# Patient Record
Sex: Female | Born: 1945 | Race: Black or African American | Hispanic: No | Smoking: Current some day smoker
Health system: Southern US, Community
[De-identification: ages and names within clinical notes are randomized; demographics above are authoritative.]

## PROBLEM LIST (undated history)

## (undated) DIAGNOSIS — L0390 Cellulitis, unspecified: Secondary | ICD-10-CM

## (undated) DIAGNOSIS — M7989 Other specified soft tissue disorders: Secondary | ICD-10-CM

## (undated) DIAGNOSIS — I509 Heart failure, unspecified: Secondary | ICD-10-CM

## (undated) DIAGNOSIS — M549 Dorsalgia, unspecified: Secondary | ICD-10-CM

## (undated) DIAGNOSIS — I5032 Chronic diastolic (congestive) heart failure: Secondary | ICD-10-CM

## (undated) DIAGNOSIS — K2100 Gastro-esophageal reflux disease with esophagitis, without bleeding: Secondary | ICD-10-CM

## (undated) DIAGNOSIS — I739 Peripheral vascular disease, unspecified: Secondary | ICD-10-CM

## (undated) DIAGNOSIS — Z8719 Personal history of other diseases of the digestive system: Secondary | ICD-10-CM

## (undated) DIAGNOSIS — M109 Gout, unspecified: Secondary | ICD-10-CM

## (undated) DIAGNOSIS — K219 Gastro-esophageal reflux disease without esophagitis: Secondary | ICD-10-CM

## (undated) DIAGNOSIS — Z5189 Encounter for other specified aftercare: Secondary | ICD-10-CM

## (undated) DIAGNOSIS — I10 Essential (primary) hypertension: Secondary | ICD-10-CM

## (undated) DIAGNOSIS — J449 Chronic obstructive pulmonary disease, unspecified: Secondary | ICD-10-CM

## (undated) DIAGNOSIS — D649 Anemia, unspecified: Secondary | ICD-10-CM

## (undated) DIAGNOSIS — J189 Pneumonia, unspecified organism: Secondary | ICD-10-CM

## (undated) DIAGNOSIS — D374 Neoplasm of uncertain behavior of colon: Secondary | ICD-10-CM

## (undated) DIAGNOSIS — K759 Inflammatory liver disease, unspecified: Secondary | ICD-10-CM

## (undated) DIAGNOSIS — F419 Anxiety disorder, unspecified: Secondary | ICD-10-CM

## (undated) DIAGNOSIS — IMO0001 Reserved for inherently not codable concepts without codable children: Secondary | ICD-10-CM

## (undated) DIAGNOSIS — K635 Polyp of colon: Secondary | ICD-10-CM

## (undated) DIAGNOSIS — F329 Major depressive disorder, single episode, unspecified: Secondary | ICD-10-CM

## (undated) DIAGNOSIS — I824Y9 Acute embolism and thrombosis of unspecified deep veins of unspecified proximal lower extremity: Secondary | ICD-10-CM

## (undated) DIAGNOSIS — M1990 Unspecified osteoarthritis, unspecified site: Secondary | ICD-10-CM

## (undated) DIAGNOSIS — C221 Intrahepatic bile duct carcinoma: Secondary | ICD-10-CM

## (undated) DIAGNOSIS — K922 Gastrointestinal hemorrhage, unspecified: Secondary | ICD-10-CM

## (undated) DIAGNOSIS — F32A Depression, unspecified: Secondary | ICD-10-CM

## (undated) DIAGNOSIS — Z9981 Dependence on supplemental oxygen: Secondary | ICD-10-CM

## (undated) DIAGNOSIS — K7030 Alcoholic cirrhosis of liver without ascites: Secondary | ICD-10-CM

## (undated) HISTORY — DX: Heart failure, unspecified: I50.9

## (undated) HISTORY — DX: Anemia, unspecified: D64.9

## (undated) HISTORY — DX: Cellulitis, unspecified: L03.90

## (undated) HISTORY — DX: Major depressive disorder, single episode, unspecified: F32.9

## (undated) HISTORY — DX: Other specified soft tissue disorders: M79.89

## (undated) HISTORY — DX: Gastro-esophageal reflux disease with esophagitis: K21.0

## (undated) HISTORY — DX: Gastro-esophageal reflux disease without esophagitis: K21.9

## (undated) HISTORY — PX: ABDOMINAL HYSTERECTOMY: SHX81

## (undated) HISTORY — DX: Gout, unspecified: M10.9

## (undated) HISTORY — DX: Neoplasm of uncertain behavior of colon: D37.4

## (undated) HISTORY — DX: Acute embolism and thrombosis of unspecified deep veins of unspecified proximal lower extremity: I82.4Y9

## (undated) HISTORY — PX: COLONOSCOPY: SHX174

## (undated) HISTORY — DX: Depression, unspecified: F32.A

## (undated) HISTORY — DX: Reserved for inherently not codable concepts without codable children: IMO0001

## (undated) HISTORY — DX: Personal history of other diseases of the digestive system: Z87.19

## (undated) HISTORY — DX: Essential (primary) hypertension: I10

## (undated) HISTORY — DX: Unspecified osteoarthritis, unspecified site: M19.90

## (undated) HISTORY — DX: Chronic obstructive pulmonary disease, unspecified: J44.9

## (undated) HISTORY — DX: Encounter for other specified aftercare: Z51.89

## (undated) HISTORY — DX: Dorsalgia, unspecified: M54.9

## (undated) HISTORY — DX: Anxiety disorder, unspecified: F41.9

## (undated) HISTORY — DX: Alcoholic cirrhosis of liver without ascites: K70.30

## (undated) HISTORY — DX: Peripheral vascular disease, unspecified: I73.9

## (undated) HISTORY — DX: Gastro-esophageal reflux disease with esophagitis, without bleeding: K21.00

## (undated) HISTORY — DX: Pneumonia, unspecified organism: J18.9

## (undated) HISTORY — PX: CATARACT EXTRACTION: SUR2

## (undated) HISTORY — DX: Polyp of colon: K63.5

---
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
## 1999-05-05 ENCOUNTER — Encounter: Admission: RE | Admit: 1999-05-05 | Discharge: 1999-06-08 | Payer: Self-pay | Admitting: Family Medicine

## 2000-04-26 ENCOUNTER — Encounter: Admission: RE | Admit: 2000-04-26 | Discharge: 2000-04-26 | Payer: Self-pay | Admitting: *Deleted

## 2000-05-05 ENCOUNTER — Ambulatory Visit (HOSPITAL_COMMUNITY): Admission: RE | Admit: 2000-05-05 | Discharge: 2000-05-05 | Payer: Self-pay | Admitting: Internal Medicine

## 2000-05-05 ENCOUNTER — Encounter: Payer: Self-pay | Admitting: Internal Medicine

## 2000-11-19 ENCOUNTER — Inpatient Hospital Stay (HOSPITAL_COMMUNITY): Admission: EM | Admit: 2000-11-19 | Discharge: 2000-11-23 | Payer: Self-pay

## 2001-01-26 ENCOUNTER — Encounter: Payer: Self-pay | Admitting: Emergency Medicine

## 2001-01-26 ENCOUNTER — Inpatient Hospital Stay (HOSPITAL_COMMUNITY): Admission: EM | Admit: 2001-01-26 | Discharge: 2001-01-30 | Payer: Self-pay | Admitting: Emergency Medicine

## 2001-01-27 ENCOUNTER — Encounter: Payer: Self-pay | Admitting: Internal Medicine

## 2001-01-28 ENCOUNTER — Encounter: Payer: Self-pay | Admitting: Internal Medicine

## 2001-01-29 ENCOUNTER — Encounter: Payer: Self-pay | Admitting: Internal Medicine

## 2001-01-30 ENCOUNTER — Encounter: Payer: Self-pay | Admitting: Emergency Medicine

## 2002-01-06 ENCOUNTER — Emergency Department (HOSPITAL_COMMUNITY): Admission: EM | Admit: 2002-01-06 | Discharge: 2002-01-06 | Payer: Self-pay | Admitting: Emergency Medicine

## 2002-06-11 ENCOUNTER — Ambulatory Visit (HOSPITAL_COMMUNITY): Admission: RE | Admit: 2002-06-11 | Discharge: 2002-06-11 | Payer: Self-pay | Admitting: Family Medicine

## 2002-06-11 ENCOUNTER — Encounter: Payer: Self-pay | Admitting: Family Medicine

## 2002-08-01 ENCOUNTER — Encounter: Admission: RE | Admit: 2002-08-01 | Discharge: 2002-09-03 | Payer: Self-pay | Admitting: Internal Medicine

## 2002-09-10 ENCOUNTER — Encounter: Admission: RE | Admit: 2002-09-10 | Discharge: 2002-12-09 | Payer: Self-pay | Admitting: Internal Medicine

## 2002-10-31 ENCOUNTER — Encounter: Payer: Self-pay | Admitting: Internal Medicine

## 2002-10-31 ENCOUNTER — Inpatient Hospital Stay (HOSPITAL_COMMUNITY): Admission: AD | Admit: 2002-10-31 | Discharge: 2002-11-08 | Payer: Self-pay | Admitting: Internal Medicine

## 2002-11-04 ENCOUNTER — Encounter: Payer: Self-pay | Admitting: Internal Medicine

## 2002-11-04 ENCOUNTER — Encounter (INDEPENDENT_AMBULATORY_CARE_PROVIDER_SITE_OTHER): Payer: Self-pay | Admitting: *Deleted

## 2003-04-26 ENCOUNTER — Encounter: Payer: Self-pay | Admitting: Emergency Medicine

## 2003-04-26 ENCOUNTER — Emergency Department (HOSPITAL_COMMUNITY): Admission: EM | Admit: 2003-04-26 | Discharge: 2003-04-26 | Payer: Self-pay | Admitting: Emergency Medicine

## 2003-05-14 ENCOUNTER — Inpatient Hospital Stay (HOSPITAL_COMMUNITY): Admission: EM | Admit: 2003-05-14 | Discharge: 2003-05-19 | Payer: Self-pay | Admitting: Emergency Medicine

## 2003-05-16 ENCOUNTER — Encounter: Payer: Self-pay | Admitting: Internal Medicine

## 2003-06-03 ENCOUNTER — Emergency Department (HOSPITAL_COMMUNITY): Admission: EM | Admit: 2003-06-03 | Discharge: 2003-06-04 | Payer: Self-pay | Admitting: Emergency Medicine

## 2003-06-05 ENCOUNTER — Emergency Department (HOSPITAL_COMMUNITY): Admission: EM | Admit: 2003-06-05 | Discharge: 2003-06-05 | Payer: Self-pay | Admitting: Emergency Medicine

## 2003-06-05 ENCOUNTER — Encounter: Payer: Self-pay | Admitting: Family Medicine

## 2003-07-02 ENCOUNTER — Emergency Department (HOSPITAL_COMMUNITY): Admission: EM | Admit: 2003-07-02 | Discharge: 2003-07-03 | Payer: Self-pay | Admitting: Emergency Medicine

## 2003-09-07 ENCOUNTER — Emergency Department (HOSPITAL_COMMUNITY): Admission: EM | Admit: 2003-09-07 | Discharge: 2003-09-07 | Payer: Self-pay | Admitting: Emergency Medicine

## 2003-09-08 ENCOUNTER — Inpatient Hospital Stay (HOSPITAL_COMMUNITY): Admission: EM | Admit: 2003-09-08 | Discharge: 2003-09-11 | Payer: Self-pay | Admitting: Emergency Medicine

## 2004-06-17 ENCOUNTER — Encounter: Admission: RE | Admit: 2004-06-17 | Discharge: 2004-06-17 | Payer: Self-pay | Admitting: Gastroenterology

## 2004-09-16 ENCOUNTER — Encounter: Admission: RE | Admit: 2004-09-16 | Discharge: 2004-09-16 | Payer: Self-pay | Admitting: Internal Medicine

## 2005-10-18 ENCOUNTER — Encounter: Admission: RE | Admit: 2005-10-18 | Discharge: 2005-10-18 | Payer: Self-pay | Admitting: Internal Medicine

## 2006-06-25 ENCOUNTER — Emergency Department (HOSPITAL_COMMUNITY): Admission: EM | Admit: 2006-06-25 | Discharge: 2006-06-25 | Payer: Self-pay | Admitting: Emergency Medicine

## 2006-08-23 ENCOUNTER — Emergency Department (HOSPITAL_COMMUNITY): Admission: EM | Admit: 2006-08-23 | Discharge: 2006-08-23 | Payer: Self-pay | Admitting: Emergency Medicine

## 2006-10-20 ENCOUNTER — Encounter: Admission: RE | Admit: 2006-10-20 | Discharge: 2006-10-20 | Payer: Self-pay | Admitting: Internal Medicine

## 2007-02-04 ENCOUNTER — Inpatient Hospital Stay (HOSPITAL_COMMUNITY): Admission: EM | Admit: 2007-02-04 | Discharge: 2007-02-22 | Payer: Self-pay | Admitting: *Deleted

## 2007-02-12 ENCOUNTER — Ambulatory Visit: Payer: Self-pay | Admitting: Internal Medicine

## 2007-03-06 ENCOUNTER — Inpatient Hospital Stay (HOSPITAL_COMMUNITY): Admission: EM | Admit: 2007-03-06 | Discharge: 2007-03-09 | Payer: Self-pay | Admitting: Emergency Medicine

## 2007-04-17 ENCOUNTER — Inpatient Hospital Stay (HOSPITAL_COMMUNITY): Admission: EM | Admit: 2007-04-17 | Discharge: 2007-04-27 | Payer: Self-pay | Admitting: Emergency Medicine

## 2007-08-30 DIAGNOSIS — I739 Peripheral vascular disease, unspecified: Secondary | ICD-10-CM

## 2007-08-30 HISTORY — DX: Peripheral vascular disease, unspecified: I73.9

## 2007-10-24 ENCOUNTER — Encounter: Admission: RE | Admit: 2007-10-24 | Discharge: 2007-10-24 | Payer: Self-pay | Admitting: Internal Medicine

## 2008-03-10 ENCOUNTER — Inpatient Hospital Stay (HOSPITAL_COMMUNITY): Admission: RE | Admit: 2008-03-10 | Discharge: 2008-03-16 | Payer: Self-pay | Admitting: Cardiology

## 2008-03-14 ENCOUNTER — Encounter (INDEPENDENT_AMBULATORY_CARE_PROVIDER_SITE_OTHER): Payer: Self-pay | Admitting: Gastroenterology

## 2008-05-06 ENCOUNTER — Inpatient Hospital Stay (HOSPITAL_COMMUNITY): Admission: EM | Admit: 2008-05-06 | Discharge: 2008-05-12 | Payer: Self-pay | Admitting: Emergency Medicine

## 2008-05-07 ENCOUNTER — Encounter (INDEPENDENT_AMBULATORY_CARE_PROVIDER_SITE_OTHER): Payer: Self-pay | Admitting: Gastroenterology

## 2008-09-19 ENCOUNTER — Inpatient Hospital Stay (HOSPITAL_COMMUNITY): Admission: EM | Admit: 2008-09-19 | Discharge: 2008-09-23 | Payer: Self-pay | Admitting: Emergency Medicine

## 2008-11-05 ENCOUNTER — Encounter: Admission: RE | Admit: 2008-11-05 | Discharge: 2008-11-05 | Payer: Self-pay | Admitting: Internal Medicine

## 2008-11-26 ENCOUNTER — Emergency Department (HOSPITAL_COMMUNITY): Admission: EM | Admit: 2008-11-26 | Discharge: 2008-11-26 | Payer: Self-pay | Admitting: Emergency Medicine

## 2008-11-27 ENCOUNTER — Emergency Department (HOSPITAL_COMMUNITY): Admission: EM | Admit: 2008-11-27 | Discharge: 2008-11-27 | Payer: Self-pay | Admitting: Family Medicine

## 2009-11-06 ENCOUNTER — Encounter: Admission: RE | Admit: 2009-11-06 | Discharge: 2009-11-06 | Payer: Self-pay | Admitting: Internal Medicine

## 2009-11-18 ENCOUNTER — Encounter: Admission: RE | Admit: 2009-11-18 | Discharge: 2009-11-18 | Payer: Self-pay | Admitting: Internal Medicine

## 2010-04-07 ENCOUNTER — Ambulatory Visit: Payer: Self-pay | Admitting: Family Medicine

## 2010-04-07 ENCOUNTER — Inpatient Hospital Stay (HOSPITAL_COMMUNITY): Admission: EM | Admit: 2010-04-07 | Discharge: 2010-04-09 | Payer: Self-pay | Admitting: Emergency Medicine

## 2010-04-19 ENCOUNTER — Encounter: Payer: Self-pay | Admitting: Family Medicine

## 2010-04-30 ENCOUNTER — Encounter: Admission: RE | Admit: 2010-04-30 | Discharge: 2010-04-30 | Payer: Self-pay | Admitting: Internal Medicine

## 2010-05-13 ENCOUNTER — Inpatient Hospital Stay (HOSPITAL_COMMUNITY): Admission: EM | Admit: 2010-05-13 | Discharge: 2010-05-15 | Payer: Self-pay | Admitting: Emergency Medicine

## 2010-07-04 ENCOUNTER — Emergency Department (HOSPITAL_COMMUNITY): Admission: EM | Admit: 2010-07-04 | Discharge: 2010-07-04 | Payer: Self-pay | Admitting: Emergency Medicine

## 2010-09-18 ENCOUNTER — Encounter: Payer: Self-pay | Admitting: Gastroenterology

## 2010-09-20 ENCOUNTER — Emergency Department (HOSPITAL_COMMUNITY)
Admission: EM | Admit: 2010-09-20 | Discharge: 2010-09-20 | Payer: Self-pay | Source: Home / Self Care | Admitting: Emergency Medicine

## 2010-09-20 ENCOUNTER — Inpatient Hospital Stay (HOSPITAL_COMMUNITY)
Admission: EM | Admit: 2010-09-20 | Discharge: 2010-09-20 | Payer: Self-pay | Source: Home / Self Care | Attending: Pulmonary Disease | Admitting: Pulmonary Disease

## 2010-09-20 DIAGNOSIS — J9600 Acute respiratory failure, unspecified whether with hypoxia or hypercapnia: Secondary | ICD-10-CM

## 2010-09-20 DIAGNOSIS — E1010 Type 1 diabetes mellitus with ketoacidosis without coma: Secondary | ICD-10-CM

## 2010-09-20 DIAGNOSIS — A419 Sepsis, unspecified organism: Secondary | ICD-10-CM

## 2010-09-20 DIAGNOSIS — E872 Acidosis: Secondary | ICD-10-CM

## 2010-09-21 LAB — POCT I-STAT 3, ART BLOOD GAS (G3+)
Acid-base deficit: 21 mmol/L — ABNORMAL HIGH (ref 0.0–2.0)
Acid-base deficit: 15 mmol/L — ABNORMAL HIGH (ref 0.0–2.0)
Bicarbonate: 9.2 mEq/L — ABNORMAL LOW (ref 20.0–24.0)
Bicarbonate: 12.9 mEq/L — ABNORMAL LOW (ref 20.0–24.0)
O2 Saturation: 98 %
Patient temperature: 98.3
TCO2: 14 mmol/L (ref 0–100)
pCO2 arterial: 35 mmHg (ref 35.0–45.0)
pO2, Arterial: 150 mmHg — ABNORMAL HIGH (ref 80.0–100.0)

## 2010-09-21 LAB — CBC
HCT: 39.5 % (ref 36.0–46.0)
HCT: 34.9 % — ABNORMAL LOW (ref 36.0–46.0)
Hemoglobin: 12 g/dL (ref 12.0–15.0)
Hemoglobin: 11.3 g/dL — ABNORMAL LOW (ref 12.0–15.0)
MCH: 28 pg (ref 26.0–34.0)
MCHC: 30.4 g/dL (ref 30.0–36.0)
MCHC: 32.4 g/dL (ref 30.0–36.0)
MCV: 89.7 fL (ref 78.0–100.0)
RDW: 16 % — ABNORMAL HIGH (ref 11.5–15.5)
RDW: 15.6 % — ABNORMAL HIGH (ref 11.5–15.5)

## 2010-09-21 LAB — APTT: aPTT: 23 seconds — ABNORMAL LOW (ref 24–37)

## 2010-09-21 LAB — DIFFERENTIAL
Eosinophils Relative: 0 % (ref 0–5)
Eosinophils Relative: 0 % (ref 0–5)
Lymphocytes Relative: 10 % — ABNORMAL LOW (ref 12–46)
Lymphs Abs: 1.5 10*3/uL (ref 0.7–4.0)
Lymphs Abs: 2.6 10*3/uL (ref 0.7–4.0)
Monocytes Absolute: 2 10*3/uL — ABNORMAL HIGH (ref 0.1–1.0)
Monocytes Relative: 4 % (ref 3–12)
Neutro Abs: 20.9 10*3/uL — ABNORMAL HIGH (ref 1.7–7.7)
Neutro Abs: 21.9 10*3/uL — ABNORMAL HIGH (ref 1.7–7.7)

## 2010-09-21 LAB — PROCALCITONIN: Procalcitonin: 13.86 ng/mL

## 2010-09-21 LAB — POCT I-STAT, CHEM 8
Calcium, Ion: 1.1 mmol/L — ABNORMAL LOW (ref 1.12–1.32)
Creatinine, Ser: 1.5 mg/dL — ABNORMAL HIGH (ref 0.4–1.2)
Glucose, Bld: >700 mg/dL (ref 70–99)
HCT: 42 % (ref 36.0–46.0)
Hemoglobin: 14.3 g/dL (ref 12.0–15.0)
Potassium: 5.4 mEq/L — ABNORMAL HIGH (ref 3.5–5.1)
TCO2: 9 mmol/L (ref 0–100)

## 2010-09-21 LAB — HEPATIC FUNCTION PANEL
ALT: 11 U/L (ref 0–35)
AST: 17 U/L (ref 0–37)
Bilirubin, Direct: 0.2 mg/dL (ref 0.0–0.3)
Indirect Bilirubin: 1.5 mg/dL — ABNORMAL HIGH (ref 0.3–0.9)
Total Bilirubin: 1.7 mg/dL — ABNORMAL HIGH (ref 0.3–1.2)

## 2010-09-21 LAB — URINE MICROSCOPIC-ADD ON

## 2010-09-21 LAB — URINALYSIS, ROUTINE W REFLEX MICROSCOPIC
Bilirubin Urine: NEGATIVE
Leukocytes, UA: NEGATIVE
Nitrite: NEGATIVE
Specific Gravity, Urine: 1.027 (ref 1.005–1.030)
Urobilinogen, UA: 0.2 mg/dL (ref 0.0–1.0)
pH: 5 (ref 5.0–8.0)

## 2010-09-21 LAB — COMPREHENSIVE METABOLIC PANEL
ALT: 10 U/L (ref 0–35)
CO2: 14 mEq/L — ABNORMAL LOW (ref 19–32)
Calcium: 8.1 mg/dL — ABNORMAL LOW (ref 8.4–10.5)
Chloride: 102 mEq/L (ref 96–112)
GFR calc non Af Amer: 24 mL/min — ABNORMAL LOW (ref >60)
Glucose, Bld: 749 mg/dL (ref 70–99)
Sodium: 136 mEq/L (ref 135–145)
Total Bilirubin: 1.2 mg/dL (ref 0.3–1.2)

## 2010-09-21 LAB — LACTIC ACID, PLASMA: Lactic Acid, Venous: 6.6 mmol/L — ABNORMAL HIGH (ref 0.5–2.2)

## 2010-09-21 LAB — GLUCOSE, CAPILLARY: Glucose-Capillary: >600 mg/dL (ref 70–99)

## 2010-09-21 LAB — MRSA PCR SCREENING: MRSA by PCR: NEGATIVE

## 2010-09-21 LAB — GLUCOSE, RANDOM: Glucose, Bld: 855 mg/dL (ref 70–99)

## 2010-09-21 LAB — AMYLASE: Amylase: 45 U/L (ref 0–105)

## 2010-09-21 LAB — POCT CARDIAC MARKERS: Myoglobin, poc: 151 ng/mL (ref 12–200)

## 2010-09-21 LAB — PROTIME-INR: Prothrombin Time: 15.1 seconds (ref 11.6–15.2)

## 2010-09-22 ENCOUNTER — Other Ambulatory Visit: Payer: Self-pay | Admitting: Internal Medicine

## 2010-09-22 DIAGNOSIS — R921 Mammographic calcification found on diagnostic imaging of breast: Secondary | ICD-10-CM

## 2010-09-22 LAB — URINE CULTURE
Colony Count: NO GROWTH
Culture  Setup Time: 201201231255
Culture: NO GROWTH

## 2010-09-22 LAB — CARBOXYHEMOGLOBIN: Total hemoglobin: 11 g/dL — ABNORMAL LOW (ref 12.5–16.0)

## 2010-09-22 LAB — GLUCOSE, CAPILLARY: Glucose-Capillary: >600 mg/dL (ref 70–99)

## 2010-09-23 LAB — CULTURE, RESPIRATORY W GRAM STAIN

## 2010-09-26 LAB — CULTURE, BLOOD (ROUTINE X 2)
Culture  Setup Time: 201201231801
Culture  Setup Time: 201201231801
Culture: NO GROWTH

## 2010-11-09 LAB — URINE CULTURE: Colony Count: 70000

## 2010-11-09 LAB — CBC
Hemoglobin: 11.5 g/dL — ABNORMAL LOW (ref 12.0–15.0)
MCHC: 31.9 g/dL (ref 30.0–36.0)
RBC: 4.52 MIL/uL (ref 3.87–5.11)

## 2010-11-09 LAB — DIFFERENTIAL
Basophils Relative: 0 % (ref 0–1)
Monocytes Relative: 6 % (ref 3–12)
Neutro Abs: 11.3 10*3/uL — ABNORMAL HIGH (ref 1.7–7.7)
Neutrophils Relative %: 76 % (ref 43–77)

## 2010-11-09 LAB — POCT CARDIAC MARKERS
Myoglobin, poc: 159 ng/mL (ref 12–200)
Troponin i, poc: <0.05 ng/mL (ref 0.00–0.09)

## 2010-11-09 LAB — POCT I-STAT, CHEM 8
Chloride: 105 mEq/L (ref 96–112)
Glucose, Bld: 194 mg/dL — ABNORMAL HIGH (ref 70–99)
HCT: 39 % (ref 36.0–46.0)
Potassium: 4.7 mEq/L (ref 3.5–5.1)
Sodium: 138 mEq/L (ref 135–145)

## 2010-11-09 LAB — URINALYSIS, ROUTINE W REFLEX MICROSCOPIC
Bilirubin Urine: NEGATIVE
Hgb urine dipstick: NEGATIVE
Protein, ur: NEGATIVE mg/dL
Urobilinogen, UA: 0.2 mg/dL (ref 0.0–1.0)

## 2010-11-10 ENCOUNTER — Ambulatory Visit
Admission: RE | Admit: 2010-11-10 | Discharge: 2010-11-10 | Disposition: A | Discharge status: Home / Self Care | Payer: PRIVATE HEALTH INSURANCE | Source: Ambulatory Visit | Attending: Internal Medicine | Admitting: Internal Medicine

## 2010-11-10 DIAGNOSIS — R921 Mammographic calcification found on diagnostic imaging of breast: Secondary | ICD-10-CM

## 2010-11-11 LAB — CBC
HCT: 31.3 % — ABNORMAL LOW (ref 36.0–46.0)
HCT: 35.5 % — ABNORMAL LOW (ref 36.0–46.0)
HCT: 35.9 % — ABNORMAL LOW (ref 36.0–46.0)
Hemoglobin: 11.2 g/dL — ABNORMAL LOW (ref 12.0–15.0)
Hemoglobin: 9.7 g/dL — ABNORMAL LOW (ref 12.0–15.0)
MCH: 25 pg — ABNORMAL LOW (ref 26.0–34.0)
MCH: 25.6 pg — ABNORMAL LOW (ref 26.0–34.0)
MCV: 80.9 fL (ref 78.0–100.0)
MCV: 81.9 fL (ref 78.0–100.0)
MCV: 82.2 fL (ref 78.0–100.0)
Platelets: 277 10*3/uL (ref 150–400)
RBC: 3.32 MIL/uL — ABNORMAL LOW (ref 3.87–5.11)
RBC: 4.37 MIL/uL (ref 3.87–5.11)
RDW: 14.4 % (ref 11.5–15.5)
RDW: 14.5 % (ref 11.5–15.5)
WBC: 12 10*3/uL — ABNORMAL HIGH (ref 4.0–10.5)
WBC: 14.7 10*3/uL — ABNORMAL HIGH (ref 4.0–10.5)
WBC: 15.8 10*3/uL — ABNORMAL HIGH (ref 4.0–10.5)
WBC: 8.7 10*3/uL (ref 4.0–10.5)

## 2010-11-11 LAB — DIFFERENTIAL
Basophils Absolute: 0 10*3/uL (ref 0.0–0.1)
Basophils Relative: 0 % (ref 0–1)
Eosinophils Absolute: 0.1 10*3/uL (ref 0.0–0.7)
Monocytes Relative: 8 % (ref 3–12)
Neutro Abs: 11.7 10*3/uL — ABNORMAL HIGH (ref 1.7–7.7)
Neutrophils Relative %: 80 % — ABNORMAL HIGH (ref 43–77)

## 2010-11-11 LAB — BRAIN NATRIURETIC PEPTIDE: Pro B Natriuretic peptide (BNP): <30 pg/mL (ref 0.0–100.0)

## 2010-11-11 LAB — GLUCOSE, CAPILLARY
Glucose-Capillary: 100 mg/dL — ABNORMAL HIGH (ref 70–99)
Glucose-Capillary: 118 mg/dL — ABNORMAL HIGH (ref 70–99)
Glucose-Capillary: 152 mg/dL — ABNORMAL HIGH (ref 70–99)
Glucose-Capillary: 203 mg/dL — ABNORMAL HIGH (ref 70–99)
Glucose-Capillary: 92 mg/dL (ref 70–99)

## 2010-11-11 LAB — COMPREHENSIVE METABOLIC PANEL
ALT: 10 U/L (ref 0–35)
Alkaline Phosphatase: 86 U/L (ref 39–117)
BUN: 29 mg/dL — ABNORMAL HIGH (ref 6–23)
CO2: 26 mEq/L (ref 19–32)
Chloride: 104 mEq/L (ref 96–112)
GFR calc non Af Amer: 38 mL/min — ABNORMAL LOW (ref >60)
Glucose, Bld: 213 mg/dL — ABNORMAL HIGH (ref 70–99)
Potassium: 5.4 mEq/L — ABNORMAL HIGH (ref 3.5–5.1)
Sodium: 139 mEq/L (ref 135–145)
Total Bilirubin: 1.3 mg/dL — ABNORMAL HIGH (ref 0.3–1.2)

## 2010-11-11 LAB — TSH: TSH: 1.233 u[IU]/mL (ref 0.350–4.500)

## 2010-11-11 LAB — BASIC METABOLIC PANEL
BUN: 15 mg/dL (ref 6–23)
CO2: 22 mEq/L (ref 19–32)
Calcium: 8.3 mg/dL — ABNORMAL LOW (ref 8.4–10.5)
Chloride: 107 mEq/L (ref 96–112)
Chloride: 110 mEq/L (ref 96–112)
Creatinine, Ser: 1.15 mg/dL (ref 0.4–1.2)
GFR calc Af Amer: 57 mL/min — ABNORMAL LOW (ref >60)
GFR calc non Af Amer: 45 mL/min — ABNORMAL LOW (ref >60)
Potassium: 4.2 mEq/L (ref 3.5–5.1)
Sodium: 141 mEq/L (ref 135–145)

## 2010-11-11 LAB — TROPONIN I: Troponin I: 0.02 ng/mL (ref 0.00–0.06)

## 2010-11-11 LAB — PROTIME-INR: Prothrombin Time: 13.4 seconds (ref 11.6–15.2)

## 2010-11-11 LAB — HEMOCCULT GUIAC POC 1CARD (OFFICE): Fecal Occult Bld: NEGATIVE

## 2010-11-11 LAB — LIPASE, BLOOD: Lipase: 16 U/L (ref 11–59)

## 2010-11-11 LAB — TYPE AND SCREEN: Antibody Screen: POSITIVE

## 2010-11-12 LAB — COMPREHENSIVE METABOLIC PANEL
AST: 16 U/L (ref 0–37)
Albumin: 2.5 g/dL — ABNORMAL LOW (ref 3.5–5.2)
Alkaline Phosphatase: 60 U/L (ref 39–117)
BUN: 17 mg/dL (ref 6–23)
CO2: 21 mEq/L (ref 19–32)
Calcium: 9.2 mg/dL (ref 8.4–10.5)
Chloride: 108 mEq/L (ref 96–112)
Chloride: 98 mEq/L (ref 96–112)
Creatinine, Ser: 1.29 mg/dL — ABNORMAL HIGH (ref 0.4–1.2)
GFR calc Af Amer: >60 mL/min (ref >60)
GFR calc non Af Amer: 41 mL/min — ABNORMAL LOW (ref >60)
Glucose, Bld: 251 mg/dL — ABNORMAL HIGH (ref 70–99)
Potassium: 4 mEq/L (ref 3.5–5.1)
Sodium: 139 mEq/L (ref 135–145)
Total Bilirubin: 0.3 mg/dL (ref 0.3–1.2)
Total Bilirubin: 0.7 mg/dL (ref 0.3–1.2)

## 2010-11-12 LAB — CBC
HCT: 31.5 % — ABNORMAL LOW (ref 36.0–46.0)
Hemoglobin: 10 g/dL — ABNORMAL LOW (ref 12.0–15.0)
Hemoglobin: 11.3 g/dL — ABNORMAL LOW (ref 12.0–15.0)
MCH: 25.9 pg — ABNORMAL LOW (ref 26.0–34.0)
MCH: 25.9 pg — ABNORMAL LOW (ref 26.0–34.0)
MCH: 26.1 pg (ref 26.0–34.0)
MCHC: 31 g/dL (ref 30.0–36.0)
MCHC: 31.3 g/dL (ref 30.0–36.0)
MCHC: 31.9 g/dL (ref 30.0–36.0)
MCV: 81 fL (ref 78.0–100.0)
MCV: 82.2 fL (ref 78.0–100.0)
MCV: 82.5 fL (ref 78.0–100.0)
MCV: 83.7 fL (ref 78.0–100.0)
Platelets: 213 10*3/uL (ref 150–400)
Platelets: 354 10*3/uL (ref 150–400)
Platelets: 358 10*3/uL (ref 150–400)
Platelets: 360 10*3/uL (ref 150–400)
RBC: 3.45 MIL/uL — ABNORMAL LOW (ref 3.87–5.11)
RBC: 3.83 MIL/uL — ABNORMAL LOW (ref 3.87–5.11)
RBC: 4.37 MIL/uL (ref 3.87–5.11)
RDW: 14.2 % (ref 11.5–15.5)
RDW: 14.3 % (ref 11.5–15.5)
RDW: 14.4 % (ref 11.5–15.5)
WBC: 10.8 10*3/uL — ABNORMAL HIGH (ref 4.0–10.5)
WBC: 11.4 10*3/uL — ABNORMAL HIGH (ref 4.0–10.5)
WBC: 17.3 10*3/uL — ABNORMAL HIGH (ref 4.0–10.5)

## 2010-11-12 LAB — CARDIAC PANEL(CRET KIN+CKTOT+MB+TROPI)
CK, MB: 1.8 ng/mL (ref 0.3–4.0)
CK, MB: 2.1 ng/mL (ref 0.3–4.0)
Total CK: 76 U/L (ref 7–177)
Troponin I: 0.01 ng/mL (ref 0.00–0.06)

## 2010-11-12 LAB — GLUCOSE, CAPILLARY: Glucose-Capillary: 109 mg/dL — ABNORMAL HIGH (ref 70–99)

## 2010-11-12 LAB — CROSSMATCH: Donor AG Type: NEGATIVE

## 2010-11-12 LAB — BASIC METABOLIC PANEL
Chloride: 105 mEq/L (ref 96–112)
GFR calc Af Amer: 54 mL/min — ABNORMAL LOW (ref >60)
Potassium: 4.7 mEq/L (ref 3.5–5.1)

## 2010-11-12 LAB — CK TOTAL AND CKMB (NOT AT ARMC): CK, MB: 1.4 ng/mL (ref 0.3–4.0)

## 2010-11-12 LAB — LIPASE, BLOOD: Lipase: 16 U/L (ref 11–59)

## 2010-11-12 LAB — PROTIME-INR
INR: 1.05 (ref 0.00–1.49)
Prothrombin Time: 13.9 seconds (ref 11.6–15.2)

## 2010-11-12 LAB — DIFFERENTIAL
Basophils Absolute: 0 10*3/uL (ref 0.0–0.1)
Eosinophils Relative: 0 % (ref 0–5)
Lymphocytes Relative: 9 % — ABNORMAL LOW (ref 12–46)
Neutro Abs: 16.7 10*3/uL — ABNORMAL HIGH (ref 1.7–7.7)
Neutrophils Relative %: 85 % — ABNORMAL HIGH (ref 43–77)

## 2010-11-12 LAB — APTT: aPTT: 28 seconds (ref 24–37)

## 2010-11-12 LAB — POCT CARDIAC MARKERS: Troponin i, poc: <0.05 ng/mL (ref 0.00–0.09)

## 2010-12-08 LAB — DIFFERENTIAL
Eosinophils Absolute: 0 10*3/uL (ref 0.0–0.7)
Eosinophils Relative: 0 % (ref 0–5)
Lymphs Abs: 1.5 10*3/uL (ref 0.7–4.0)
Monocytes Absolute: 1.1 10*3/uL — ABNORMAL HIGH (ref 0.1–1.0)

## 2010-12-08 LAB — POCT I-STAT 3, ART BLOOD GAS (G3+)
Acid-base deficit: 2 mmol/L (ref 0.0–2.0)
Bicarbonate: 22.1 mEq/L (ref 20.0–24.0)
O2 Saturation: 96 %
Patient temperature: 98.7
TCO2: 23 mmol/L (ref 0–100)

## 2010-12-08 LAB — POCT I-STAT, CHEM 8
Calcium, Ion: 1.04 mmol/L — ABNORMAL LOW (ref 1.12–1.32)
Creatinine, Ser: 1 mg/dL (ref 0.4–1.2)
Glucose, Bld: 106 mg/dL — ABNORMAL HIGH (ref 70–99)
Hemoglobin: 13.9 g/dL (ref 12.0–15.0)
Sodium: 138 mEq/L (ref 135–145)
TCO2: 19 mmol/L (ref 0–100)

## 2010-12-08 LAB — CBC
HCT: 38.5 % (ref 36.0–46.0)
MCV: 81.4 fL (ref 78.0–100.0)
Platelets: 369 10*3/uL (ref 150–400)
WBC: 16.8 10*3/uL — ABNORMAL HIGH (ref 4.0–10.5)

## 2010-12-08 LAB — D-DIMER, QUANTITATIVE: D-Dimer, Quant: 1.07 ug/mL-FEU — ABNORMAL HIGH (ref 0.00–0.48)

## 2010-12-09 LAB — APTT: aPTT: 24 seconds (ref 24–37)

## 2010-12-09 LAB — COMPREHENSIVE METABOLIC PANEL
ALT: 13 U/L (ref 0–35)
AST: 29 U/L (ref 0–37)
Albumin: 3.5 g/dL (ref 3.5–5.2)
Alkaline Phosphatase: 110 U/L (ref 39–117)
BUN: 20 mg/dL (ref 6–23)
CO2: 18 mEq/L — ABNORMAL LOW (ref 19–32)
Calcium: 9.2 mg/dL (ref 8.4–10.5)
Chloride: 111 mEq/L (ref 96–112)
Creatinine, Ser: 1.27 mg/dL — ABNORMAL HIGH (ref 0.4–1.2)
GFR calc Af Amer: 51 mL/min — ABNORMAL LOW (ref >60)
GFR calc non Af Amer: 42 mL/min — ABNORMAL LOW (ref >60)
Glucose, Bld: 167 mg/dL — ABNORMAL HIGH (ref 70–99)
Potassium: 4.5 mEq/L (ref 3.5–5.1)
Sodium: 139 mEq/L (ref 135–145)
Total Bilirubin: 0.7 mg/dL (ref 0.3–1.2)
Total Protein: 7.2 g/dL (ref 6.0–8.3)

## 2010-12-09 LAB — CBC
HCT: 38.7 % (ref 36.0–46.0)
Hemoglobin: 12.8 g/dL (ref 12.0–15.0)
MCHC: 33 g/dL (ref 30.0–36.0)
MCV: 81.2 fL (ref 78.0–100.0)
Platelets: 323 10*3/uL (ref 150–400)
RBC: 4.76 MIL/uL (ref 3.87–5.11)
RDW: 14.5 % (ref 11.5–15.5)
WBC: 15 10*3/uL — ABNORMAL HIGH (ref 4.0–10.5)

## 2010-12-09 LAB — DIFFERENTIAL
Basophils Absolute: 0.1 10*3/uL (ref 0.0–0.1)
Basophils Relative: 1 % (ref 0–1)
Lymphocytes Relative: 13 % (ref 12–46)
Monocytes Absolute: 0.8 10*3/uL (ref 0.1–1.0)
Monocytes Relative: 6 % (ref 3–12)
Neutro Abs: 12 10*3/uL — ABNORMAL HIGH (ref 1.7–7.7)
Neutrophils Relative %: 80 % — ABNORMAL HIGH (ref 43–77)

## 2010-12-09 LAB — PROTIME-INR
INR: 1 (ref 0.00–1.49)
Prothrombin Time: 13.8 seconds (ref 11.6–15.2)

## 2010-12-09 LAB — URINALYSIS, ROUTINE W REFLEX MICROSCOPIC
Bilirubin Urine: NEGATIVE
Glucose, UA: NEGATIVE mg/dL
Hgb urine dipstick: NEGATIVE
Specific Gravity, Urine: 1.028 (ref 1.005–1.030)
Urobilinogen, UA: 0.2 mg/dL (ref 0.0–1.0)

## 2010-12-09 LAB — POCT CARDIAC MARKERS: CKMB, poc: 1.7 ng/mL (ref 1.0–8.0)

## 2010-12-13 LAB — COMPREHENSIVE METABOLIC PANEL
ALT: 12 U/L (ref 0–35)
AST: 20 U/L (ref 0–37)
AST: 21 U/L (ref 0–37)
Albumin: 3.1 g/dL — ABNORMAL LOW (ref 3.5–5.2)
Alkaline Phosphatase: 92 U/L (ref 39–117)
BUN: 32 mg/dL — ABNORMAL HIGH (ref 6–23)
CO2: 21 mEq/L (ref 19–32)
Calcium: 9.6 mg/dL (ref 8.4–10.5)
Chloride: 104 mEq/L (ref 96–112)
Chloride: 104 mEq/L (ref 96–112)
Creatinine, Ser: 1.8 mg/dL — ABNORMAL HIGH (ref 0.4–1.2)
GFR calc non Af Amer: 28 mL/min — ABNORMAL LOW (ref >60)
Glucose, Bld: 108 mg/dL — ABNORMAL HIGH (ref 70–99)
Potassium: 4.4 mEq/L (ref 3.5–5.1)
Sodium: 133 mEq/L — ABNORMAL LOW (ref 135–145)
Total Bilirubin: 0.6 mg/dL (ref 0.3–1.2)
Total Protein: 6.9 g/dL (ref 6.0–8.3)

## 2010-12-13 LAB — GLUCOSE, CAPILLARY
Glucose-Capillary: 106 mg/dL — ABNORMAL HIGH (ref 70–99)
Glucose-Capillary: 109 mg/dL — ABNORMAL HIGH (ref 70–99)
Glucose-Capillary: 113 mg/dL — ABNORMAL HIGH (ref 70–99)
Glucose-Capillary: 126 mg/dL — ABNORMAL HIGH (ref 70–99)
Glucose-Capillary: 126 mg/dL — ABNORMAL HIGH (ref 70–99)
Glucose-Capillary: 140 mg/dL — ABNORMAL HIGH (ref 70–99)
Glucose-Capillary: 89 mg/dL (ref 70–99)
Glucose-Capillary: 90 mg/dL (ref 70–99)
Glucose-Capillary: 92 mg/dL (ref 70–99)

## 2010-12-13 LAB — URINALYSIS, ROUTINE W REFLEX MICROSCOPIC
Glucose, UA: NEGATIVE mg/dL
Protein, ur: NEGATIVE mg/dL
Specific Gravity, Urine: 1.014 (ref 1.005–1.030)
pH: 5.5 (ref 5.0–8.0)

## 2010-12-13 LAB — BASIC METABOLIC PANEL
BUN: 24 mg/dL — ABNORMAL HIGH (ref 6–23)
CO2: 16 mEq/L — ABNORMAL LOW (ref 19–32)
Calcium: 8.7 mg/dL (ref 8.4–10.5)
Calcium: 9 mg/dL (ref 8.4–10.5)
Chloride: 111 mEq/L (ref 96–112)
Creatinine, Ser: 1.85 mg/dL — ABNORMAL HIGH (ref 0.4–1.2)
GFR calc Af Amer: 29 mL/min — ABNORMAL LOW (ref >60)
GFR calc Af Amer: 33 mL/min — ABNORMAL LOW (ref >60)
GFR calc non Af Amer: 24 mL/min — ABNORMAL LOW (ref >60)
Glucose, Bld: 100 mg/dL — ABNORMAL HIGH (ref 70–99)
Potassium: 5.2 mEq/L — ABNORMAL HIGH (ref 3.5–5.1)
Sodium: 133 mEq/L — ABNORMAL LOW (ref 135–145)

## 2010-12-13 LAB — CULTURE, BLOOD (ROUTINE X 2)

## 2010-12-13 LAB — CBC
HCT: 28.1 % — ABNORMAL LOW (ref 36.0–46.0)
HCT: 31 % — ABNORMAL LOW (ref 36.0–46.0)
HCT: 33.9 % — ABNORMAL LOW (ref 36.0–46.0)
Hemoglobin: 10 g/dL — ABNORMAL LOW (ref 12.0–15.0)
Hemoglobin: 10.9 g/dL — ABNORMAL LOW (ref 12.0–15.0)
Hemoglobin: 9.4 g/dL — ABNORMAL LOW (ref 12.0–15.0)
Hemoglobin: 9.4 g/dL — ABNORMAL LOW (ref 12.0–15.0)
MCHC: 32 g/dL (ref 30.0–36.0)
MCV: 80.8 fL (ref 78.0–100.0)
MCV: 81.3 fL (ref 78.0–100.0)
MCV: 82.4 fL (ref 78.0–100.0)
Platelets: 342 10*3/uL (ref 150–400)
RBC: 3.35 MIL/uL — ABNORMAL LOW (ref 3.87–5.11)
RBC: 3.54 MIL/uL — ABNORMAL LOW (ref 3.87–5.11)
RBC: 4.18 MIL/uL (ref 3.87–5.11)
WBC: 11.6 10*3/uL — ABNORMAL HIGH (ref 4.0–10.5)
WBC: 12.6 10*3/uL — ABNORMAL HIGH (ref 4.0–10.5)
WBC: 8.5 10*3/uL (ref 4.0–10.5)
WBC: 9.8 10*3/uL (ref 4.0–10.5)

## 2010-12-13 LAB — URINE CULTURE
Colony Count: NO GROWTH
Culture: NO GROWTH

## 2010-12-13 LAB — DIFFERENTIAL
Basophils Absolute: 0 10*3/uL (ref 0.0–0.1)
Eosinophils Relative: 0 % (ref 0–5)
Lymphocytes Relative: 14 % (ref 12–46)
Lymphs Abs: 1.6 10*3/uL (ref 0.7–4.0)
Neutro Abs: 9 10*3/uL — ABNORMAL HIGH (ref 1.7–7.7)
Neutrophils Relative %: 78 % — ABNORMAL HIGH (ref 43–77)

## 2010-12-13 LAB — URINE MICROSCOPIC-ADD ON

## 2010-12-13 LAB — POCT CARDIAC MARKERS
CKMB, poc: 3 ng/mL (ref 1.0–8.0)
Myoglobin, poc: 191 ng/mL (ref 12–200)
Myoglobin, poc: 252 ng/mL (ref 12–200)
Troponin i, poc: <0.05 ng/mL (ref 0.00–0.09)

## 2010-12-13 LAB — CK TOTAL AND CKMB (NOT AT ARMC): Relative Index: INVALID (ref 0.0–2.5)

## 2010-12-13 LAB — CARDIAC PANEL(CRET KIN+CKTOT+MB+TROPI)
CK, MB: 0.9 ng/mL (ref 0.3–4.0)
CK, MB: 0.9 ng/mL (ref 0.3–4.0)
Total CK: 57 U/L (ref 7–177)

## 2011-01-11 ENCOUNTER — Ambulatory Visit (HOSPITAL_COMMUNITY)
Admission: RE | Admit: 2011-01-11 | Discharge: 2011-01-11 | Disposition: A | Discharge status: Home / Self Care | Payer: PRIVATE HEALTH INSURANCE | Source: Ambulatory Visit | Attending: Gastroenterology | Admitting: Gastroenterology

## 2011-01-11 ENCOUNTER — Other Ambulatory Visit: Payer: Self-pay | Admitting: Gastroenterology

## 2011-01-11 DIAGNOSIS — D649 Anemia, unspecified: Secondary | ICD-10-CM | POA: Insufficient documentation

## 2011-01-11 DIAGNOSIS — E119 Type 2 diabetes mellitus without complications: Secondary | ICD-10-CM | POA: Insufficient documentation

## 2011-01-11 DIAGNOSIS — J4489 Other specified chronic obstructive pulmonary disease: Secondary | ICD-10-CM | POA: Insufficient documentation

## 2011-01-11 DIAGNOSIS — I739 Peripheral vascular disease, unspecified: Secondary | ICD-10-CM | POA: Insufficient documentation

## 2011-01-11 DIAGNOSIS — K861 Other chronic pancreatitis: Secondary | ICD-10-CM | POA: Insufficient documentation

## 2011-01-11 DIAGNOSIS — K5730 Diverticulosis of large intestine without perforation or abscess without bleeding: Secondary | ICD-10-CM | POA: Insufficient documentation

## 2011-01-11 DIAGNOSIS — I10 Essential (primary) hypertension: Secondary | ICD-10-CM | POA: Insufficient documentation

## 2011-01-11 DIAGNOSIS — K648 Other hemorrhoids: Secondary | ICD-10-CM | POA: Insufficient documentation

## 2011-01-11 DIAGNOSIS — K2980 Duodenitis without bleeding: Secondary | ICD-10-CM | POA: Insufficient documentation

## 2011-01-11 DIAGNOSIS — K7460 Unspecified cirrhosis of liver: Secondary | ICD-10-CM | POA: Insufficient documentation

## 2011-01-11 DIAGNOSIS — K219 Gastro-esophageal reflux disease without esophagitis: Secondary | ICD-10-CM | POA: Insufficient documentation

## 2011-01-11 DIAGNOSIS — J449 Chronic obstructive pulmonary disease, unspecified: Secondary | ICD-10-CM | POA: Insufficient documentation

## 2011-01-11 DIAGNOSIS — D126 Benign neoplasm of colon, unspecified: Secondary | ICD-10-CM | POA: Insufficient documentation

## 2011-01-11 DIAGNOSIS — M129 Arthropathy, unspecified: Secondary | ICD-10-CM | POA: Insufficient documentation

## 2011-01-11 NOTE — Consult Note (Signed)
[name], [name]               ACCOUNT NO.:  [id number]   MEDICAL RECORD NO.:  [id number]          PATIENT TYPE:  INP   LOCATION:  1523                         FACILITY:  [facility]   PHYSICIAN:  Shirley Friar, MDDATE OF BIRTH:  02-04-1946   DATE OF CONSULTATION:  DATE OF DISCHARGE:                                 CONSULTATION   REQUESTING PHYSICIAN:  Dr. Fleet Contras.   INDICATIONS:  Anemia.   HISTORY OF PRESENT ILLNESS:  [name] is a 65 year old black female who  was admitted on February 04, 2007 secondary to abdominal pain.  During her  evaluation here, she was newly diagnosed with type 2 diabetes.  She  developed gram-negative sepsis and a urinary tract infection.  She has  been treated for all that and was found to have urosepsis due to E.  coli.  In the hospital, she has had anemia with a hemoglobin that has  gone down to 7.5 on 2 different occasions; it is currently 8.9.  She is  Hemoccult-positive.  Denies any black stools or bright red blood per  rectum.  Not having any nausea or vomiting.  She does have a history of  cirrhosis.  She is a former alcohol abuser, but denies any alcohol in 7  years.   PAST MEDICAL HISTORY:  As above.   MEDICATIONS:  Currently see chart.   ALLERGIES:  PENICILLIN.   FAMILY HISTORY:  Noncontributory.   SOCIAL HISTORY:  Former drinker, none in 7 years.   REVIEW OF SYSTEMS:  Negative except as stated above.   PHYSICAL EXAM:  VITAL SIGNS:  Temperature 98.5, pulse 100, blood  pressure 128/77.  O2 SAT is 99% on room air.  GENERAL:  Alert, in no acute distress.  ABDOMEN:  Mild distention, soft, nontender, positive bowel sounds.   IMPRESSION:  [age]-year-old black female with known history of  cirrhosis of the liver, who has iron-deficiency anemia and is Hemoccult  positive.  Her ferritin was 1100, but that is an acute phase reactant,  he but her iron saturation was 11%.  No evidence of any current  gastrointestinal bleeding, but is  having chronic anemia.  She  needs to have a colonoscopy as well as upper endoscopy -- that can be  done as an outpatient -- to further evaluate her anemia.  We will have  the patient set up in the office to discuss these procedures further and  plan to do her colonoscopy and endoscopy in late July to early August.      Shirley Friar, MD  Electronically Signed     VCS/MEDQ  D:  02/21/2007  T:  02/22/2007  Job:  [id number]   cc:   Fleet Contras, M.D.  Fax: [phone]

## 2011-01-11 NOTE — Cardiovascular Report (Signed)
[name], [name]               ACCOUNT NO.:  [id number]   MEDICAL RECORD NO.:  [id number]          PATIENT TYPE:  INP   LOCATION:  2807                         FACILITY:  [facility]   PHYSICIAN:  Cristy Hilts. Jacinto Halim, MD       DATE OF BIRTH:  1946/06/05   DATE OF PROCEDURE:  03/10/2008  DATE OF DISCHARGE:                            CARDIAC CATHETERIZATION   REFERRING PHYSICIAN:  Fleet Contras, MD   PROCEDURE PERFORMED:  1. Abdominal aortogram.  2. Pelvic aortogram.  3. Right iliac arteriogram.  4. Left iliac arteriogram with bilateral groin access.  5. Crossover into the right iliac artery through the left groin and      also vise a versa crossover from the left iliac artery via right      femoral access.  6. Stenting of right common iliac artery.  7. Stenting of the left common and external iliac arteries.   INDICATIONS:  [name] is a 65 year old African American female  with history of diabetes, colonic polyps, and history of alcoholic  hepatitis in the past, chronic calcific pancreatitis, chronic renal  insufficiency, COPD, who has been complaining of lifestyle-limiting  claudication.  Her ABI on the right was 0.42 and on the left is 0.66.  The Dopplers have suggested bilateral common iliac artery stenosis and  occluded right SFA and high-grade left SFA stenosis.  Given her  significant symptoms of claudication, she was brought to the  catheterization lab for peripheral angiography and possible angioplasty  of the pelvic arteries.  Limited contrast use was planned and also CO2  angiography was planned.   Abdominal aortogram done.  Aortogram revealed presence of 2 renal  arteries, one on either sides.  They are widely patent.  The aortoiliac  bifurcation was widely patent.   Right iliac artery.  The right iliac artery showed a 70% stenosis with a  20-30-mm pressure gradient across the stenosis across the right common  iliac artery stenosis.  Right external iliac artery was  patent.   Right iliac artery with femoral follow-through.  Right iliac artery with  femoral follow-through revealed a short segment occlusion of the right  SFA.  Prior to the occlusion, there was a 70% mid SFA stenosis.  Below  the knee angiography was not performed and the distal SFA was not  visualized to limit the contrast.   Left iliac artery with left iliac arteriogram, left iliac arteriogram  revealed a high-grade stenosis about 80-90% stenosis of the left common  and external iliac artery with a dissection of the flap.  The left  internal iliac artery was occluded.   INTERVENTION DATA:  Successful PTA and stenting of the left external and  common iliac artery with implantation of a 9.0 x 80-mm Protege self-  expanding stent postdilated with a 7.0 x 60-mm FoxCross balloon at 5  atmospheres pressure for 40 seconds.  Stenosis was reduced from 80% to  0%.   Successful PTA and stenting of the right common iliac artery with  implantation of a 9.0 x 40-mm Protege stent.  This was then postdilated  with a  7.0 x 60-mm FoxCross balloon at 4 atmospheres pressure for 30  seconds.  The stenosis was reduced from 70% to 0%.  Excellent results  were noted.   RECOMMENDATIONS:  I expect significant improvement in her claudication  distance.  If ABIs do not improve and she has continued claudications,  then we have considered PTA of the superficial femoral arteries.   A total of 50 mL of contrast was utilized for diagnostic and  interventional procedure.  CO2 angiography was also performed.   DIAGNOSTIC PROCEDURE:  Under usual sterile precautions, using a 5-French  right femoral arterial access, a 5-French Omni flush catheter was  utilized to perform abdominal aortogram using CO2 injection.  Subtraction images were obtained.   The left common iliac artery was selectively cannulated and crossed over  from the right and left iliac arteriogram was performed.  Using an end-  hole via the right  femoral access, right iliac arteriogram was also  performed.   We decided to proceed with stenting of the left external and common  iliac artery, hence a left femoral arterial access was obtained and a 7-  [place] long Rumel bright tip sheath was advanced into the common iliac  artery.  We decided to proceed with direct stenting with a Protege stent  with a 9.0 x 80-mm self-expanding stent.  This stent was postdilated  with a 7.0 x 60-mm FoxCross balloon at 5 atmospheres pressure.  Excellent results were noted.  Then, we proceeded with obtaining  pressure gradient across the right common iliac artery stenosis.  There  was a 20-30 mm pressure gradient noted after giving nitroglycerin.  Hence we decided to stent this and using the left femoral access that  was already obtained.  I was able to cross using a crossover catheter  and was able to engage the right common iliac artery and injections were  performed from the left femoral artery into the right iliac artery.  Overall Wholey wire, a 9.0 x 40-mm Protege stent was advanced into the  common iliac artery and the stent was deployed and postdilated with a  7.0 x 60-mm FoxCross balloon at 4 atmospheres pressure.  Then,  angiography revealed excellent results.  Overall, the patient tolerated  the procedure well.  The sheaths were exchanged to short sheath and  sutured in place.  The patient received heparin.  ACT maintained greater  than 200.      Cristy Hilts. Jacinto Halim, MD  Electronically Signed     JRG/MEDQ  D:  03/10/2008  T:  03/11/2008  Job:  [id number]   cc:   Fleet Contras, M.D.

## 2011-01-11 NOTE — Discharge Summary (Signed)
[name], [name]               ACCOUNT NO.:  [id number]   MEDICAL RECORD NO.:  [id number]          PATIENT TYPE:  INP   LOCATION:  2040                         FACILITY:  [facility]   PHYSICIAN:  Cristy Hilts. Jacinto Halim, MD       DATE OF BIRTH:  1946/04/14   DATE OF ADMISSION:  03/10/2008  DATE OF DISCHARGE:  03/16/2008                               DISCHARGE SUMMARY   DISCHARGE DIAGNOSES:  1. Peripheral arterial disease with lifestyle-limiting claudications.  2. Successful stenting of right common iliac artery and left common      iliac artery/external iliac artery by Dr. Jacinto Halim.  3. Chronic renal insufficiency.  4. Chronic anemia.  5. Chronic calcific pancreatitis.  6. History of colonic polyps with history of polypectomy.  7. Questionable alcoholic hepatitis versus cirrhosis although there is      not any significant cirrhosis on CT of the abdomen.  8. Diabetes mellitus type 2 which is new and uncontrolled.  9. Tobacco history stopped 3 months prior to the admission..  10.Significant erosive esophagitis distally most likely her reason for      heme-positive stools and chronic anemia.      a.     The patient will need followup endoscopy in 6-8 weeks to       check for healing.  11.More acute anemia with transfusion of 2 units of packed cells.  12.Gastrointestinal bleed secondary to severe erosive esophagitis.  13.Hypotension improved with intravenous fluids.  14.Urinary tract infections.   PROCEDURES:  1. PV angiogram March 10, 2008, by Dr. Yates Decamp including percutaneous      transluminal angioplasty and stents to the right CIA and to the      left common iliac artery/external iliac artery.  2. Esophagogastroduodenoscopy by Dr. Matthias Hughs with results as      previously stated, March 14, 2008.   DISCHARGE CONDITION:  Improved.   DISCHARGE MEDICATIONS:  1. Aspirin 81 mg daily.  2. Lyrica 75 mg at bedtime.  3. Thiamine 100 mg daily.  4. Hemocyte 1 tablet daily.  5. Folate 1 mg daily.  6. Allopurinol 100 mg daily.  7. Pletal 100 mg twice a day.  8. Lactulose 30 mL twice a day.  9. Albuterol nebs twice a day as needed.  10.Glimepiride 1 mg with food daily.  11.Darvocet-N 100 every 12 hours as needed for pain.  12.Prilosec 20 mg one twice a day.  13.Cipro 250 mg one twice a day until gone.   DISCHARGE INSTRUCTIONS:  1. Low-sodium heart-healthy diabetic diet.  2. Increase activity slowly.  3. May shower bathe.  No lifting for 2 days.  No driving for 2 days.  4. Wash cath site with soap and water.  Call if any bleeding,      swelling, or drainage.  5. Follow up with Dr. Bosie Clos of Gastroenterology on March 27, 2008,      at 7:45.  6. Follow up with Dr. Jacinto Halim in 1-2 weeks, the office will call with      date and time  7. Have lab work done on Wednesday in Dr. Albertina Parr  office.  8. Please stop Lasix, stop potassium, and stop spironolactone.   HISTORY OF PRESENT ILLNESS:  A 65 year old female with long history of  claudication with multiple treatments including nonsteroidal therapy,  steroids, oxycodone and Lyrica without improvement.  She underwent lower  extremity arterial Dopplers which were markedly abnormal and was  referred to Dr. Jacinto Halim for PV angiogram workup.  The patient has  sedentary lifestyle because of her inability to walk even half a block  without having significant discomfort in both the calf and hips.   PAST MEDICAL HISTORY:  1. Hypertension.  2. Diabetes.  3. History of colonic polyps.  4. History of alcoholic hepatitis.  5. Chronic calcific pancreatitis.  6. Chronic renal insufficiency.  7. COPD.  8. Diabetes mellitus type 2.  9. Recently stopped tobacco.   ALLERGIES:  PENICILLIN.   FAMILY HISTORY, SOCIAL HISTORY, AND REVIEW OF SYSTEMS:  See H&P.   PHYSICAL EXAMINATION ON DISCHARGE:  Episodic right upper quadrant pain.  Lungs were clear.  Lower extremities without edema.  Cath site stable.  Please note the patient had EF greater than 55%  and had had a nuclear  stress test that was negative for ischemia in June 2009.   HOSPITAL COURSE:  [name] is a 65 year old patient of Dr. Jacinto Halim who  was brought in electively and underwent PTA and stents to lower  extremity arterial sites as stated.  Post procedure, she developed  hypotension with a systolic pressure in the 80s.  She was given IV fluid  boluses with improvement.  Also CBC was ordered.  Hemoglobin had dropped  and she was transfused 1 unit of packed cells.  She slowly started  feeling better did complain of right flank pain.  CT of abdomen and  pelvis were done.  No acute findings from the abdomen, chronic calcific  pancreatitis, and moderate hiatal hernia.  CT of pelvis, no evidence of  retroperitoneal hemorrhage, post procedural edema and air in the left  groin site.   The patient's stool was heme-positive.  She got 4 units of packed cells.  By March 13, 2008, she felt better overall.  Also, she was found to have  UTI and was placed on antibiotics.  Her diabetes was diet controlled and  then creatinine which had been 2.43 on March 10, 2008, had come down to  1.6.   She continued with IV fluids.  GI was called for consultation and was  felt EGD would be appropriate and we continued PPI b.i.d.  She was found  to have severe erosive esophagitis the lower half of her esophagus.  She  received the last 2 units of packed cells for a total of 4 and by March 16, 2008, felt much better.  Hemoglobin was up to 10.5 and she was felt  ready to discharge home that she would need followup with Dr. Bosie Clos.   LABORATORY VALUES:  Hemoglobin initially on March 11, 2008, was 8.9 and  hematocrit 23, WBC 13, and platelets 280.  On March 12, 2008, hemoglobin  was up to 10.7 with hematocrit of 32, platelets 274, and again WBC 13.  WBC peaked to 15.8, hemoglobin once transfused was 10.7 as stated,  slowly drift down and dropped to a low of 8 again with a hematocrit of  24, I again  transfused 2 units.   On admission, neutrophils 70, lymphs 16, mono 12, eos 1, and baso 1.   Chemistry, sodium 136, potassium 4.3, chloride 104, CO2 22, glucose  90,  BUN 57, and creatinine 2.43.  At the time of discharge, sodium 137,  potassium 4.2, chloride 115, CO2 15, glucose 81, BUN 13, and creatinine  1.5.   Total protein 5.7, albumin 2.5, AST 30, ALT 18, alkaline phos 65, and  total bili 0.5.   UA on March 11, 2008, had a large amount of leukocyte esterase, nitrite  was negative but there were WBCs 21-50 and by March 13, 2008, there were  too many to count.   Blood cultures revealed no growth for 5 days x2.  Urine culture greater  than 1000 colonies, multiple bacterial morphotypes present.   X-rays, CT as stated.  Chest x-ray; initially on admission for procedure  chest x-ray basilar pulmonary scarring.  No active process.  Follow up  on March 11, 2008, mild atelectasis of the left lung base.  There was an  old fracture of the right eighth rib, an old thoracic spine fracture.  PA and lateral was done on March 12, 2008, bilateral effusions with  progressive atelectasis and on March 14, 2008, there was a shortness of  breath, enlarging bilateral pleural effusions with worsening basilar  atelectasis and vascular congestion.   The patient's EKG had been done in our office previously with a negative  nuclear study.   Hospital course as stated.  The patient will follow up as an outpatient.  Blood pressure was controlled at the time of discharge as well as bleed  and that will need to be followed as an outpatient.      Darcella Gasman. [place], N.P.      Cristy Hilts. Jacinto Halim, MD  Electronically Signed   LRI/MEDQ  D:  04/27/2008  T:  04/28/2008  Job:  [id number]   cc:   Bernette Redbird, M.D.  Shirley Friar, MD  Fleet Contras, M.D.  Cristy Hilts. Jacinto Halim, MD

## 2011-01-11 NOTE — Discharge Summary (Signed)
NAMESHARMAN, [name]               ACCOUNT NO.:  [id number]   MEDICAL RECORD NO.:  [id number]          PATIENT TYPE:  INP   LOCATION:  5038                         FACILITY:  [facility]   PHYSICIAN:  Fleet Contras, M.D.    DATE OF BIRTH:  1946/07/29   DATE OF ADMISSION:  09/19/2008  DATE OF DISCHARGE:  09/23/2008                               DISCHARGE SUMMARY   HISTORY OF PRESENTING ILLNESS:  Please see the dictated H&P for full  details of presentation.  In summary, [name] is a 65 year old African  American lady with multiple medical problems including liver cirrhosis,  COPD, type 2 diabetes mellitus, arthritis of the hips and knees,  allergic rhinitis, and osteoporosis.  She came to the emergency room at  [facility] with 1-day history of feeling unwell with anterior  chest pain burning in nature, nonradiating associated with some cough,  which is nonproductive.  The pain was worse with movement and with  coughing.  She did not have any nausea or vomiting.  The pain was not  related to her diet or exertion.  She received sublingual nitro without  relief.  She had received relief with oxycodone by mouth.  She was  admitted to the hospital to rule out acute coronary syndrome as she has  multiple risk factors for heart disease.   HOSPITAL COURSE:  On admission, her vital signs were stable.  She was  not in any acute respiratory or painful distress at the time she was  seen.  Her initial set of blood work showed elevated D-dimer and a VQ  scan was performed.  This showed some perfusion defects that were  compatible with ventilatory defect that was read as a low to moderate  probability for PE.  Otherwise, history of COPD and chronic lung  changes.  He was thought that this was not likely due to PE and she was  started on prophylactic DVT treatment.  Serial CPK and troponin were  performed and they were all negative.  Her urinalysis was strongly  suggestive of urinary tract  infection and she was started on intravenous  ciprofloxacin.  This has now been changed to oral treatment.  She was  also on nebulized bronchodilators and gentle IV hydration because of  elevated BUN and creatinine.  She has now improved significantly with no  further chest pain or shortness of breath.  She is tolerating full oral  intake.  Her white count has returned back to normal and she has not had  any fevers.  Today, she is feeling better and wants to go home.  Her  vital signs today shows a blood pressure of 98/63, heart rate of 94,  respiratory rate of 18, temperature of 97.3, O2 sats on room air is 97%.  Her CBG this morning was 92.  Her chest is clear to auscultation.  Heart  sounds 1 and 2 are heard with no murmurs.  No S3 gallops.  Abdomen is  obese, soft, nontender.  No masses.  Bowel sounds are present.  Extremities shows no edema, calf tenderness, or swelling.  LABORATORY DATA:  White count is 8.5, hemoglobin 9.4, hematocrit 28.1,  platelet count of 342.  Sodium was 137, potassium is 5.2, chloride 111,  bicarbonate of 19, BUN is 24, creatinine is 1.69, and glucose is 100.  LFTs are within normal limits.  Urine and blood cultures so far have  been negative.  She is now considered stable for discharge home.   DISCHARGE DIAGNOSES:  1. Chest pain, acute coronary syndrome ruled out.  2. Urinary tract infection, improving on ciprofloxacin.  3. Renal insufficiency, acute on chronic improving with gentle      hydration.  4. Chronic obstructive pulmonary disease exacerbation, improving.  5. History of liver cirrhosis, well controlled.   DISCHARGE MEDICATIONS:  1. Lactulose 30 mL b.i.d.  2. Ciprofloxacin 250 mg p.o. b.i.d. for 7 days.  3. Flonase 2 sprays each nostril once a day.  4. Folic acid 1 mg daily.  5. Januvia 50 mg daily.  6. Potassium and Lasix had been put on hold.  7. Lidoderm patch 5% one patch q. 24 h.  8. Lyrica 75 mg at bedtime.  9. Prilosec 10 mg p.o.  b.i.d.  10.Vitamin B1 one p.o. daily.  11.Allopurinol 100 mg p.o. daily.  12.Ambien 10 mg p.o. at bedtime p.r.n.  13.Claritin 10 mg p.o. daily p.r.n.  14.Oxycodone 5 mg p.o. b.i.d. p.r.n.  15.Albuterol nebs 2.5 mg q.6 p.r.n.   She is to follow up with me in the office in 3 days for check of renal  function and potassium.   CONDITION ON DISCHARGE:  Stable.   DISPOSITION:  Home.   This plan of care has been discussed with her and her questions  answered.      Fleet Contras, M.D.  Electronically Signed     EA/MEDQ  D:  09/23/2008  T:  09/24/2008  Job:  [id number]

## 2011-01-11 NOTE — Consult Note (Signed)
NAMEZIERRA, LAROQUE               ACCOUNT NO.:  [id number]   MEDICAL RECORD NO.:  [id number]          PATIENT TYPE:  INP   LOCATION:  3308                         FACILITY:  [facility]   PHYSICIAN:  Currie Paris, M.D.DATE OF BIRTH:  1945-09-30   DATE OF CONSULTATION:  04/17/2007  DATE OF DISCHARGE:                                 CONSULTATION   GASTROENTEROLOGIST:  Dr. Bosie Clos.   PRIMARY CARE PHYSICIAN:  Dr. Concepcion Elk.   REASON FOR CONSULTATION:  Extraluminal air sigmoid mesocolon, post  colonoscopy.   HISTORY OF PRESENT ILLNESS:  Ms. Funderburk is a 60-year female patient with  history of alcoholic cirrhosis and diabetes who underwent an evaluation  by gastroenterology because of rectal bleeding and anemia.  She  underwent colonoscopy today and developed abdominal pain postprocedure.  CT scan demonstrated a moderate extraluminal air in the sigmoid  mesocolon consistent with perforation without any free intraperitoneal  air, abscess or free fluid.  The patient was admitted by  gastroenterology services and placed on n.p.o. status and started  empirically on Cipro and Flagyl IV.  Her white count was elevated at  12,600, neutrophil count 64%, hemoglobin 9.2.  LFTs normal.  Surgical  consultation has been requested.   REVIEW OF SYSTEMS:  As per the history of present illness.  The patient  is currently complaining of lower mid and left abdominal pain with  coughing.  Otherwise she is reporting no difficulties.   SOCIAL HISTORY:  Reports former alcohol use, apparently quit in 2004.  Smokes at least two packs of cigarettes per day, apparently lives alone.   FAMILY HISTORY:  Noncontributory.   PAST MEDICAL HISTORY:  1. COPD secondary to tobacco abuse.  2. Alcoholic cirrhosis with reported cessation since 2004.  3. Diabetes mellitus.  4. Chronic calcific pancreatitis.  5. Osteoarthritis of the knees   PAST SURGICAL HISTORY:  Left ankle surgery to remove a nonmalignant  mass.   ALLERGIES:  PENICILLIN which causes hives.   MEDICATIONS AT HOME:  Include folic acid, Lasix, potassium, Prilosec,  spironolactone, allopurinol, lactulose, Amaryl, Ultram and some sort of  multiple vitamin, I cannot read the name, something plus daily.   PHYSICAL EXAM:  GENERAL:  Pleasant female patient complaining of lower  abdominal pain especially with coughing.  VITAL SIGNS:  Temperature 98.6, BP 115/53, pulse 94 and regular,  respirations 20.  NEURO:  The patient is tremulous. complaining of being  cold.  She is weak.  She is moving all extremities x4 without focal  deficits.  HEENT:  Head is normocephalic.  Sclerae are mildly icteric.  She has  mild exophthalmos appearance to the eye area.  NECK is supple without appreciable adenopathy.  CHEST:  Bilateral lung sounds clear to auscultation.  She is on nasal  cannula O2.  Her respiratory efforts are nonlabored.  CARDIAC:  S1-S2 without rubs, murmurs, thrills or gallops.  Pulse is  regular.  No JVD, IV fluids at 100 an hour.  ABDOMEN is distended and tympanitic but soft.  The patient reports this  to be her baseline abdominal distention.  She is tender mainly in  the  left lower abdomen and over the mid suprapubic area.  Bowel sounds are  present.  She has guarding without rebounding.  EXTREMITIES:  Symmetrical in appearance without edema.  Pulses are palpable.   LAB:  Urinalysis is suspicious for possible UTI, cultures are pending.  White count is noted 12,600, neutrophils 64%, hemoglobin 9.2, platelets  were unable to be adequately assessed.  There were clumping.  Sodium  135, potassium 4.1, CO2 is 15, glucose 97, BUN 48, creatinine 1.79.  LFTs are normal.  Diagnostic CT of the abdomen and pelvis as noted.  Colonoscopy today demonstrated a 25 mm polyp which was removed.  This  was found in the distal sigmoid colon, pathology is pending.  The  patient has diverticulosis without diverticulitis.  No evidence of  bleeding.  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   IMPRESSION:  1. Extraluminal air  in the sigmoid mesocolon after colonoscopy.      Suspect tiny perforation, no evidence of abscess or free fluid.  2. Sigmoid colon polyp, pathology pending with recent anemia.  3. Alcoholic cirrhosis  4. Diabetes mellitus.  5. Tobacco abuse and COPD.  6. Chronic calcific pancreatitis.   PLAN:  1. At this time agree with conservative medical management of this      patient.  Her abdomen does not appear to be acute at this time,      will keep her n.p.o., IV fluids and the current antibiotics as      ordered.  2. Expect this area should probably feel a fullness only with bowel      rest. Should pain increases or she develops significant white count      or fevers may need to repeat a CT scan, if  significant      abnormalities such as abscess or free intraperitoneal air, either      OR  versus percutaneous drainage of abscess per interventional      radiology.  3. In addition to the problems related to perforation we also need to      follow up the pathology of the polyp. If a malignancy is found we      would need to have the patient cleared from a surgical standpoint      by internal medicine and pulmonary services and cardiology.      Allison L. Rennis Harding, N.P.      Currie Paris, M.D.  Electronically Signed    ALE/MEDQ  D:  04/17/2007  T:  04/18/2007  Job:  [id number]   cc:   Fleet Contras, M.D.  Shirley Friar, MD

## 2011-01-11 NOTE — Op Note (Signed)
[name], [name]               ACCOUNT NO.:  [id number]   MEDICAL RECORD NO.:  [id number]          PATIENT TYPE:  INP   LOCATION:  2040                         FACILITY:  [facility]   PHYSICIAN:  Bernette Redbird, M.D.   DATE OF BIRTH:  01/16/1946   DATE OF PROCEDURE:  03/14/2008  DATE OF DISCHARGE:                               OPERATIVE REPORT   PROCEDURE:  Upper endoscopy with biopsy.   INDICATIONS:  Heme-positive stool and history of chronic anemia in a 65-  year-old female who had colonoscopy about a year ago with polyps (had a  contained perforation associated with that) but has never had upper  tract evaluation.  She is on daily baby aspirin as an outpatient, is not  on PPI therapy, and had an episode of coffee-ground emesis yesterday  evening.   FINDINGS:  Severe distally erosive esophagitis.   PROCEDURE:  The nature, purpose, and risks of the procedure had been  reviewed with the patient who provided written consent and was brought  in a fasted state from her hospital room to the endoscopy unit where she  was sedated with fentanyl 50 mcg and Versed 4 mg IV without clinical  instability.  The Pentax video endoscope was passed under direct vision.  The larynx looked grossly normal.  The esophagus was readily entered  under direct vision.     The proximal esophagus was normal but beginning at about 25-30 cm,  circumferential inflammatory changes were noted characterized by  exudate, mucosal irregularity, friability, contact hemorrhage, and  general erosive changes.  I did not see any discrete ulcerations nor  evidence of Barrett's esophagus, varices infection, or neoplasia.  There  is a past history of cirrhosis in this patient, but there really was  not good evidence for that radiographically or by labs, and I did not  see any evidence of esophageal or gastric varices.   A small-to-medium-sized hiatal hernia, approximately 3 cm, was present.  No esophageal ring or  stricture was appreciated.   The stomach contained a small amount of bilious fluid, no blood or  coffee-ground material.  The gastric mucosa was normal and specifically  free of aspirin gastropathy or portal gastropathy.  Retroflexed viewing  of the cardia showed no gastric varices.  No erosions, ulcers, polyps,  or masses were seen.  The pylorus, duodenal bulb, and second duodenum  were unremarkable.   A few esophageal biopsies were obtained prior to removal the scope.  The  patient tolerated the procedure well and there were no apparent  complications.   IMPRESSION:  Severe distal erosive esophagitis consistent with reflux  esophagitis, very possibly accounting for the patient's heme-positive  stool and possibly for her chronic anemia as well.  No evidence of  esophageal varices, despite a previous questionable history of alcohol  induced cirrhosis.Marland Kitchen   PLAN:  1. Await pathology results.  2. High dose proton pump inhibitor therapy.  3. The patient will need followup endoscopy in approximately 6-8 weeks      to check for healing of the esophagus.  4. The patient may benefit from Reglan if the  above interventions are      not effective in bringing about resolution of the esophagitis.           ______________________________  Bernette Redbird, M.D.     RB/MEDQ  D:  03/14/2008  T:  03/14/2008  Job:  [id number]   cc:   Shirley Friar, MD  Cristy Hilts. Jacinto Halim, MD  Fleet Contras, M.D.

## 2011-01-11 NOTE — Discharge Summary (Signed)
NAMEAMARISA, [name]               ACCOUNT NO.:  [id number]   MEDICAL RECORD NO.:  [id number]          PATIENT TYPE:  INP   LOCATION:  1523                         [facility]ACILITY:  [facility]   PHYSICIAN:  Fleet Contras, M.D.    DATE OF BIRTH:  Aug 15, 1946   DATE OF ADMISSION:  02/03/2007  DATE OF DISCHARGE:  02/22/2007                               DISCHARGE SUMMARY   ADMITTING PHYSICIAN:  Fleet Contras, M.D.   HISTORY OF PRESENTING ILLNESS:  [name] is a 65 year old African  American lady, with past medical history significant for liver cirrhosis  secondary to chronic alcoholism.  She presented to the emergency room at  [facility] with a 1-day history of abdominal pain, nausea,  diarrhea and feeling faint.  She did not have any loss of consciousness  or syncope, but she did have some fevers, chills.  In the emergency  room, she had a fever of 101 and vital signs were stable.  Heart rate  was 110.  She was not icteric.  She was not cyanotic.  She was not pale.   PHYSICAL EXAMINATION:  CHEST:  Her exam showed scattered rhonchi in the  chest.  ABDOMEN:  Abdomen was soft, with moderate central lower  abdominal tenderness.  Normal bowel sounds.   INITIAL LABORATORY DATA:  Showed a normal lipase 10.  Urinalysis showed  large WBC, with many bacteria.  Initial glucose level was 570, potassium  was 4.3.  Sodium was 130.   She was, therefore, diagnosed as a case of new-onset type 2 diabetes  mellitus and urinary tract infection, admitted to the hospital for  management.   HOSPITAL COURSE:  On admission, the patient was started on intravenous  Rocephin and tobramycin, as well as insulin to control her sugars.  Initial white count was 22,000 and she also received IV fluids because  __________ BUN was 45 and creatinine was 2.38.  She showed improvement  initially, but she continued to have low grade fevers.  Ultrasound scan  of her kidneys, ureter, bladder were performed and these were  negative  for hydronephrosis, pancreatitis or gallstones.  Infectious disease  consult was requested.  The patient was seen by Dr. Orvan Falconer on  02/12/2007 and he recommended a repeat ultrasound scan, but to continue  her on ciprofloxacin and discontinue tobramycin.  Urine culture did grow  E.  Coli, as well as her blood cultures, which were sensitive to  ciprofloxacin.  She had a maximal white count of 34 thousand, despite on  adequate antibiotics.  She was then changed to Septra.  Ciprofloxacin  was discontinued.  Her fever, cough gradually subsided and her white  count also began to decrease.  The repeat ultrasound scan essentially  was again normal.  She had a drop in her hemoglobin to 7.5.  Her stool  Guaiac was positive and she received 1 unit of packed red blood cell  transfusion.  Anemia profile was positive for iron deficiency.  She was  started on iron therapy.  A gastroenterology consult was requested and  it was thought that she was not a candidate  for inpatient workup at this  time, but was to follow up in the office for outpatient colonoscopy.  BUN and creatinine improved to BUN of 9 and creatinine of 1.8.  Glucose  also improved on insulin and she was started on sulfonylurea, which she  tolerated prior to discharge.  On 02/22/2007, she was feeling much  better, without any complaints.  She was tolerating full diet.  Vital  signs were stable and she was considered stable for discharge home.  Laboratory data showed a hemoglobin of 9.3, potassium 4.0, BUN was 11,  creatinine was 1.97 and glucose was 40.   DISCHARGE DIAGNOSES:  1. Escherichia urosepsis.  2. Alcoholic liver cirrhosis.  3. Hypoalbuminemia with peripheral edema.  4. Iron deficiency anemia.  5. Renal insufficiency.  6. New-onset type 2 diabetes mellitus.   DISCHARGE MEDICATIONS:  1. Folic acid 1 mg daily.  2. Lasix 40 mg daily.  3. Potassium chloride 10 mEq daily.  4. Prilosec 20 mg b.i.d.  5.  Spironolactone 50 mg daily.  6. Hematinic Plus one p.o. daily.  7. Allopurinol 100 mg daily.  8. Lactulose 30 mg daily.  9. Amaryl 2 mg daily.  10.Ultram 50 mg q.6 p.r.n.   FOLLOW UP:  She was to follow up with me in the office in one week.  Also,  follow up with Drs. Modena Jansky, gastroenterology, July 24.  This plan  of care was discussed with her and her questions were answered.      Fleet Contras, M.D.  Electronically Signed     EA/MEDQ  D:  04/12/2007  T:  04/13/2007  Job:  [id number]

## 2011-01-11 NOTE — H&P (Signed)
[name], [name]               ACCOUNT NO.:  [id number]   MEDICAL RECORD NO.:  [id number]          PATIENT TYPE:  INP   LOCATION:  3308                         FACILITY:  [facility]   PHYSICIAN:  Shirley Friar, MDDATE OF BIRTH:  September 21, 1945   DATE OF ADMISSION:  04/17/2007  DATE OF DISCHARGE:                              HISTORY & PHYSICAL   ADMISSION DIAGNOSES:  1. Possible colonic perforation.  2. Colonoscopy today with polypectomy.  3. Cirrhosis.  4. Diabetes mellitus.  5. Anemia.  6. Rectal bleeding.   HISTORY OF PRESENT ILLNESS:  [name] was seen in consultation by me  and in July secondary to heme-positive stool and anemia at the request  of Dr. Concepcion Elk.  She was set up for a colonoscopy and possible upper  endoscopy to be done to further evaluate her rectal bleeding, anemia,  and heme-positive stool.  She underwent a colonoscopy today, April 17, 2007, and during the procedure, she had a 2.5 cm pedunculated polyp in  the sigmoid colon.  This polyp was removed in piecemeal fashion, and the  polypectomy site was without any active bleeding or immediate  complications seen.  Upon insertion after removal of the polyp to again  look at the polypectomy site and then upon withdrawal from that site, it  appeared that the colonoscope may have caused a perforation in a  diverticulum in the distal sigmoid colon.  There was an acute onset of  bleeding in this area. and it appeared to peritoneal tissue was  visualized.  The colonoscope was withdrawn, and the patient was sent to  recovery in fair condition.  In recovery she remained stable with normal  blood pressure and denied any abdominal tenderness.  She was transferred  to the [facility] for further evaluation and management.   PAST MEDICAL HISTORY:  1. Cirrhosis.  2. Diabetes.   MEDICINES:  Lactulose, Lasix, potassium, spironolactone, multivitamin,  Prilosec, allopurinol, Amaryl, Ultram.   ALLERGIES:   PENICILLIN.   SOCIAL HISTORY:  Former alcohol, none in 7 years, positive cigarettes.   FAMILY HISTORY:  Noncontributory.   REVIEW OF SYSTEMS:  Negative except as stated above.   PHYSICAL EXAMINATION:  VITAL SIGNS:  Temperature 97.4, pulse 89, blood  pressure 121/76, O2 saturation 99% on 2 liters.  GENERAL:  Lethargic (sedated for procedure), no acute distress.  ABDOMEN:  Distension, soft, nontender, positive bowel sounds, no  rebound, no guarding.  CARDIOVASCULAR:  Regular rate and rhythm.  CHEST:  Clear to auscultation anteriorly.   IMPRESSION:  This is a 65 year old black female with cirrhosis and  diabetes who underwent a colonoscopy due to rectal bleeding and anemia  where she was found to have a large pedunculated polyp that was removed  without difficulty.  However, during the procedure, it appeared that she  may have a colonic perforation and is being admitted for further  evaluation and management.  She will be placed on Cipro, Flagyl because  she is allergic to PENICILLIN for broad-spectrum antibiotic coverage.  Will get a stat abdominal and pelvic CT scan and may need to call the  surgeon if there is a true perforation.  Otherwise, she will be in the  step-down unit with bowel rest and supportive care.      Shirley Friar, MD  Electronically Signed     VCS/MEDQ  D:  04/17/2007  T:  04/17/2007  Job:  [id number]   cc:   Fleet Contras, M.D.  Currie Paris, M.D.

## 2011-01-11 NOTE — Consult Note (Signed)
[name], [name]               ACCOUNT NO.:  [id number]   MEDICAL RECORD NO.:  [id number]          PATIENT TYPE:  INP   LOCATION:  1419                         FACILITY:  [facility]   PHYSICIAN:  Bernette Redbird, M.D.   DATE OF BIRTH:  [birth year]   DATE OF CONSULTATION:  05/06/2008  DATE OF DISCHARGE:                                 CONSULTATION   REASON FOR CONSULTATION:  We were asked to see [name] today in  consultation by Dr. Fleet Contras for rectal bleeding.   HISTORY OF PRESENT ILLNESS:  This is a very pleasant 65 year old female  who is a regular patient of Dr. Oswaldo Done Schooler's.  She has a history  of recent peripheral artery stenting and severe erosive esophagitis.  She tells me that last night she had to use the bathroom approximately 4  times.  The first time, she had a normal bowel movement; then, she had 2-  3 episodes of large volume dark red blood per rectum without stool.  She  denies any abdominal pain, heartburn, any emesis.  The patient tells me  that she also fell once that she can remember; however, she thinks she  may have fallen twice.  She has some difficulty remembering the events  of last night.  She denies NSAIDs.  She just saw Dr. Bosie Clos on March 27, 2008, and is scheduled for a colonoscopy on May 13, 2008.  She  also tells me that her mother was recently ill with some bleeding from  her rectum, and she wonders if she did not catch something from her.   PAST MEDICAL HISTORY:  1. Alcoholic cirrhosis.  However, she has not had any alcohol for over      7 years.  2. History of colon polyps.  She is status post a medically managed      colon perforation after polypectomy.  3. She has type 2 diabetes that was diagnosed in June of 2008.  4. She has peripheral artery disease.  She is status post stenting in      July of 2009.  5. She has chronic renal insufficiency.  6. Chronic pancreatitis.  7. Chronic anemia.  She had an upper EGD for chronic  anemia with heme-      positive stool on March 14, 2008 by Dr. Molly Maduro Buccini who found      severe distal erosive esophagitis.   ALLERGIES:  PENICILLIN.   CURRENT MEDICATIONS:  1. Lactulose.  2. Omeprazole once a day.  3. Hydrocodone.  4. Promethazine.  5. Spironolactone.  6. Lasix.  7. Folic acid.  (In her notes from her last hospitalization in July, I note that she was  on an 81 mg aspirin and Pletal).   REVIEW OF SYSTEMS:  Negative for fever, palpitations and shortness of  breath.   SOCIAL HISTORY:  She quit smoking approximately 4 months ago.  She quit  drinking any alcohol over 7 years ago.   FAMILY HISTORY:  Negative for colon cancer.   PHYSICAL EXAMINATION:  GENERAL:  She is alert and oriented, in no  apparent distress.  VITAL SIGNS:  Temperature is 98.1, pulse 93, respirations are 18, blood  pressure is 119/74.  HEART:  Regular rate and rhythm with no murmurs, rubs or gallops  appreciated.  LUNGS:  Clear to auscultation anteriorly.  ABDOMEN:  Large, soft but distended, nontender with good bowel sounds.   LABORATORY DATA:  She had a hemoglobin of 8.7 this morning, hematocrit  26.1, white count 12.8, platelets 246,000.  This afternoon after she was  transfused 1 unit, her hemoglobin was 9.9, which is approximately her  baseline.  BMET is significant for a potassium of 5.6, BUN 44,  creatinine 3.08, glucose 213,000.  Her PTT is 20, PT is 13.8, INR is  1.0.  An x-ray of her lumbar spine showed no acute findings, just  chronic pancreatitis.   ASSESSMENT:  Dr. Molly Maduro Buccini has seen and examined the patient,  collected a history and reviewed her chart.  He states that this is a 6-  year-old female with multiple medical problems, likely experiencing a  diverticular bleed, possibly mesenteric ischemia.  Will move forward  with a colonoscopy on May 07, 2008, plus/minus upper endoscopy.   Thanks very much for this consultation.      Stephani Police, PA     ______________________________  Bernette Redbird, M.D.    MLY/MEDQ  D:  05/06/2008  T:  05/07/2008  Job:  [id number]   cc:   Shirley Friar, MD  Fax: [phone]   Fleet Contras, M.D.  Fax: [phone]

## 2011-01-11 NOTE — Consult Note (Signed)
[name], [name]               ACCOUNT NO.:  [id number]   MEDICAL RECORD NO.:  [id number]          PATIENT TYPE:  INP   LOCATION:  2040                         FACILITY:  [facility]   PHYSICIAN:  Bernette Redbird, M.D.   DATE OF BIRTH:  [birth year]   DATE OF CONSULTATION:  03/13/2008  DATE OF DISCHARGE:                                 CONSULTATION   REFERRING PHYSICIAN:  Vonna Kotyk R. Jacinto Halim, MD   We were asked to see [name] today in consultation for heme-positive  anemia by Dr. Yates Decamp.   HISTORY OF PRESENT ILLNESS:  This is a very pleasant 65 year old female  who has a history of alcoholic cirrhosis, who was admitted for  peripheral artery stenting which successfully took place on March 10, 2008.  While the patient has been in the hospital, they have noticed  that her hemoglobin has been around 8 or 9.  She has required 2 units of  packed red blood cells.  She tells me that she did have heartburn  overnight, but typically does not have any heartburn or abdominal pain.  She has noticed no change in her bowel habits until this hospitalization  when she has noticed some dark red stools.  She denies NSAIDs, however,  some office notes from [facility] note that she has  tried NSAIDs in the past for her leg pain.  She has no known history of  ulcers or gastritis.  She tells me her appetite has been good.  She has  not had any weight loss.  She is complaining of right lower flank/back  pain [place].  Her RN reports a  large melenic/black/wet stool at approximately 1:15 p.m. today.   PAST MEDICAL HISTORY:  1. Significant for type 2 diabetes diagnosed in June 2008.  2. 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  3. Peripheral artery disease.  4. Chronic renal insufficiency.  5. Chronic pancreatitis.  6. History of colon polyps.  She unfortunately experienced a colonic      perforation after a polypectomy which was managed medically back  in      August 2008.   ALLERGIES:  She has an allergy to PENICILLIN.   CURRENT MEDICATIONS:  Lasix, potassium, spironolactone, 81 mg aspirin,  Lyrica, thiamine, Hemovite, vitamin, folate, allopurinol, Pletal,  lactulose, albuterol, cilostazol, glimepiride, and Darvon-N 100.   REVIEW OF SYSTEMS:  Negative for shortness of breath, palpitations,  anorexia, and weight loss.  Again, she is complaining of right flank  pain, mostly with movement.  Her pain is not relieved with eating or  defecation.   SOCIAL HISTORY:  She used to smoke tobacco.  She has stopped 4 months  ago.  She used to abuse alcohol and has not consumed any alcohol in the  last 7-8 years.   PHYSICAL EXAMINATION:  She is awake, appropriate.  Her temperature is  97.9, pulse 108, respirations 18, blood pressure is 96/53.  Heart has  regular rate and rhythm.  Lungs demonstrate no wheezes or crackles.  Her  abdomen is soft and obese.  She does have an umbilical hernia.  Her  abdomen is nontender.  She has good bowel sounds.   LABORATORY DATA:  She has a hemoglobin of 8.8 with an MCV value of 81.3,  that is after the patient received 1 unit of packed red blood cells on  14th and 1 unit of packed red blood cells on 15th.  White count is 12.4,  platelets 252,000.  She has been anemic.  Her E-chart since at least  June 2008, her baseline hemoglobin since that time has been 9-10 with an  MCV value of approximately 82.  BMET taken yesterday shows a BUN of 34,  creatinine 1.6.  Guaiac is positive with dark brown solid stool.   On March 11, 2008, CT of her abdomen showed no acute findings,  specifically no evidence of retroperitoneal hemorrhage.   ASSESSMENT:  Dr. Molly Maduro Buccini has seen and examined the patient,  collected a history and reviewed her chart.  His impression is that she  has the following gastrointestinal issues.  1. She is heme-positive, on an 81 mg aspirin, may be on nonsteroidal      anti-inflammatory drugs as  well.  2. Anemia.  She has had it chronically for at least the past year.      Iron studies done in July 2008 were essentially normal.  3. She has a history of cirrhosis but CT does not show evidence of      cirrhosis.  There is no ascites.  Her platelets are normal.  She      has normal LFTs and albumin, normal CT in Dr. Albertina Parr office.      She has no physical stigmata of chronic liver disease including      palmar erythema, lower extremity edema, asterixis.  Therefore, we      doubt cirrhosis.   RECOMMENDATIONS:  1. EGD to check for source of heme positivity/anemia and a screen for      varices.  2. If EGD is negative, we will consider Hematology consult.  3. Since the patient had colonoscopy within the past year, we will not      elect to repeat that now.   Thanks very much for this consultation.      Stephani Police, PA    ______________________________  Bernette Redbird, M.D.    MLY/MEDQ  D:  03/13/2008  T:  03/14/2008  Job:  [id number]   cc:   Fleet Contras, M.D.  Shirley Friar, MD

## 2011-01-11 NOTE — H&P (Signed)
NAMEVIRIGINIA, [name]               ACCOUNT NO.:  [id number]   MEDICAL RECORD NO.:  [id number]          PATIENT TYPE:  INP   LOCATION:  3738                         FACILITY:  [facility]   PHYSICIAN:  Fleet Contras, M.D.    DATE OF BIRTH:  April 05, 1946   DATE OF ADMISSION:  09/19/2008  DATE OF DISCHARGE:                              HISTORY & PHYSICAL   PRESENTING COMPLAINT:  Chest pain.   HISTORY OF PRESENT ILLNESS:  [name] [name] is a 65 year old African American  lady with multiple medical problems including cirrhosis of the liver,  chronic obstructive pulmonary disease, type 2 diabetes mellitus,  degenerative joint disease of the hips, allergic rhinitis, and  osteoporosis.  She came to the emergency room at [facility]  with 1-day history of central anterior chest pain, burning sore in  nature, nonradiating, and associated with cough which is nonproductive.  The pain worsened with movement and with coughing.  She did not have any  nausea or vomiting.  She denies any relationship to exertion or diet  with this chest pain.  She did not get any relief with sublingual  nitroglycerin, but she did get relief with oxycodone.  The pain has  subsided since coming to the hospital.  At the emergency room, her vital  signs were stable except for tachycardia.  She did have some rhonchi in  her chest on exam, and there was tenderness to the anterior chest wall.  Laboratory data showed negative EKG for acute coronary syndrome and  negative first set of CPK and troponin.  A D-dimer was slightly elevated  and a V/Q scan was performed, this showing some perfusion defects that  are compatible with area of ventilatory defect and was read as a low-to-  moderate probability for pulmonary embolism.  In view of multiple  coronary risk factors, she was admitted to the hospital to rule out  acute coronary syndrome and for control of her pain.   PAST MEDICAL HISTORY:  1. Allergic rhinitis.  2.  Osteoporosis.  3. 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  4. Chronic obstructive pulmonary disease.  5. Liver cirrhosis.  6. Chronic pancreatitis.  7. Type 2 diabetes mellitus.  8. Gastroesophageal reflux disease.  9. Peripheral vascular disease.  10.Depression.   MEDICATION HISTORY:  1. Potassium chloride 10 mEq p.o. daily.  2. Spironolactone 50 mg p.o. daily.  3. Hematinic 1 tablet daily.  4. Omeprazole 20 mg daily.  5. Folic acid 1 mg daily.  6. Thiamine 100 mg daily.  7. Furosemide 40 mg every other day.  8. Glimepiride 1 mg daily.  9. Aspirin 81 mg daily.  10.Cilostazol 100 mg 2 p.o. daily.  11.Lyrica 75 mg daily.  12.Darvon 1 p.o. b.i.d. p.r.n. for pain.  13.Lactulose 30 mL b.i.d.  14.Ambien CR 12.5 mg at bedtime p.r.n. for sleep.   ALLERGIES:  She has allergies to PENICILLIN.   SURGICAL HISTORY:  1. She had a colonoscopy and upper GI endoscopy in September 2009 for      gastrointestinal bleeding.  2. Left ankle surgery.  3. Aortofemoral bypass and stenting.  FAMILY AND SOCIAL HISTORY:  She lives by herself and has home health  aide to assist with activities of daily living.  She has 3 siblings, one  with asthma.  She denies any use of alcohol in the last 3 years.  She  smokes about 1-2 cigarettes per day occasionally.  She denies any use of  illicit drugs.   REVIEW OF SYSTEMS:  CNS:  She has no headaches, dizziness, blurring of  vision, weakness of extremities, or slurring of speech.  CVS:  As above.  RESPIRATORY:  As above.  GI:  No nausea, vomiting, abdominal pain, or  diarrhea.  GU:  No dysuria, frequency, or hematuria.  MUSCULOSKELETAL:  She has had low back pain on and off as well as pain in her knees for  which she takes Darvocet.   PHYSICAL EXAMINATION:  GENERAL:  She is lying in the hospital bed not in  acute respiratory or painful distress.  She is not pale.  She is not  icteric.  She is not cyanosed.  She is mildly dehydrated with dry  oral  and tongue mucosa.  VITAL SIGNS:  Blood pressure of 108/72, heart rate is 100, respiratory  rate is 18, temperature is 99.3, and O2 saturations on 2 liters is 97%.  NECK:  Supple with no elevated JVD.  No cervical lymphadenopathy.  CHEST:  Good air entry bilaterally with few expiratory rhonchi,.  No  rales.  HEART:  Sounds 1 and 2 are heard, tachycardic with no murmurs, no S3  gallops.  CHEST WALL:  Tender to palpation with no obvious swelling or deformity.  ABDOMEN:  Obese, soft, and nontender.  No masses.  Bowel sounds are  present.  EXTREMITIES:  No edema, calf tenderness, or swelling.  CNS:  She is alert and oriented x3 with no focal neurologic deficits.   LABORATORY DATA:  Chest x-ray reported as showing scarring and mild  atelectasis at the lung bases.  A nuclear medicine perfusion ventilation  scan shows patchy nonsegmental perfusion defects in the lung bases  corresponding to ventilation defects and chest x-ray abnormalities  compatible with a triple match.  No other significant pulmonary  perfusion for ventilation abnormalities are identified.  This is  considered as intermediate probability on the lower end of the range.  D-  dimer was 1.93.  CPK, MB, and troponin x2 are negative.  LFTs are within  normal limits except for an albumin of 3.7.  Sodium is 134, potassium  4.8, chloride 104, bicarbonate of 21, BUN is 32, creatinine 1.8, and  glucose is 108.  White count is 11.6, hemoglobin 10.9, hematocrit 33.9,  and platelet count of 369.   ASSESSMENT:  [name] [name] is a 65 year old African American lady with  multiple medical problems listed above presenting to the emergency room  with chest pain associated with cough and pleuritic symptoms.  She is  being admitted to rule out acute coronary syndrome and most likely has  acute exacerbation of chronic obstructive pulmonary disease.  Her V/Q  scan is more of low probability and CT scan to rule out PE could not be  done due  to elevated creatinine.  She will therefore be placed on DVT  prophylaxis with Lovenox and if her symptoms persist, we will get a CT  scan of the chest with angiogram once her creatinine is normalized.   ADMISSION DIAGNOSES:  1. Chest pain, atypical.  2. Chronic obstructive pulmonary disease exacerbation.  3. Renal insufficiency due to diuresis.  4. Chronic anemia.  5. Liver cirrhosis.  6. Type 2 diabetes mellitus.   PLAN OF CARE:  She will be admitted to a telemetry bed.  Serial CPK and  troponin times one more will be done.  She will be on 2 grams sodium  carb modified medium diet.  Vital signs q.4 h.  Gentle hydration with IV  half-normal saline at 5 mL an hour.  CBGs a.c. and at bedtime.  Cover  with sliding scale NovoLog insulin per moderate scale.  Continue her  home medications as above.  Lasix, we have put on hold until well  hydrated.  Sublingual nitro p.r.n. for chest pain, IV morphine 1-2 mg  q.4 h p.r.n. for severe pain, IV Zofran 4 mg q.4 h, Lidoderm patch to  chest wall, Flexeril 5 mg t.i.d., Avelox 400 mg p.o. daily, and  nebulized Xopenex 0.63 mg q.4 h p.r.n.  This plan of care has been  discussed with her and her questions are answered.      Fleet Contras, M.D.  Electronically Signed     EA/MEDQ  D:  09/20/2008  T:  09/20/2008  Job:  [id number]

## 2011-01-11 NOTE — Discharge Summary (Signed)
[name], [name]               ACCOUNT NO.:  [id number]   MEDICAL RECORD NO.:  [id number]          PATIENT TYPE:  INP   LOCATION:  6705                         FACILITY:  [facility]   PHYSICIAN:  Fleet Contras, M.D.    DATE OF BIRTH:  April 13, 1946   DATE OF ADMISSION:  03/06/2007  DATE OF DISCHARGE:  03/09/2007                               DISCHARGE SUMMARY   HISTORY OF PRESENTING ILLNESS:  [name] is a 65 year old African-  American lady with a past medical history significant for alcohol  related liver disease who presents to the emergency room at [facility] with complaints of abdominal pain, nausea, vomiting, an episode  of syncope.  In the emergency room, he was shocky,  hypotensive, but  improved with volume challenge.  She denied any use of alcohol or  nonsteroidal anti-inflammatory drugs.  Her initial laboratory data  showed a white count of 50,000, hemoglobin of 9.4, a platelet count of  530,000.  Actually on the aforementioned no acute pathology, was  therefore admitted to a medical bed, started on IV fluids and liquid  diet.   HOSPITAL COURSE:  The patient was started on IV fluids, was started on  clear liquids, this was slowly advanced as tolerated.  She was thought  to have had a vasovagal syncope.  Her stool guaiac was positive.  The  patient's abdominal pain, nausea, and vomiting subsided.  BUN and  creatinine also slowly improved.  On March 09, 2007, she was feeling much  better, was tolerating a full diet, and was ready to be discharged home.   DISCHARGE DIAGNOSES:  1. Acute gastritis.  2. History of alcohol abuse.  3. History of liver cirrhosis.  4. Type 2 diabetes mellitus.  5. Acute on-chronic renal insufficiency.   CONDITION ON DISCHARGE:  Stable.   DISPOSITION:  Home.   DISCHARGE MEDICATIONS:  1. Folic acid 1 tablet daily.  2. Lasix 40 mg daily.  3. Potassium chloride 2 mg daily.  4. Prilosec 20 mg b.i.d.  5. Spironolactone 20 mg daily.  6.  Hematinic Plus 1 a day.  7. Allopurinol 100 mg daily.  8. Lactulose 30 mL daily.  9. Amaryl 2 mg daily.  10.Ultram 30 mg every six p.r.n.      Fleet Contras, M.D.  Electronically Signed     EA/MEDQ  D:  04/10/2007  T:  04/11/2007  Job:  [id number]

## 2011-01-11 NOTE — H&P (Signed)
[name], [name]               ACCOUNT NO.:  [id number]   MEDICAL RECORD NO.:  [id number]          PATIENT TYPE:  INP   LOCATION:  1419                         FACILITY:  [facility]   PHYSICIAN:  Fleet Contras, M.D.    DATE OF BIRTH:  09-Oct-1945   DATE OF ADMISSION:  05/06/2008  DATE OF DISCHARGE:                              HISTORY & PHYSICAL   PRESENTING COMPLAINTS:  Rectal bleeding.   HISTORY OF PRESENT ILLNESS:  [name] is a 65 year old African American  lady with multiple medical problems including liver cirrhosis,  degenerative arthritis of the hips, coronary pulmonary disease, type 2  diabetes mellitus, chronic pancreatitis, allergic rhinitis and  osteoporosis.  She came to the emergency rooms [facility]  after she apparently fell at home.  She stated that she went to the  bathroom twice and had episodes of bright red blood in her commode.  She  did not have any abdominal pain.  She had no nausea or vomiting but she  felt weak and fell onto her left hip.  She was going to go to bed and  rest but her cousin advised her to call EMS which she did and she was  then brought to the emergency room.  She denies using any alcohol or  nonsteroidal anti-inflammatory drugs.  She had fevers.  She had no chest  pain, shortness of breath, orthopnea or PND.  She had no palpitations.  She had no headaches, blurring of vision but she did feel weak and  dizzy.  She did not recall passing out.  At the emergency room her vital  signs were stable. She is currently receiving blood transfusion.  Admitted to the hospital for close monitoring and further therapeutic  admission.   MEDICAL HISTORY:  1. Allergic rhinitis.  2. Osteoporosis.  3. Degenerative joint disease of the knees.  4. Chronic obstructive pulmonary disease.  5. Liver cirrhosis.  6. Chronic pancreatitis.  7. Type 2 diabetes mellitus.  8. Gastroesophageal reflux disease.  9. Peripheral vascular disease 10.  10.Depression.   MEDICATION HISTORY:  1. She is on potassium chloride 10 mEq daily.  2. Spironolactone 50 mg daily.  3. Lactulose 30 mL twice a day.  4. Thiamine 100 mg daily.  5. Miacalcin 1 spray alternate nostril daily.  6. Albuterol nebulized 2.5 mg one unit dose every 6 hours p.r.n.  7. Allegra 180 mg daily.  8. Nasacort AQ 2 sprays each nostril daily.  9. Folic acid 1 mg daily.  10.Ambien CR 12.5 mg once a day p.r.n.  11.Prilosec 20 mg b.i.d.  12.Albuterol 1 mg daily.  13.Hemocyte Plus 1 p.o. b.i.d.  14.Lyrica 75 mg nightly.  15.Darvon 65 mg 1 p.o. every 6 p.r.n.  16.Furosemide 40 mg 1 p.o. daily.   She is allergic to PENICILLIN which causes hives.   SURGICAL HISTORY:  Her last colonoscopy was about a year ago and she  suffered a colon perforation which did not need surgical intervention.  1. Left ankle surgery.  2. Aortofemoral stenting.   FAMILY AND SOCIAL HISTORY:  She currently lives by herself and has  an  aide to assist with her activities of daily living.  She has 3 siblings;  1 with asthma and obesity.  She denies any use of alcohol in the past 3  years.  She still smokes occasionally 1-2 cigarettes per day.  She  denies any use of illicit drugs.   REVIEW OF SYSTEMS:  Essentially as above.   PHYSICAL EXAMINATION:  She is lying in the hospital bed not in acute  respiratory or painful distress.  She is mildly pale.  She is not  icteric.  She is not cyanosed.  She is well-hydrated.  Her initial vital  signs shows a blood pressure 109/72.  Heart rate of 94 regular.  Respiratory rate of 20.  Temperature is 97.7.  O2 sat on room air is  98%.  NECK:  Her neck is supple with no elevated JVD.  No cervical  lymphadenopathy.  No carotid bruit.  CHEST:  Shows good air entry bilaterally with no rales, rhonchi or  wheezes.  HEART:  Sounds were not heard with no murmurs, no S3, gallop.  ABDOMEN:  Obese, soft, nontender.  Bowel sounds are present.  EXTREMITIES:  Shows 1+  pitting edema of the legs.  There is no calf  tenderness or swelling.  Peripheral pulses at the dorsalis pedis and  posterior tibial are absent.  CNS:  She is alert and oriented x3 with no focal neurological deficits.   LABORATORY DATA:  White count is 12.8, hemoglobin 8.7, hematocrit 26.1,  platelet count of 246.  Sodium is 139, potassium 5.6, chloride 109,  bicarbonate of 20, BUN is 44, creatinine 3.08, glucose is 213, AST is  27, ALT 19, alkaline phosphatase 102, total bilirubin 0.6, albumin is  3.0, calcium is 9.0.  X-ray of the lumbar spine shows no evidence of  acute spine fracture, spondylosis or spondylolisthesis.  There is an old  inferior endplate compression of L2 vertebral body.  There is mild  degenerative disk disease at the L3-4 and L4-5 as well as bilateral  facet degenerative joint disease at L4-5 and L5-S1.  There is  generalized osteopenia.   ASSESSMENT:  [name] is a 65 year old African American lady  with multiple medical problems presenting to the emergency room with  bright red rectal bleeding.  She has been admitted to the hospital for  further management.   ADMISSION DIAGNOSES:  1. Rectal bleeding of unknown etiology.  2. Anemia of chronic disease.  3. Chronic kidney disease with acute renal failure.  4. Type 2 diabetes mellitus poorly controlled.  5. History of liver cirrhosis.   PLAN OF CARE:  She will be admitted to a medical bed.  She is currently  receiving 1 unit of packed red blood cell transfusion and her hemoglobin  and hematocrit will be monitored every q.6 hours for the next 48 hours.  She is to receive IV fluids and D5 half-normal saline at 150 mL an hour.  Strict Is and Os will be monitored closely.  Gastroenterology consult will be requested.  CBG will be checked every 6  hours and cover with sliding scale NovoLog insulin.  She is going to be  kept n.p.o. until GI evaluation.  This plan of care has been discussed  with her and her  questions answered.      Fleet Contras, M.D.  Electronically Signed     EA/MEDQ  D:  05/06/2008  T:  05/07/2008  Job:  [id number]

## 2011-01-11 NOTE — Discharge Summary (Signed)
[name], [name]               ACCOUNT NO.:  [id number]   MEDICAL RECORD NO.:  [id number]          PATIENT TYPE:  INP   LOCATION:  6704                         FACILITY:  [facility]   PHYSICIAN:  Stephani Police, PA    DATE OF BIRTH:  05-09-1946   DATE OF ADMISSION:  04/17/2007  DATE OF DISCHARGE:  04/27/2007                               DISCHARGE SUMMARY   [name] was admitted on August 19 with a possible colonic perforation  following a colonoscopy for heme-positive stool anemia.  She was  discharged today, August 29, with the following discharge diagnoses.   DISCHARGE DIAGNOSES:  1. Small sigmoid perforation  2. Small sigmoid polyp.  Pathology:  Adenomatous.  Follow up in 2      years.  3. Alcoholic cirrhosis.  4. Diabetes mellitus type 2.  5. Anemia.  6. Rectal bleeding.  7. History of tobacco abuse.   SERVICE:  Gastroenterology.   ATTENDING:  Dr. Charlott Rakes   CONSULTS:  1. [facility].  2. Nutrition.   PROCEDURES:  None, but the patient had multiple CT scans during her  hospitalization.  [name] had a colonoscopy on August 19 to evaluate  her rectal bleeding and anemia.  She was found to have a 2.5-cm  pedunculated polyp in her sigmoid colon.  This polyp was removed in  piecemeal fashion, and the polypectomy site was without any active  bleeding or immediate complications seen.  Upon insertion after removal  of the polyp to again look at the polypectomy site and then upon  withdrawal from that site, it appeared that the colonoscope may have  caused perforation in a diverticulum in the distal sigmoid colon.  There  was an acute onset of bleeding in the area.  The patient was transferred  to Recovery in fair condition.  She remained stable with normal blood  pressure and denied any abdominal tenderness.  She was transferred to  [facility] for evaluation and management.  Upon admission, she  was made n.p.o. and given TNA for nutrition.   On August 19 she was seen  by Dr. Cyndia Bent of [facility].  His statement was  that there was extraluminal air in the sigmoid mesocolon after  colonoscopy, suspected tiny perforation.  No evidence of abscess or free  fluid.  His plan at this time:  Agree with conservative medical  management of the patient.  Her abdomen does not appear to be acute at  this time.  We will keep her n.p.o. on IV fluids and current  antibiotics, which were Cipro and Flagyl.   The patient was followed with serial CT scans of her abdomen.  The first  one was done on admission, August 19.  Results:  Moderate amount of  extraluminal air within the sigmoid mesentery, consistent with sigmoid  perforation.  The second CT scan was done on August 22.  It showed  development of a small abscess adjacent to the rectosigmoid junction  measuring 3.7 x 2.6 cm.  It also said persistent but  improved  extraluminal gas adjacent to the  rectum and sigmoid colon since 3 days  ago.   The final CT scan was done on April 25, 1939 with regards to her colon.  It says:  1. Small abscess adjacent to the sigmoid colon with persistent      extraluminal air.  2. Decrease in pelvic free fluid with peritoneal enhancement.  The      collection of fluid had decreased to 2.75 x 2.9 cm.   HOSPITAL COURSE:  During the course of her hospital stay, [name]  quickly improved.  She complained of very little abdominal pain.  She  did develop some ascites and was treated with Lasix as well as  spirolactone.  Her hemoglobin decreased from 9.2 on admission to a low  of 7.6 on approximately August 26.  The patient had been offered blood  transfusions multiple times and refused them.  Finally on August 26, she  accepted a blood transfusion and received 2 units of packed red blood  cells.  After receiving her blood transfusion, the patient reported that  she felt more steady on her feet.   The patient was followed by  Nutrition, who monitored her TNA,  also  provided education about a diabetic diet.  Further, she was seen by  Physical Therapy, who helped her ambulate.  They felt that acute  physical therapy in the hospital was not warranted but advised that the  patient receive physical therapy at home upon discharge.  On the morning  of August 29, the patient stated she was ready to go home.  She had no  complaints, no abdominal pain, no black stools.  She was able to  ambulate well.  She was alert, oriented, in no apparent distress.  Her  eyes showed no signs of icterus.  Her heart had a regular rate and  rhythm.  Her lungs did have some rumbles posteriorly that cleared with  cough.  Her abdomen was soft, nontender, and still had some distention,  likely ascites.  Hemoglobin was 10.4, hematocrit 30.9, white count 10.9,  platelet 330,000.  Sodium 134, potassium 5.5, chloride 104, bicarb 23,  BUN 34, creatinine 1.72, glucose 121.  The patient was deemed okay to DC  per surgery.  She is going home with physical therapy as well as skilled  nursing care to check both her CMP and her CBC at least twice.  These  blood results will be sent to both Dr. Bosie Clos as well as Dr. Concepcion Elk.  The patient is being discharged to home in good condition.   DISCHARGE MEDICATIONS:  1. Folic acid 1 mg daily  2. Lasix 40 mg daily  3. Potassium chloride which is on hold for 1 week  4. Prilosec 20 mg twice daily  5. Spirolactone 50 mg once daily  6. Allopurinol 100 mg once daily  7. Lactulose 30 mg once daily  8. Amaryl 2 mg once daily  9. Ultram 50 mg p.r.n.  10.The patient was also given prescriptions for Cipro 400 mg b.i.d.      times 7 days and Flagyl 500 mg t.i.d. times 7 days.   The patient is scheduled to follow up with Dr. Concepcion Elk on September 3  and Dr. Charlott Rakes on September 4.      Stephani Police, [place]     MLY/MEDQ  D:  04/27/2007  T:  04/28/2007  Job:  [id number]   cc:   Fleet Contras, M.D.   Shirley Friar, MD  Currie Paris, M.D.

## 2011-01-11 NOTE — Op Note (Signed)
[name], [name]               ACCOUNT NO.:  [id number]   MEDICAL RECORD NO.:  [id number]          PATIENT TYPE:  INP   LOCATION:  1419                         FACILITY:  [facility]   PHYSICIAN:  Bernette Redbird, M.D.   DATE OF BIRTH:  March 21, 1946   DATE OF PROCEDURE:  05/07/2008  DATE OF DISCHARGE:                               OPERATIVE REPORT   PROCEDURE:  Colonoscopy with polypectomy and directed submucosal  injection.   INDICATION:  A 65 year old female approximately 1 year status post  resection of a large sigmoid polyp by Dr. Bosie Clos, complicated by a  contained perforation.  She now presents to the hospital with multiple  episodes of hematochezia suggestive of a diverticular bleed and she is  known from her prior exam to have pancolonic diverticulosis.   FINDINGS:  Moderate-sized flat polyp in the proximal ascending colon,  cold snared.  No active bleeding.  Pancolonic diverticulosis.   PROCEDURE IN DETAIL:  The nature, purpose and risks of the procedure  were familiar to the patient from prior exam and she provided written  consent.  Sedation for this procedure and the upper endoscopy which  preceded it a total of fentanyl 100 mcg and Versed 10 mg IV without  clinical instability.  The Pentax pediatric video colonoscope was  inserted and advanced around the colon with considerable difficulty due  to tortuosity and looping.  Advancement was facilitated by having the  patient in the supine position and using some external abdominal  compression.  The cecum was reached as evidenced by visualization of the  appendiceal orifice, transillumination of the right suprapubic area, the  absence of further lumen and visualization of the ileocecal valve.  The  terminal ileum was unable to be entered due to difficulty controlling  the scope in the cecal region.   The quality of the prep was excellent.  However, it is noted that there  were little globs of inspissated blood tinged stool,  presumably extruded  from diverticular orifices, scattered throughout the colon.  This would  suggest that the patient probably did not have a distal source of  bleeding, although it is hard to be sure because blood can reflux around  the colon.  No fresh blood was observed anywhere in the colon.   A short distance above the ileocecal valve, probably about three  haustrations above the ileocecal valve, on the left wall of the colon  during insertion, was a flat adenoma measuring probably about 3 x 3 cm  in diameter.  It was very pale in appearance.  I elevated it by  injecting submucosal epinephrine, a total of 5 mL and then used the cold  snare to snip off pieces piecemeal.  By doing so, it appeared that most  if not all of the tissue was removed.  I elected to use the cold snare  for several reasons, including the fact that the patient had previously  had a polypectomy-related complication, because this was a thin-walled  section of the colon putting her at greater risk for perforation if  cautery was used and because she has been  having recently a lower GI  bleed and I did not want to risk having a delayed postpolypectomy  hemorrhage which could be confused with recurrent diverticular bleeding.   After snaring off and retrieving various polyp fragments, I injected  [place] ink submucosally in the middle of the lumen adjacent to the turn  off to get to this flat adenoma, which was tucked up inside a  haustration on the left hand side.  I then injected additional [place] ink  around the base of the polyp on either side and it more or less  infiltrated the entire polyp bed submucosal.   As previously noted, there were multiple diverticula throughout the  colon, including the ascending colon.   No evidence of cancer, colitis or vascular malformations was appreciated  and retroflexion in the rectum and reinspection of the rectum was  unremarkable.   There were probably a couple of tiny or  small polyps in the proximal  colon but they were not addressed during this rather lengthy, arduous  procedure.   The patient tolerated this procedure well and there were no apparent  complications.   IMPRESSION:  1. Flat polyp in the proximal ascending colon, cold snared as      described above with successful retrieval of several fragments of      tissue.  2. Pancolonic diverticulosis.  3. No active bleeding at the time of this exam.  No specific bleeding      site identified, but based on the colonoscopic appearance, I      suspect this was a diverticular bleed, which is now quiescent.   PLAN:  I have discussed the findings with Dr. Concepcion Elk.  We can advance  the patient's diet and observe her in the hospital another day or so,  after which consideration for discharge would be appropriate.           ______________________________  Bernette Redbird, M.D.     RB/MEDQ  D:  05/07/2008  T:  05/08/2008  Job:  [id number]   cc:   Fleet Contras, M.D.  Fax: [phone]   Shirley Friar, MD  Fax: [phone]

## 2011-01-11 NOTE — Op Note (Signed)
[name], [name]               ACCOUNT NO.:  [id number]   MEDICAL RECORD NO.:  [id number]          PATIENT TYPE:  INP   LOCATION:  1419                         FACILITY:  [facility]   PHYSICIAN:  Bernette Redbird, M.D.   DATE OF BIRTH:  November 30, 1945   DATE OF PROCEDURE:  05/07/2008  DATE OF DISCHARGE:                               OPERATIVE REPORT   PROCEDURE:  Upper endoscopy.   INDICATION:  Follow-up of previous esophagitis noted endoscopically by  Dr. Bosie Clos about a year ago.  In addition, she is currently having  what is felt to be lower GI bleeding, most likely secondary to  diverticulosis.  There is a history of alcohol related liver disease but  she is now abstinent from alcohol.   FINDINGS:  Resolution of esophagitis.  Schatzki's ring present.  No  varices.   PROCEDURE:  The patient was familiar with the procedure and provided  written consent.  She was brought from her hospital room to the [facility] and sedated with fentanyl 75 mcg and Versed 7 mg IV.   The Pentax video adult endoscope was passed under direct vision.  The  larynx was pertinent for what appeared to be prominence of the false  vocal cords but otherwise was grossly normal.  The esophagus was entered  without significant difficulty and although there were some minimally  prominent veins distally, no esophageal varices were present, nor was  there evidence of Barrett's esophagus, reflux esophagitis, infection or  neoplasia.  Specifically, although she had significant esophagitis,  previously a year ago on Dr. Marge Duncans endoscopy, none was present at  this time.   The stomach contained no residual and was normal in appearance without  evidence of gastritis, erosions, ulcers, polyps or masses including a  retroflexed view of the cardia.  There was no overt portal gastropathy.   The pylorus, duodenal bulb and second duodenum were normal except for  punctate erythema of the duodenal bulb and  second duodenum which was  fairly intense.   The scope was removed from the patient.  No biopsies were obtained.  She  tolerated the procedure well and there were apparent complications.   IMPRESSION:  1. Schatzki's ring.  2. History of previous esophagitis, now resolved.  3. History of alcohol-related liver disease, without definite      manifestations of portal hypertension on current endoscopy.   PLAN:  The patient should continue Prilosec 20 mg daily indefinitely to  prevent recurrence of the esophagitis.  Proceed to colonoscopic  evaluation.           ______________________________  Bernette Redbird, M.D.     RB/MEDQ  D:  05/07/2008  T:  05/08/2008  Job:  [id number]   cc:   Fleet Contras, M.D.  Fax: [phone]   Shirley Friar, MD  Fax: [phone]

## 2011-01-14 NOTE — Consult Note (Signed)
[name], [name]                           ACCOUNT NO.:  [id number]   MEDICAL RECORD NO.:  [id number]                   PATIENT TYPE:  INP   LOCATION:  5705                                 FACILITY:  [facility]   PHYSICIAN:  Petra Kuba, M.D.                 DATE OF BIRTH:  [birth year]   DATE OF CONSULTATION:  10/31/2002  DATE OF DISCHARGE:                                   CONSULTATION   HISTORY OF PRESENT ILLNESS:  The patient is seen at the request of Dr.  Concepcion Elk for probable cirrhosis and ascites.  She has a long alcohol history,  although she says she quit a few years ago.  She has never had ascites  before, but has had increased abdominal girth, discomfort and pedal edema  and is admitted for further workup and plans.  From a GI standpoint, she has  had no previous GI problems.  She eats well.  Her stomach is tight from the  fluid, but no other GI complaints and has no complaints moving her bowels or  seeing any blood.   PAST MEDICAL HISTORY:  1. COPD.  2. 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  3. 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  4. Hysterectomy, questionably complete, per the patient.  5. 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  6. Arthritis.   MEDICATIONS:  1. Inhalers.  2. Lasix.  3. Aldactone.  4. Potassium.  5. Protonix.  6. Thiamine.  7. Folate.  8. She had been on some Celebrex, but not continued in the hospital.  They     are going to use some Lovenox.   SOCIAL HISTORY:  She smokes, but does not drink any longer.  She denies any  other over-the-counter medical medicines.   FAMILY HISTORY:  Negative for any liver disease or obvious GI problems.   ALLERGIES:  PENICILLIN.   REVIEW OF SYMPTOMS:  Pertinent for some skin complaints as well.   PHYSICAL EXAMINATION:  GENERAL:  In no acute distress, lying comfortably in  the bed.  VITAL SIGNS:  Stable and afebrile.  HEENT:  Sclerae nonicteric.  LUNGS:  Clear.  HEART:  Regular rate and  rhythm.  ABDOMEN:  Pertinent for being probable significant ascites.  I do not  appreciate any hepatosplenomegaly.  It is nontender, no guarding or rebound.  She has obvious pedal edema.   LABORATORY DATA AND X-RAY FINDINGS:  Labs are pertinent for a white count of  11 with a surprisingly normal platelet count, but a hemoglobin of 9.8 with  an MCV of 89.  Her PT is 17.  Chemistries pertinent for a normal BUN and  creatinine.  Albumin 1.4, bilirubin normal at 0.9 with minimally elevated  Alk phos 148 with an SGOT of 63, SGPT 21.   ASSESSMENT:  1. Probable ascites secondary to probably alcoholic liver disease.  2. Anemia, questionable etiology.  3. Chronic  obstructive pulmonary disease.   PLAN:  Agree with CT, await findings.  If she does have ascites, would get  an ultrasound-guided paracentesis by the radiologist and send it for cell  count, differential, culture and sensitivity as well as albumin, protein and  cytology.  She will probably need an increased dose of Aldactone and I will  go ahead and do that and move her up to 100.  Will need to follow  electrolytes.  If other causes of liver disease have not been ruled out in  the past, would get one time the usual hepatitis B and C serologies as well  as alpha I antitrypsin, ANA and anti-smooth muscle just to be sure we are  not missing a possibly treatable cause of her problem.  She probably needs  some Vitamin K unless you want to keep her PT slightly elevated to prevent  DVT or blood clots.  She does need anemia workup and I would start with  guaiacing her stools as well as iron studies with B12 and folate.  Be  careful with the Lovenox with her PT already increased.  Will follow with  you.                                               Petra Kuba, M.D.    MEM/MEDQ  D:  10/31/2002  T:  11/01/2002  Job:  [id number]

## 2011-01-14 NOTE — Discharge Summary (Signed)
[place]. [facility]  Patient:    [name], [name]                        MRN: [id number] Adm. Date:  [id number] Disc. Date: [id number] Attending:  Madaline Guthrie Dictator:   Blanch Media, M.D. CC:         [facility] - [phone]  [facility]   Discharge Summary  DATE OF BIRTH:  Dec 31, 1945  DISCHARGE MEDICATIONS:  Protonix 40 mg p.o. q.d.  FOLLOW-UP INSTRUCTIONS: 1. The patient is to call [facility] to schedule an appointment.  She    was provided with the telephone number.  She was explained why she    needed to follow up with GI. 2. The patient is to follow up with her doctor at [facility] in    approximately two weeks.  DISCHARGE DIAGNOSES: 1. Syncope:    a. CT of the head with contrast showed atrophy, but no focal abnormality.    b. Felt strongly to be related to alcohol withdrawal. 2. Alcohol abuse. 3. Hyperbilirubinemia and increased alkaline phosphatase:    a. Abdominal ultrasound showed a normal liver, normal gallbladder,       and normal common bile duct.  The pancreas was poorly visualized,       secondary to overlying bowel gas. 4. Hypokalemia and hypomagnesemia. 5. Normocytic anemia with heme-positive stools:    a. Hemoglobin 10.5, MCV 92.1, RDW 14.4.    b. Empirically started on proton pump inhibitor.    c. To follow up with gastroenterology. 6. Fever on admission:    a. Likely thought to be secondary to aspiration pneumonitis.    b. White blood cell count 7.8. 7. Pyuria, asymptomatic.  HISTORY OF PRESENT ILLNESS:  [name] is a 65 year old female with longstanding history of alcohol abuse.  She had a fever in March 2002, which was thought to be secondary to alcohol withdrawal.  The patient presented to the emergency department on the evening of Jan 26, 2001, with the complaint of ______ _______.  On evaluation, she was sedated after 1 mg of Ativan, so most of the history was from the patients mother.  The patients  mother states that the patient drinks "anything she can" every day, and moved out of her mothers house in February to live alone in a housing project.  The mother calls the patient three times a day to check on her.  The mother reports that the patient was not acting right since the day prior to admission.  A friend went over to see [name], and found her confused on the couch.  EMS was called who evaluated the patient in the field and left _______ times when the patients friend saw the patient seize.  The patient denies any current alcohol use.  She says that she quit one year ago.  In March 2002, her admission ETOH level was 21.  PAST MEDICAL HISTORY: 1. Alcohol abuse. 2. Tobacco abuse. 3. Hypertension. 4. Arthritis.  SOCIAL HISTORY:  The patient lives alone since February 2002, and is on disability for an unknown reason.  The patient uses alcohol as above.  No IV drug use per the patients mother.  PHYSICAL EXAMINATION:  VITAL SIGNS:  Temperature 99.7 degrees,  heart rate 147, blood pressure 161/101, respirations 16, 98% saturation on room air.  GENERAL:  Twitchy but sleepy.  In no acute distress.  HEENT:  Sclerae icteric.  Large scalp hematoma on the  right occiput.  NECK:  Supple, no lymphadenopathy, no thyromegaly.  CARDIOVASCULAR:  Tachycardic, regular, no murmurs, rubs, or gallops. No jugular venous distention.  LUNGS:  Clear to auscultation bilaterally.  ABDOMEN:  Positive bowel sounds, soft, mild distention.  A palpable liver edge 4.0 to 5.0 cm below the costal margin.  No splenomegaly.  SKIN:  Mild jaundice.  EXTREMITIES:  No edema, positive clubbing, with +2 palpable DP pulses.  NEUROLOGIC:  Arousable, oriented to person, place, and time.  Pupils equal, round, reactive to light.  Extraocular muscles intact.  Moves all four extremities.  Toes downgoing bilaterally.  Deep tendon reflexes +2 in the lower extremities.  The patient was diffusely tremulous, and  there was six to eight beats of horizontal nystagmus.  ADMISSION LABORATORY DATA:  Sodium 136, potassium 2.4, chloride 94, CO2 of 26, BUN 4, creatinine 0.6, glucose 164.  Total protein 6.9, albumin 3.2, AST 352, ALT 82, alkaline phosphatase 196, total bilirubin 1.6.  Alcohol less than 10. Magnesium 0.8.  White blood cell count 8.7, hemoglobin 11.5, MCV 91, platelets 148.  Urinalysis:  15 ketones, 100 glucose, protein greater than 300, nitrite positive, leukocyte esterase negative.  UDS positive for benzodiazepines and barbituates.  A head CT without contrast on admission showed atrophy and scalp hematoma.  No bleed or mass.  Electrocardiogram showed sinus tachycardia at 110, no ST-T wave changes, no Q-waves.  HOSPITAL COURSE: #1 - FEVER:  The patient was admitted to the step-down unit with fever precautions.  Ativan was quickly changed to p.r.n.  The patient had no more seizure activity throughout her hospital stay.  The hospital records were reviewed and she had not had a CT with contrast on her past two admissions for fevers.  A CT with contrast was obtained on January 29, 2001, which showed atrophy but no acute abnormalities.  Although the patient denied alcohol abuse, we had other evidence that stated that she was a heavy drinker.  Her alcohol level was less than 10 on admission, and this was strongly felt to be an alcohol withdrawal fever.  #2 - ALCOHOL ABUSE:  The patient did not experience delirium tremens during her hospital stay.  She required only minimal Ativan throughout her hospital stay.  The patient is in denial of her alcohol abuse.  A referral for treatment will not be successful.  #3 - HYPERBILIRUBINEMIA AND INCREASED ALKALINE PHOSPHATASE:  An abdominal ultrasound was obtained with the results as above.  Repeat laboratory values on January 29, 2001, showed AST 192, ALT 53, alkaline phosphatase 155, total  bilirubin 0.9.  The patient was asymptomatic.  #4 - HYPOKALEMIA  AND HYPOMAGNESEMIA:  The patient had her potassium and magnesium repleted over her hospital stay.  Prior to discharge her potassium was 5.1 and her magnesium was 1.5.  #5 - ANEMIA WITH HEME-POSITIVE STOOLS:  The patients hemoglobin remained stable throughout her hospital stay.  Her level was 10.5 prior to discharge. It titrated down a bit from admission, but is most likely secondary to hydration.  The patient was started empirically on a proton pump inhibitor. We felt due to her age and her alcohol abuse, that the most likely diagnosis was gastritis; however, due to her age she is also at risk for colon cancer, and needs to be screened for this.  I talked with her extensively about this, and the need for followup.  I am providing her with a telephone number for [facility], to schedule a follow-up appointment for evaluation.  #6 -  SEIZURE:  The patient was admitted with a seizure with a T-Max of 101.7 degrees on hospital day two.  The patient never had a leukocytosis. Antibiotics were never started.  Blood cultures were obtained which were negative.  The patients chest x-ray on admission showed no acute disease. Her repeat chest x-ray showed a questionable left lower lobe infiltrate, versus atelectasis.  This is felt most likely to represent an aspiration pneumonitis.  The patient was asymptomatic.  She had no leukocytosis, and good oxygenation, and her fever defervesced.  The patient was not started on antibiotics during her hospital stay.  The patient did have pyuria on admission, and on a repeat urinalysis.  The patient was asymptomatic.  She did not require antibiotics. DD:  01/30/01 TD:  01/30/01 Job: [id number]

## 2011-01-14 NOTE — Discharge Summary (Signed)
NAMEJATZIRI, [name]                           ACCOUNT NO.:  [id number]   MEDICAL RECORD NO.:  [id number]                   PATIENT TYPE:  INP   LOCATION:  5729                                 FACILITY:  [facility]   PHYSICIAN:  Fleet Contras, M.D.                 DATE OF BIRTH:  07-11-1946   DATE OF ADMISSION:  05/14/2003  DATE OF DISCHARGE:  05/19/2003                                 DISCHARGE SUMMARY   PRESENTATION:  Ms. [name] is a 65 year old African American lady with  a past medical history significant for alcoholic liver cirrhosis, chronic  obstructive pulmonary disease, gastroesophageal reflux disease, who was  admitted via the emergency room at [facility] with two  days of nausea, vomiting, diarrhea, feeling weak and fatigued.  Her vomitus  was found to be recently eaten food or drink. No blood. She states she was  unable to keep any food down. She however denied any abdominal pain, chest  pain, or diaphoresis. On evaluation in the emergency room she was found to  be dehydrated with an initial blood pressure of about 90, heart rate of  about 140. She was hospitalized for intravenous fluid and further  evaluation.   HOSPITAL COURSE:  On admission, the patient was started on intravenous  fluids. All diuretics were put on hold. She was placed on a clear liquid  diet, intravenous Protonix. Her electrolytes and renal function were  monitored closely as well as input and output. Her initial laboratory  studies showed white cell count of 14.5, hemoglobin 10.9, hematocrit 32.5,  platelet count 221,000. Sodium 135, potassium 3.4, chloride 83, bicarbonate  35, BUN 12, creatinine 3.0, and glucose 206. Amylase was elevated and lipase  was normal. She subsequently developed a temperature of 101.0 degrees  Fahrenheit the day after. The source of fever was thought to be due to UTI,  bronchitis, or pneumonia. Urinalysis was positive for 3-6 wbc's, positive  leukocytes.  She was therefore started on intravenous ciprofloxacin. White  cell count level came down with resolution of fever. On May 19, 2003,  the patient was thought to be feeling much better, able to tolerate full  oral intake and wanted to go home. Her BUN had dropped to 7 and creatinine  down to 1.0, potassium 4.4.   DISCHARGE DIAGNOSES:  1. Urinary tract infection.  2. Liver cirrhosis.  3. Chronic obstructive pulmonary disease.  4. Dehydration, all of which had improved.   DISCHARGE MEDICATIONS:  1. Lasix 20 mg once a day.  2. Aldactone 50 mg once a day.  3. Reglan 10 mg q.i.d. a.c. and h.s.  4. Thiamine 100 mg daily.  5. Ciprofloxacin 250 mg b.i.d. for five more days.  6. Prevacid 30 mg daily.   She is to follow up with Dr. Concepcion Elk in one week.  Fleet Contras, M.D.   EA/MEDQ  D:  09/07/2003  T:  09/08/2003  Job:  [id number]

## 2011-01-14 NOTE — Discharge Summary (Signed)
[name], [name]               ACCOUNT NO.:  [id number]   MEDICAL RECORD NO.:  [id number]          PATIENT TYPE:  INP   LOCATION:  1419                         FACILITY:  [facility]   PHYSICIAN:  Fleet Contras, M.D.    DATE OF BIRTH:  Nov 02, 1945   DATE OF ADMISSION:  05/06/2008  DATE OF DISCHARGE:  05/12/2008                               DISCHARGE SUMMARY   HISTORY OF PRESENT ILLNESS:  Please see the dictated H and P for full  details.  In summary, [name] is a 65 year old African American lady  with multiple medical problems including liver cirrhosis, degenerative  arthritis of the hips, chronic obstructive pulmonary disease, type 2  diabetes mellitus, chronic pancreatitis, allergic rhinitis,  osteoporosis.  She came to the emergency room at [facility]  with a 1 day history of fall at home when she went to the bathroom  because of weakness or fatigue following passage of bright red blood in  the toilet.  She had no abdominal pain, nausea or vomiting.  She denied  any use of alcohol or nonsteroidal anti-inflammatory drugs.  She had no  fevers or chills.  In the emergency room her vital signs were stable.  She was not in any acute respiratory or painful distress.  Her initial  hemoglobin was 8.7 and she was crossmatched for blood transfusion and  was admitted to the hospital for further evaluation.   HOSPITAL COURSE:  On admission she was transfused with 1 unit of packed  red blood cells.  Her hemoglobin and hematocrit were monitored every 6  hours and she was on IV fluids with D5 normal saline at 150 mL an hour.  Strict I's and O's were monitored.  A gastroenterology consult was  requested.  The patient was noted to have had an EGD done on March 14, 2008, that showed severe distal erosive esophagitis.  She was scheduled  to have a colonoscopy performed on September 18 prior to this admission  and this was therefore planned to be performed on May 07, 2008.  This  showed pancolonic diverticulosis.  EGD was also done and this was  normal.  She was therefore slowly resumed on diet and advance as  tolerated.  Her condition improved and her hemoglobin remained stable at  about 9.  On May 12, 2008, she continued to feel better but her  hemoglobin dropped to 8.5 and she was transfused with 1 more unit of  packed red blood cells before discharge home.   DISCHARGE DIAGNOSES:  1. Pancolonic diverticulosis.  2. Anemia of acute blood loss.  3. Gastrointestinal bleeding.  4. Type 2 diabetes mellitus.  5. Liver cirrhosis.  There were no varices on EGD.  6. Chronic kidney disease.   CONDITION ON DISCHARGE:  Was stable.   DISPOSITION:  Was for home.   DISCHARGE MEDICATIONS:  1. Glimepiride 1 mg daily.  2. Aspirin 81 mg daily.  3. Albuterol nebs 2.5 mg q.6 p.r.n.  4. Prilosec 20 mg daily.  5. Lactulose 30 mL b.i.d.  6. Potassium chloride 10 mEq once a day.  7. Spironolactone  30 mg daily.  8. Lasix 40 mg once a day.  9. Darvon 65 mg b.i.d.  10.Lyrica 75 mg q.h.s.  11.Ambien CR 12.5 mg q.h.s.  12.Cilostazol 100 mg b.i.d.  13.Folic acid 1 mg daily.  14.Thiamine 100 mg daily.  15.Hematinic Plus one p.o. b.i.d.      Fleet Contras, M.D.  Electronically Signed     EA/MEDQ  D:  06/11/2008  T:  06/11/2008  Job:  [id number]

## 2011-01-14 NOTE — Discharge Summary (Signed)
[place]  Patient:    [name], [name]                        MRN: [id number] Adm. Date:  [id number] Disc. Date: [id number] Attending:  Jetty Duhamel T                           Discharge Summary  PRIMARY CARE PHYSICIAN:  [facility].  DISCHARGE DIAGNOSES: 1. Alcohol withdrawal seizure. 2. Hypokalemia, hypomagnesemia, volume depletion, all related to alcohol    abuse. 3. Alcohol withdrawal syndrome. 4. Alcohol related hepatitis. 5. Normocytic anemia. 6. Hypertension, well controlled with beta blocker. 7. Malnutrition. 8. Urinary tract infection, uncomplicated.  DISCHARGE MEDICATIONS: 1. Multivitamin one q.d. 2. Lopressor 50 mg 1/2 tablet b.i.d. 3. Magnesium oxide 400 mg one tablet q.i.d. 4. Septra DS one b.i.d. x 2 days, then stop.  CONSULTATIONS:  None.  PROCEDURES:  CT scan of the head on 11/19/00, revealing no visible infarct, hemorrhage, mass effect, or hydrocephalus, with bony windows unremarkable.  FOLLOWUP: 1. The patient will follow up at [facility] on December 11, 2000, at 11:15 a.m. for general medical recheck. 2. The patient is given the number for alcoholic drug services outpatient center, and is encouraged by the physician staff, as well as care management to attend courses to continue abstinence from alcohol.  HISTORY OF PRESENT ILLNESS:  [name] is a pleasant 65 year old female who on day of admission was at home watching television, eating soup, when she lost consciousness and later woke up and found herself in the emergency room department.  Witnesses stated that she had gone into a generalized motor seizure, called EMS, and transported her to the emergency room.  Due to her seizure and confused mental status, the patient was admitted to acute unit for further workup.  HOSPITAL COURSE:  #1 - ALCOHOL RELATED SEIZURE:  A full history and physical from the patient, and particularly her  family on admission, revealed a past medical history of heavy drinking.  The patient herself, upon awakening, stated that she had not been drinking for the last 2 to 3 days, and denied use over multiple years. Nevertheless, an alcohol level on admission was elevated as proof of her consumption of alcohol.  In the setting of ongoing alcohol abuse, and a new onset seizure, it was felt that this most likely represented alcohol withdrawal seizure.  CT scan of the head was obtained and was unremarkable. Given the strong evidence to support alcohol use and addiction, and other clinical signs and symptoms of alcohol withdrawal, no further workup was felt to be necessary to evaluate the patients seizures at this time.  It was thought that if she should experience a second seizure outside the influence of alcohol, that a full workup for epileptiform focus should be pursued at that time.  The patient was observed on seizure watch throughout hospitalization, and had no repeat seizure activity.  Mental status returned to normal after the postictal state, and there were no further neurologic impairments.  #2 - ELECTROLYTE ABNORMALITIES AND DEHYDRATION:  At the time of admission it was clear that the patient was volume depleted by physical exam, as well as laboratory evaluations.  The patient was suffering from hypomagnesemia as well as hypokalemia.  This was all felt to be consistent with malnutrition and alcoholism.  These electrolytes were supplemented during hospitalization.  At the time of  discharge potassium was corrected, but magnesium continued to be somewhat decreased, though not at a dangerous level.  The patient was discharged with a prescription for oral magnesium therapy.  It is recommended that a repeat magnesium level be obtained in her followup at the [facility].  #3 - ELEVATED TRANSAMINASES:  At admission, the patient was noted to have elevated transaminases with a  SGOT of 240, and SGPT of 74.  This elevation was consistent with an alcohol induced pattern of hepatitis.  Transaminases were followed throughout hospitalization and did improve as the patients abstinence continued.  Given her alcoholism, she was felt to likely be at risk for viral hepatitis, and a viral hepatitis panel was obtained.  At this time it was negative.  As a result, it was felt that the patients combination of negative viral hepatitis panel, as well as the fact that the patients elevated transaminases were returning to normal during the hospitalization, was felt to be evidence that this likely did represent simple alcohol related hepatitis.  It is recommended that hepatic function panel be followed up as an outpatient to assure that the patients hepatic enzymes return to a normal level when abstaining from alcohol.  #4 - ALCOHOL WITHDRAWAL SYNDROME:  On admission the patient was intermittently confused and somewhat agitated.  This was felt to all be consistent with entire syndrome, to include alcohol withdrawal seizure, and full blown alcohol withdrawal.  She was placed on a Librium taper and tolerated this medication well.  She remained calm on the Librium taper and was without significant complicating developments.  As the Librium taper was decreased, mental status cleared, and the patient became appropriate to person, place, and time, as well as situation.  At this time, extensive counseling was begun by the physician and staff regarding alcoholism, and the detrimental effects of ongoing alcohol abuse on ones health.  The patient was strongly encouraged that she must discontinue alcohol consumption, and was advised that this would likely lead to her early death if she did not do such.  She was provided with numbers for alcohol and drug services, and did verbally contract to contact [facility] for followup for outpatient care.  #5 - NORMOCYTIC ANEMIA:  During hospitalization the  patient was noted to have a hemoglobin of 9.5 to 10, MCV around 90.  This was felt to be secondary to bone marrow suppression from ongoing alcohol abuse.  It is recommended this be  followed up routinely on an outpatient basis.  #6 - HYPERTENSION:  At the time of admission the patient carried a long-standing history of hypertension.  She was on atenolol at 25 mg p.o. b.i.d. and tolerating this medication well.  This was continued throughout hospitalization, and blood pressure was extremely well controlled with no acute complications encountered.  DISPOSITION:  On March 28, the patient was cleared for discharge, in that she was ambulating in the hallway without difficulty, was alert and oriented x 3. Given her propensity for alcohol abuse and her recent seizure, she was informed that she should spend the night in the company of another adult for 2 to 3 nights following her discharge.  She agreed to this, and arrangements were made by the patient to sleep at her mothers home for the next 2 to 3 days to assure that she did have appropriate supervision.DD:  11/23/00 TD:  11/25/00 Job: [id number]

## 2011-01-14 NOTE — H&P (Signed)
NAMEFAITHE, ARIOLA                           ACCOUNT NO.:  [id number]   MEDICAL RECORD NO.:  [id number]                   PATIENT TYPE:  INP   LOCATION:  6706                                 FACILITY:  [facility]   PHYSICIAN:  Fleet Contras, M.D.                 DATE OF BIRTH:  [birth year]   DATE OF ADMISSION:  09/08/2003  DATE OF DISCHARGE:                                HISTORY & PHYSICAL   PRESENTING COMPLAINT:  1. Nausea and vomiting.  2. Feeling unwell.   HISTORY OF PRESENTING COMPLAINT:  Ms. Lowenstein is a 65 year old African-  American lady who was admitted via the emergency room at [facility]  with complaints of generally feeling unwell over the last few days. She had  also complaints of poor appetite and inability to tolerate full oral intake.  She developed some nausea and vomiting of recently eaten food and drink. She  denied any vomiting of blood. She denied any abdominal pain, diarrhea, or  constipation. She had no blood in her stools. She had also become  progressively more weak, fatigued, and lacked energy. She denied any chest  pain, shortness of breath, orthopnea, or PND.   REVIEW OF SYSTEMS:  RESPIRATORY:  She has no cough, sputum production,  wheezing, or hemoptysis. HEART:  She has no chest pain, shortness of breath,  orthopnea or PND. NEUROLOGICAL:  She had no dizziness, syncope, focal  weakness or tremors. GENITOURINARY:  She denies any frequency, dysuria,  hematuria or nocturia.   PAST MEDICAL HISTORY:  1. Significant for chronic liver disease with cirrhosis.  2. Chronic alcohol abuse. The patient states that she has been sober for     over a year, but her home health thinks that she is still drinking     alcohol.  3. Chronic obstructive pulmonary disease due to heavy tobacco use.  4. Arthritis of the knees.   PAST SURGICAL HISTORY:  Left ankle surgery for benign tumor.   FAMILY AND SOCIAL HISTORY:  Ms. Eugene lives by herself. She is currently  separated. She has a home health that comes to see her daily for two to four  hours. She continues to smoke one to two packs of cigarettes per day. She  has done this for several years. As stated above, she stated that she has  been sober from alcohol for over a year, but this is debatable. She denies  any illicit drug use. Her mother and father are well and alive. She has  three siblings one of which has chronic leg pain, one has asthma and  obesity. She has no children.   MEDICATIONS:  1. Currently, she is on Combivent MDI two puffs q.i.d.  2. K-Dur 20 mEq one a day.  3. Furosemide 40 mg one a day.  4. Prevacid 30 mg one a day.  5. Spironolactone 50 mg one a day.  6. Lactulose 30 cc  b.i.d. p.r.n.  7. Thiamine 100 mg q.d.  8. Reglan 10 mg t.i.d. p.r.n.  9. Miacalcin nasal spray alternate nostril daily.  10.      Albuterol nebulized 3 mg one unit dose q.6h. p.r.n.   ALLERGIES:  She is allergic to PENICILLIN which causes her to have hives.   PHYSICAL EXAMINATION:  GENERAL:  She is not in acute respiratory or painful  distress. She is not pale. She is mildly icteric. She is chronically ill  looking.  VITAL SIGNS:  Her vital signs are stable. She is afebrile.  NECK:  Supple with no elevated JVD. No cervical lymphadenopathy.  HEENT:  Head is normocephalic, atraumatic. She has some mild congestion of  her nasal mucosa without discharge. Her mouth looks normal with some decayed  teeth.  LUNGS:  Clear to auscultation with no added sounds.  HEART:  Sounds 1 and 2 are heard with no murmurs. No S3 gallops.  ABDOMEN:  Obese, soft, nontender. There is no masses. There is mild ascites  with no definite fluid thrill or shifting dullness. No palpable mass are  felt. Bowel sounds are present.  EXTREMITIES:  Showed no edema or ulcerations. There is no calf tenderness or  swelling.  NEUROLOGICAL:  She walked with a walker. She moves all extremities normally.  Muscle strength is fairly normal.  There are no focal neurological deficits.   LABORATORY DATA:  Initial laboratory data obtained at the emergency room  showed white cell count of 10.2, hemoglobin 11.1, hematocrit 32.6, platelet  count 200. Her sodium is 132, potassium 4.0, chloride 96, bicarbonate of 26,  glucose of 172, BUN 18, creatinine 1.3. Bilirubin is elevated 1.9, alkaline  phosphatase 122, AST 69, ALT 30, albumin 3.7. Ammonia level is 51.  Urinalysis is positive for small leukocytes, 7 to 10 WBCs, and Trichomonas.  Chest x-ray performed September 07, 2003 on her previous visit to the emergency  room showed COPD with thoracic spine compression fractures and healing right  7th rib fracture.   ASSESSMENT:  Ms. Behrmann is a 65 year old African-American lady with past  medical history significant for alcoholic liver cirrhosis and chronic  obstructive pulmonary disease. She presents today to the emergency room with  fatigue, weakness, nausea, and vomiting. Initial laboratory studies do show  decompensated liver cirrhosis with elevated bilirubin, alkaline phosphatase  and SGOT. She also has evidence of urinary tract infection on urinalysis.  She is therefore being admitted to [facility] medical bed for  evaluation of appropriate therapy.   ADMISSION DIAGNOSES:  1. Decompensated liver cirrhosis.  2. Nausea and vomiting likely due to gastritis.  3. Urinary tract infection.   PLAN OF CARE:  The patient will be started on IV fluids with D5 half-normal  saline at __________ an hour. She will be on clear liquid diet, 2-g sodium  diet. Daily weights and input and output should be monitored. She will be  started on intravenous ciprofloxacin 200 mg q.12h., IV Protonix 40 mg daily.  Her other home medications will be continued as listed above. Treatment will  be dependent on her response to the above plan and results of further  investigations.                                               Fleet Contras, M.D.     EA/MEDQ  D:  09/08/2003  T:  09/08/2003  Job:  [id number]

## 2011-01-14 NOTE — Discharge Summary (Signed)
[name], [name]                           ACCOUNT NO.:  [id number]   MEDICAL RECORD NO.:  [id number]                   PATIENT TYPE:  INP   LOCATION:  5708                                 FACILITY:  [facility]   PHYSICIAN:  Fleet Contras, M.D.                 DATE OF BIRTH:  Oct 28, 1945   DATE OF ADMISSION:  10/31/2002  DATE OF DISCHARGE:  11/08/2002                                 DISCHARGE SUMMARY   ADMISSION DIAGNOSES:  1. Peripheral edema.  2. Anxiety.  3. Chronic liver disease.  4. Hypoalbuminemia.   DISCHARGE DIAGNOSES:  1. Colic liver disease.  2. Esophageal varices/hiatus hernia.  3. Anxiety due to portal hypertension.  4. Anemia of chronic disease.   HISTORY OF PRESENT ILLNESS:  The patient is a 65 year old African-American  lady who was admitted to the office from medical clinic on the morning of  October 31, 2002, with complaints of bilateral leg swelling, abdominal  swelling, leg pains, and fatigue.  She denied any jaundice, nausea, or  ________ before admission.  She complained of fatigue, malaise, anorexia,  and progressive swelling of the legs up to the thighs as well as distention  of the abdomen without any pain.   PHYSICAL EXAMINATION:  GENERAL:  She was slightly icteric.  VITAL SIGNS:  She has gained about 26 pounds of body weight in a couple of  months.  She had edema of the abdomen which showed some dullness.  The liver  was palpable about 4 cm below the costal margin. She has 2+ pitting edema of  the lower extremities up to the groin bilaterally with bilateral  varicosities.  She could hardly ambulate with a waddling slow gait. She was  therefore admitted to the hospital for further evaluation and appropriate  therapy.   HOSPITAL COURSE:  On admission, the patient's initial laboratory data showed  bilirubin of 3.5, alkaline phosphatase of 409, AST 206, ALT 48, albumin 2.6,  calcium 6.4.  The rest of her chemistries were essentially normal.  She was  started on fluid restriction about 1 liter per day.  CT scan of abdomen and  pelvis was requested and the gastroenterology consult was also called.  She  was started on gentle diuretics with intravenous Lasix as well as oral  Aldactone. She was placed on Lovenox 30 mg once a day for DVT prophylaxis.  She received Oxycodone 5 mg one to two tablets q.6h for pain control.  She  was also started on Thiamine and folic acid. She was monitored closely for  incipient delirium tremens due to her history of alcohol use. The patient  was examined by gastroenterology, Dr. Ewing Schlein, because ___________ most likely  due to portal hypertension.  He then proceed to perform an upper endoscopy  which showed moderate to large hiatus hernia with erosions at the  gastroesophageal junction. There were also fundal varices and jejunal  edematous folds  with no mass.  The patient had an episode of confusion and  disorientation while hospitalized and this was felt to be due to hepatic  encephalopathy and she responded well to dosing of Lactulose. CT scan was  negative for any acute lesion, but she had cerebellar atrophy. She continued  to improve during the course of admission, was able to tolerate more oral  intake with reduction in the peripheral edema as well as arthritis.  On  November 08, 2002, she was able to ambulate with a walker in the hallway.  Her  vital signs were stable.  Chest was clear to auscultation.  She has moderate  ascites in her abdomen and 1+ pitting edema of the lower extremities. She  was considered stable for discharge home.   DISCHARGE MEDICATIONS:  1. Combivent MDI two puffs q.6h p.r.n.  2. KCL 10 mEq once a day.  3. Protonix 40 mg daily.  4. Thiamine 100 mg daily.  5. Aldactone 100 mg daily.  6. Lasix 40 mg b.i.d.  7. Multivitamin once a day.  8. Lactulose 30 mL b.i.d. p.r.n.   She was continued on fluid restriction of 1 liter per day and low sodium  diet.  She is to follow up with Fleet Contras, M.D., [phone], in one week.                                               Fleet Contras, M.D.    EA/MEDQ  D:  01/21/2003  T:  01/21/2003  Job:  [id number]

## 2011-01-14 NOTE — H&P (Signed)
[facility]  Patient:    [name], [name]                        MRN: [id number] Adm. Date:  [id number] Attending:  Anastasio Auerbach CC:         [facility]   History and Physical  CHIEF COMPLAINT: [name] is a 65 year old black female admitted with seizures x 2.  HISTORY OF PRESENT ILLNESS: She states she was watching television at approximately 11 p.m. tonight (November 18, 2000) and was eating soup, and she thought it tasted funny.  It was homemade by her aunt.  The next thing she knew she was in the emergency department.  She had a seizure witnessed by family and it was a generalized motor seizure.  After arrival in the emergency room she was evaluated by the EDP and had a normal head CT except for atrophy. Her magnesium level was low at 0.4 and 4 g of magnesium sulfate was ordered. She had another witnessed seizure in the emergency department.  She complains of chest pain that started about 30 minutes prior to my arrival. The pain radiates into her back and is worse with a deep breath and movement. The pain is a pressure sensation in the mid chest and the chest is tender to palpation.  This discomfort started after her second seizure.  She states that she drank heavy lifting in the past; however, she says she has had nothing to drink in the last two years.  Upon confrontation that her alcohol level was 21 mg/dl and that she smelled of alcohol she maintained that she had had nothing to drink for the last two years.  She wondered if there was alcohol in the funny tasting soup.  PAST MEDICAL HISTORY:  1. Hypertension.  2. Arthritis, worse in hands and shoulders, especially at night.  CURRENT MEDICATIONS:  1. Arthrotec q.d.  2. Blood pressure medications, the name of which she does not know.  ALLERGIES: PENICILLIN (rash).  PAST SURGICAL HISTORY:  1. Hysterectomy (bleeding).  2. Tumor in left ankle (benign).  SOCIAL HISTORY: She is separated  and lives alone.  She is in the process of moving.  She states she smokes occasionally, with a packs of cigarettes lasting up to one month.  Alcohol consumption - see above.  FAMILY HISTORY: Her father is apparently alive and well.  Her mother has hypertension.  Siblings are alive and well.  REVIEW OF SYSTEMS: She wears reading glasses.  All other systems are negative.  PHYSICAL EXAMINATION: GENERAL: She is slightly chronically ill-appearing.  VITAL SIGNS: Blood pressure 147/100.  Pulse 100-110 with sinus mechanism.  SKIN: Warm and dry, with possible palmar erythema.  HEENT: PERRL. EOMI.  Funduscopic examination normal.  Ears reveal wax bilaterally.  Nose and throat unremarkable.  NECK: Supple.  Thyroid not enlarged or tender.  CHEST: Clear to auscultation and percussion.  The anterior chest wall is tender with mild to moderate back tenderness noted as well.  ABDOMEN: Slightly protuberant.  No fluid wave.  CARDIAC: Regular rate and rhythm with normal S1 and S2, without S3 or S4, without murmur, rub, or click.  NEUROLOGIC: Oriented x 3.  Cranial nerves intact.  Motor 5/5.  Sensation normal to light touch.  LABORATORY DATA: Magnesium level 0.4.  Amylase 221, lipase 18.  Hemoglobin 11.2, WBC 7200.  Alcohol level 21 mg/dl.  Bilirubin 1, alkaline phosphatase 171, AST 432, ALT 92, albumin 2.7, calcium 6.5.  Sodium 131, potassium 2.4, chloride 92, CO2 28, BUN 3, creatinine 0.4.  X-ray examination included CT scan of the head, which is negative, and chest x-ray which shows no acute disease.  EKG shows sinus tachycardia with no acute changes, and this was done with chest discomfort.  IMPRESSION:  1. Seizures.  2. History of alcohol abuse.  3. Alcohol level 21 mg/dl.  4. Hypomagnesemia with hypokalemia.  5. Hypertension.  6. Chest pain consistent with musculoskeletal discomfort.  7. Thoracic back pain consistent with musculoskeletal pain.  8. "Arthritis."  9. Elevated liver  function tests.  The most likely explanation of all this is that she has had two alcohol withdrawal seizures with back and chest pain secondary to musculoskeletal discomfort after the seizure.  The hypomagnesemia with concomitant hypokalemia and hypocalcemia is likely secondary to alcohol abuse.  Liver function tests are also up secondary to this.  Interestingly, she continues to steadfastly maintain that she has not consumed alcohol in the last two years.  PLAN: Thiamine, Librium, magnesium replacement, potassium replacement.  Admit to ICU to look for other alcohol withdrawal symptoms and will need to find out what her blood pressure medications are. DD:  11/19/00 TD:  11/20/00 Job: [id number]

## 2011-01-14 NOTE — Op Note (Signed)
[name], [name]                           ACCOUNT NO.:  [id number]   MEDICAL RECORD NO.:  [id number]                   PATIENT TYPE:  INP   LOCATION:  5708                                 FACILITY:  [facility]   PHYSICIAN:  Petra Kuba, M.D.                 DATE OF BIRTH:  1946/06/20   DATE OF PROCEDURE:  11/06/2002  DATE OF DISCHARGE:                                 OPERATIVE REPORT   PROCEDURE:  Esophagogastroduodenoscopy.   INDICATIONS:  Abnormal CT and anemia.   DESCRIPTION OF PROCEDURE:  The video endoscope was inserted.  Her esophagus  was foreshortened due to a moderately large hiatal hernia.  There was no  sign of any significant abnormality except for with passing the scope, a  small posterior pharyngeal midline nodule just past the base of her tongue  was seen.  It did not have any worrisome stigmata but will need an ENT  evaluation in the future.  She did have the moderately large hiatal hernia  with a few small erosions at the GE junction, compatible with [facility]  lesions.  No esophageal varices were seen.  The scope was inserted into the  stomach, advanced through a normal antrum, normal pylorus, to a normal  duodenal bulb.  As we advanced around the C-loop, it did seem to be more  edematous than normal but no obvious compression or mass lesion and maybe  the folds were slightly edematous, but we were able to advance to a normal  second portion of the duodenum.  No abnormality was seen distally.  The  scope was slowly withdrawn back to the bulb, and a good look there ruled out  ulcers in that location.  The scope was withdrawn back to the stomach and  retroflexed.  High in the cardia the hiatal hernia was confirmed.  The  fundus was examined then for probable fundic varices versus edematous folds.  The scope was straightened, and straight visualization of the stomach did  not reveal any additional findings.  The scope was then slowly withdrawn,  which confirmed the  moderately large hiatal hernia pouch.  The Sheria Lang  lesions were confirmed.  Again no esophageal varices or other esophageal  abnormalities were seen.  The scope was withdrawn.  The patient tolerated  the procedure well.  There was no obvious immediate complication.   ENDOSCOPIC DIAGNOSES:  1. Moderately large hiatal hernia with erosions at the gastroesophageal     junction, compatible with Sheria Lang lesions.  2. Questionable fundic varices versus edematous folds.  3. Some duodenal edematous folds but no masses.  4. Posterior pharyngeal small nodule.  5. Otherwise normal esophagogastroduodenoscopy.    PLAN:  1. Would continue pump inhibitors long-term because of her hiatal hernia.  2. ENT consult probably as an outpatient, as they prefer when she is better.  3. Consider Inderal in the future to decrease chance of bleeding.  Petra Kuba, M.D.    MEM/MEDQ  D:  11/06/2002  T:  11/07/2002  Job:  [id number]   cc:   Fleet Contras, M.D.  [address]  [place]  [place] [address]  Fax: [phone]

## 2011-01-14 NOTE — H&P (Signed)
[name], [name]                           ACCOUNT NO.:  [id number]   MEDICAL RECORD NO.:  [id number]                   PATIENT TYPE:  INP   LOCATION:  5705                                 FACILITY:  [facility]   PHYSICIAN:  Fleet Contras, M.D.                 DATE OF BIRTH:  04/17/1946   DATE OF ADMISSION:  10/31/2002  DATE OF DISCHARGE:                                HISTORY & PHYSICAL   CHIEF COMPLAINT:  1. Bilateral leg swelling.  2. Abdominal swelling.  3. Leg pain.  4. Fatigue.   HISTORY OF PRESENT ILLNESS:  The patient is a 65 year old African-American  lady who was seen in the office of [facility] this morning with  the above complaints.  This has been progressive over the last two months.  Initially she started with swelling of the ankles and this has progressed up  to her thighs and now is into her abdomen.  The abdomen has also become  distended, although, she denies any pain. She has pain of the legs and  thighs with thinning of the skin of the legs.  She has been taking Lasix and  Aldactone at home without much relief of the swelling.  She has associated  nausea and has vomited over the last couple of days recently eating food.  She has fatigue, malaise, she is anorexic and she has no yellowing of her  eyes. She feels bloated in her abdomen. She denies any fevers, difficulty in  breathing or swallowing. She has no myalgias, hematemesis, diarrhea, or  constipation. She has no blood in her stools.  She says she has difficulty  walking at home and she has fallen about three times in the past week due to  the heaviness of her legs.  Once she sits, she finds it difficult to rise  from the sitting position.   REVIEW OF SYSTEMS:  RESPIRATORY: She has no cough, sputum production,  wheezing, or hemoptysis. HEART: She has no chest pain, shortness of breath,  orthopnea, or PND. NEUROLOGY: She has no dizziness, syncope, focal weakness,  or tremors.  GENITOURINARY: She  has no frequency, dysuria, hematuria, or  nocturia.   PAST MEDICAL HISTORY:  Significant for:  1. 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  2. Arthropathy with arthritis of the knee.  3. Chronic obstructive pulmonary disease mainly due to heavy tobacco abuse.  4. Liver disease, most likely due to alcohol abuse.  5. Chronic alcohol abuse.   PAST SURGICAL HISTORY:  Left ankle surgery for benign tumor.   FAMILY HISTORY:  She was born here in [place].  She completed 10 grades  of education. She is currently separated. She lives alone now and has a home  health aide that comes in two to four hours a day.  She has worked in [employer] and then also in [employer] at [employer] until the  mid-90s. She is  currently unemployed.  She still smokes one to two cigarettes per day and  she has done this for over 45 years. She used to drink alcohol heavily, but  denies any recent use.  Denies any illicit drug abuse.  Her mother and  father are alive and well.  She has three siblings, one with leg pain and  one with asthma and obesity. She has no children.   PHYSICAL EXAMINATION:  GENERAL: She is sitting in chair not in acute  respiratory, but in mild painful distress.  She is chronically ill-looking  with appropriate affect.  She appears to be older than her stated age.  VITAL SIGNS:  Blood pressure 120/80, heart rate 96 and regular, respiratory  rate 20, her weight was 130 pounds.  Of note, she has increased her weight  from 104 pounds in January to 130 pounds today, a gain of 26 pounds in less  than two months.  HEENT:  Head is normocephalic and atraumatic.  Eyes; there is icterus of the  sclerae.  ENT; no congestion of the nose. Her mouth looks normal with  decayed teeth.  NECK:  Supple with no elevated JVD, no cervical lymphadenopathy. Carotid  bruits are absent.  LUNGS:  Clear to A&P bilaterally with no sounds.  HEART: Heart sounds 1 and 2 are heard with no murmurs.  ABDOMEN: Obese. There  is pitting edema of the skin of the abdomen.  There  are no obvious scars, no hernias, no bruit. No tenderness, guarding, or  rebound. Liver edge is palpable about 4 cm below the costal margin.  Spleen  is not palpable. There is marked ascites of the abdomen with shifting  dullness.  Peripheral pulses are present bilaterally with no bruits.  EXTREMITIES:  2+ pitting edema of the lower extremities up to the groin  bilaterally. There is bilateral varicosities with associated scaling of the  skin of the legs.  NEUROLOGY:  She walks with a waddling, slow gait. She moves all of her  extremities normally and her muscle strength is fairly normal.   MEDICATIONS:  1. Combivent MDI two puffs q.i.d.  2. Celebrex 200 mg once a day.  3. K-Dur 10 mEq once a day.  4. Furosemide 40 mg b.i.d.  5. Aldactone 25 mg daily.  6. Prevacid 30 mg daily.   ALLERGIES:  PENICILLIN which causes hives.   LABORATORY DATA:  Her most recent laboratory data obtained in the office was  from September 20, 2002, which showed total bilirubin of 3.5, alkaline  phosphatase 409, AST 206, ALT 48.  Her albumin was 2.6 and calcium was 6.4.  Her sodium, potassium, chloride, glucose, and BUN were essentially normal.  Her creatinine was 0.4.   ASSESSMENT:  Ms. [name] is a 65 year old African-American lady with  past medical history of chronic heavy alcohol use and over the last couple  of months, she has developed progressive swelling and pain of the lower  extremities and her abdomen.  She has a palpable, enlarged liver. The  presence of ascites, hepatomegaly, peripheral edema, and abnormal liver  function tests is highly suspicious for liver cirrhosis.  The patient has  been encouraged over the last few weeks to cease from alcohol use, but it is  doubtful if she actually stopped altogether.  Attempts have been made for her to be seen by gastroenterology, but she has some trouble with her  finances and has not been able  to make that referral. She is  therefore being  admitted to [facility] for further investigations and treatment of  her refractory edema of her lower extremities and the ascites.   ADMISSION DIAGNOSES:  1. Peripheral edema.  2. Ascites.  3. Alcoholic liver disease.  4. Hypoalbuminemia.   PLAN:  She will be admitted to a medical bed to be placed on 3 gram sodium  diet with fluid restriction to 1 liter per day.  Vital signs q.4h.  Daily  weight with strict input and output monitoring.  Further testing including  CBC, CMET, PT, PTT, EKG, urinalysis, stool hemoccults, and also a CT scan of  the abdomen and pelvis will be performed.  Consult from gastroenterology  will be requested. She will be continued on her home medications except  Lasix will be changed to intravenous route.  Protonix will be substituted  for Prevacid.  If her PT and PTT are normal, she will be started on Lovenox  30 mg once a day. She will receive Oxycodone 5 mg one to two tablets q.6h  for pain. She will be started on Thiamine 100 mg daily and Folic acid 1 mg  daily.  She will be monitored closely for delirium tremens assuming that she  has been drinking alcohol.  The patient will be treated if needed.  This  plan of care has been explained to the patient and her brother; and accepted  by them and all of their questions answered.                                               Fleet Contras, M.D.    EA/MEDQ  D:  10/31/2002  T:  10/31/2002  Job:  [id number]

## 2011-01-14 NOTE — Discharge Summary (Signed)
[name], [name]                           ACCOUNT NO.:  [id number]   MEDICAL RECORD NO.:  [id number]                   PATIENT TYPE:  INP   LOCATION:  6706                                 FACILITY:  [facility]   PHYSICIAN:  Fleet Contras, M.D.                 DATE OF BIRTH:  19-Feb-1946   DATE OF ADMISSION:  09/08/2003  DATE OF DISCHARGE:  09/11/2003                                 DISCHARGE SUMMARY   ADMISSION DIAGNOSES:  1. Alcoholic liver cirrhosis.  2. Gastritis.  3. Urinary tract infection.   DISCHARGE DIAGNOSES:  1. Gastritis secondary to alcohol abuse.  2. Urinary tract infection.  3. Decompensated liver cirrhosis.  4. Chronic obstructive pulmonary disease.   HISTORY OF PRESENT ILLNESS:  [name] is a 65 year old African-American  lady admitted ___________ [facility] where she presented with  complaints of feeling unwell associated with poor appetite, not being able  to tolerate full oral intake. She had some nausea and vomiting of recently  eaten food and drink, but there was no blood. She continues to deny use of  alcohol but this is highly suspicious for her presentation.   HOSPITAL COURSE:  On admission, she was noted to have a white cell count of  10.2, a hemoglobin 11.1, platelet count 200. Sodium was 132, potassium 4.0,  chloride 96, bicarbonate 26, glucose 152, BUN 18, creatinine 1.3. Bilirubin  was elevated at 1.5, AST 69, ALT 30, albumin 3.7. Ammonium level was 51. She  initially received IV fluids with D5 half normal saline at 25 cc an hour.  She was placed on a clear liquid diet with low sodium. Intravenous Protonix  40 mg once a day, and she was started on IV ciprofloxacin 200 mg q.12h. for  urinary test positive for small leukocytes, 7 to 10 WBCs. Ms. Rabold  significant progress during the course of hospitalization with improvement  in her oral intake with no further episodes of nausea or vomiting. On  September 11, 2003, she was feeling much  better with no new complaints, was up  and about, eating breakfast. Her vital signs were stable. Abdomen was soft,  nontender, no masses. Extremities showed no edema or calf tenderness. She  was thought to be stable for discharge home.   DISPOSITION:  To home.   FOLLOW UP:  Dr. Fleet Contras in one to two weeks.   DISCHARGE MEDICATIONS:  1. Lasix 40 mg once a day.  2. Aldactone 50 mg once a day.  3. K-Dur 10 mEq a day.  4. Lactulose 30 mL b.i.d.  5. ___________ q.d.  6. Reglan 10 mg q.i.d., a.c. and h.s.  7. Protonix 40 mg per day.  8. Albuterol nebulizer dosed q.6h. p.r.n.  9. Ciprofloxacin 250 mg p.o. b.i.d. for 10 days.  Fleet Contras, M.D.    EA/MEDQ  D:  11/24/2003  T:  11/25/2003  Job:  [id number]

## 2011-01-17 ENCOUNTER — Emergency Department (HOSPITAL_COMMUNITY)
Admission: EM | Admit: 2011-01-17 | Discharge: 2011-01-17 | Disposition: A | Discharge status: Home / Self Care | Payer: PRIVATE HEALTH INSURANCE | Attending: Emergency Medicine | Admitting: Emergency Medicine

## 2011-01-17 ENCOUNTER — Emergency Department (HOSPITAL_COMMUNITY): Payer: PRIVATE HEALTH INSURANCE

## 2011-01-17 DIAGNOSIS — S90559A Superficial foreign body, unspecified ankle, initial encounter: Secondary | ICD-10-CM | POA: Insufficient documentation

## 2011-01-17 DIAGNOSIS — S70259A Superficial foreign body, unspecified hip, initial encounter: Secondary | ICD-10-CM | POA: Insufficient documentation

## 2011-01-17 DIAGNOSIS — J4489 Other specified chronic obstructive pulmonary disease: Secondary | ICD-10-CM | POA: Insufficient documentation

## 2011-01-17 DIAGNOSIS — S8000XA Contusion of unspecified knee, initial encounter: Secondary | ICD-10-CM | POA: Insufficient documentation

## 2011-01-17 DIAGNOSIS — W1809XA Striking against other object with subsequent fall, initial encounter: Secondary | ICD-10-CM | POA: Insufficient documentation

## 2011-01-17 DIAGNOSIS — R110 Nausea: Secondary | ICD-10-CM | POA: Insufficient documentation

## 2011-01-17 DIAGNOSIS — E119 Type 2 diabetes mellitus without complications: Secondary | ICD-10-CM | POA: Insufficient documentation

## 2011-01-17 DIAGNOSIS — S92919A Unspecified fracture of unspecified toe(s), initial encounter for closed fracture: Secondary | ICD-10-CM | POA: Insufficient documentation

## 2011-01-17 DIAGNOSIS — R42 Dizziness and giddiness: Secondary | ICD-10-CM | POA: Insufficient documentation

## 2011-01-17 DIAGNOSIS — I10 Essential (primary) hypertension: Secondary | ICD-10-CM | POA: Insufficient documentation

## 2011-01-17 DIAGNOSIS — K219 Gastro-esophageal reflux disease without esophagitis: Secondary | ICD-10-CM | POA: Insufficient documentation

## 2011-01-17 DIAGNOSIS — J449 Chronic obstructive pulmonary disease, unspecified: Secondary | ICD-10-CM | POA: Insufficient documentation

## 2011-01-27 NOTE — Op Note (Signed)
  [name], [name]               ACCOUNT NO.:  [id number]  MEDICAL RECORD NO.:  [id number]           PATIENT TYPE:  O  LOCATION:  WLEN                         FACILITY:  [facility]  PHYSICIAN:  Shirley Friar, MDDATE OF BIRTH:  Mar 06, 1946  DATE OF PROCEDURE: DATE OF DISCHARGE:                              OPERATIVE REPORT   INDICATIONS:  History of polyps.  MEDICATIONS PER ANESTHESIA:  Propofol 500 mg IV, fentanyl 100 mcg IV, Versed 2 mg IV.  FINDINGS:  Rectal examination was unremarkable.  A pediatric colonoscope was inserted into a fair prepped colon and advanced to the cecum where the ileocecal valve and appendiceal orifice were identified.  On insertion there were a few scattered diverticula seen in the sigmoid colon.  In the descending colon was an 8-mm sessile polyp that was removed with snare cautery.  On further insertion in the descending colon was a 7-mm sessile polyp that was removed with snare cautery.  The colonoscope was advanced further and in the ascending colon was evidence of the previous [place] ink tattooing site.  In this area were two 3-mm sessile polyps.  There was also a 2-cm carpet-like polypoid lesion noted that was biopsied for histologic purposes.  Additional [place] ink tattooing was injected around this lesion and approximately 3 cc was injected.  The colonoscope was then advanced to the cecum, which required abdominal pressure and repeated loop reduction to reach the cecum.  On careful withdrawal, the previously noted polypoid lesion was seen.  On further withdrawal, no other mucosal abnormalities were seen. Retroflexion was unsuccessful due to a small rectum and the patient's agitation at the end of the procedure.  On slow withdrawal of the colonoscope, there were small internal hemorrhoids.  ASSESSMENT: 1. Colon polyps - status post snare cautery removal of two of them and     status post biopsy of the ascending colon polyp as stated above. 2.  Diverticulosis. 3. Small internal hemorrhoids.  PLAN: 1. Follow up on pathology. 2. No aspirin products x 14 days. 3. Surgical consult pending the path from the carpet-like polyp.     Shirley Friar, MD     VCS/MEDQ  D:  01/11/2011  T:  01/11/2011  Job:  [id number]  cc:   Fleet Contras, M.D. Fax: [phone]  Electronically Signed by Charlott Rakes MD on 01/27/2011 10:45:20 PM

## 2011-01-27 NOTE — Op Note (Signed)
  [name], [name]               ACCOUNT NO.:  [id number]  MEDICAL RECORD NO.:  [id number]           PATIENT TYPE:  O  LOCATION:  WLEN                         [facility]ACILITY:  [facility]  PHYSICIAN:  Shirley Friar, MDDATE OF BIRTH:  1946-06-05  DATE OF PROCEDURE: DATE OF DISCHARGE:                              OPERATIVE REPORT   PROCEDURE:  Upper endoscopy.  INDICATIONS:  Gastroesophageal reflux disease, history of reflux esophagitis.  MEDICATIONS:  See preceding colonoscopy given by Anesthesia.  FINDINGS:  Endoscope was inserted through oropharynx and the esophagus was intubated which was normal in its entirety.  No esophagitis or esophageal varices were noted.  Endoscope was advanced down to the stomach which revealed normal-appearing gastric mucosa.  Retroflexion was done which revealed normal proximal stomach.  Endoscope was advanced into the duodenal bulb which revealed focal erythematous mucosa consistent with mild duodenitis.  The endoscope was advanced into the second portion of duodenum which was unremarkable.  Endoscope was withdrawn back through the stomach and then the patient developed hypoxia and the endoscope was withdrawn in order to stabilize her respiratory status.  After stabilizing the respiratory status, the endoscope was reinserted and the esophagus was again re-evaluated which revealed no esophageal varices.  Endoscope was then withdrawn to confirm the above findings.  ASSESSMENT:  Mild duodenitis, otherwise normal upper endoscopy.  PLAN:  Continue twice a day omeprazole for esophageal reflux.     Shirley Friar, MD     VCS/MEDQ  D:  01/11/2011  T:  01/11/2011  Job:  [id number]  cc:   Fleet Contras, M.D. Fax: [phone]  Electronically Signed by Charlott Rakes MD on 01/27/2011 10:45:26 PM

## 2011-01-28 ENCOUNTER — Encounter (INDEPENDENT_AMBULATORY_CARE_PROVIDER_SITE_OTHER): Payer: Self-pay | Admitting: General Surgery

## 2011-01-28 DIAGNOSIS — Z9289 Personal history of other medical treatment: Secondary | ICD-10-CM

## 2011-01-28 DIAGNOSIS — D649 Anemia, unspecified: Secondary | ICD-10-CM

## 2011-01-28 DIAGNOSIS — R0989 Other specified symptoms and signs involving the circulatory and respiratory systems: Secondary | ICD-10-CM

## 2011-01-28 DIAGNOSIS — K08109 Complete loss of teeth, unspecified cause, unspecified class: Secondary | ICD-10-CM

## 2011-01-28 DIAGNOSIS — J349 Unspecified disorder of nose and nasal sinuses: Secondary | ICD-10-CM

## 2011-01-28 DIAGNOSIS — G4730 Sleep apnea, unspecified: Secondary | ICD-10-CM

## 2011-01-28 DIAGNOSIS — J441 Chronic obstructive pulmonary disease with (acute) exacerbation: Secondary | ICD-10-CM | POA: Insufficient documentation

## 2011-01-28 DIAGNOSIS — J449 Chronic obstructive pulmonary disease, unspecified: Secondary | ICD-10-CM

## 2011-01-28 DIAGNOSIS — Z972 Presence of dental prosthetic device (complete) (partial): Secondary | ICD-10-CM

## 2011-03-25 ENCOUNTER — Telehealth (INDEPENDENT_AMBULATORY_CARE_PROVIDER_SITE_OTHER): Payer: Self-pay

## 2011-03-25 NOTE — Telephone Encounter (Signed)
I left msg with family member to have pt call our office re: surgery to be scheduled. Her home # has been d/c.

## 2011-04-12 ENCOUNTER — Encounter (HOSPITAL_COMMUNITY)
Admission: RE | Admit: 2011-04-12 | Discharge: 2011-04-12 | Disposition: A | Discharge status: Home / Self Care | Payer: PRIVATE HEALTH INSURANCE | Source: Ambulatory Visit | Attending: General Surgery | Admitting: General Surgery

## 2011-04-12 ENCOUNTER — Ambulatory Visit (HOSPITAL_COMMUNITY)
Admission: RE | Admit: 2011-04-12 | Discharge: 2011-04-12 | Disposition: A | Discharge status: Home / Self Care | Payer: PRIVATE HEALTH INSURANCE | Source: Ambulatory Visit | Attending: General Surgery | Admitting: General Surgery

## 2011-04-12 ENCOUNTER — Other Ambulatory Visit (INDEPENDENT_AMBULATORY_CARE_PROVIDER_SITE_OTHER): Payer: Self-pay | Admitting: General Surgery

## 2011-04-12 ENCOUNTER — Telehealth (INDEPENDENT_AMBULATORY_CARE_PROVIDER_SITE_OTHER): Payer: Self-pay | Admitting: General Surgery

## 2011-04-12 DIAGNOSIS — Z01812 Encounter for preprocedural laboratory examination: Secondary | ICD-10-CM | POA: Insufficient documentation

## 2011-04-12 DIAGNOSIS — Z01818 Encounter for other preprocedural examination: Secondary | ICD-10-CM | POA: Insufficient documentation

## 2011-04-12 DIAGNOSIS — J449 Chronic obstructive pulmonary disease, unspecified: Secondary | ICD-10-CM | POA: Insufficient documentation

## 2011-04-12 DIAGNOSIS — K635 Polyp of colon: Secondary | ICD-10-CM

## 2011-04-12 DIAGNOSIS — J4489 Other specified chronic obstructive pulmonary disease: Secondary | ICD-10-CM | POA: Insufficient documentation

## 2011-04-12 LAB — PROTIME-INR: INR: 1.01 (ref 0.00–1.49)

## 2011-04-12 LAB — URINALYSIS, ROUTINE W REFLEX MICROSCOPIC
Bilirubin Urine: NEGATIVE
Glucose, UA: NEGATIVE mg/dL
Hgb urine dipstick: NEGATIVE
Ketones, ur: NEGATIVE mg/dL
Nitrite: NEGATIVE
Specific Gravity, Urine: 1.016 (ref 1.005–1.030)
pH: 6 (ref 5.0–8.0)

## 2011-04-12 LAB — BASIC METABOLIC PANEL
BUN: 18 mg/dL (ref 6–23)
CO2: 27 mEq/L (ref 19–32)
Chloride: 106 mEq/L (ref 96–112)
Creatinine, Ser: 1.27 mg/dL — ABNORMAL HIGH (ref 0.50–1.10)
GFR calc Af Amer: 51 mL/min — ABNORMAL LOW (ref >60)
Glucose, Bld: 194 mg/dL — ABNORMAL HIGH (ref 70–99)
Potassium: 4.8 mEq/L (ref 3.5–5.1)

## 2011-04-12 LAB — HEPATIC FUNCTION PANEL
ALT: 12 U/L (ref 0–35)
AST: 21 U/L (ref 0–37)
Albumin: 3.2 g/dL — ABNORMAL LOW (ref 3.5–5.2)
Total Protein: 7.4 g/dL (ref 6.0–8.3)

## 2011-04-12 LAB — DIFFERENTIAL
Eosinophils Absolute: 0.1 10*3/uL (ref 0.0–0.7)
Lymphocytes Relative: 25 % (ref 12–46)
Lymphs Abs: 2.7 10*3/uL (ref 0.7–4.0)
Monocytes Relative: 8 % (ref 3–12)
Neutrophils Relative %: 66 % (ref 43–77)

## 2011-04-12 LAB — CBC
HCT: 33.2 % — ABNORMAL LOW (ref 36.0–46.0)
MCH: 24.3 pg — ABNORMAL LOW (ref 26.0–34.0)
MCV: 79 fL (ref 78.0–100.0)
Platelets: 331 10*3/uL (ref 150–400)
RBC: 4.2 MIL/uL (ref 3.87–5.11)

## 2011-04-12 LAB — APTT: aPTT: 35 seconds (ref 24–37)

## 2011-04-12 NOTE — Telephone Encounter (Signed)
[name] with [facility] preadmit called regarding a medication that Mrs. [name] is on Cilostazol, she states it is a platelet creating drug? Please advise the patient as to what she will need to do regarding this med.

## 2011-04-13 ENCOUNTER — Telehealth (INDEPENDENT_AMBULATORY_CARE_PROVIDER_SITE_OTHER): Payer: Self-pay

## 2011-04-13 NOTE — Telephone Encounter (Signed)
Morrie Sheldon at Dr Avbuere's office called to let Dr Derrell Lolling know it will be ok for pt to d/c cilostazol 5 days pre op and can resume a few days after surgery. Pt aware to take last dose 5 days before surgery.

## 2011-04-22 ENCOUNTER — Inpatient Hospital Stay (HOSPITAL_COMMUNITY)
Admission: RE | Admit: 2011-04-22 | Discharge: 2011-05-02 | DRG: 329 | Disposition: A | Discharge status: Home Health | Payer: PRIVATE HEALTH INSURANCE | Source: Ambulatory Visit | Attending: General Surgery | Admitting: General Surgery

## 2011-04-22 ENCOUNTER — Other Ambulatory Visit (INDEPENDENT_AMBULATORY_CARE_PROVIDER_SITE_OTHER): Payer: Self-pay | Admitting: General Surgery

## 2011-04-22 ENCOUNTER — Inpatient Hospital Stay (HOSPITAL_COMMUNITY): Payer: PRIVATE HEALTH INSURANCE

## 2011-04-22 DIAGNOSIS — D371 Neoplasm of uncertain behavior of stomach: Principal | ICD-10-CM | POA: Diagnosis present

## 2011-04-22 DIAGNOSIS — J189 Pneumonia, unspecified organism: Secondary | ICD-10-CM | POA: Diagnosis not present

## 2011-04-22 DIAGNOSIS — J4489 Other specified chronic obstructive pulmonary disease: Secondary | ICD-10-CM | POA: Diagnosis present

## 2011-04-22 DIAGNOSIS — Z87891 Personal history of nicotine dependence: Secondary | ICD-10-CM

## 2011-04-22 DIAGNOSIS — I70209 Unspecified atherosclerosis of native arteries of extremities, unspecified extremity: Secondary | ICD-10-CM | POA: Diagnosis present

## 2011-04-22 DIAGNOSIS — D375 Neoplasm of uncertain behavior of rectum: Secondary | ICD-10-CM

## 2011-04-22 DIAGNOSIS — I2510 Atherosclerotic heart disease of native coronary artery without angina pectoris: Secondary | ICD-10-CM | POA: Diagnosis present

## 2011-04-22 DIAGNOSIS — E871 Hypo-osmolality and hyponatremia: Secondary | ICD-10-CM | POA: Diagnosis present

## 2011-04-22 DIAGNOSIS — N058 Unspecified nephritic syndrome with other morphologic changes: Secondary | ICD-10-CM | POA: Diagnosis present

## 2011-04-22 DIAGNOSIS — N289 Disorder of kidney and ureter, unspecified: Secondary | ICD-10-CM | POA: Diagnosis present

## 2011-04-22 DIAGNOSIS — N189 Chronic kidney disease, unspecified: Secondary | ICD-10-CM | POA: Diagnosis present

## 2011-04-22 DIAGNOSIS — E1129 Type 2 diabetes mellitus with other diabetic kidney complication: Secondary | ICD-10-CM | POA: Diagnosis present

## 2011-04-22 DIAGNOSIS — J449 Chronic obstructive pulmonary disease, unspecified: Secondary | ICD-10-CM | POA: Diagnosis present

## 2011-04-22 DIAGNOSIS — G4733 Obstructive sleep apnea (adult) (pediatric): Secondary | ICD-10-CM | POA: Diagnosis present

## 2011-04-22 DIAGNOSIS — D378 Neoplasm of uncertain behavior of other specified digestive organs: Secondary | ICD-10-CM

## 2011-04-22 DIAGNOSIS — I5032 Chronic diastolic (congestive) heart failure: Secondary | ICD-10-CM | POA: Diagnosis present

## 2011-04-22 DIAGNOSIS — I2789 Other specified pulmonary heart diseases: Secondary | ICD-10-CM | POA: Diagnosis present

## 2011-04-22 DIAGNOSIS — D72829 Elevated white blood cell count, unspecified: Secondary | ICD-10-CM | POA: Diagnosis present

## 2011-04-22 DIAGNOSIS — E1169 Type 2 diabetes mellitus with other specified complication: Secondary | ICD-10-CM | POA: Diagnosis present

## 2011-04-22 DIAGNOSIS — I129 Hypertensive chronic kidney disease with stage 1 through stage 4 chronic kidney disease, or unspecified chronic kidney disease: Secondary | ICD-10-CM | POA: Diagnosis present

## 2011-04-22 DIAGNOSIS — F1020 Alcohol dependence, uncomplicated: Secondary | ICD-10-CM | POA: Diagnosis present

## 2011-04-22 DIAGNOSIS — K7030 Alcoholic cirrhosis of liver without ascites: Secondary | ICD-10-CM | POA: Diagnosis present

## 2011-04-22 DIAGNOSIS — D62 Acute posthemorrhagic anemia: Secondary | ICD-10-CM | POA: Diagnosis not present

## 2011-04-22 DIAGNOSIS — I509 Heart failure, unspecified: Secondary | ICD-10-CM | POA: Diagnosis present

## 2011-04-22 DIAGNOSIS — J9819 Other pulmonary collapse: Secondary | ICD-10-CM | POA: Diagnosis not present

## 2011-04-22 HISTORY — PX: LAPAROSCOPIC ASSISSTED TOTAL COLECTOMY W/ J-POUCH: SHX1918

## 2011-04-22 LAB — GLUCOSE, CAPILLARY: Glucose-Capillary: 92 mg/dL (ref 70–99)

## 2011-04-22 NOTE — Op Note (Signed)
[name], [name]               ACCOUNT NO.:  [id number]  MEDICAL RECORD NO.:  [id number]  LOCATION:  2316                         FACILITY:  [facility]  PHYSICIAN:  [name]. Derrell Lolling, M.D.DATE OF BIRTH:  1946-06-08  DATE OF PROCEDURE:  04/22/2011 DATE OF DISCHARGE:                              OPERATIVE REPORT   PREOPERATIVE DIAGNOSIS:  Villous adenoma, ascending colon.  POSTOPERATIVE DIAGNOSIS:  Villous adenoma, ascending colon.  OPERATION PERFORMED:  Laparoscopic-assisted right colectomy.  SURGEON:  [name]. Derrell Lolling, MD  FIRST ASSISTANT:  Ardeth Sportsman, MD  OPERATIVE INDICATIONS:  This is a 65 year old African American female with a history of congestive heart failure, peripheral vascular disease, sleep apnea, chronic obstructive pulmonary artery disease, cirrhosis, hypertension, non-insulin-dependent diabetes mellitus.  She has had colonoscopies and removal of villous adenomas in the past.  Recent colonoscopy showed a small sessile polyps, which were removed and there was one 2-cm tubular adenoma that was flat in the mid ascending colon that was not resectable.  This was biopsied and tattooed by Dr. Bosie Clos.  The patient was referred for resection of this area thinking that this polyp was a premalignant condition.  This was discussed with the patient and her family.  Pros and cons of the surgery were discussed.  She decided to go ahead and have this removed.  She was brought to the operating room electively.  She states that she took her bowel prep at home.  OPERATIVE FINDINGS:  The colon was full of liquid stool.  There was no solid stool that I could see.  I could see the tattoo in the mid ascending colon and we could palpate a small polyp in that area.  Once we had removed the specimen, we saw two side-by-side polyps that probably accounted for the colonoscopic findings.  They were right in the dead center of the tattoo.  I probably had 4 or 5 cm distal margin from  this.  The patient's liver looked a little bit discolored, but was not nodular and did not have obvious cirrhosis.  There was no ascites. She had a lot of omental adhesions in the midline.  She had a small hernia in the upper midline, which we repaired on the way out.  OPERATIVE TECHNIQUE:  Following the induction of general endotracheal anesthesia, the patient's abdomen was prepped and draped in a sterile fashion.  A Foley catheter was inserted prior to the prepping and draping.  Surgical time-out was held identifying correct patient, correct procedure, and correct site.  Intravenous antibiotics were given.  An 11-mm Hasson trocar was placed in the midline just above the umbilicus.  There were a lot of adhesions there.  Pneumoperitoneum was created.  We could not see very well through this area, so we put a 5-mm trocar in the left upper quadrant and then put a 5-mm trocar in the left midabdomen, 5-mm trocar in the lower midline and a 5-mm trocar in the right upper quadrant.  We could then see quite well.  We spent some time for probably 15 minutes taking down all of the omental adhesions from the midline.  We then repositioned the patient and ran the small bowel and identify  the cecum.  There were some chronic adhesions in the right lower quadrant and we had to spend some time freeing up the small bowel. We had some small serosal injuries to the terminal ileum, but this was in the last 6 inches and so they were resected with the specimen.  We had no full-thickness injury.  We then mobilized the right colon and hepatic flexure by dividing their lateral peritoneal attachments and slowly mobilized them up off the retroperitoneum.  We then lifted up the omentum off the transverse colon and divided that away from the colon and its mesentery.  We did this in steps fully mobilizing the right transverse colon and the hepatic flexure.  We slowly mobilize this down away from the duodenal bulb  and then off of the second portion of the duodenum.  Everything looked good.  We had excellent mobility at this point and no bleeding.  The liver and gallbladder looked like there was no injury.  The liver was of normal size, certainly not enlarged.  There were no nodules on the liver.  It looked a little bit heavy and little bit discolored.  We released the pneumoperitoneum.  We made about a 7-cm incision in the midline from above the umbilicus and superiorly to the upper midline, 5- mm trocar.  The fascia was very thin and was divided in the midline.  A self-retaining retractor was placed.  The terminal ileum and right colon and hepatic flexure were easily delivered through the wound.  We could palpate the small polyp in the mid ascending colon right at the area where the tattoo was.  The terminal ileum was transected with a GIA stapling device about 10 inches proximal to the ileocecal valve.  The transverse colon was divided with a GIA stapling device.  We could see the middle colic artery and we preserved the left branch of the middle colic artery, but had to sacrifice the right branch to get a decent margin away from the tattooed area, which actually was probably closer to the hepatic flexure.  Mesenteric vessels were skeletonized.  Larger vessels were clamped, divided, and ligated with 2-0 silk ties.  Smaller ones were divided with the LigaSure device.  The specimen was removed and taken to the back table.  I opened it up completely.  I marked the distal margin with a silk suture.  I could see the polyp about 5 cm proximal to the distal margin.  The specimen was sent for routine histology.  I changed my gloves and gown.  Anastomosis was created between the terminal ileum and the transverse colon with a GIA stapling device.  The defect in the bowel wall was closed with a TA-60 stapling device.  The bowel looked pink and healthy on both sides.  There was no tension on the  anastomosis. We reinforced the staple line in several critical areras. We then changed our gloves and instruments.  The mesentery was closed with interrupted sutures of 3-0 silk and 2-0 silk.  There did not seem to be any bleeding.  We irrigated out the abdomen with about 3 liters of saline and evacuated all of the irrigation fluid.  We then closed the midline fascia with a running suture of #1 double-stranded PDS and several interrupted #1 Novafils. We then reestablished the pneumoperitoneum and reinserted the camera. There was some irrigation fluid in the pelvis and some irrigation fluid up over the liver.  Once that was removed, there did not seem to be any bleeding  whatsoever.  We removed the trocars.  The pneumoperitoneum was released.  All of the skin incisions were irrigated with saline and then closed with skin staples.  Clean bandages were placed and the patient was taken to the recovery room in stable condition.  Estimated blood loss was about 200 mL or less.  Sponge, needle, and instrument counts were correct.     [name]. Derrell Lolling, M.D.     HMI/MEDQ  D:  04/22/2011  T:  04/22/2011  Job:  [id number]  cc:   Shirley Friar, MD Fleet Contras, M.D. Nanetta Batty, M.D.  Electronically Signed by Claud Kelp M.D. on 04/22/2011 02:59:02 PM

## 2011-04-23 LAB — COMPREHENSIVE METABOLIC PANEL
ALT: 11 U/L (ref 0–35)
AST: 18 U/L (ref 0–37)
Albumin: 2.6 g/dL — ABNORMAL LOW (ref 3.5–5.2)
Alkaline Phosphatase: 65 U/L (ref 39–117)
Chloride: 102 mEq/L (ref 96–112)
Creatinine, Ser: 0.93 mg/dL (ref 0.50–1.10)
Potassium: 4.3 mEq/L (ref 3.5–5.1)
Sodium: 134 mEq/L — ABNORMAL LOW (ref 135–145)
Total Bilirubin: 0.3 mg/dL (ref 0.3–1.2)

## 2011-04-23 LAB — GLUCOSE, CAPILLARY
Glucose-Capillary: 164 mg/dL — ABNORMAL HIGH (ref 70–99)
Glucose-Capillary: 180 mg/dL — ABNORMAL HIGH (ref 70–99)

## 2011-04-23 LAB — CBC
HCT: 26.7 % — ABNORMAL LOW (ref 36.0–46.0)
Hemoglobin: 8.3 g/dL — ABNORMAL LOW (ref 12.0–15.0)
RBC: 3.43 MIL/uL — ABNORMAL LOW (ref 3.87–5.11)
WBC: 15.1 10*3/uL — ABNORMAL HIGH (ref 4.0–10.5)

## 2011-04-23 LAB — PROTIME-INR
INR: 1.2 (ref 0.00–1.49)
Prothrombin Time: 15.5 seconds — ABNORMAL HIGH (ref 11.6–15.2)

## 2011-04-24 ENCOUNTER — Inpatient Hospital Stay (HOSPITAL_COMMUNITY): Payer: PRIVATE HEALTH INSURANCE

## 2011-04-24 LAB — GLUCOSE, CAPILLARY: Glucose-Capillary: 60 mg/dL — ABNORMAL LOW (ref 70–99)

## 2011-04-24 LAB — CBC
HCT: 27.1 % — ABNORMAL LOW (ref 36.0–46.0)
MCHC: 29.9 g/dL — ABNORMAL LOW (ref 30.0–36.0)
RDW: 15.1 % (ref 11.5–15.5)

## 2011-04-24 LAB — BASIC METABOLIC PANEL
BUN: 8 mg/dL (ref 6–23)
Creatinine, Ser: 0.89 mg/dL (ref 0.50–1.10)
GFR calc Af Amer: >60 mL/min (ref >60)
GFR calc non Af Amer: >60 mL/min (ref >60)
Potassium: 4.6 mEq/L (ref 3.5–5.1)

## 2011-04-25 LAB — GLUCOSE, CAPILLARY
Glucose-Capillary: 66 mg/dL — ABNORMAL LOW (ref 70–99)
Glucose-Capillary: 73 mg/dL (ref 70–99)
Glucose-Capillary: 150 mg/dL — ABNORMAL HIGH (ref 70–99)
Glucose-Capillary: 57 mg/dL — ABNORMAL LOW (ref 70–99)
Glucose-Capillary: 57 mg/dL — ABNORMAL LOW (ref 70–99)
Glucose-Capillary: 81 mg/dL (ref 70–99)
Glucose-Capillary: 137 mg/dL — ABNORMAL HIGH (ref 70–99)

## 2011-04-25 LAB — CBC
HCT: 25.9 % — ABNORMAL LOW (ref 36.0–46.0)
Hemoglobin: 8 g/dL — ABNORMAL LOW (ref 12.0–15.0)
MCH: 24.2 pg — ABNORMAL LOW (ref 26.0–34.0)
MCHC: 30.9 g/dL (ref 30.0–36.0)
MCV: 78.2 fL (ref 78.0–100.0)

## 2011-04-25 LAB — BASIC METABOLIC PANEL
BUN: 11 mg/dL (ref 6–23)
Calcium: 8.8 mg/dL (ref 8.4–10.5)
GFR calc non Af Amer: 49 mL/min — ABNORMAL LOW (ref >60)
Glucose, Bld: 150 mg/dL — ABNORMAL HIGH (ref 70–99)

## 2011-04-26 ENCOUNTER — Inpatient Hospital Stay (HOSPITAL_COMMUNITY): Payer: PRIVATE HEALTH INSURANCE

## 2011-04-26 DIAGNOSIS — E119 Type 2 diabetes mellitus without complications: Secondary | ICD-10-CM

## 2011-04-26 LAB — CROSSMATCH
ABO/RH(D): O POS
Antibody Screen: NEGATIVE
Donor AG Type: NEGATIVE
Donor AG Type: NEGATIVE
Unit division: 0
Unit division: 0
Unit division: 0
Unit division: 0

## 2011-04-26 LAB — SODIUM, URINE, RANDOM: Sodium, Ur: 21 mEq/L

## 2011-04-26 LAB — GLUCOSE, CAPILLARY
Glucose-Capillary: 100 mg/dL — ABNORMAL HIGH (ref 70–99)
Glucose-Capillary: 190 mg/dL — ABNORMAL HIGH (ref 70–99)
Glucose-Capillary: 194 mg/dL — ABNORMAL HIGH (ref 70–99)
Glucose-Capillary: 84 mg/dL (ref 70–99)

## 2011-04-27 LAB — BASIC METABOLIC PANEL
BUN: 15 mg/dL (ref 6–23)
CO2: 25 mEq/L (ref 19–32)
Chloride: 101 mEq/L (ref 96–112)
Creatinine, Ser: 1.55 mg/dL — ABNORMAL HIGH (ref 0.50–1.10)
Potassium: 4.7 mEq/L (ref 3.5–5.1)

## 2011-04-27 LAB — OCCULT BLOOD X 1 CARD TO LAB, STOOL
Fecal Occult Bld: NEGATIVE
Fecal Occult Bld: NEGATIVE

## 2011-04-27 LAB — CBC
HCT: 22.2 % — ABNORMAL LOW (ref 36.0–46.0)
Hemoglobin: 6.8 g/dL — CL (ref 12.0–15.0)
MCV: 78.4 fL (ref 78.0–100.0)
RBC: 2.83 MIL/uL — ABNORMAL LOW (ref 3.87–5.11)
WBC: 12.2 10*3/uL — ABNORMAL HIGH (ref 4.0–10.5)

## 2011-04-27 LAB — GLUCOSE, CAPILLARY
Glucose-Capillary: 204 mg/dL — ABNORMAL HIGH (ref 70–99)
Glucose-Capillary: 131 mg/dL — ABNORMAL HIGH (ref 70–99)
Glucose-Capillary: 160 mg/dL — ABNORMAL HIGH (ref 70–99)

## 2011-04-27 LAB — HEMOGLOBIN AND HEMATOCRIT, BLOOD
HCT: 22.1 % — ABNORMAL LOW (ref 36.0–46.0)
Hemoglobin: 6.8 g/dL — CL (ref 12.0–15.0)

## 2011-04-27 LAB — PROTIME-INR: Prothrombin Time: 14.8 seconds (ref 11.6–15.2)

## 2011-04-28 ENCOUNTER — Inpatient Hospital Stay (HOSPITAL_COMMUNITY): Payer: PRIVATE HEALTH INSURANCE

## 2011-04-28 LAB — CROSSMATCH
ABO/RH(D): O POS
Donor AG Type: NEGATIVE
Donor AG Type: NEGATIVE

## 2011-04-28 LAB — CBC
HCT: 28.7 % — ABNORMAL LOW (ref 36.0–46.0)
Hemoglobin: 9.2 g/dL — ABNORMAL LOW (ref 12.0–15.0)
MCH: 25.6 pg — ABNORMAL LOW (ref 26.0–34.0)
MCHC: 32.1 g/dL (ref 30.0–36.0)
MCV: 79.9 fL (ref 78.0–100.0)
RBC: 3.59 MIL/uL — ABNORMAL LOW (ref 3.87–5.11)

## 2011-04-28 LAB — BASIC METABOLIC PANEL
BUN: 13 mg/dL (ref 6–23)
CO2: 24 mEq/L (ref 19–32)
Calcium: 9 mg/dL (ref 8.4–10.5)
Creatinine, Ser: 1.2 mg/dL — ABNORMAL HIGH (ref 0.50–1.10)
GFR calc non Af Amer: 45 mL/min — ABNORMAL LOW (ref >60)
Glucose, Bld: 114 mg/dL — ABNORMAL HIGH (ref 70–99)
Sodium: 134 mEq/L — ABNORMAL LOW (ref 135–145)

## 2011-04-28 LAB — GLUCOSE, CAPILLARY
Glucose-Capillary: 94 mg/dL (ref 70–99)
Glucose-Capillary: 141 mg/dL — ABNORMAL HIGH (ref 70–99)

## 2011-04-28 LAB — OCCULT BLOOD X 1 CARD TO LAB, STOOL: Fecal Occult Bld: POSITIVE

## 2011-04-29 ENCOUNTER — Inpatient Hospital Stay (HOSPITAL_COMMUNITY): Payer: PRIVATE HEALTH INSURANCE

## 2011-04-29 LAB — GLUCOSE, CAPILLARY: Glucose-Capillary: 170 mg/dL — ABNORMAL HIGH (ref 70–99)

## 2011-04-29 LAB — BASIC METABOLIC PANEL
BUN: 14 mg/dL (ref 6–23)
CO2: 25 mEq/L (ref 19–32)
Chloride: 95 mEq/L — ABNORMAL LOW (ref 96–112)
GFR calc non Af Amer: 48 mL/min — ABNORMAL LOW (ref >60)
Glucose, Bld: 158 mg/dL — ABNORMAL HIGH (ref 70–99)
Potassium: 4.2 mEq/L (ref 3.5–5.1)
Sodium: 129 mEq/L — ABNORMAL LOW (ref 135–145)

## 2011-04-29 LAB — CBC
HCT: 29.1 % — ABNORMAL LOW (ref 36.0–46.0)
Hemoglobin: 9.4 g/dL — ABNORMAL LOW (ref 12.0–15.0)
RBC: 3.64 MIL/uL — ABNORMAL LOW (ref 3.87–5.11)

## 2011-04-30 ENCOUNTER — Inpatient Hospital Stay (HOSPITAL_COMMUNITY): Payer: PRIVATE HEALTH INSURANCE

## 2011-04-30 DIAGNOSIS — M7989 Other specified soft tissue disorders: Secondary | ICD-10-CM

## 2011-04-30 LAB — GLUCOSE, CAPILLARY
Glucose-Capillary: 136 mg/dL — ABNORMAL HIGH (ref 70–99)
Glucose-Capillary: 172 mg/dL — ABNORMAL HIGH (ref 70–99)
Glucose-Capillary: 237 mg/dL — ABNORMAL HIGH (ref 70–99)

## 2011-04-30 LAB — OCCULT BLOOD X 1 CARD TO LAB, STOOL: Fecal Occult Bld: NEGATIVE

## 2011-05-01 ENCOUNTER — Inpatient Hospital Stay (HOSPITAL_COMMUNITY): Payer: PRIVATE HEALTH INSURANCE

## 2011-05-01 LAB — BASIC METABOLIC PANEL
CO2: 23 mEq/L (ref 19–32)
Chloride: 103 mEq/L (ref 96–112)
Creatinine, Ser: 1.82 mg/dL — ABNORMAL HIGH (ref 0.50–1.10)
Glucose, Bld: 246 mg/dL — ABNORMAL HIGH (ref 70–99)

## 2011-05-01 LAB — CBC
Hemoglobin: 8.9 g/dL — ABNORMAL LOW (ref 12.0–15.0)
MCV: 79.8 fL (ref 78.0–100.0)
Platelets: 351 10*3/uL (ref 150–400)
RBC: 3.47 MIL/uL — ABNORMAL LOW (ref 3.87–5.11)
WBC: 13.7 10*3/uL — ABNORMAL HIGH (ref 4.0–10.5)

## 2011-05-01 LAB — GLUCOSE, CAPILLARY
Glucose-Capillary: 246 mg/dL — ABNORMAL HIGH (ref 70–99)
Glucose-Capillary: 255 mg/dL — ABNORMAL HIGH (ref 70–99)

## 2011-05-01 LAB — SEDIMENTATION RATE: Sed Rate: 124 mm/hr — ABNORMAL HIGH (ref 0–22)

## 2011-05-01 LAB — URIC ACID: Uric Acid, Serum: 8.8 mg/dL — ABNORMAL HIGH (ref 2.4–7.0)

## 2011-05-02 LAB — CBC
MCH: 25.5 pg — ABNORMAL LOW (ref 26.0–34.0)
MCHC: 32.3 g/dL (ref 30.0–36.0)
Platelets: 391 10*3/uL (ref 150–400)
RDW: 16.3 % — ABNORMAL HIGH (ref 11.5–15.5)

## 2011-05-02 LAB — BASIC METABOLIC PANEL
Calcium: 9 mg/dL (ref 8.4–10.5)
GFR calc non Af Amer: 32 mL/min — ABNORMAL LOW (ref >60)
Sodium: 137 mEq/L (ref 135–145)

## 2011-05-02 LAB — GLUCOSE, CAPILLARY: Glucose-Capillary: 143 mg/dL — ABNORMAL HIGH (ref 70–99)

## 2011-05-02 NOTE — Consult Note (Signed)
[name], KANDLER NO.:  [id number]  MEDICAL RECORD NO.:  [id number]  LOCATION:  5148                         FACILITY:  [facility]  PHYSICIAN:  Thana Farr, MD    DATE OF BIRTH:  05-May-1946  DATE OF CONSULTATION:  04/29/2011 DATE OF DISCHARGE:                                CONSULTATION   REQUESTING PHYSICIAN:  [name]. Derrell Lolling, MD  HISTORY:  [name] is a 65 year old female 7 days postop abdominal procedure who reported that on yesterday she was sitting and watching TV and had acute onset of pain and numbness in her right upper extremity. Due to the pain, she is unable to move the arm.  She reports that she had one such episode many years ago.  Her mother gave her some aspirin and when she awakened the next day, her symptoms had resolved.  She has had a workup by CT of the head and of the cervical spine.  CT of the head showed some small vessel ischemic changes.  CT of the cervical spine showed multilevel degenerative changes.  PAST MEDICAL HISTORY: 1. Diabetes. 2. COPD. 3. Pulmonary hypertension. 4. Liver cirrhosis. 5. Peripheral vascular disease. 6. Sleep apnea.  SOCIAL HISTORY:  The patient quit smoking in 2005.  She lives alone. There is a history of alcohol abuse.  The patient is no longer drinking. There is no history of illicit drug abuse.  MEDICATIONS:  Insulin, Levaquin, metoprolol, Singulair, Protonix, vancomycin, albuterol, Zofran, Roxicodone.  PHYSICAL EXAMINATION:  VITAL SIGNS:  Blood pressure 139/72, heart rate 90, respiratory rate 20, T-max 100.6. MENTAL STATUS TESTING:  The patient is alert and oriented.  She can follow commands without difficulty.  Speech is fluent.  On cranial nerve testing, II visual fields grossly intact.  III, IV, and VI extraocular movements intact.  V and VII smile symmetric.  VIII grossly intact.  IX and X positive gag.  XI bilateral shoulder shrug.  XII midline tongue extension.  On motor exam, the  patient is unable to move the right upper extremity secondary to extreme pain.  She does show some movement though in all muscle groups.  She is 5/5 otherwise in the left lower extremity. On sensory testing, the patient has pain even significantly to light touch out to the right upper extremity and into all the fingers.  This includes the top of the shoulder as well.  It does not extend into the trunk but does extend into the periscapular region on the right. Sensation is otherwise intact.  Deep tendon reflexes are 2+ in the upper extremities.  They are absent in lower extremities.  Plantars are mute. Cerebellar testing not performed.  LABORATORY DATA:  Sodium of 134, potassium 4.4, chloride 102, bicarb 24, BUN 13, creatinine 1.2, glucose 114, calcium 9.0.  White blood cell count 13.6, platelet count 309, hemoglobin/hematocrit 9.2 and 28.7 respectively.  PT/INR, PTT 14.8, 1.14 and 34 respectively.  ASSESSMENT:  [name] is a 65 year old female with significant right upper extremity pain.  The differential includes a plexopathy related to diabetes versus a nonvesicular herpetic pain syndrome versus cervical radiculopathy.  CT is not sufficient to rule out the possibility of cervical radiculopathy  causing the patient's current symptoms.  Further imaging would be necessary.  In the meantime, we will attempt treatment of pain with nonnarcotic methods.  Did not expect this type pain to respond well to narcotic intervention.  PLAN: 1. MRI of the cervical spine if possible. 2. Tegretol 100 mg b.i.d., first dose tonight.  May need further     increase in her Tegretol. 3. If MRI of cervical spine unable to be performed and Tegretol is not effective would consider trial of steroids.          ______________________________ Thana Farr, MD     LR/MEDQ  D:  04/29/2011  T:  04/30/2011  Job:  [id number]  Electronically Signed by Thana Farr MD on 05/02/2011 06:26:09 AM

## 2011-05-10 DIAGNOSIS — D374 Neoplasm of uncertain behavior of colon: Secondary | ICD-10-CM

## 2011-05-10 HISTORY — DX: Neoplasm of uncertain behavior of colon: D37.4

## 2011-05-11 ENCOUNTER — Inpatient Hospital Stay (HOSPITAL_COMMUNITY)
Admission: EM | Admit: 2011-05-11 | Discharge: 2011-05-13 | DRG: 292 | Disposition: A | Discharge status: Home / Self Care | Payer: PRIVATE HEALTH INSURANCE | Attending: Internal Medicine | Admitting: Internal Medicine

## 2011-05-11 ENCOUNTER — Emergency Department (HOSPITAL_COMMUNITY): Payer: PRIVATE HEALTH INSURANCE

## 2011-05-11 ENCOUNTER — Encounter (INDEPENDENT_AMBULATORY_CARE_PROVIDER_SITE_OTHER): Payer: PRIVATE HEALTH INSURANCE | Admitting: General Surgery

## 2011-05-11 ENCOUNTER — Telehealth (INDEPENDENT_AMBULATORY_CARE_PROVIDER_SITE_OTHER): Payer: Self-pay

## 2011-05-11 DIAGNOSIS — E876 Hypokalemia: Secondary | ICD-10-CM | POA: Diagnosis present

## 2011-05-11 DIAGNOSIS — I2789 Other specified pulmonary heart diseases: Secondary | ICD-10-CM | POA: Diagnosis present

## 2011-05-11 DIAGNOSIS — J449 Chronic obstructive pulmonary disease, unspecified: Secondary | ICD-10-CM | POA: Diagnosis present

## 2011-05-11 DIAGNOSIS — N183 Chronic kidney disease, stage 3 unspecified: Secondary | ICD-10-CM | POA: Diagnosis present

## 2011-05-11 DIAGNOSIS — I739 Peripheral vascular disease, unspecified: Secondary | ICD-10-CM | POA: Diagnosis present

## 2011-05-11 DIAGNOSIS — I129 Hypertensive chronic kidney disease with stage 1 through stage 4 chronic kidney disease, or unspecified chronic kidney disease: Secondary | ICD-10-CM | POA: Diagnosis present

## 2011-05-11 DIAGNOSIS — I509 Heart failure, unspecified: Secondary | ICD-10-CM | POA: Diagnosis present

## 2011-05-11 DIAGNOSIS — D72829 Elevated white blood cell count, unspecified: Secondary | ICD-10-CM | POA: Diagnosis present

## 2011-05-11 DIAGNOSIS — I5033 Acute on chronic diastolic (congestive) heart failure: Principal | ICD-10-CM | POA: Diagnosis present

## 2011-05-11 DIAGNOSIS — K5730 Diverticulosis of large intestine without perforation or abscess without bleeding: Secondary | ICD-10-CM | POA: Diagnosis present

## 2011-05-11 DIAGNOSIS — Z9049 Acquired absence of other specified parts of digestive tract: Secondary | ICD-10-CM

## 2011-05-11 DIAGNOSIS — D371 Neoplasm of uncertain behavior of stomach: Secondary | ICD-10-CM | POA: Diagnosis present

## 2011-05-11 DIAGNOSIS — K7030 Alcoholic cirrhosis of liver without ascites: Secondary | ICD-10-CM | POA: Diagnosis present

## 2011-05-11 DIAGNOSIS — K861 Other chronic pancreatitis: Secondary | ICD-10-CM | POA: Diagnosis present

## 2011-05-11 DIAGNOSIS — E1169 Type 2 diabetes mellitus with other specified complication: Secondary | ICD-10-CM | POA: Diagnosis present

## 2011-05-11 DIAGNOSIS — M1990 Unspecified osteoarthritis, unspecified site: Secondary | ICD-10-CM | POA: Diagnosis present

## 2011-05-11 DIAGNOSIS — J4489 Other specified chronic obstructive pulmonary disease: Secondary | ICD-10-CM | POA: Diagnosis present

## 2011-05-11 DIAGNOSIS — F1020 Alcohol dependence, uncomplicated: Secondary | ICD-10-CM | POA: Diagnosis present

## 2011-05-11 LAB — CBC
MCH: 25.3 pg — ABNORMAL LOW (ref 26.0–34.0)
Platelets: 427 10*3/uL — ABNORMAL HIGH (ref 150–400)
RBC: 4.42 MIL/uL (ref 3.87–5.11)
WBC: 17.4 10*3/uL — ABNORMAL HIGH (ref 4.0–10.5)

## 2011-05-11 LAB — URINALYSIS, ROUTINE W REFLEX MICROSCOPIC
Bilirubin Urine: NEGATIVE
Hgb urine dipstick: NEGATIVE
Ketones, ur: NEGATIVE mg/dL
Nitrite: NEGATIVE
pH: 7 (ref 5.0–8.0)

## 2011-05-11 LAB — BASIC METABOLIC PANEL
BUN: 10 mg/dL (ref 6–23)
Calcium: 9.2 mg/dL (ref 8.4–10.5)
GFR calc Af Amer: 37 mL/min — ABNORMAL LOW (ref >60)
GFR calc non Af Amer: 30 mL/min — ABNORMAL LOW (ref >60)
Potassium: 4.5 mEq/L (ref 3.5–5.1)
Sodium: 143 mEq/L (ref 135–145)

## 2011-05-11 LAB — POCT I-STAT TROPONIN I: Troponin i, poc: 0.03 ng/mL (ref 0.00–0.08)

## 2011-05-11 LAB — GLUCOSE, CAPILLARY: Glucose-Capillary: 176 mg/dL — ABNORMAL HIGH (ref 70–99)

## 2011-05-11 NOTE — H&P (Signed)
NAMESADEE, OSLAND NO.:  [id number]  MEDICAL RECORD NO.:  [id number]  LOCATION:  WLED                         FACILITY:  [facility]  PHYSICIAN:  Marinda Elk, M.D.DATE OF BIRTH:  28-Apr-1946  DATE OF ADMISSION:  05/11/2011 DATE OF DISCHARGE:                             HISTORY & PHYSICAL   PRIMARY CARE DOCTOR:  Fleet Contras, MD  SURGEON:  [name]. Derrell Lolling, MD  HISTORY OF PRESENT ILLNESS:  This is a 65 year old with past medical history of liver cirrhosis, also chronic diastolic heart failure with an EF of 65% to 70%, recently in the hospital for laparoscopic right colectomy, who comes in today for dyspnea.  The patient relates that 1 day prior to admission, started getting short of breath.  She could not walk to the bathroom.  The night before admission, she was not able to lie flat to sleep.  She relates this was not happening a month ago as she was able to walk without getting short of breath.  She relates no pain.  No chest pain, no abdominal pain.  She has recently noticed swelling of the feet.  Has not had any fevers, sick contact, cough, chills, nausea, or vomiting.  She is still passing stools and has had a passing gas.  ALLERGIES:  PENICILLIN AND ACETAMINOPHEN.  PAST MEDICAL HISTORY: 1. Chronic diastolic heart failure with an EF of 65%. 2. Mild pulmonary hypertension. 3. COPD. 4. History of liver cirrhosis secondary to alcohol abuse. 5. Peripheral vascular disease. 6. Chronic renal failure, stage 3. 7. Chronic pancreatitis. 8. Grade 4 esophagitis with no varices. 9. DJD.  MEDICATIONS:  Pending at the time of this dictation.  SOCIAL HISTORY:  She quit drinking about 10 years ago.  She quit smoking about 3 years ago, she has a 50-year pack.  Denies any drugs.  Lives alone here in [place].  FAMILY HISTORY:  Significant for diabetes in her mother's side.  PHYSICAL EXAMINATION:  VITAL SIGNS:  Temperature 98, blood pressure 167/87.   She was saturating 94% on 3 L, breathing 20 times per minute. Her heart rate is 95. GENERAL:  She is awake, alert, and oriented x3.  Coherent and fluent language. HEENT:  Moist mucous membrane.  Atraumatic, normocephalic.  Anicteric. No pallor. CARDIOVASCULAR:  She has a regular rate and rhythm with a positive S1 and S2, and she also has an S3.  She has a 9-cm JVD.  Positive moderate jugular reflux. LUNGS:  Good air movement and crackles at bases. ABDOMEN:  Positive bowel sounds, distended with a midline scar and staples.  These showed no signs of infection.  It is not tender to palpation.  No rebound or guarding. EXTREMITIES:  She had 3+ edema.  No clubbing. SKIN:  No rashes or ulceration. NEUROLOGIC:  Nonfocal.  LABS ON ADMISSION:  Show a proBNP of 13,000.  Sodium of 43, potassium 4.5, chloride 106, bicarbonate of 28, glucose of 111, BUN of 10, creatinine 1.6, calcium 9.2.  Her first set of cardiac enzymes is negative x1.  Her white count is 17.4, hemoglobin 11.2, platelet count of 427.  Chest x-ray shows just low lung volumes with small pleural effusions which is unchanged  from previous.  ASSESSMENT/PLAN: 1. Acute decompensated diastolic heart failure.  We will start her on     Lasix.  She will repeat a 2D echo.  We will start her on ACE as she     does not seem to be on one at home.  We will cycle her cardiac     enzymes.  Check her urinary sodium to see if she is perfusing her     kidneys real well.  At this time, I do not think she is having     ischemic changes.  We will monitor strict I's and O's.  We will     admit her to telemetry unit. Continnue betablockers 2. Leukocytosis, probably stress demargination.  Concerning 17 white     blood cells is kind of high.  We will check a UA.  We will also     check a urine culture.  We will also get an x-ray of the abdomen.     We will get blood cultures.  Although this could be from shortness     of breath, she is not in that  significant distress.  She did have     surgery about 2 weeks ago.  If the white count is not improved, we     will get a CT scan of the abdomen to rule out any kind of     abscesses.  Also, on the differential, it could be spontaneous     bacterial peritonitis.  So we will do an abdominal ultrasound to     rule out ascites as she does have a significant history of alcohol     abuse.  We will also check a PT and INR. 3. Chronic kidney disease, currently at baseline.  We will continue to     monitor. 4. Diabetes type 2.  We will start her on sliding scale insulin.  No     Lantus at this time.     Marinda Elk, M.D.     AF/MEDQ  D:  05/11/2011  T:  05/11/2011  Job:  [id number]  cc:   Fleet Contras, M.D. Fax: [phone]  Electronically Signed by Marinda Elk M.D. on 05/11/2011 05:18:20 PM

## 2011-05-11 NOTE — Telephone Encounter (Signed)
Frankie from PT called to advise that pt has declined occupational PT. We can call her at [phone] if we have any questions.

## 2011-05-12 ENCOUNTER — Telehealth (INDEPENDENT_AMBULATORY_CARE_PROVIDER_SITE_OTHER): Payer: Self-pay

## 2011-05-12 LAB — URINE CULTURE

## 2011-05-12 LAB — GLUCOSE, CAPILLARY
Glucose-Capillary: 119 mg/dL — ABNORMAL HIGH (ref 70–99)
Glucose-Capillary: 125 mg/dL — ABNORMAL HIGH (ref 70–99)
Glucose-Capillary: 181 mg/dL — ABNORMAL HIGH (ref 70–99)
Glucose-Capillary: 219 mg/dL — ABNORMAL HIGH (ref 70–99)

## 2011-05-12 NOTE — Telephone Encounter (Signed)
Per Marcelino Duster Dr Magdalen Spatz called to advise [place] that pt has been admitted to [facility] room 1426. Pt is requesting that Dr Derrell Lolling come by hospital to remove staples. Dr Derrell Lolling advised and pt info placed on hospital list.

## 2011-05-13 LAB — COMPREHENSIVE METABOLIC PANEL
ALT: 16 U/L (ref 0–35)
AST: 15 U/L (ref 0–37)
Calcium: 9.4 mg/dL (ref 8.4–10.5)
Sodium: 139 mEq/L (ref 135–145)
Total Protein: 6.4 g/dL (ref 6.0–8.3)

## 2011-05-13 LAB — GLUCOSE, CAPILLARY: Glucose-Capillary: 167 mg/dL — ABNORMAL HIGH (ref 70–99)

## 2011-05-13 LAB — CBC
Hemoglobin: 10.3 g/dL — ABNORMAL LOW (ref 12.0–15.0)
MCH: 25.1 pg — ABNORMAL LOW (ref 26.0–34.0)
MCV: 82.2 fL (ref 78.0–100.0)
RBC: 4.11 MIL/uL (ref 3.87–5.11)

## 2011-05-15 NOTE — Discharge Summary (Signed)
[name], [name]               ACCOUNT NO.:  [id number]  MEDICAL RECORD NO.:  [id number]  LOCATION:  5148                         FACILITY:  [facility]  PHYSICIAN:  [name]. Derrell Lolling, M.D.DATE OF BIRTH:  April 02, 1946  DATE OF ADMISSION:  04/22/2011 DATE OF DISCHARGE:  05/02/2011                              DISCHARGE SUMMARY   FINAL DIAGNOSES: 1. Villous adenoma of the ascending colon. 2. Chronic congestive heart failure. 3. Atherosclerotic peripheral vascular disease status post lower     extremity stenting. 4. Sleep apnea. 5. Chronic obstructive pulmonary disease. 6. Hypertension. 7. Non-insulin dependent diabetes mellitus. 8. Cirrhosis, well compensated. 9. History of diverticular bleed. 10.Coronary artery disease. 11.Right upper extremity pain of uncertain etiology, possible     radiculopathy.  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  OPERATIONS PERFORMED:  Laparoscopic-assisted right colectomy date April 22, 2011.HISTORY:  This is a 65 year old African American female who has had a diverticular bleed in the past.  She also had a microperforation of the left colon several years ago requiring hospitalization and antibiotics.  In 2009, she had a colonoscopy for a small tubular adenoma, was removed from the proximal ascending colon.  Recent screening colonoscopy by Dr. Bosie Clos on Jan 11, 2011, showed a 2-cm polypoid lesion in the ascending colon, which was biopsied and tattooed, and this showed villous adenoma. Dr. Bosie Clos stated that he could not remove this colonoscopically and referred the patient for consideration of surgery.  The patient is aware that this is a neoplastic polyp with premalignant potential.  She was counseled as an outpatient.  She underwent a bowel prep at home and was brought to hospital electively.  HOSPITAL COURSE:  On the day of admission, the patient underwent laparoscopic-assisted right colectomy.  The surgery was uneventful.  The liver really  did not look that bad.  Final pathology report showed a villous adenoma, no significant dysplasia, no malignancy.  Postoperatively, the patient recovered from her colon resection slowly, but uneventfully.  Her numerous medical problems were managed.  Diabetes was managed with sliding scale insulin.  COPD was managed with inhalation therapy.  Coronary artery disease was managed by placing her back on beta-blockers.  Hypertension remained controlled. Deconditioning was managed with the use of physical therapy and occupational therapy.  She was on entereg protocol.  She began having bowel movements on the second postop day and began advancing her diet slowly, but steadily thereafter.  She tolerate her diet and had no problems with nausea, vomiting, or wound healing.  On April 26, 2011, I asked the [facility] to see her, and they helped evaluate her COPD, congestive heart failure, diabetes, and also help to manage some hypoglycemic episodes.  On April 27, 2011, she was noted to have a hemoglobin of 6.8.  We discontinued her Pletal and she was transfused.  Elevated creatinine was noted.  This actually came back down to baseline during the hospital stay with proper hydration and IV fluids.  She was about ready to go home on April 28, 2011, but then complained of right upper extremity pain from her neck to her fingers.  CT scan of her brain was negative.  CT scan of the cervical spine showed degenerative  disk disease at multiple levels.  MRI showed central cervical canal stenosis at C4-C6.  It also showed foraminal stenosis at C4-C5, C5-6, and C6-7. She was seen in consultation by Neurology.  They were skeptical that her symptoms were due to her cervical spine findings on x-ray.  She was placed on Tegretol wall and prednisone thinking that this might be a diabetic plexopathy.  The right upper extremity pain resolved on May 01, 2011.  The patient did well and was discharged  home on May 02, 2011.  Her wound was healing uneventfully.  Staples were left in place, and she was to return to see me in the office within the week to examine the wound and remove the staples.  Arrangements were made and advice was made for her to get nerve conduction velocities and EMG as an outpatient.  Home medications include the following: 1. Avelox 400 mg daily. 2. Colchicine 0.6 mg daily. 3. Oxycodone 5 mg IR tabs every 4 hours as needed. 4. Prednisone 20 mg tapering Dosepak over 9 days. 5. Albuterol inhaler 1-2 puffs every 4 hours as needed. 6. Albuterol nebs 1 every 6 hours as needed. 7. Aspirin 81 mg daily. 8. Cilostazol 100 mg 1/2 tablet twice daily. 9. Amaryl 4 mg daily. 10.Januvia 50 mg tablet daily. 11.Lopressor 50 mg twice daily. 12.Omeprazole 40 mg daily. 13.Senna 8.6 mg 2 tablets daily as needed. 14.Singulair 10 mg daily at bedtime. 15.Vitamin D over-the-counter 1 tablet by mouth daily.     [name]. Derrell Lolling, M.D.     HMI/MEDQ  D:  05/14/2011  T:  05/14/2011  Job:  [id number]  cc:   Fleet Contras, M.D. Shirley Friar, MD Nanetta Batty, M.D. Beryle Beams, MD  Electronically Signed by Claud Kelp M.D. on 05/15/2011 10:44:54 AM

## 2011-05-18 LAB — CULTURE, BLOOD (ROUTINE X 2)
Culture  Setup Time: 201209130146
Culture: NO GROWTH

## 2011-05-26 LAB — BASIC METABOLIC PANEL
BUN: 57 — ABNORMAL HIGH
CO2: 20
CO2: 22
Calcium: 8.9
Chloride: 104
Creatinine, Ser: 2.43 — ABNORMAL HIGH
GFR calc Af Amer: 24 — ABNORMAL LOW
Glucose, Bld: 178 — ABNORMAL HIGH
Sodium: 138

## 2011-05-26 LAB — CBC
HCT: 23.1 — ABNORMAL LOW
HCT: 23.6 — ABNORMAL LOW
HCT: 25.5 — ABNORMAL LOW
Hemoglobin: 8 — ABNORMAL LOW
Hemoglobin: 8.1 — ABNORMAL LOW
Hemoglobin: 8.7 — ABNORMAL LOW
Hemoglobin: 8.8 — ABNORMAL LOW
MCHC: 34.1
MCHC: 34.5
MCV: 81.9
MCV: 81.9
MCV: 82
MCV: 82.2
Platelets: 226
RBC: 2.89 — ABNORMAL LOW
RBC: 3.11 — ABNORMAL LOW
RDW: 13
RDW: 13
WBC: 13.3 — ABNORMAL HIGH
WBC: 13.4 — ABNORMAL HIGH

## 2011-05-26 LAB — DIFFERENTIAL
Basophils Relative: 1
Lymphs Abs: 2.2
Monocytes Absolute: 1.6 — ABNORMAL HIGH
Monocytes Relative: 12
Neutro Abs: 9.2 — ABNORMAL HIGH
Neutrophils Relative %: 70

## 2011-05-26 LAB — CROSSMATCH
Antibody Screen: NEGATIVE
Donor AG Type: NEGATIVE

## 2011-05-26 LAB — URINALYSIS, ROUTINE W REFLEX MICROSCOPIC
Bilirubin Urine: NEGATIVE
Ketones, ur: NEGATIVE
Nitrite: NEGATIVE
Urobilinogen, UA: 0.2

## 2011-05-26 LAB — URINE MICROSCOPIC-ADD ON

## 2011-05-26 NOTE — Discharge Summary (Signed)
[name], [name]               ACCOUNT NO.:  [id number]  MEDICAL RECORD NO.:  [id number]  LOCATION:  1426                         FACILITY:  [facility]  PHYSICIAN:  Hillery Aldo, M.D.   DATE OF BIRTH:  12/17/1945  DATE OF ADMISSION:  05/11/2011 DATE OF DISCHARGE:  05/13/2011                              DISCHARGE SUMMARY   PRIMARY CARE PHYSICIAN:  Fleet Contras, M.D.  SURGEON:  [name]. Derrell Lolling, M.D.  DISCHARGE DIAGNOSES: 1. Acute-on-chronic diastolic congestive heart failure. 2. Villous adenoma, status post right colectomy on April 22, 2011. 3. Leukocytosis. 4. History of chronic obstructive pulmonary disease. 5. Type 2 diabetes. 6. Hypoglycemia. 7. Hypertension. 8. Stage 3 chronic kidney disease. 9. History of alcohol-induced cirrhosis of the liver. 10.Mild pulmonary hypertension. 11.History of chronic pancreatitis. 12.Diverticulosis. 13.History of duodenitis. 14.Hypokalemia. 15.Degenerative joint disease.  DISCHARGE MEDICATIONS: 1. Aspirin 81 mg p.o. daily. 2. Potassium chloride 20 mEq p.o. daily for 3 days, then as needed     p.r.n. when taking Lasix. 3. Lasix 40 mg p.o. daily for 3 days, then p.r.n. for shortness of     breath, worsening lower leg edema, or a 3-pound weight gain over 24-     hour period of time. 4. Lisinopril 5 mg p.o. daily. 5. Albuterol inhaler 1 to 2 puffs q.4 hours p.r.n. shortness of     breath. 6. Albuterol and albuterol nebulization one treatment q.6 hours p.r.n.     shortness of breath. 7. Cilostazol 100 mg 1/2 tablet p.o. b.i.d. 8. Fluticasone nasal spray 2 sprays intranasally daily. 9. Amaryl 4 mg p.o. daily. 10.Januvia 50 mg p.o. daily. 11.Lantus 8 units subcutaneously nightly. 12.Lopressor 50 mg p.o. b.i.d. 13.Omeprazole 40 mg p.o. daily. 14.Singulair 10 mg p.o. nightly. 15.Vitamin B1 one tablet p.o. daily.  CONSULTATIONS:  [name]. Derrell Lolling, M.D., of General Surgery.  BRIEF ADMISSION HISTORY OF PRESENT ILLNESS:  The  patient is a 65 year old female, who underwent an elective laparoscopic-assisted right colectomy, performed by Dr. Claud Kelp on April 22, 2011.  During the course of her hospital stay, she was noted to have chronic diastolic heart failure that was relatively compensated.  Nevertheless, she received IV fluids while in the hospital and ultimately presented with dyspnea on May 11, 2011.  She also reported orthopnea, and upon initial evaluation in the emergency department, she had an elevated pro- BNP value and findings on clinical exam consistent with decompensated heart failure.  She subsequently was referred to the hospitalist service for further evaluation and treatment.  For the full details, please see the dictated report done by Dr. Robb Matar.  PROCEDURES AND DIAGNOSTIC STUDIES: 1. Chest x-ray on May 11, 2011, showed low lung volumes,     bibasilar opacities, possibly reflecting a combination of     atelectasis and small pleural effusions, unchanged. 2. Acute abdominal series on May 11, 2011, showed increased     bilateral pleural effusions and bibasilar compressive atelectasis.     Nonobstructive bowel gas pattern with no free air.  Midline     abdominal skin staples and right abdominal suture material,     compatible with recent hemicolectomy. 3. Abdominal ultrasound on May 11, 2011, showed suboptimal exam  as the patient was not n.p.o. and bowel gas artifact limited     evaluation and multiple organs.  No sonographic evidence for     cholecystitis.  Heterogeneous liver echogenicity without focal     lesion identified.  HOSPITAL COURSE BY PROBLEM: 1. Acute-on-chronic diastolic congestive heart failure:  The patient     was admitted and put on IV Lasix therapy.  She diuresed     significantly over the course of her hospital stay and her weight     went from 69.8 kg down to 67.9 kg.  Her pro-BNP went down to 2082     from 12,897.  At this point, we  are transitioning her to p.o.     Lasix, which she will continue to take for the next 3 days and then     as needed for weight gain, lower extremity edema, or shortness of     breath.  Of note, her last echocardiogram was done in January of     this year and therefore not repeated.  At that time, she had an     ejection fraction of 65% to 70% and evidence of diastolic     dysfunction. 2. Villous adenoma, status post right colectomy:  The patient was seen     by Dr. Derrell Lolling for an evaluation of her postoperative wound     progress.  He recommended discontinuation of staples and to follow     up with him 3 to 4 weeks post discharge. 3. Leukocytosis:  This was felt to be reactive secondary to recent     surgery.  White blood cell count decreased from 17.4 to 13.5.     Chest x-ray was negative for any evidence of pneumonia.  Urine     culture was negative.  She did undergo an abdominal ultrasound,     which ruled out cholecystitis and was not indicative of spontaneous     bacterial peritonitis, given that no ascitic material was found.     Additionally, urine cultures were negative. 4. History of COPD:  The patient was provided with nebulized     bronchodilator therapy as needed. 5. Type 2 diabetes/hypoglycemia:  The patient was put on a combination     of Lantus and sliding scale insulin.  She had one low sugar of 67,     but was asymptomatic.  At this point, she can safely resume her     home medications at discharge. 6. Uncontrolled hypertension:  The patient's blood pressure on     admission was 167/87.  With diuresis and adding an ACE inhibitor to     her regimen, her discharge blood pressure is 132/71. 7. Stage 3 chronic kidney disease:  The patient likely has chronic     kidney disease related to diabetic and hypertensive     nephrosclerosis.  Recommend outpatient referral to a nephrologist     to preserve remaining renal function.  The patient's baseline     creatinine is  approximately 1.63.  Discharge creatinine status post     aggressive diuresis is 1.79.  She was started on ACE inhibitor for     nephroprotection. 8. Cirrhosis of the liver secondary to history of alcoholism:  The     patient's cirrhosis appears to be mild and that she does not have     any evidence of decompensated liver failure. 9. Mild pulmonary hypertension:  The patient was provided with     supplemental oxygen as needed.  10.Chronic pancreatitis:  No active issues. 11.History of diverticulosis/duodenitis:  Again, no active issues. 12.Hypokalemia:  The patient was repleted and put on supplementation     therapy to use p.r.n. when she takes Lasix.  DISPOSITION:  The patient is medically stable and discharged home.  CONDITION ON DISCHARGE:  Improved.  DISCHARGE INSTRUCTIONS:  Activity:  Increase activity slowly, may shower/bathe.  Diet:  Low-sodium, heart-healthy, diabetic.  Wound care:  Keep abdominal wound clean and dry.  Leave Steri-Strips intact.  FOLLOWUP:  Dr. Concepcion Elk in 1 to 2 weeks.  Call for an appointment. Follow up with Dr. Derrell Lolling in 3 weeks.  Call for an appointment.  Note:  The patient will receive the influenza vaccine.  Time spent coordinating care for discharge, discharge instructions including face-to-face time, equals approximately 40 minutes.     Hillery Aldo, M.D.     CR/MEDQ  D:  05/13/2011  T:  05/13/2011  Job:  [id number]  cc:   Fleet Contras, M.D. Fax: [phone]  [name]. Derrell Lolling, M.D. [address], [address] [place] [address]  Electronically Signed by Hillery Aldo M.D. on 05/26/2011 05:50:12 PM

## 2011-05-27 LAB — CBC
HCT: 26.3 — ABNORMAL LOW
HCT: 26.6 — ABNORMAL LOW
HCT: 32.2 — ABNORMAL LOW
Hemoglobin: 10.5 — ABNORMAL LOW
Hemoglobin: 8.9 — ABNORMAL LOW
Hemoglobin: 9.3 — ABNORMAL LOW
MCHC: 33.1
MCHC: 33.3
MCHC: 33.3
MCHC: 33.4
MCHC: 33.8
MCHC: 34.1
MCHC: 34.5
MCV: 81.7
MCV: 81.8
MCV: 81.8
MCV: 82.8
Platelets: 252
Platelets: 262
Platelets: 272
Platelets: 274
Platelets: 295
Platelets: 311
RBC: 3.22 — ABNORMAL LOW
RBC: 3.4 — ABNORMAL LOW
RBC: 3.92
RDW: 13.3
RDW: 13.5
RDW: 13.6
RDW: 13.8
RDW: 14
RDW: 14
RDW: 14.2
RDW: 14.3
WBC: 12.6 — ABNORMAL HIGH
WBC: 13.1 — ABNORMAL HIGH
WBC: 14.4 — ABNORMAL HIGH

## 2011-05-27 LAB — BASIC METABOLIC PANEL
BUN: 13
BUN: 34 — ABNORMAL HIGH
CO2: 15 — ABNORMAL LOW
CO2: 17 — ABNORMAL LOW
CO2: 19
Calcium: 8.2 — ABNORMAL LOW
Calcium: 8.4
Calcium: 8.7
Creatinine, Ser: 1.5 — ABNORMAL HIGH
Creatinine, Ser: 1.6 — ABNORMAL HIGH
Creatinine, Ser: 1.75 — ABNORMAL HIGH
GFR calc Af Amer: 36 — ABNORMAL LOW
GFR calc non Af Amer: 29 — ABNORMAL LOW
Glucose, Bld: 106 — ABNORMAL HIGH
Glucose, Bld: 114 — ABNORMAL HIGH
Glucose, Bld: 81
Sodium: 138

## 2011-05-27 LAB — URINE CULTURE: Colony Count: >=100000

## 2011-05-27 LAB — CULTURE, BLOOD (ROUTINE X 2): Culture: NO GROWTH

## 2011-05-27 LAB — URINALYSIS, ROUTINE W REFLEX MICROSCOPIC
Bilirubin Urine: NEGATIVE
Nitrite: NEGATIVE
Protein, ur: 30 — AB
Specific Gravity, Urine: 1.014
Urobilinogen, UA: 1

## 2011-05-27 LAB — URINE MICROSCOPIC-ADD ON

## 2011-05-27 LAB — COMPREHENSIVE METABOLIC PANEL
AST: 30
Albumin: 2.5 — ABNORMAL LOW
Alkaline Phosphatase: 65
BUN: 27 — ABNORMAL HIGH
Chloride: 113 — ABNORMAL HIGH
GFR calc Af Amer: 34 — ABNORMAL LOW
Potassium: 4.3
Total Bilirubin: 0.5
Total Protein: 5.7 — ABNORMAL LOW

## 2011-05-27 LAB — CROSSMATCH
ABO/RH(D): O POS
Antibody Screen: NEGATIVE
Donor AG Type: NEGATIVE

## 2011-06-01 LAB — COMPREHENSIVE METABOLIC PANEL
ALT: 19
AST: 27
CO2: 20
Calcium: 9
GFR calc Af Amer: 19 — ABNORMAL LOW
GFR calc non Af Amer: 15 — ABNORMAL LOW
Potassium: 5.6 — ABNORMAL HIGH
Sodium: 139
Total Protein: 6.2

## 2011-06-01 LAB — GLUCOSE, CAPILLARY
Glucose-Capillary: 104 — ABNORMAL HIGH
Glucose-Capillary: 105 — ABNORMAL HIGH
Glucose-Capillary: 114 — ABNORMAL HIGH
Glucose-Capillary: 115 — ABNORMAL HIGH
Glucose-Capillary: 138 — ABNORMAL HIGH
Glucose-Capillary: 142 — ABNORMAL HIGH
Glucose-Capillary: 152 — ABNORMAL HIGH
Glucose-Capillary: 160 — ABNORMAL HIGH
Glucose-Capillary: 39 — CL
Glucose-Capillary: 68 — ABNORMAL LOW
Glucose-Capillary: 74
Glucose-Capillary: 80
Glucose-Capillary: 87
Glucose-Capillary: 89
Glucose-Capillary: 92

## 2011-06-01 LAB — BASIC METABOLIC PANEL
BUN: 14
BUN: 15
BUN: 15
CO2: 18 — ABNORMAL LOW
Calcium: 8.6
Calcium: 8.7
Chloride: 113 — ABNORMAL HIGH
Chloride: 117 — ABNORMAL HIGH
Creatinine, Ser: 1.5 — ABNORMAL HIGH
Creatinine, Ser: 1.54 — ABNORMAL HIGH
Creatinine, Ser: 1.56 — ABNORMAL HIGH
GFR calc Af Amer: 41 — ABNORMAL LOW
GFR calc Af Amer: 42 — ABNORMAL LOW
GFR calc Af Amer: 48 — ABNORMAL LOW
GFR calc non Af Amer: 34 — ABNORMAL LOW
GFR calc non Af Amer: 35 — ABNORMAL LOW
GFR calc non Af Amer: 39 — ABNORMAL LOW
Glucose, Bld: 76
Glucose, Bld: 90
Glucose, Bld: 99
Potassium: 3.9
Potassium: 4.3
Potassium: 4.3
Potassium: 4.5
Sodium: 139
Sodium: 139
Sodium: 142

## 2011-06-01 LAB — CROSSMATCH
ABO/RH(D): O POS
ABO/RH(D): O POS
Donor AG Type: NEGATIVE
Donor AG Type: NEGATIVE

## 2011-06-01 LAB — CBC
HCT: 25.5 — ABNORMAL LOW
HCT: 25.8 — ABNORMAL LOW
HCT: 26.8 — ABNORMAL LOW
HCT: 29 — ABNORMAL LOW
Hemoglobin: 8.5 — ABNORMAL LOW
Hemoglobin: 9 — ABNORMAL LOW
Hemoglobin: 9.7 — ABNORMAL LOW
MCHC: 33.3
MCHC: 33.4
MCHC: 33.4
MCV: 84.1
MCV: 84.3
MCV: 84.3
Platelets: 241
Platelets: 243
Platelets: 250
RBC: 3.06 — ABNORMAL LOW
RBC: 3.09 — ABNORMAL LOW
RBC: 3.47 — ABNORMAL LOW
RDW: 13.9
RDW: 13.9
RDW: 14.2
RDW: 14.4
WBC: 10.4
WBC: 12.1 — ABNORMAL HIGH
WBC: 9

## 2011-06-01 LAB — HEMOGLOBIN AND HEMATOCRIT, BLOOD
HCT: 28.4 — ABNORMAL LOW
HCT: 30.3 — ABNORMAL LOW
Hemoglobin: 9.5 — ABNORMAL LOW

## 2011-06-01 LAB — APTT: aPTT: 20 — ABNORMAL LOW

## 2011-06-01 LAB — DIFFERENTIAL
Eosinophils Absolute: 0
Eosinophils Relative: 0
Lymphs Abs: 1.6
Monocytes Relative: 4

## 2011-06-03 ENCOUNTER — Encounter (INDEPENDENT_AMBULATORY_CARE_PROVIDER_SITE_OTHER): Payer: Self-pay | Admitting: General Surgery

## 2011-06-06 ENCOUNTER — Ambulatory Visit (INDEPENDENT_AMBULATORY_CARE_PROVIDER_SITE_OTHER): Payer: PRIVATE HEALTH INSURANCE | Admitting: General Surgery

## 2011-06-06 ENCOUNTER — Encounter (INDEPENDENT_AMBULATORY_CARE_PROVIDER_SITE_OTHER): Payer: Self-pay | Admitting: General Surgery

## 2011-06-06 VITALS — BP 122/78 | HR 84 | Temp 97.6°F | Resp 18 | Ht 60.0 in | Wt 149.2 lb

## 2011-06-06 DIAGNOSIS — Z9889 Other specified postprocedural states: Secondary | ICD-10-CM

## 2011-06-06 NOTE — Patient Instructions (Signed)
You have completely recovered from your colon surgery. The polyp in your colon had not become cancer yet. You should have a colonoscopy with Dr. Bosie Clos in one year. Return to see me if any further problems arise.

## 2011-06-06 NOTE — Progress Notes (Signed)
Subjective:     Patient ID: [name], female   DOB: 1946/01/26, 65 y.o.   MRN: [id number]  HPI This patient underwent laparoscopic-assisted right colectomy on April 22, 2011. The final pathology report showed a villous adenoma, no malignancy. Postop course was notable for a slightly prolonged ileus patient also developed right upper extremity pain of uncertain etiology. She had an extensive neurologic workup and nothing was found. Her symptoms resolved.  Since discharge she has done well. She feels good. She has no pain. She has no wound problems. Her appetite is good. She'll have 1-2 point bowel movements per day. See no blood in her stools.  Review of Systems     Objective:   Physical Exam The patient looks well. She is alert and oriented. She is in no distress.  Abdomen is soft and nontender. Incisions were all well healed. There are no hernias.    Assessment:     Villous adenoma of the ascending colon, recovering without surgical complications following laparoscopic-assisted right colectomy.  Congestive heart failure  Atherosclerotic peripheral vascular disease status post lower extremity stent.  Sleep apnea  Chronic obstructive pulmonary disease  Hypertension  Non-insulin-dependent diabetes mellitus  Compensated cirrhosis  History diverticular bleed  Coronary artery disease.    Plan:     Okay to resume all normal physical activities without restriction.  High-fiber low-fat diet advised.  I advised her to get a colonoscopy in one year.  Return to see me p.r.n.

## 2011-06-08 NOTE — Consult Note (Signed)
[name], [name] NO.:  [id number]  MEDICAL RECORD NO.:  [id number]  LOCATION:  5148                         FACILITY:  [facility]  PHYSICIAN:  Baltazar Najjar, MD     DATE OF BIRTH:  12-02-1945  DATE OF CONSULTATION: DATE OF DISCHARGE:                                CONSULTATION  Reason for consultation:Managment of medical problems  HP1: 65 Y/O women  with multiple comorbidities .Admited for surgical service for laproscopic colectomy for villous adenoma.We are consulted to manage medical problems.  Past Medical History : 1. Chronic diastolic, ejection fraction was 65%-70% in January 2012. 2. Mild pulmonary hypertension. 3. Chronic obstructive pulmonary disease. 4. History of liver cirrhosis. 5. Peripheral vascular disease. 6. Non-insulin dependent diabetes mellitus. 7. History of sleep apnea.  ALLERGIES:  Allergic to PENICILLIN.  Also listed as allergic to ACETAMINOPHEN.  CURRENT INPATIENT MEDICATION: 1. Alvimopan 12 mg p.o. b.i.d. 2. Pletal 50 mg p.o. b.i.d. 3. NovoLog insulin sliding scale 1-15 units subcutaneously 3 times a     day with meals. 4. Lopressor 50 mg p.o. b.i.d. 5. Singulair 10 mg p.o. at bedtime. 6. Protonix 80 mg p.o. daily. 7. Albuterol 2.5 mg inhalation q.4-6 hours p.r.n. 8. Zofran 4 mg IV q.6 h. p.r.n. 9. Oxycodone 5-10 mg p.o. q.4 h. p.r.n.  DISCONTINUED MEDICATIONS:  The patient was on Amaryl and Tradjenta, which were both discontinued this a.m. secondary to hypoglycemia.  SOCIAL HISTORY:  Lives alone.  Quit smoking in 2005.  She smoked for about 30 years.  She described her smoking history as non heavy.  She also used to drink alcohol, currently quit.  Denies any illicit drug use.  FAMILY HISTORY:  Significant for diabetes in her grandfather.  History of liver cancer in one sister.  REVIEW OF SYSTEMS:  As above in HPI.  CHEST:  She has mild shortness of breath, mild cough productive of whitish sputum.  CARDIOVASCULAR:   No chest pain.  No palpitations.  ABDOMEN:  No nausea.  No vomiting.  No diarrhea.  GU:  No burning on micturition.  No increase in frequency. No flank pain.  CONSTITUTIONAL:  No fever or chills.  PHYSICAL EXAMINATION:  VITAL SIGNS:  Blood pressure 102/52, pulse rate 97, temperature 99.1, O2 sat 94% on 3 L oxygen via nasal cannula. GENERAL:  She is alert, not in acute distress. NECK:  Supple.  No JVD. LUNGS:  Clear to auscultation bilaterally. CARDIOVASCULAR:  S1 and S2 regular rhythm and rate. ABDOMEN:  Midline surgical scar with staples in place.  Mildly distended, however, nontender.  Bowel sounds positive. EXTREMITIES:  No edema. NEUROLOGIC:  No focal neurological deficit appreciated.  LABORATORY DATA:  Lab results from April 25, 2011:  Sodium was 132, potassium 4.5, BUN 11, creatinine 1.12, GFR of 59.  WBC 16.3, hemoglobin 8, hematocrit 25.9, platelet count 305.  RADIOLOGY/IMAGING STUDIES:  Chest x-ray on April 24, 2011, showed stable bilateral pleural fluid and bibasilar atelectasis/pneumonia.  ASSESSMENT: 1. Villous adenoma, status post laparoscopic assisted right colectomy. 2. Non-insulin dependent diabetes mellitus type 2. 3. Hypertension. 4. Chronic obstructive pulmonary disease. 5. Chronic diastolic congestive heart failure. 6. Leukocytosis and abnormal chest x-ray findings. 7. History  of peripheral vascular disease on Pletal. 8. History of alcoholic liver cirrhosis seems to be compensated. 9. History of obstructive sleep apnea. 10.Hypoglycemia, now resolved.  PLAN: 1. I agree with holding oral hypoglycemic agents given her poor oral     intake and covering her with insulin sliding scale for now and     adjustment of her regimen.  Her hemoglobin A1c was 7.8, which     indicate inadequate control.  However, she will need some tuning of     her medication, especially that she is not eating very well. 2. Hypertension.  Her hypertension is well controlled. 3.  Leukocytosis and abnormal chest x-ray.  Findings worrisome for     healthcare-associated pneumonia.  However, the patient is not     significantly symptomatic.  I will go ahead and order PA and     lateral chest x-ray to rule out pneumonia, and given her hypoxemia     and low-grade fever and leukocytosis, I will go ahead and start her     on vancomycin and Levaquin.  Noted that she is penicillin allergic,     however, that can be discontinued if her chest x-ray ruled out     pneumonia. 4. Chronic diastolic congestive heart failure, compensated. 5. Mild hyponatremia, possibly multifactorial.  The patient is     receiving hypotonic fluid at 20 mL per hour, D5 half normal saline.     There is also possibility of element of SIADH post surgery and she     does have history of CHF, however, her volume status is euvolemic     at this time.  I will change her fluids to normal saline for now     and check urine sodium, urine osmolality, and serum osmolality. 6. Alcoholic liver cirrhosis, compensated, can be followed as an     outpatient. 7. COPD/obstructive sleep apnea.  Continue with meds and nebulizer     treatment.  I will also order for CPAP for sleep apnea at bedtime. 8. DVT and GI prophylaxis. 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 10.Status post laparoscopic colectomy, seems to be doing well.  Follow     up as per Dr. Derrell Lolling.  Thank you for allowing [place] to participate in the care of [name].  We will follow along with you.          ______________________________ Baltazar Najjar, MD     SA/MEDQ  D:  04/26/2011  T:  04/26/2011  Job:  [id number]  Electronically Signed by Hannah Beat MD on 06/08/2011 07:54:52 PM

## 2011-06-10 LAB — COMPREHENSIVE METABOLIC PANEL
ALT: <8
AST: 19
AST: 27
Albumin: 2.2 — ABNORMAL LOW
Albumin: 2.2 — ABNORMAL LOW
Albumin: 2.4 — ABNORMAL LOW
Albumin: 3.3 — ABNORMAL LOW
Alkaline Phosphatase: 59
Alkaline Phosphatase: 91
Alkaline Phosphatase: 91
BUN: 15
BUN: 19
BUN: 35 — ABNORMAL HIGH
BUN: 48 — ABNORMAL HIGH
BUN: 6
CO2: 17 — ABNORMAL LOW
Calcium: 8.5
Calcium: 8.6
Chloride: 109
Chloride: 110
Creatinine, Ser: 1.24 — ABNORMAL HIGH
Creatinine, Ser: 1.27 — ABNORMAL HIGH
Creatinine, Ser: 1.52 — ABNORMAL HIGH
GFR calc Af Amer: 35 — ABNORMAL LOW
GFR calc Af Amer: 52 — ABNORMAL LOW
GFR calc non Af Amer: 43 — ABNORMAL LOW
Glucose, Bld: 126 — ABNORMAL HIGH
Glucose, Bld: 129 — ABNORMAL HIGH
Glucose, Bld: 195 — ABNORMAL HIGH
Potassium: 4
Potassium: 4
Potassium: 4.1
Potassium: 5.1
Sodium: 133 — ABNORMAL LOW
Total Bilirubin: 0.3
Total Bilirubin: UNDETERMINED
Total Protein: 5.1 — ABNORMAL LOW
Total Protein: 5.2 — ABNORMAL LOW
Total Protein: 6.8

## 2011-06-10 LAB — CBC
HCT: 22.8 — ABNORMAL LOW
HCT: 23.4 — ABNORMAL LOW
HCT: 24.3 — ABNORMAL LOW
HCT: 24.8 — ABNORMAL LOW
HCT: 25 — ABNORMAL LOW
HCT: 25.3 — ABNORMAL LOW
HCT: 26.3 — ABNORMAL LOW
HCT: 27.9 — ABNORMAL LOW
HCT: 34.5 — ABNORMAL LOW
Hemoglobin: 11.5 — ABNORMAL LOW
Hemoglobin: 7.6 — CL
Hemoglobin: 8.3 — ABNORMAL LOW
MCHC: 32.9
MCHC: 33.4
MCHC: 33.8
MCV: 83.4
MCV: 84.2
MCV: 85.1
Platelets: 267
Platelets: 289
Platelets: 292
Platelets: 295
Platelets: 317
Platelets: 330
Platelets: ADEQUATE
RBC: 2.99 — ABNORMAL LOW
RBC: 3.33 — ABNORMAL LOW
RDW: 14.7 — ABNORMAL HIGH
RDW: 14.8 — ABNORMAL HIGH
RDW: 15 — ABNORMAL HIGH
RDW: 15.1 — ABNORMAL HIGH
RDW: 15.2 — ABNORMAL HIGH
RDW: 15.3 — ABNORMAL HIGH
RDW: 15.4 — ABNORMAL HIGH
WBC: 10
WBC: 12.6 — ABNORMAL HIGH
WBC: 12.8 — ABNORMAL HIGH
WBC: 13.4 — ABNORMAL HIGH
WBC: 9.3

## 2011-06-10 LAB — DIFFERENTIAL
Basophils Absolute: 0
Basophils Absolute: 0
Basophils Absolute: 0.1
Basophils Relative: 1
Eosinophils Absolute: 0.1
Eosinophils Absolute: 0.1
Eosinophils Relative: 1
Eosinophils Relative: 1
Lymphocytes Relative: 16
Lymphocytes Relative: 23
Lymphs Abs: 2.1
Lymphs Abs: 2.3
Lymphs Abs: 3.4 — ABNORMAL HIGH
Monocytes Relative: 10
Monocytes Relative: 9
Neutro Abs: 6.6
Neutro Abs: 7.4
Neutro Abs: 8.1 — ABNORMAL HIGH
Neutrophils Relative %: 71

## 2011-06-10 LAB — PHOSPHORUS
Phosphorus: 2.4
Phosphorus: 3.3
Phosphorus: 4.7 — ABNORMAL HIGH

## 2011-06-10 LAB — CROSSMATCH
ABO/RH(D): O POS
Antibody Screen: POSITIVE
Donor AG Type: NEGATIVE

## 2011-06-10 LAB — BASIC METABOLIC PANEL
BUN: 12
BUN: 20
BUN: 34 — ABNORMAL HIGH
BUN: 34 — ABNORMAL HIGH
CO2: 21
Calcium: 8.6
Calcium: 8.7
Calcium: 8.8
Calcium: 8.8
Calcium: 8.9
Chloride: 111
Creatinine, Ser: 1.31 — ABNORMAL HIGH
Creatinine, Ser: 1.36 — ABNORMAL HIGH
GFR calc Af Amer: 50 — ABNORMAL LOW
GFR calc Af Amer: 51 — ABNORMAL LOW
GFR calc non Af Amer: 30 — ABNORMAL LOW
GFR calc non Af Amer: 34 — ABNORMAL LOW
GFR calc non Af Amer: 35 — ABNORMAL LOW
GFR calc non Af Amer: 39 — ABNORMAL LOW
GFR calc non Af Amer: 41 — ABNORMAL LOW
Glucose, Bld: 121 — ABNORMAL HIGH
Glucose, Bld: 209 — ABNORMAL HIGH
Glucose, Bld: 52 — ABNORMAL LOW
Glucose, Bld: 92
Potassium: 3.1 — ABNORMAL LOW
Potassium: 3.5
Potassium: 3.8
Potassium: 4.5
Sodium: 133 — ABNORMAL LOW
Sodium: 134 — ABNORMAL LOW

## 2011-06-10 LAB — PREALBUMIN
Prealbumin: 10 — ABNORMAL LOW
Prealbumin: 16 — ABNORMAL LOW

## 2011-06-10 LAB — MAGNESIUM
Magnesium: 1 — ABNORMAL LOW
Magnesium: 2.1

## 2011-06-10 LAB — URINALYSIS, ROUTINE W REFLEX MICROSCOPIC
Glucose, UA: NEGATIVE
pH: 5.5

## 2011-06-10 LAB — URINE MICROSCOPIC-ADD ON

## 2011-06-10 LAB — TRIGLYCERIDES
Triglycerides: 107
Triglycerides: 72

## 2011-06-10 LAB — CHOLESTEROL, TOTAL
Cholesterol: 56
Cholesterol: 69

## 2011-06-14 LAB — TYPE AND SCREEN: DAT, IgG: NEGATIVE

## 2011-06-14 LAB — URINE CULTURE
Colony Count: 40000
Special Requests: NEGATIVE

## 2011-06-14 LAB — COMPREHENSIVE METABOLIC PANEL
ALT: 11
Albumin: 2.9 — ABNORMAL LOW
Alkaline Phosphatase: 66
CO2: 21
Chloride: 104
GFR calc Af Amer: 14 — ABNORMAL LOW
GFR calc Af Amer: 16 — ABNORMAL LOW
Glucose, Bld: 120 — ABNORMAL HIGH
Potassium: 4.2
Sodium: 136
Sodium: 136
Total Protein: 7.2

## 2011-06-14 LAB — BASIC METABOLIC PANEL
CO2: 20
CO2: 21
Calcium: 8.7
Calcium: 8.9
Chloride: 110
Creatinine, Ser: 2.7 — ABNORMAL HIGH
Creatinine, Ser: 3.02 — ABNORMAL HIGH
Glucose, Bld: 104 — ABNORMAL HIGH
Glucose, Bld: 98

## 2011-06-14 LAB — DIFFERENTIAL
Eosinophils Relative: 0
Lymphocytes Relative: 13
Lymphocytes Relative: 14
Lymphs Abs: 2
Lymphs Abs: 2.1
Monocytes Absolute: 0.7
Monocytes Relative: 5
Neutro Abs: 12.7 — ABNORMAL HIGH
Neutrophils Relative %: 81 — ABNORMAL HIGH

## 2011-06-14 LAB — IRON AND TIBC
Saturation Ratios: 34
TIBC: 195 — ABNORMAL LOW
UIBC: 128

## 2011-06-14 LAB — CBC
HCT: 27.1 — ABNORMAL LOW
Hemoglobin: 8.9 — ABNORMAL LOW
Hemoglobin: 9.4 — ABNORMAL LOW
Hemoglobin: 9.6 — ABNORMAL LOW
MCHC: 33.2
Platelets: 513 — ABNORMAL HIGH
RBC: 3.34 — ABNORMAL LOW
RBC: 3.35 — ABNORMAL LOW
WBC: 15.6 — ABNORMAL HIGH
WBC: 15.7 — ABNORMAL HIGH
WBC: 16.1 — ABNORMAL HIGH

## 2011-06-14 LAB — PROTIME-INR
INR: 1.3
Prothrombin Time: 16.1 — ABNORMAL HIGH

## 2011-06-14 LAB — OCCULT BLOOD X 1 CARD TO LAB, STOOL: Fecal Occult Bld: POSITIVE

## 2011-06-14 LAB — CULTURE, BLOOD (ROUTINE X 2): Culture: NO GROWTH

## 2011-06-14 LAB — POCT CARDIAC MARKERS: Troponin i, poc: <0.05

## 2011-06-14 LAB — SEDIMENTATION RATE: Sed Rate: 101 — ABNORMAL HIGH

## 2011-06-15 LAB — COMPREHENSIVE METABOLIC PANEL
ALT: 21
AST: 24
Alkaline Phosphatase: 63
BUN: 19
CO2: 17 — ABNORMAL LOW
CO2: 19
Calcium: 7.9 — ABNORMAL LOW
Chloride: 111
Creatinine, Ser: 1.74 — ABNORMAL HIGH
GFR calc Af Amer: 36 — ABNORMAL LOW
GFR calc non Af Amer: 25 — ABNORMAL LOW
GFR calc non Af Amer: 30 — ABNORMAL LOW
Glucose, Bld: 52 — ABNORMAL LOW
Potassium: 3.6
Sodium: 138
Total Bilirubin: 0.5
Total Protein: 5.3 — ABNORMAL LOW

## 2011-06-15 LAB — CBC
HCT: 22.1 — ABNORMAL LOW
HCT: 23.8 — ABNORMAL LOW
HCT: 24.4 — ABNORMAL LOW
HCT: 24.5 — ABNORMAL LOW
HCT: 24.9 — ABNORMAL LOW
HCT: 26.4 — ABNORMAL LOW
HCT: 29 — ABNORMAL LOW
Hemoglobin: 10.1 — ABNORMAL LOW
Hemoglobin: 7.5 — CL
Hemoglobin: 7.5 — CL
Hemoglobin: 8 — ABNORMAL LOW
Hemoglobin: 8.7 — ABNORMAL LOW
Hemoglobin: 8.9 — ABNORMAL LOW
MCHC: 33.7
MCHC: 34.1
MCHC: 34.6
MCHC: 35.3
MCV: 82.7
MCV: 83.4
MCV: 89.2
Platelets: 491 — ABNORMAL HIGH
Platelets: 534 — ABNORMAL HIGH
Platelets: 548 — ABNORMAL HIGH
Platelets: 557 — ABNORMAL HIGH
Platelets: 608 — ABNORMAL HIGH
RBC: 2.52 — ABNORMAL LOW
RBC: 2.81 — ABNORMAL LOW
RBC: 2.86 — ABNORMAL LOW
RBC: 2.89 — ABNORMAL LOW
RBC: 2.95 — ABNORMAL LOW
RBC: 2.96 — ABNORMAL LOW
RBC: 2.96 — ABNORMAL LOW
RBC: 3.34 — ABNORMAL LOW
RDW: 13.8
RDW: 14.5 — ABNORMAL HIGH
WBC: 13.6 — ABNORMAL HIGH
WBC: 13.7 — ABNORMAL HIGH
WBC: 13.9 — ABNORMAL HIGH
WBC: 14.9 — ABNORMAL HIGH
WBC: 16.6 — ABNORMAL HIGH
WBC: 25.7 — ABNORMAL HIGH
WBC: 28.2 — ABNORMAL HIGH
WBC: 29.1 — ABNORMAL HIGH
WBC: 32.4 — ABNORMAL HIGH

## 2011-06-15 LAB — AMYLASE: Amylase: 72

## 2011-06-15 LAB — CROSSMATCH
Antibody Screen: NEGATIVE
PT AG Type: POSITIVE

## 2011-06-15 LAB — BASIC METABOLIC PANEL
BUN: 14
BUN: 14
BUN: 17
BUN: 20
BUN: 9
CO2: 17 — ABNORMAL LOW
CO2: 17 — ABNORMAL LOW
Calcium: 8 — ABNORMAL LOW
Calcium: 8 — ABNORMAL LOW
Calcium: 8.2 — ABNORMAL LOW
Calcium: 8.4
Chloride: 108
Chloride: 111
Chloride: 111
Creatinine, Ser: 2.01 — ABNORMAL HIGH
Creatinine, Ser: 2.1 — ABNORMAL HIGH
GFR calc Af Amer: 29 — ABNORMAL LOW
GFR calc Af Amer: 29 — ABNORMAL LOW
GFR calc Af Amer: 30 — ABNORMAL LOW
GFR calc Af Amer: 31 — ABNORMAL LOW
GFR calc Af Amer: 31 — ABNORMAL LOW
GFR calc Af Amer: 31 — ABNORMAL LOW
GFR calc Af Amer: 35 — ABNORMAL LOW
GFR calc non Af Amer: 24 — ABNORMAL LOW
GFR calc non Af Amer: 25 — ABNORMAL LOW
GFR calc non Af Amer: 26 — ABNORMAL LOW
GFR calc non Af Amer: 26 — ABNORMAL LOW
GFR calc non Af Amer: 26 — ABNORMAL LOW
GFR calc non Af Amer: 29 — ABNORMAL LOW
GFR calc non Af Amer: 31 — ABNORMAL LOW
Glucose, Bld: 83
Glucose, Bld: 98
Potassium: 3.4 — ABNORMAL LOW
Potassium: 3.4 — ABNORMAL LOW
Potassium: 3.8
Potassium: 4
Potassium: 4
Potassium: 4
Sodium: 134 — ABNORMAL LOW
Sodium: 136
Sodium: 136
Sodium: 137
Sodium: 138

## 2011-06-15 LAB — LIPASE, BLOOD: Lipase: 8 — ABNORMAL LOW

## 2011-06-15 LAB — VITAMIN B12: Vitamin B-12: 1739 — ABNORMAL HIGH (ref 211–911)

## 2011-06-15 LAB — DIFFERENTIAL
Basophils Relative: 0
Eosinophils Relative: 0
Lymphocytes Relative: 7 — ABNORMAL LOW
Monocytes Relative: 4
Neutro Abs: 25.9 — ABNORMAL HIGH
Neutrophils Relative %: 89 — ABNORMAL HIGH

## 2011-06-15 LAB — IRON AND TIBC
Iron: 14 — ABNORMAL LOW
TIBC: 126 — ABNORMAL LOW
UIBC: 112

## 2011-06-15 LAB — APTT: aPTT: 37

## 2011-06-15 LAB — HEMOGLOBIN AND HEMATOCRIT, BLOOD
HCT: 27 — ABNORMAL LOW
Hemoglobin: 9.2 — ABNORMAL LOW

## 2011-06-15 LAB — OCCULT BLOOD X 1 CARD TO LAB, STOOL: Fecal Occult Bld: POSITIVE

## 2011-06-15 LAB — FERRITIN: Ferritin: 1179 — ABNORMAL HIGH (ref 10–291)

## 2011-06-15 LAB — ABO/RH: ABO/RH(D): O POS

## 2011-06-16 LAB — CULTURE, BLOOD (ROUTINE X 2): Culture: NO GROWTH

## 2011-06-16 LAB — URINE CULTURE
Colony Count: >=100000
Special Requests: NEGATIVE

## 2011-06-16 LAB — BASIC METABOLIC PANEL
BUN: 24 — ABNORMAL HIGH
BUN: 38 — ABNORMAL HIGH
BUN: 40 — ABNORMAL HIGH
BUN: 42 — ABNORMAL HIGH
BUN: 45 — ABNORMAL HIGH
CO2: 19
CO2: 19
CO2: 19
CO2: 22
Calcium: 8 — ABNORMAL LOW
Calcium: 8.2 — ABNORMAL LOW
Calcium: 8.6
Chloride: 100
Chloride: 101
Chloride: 106
Creatinine, Ser: 1.74 — ABNORMAL HIGH
Creatinine, Ser: 1.77 — ABNORMAL HIGH
Creatinine, Ser: 2.09 — ABNORMAL HIGH
Creatinine, Ser: 2.24 — ABNORMAL HIGH
Creatinine, Ser: 2.35 — ABNORMAL HIGH
Creatinine, Ser: 2.38 — ABNORMAL HIGH
GFR calc Af Amer: 25 — ABNORMAL LOW
GFR calc Af Amer: 25 — ABNORMAL LOW
GFR calc Af Amer: 27 — ABNORMAL LOW
GFR calc Af Amer: 29 — ABNORMAL LOW
GFR calc Af Amer: 35 — ABNORMAL LOW
GFR calc non Af Amer: 21 — ABNORMAL LOW
GFR calc non Af Amer: 21 — ABNORMAL LOW
GFR calc non Af Amer: 22 — ABNORMAL LOW
GFR calc non Af Amer: 30 — ABNORMAL LOW
Glucose, Bld: 114 — ABNORMAL HIGH
Glucose, Bld: 156 — ABNORMAL HIGH
Glucose, Bld: 99
Potassium: 4
Potassium: 4.9
Sodium: 128 — ABNORMAL LOW
Sodium: 132 — ABNORMAL LOW

## 2011-06-16 LAB — CBC
HCT: 29.9 — ABNORMAL LOW
HCT: 30.6 — ABNORMAL LOW
HCT: 32.2 — ABNORMAL LOW
HCT: 34.7 — ABNORMAL LOW
Hemoglobin: 10.1 — ABNORMAL LOW
Hemoglobin: 10.3 — ABNORMAL LOW
Hemoglobin: 11.6 — ABNORMAL LOW
MCHC: 33.4
MCHC: 33.8
MCHC: 34.2
MCV: 82.3
MCV: 82.6
MCV: 82.7
MCV: 82.7
Platelets: 235
Platelets: 237
Platelets: 351
Platelets: 414 — ABNORMAL HIGH
RBC: 3.23 — ABNORMAL LOW
RBC: 3.32 — ABNORMAL LOW
RBC: 3.7 — ABNORMAL LOW
RBC: 4.19
RDW: 13
RDW: 13.1
RDW: 13.5
RDW: 13.5
WBC: 22.2 — ABNORMAL HIGH
WBC: 22.9 — ABNORMAL HIGH
WBC: 25.2 — ABNORMAL HIGH
WBC: 30.5 — ABNORMAL HIGH

## 2011-06-16 LAB — TOBRAMYCIN LEVEL, RANDOM: Tobramycin Rm: 11.3

## 2011-06-16 LAB — HEMOGLOBIN A1C: Hgb A1c MFr Bld: 7.6 — ABNORMAL HIGH

## 2011-06-16 LAB — URINALYSIS, ROUTINE W REFLEX MICROSCOPIC
Bilirubin Urine: NEGATIVE
Glucose, UA: >1000 — AB
Ketones, ur: NEGATIVE
Specific Gravity, Urine: 1.018
pH: 5.5

## 2011-06-16 LAB — COMPREHENSIVE METABOLIC PANEL
ALT: 32
AST: 37
Albumin: 2.4 — ABNORMAL LOW
Alkaline Phosphatase: 88
Alkaline Phosphatase: 93
BUN: 39 — ABNORMAL HIGH
BUN: 43 — ABNORMAL HIGH
Chloride: 100
Chloride: 98
Creatinine, Ser: 2.59 — ABNORMAL HIGH
Glucose, Bld: 180 — ABNORMAL HIGH
Potassium: 4.3
Potassium: 4.4
Sodium: 130 — ABNORMAL LOW
Total Bilirubin: 0.4
Total Bilirubin: 0.5
Total Protein: 5.6 — ABNORMAL LOW
Total Protein: 6.1

## 2011-06-16 LAB — AMMONIA: Ammonia: 35

## 2011-06-16 LAB — LIPASE, BLOOD: Lipase: <10 — ABNORMAL LOW

## 2011-06-16 LAB — DIFFERENTIAL
Basophils Absolute: 0
Basophils Relative: 0
Eosinophils Absolute: 0
Eosinophils Relative: 0
Monocytes Absolute: 1.6 — ABNORMAL HIGH
Monocytes Relative: 7
Neutro Abs: 22.5 — ABNORMAL HIGH

## 2011-06-16 LAB — URINE MICROSCOPIC-ADD ON

## 2011-10-04 ENCOUNTER — Other Ambulatory Visit: Payer: Self-pay | Admitting: Internal Medicine

## 2011-10-04 DIAGNOSIS — Z1231 Encounter for screening mammogram for malignant neoplasm of breast: Secondary | ICD-10-CM

## 2011-11-11 ENCOUNTER — Ambulatory Visit
Admission: RE | Admit: 2011-11-11 | Discharge: 2011-11-11 | Disposition: A | Discharge status: Home / Self Care | Payer: PRIVATE HEALTH INSURANCE | Source: Ambulatory Visit | Attending: Internal Medicine | Admitting: Internal Medicine

## 2011-11-11 DIAGNOSIS — Z1231 Encounter for screening mammogram for malignant neoplasm of breast: Secondary | ICD-10-CM

## 2012-01-09 ENCOUNTER — Encounter (INDEPENDENT_AMBULATORY_CARE_PROVIDER_SITE_OTHER): Payer: Self-pay | Admitting: General Surgery

## 2012-01-09 ENCOUNTER — Ambulatory Visit (INDEPENDENT_AMBULATORY_CARE_PROVIDER_SITE_OTHER): Payer: Medicaid Other | Admitting: General Surgery

## 2012-01-09 VITALS — BP 112/82 | HR 64 | Temp 97.6°F | Resp 16 | Ht 60.0 in | Wt 156.6 lb

## 2012-01-09 DIAGNOSIS — K432 Incisional hernia without obstruction or gangrene: Secondary | ICD-10-CM | POA: Insufficient documentation

## 2012-01-09 NOTE — Progress Notes (Signed)
Patient ID: [name], female   DOB: [birth year], 66 y.o.   MRN: [id number]  Chief Complaint  Patient presents with  . Routine Post Op    Colon Sx on 04/22/2011   HPI  This is a 66 year old African-American female who was referred back to me by Dr. Fleet Contras  for evaluation of a ventral incisional hernia.  This patient has significant medical problems including obesity, congestive heart failure, peripheral vascular disease, obstructive sleep apnea, COPD, compensated cirrhosis, hypertension, coronary disease, and non-insulin-dependent diabetes mellitus.  On April 22, 2011 she underwent laparoscopic-assisted right colectomy. I noted that the fascia was very thin and under the liver was discolored but was not nodular and I do not think there was any ascites at that time. Final pathology report showed a villous adenoma, all lymph node tissue were negative. She recovered uneventfully.  She states that she noticed a bulge in the center of her abdomen for about 3 months. She says it is enlarging and is a little bit uncomfortable but no severe pain patient denies nausea vomiting or obstructive symptoms. Her appetite is normal. Her bowel movements are normal. She has gained some weight. HPI  Past Medical History  Diagnosis Date  . Alcoholic cirrhosis   . Reflux esophagitis   . Gastroesophageal reflux   . Colon polyps   . CHF (congestive heart failure)   . Diabetes mellitus   . Hypertension   . History of GI diverticular bleed   . Pneumonia     history of  . Asthma   . Poor circulation   . Anemia   . Blood transfusion   . Sinus problem   . Wears glasses   . Villous adenoma of colon 05/10/11  . PVD (peripheral vascular disease)   . Sleep apnea   . Chronic obstructive pulmonary disease (COPD)   . Cirrhosis     Past Surgical History  Procedure Date  . Abdominal hysterectomy   . Cataract extraction   . Colonoscopy   . Laparoscopic assissted total colectomy w/ j-pouch 04/22/11      Family History  Problem Relation Age of Onset  . Cancer Brother     heent ca  . Cancer Sister     liver ca    Social History History  Substance Use Topics  . Smoking status: Former Smoker    Quit date: 12/17/2010  . Smokeless tobacco: Not on file  . Alcohol Use: No     stopped 2000    Allergies  Allergen Reactions  . Penicillins Hives    Current Outpatient Prescriptions  Medication Sig Dispense Refill  . albuterol (PROVENTIL) (5 MG/ML) 0.5% nebulizer solution Take 2.5 mg by nebulization every 6 (six) hours as needed. Have pt verify doseage       . aspirin 81 MG EC tablet Take 81 mg by mouth daily.        . Cholecalciferol (VITAMIN D-3) 5000 UNITS TABS Take by mouth.      . cilostazol (PLETAL) 100 MG tablet Take 100 mg by mouth 2 (two) times daily before a meal.       . diclofenac (VOLTAREN) 0.1 % ophthalmic solution 1 drop 4 (four) times daily. Clarify if opthalmic       . diphenhydrAMINE (BENADRYL) 25 MG tablet Take 25 mg by mouth every 6 (six) hours as needed.      . fluticasone (FLONASE) 50 MCG/ACT nasal spray Place 2 sprays into the nose daily. 50 mcg /act 2 sprays nasal  once a day       . folic acid (FOLVITE) 1 MG tablet Take 1 mg by mouth daily.      Marland Kitchen glimepiride (AMARYL) 2 MG tablet Take 4 mg by mouth daily before breakfast.       . lidocaine (LIDODERM) 5 % Place 1 patch onto the skin daily. Remove & Discard patch within 12 hours or as directed by MD       . metoprolol succinate (TOPROL-XL) 25 MG 24 hr tablet Take 50 mg by mouth daily.       . montelukast (SINGULAIR) 10 MG tablet Take 10 mg by mouth at bedtime.        Marland Kitchen omeprazole (PRILOSEC) 40 MG capsule Take 40 mg by mouth daily.        Marland Kitchen senna (SENOKOT) 8.6 MG tablet Take 1 tablet by mouth daily.        . sitaGLIPtan (JANUVIA) 50 MG tablet Take 50 mg by mouth daily.        . temazepam (RESTORIL) 30 MG capsule Take 30 mg by mouth at bedtime as needed.        Review of Systems Review of Systems   Constitutional: Negative for fever, chills and unexpected weight change.  HENT: Negative for hearing loss, congestion, sore throat, trouble swallowing and voice change.   Eyes: Negative for visual disturbance.  Respiratory: Positive for shortness of breath. Negative for cough and wheezing.   Cardiovascular: Negative for chest pain, palpitations and leg swelling.  Gastrointestinal: Positive for abdominal pain and abdominal distention. Negative for nausea, vomiting, diarrhea, constipation, blood in stool and anal bleeding.  Genitourinary: Negative for hematuria, vaginal bleeding and difficulty urinating.  Musculoskeletal: Negative for arthralgias.  Skin: Negative for rash and wound.  Neurological: Negative for seizures, syncope and headaches.  Hematological: Negative for adenopathy. Does not bruise/bleed easily.  Psychiatric/Behavioral: Negative for confusion.    Blood pressure 112/82, pulse 64, temperature 97.6 F (36.4 C), temperature source Temporal, resp. rate 16, height 5' (1.524 m), weight 156 lb 9.6 oz (71.033 kg).  Physical Exam Physical Exam  Constitutional: She is oriented to person, place, and time. She appears well-developed and well-nourished. No distress.       BMI 30.58  HENT:  Head: Normocephalic and atraumatic.  Nose: Nose normal.  Mouth/Throat: No oropharyngeal exudate.  Eyes: Conjunctivae and EOM are normal. Pupils are equal, round, and reactive to light. Left eye exhibits no discharge. No scleral icterus.  Neck: Neck supple. No JVD present. No tracheal deviation present. No thyromegaly present.  Cardiovascular: Normal rate, regular rhythm, normal heart sounds and intact distal pulses.   No murmur heard. Pulmonary/Chest: Effort normal and breath sounds normal. No respiratory distress. She has no wheezes. She has no rales. She exhibits no tenderness.  Abdominal: Soft. Bowel sounds are normal. She exhibits no distension and no mass. There is no tenderness. There is no  rebound and no guarding.       Abdomen very protuberant. Liver edge is not palpable. Possible fluid wave. Midline scar healed, but reducible hernia with defect at least 6 cm.  Musculoskeletal: She exhibits no edema and no tenderness.  Lymphadenopathy:    She has no cervical adenopathy.  Neurological: She is alert and oriented to person, place, and time. She exhibits normal muscle tone. Coordination normal.  Skin: Skin is warm. No rash noted. She is not diaphoretic. No erythema. No pallor.  Psychiatric: She has a normal mood and affect. Her behavior is normal. Judgment and  thought content normal.    Data Reviewed Old chart and records  Assessment    Ventral incisional hernia, reducible, no history or evidence of incarceration or obstruction at this time.  Obesity  Congestive heart failure  Peripheral vascular disease  Obstructive sleep apnea  COPD  Cirrhosis, radius and decompensated  Hypertension  Non-insulin-dependent diabetes mellitus  Coronary disease    Plan    I informed her that she has a ventral hernia, and that this will require an operation if she wishes to prevent progressive enlargement, pain, and theoretical possibility of obstruction.  She is aware that she is a high risk patient because of her multiple medical problems, including liver disease and possibly ascites.  She will be scheduled for a CT scan of the abdomen and pelvis to define the extent of the hernia and to see whether she has ascites or not.  She'll return to see me for further discussion of risk and benefit after the CT scan is done.      [name]. Derrell Lolling, M.D., [facility] General and Minimally invasive Surgery Breast and Colorectal Surgery Office:   [phone] Pager:   [phone]  01/09/2012, 5:12 PM

## 2012-01-09 NOTE — Patient Instructions (Signed)
You have an incisional hernia in the center of your abdominal wall. This is the cause of the bulge. This may require an operation.  You will be scheduled for a CT scan of the abdomen and pelvis to better define the extent of the hernia and to see if you have any progression of your liver disease.  You will return to see Dr. Derrell Lolling after the CT scan is done.

## 2012-01-11 ENCOUNTER — Other Ambulatory Visit (INDEPENDENT_AMBULATORY_CARE_PROVIDER_SITE_OTHER): Payer: Self-pay | Admitting: General Surgery

## 2012-01-11 ENCOUNTER — Telehealth (INDEPENDENT_AMBULATORY_CARE_PROVIDER_SITE_OTHER): Payer: Self-pay

## 2012-01-11 NOTE — Telephone Encounter (Signed)
I returned pt call. She states she thinks she has her transport worked out and will keep imaging appt.

## 2012-01-13 ENCOUNTER — Ambulatory Visit
Admission: RE | Admit: 2012-01-13 | Discharge: 2012-01-13 | Disposition: A | Discharge status: Home / Self Care | Payer: PRIVATE HEALTH INSURANCE | Source: Ambulatory Visit | Attending: General Surgery | Admitting: General Surgery

## 2012-01-13 DIAGNOSIS — K432 Incisional hernia without obstruction or gangrene: Secondary | ICD-10-CM

## 2012-01-13 MED ORDER — IOHEXOL 300 MG/ML  SOLN
100.0000 mL | Freq: Once | INTRAMUSCULAR | Status: AC | PRN
  Administered 2012-01-13: 100 mL via INTRAVENOUS

## 2012-01-17 ENCOUNTER — Telehealth (INDEPENDENT_AMBULATORY_CARE_PROVIDER_SITE_OTHER): Payer: Self-pay

## 2012-01-17 ENCOUNTER — Other Ambulatory Visit (INDEPENDENT_AMBULATORY_CARE_PROVIDER_SITE_OTHER): Payer: Self-pay

## 2012-01-17 DIAGNOSIS — N289 Disorder of kidney and ureter, unspecified: Secondary | ICD-10-CM

## 2012-01-17 NOTE — Telephone Encounter (Signed)
Pt advised of CT result per Dr Jacinto Halim request. Pt advised of need for Urology consult. Order given to [facility].

## 2012-02-16 ENCOUNTER — Other Ambulatory Visit (HOSPITAL_COMMUNITY): Payer: Self-pay | Admitting: Urology

## 2012-02-16 DIAGNOSIS — N281 Cyst of kidney, acquired: Secondary | ICD-10-CM

## 2012-02-17 ENCOUNTER — Ambulatory Visit (HOSPITAL_COMMUNITY)
Admission: RE | Admit: 2012-02-17 | Discharge: 2012-02-17 | Disposition: A | Discharge status: Home / Self Care | Payer: PRIVATE HEALTH INSURANCE | Source: Ambulatory Visit | Attending: Urology | Admitting: Urology

## 2012-02-17 DIAGNOSIS — N281 Cyst of kidney, acquired: Secondary | ICD-10-CM

## 2012-02-17 DIAGNOSIS — Q619 Cystic kidney disease, unspecified: Secondary | ICD-10-CM | POA: Insufficient documentation

## 2012-02-17 MED ORDER — GADOBENATE DIMEGLUMINE 529 MG/ML IV SOLN
14.0000 mL | Freq: Once | INTRAVENOUS | Status: AC | PRN
  Administered 2012-02-17: 14 mL via INTRAVENOUS

## 2012-02-20 ENCOUNTER — Encounter (INDEPENDENT_AMBULATORY_CARE_PROVIDER_SITE_OTHER): Payer: Self-pay | Admitting: General Surgery

## 2012-02-20 ENCOUNTER — Other Ambulatory Visit (INDEPENDENT_AMBULATORY_CARE_PROVIDER_SITE_OTHER): Payer: Self-pay | Admitting: General Surgery

## 2012-03-06 ENCOUNTER — Ambulatory Visit (INDEPENDENT_AMBULATORY_CARE_PROVIDER_SITE_OTHER): Payer: PRIVATE HEALTH INSURANCE | Admitting: General Surgery

## 2012-03-06 ENCOUNTER — Encounter (INDEPENDENT_AMBULATORY_CARE_PROVIDER_SITE_OTHER): Payer: Self-pay | Admitting: General Surgery

## 2012-03-06 VITALS — BP 142/80 | HR 84 | Temp 98.2°F | Resp 18 | Ht 60.0 in | Wt 155.8 lb

## 2012-03-06 DIAGNOSIS — K432 Incisional hernia without obstruction or gangrene: Secondary | ICD-10-CM

## 2012-03-06 NOTE — Patient Instructions (Signed)
You have  a moderate to moderately large ventral incisional hernia, and this is causing pain. Your CT scan confirms the presence of the hernia and the transverse colon is near the hernia but it is not obstructing the intestinal tract at this time. There is a risk that this could happen.  Dr. Derrell Lolling has advised you that it is appropriate to consider elective surgery to repair this. The alternative is to wear an abdominal binder to control your discomfort.  You have stated that you think that you would like to have surgery and we have discussed the numerous risks associated with this.  You are going  to see your cardiologist, Dr. Nanetta Batty next month. You will call me back when you see what he says about the  cardiac risk associated with a major abdominal operation.

## 2012-03-06 NOTE — Progress Notes (Addendum)
Patient ID: [name], female   DOB: [birth year], 66 y.o.   MRN: [id number]  Chief Complaint  Patient presents with  . Follow-up     HPI [name] is a 66 y.o. female.  She returns for further discussion regarding management of her ventral incisional hernia.  Recall this patient underwent laparoscopic-assisted right colectomy by me on April 22, 2011. She had a villous adenoma.  Her comorbidities include obesity, congestive heart failure, obstructive sleep apnea, COPD, compensated cirrhosis, hypertension, coronary artery disease, non-insulin-dependent diabetes mellitus, and peripheral vascular disease. She is followed by Dr. Nanetta Batty and Dr. Fleet Contras.  I sent her for a CT scan which shows a ventral incisional hernia in the mid upper abdomen contained fat and a little of the transverse colon. There was no inflammation. There was no obstruction. There was no ascites. There were no liver lesions. There were multiple renal lesions. She has subsequently  seen Dr. Su Grand.   A MRI showed no worrisome renal lesions. Changes of chronic pancreatitis are noted.  She is scheduled to followup with Dr. Brunilda Payor on July 11.  She is scheduled for followup with Dr. Nanetta Batty in August.  She states that the abdominal hernia still hurts her. She states that Dr. Allyson Sabal told her to consider nonoperative management. She states that she is having too much pain and will need to consider surgical intervention. HPI  Past Medical History  Diagnosis Date  . Alcoholic cirrhosis   . Reflux esophagitis   . Gastroesophageal reflux   . Colon polyps   . CHF (congestive heart failure)   . Diabetes mellitus   . Hypertension   . History of GI diverticular bleed   . Pneumonia     history of  . Asthma   . Poor circulation   . Anemia   . Blood transfusion   . Sinus problem   . Wears glasses   . Villous adenoma of colon 05/10/11  . PVD (peripheral vascular disease)   . Sleep apnea   .  Chronic obstructive pulmonary disease (COPD)   . Cirrhosis   . Anxiety   . Arthritis   . Depression   . Glaucoma   . Gout   . Acute venous embolism and thrombosis of deep vessels of proximal lower extremity   . Bruises easily   . Back pain   . Leg swelling   . Abdominal pain   . Shortness of breath   . Cough   . Acquired cyst of kidney     Past Surgical History  Procedure Date  . Abdominal hysterectomy   . Cataract extraction   . Colonoscopy   . Laparoscopic assissted total colectomy w/ j-pouch 04/22/11    Family History  Problem Relation Age of Onset  . Cancer Brother     heent ca  . Cancer Sister     liver ca    Social History History  Substance Use Topics  . Smoking status: Former Smoker    Quit date: 12/17/2010  . Smokeless tobacco: Not on file  . Alcohol Use: No     stopped 2000    Allergies  Allergen Reactions  . Penicillins Hives    Current Outpatient Prescriptions  Medication Sig Dispense Refill  . albuterol (PROVENTIL HFA;VENTOLIN HFA) 108 (90 BASE) MCG/ACT inhaler Inhale 2 puffs into the lungs every 6 (six) hours as needed.      Marland Kitchen aspirin 81 MG EC tablet Take 81 mg by mouth daily.        Marland Kitchen  chlorhexidine (PERIDEX) 0.12 % solution Use as directed 15 mLs in the mouth or throat 2 (two) times daily.      . Cholecalciferol (VITAMIN D-3) 5000 UNITS TABS Take by mouth.      . cilostazol (PLETAL) 100 MG tablet Take 100 mg by mouth 2 (two) times daily before a meal.       . colchicine (COLCRYS) 0.6 MG tablet Take 0.6 mg by mouth daily.      . diclofenac (VOLTAREN) 0.1 % ophthalmic solution 1 drop 4 (four) times daily. Clarify if opthalmic       . diphenhydrAMINE (BENADRYL) 25 MG tablet Take 25 mg by mouth every 6 (six) hours as needed.      . fluticasone (FLONASE) 50 MCG/ACT nasal spray Place 2 sprays into the nose daily. 50 mcg /act 2 sprays nasal once a day       . folic acid (FOLVITE) 1 MG tablet Take 1 mg by mouth daily.      Marland Kitchen glimepiride (AMARYL) 2 MG  tablet Take 4 mg by mouth daily before breakfast.       . glucose blood test strip 1 each by Other route as needed. Use as instructed      . Hydrocodone-Acetaminophen 5-300 MG TABS Take by mouth as needed.      . insulin glargine (LANTUS SOLOSTAR) 100 UNIT/ML injection Inject into the skin at bedtime.      . lidocaine (LIDODERM) 5 % Place 1 patch onto the skin daily. Remove & Discard patch within 12 hours or as directed by MD       . methocarbamol (ROBAXIN) 500 MG tablet Take 500 mg by mouth 3 (three) times daily.      . metoprolol succinate (TOPROL-XL) 25 MG 24 hr tablet Take 50 mg by mouth daily.       . montelukast (SINGULAIR) 10 MG tablet Take 10 mg by mouth at bedtime.        Marland Kitchen omeprazole (PRILOSEC) 40 MG capsule Take 40 mg by mouth daily.        Marland Kitchen senna (SENOKOT) 8.6 MG tablet Take 1 tablet by mouth daily.        . sitaGLIPtan (JANUVIA) 50 MG tablet Take 50 mg by mouth daily.        Marland Kitchen spironolactone (ALDACTONE) 25 MG tablet Take 25 mg by mouth daily.      . temazepam (RESTORIL) 30 MG capsule Take 30 mg by mouth at bedtime as needed.      . triamcinolone cream (KENALOG) 0.5 % Apply 1 application topically as needed.        Review of Systems Review of Systems  Constitutional: Negative for fever, chills and unexpected weight change.  HENT: Negative for hearing loss, congestion, sore throat, trouble swallowing and voice change.   Eyes: Negative for visual disturbance.  Respiratory: Negative for cough and wheezing.   Cardiovascular: Negative for chest pain, palpitations and leg swelling.  Gastrointestinal: Positive for abdominal pain. Negative for nausea, vomiting, diarrhea, constipation, blood in stool, abdominal distention and anal bleeding.  Genitourinary: Negative for hematuria, vaginal bleeding and difficulty urinating.  Musculoskeletal: Negative for arthralgias.  Skin: Negative for rash and wound.  Neurological: Negative for seizures, syncope and headaches.  Hematological: Negative  for adenopathy. Does not bruise/bleed easily.  Psychiatric/Behavioral: Negative for confusion.    Blood pressure 142/80, pulse 84, temperature 98.2 F (36.8 C), temperature source Temporal, resp. rate 18, height 5' (1.524 m), weight 155 lb 12.8 oz (70.67 kg).  Physical Exam Physical Exam  Constitutional: She is oriented to person, place, and time. She appears well-developed and well-nourished. No distress.  HENT:  Head: Normocephalic and atraumatic.  Nose: Nose normal.  Mouth/Throat: No oropharyngeal exudate.  Eyes: Conjunctivae and EOM are normal. Pupils are equal, round, and reactive to light. Left eye exhibits no discharge. No scleral icterus.  Neck: Neck supple. No JVD present. No tracheal deviation present. No thyromegaly present.  Cardiovascular: Normal rate, regular rhythm, normal heart sounds and intact distal pulses.   No murmur heard. Pulmonary/Chest: Effort normal and breath sounds normal. No respiratory distress. She has no wheezes. She has no rales. She exhibits no tenderness.       Distant breathsounds. No wheezing   Abdominal: Soft. Bowel sounds are normal. She exhibits no distension and no mass. There is no tenderness. There is no rebound and no guarding.       Protuberant abdomen. Reducible the hernia in the midline. This is at least 6 cm transversely. This completely reduces when supine. I can hold this reduced adenopathy or any other defects in the midline scar, although her abdominal wall muscles are very thin. There is no fluid wave. No other masses.  Musculoskeletal: She exhibits no edema and no tenderness.  Lymphadenopathy:    She has no cervical adenopathy.  Neurological: She is alert and oriented to person, place, and time. She exhibits normal muscle tone. Coordination normal.  Skin: Skin is warm. No rash noted. She is not diaphoretic. No erythema. No pallor.  Psychiatric: She has a normal mood and affect. Her behavior is normal. Judgment and thought content  normal.    Data Reviewed CT.  MRI.  Dr. Madilyn Hook initial note  Assessment    Complex ventral incisional hernia, central, periumbilical. Defect at least 6 cm. This may be amenable to laparoscopic repair but she would be at high risk for converting to an open operation.  Multiple renal lesions which looked benign by MRI. Followed by Dr. Ezzie Dural.  Significant cardiovascular disease, scheduled to see Dr. Nanetta Batty in August 2013.  Significant pulmonary disease.  Obesity  Compensated cirrhosis  Hypertension  Non insulin dependent  diabetes mellitus.    Plan    We had a long talk about her ventral hernia. I talked about medical management with a Velcro abdominal binder. We talked about surgical repair with mesh, both laparoscopic and open techniques. We talked about the numerous risks of this surgery including recurrence of the hernia, injury to the intestine, exacerbation of cardiac or pulmonary disease, infection, bleeding. She knows there are numerous other risks.  At this point in time she states that she is inclined to have something done because of her discomfort.  After lengthy decision decision-making and talking she decided she wanted to wait until she saw her cardiologist to see what her cardiovascular risk of general anesthesia might be.  She will call me back after she sees Dr. Allyson Sabal for cardiac clearance  She will followup with Dr. Su Grand July 11 regarding her renal lesions.       [name]. Derrell Lolling, M.D., [facility] General and Minimally invasive Surgery Breast and Colorectal Surgery Office:   [phone] Pager:   [phone]  03/06/2012, 2:53 PM

## 2012-05-03 ENCOUNTER — Telehealth (INDEPENDENT_AMBULATORY_CARE_PROVIDER_SITE_OTHER): Payer: Self-pay | Admitting: General Surgery

## 2012-05-03 NOTE — Telephone Encounter (Signed)
Called patient to advise her that information from her visit with Dr. Derrell Lolling has been sent to her cardiologist, Dr. Allyson Sabal at [facility]. Advised that as soon as a response has been received she will be contacted by the surgery schedulers. Patient agreed.

## 2012-05-09 ENCOUNTER — Encounter (INDEPENDENT_AMBULATORY_CARE_PROVIDER_SITE_OTHER): Payer: Self-pay | Admitting: General Surgery

## 2012-05-09 NOTE — Progress Notes (Signed)
Received cardiac clearance from Dr. Hazle Coca office approving patient for surgery. Signed surgical orders taken to schedulers with copy of authorization. Original authorization and office note sent to medical records to be scanned into the chart.

## 2012-05-11 ENCOUNTER — Encounter (INDEPENDENT_AMBULATORY_CARE_PROVIDER_SITE_OTHER): Payer: Self-pay

## 2012-05-14 ENCOUNTER — Other Ambulatory Visit (INDEPENDENT_AMBULATORY_CARE_PROVIDER_SITE_OTHER): Payer: Self-pay | Admitting: General Surgery

## 2012-05-29 DIAGNOSIS — I509 Heart failure, unspecified: Secondary | ICD-10-CM

## 2012-05-29 HISTORY — DX: Heart failure, unspecified: I50.9

## 2012-05-30 ENCOUNTER — Encounter (HOSPITAL_COMMUNITY): Payer: Self-pay | Admitting: Pharmacy Technician

## 2012-06-05 ENCOUNTER — Encounter (HOSPITAL_COMMUNITY)
Admission: RE | Admit: 2012-06-05 | Discharge: 2012-06-05 | Disposition: A | Discharge status: Home / Self Care | Payer: PRIVATE HEALTH INSURANCE | Source: Ambulatory Visit | Attending: General Surgery | Admitting: General Surgery

## 2012-06-05 ENCOUNTER — Encounter (HOSPITAL_COMMUNITY): Payer: Self-pay

## 2012-06-05 HISTORY — DX: Inflammatory liver disease, unspecified: K75.9

## 2012-06-05 LAB — COMPREHENSIVE METABOLIC PANEL
ALT: 14 U/L (ref 0–35)
Alkaline Phosphatase: 107 U/L (ref 39–117)
BUN: 24 mg/dL — ABNORMAL HIGH (ref 6–23)
CO2: 28 mEq/L (ref 19–32)
Calcium: 9.7 mg/dL (ref 8.4–10.5)
GFR calc Af Amer: 40 mL/min — ABNORMAL LOW (ref >90)
GFR calc non Af Amer: 35 mL/min — ABNORMAL LOW (ref >90)
Glucose, Bld: 234 mg/dL — ABNORMAL HIGH (ref 70–99)
Potassium: 4.6 mEq/L (ref 3.5–5.1)
Sodium: 139 mEq/L (ref 135–145)

## 2012-06-05 LAB — CBC WITH DIFFERENTIAL/PLATELET
Basophils Relative: 0 % (ref 0–1)
Eosinophils Relative: 1 % (ref 0–5)
HCT: 39.9 % (ref 36.0–46.0)
Hemoglobin: 12.5 g/dL (ref 12.0–15.0)
MCH: 25.2 pg — ABNORMAL LOW (ref 26.0–34.0)
Monocytes Absolute: 0.9 10*3/uL (ref 0.1–1.0)
Neutro Abs: 7.3 10*3/uL (ref 1.7–7.7)
Neutrophils Relative %: 63 % (ref 43–77)
RBC: 4.96 MIL/uL (ref 3.87–5.11)

## 2012-06-05 LAB — URINALYSIS, ROUTINE W REFLEX MICROSCOPIC
Bilirubin Urine: NEGATIVE
Ketones, ur: NEGATIVE mg/dL
Nitrite: NEGATIVE
Protein, ur: 30 mg/dL — AB
Urobilinogen, UA: 0.2 mg/dL (ref 0.0–1.0)

## 2012-06-05 LAB — SURGICAL PCR SCREEN
MRSA, PCR: NEGATIVE
Staphylococcus aureus: NEGATIVE

## 2012-06-05 MED ORDER — CHLORHEXIDINE GLUCONATE 4 % EX LIQD: 1.0000 "application " | Freq: Once | CUTANEOUS | Status: DC

## 2012-06-05 MED ORDER — DEXTROSE 5 % IV SOLN: 3.0000 g | INTRAVENOUS | Status: DC

## 2012-06-05 MED ORDER — HEPARIN SODIUM (PORCINE) 5000 UNIT/ML IJ SOLN: 5000.0000 [IU] | Freq: Once | INTRAMUSCULAR | Status: DC

## 2012-06-05 NOTE — Pre-Procedure Instructions (Signed)
20 [name]  06/05/2012   Your procedure is scheduled on:  Wednesday, October 16th  Report to [facility] at 0630 AM.  Call this number if you have problems the morning of surgery: [phone]   Remember:   Do not eat food or drink:After Midnight.  Take these medicines the morning of surgery with A SIP OF WATER: lopressor, prilosec, colchicine, flonase, albuterol if needed, vicodin if needed   Do not wear jewelry, make-up or nail polish.  Do not wear lotions, powders, or perfumes.   Do not shave 48 hours prior to surgery. Men may shave face and neck.  Do not bring valuables to the hospital.  Contacts, dentures or bridgework may not be worn into surgery.  Leave suitcase in the car. After surgery it may be brought to your room.  For patients admitted to the hospital, checkout time is 11:00 AM the day of discharge.   Patients discharged the day of surgery will not be allowed to drive home.  Special Instructions: Shower using CHG 2 nights before surgery and the night before surgery.  If you shower the day of surgery use CHG.  Use special wash - you have one bottle of CHG for all showers.  You should use approximately 1/3 of the bottle for each shower.   Please read over the following fact sheets that you were given: Pain Booklet, Coughing and Deep Breathing, MRSA Information and Surgical Site Infection Prevention

## 2012-06-12 NOTE — H&P (Signed)
[name]    MRN: [id number]   Description: 66 year old female  Provider: Ernestene Mention, MD  Department: [facility]       Diagnoses     Incisional hernia   - Primary    553.21        Vitals   BP Pulse Temp Resp Ht Wt    142/80 84 98.2 F (36.8 C) (Temporal) 18 5' (1.524 m) 155 lb 12.8 oz (70.67 kg)    BMI - 30.43 kg/m2                 History and Physical   Ernestene Mention, MD   Patient ID: [name], female   DOB: 1946/01/23, 66 y.o.   MRN: [id number]             HPI [name] is a 66 y.o. female.  She returns for further discussion regarding management of her ventral incisional hernia.  Recall this patient underwent laparoscopic-assisted right colectomy by me on April 22, 2011. She had a villous adenoma.  Her comorbidities include obesity, congestive heart failure, obstructive sleep apnea, COPD, compensated cirrhosis, hypertension, coronary artery disease, non-insulin-dependent diabetes mellitus, and peripheral vascular disease. She is followed by Dr. Nanetta Batty and Dr. Fleet Contras.  I sent her for a CT scan which shows a ventral incisional hernia in the mid upper abdomen contained fat and a little of the transverse colon. There was no inflammation. There was no obstruction. There was no ascites. There were no liver lesions. There were multiple renal lesions. She has subsequently  seen Dr. Su Grand.   A MRI showed no worrisome renal lesions. Changes of chronic pancreatitis are noted.  She is scheduled to followup with Dr. Brunilda Payor on July 11.  She is scheduled for followup with Dr. Nanetta Batty in August.  She states that the abdominal hernia still hurts her. She states that Dr. Allyson Sabal told her to consider nonoperative management. She states that she is having too much pain and will need to consider surgical intervention.     Past Medical History   Diagnosis  Date   .  Alcoholic cirrhosis     .  Reflux esophagitis     .   Gastroesophageal reflux     .  Colon polyps     .  CHF (congestive heart failure)     .  Diabetes mellitus     .  Hypertension     .  History of GI diverticular bleed     .  Pneumonia         history of   .  Asthma     .  Poor circulation     .  Anemia     .  Blood transfusion     .  Sinus problem     .  Wears glasses     .  Villous adenoma of colon  05/10/11   .  PVD (peripheral vascular disease)     .  Sleep apnea     .  Chronic obstructive pulmonary disease (COPD)     .  Cirrhosis     .  Anxiety     .  Arthritis     .  Depression     .  Glaucoma     .  Gout     .  Acute venous embolism and thrombosis of deep vessels of proximal lower extremity     .  Bruises easily     .  Back pain     .  Leg swelling     .  Abdominal pain     .  Shortness of breath     .  Cough     .  Acquired cyst of kidney         Past Surgical History   Procedure  Date   .  Abdominal hysterectomy     .  Cataract extraction     .  Colonoscopy     .  Laparoscopic assissted total colectomy w/ j-pouch  04/22/11       Family History   Problem  Relation  Age of Onset   .  Cancer  Brother         heent ca   .  Cancer  Sister         liver ca      Social History History   Substance Use Topics   .  Smoking status:  Former Smoker       Quit date:  12/17/2010   .  Smokeless tobacco:  Not on file   .  Alcohol Use:  No         stopped 2000       Allergies   Allergen  Reactions   .  Penicillins  Hives       Current Outpatient Prescriptions   Medication  Sig  Dispense  Refill   .  albuterol (PROVENTIL HFA;VENTOLIN HFA) 108 (90 BASE) MCG/ACT inhaler  Inhale 2 puffs into the lungs every 6 (six) hours as needed.         Marland Kitchen  aspirin 81 MG EC tablet  Take 81 mg by mouth daily.           .  chlorhexidine (PERIDEX) 0.12 % solution  Use as directed 15 mLs in the mouth or throat 2 (two) times daily.         .  Cholecalciferol (VITAMIN D-3) 5000 UNITS TABS  Take by mouth.         .  cilostazol  (PLETAL) 100 MG tablet  Take 100 mg by mouth 2 (two) times daily before a meal.          .  colchicine (COLCRYS) 0.6 MG tablet  Take 0.6 mg by mouth daily.         .  diclofenac (VOLTAREN) 0.1 % ophthalmic solution  1 drop 4 (four) times daily. Clarify if opthalmic          .  diphenhydrAMINE (BENADRYL) 25 MG tablet  Take 25 mg by mouth every 6 (six) hours as needed.         .  fluticasone (FLONASE) 50 MCG/ACT nasal spray  Place 2 sprays into the nose daily. 50 mcg /act 2 sprays nasal once a day          .  folic acid (FOLVITE) 1 MG tablet  Take 1 mg by mouth daily.         Marland Kitchen  glimepiride (AMARYL) 2 MG tablet  Take 4 mg by mouth daily before breakfast.          .  glucose blood test strip  1 each by Other route as needed. Use as instructed         .  Hydrocodone-Acetaminophen 5-300 MG TABS  Take by mouth as needed.         .  insulin glargine (LANTUS SOLOSTAR) 100 UNIT/ML  injection  Inject into the skin at bedtime.         .  lidocaine (LIDODERM) 5 %  Place 1 patch onto the skin daily. Remove & Discard patch within 12 hours or as directed by MD          .  methocarbamol (ROBAXIN) 500 MG tablet  Take 500 mg by mouth 3 (three) times daily.         .  metoprolol succinate (TOPROL-XL) 25 MG 24 hr tablet  Take 50 mg by mouth daily.          .  montelukast (SINGULAIR) 10 MG tablet  Take 10 mg by mouth at bedtime.           Marland Kitchen  omeprazole (PRILOSEC) 40 MG capsule  Take 40 mg by mouth daily.           Marland Kitchen  senna (SENOKOT) 8.6 MG tablet  Take 1 tablet by mouth daily.           .  sitaGLIPtan (JANUVIA) 50 MG tablet  Take 50 mg by mouth daily.           Marland Kitchen  spironolactone (ALDACTONE) 25 MG tablet  Take 25 mg by mouth daily.         .  temazepam (RESTORIL) 30 MG capsule  Take 30 mg by mouth at bedtime as needed.         .  triamcinolone cream (KENALOG) 0.5 %  Apply 1 application topically as needed.            ROS:   Constitutional: Negative for fever, chills and unexpected weight change.  HENT: Negative  for hearing loss, congestion, sore throat, trouble swallowing and voice change.   Eyes: Negative for visual disturbance.  Respiratory: Negative for cough and wheezing.   Cardiovascular: Negative for chest pain, palpitations and leg swelling.  Gastrointestinal: Positive for abdominal pain. Negative for nausea, vomiting, diarrhea, constipation, blood in stool, abdominal distention and anal bleeding.  Genitourinary: Negative for hematuria, vaginal bleeding and difficulty urinating.  Musculoskeletal: Negative for arthralgias.  Skin: Negative for rash and wound.  Neurological: Negative for seizures, syncope and headaches.  Hematological: Negative for adenopathy. Does not bruise/bleed easily.  Psychiatric/Behavioral: Negative for confusion.    Blood pressure 142/80, pulse 84, temperature 98.2 F (36.8 C), temperature source Temporal, resp. rate 18, height 5' (1.524 m), weight 155 lb 12.8 oz (70.67 kg).   Physical Exam   Constitutional: She is oriented to person, place, and time. She appears well-developed and well-nourished. No distress.  HENT:   Head: Normocephalic and atraumatic.   Nose: Nose normal.   Mouth/Throat: No oropharyngeal exudate.  Eyes: Conjunctivae and EOM are normal. Pupils are equal, round, and reactive to light. Left eye exhibits no discharge. No scleral icterus.  Neck: Neck supple. No JVD present. No tracheal deviation present. No thyromegaly present.  Cardiovascular: Normal rate, regular rhythm, normal heart sounds and intact distal pulses.    No murmur heard. Pulmonary/Chest: Effort normal and breath sounds normal. No respiratory distress. She has no wheezes. She has no rales. She exhibits no tenderness.       Distant breathsounds. No wheezing   Abdominal: Soft. Bowel sounds are normal. She exhibits no distension and no mass. There is no tenderness. There is no rebound and no guarding.       Protuberant abdomen. Reducible the hernia in the midline. This is at least 6 cm  transversely. This completely reduces when supine. I  can hold this reduced adenopathy or any other defects in the midline scar, although her abdominal wall muscles are very thin. There is no fluid wave. No other masses.  Musculoskeletal: She exhibits no edema and no tenderness.  Lymphadenopathy:    She has no cervical adenopathy.  Neurological: She is alert and oriented to person, place, and time. She exhibits normal muscle tone. Coordination normal.  Skin: Skin is warm. No rash noted. She is not diaphoretic. No erythema. No pallor.  Psychiatric: She has a normal mood and affect. Her behavior is normal. Judgment and thought content normal.    Data Reviewed CT.  MRI.  Dr. Madilyn Hook initial note   Assessment Complex ventral incisional hernia, central, periumbilical. Defect at least 6 cm. This may be amenable to laparoscopic repair but she would be at high risk for converting to an open operation.   Multiple renal lesions which looked benign by MRI. Followed by Dr. Ezzie Dural.   Significant cardiovascular disease, scheduled to see Dr. Nanetta Batty in August 2013.   Significant pulmonary disease.   Obesity   Compensated cirrhosis   Hypertension   Non insulin dependent  diabetes mellitus.   Plan We had a long talk about her ventral hernia. I talked about medical management with a Velcro abdominal binder. We talked about surgical repair with mesh, both laparoscopic and open techniques. We talked about the numerous risks of this surgery including recurrence of the hernia, injury to the intestine, exacerbation of cardiac or pulmonary disease, infection, bleeding. She knows there are numerous other risks.   At this point in time she states that she is inclined to have something done because of her discomfort.   After lengthy decision decision-making and talking she decided she wanted to wait until she saw her cardiologist to see what her cardiovascular risk of general anesthesia  might be.   She will call me back after she sees Dr. Allyson Sabal for cardiac clearance   She will followup with Dr. Su Grand July 11 regarding her renal lesions.       [name]. Derrell Lolling, M.D., [facility] General and Minimally invasive Surgery Breast and Colorectal Surgery Office:   [phone] Pager:   [phone]

## 2012-06-13 ENCOUNTER — Inpatient Hospital Stay (HOSPITAL_COMMUNITY)
Admission: RE | Admit: 2012-06-13 | Discharge: 2012-06-21 | DRG: 335 | Disposition: A | Discharge status: Home / Self Care | Payer: PRIVATE HEALTH INSURANCE | Source: Ambulatory Visit | Attending: General Surgery | Admitting: General Surgery

## 2012-06-13 ENCOUNTER — Encounter (HOSPITAL_COMMUNITY): Payer: Self-pay | Admitting: *Deleted

## 2012-06-13 ENCOUNTER — Encounter (HOSPITAL_COMMUNITY): Payer: Self-pay | Admitting: Anesthesiology

## 2012-06-13 ENCOUNTER — Ambulatory Visit (HOSPITAL_COMMUNITY): Payer: PRIVATE HEALTH INSURANCE | Admitting: Anesthesiology

## 2012-06-13 ENCOUNTER — Encounter (HOSPITAL_COMMUNITY)
Admission: RE | Disposition: A | Discharge status: Home / Self Care | Payer: Self-pay | Source: Ambulatory Visit | Attending: General Surgery

## 2012-06-13 DIAGNOSIS — I509 Heart failure, unspecified: Secondary | ICD-10-CM | POA: Diagnosis present

## 2012-06-13 DIAGNOSIS — K430 Incisional hernia with obstruction, without gangrene: Secondary | ICD-10-CM

## 2012-06-13 DIAGNOSIS — G4733 Obstructive sleep apnea (adult) (pediatric): Secondary | ICD-10-CM | POA: Diagnosis present

## 2012-06-13 DIAGNOSIS — I739 Peripheral vascular disease, unspecified: Secondary | ICD-10-CM | POA: Diagnosis present

## 2012-06-13 DIAGNOSIS — R079 Chest pain, unspecified: Secondary | ICD-10-CM

## 2012-06-13 DIAGNOSIS — D649 Anemia, unspecified: Secondary | ICD-10-CM

## 2012-06-13 DIAGNOSIS — N183 Chronic kidney disease, stage 3 unspecified: Secondary | ICD-10-CM | POA: Diagnosis present

## 2012-06-13 DIAGNOSIS — J4489 Other specified chronic obstructive pulmonary disease: Secondary | ICD-10-CM | POA: Diagnosis present

## 2012-06-13 DIAGNOSIS — I5033 Acute on chronic diastolic (congestive) heart failure: Secondary | ICD-10-CM

## 2012-06-13 DIAGNOSIS — R0789 Other chest pain: Secondary | ICD-10-CM | POA: Diagnosis not present

## 2012-06-13 DIAGNOSIS — I5032 Chronic diastolic (congestive) heart failure: Secondary | ICD-10-CM

## 2012-06-13 DIAGNOSIS — N179 Acute kidney failure, unspecified: Secondary | ICD-10-CM | POA: Diagnosis present

## 2012-06-13 DIAGNOSIS — IMO0001 Reserved for inherently not codable concepts without codable children: Secondary | ICD-10-CM | POA: Diagnosis present

## 2012-06-13 DIAGNOSIS — E118 Type 2 diabetes mellitus with unspecified complications: Secondary | ICD-10-CM

## 2012-06-13 DIAGNOSIS — E669 Obesity, unspecified: Secondary | ICD-10-CM | POA: Diagnosis present

## 2012-06-13 DIAGNOSIS — I472 Ventricular tachycardia, unspecified: Secondary | ICD-10-CM | POA: Diagnosis present

## 2012-06-13 DIAGNOSIS — J9819 Other pulmonary collapse: Secondary | ICD-10-CM | POA: Diagnosis not present

## 2012-06-13 DIAGNOSIS — K7030 Alcoholic cirrhosis of liver without ascites: Secondary | ICD-10-CM | POA: Diagnosis present

## 2012-06-13 DIAGNOSIS — Z794 Long term (current) use of insulin: Secondary | ICD-10-CM

## 2012-06-13 DIAGNOSIS — I4729 Other ventricular tachycardia: Secondary | ICD-10-CM | POA: Diagnosis present

## 2012-06-13 DIAGNOSIS — K432 Incisional hernia without obstruction or gangrene: Secondary | ICD-10-CM | POA: Diagnosis present

## 2012-06-13 DIAGNOSIS — Z7982 Long term (current) use of aspirin: Secondary | ICD-10-CM

## 2012-06-13 DIAGNOSIS — J449 Chronic obstructive pulmonary disease, unspecified: Secondary | ICD-10-CM | POA: Diagnosis present

## 2012-06-13 DIAGNOSIS — J441 Chronic obstructive pulmonary disease with (acute) exacerbation: Secondary | ICD-10-CM | POA: Diagnosis present

## 2012-06-13 DIAGNOSIS — Z23 Encounter for immunization: Secondary | ICD-10-CM

## 2012-06-13 DIAGNOSIS — I2510 Atherosclerotic heart disease of native coronary artery without angina pectoris: Secondary | ICD-10-CM | POA: Diagnosis present

## 2012-06-13 DIAGNOSIS — I129 Hypertensive chronic kidney disease with stage 1 through stage 4 chronic kidney disease, or unspecified chronic kidney disease: Secondary | ICD-10-CM | POA: Diagnosis present

## 2012-06-13 DIAGNOSIS — K861 Other chronic pancreatitis: Secondary | ICD-10-CM | POA: Diagnosis present

## 2012-06-13 DIAGNOSIS — Z87891 Personal history of nicotine dependence: Secondary | ICD-10-CM

## 2012-06-13 DIAGNOSIS — R5381 Other malaise: Secondary | ICD-10-CM | POA: Diagnosis present

## 2012-06-13 DIAGNOSIS — G4730 Sleep apnea, unspecified: Secondary | ICD-10-CM | POA: Diagnosis present

## 2012-06-13 DIAGNOSIS — K660 Peritoneal adhesions (postprocedural) (postinfection): Secondary | ICD-10-CM

## 2012-06-13 DIAGNOSIS — F1020 Alcohol dependence, uncomplicated: Secondary | ICD-10-CM | POA: Diagnosis present

## 2012-06-13 DIAGNOSIS — K560 Paralytic ileus: Secondary | ICD-10-CM | POA: Diagnosis not present

## 2012-06-13 HISTORY — PX: VENTRAL HERNIA REPAIR: SHX424

## 2012-06-13 LAB — CBC
Hemoglobin: 11.5 g/dL — ABNORMAL LOW (ref 12.0–15.0)
MCH: 25.3 pg — ABNORMAL LOW (ref 26.0–34.0)
RBC: 4.54 MIL/uL (ref 3.87–5.11)

## 2012-06-13 LAB — GLUCOSE, CAPILLARY

## 2012-06-13 LAB — CREATININE, SERUM
Creatinine, Ser: 1.29 mg/dL — ABNORMAL HIGH (ref 0.50–1.10)
GFR calc Af Amer: 49 mL/min — ABNORMAL LOW (ref >90)
GFR calc non Af Amer: 42 mL/min — ABNORMAL LOW (ref >90)

## 2012-06-13 SURGERY — REPAIR, HERNIA, VENTRAL, LAPAROSCOPIC
Anesthesia: General | Site: Abdomen | Wound class: Clean

## 2012-06-13 MED ORDER — HYDROMORPHONE HCL PF 1 MG/ML IJ SOLN
INTRAMUSCULAR | Status: AC
  Filled 2012-06-13: qty 1

## 2012-06-13 MED ORDER — METOPROLOL TARTRATE 50 MG PO TABS
50.0000 mg | ORAL_TABLET | Freq: Two times a day (BID) | ORAL | Status: DC
  Administered 2012-06-13 – 2012-06-18 (×11): 50 mg via ORAL
  Filled 2012-06-13 (×13): qty 1

## 2012-06-13 MED ORDER — CEFAZOLIN SODIUM-DEXTROSE 2-3 GM-% IV SOLR
2.0000 g | Freq: Once | INTRAVENOUS | Status: AC
  Administered 2012-06-13: 2 g via INTRAVENOUS

## 2012-06-13 MED ORDER — MORPHINE SULFATE 2 MG/ML IJ SOLN
2.0000 mg | INTRAMUSCULAR | Status: DC | PRN
  Administered 2012-06-13 – 2012-06-14 (×4): 2 mg via INTRAVENOUS
  Filled 2012-06-13 (×4): qty 1

## 2012-06-13 MED ORDER — NEOSTIGMINE METHYLSULFATE 1 MG/ML IJ SOLN
INTRAMUSCULAR | Status: DC | PRN
  Administered 2012-06-13: 3 mg via INTRAVENOUS

## 2012-06-13 MED ORDER — POTASSIUM CHLORIDE 2 MEQ/ML IV SOLN
INTRAVENOUS | Status: DC
  Administered 2012-06-13 – 2012-06-14 (×2): via INTRAVENOUS
  Filled 2012-06-13 (×4): qty 1000

## 2012-06-13 MED ORDER — ACETAMINOPHEN 10 MG/ML IV SOLN: 1000.0000 mg | Freq: Once | INTRAVENOUS | Status: DC | PRN

## 2012-06-13 MED ORDER — HYDROCODONE-ACETAMINOPHEN 5-325 MG PO TABS
1.0000 | ORAL_TABLET | ORAL | Status: DC | PRN
  Administered 2012-06-15: 1 via ORAL
  Administered 2012-06-15: 2 via ORAL
  Administered 2012-06-15: 1 via ORAL
  Administered 2012-06-16 (×3): 2 via ORAL
  Filled 2012-06-13: qty 2
  Filled 2012-06-13 (×2): qty 1
  Filled 2012-06-13 (×3): qty 2
  Filled 2012-06-13: qty 1
  Filled 2012-06-13: qty 2

## 2012-06-13 MED ORDER — BUPIVACAINE-EPINEPHRINE 0.25% -1:200000 IJ SOLN
INTRAMUSCULAR | Status: DC | PRN
  Administered 2012-06-13: 8 mL

## 2012-06-13 MED ORDER — ARTIFICIAL TEARS OP OINT
TOPICAL_OINTMENT | OPHTHALMIC | Status: DC | PRN
  Administered 2012-06-13: 1 via OPHTHALMIC

## 2012-06-13 MED ORDER — MIDAZOLAM HCL 5 MG/5ML IJ SOLN
INTRAMUSCULAR | Status: DC | PRN
  Administered 2012-06-13: 1 mg via INTRAVENOUS

## 2012-06-13 MED ORDER — INSULIN GLARGINE 100 UNIT/ML ~~LOC~~ SOLN
8.0000 [IU] | Freq: Every day | SUBCUTANEOUS | Status: DC
  Administered 2012-06-13 – 2012-06-16 (×4): 8 [IU] via SUBCUTANEOUS

## 2012-06-13 MED ORDER — ONDANSETRON HCL 4 MG PO TABS: 4.0000 mg | ORAL_TABLET | Freq: Four times a day (QID) | ORAL | Status: DC | PRN

## 2012-06-13 MED ORDER — LIDOCAINE HCL (CARDIAC) 20 MG/ML IV SOLN
INTRAVENOUS | Status: DC | PRN
  Administered 2012-06-13: 30 mg via INTRAVENOUS

## 2012-06-13 MED ORDER — TEMAZEPAM 15 MG PO CAPS: 30.0000 mg | ORAL_CAPSULE | Freq: Every evening | ORAL | Status: DC | PRN

## 2012-06-13 MED ORDER — TRIAMCINOLONE ACETONIDE 0.5 % EX CREA
1.0000 "application " | TOPICAL_CREAM | Freq: Every day | CUTANEOUS | Status: DC | PRN
  Filled 2012-06-13: qty 15

## 2012-06-13 MED ORDER — GLIMEPIRIDE 4 MG PO TABS
4.0000 mg | ORAL_TABLET | Freq: Every day | ORAL | Status: DC
  Administered 2012-06-14 – 2012-06-16 (×3): 4 mg via ORAL
  Filled 2012-06-13 (×5): qty 1

## 2012-06-13 MED ORDER — CEFAZOLIN SODIUM-DEXTROSE 2-3 GM-% IV SOLR
2.0000 g | Freq: Three times a day (TID) | INTRAVENOUS | Status: AC
  Administered 2012-06-13 – 2012-06-14 (×3): 2 g via INTRAVENOUS
  Filled 2012-06-13 (×3): qty 50

## 2012-06-13 MED ORDER — DIPHENHYDRAMINE HCL 25 MG PO CAPS: 25.0000 mg | ORAL_CAPSULE | Freq: Every evening | ORAL | Status: DC | PRN

## 2012-06-13 MED ORDER — HEPARIN SODIUM (PORCINE) 5000 UNIT/ML IJ SOLN
5000.0000 [IU] | Freq: Three times a day (TID) | INTRAMUSCULAR | Status: DC
  Administered 2012-06-14 – 2012-06-21 (×23): 5000 [IU] via SUBCUTANEOUS
  Filled 2012-06-13 (×25): qty 1

## 2012-06-13 MED ORDER — INFLUENZA VIRUS VACC SPLIT PF IM SUSP
0.5000 mL | INTRAMUSCULAR | Status: DC
  Filled 2012-06-13: qty 0.5

## 2012-06-13 MED ORDER — ALBUTEROL SULFATE HFA 108 (90 BASE) MCG/ACT IN AERS
INHALATION_SPRAY | RESPIRATORY_TRACT | Status: DC | PRN
  Administered 2012-06-13 (×2): 2 via RESPIRATORY_TRACT

## 2012-06-13 MED ORDER — ONDANSETRON HCL 4 MG/2ML IJ SOLN
INTRAMUSCULAR | Status: DC | PRN
  Administered 2012-06-13: 4 mg via INTRAVENOUS

## 2012-06-13 MED ORDER — ONDANSETRON HCL 4 MG/2ML IJ SOLN
4.0000 mg | Freq: Four times a day (QID) | INTRAMUSCULAR | Status: DC | PRN
  Administered 2012-06-13 – 2012-06-14 (×2): 4 mg via INTRAVENOUS
  Filled 2012-06-13 (×2): qty 2

## 2012-06-13 MED ORDER — ROCURONIUM BROMIDE 100 MG/10ML IV SOLN
INTRAVENOUS | Status: DC | PRN
  Administered 2012-06-13 (×3): 10 mg via INTRAVENOUS
  Administered 2012-06-13: 40 mg via INTRAVENOUS
  Administered 2012-06-13: 10 mg via INTRAVENOUS

## 2012-06-13 MED ORDER — MONTELUKAST SODIUM 10 MG PO TABS
10.0000 mg | ORAL_TABLET | Freq: Every day | ORAL | Status: DC
  Administered 2012-06-13 – 2012-06-20 (×8): 10 mg via ORAL
  Filled 2012-06-13 (×9): qty 1

## 2012-06-13 MED ORDER — LACTATED RINGERS IV SOLN
INTRAVENOUS | Status: DC | PRN
  Administered 2012-06-13 (×2): via INTRAVENOUS

## 2012-06-13 MED ORDER — LINAGLIPTIN 5 MG PO TABS
5.0000 mg | ORAL_TABLET | Freq: Every day | ORAL | Status: DC
  Administered 2012-06-13 – 2012-06-20 (×7): 5 mg via ORAL
  Filled 2012-06-13 (×11): qty 1

## 2012-06-13 MED ORDER — ONDANSETRON HCL 4 MG/2ML IJ SOLN: 4.0000 mg | Freq: Once | INTRAMUSCULAR | Status: DC | PRN

## 2012-06-13 MED ORDER — ALBUTEROL SULFATE HFA 108 (90 BASE) MCG/ACT IN AERS
2.0000 | INHALATION_SPRAY | Freq: Four times a day (QID) | RESPIRATORY_TRACT | Status: DC | PRN
  Filled 2012-06-13: qty 6.7

## 2012-06-13 MED ORDER — SENNOSIDES 8.6 MG PO TABS: 1.0000 | ORAL_TABLET | Freq: Every day | ORAL | Status: DC

## 2012-06-13 MED ORDER — 0.9 % SODIUM CHLORIDE (POUR BTL) OPTIME
TOPICAL | Status: DC | PRN
  Administered 2012-06-13: 1000 mL

## 2012-06-13 MED ORDER — HYDROMORPHONE HCL PF 1 MG/ML IJ SOLN
0.2500 mg | INTRAMUSCULAR | Status: DC | PRN
  Administered 2012-06-13 (×2): 0.5 mg via INTRAVENOUS

## 2012-06-13 MED ORDER — SODIUM CHLORIDE 0.9 % IR SOLN
Status: DC | PRN
  Administered 2012-06-13: 1000 mL

## 2012-06-13 MED ORDER — LIDOCAINE 5 % EX PTCH
1.0000 | MEDICATED_PATCH | CUTANEOUS | Status: DC
  Administered 2012-06-13 – 2012-06-15 (×2): 1 via TRANSDERMAL
  Filled 2012-06-13 (×9): qty 1

## 2012-06-13 MED ORDER — PHENYLEPHRINE HCL 10 MG/ML IJ SOLN
INTRAMUSCULAR | Status: DC | PRN
  Administered 2012-06-13 (×5): 80 ug via INTRAVENOUS

## 2012-06-13 MED ORDER — COLCHICINE 0.6 MG PO TABS
0.6000 mg | ORAL_TABLET | Freq: Every day | ORAL | Status: DC
  Administered 2012-06-13 – 2012-06-21 (×9): 0.6 mg via ORAL
  Filled 2012-06-13 (×9): qty 1

## 2012-06-13 MED ORDER — FOLIC ACID 1 MG PO TABS
1.0000 mg | ORAL_TABLET | Freq: Every day | ORAL | Status: DC
  Administered 2012-06-13 – 2012-06-21 (×9): 1 mg via ORAL
  Filled 2012-06-13 (×9): qty 1

## 2012-06-13 MED ORDER — FLUTICASONE PROPIONATE 50 MCG/ACT NA SUSP
2.0000 | Freq: Every day | NASAL | Status: DC
  Administered 2012-06-13 – 2012-06-18 (×5): 2 via NASAL
  Filled 2012-06-13 (×4): qty 16

## 2012-06-13 MED ORDER — PANTOPRAZOLE SODIUM 40 MG PO TBEC
40.0000 mg | DELAYED_RELEASE_TABLET | Freq: Every day | ORAL | Status: DC
  Administered 2012-06-13 – 2012-06-21 (×9): 40 mg via ORAL
  Filled 2012-06-13 (×9): qty 1

## 2012-06-13 MED ORDER — INSULIN ASPART 100 UNIT/ML ~~LOC~~ SOLN
0.0000 [IU] | Freq: Three times a day (TID) | SUBCUTANEOUS | Status: DC
  Administered 2012-06-13: 5 [IU] via SUBCUTANEOUS
  Administered 2012-06-14: 3 [IU] via SUBCUTANEOUS
  Administered 2012-06-14 – 2012-06-16 (×2): 2 [IU] via SUBCUTANEOUS
  Administered 2012-06-16: 3 [IU] via SUBCUTANEOUS
  Administered 2012-06-16: 2 [IU] via SUBCUTANEOUS
  Filled 2012-06-13: qty 0.15

## 2012-06-13 MED ORDER — SODIUM CHLORIDE 0.9 % IV SOLN
10.0000 mg | INTRAVENOUS | Status: DC | PRN
  Administered 2012-06-13: 20 ug/min via INTRAVENOUS

## 2012-06-13 MED ORDER — SENNA 8.6 MG PO TABS
1.0000 | ORAL_TABLET | Freq: Every day | ORAL | Status: DC
  Administered 2012-06-13 – 2012-06-21 (×5): 8.6 mg via ORAL
  Filled 2012-06-13 (×10): qty 1

## 2012-06-13 MED ORDER — PNEUMOCOCCAL VAC POLYVALENT 25 MCG/0.5ML IJ INJ
0.5000 mL | INJECTION | INTRAMUSCULAR | Status: DC
  Filled 2012-06-13: qty 0.5

## 2012-06-13 MED ORDER — GLUCOSE BLOOD VI STRP: 1.0000 | ORAL_STRIP | Status: DC | PRN

## 2012-06-13 MED ORDER — BUPIVACAINE-EPINEPHRINE PF 0.25-1:200000 % IJ SOLN
INTRAMUSCULAR | Status: AC
  Filled 2012-06-13: qty 30

## 2012-06-13 MED ORDER — PROPOFOL 10 MG/ML IV BOLUS
INTRAVENOUS | Status: DC | PRN
  Administered 2012-06-13: 20 mg via INTRAVENOUS
  Administered 2012-06-13: 30 mg via INTRAVENOUS
  Administered 2012-06-13: 140 mg via INTRAVENOUS

## 2012-06-13 MED ORDER — CEFAZOLIN SODIUM-DEXTROSE 2-3 GM-% IV SOLR
INTRAVENOUS | Status: AC
  Filled 2012-06-13: qty 50

## 2012-06-13 MED ORDER — CILOSTAZOL 100 MG PO TABS
100.0000 mg | ORAL_TABLET | Freq: Two times a day (BID) | ORAL | Status: DC
  Administered 2012-06-13 – 2012-06-18 (×10): 100 mg via ORAL
  Filled 2012-06-13 (×13): qty 1

## 2012-06-13 MED ORDER — GLYCOPYRROLATE 0.2 MG/ML IJ SOLN
INTRAMUSCULAR | Status: DC | PRN
  Administered 2012-06-13: 0.4 mg via INTRAVENOUS

## 2012-06-13 MED ORDER — FENTANYL CITRATE 0.05 MG/ML IJ SOLN
INTRAMUSCULAR | Status: DC | PRN
  Administered 2012-06-13 (×2): 50 ug via INTRAVENOUS
  Administered 2012-06-13: 150 ug via INTRAVENOUS

## 2012-06-13 SURGICAL SUPPLY — 52 items
APPLICATOR COTTON TIP 6IN STRL (MISCELLANEOUS) ×3 IMPLANT
BLADE SURG 10 STRL SS (BLADE) ×1 IMPLANT
BLADE SURG ROTATE 9660 (MISCELLANEOUS) ×1 IMPLANT
CANISTER SUCTION 2500CC (MISCELLANEOUS) ×1 IMPLANT
CHLORAPREP W/TINT 26ML (MISCELLANEOUS) ×2 IMPLANT
CLOTH BEACON ORANGE TIMEOUT ST (SAFETY) ×2 IMPLANT
COVER SURGICAL LIGHT HANDLE (MISCELLANEOUS) ×2 IMPLANT
DECANTER SPIKE VIAL GLASS SM (MISCELLANEOUS) ×2 IMPLANT
DEVICE SECURE STRAP 25 ABSORB (INSTRUMENTS) ×2 IMPLANT
DEVICE TROCAR PUNCTURE CLOSURE (ENDOMECHANICALS) ×2 IMPLANT
DRAIN CHANNEL 19F RND (DRAIN) ×2 IMPLANT
DRAPE UTILITY 15X26 W/TAPE STR (DRAPE) ×4 IMPLANT
ELECT CAUTERY BLADE 6.4 (BLADE) ×1 IMPLANT
ELECT REM PT RETURN 9FT ADLT (ELECTROSURGICAL) ×2
ELECTRODE REM PT RTRN 9FT ADLT (ELECTROSURGICAL) ×1 IMPLANT
EVACUATOR SILICONE 100CC (DRAIN) ×2 IMPLANT
GLOVE BIO SURGEON STRL SZ7.5 (GLOVE) ×1 IMPLANT
GLOVE BIOGEL PI IND STRL 7.5 (GLOVE) IMPLANT
GLOVE BIOGEL PI INDICATOR 7.5 (GLOVE) ×1
GLOVE EUDERMIC 7 POWDERFREE (GLOVE) ×2 IMPLANT
GOWN PREVENTION PLUS XLARGE (GOWN DISPOSABLE) ×2 IMPLANT
GOWN STRL NON-REIN LRG LVL3 (GOWN DISPOSABLE) ×4 IMPLANT
KIT BASIN OR (CUSTOM PROCEDURE TRAY) ×2 IMPLANT
KIT ROOM TURNOVER OR (KITS) ×2 IMPLANT
MARKER SKIN DUAL TIP RULER LAB (MISCELLANEOUS) ×1 IMPLANT
MESH PARIETEX 25X20 (Mesh General) ×1 IMPLANT
NDL SPNL 22GX3.5 QUINCKE BK (NEEDLE) ×1 IMPLANT
NEEDLE SPNL 22GX3.5 QUINCKE BK (NEEDLE) ×2 IMPLANT
NS IRRIG 1000ML POUR BTL (IV SOLUTION) ×2 IMPLANT
PAD ARMBOARD 7.5X6 YLW CONV (MISCELLANEOUS) ×4 IMPLANT
PEN SKIN MARKING BROAD (MISCELLANEOUS) ×2 IMPLANT
PENCIL BUTTON HOLSTER BLD 10FT (ELECTRODE) ×1 IMPLANT
SCALPEL HARMONIC ACE (MISCELLANEOUS) ×1 IMPLANT
SCISSORS LAP 5X35 DISP (ENDOMECHANICALS) ×1 IMPLANT
SET IRRIG TUBING LAPAROSCOPIC (IRRIGATION / IRRIGATOR) IMPLANT
SLEEVE ENDOPATH XCEL 5M (ENDOMECHANICALS) ×4 IMPLANT
SPONGE GAUZE 4X4 12PLY (GAUZE/BANDAGES/DRESSINGS) ×2 IMPLANT
SPONGE LAP 18X18 X RAY DECT (DISPOSABLE) ×1 IMPLANT
SUT ETHILON 2 0 FS 18 (SUTURE) ×2 IMPLANT
SUT MNCRL AB 4-0 PS2 18 (SUTURE) ×2 IMPLANT
SUT NOVA 1 T20/GS 25DT (SUTURE) ×2 IMPLANT
SUT NOVA NAB DX-16 0-1 5-0 T12 (SUTURE) ×2 IMPLANT
SUT NOVA T20/GS 25 (SUTURE) ×1 IMPLANT
TOWEL OR 17X24 6PK STRL BLUE (TOWEL DISPOSABLE) ×2 IMPLANT
TOWEL OR 17X26 10 PK STRL BLUE (TOWEL DISPOSABLE) ×2 IMPLANT
TRAY FOLEY CATH 14FRSI W/METER (CATHETERS) IMPLANT
TRAY LAPAROSCOPIC (CUSTOM PROCEDURE TRAY) ×2 IMPLANT
TROCAR XCEL NON-BLD 11X100MML (ENDOMECHANICALS) IMPLANT
TROCAR XCEL NON-BLD 5MMX100MML (ENDOMECHANICALS) ×2 IMPLANT
TUBE CONNECTING 20X1/4 (TUBING) ×1 IMPLANT
WATER STERILE IRR 1000ML POUR (IV SOLUTION) ×1 IMPLANT
YANKAUER SUCT BULB TIP NO VENT (SUCTIONS) ×1 IMPLANT

## 2012-06-13 NOTE — Progress Notes (Signed)
Call to dr Noreene Larsson to report patient o2 sats 89 or 90 on simple mask at 10 l of o2. He to come to bedside

## 2012-06-13 NOTE — Preoperative (Signed)
Beta Blockers   Reason not to administer Beta Blockers:Not Applicable, last dose 06/13/12 at 0300

## 2012-06-13 NOTE — Op Note (Signed)
Patient Name:           [name]   Date of Surgery:        06/13/2012  Pre op Diagnosis:      Multiple incarcerated ventral incisional hernias, severe intra-abdominal adhesions  Post op Diagnosis:    Same  Procedure:                 Laparoscopic lysis of adhesions requiring 75 minutes, laparoscopic-assisted repair of multiple ventral hernias with inlay Parietexx composite mesh, unilateral left-sided component separation technique.  Surgeon:                     [name]. Derrell Lolling, M.D., FACS  Assistant:                      Magnus Ivan, RNFA.  Operative Indications:   [name] is a 66 y.o. female.  This patient underwent laparoscopic-assisted right colectomy by me on April 22, 2011. She had a villous adenoma. She has had an abdominal hysterectomy in the past through a midline incision is well. Her comorbidities include obesity, congestive heart failure, obstructive sleep apnea, COPD, compensated cirrhosis, hypertension, coronary artery disease, insulin-dependent diabetes mellitus, and peripheral vascular disease. She is followed by Dr. Nanetta Batty and Dr. Fleet Contras.  Examination reveals a protuberant abdomen with at least 2 hernia defects just above the umbilicus. I sent her for a CT scan which shows a ventral incisional hernia in the mid upper abdomen contained fat and a little of the transverse colon. There was no inflammation. There was no obstruction. There was no ascites. There were no liver lesions. There were multiple renal lesions. She has subsequently seen Dr. Su Grand. A MRI showed no worrisome renal lesions. Changes of chronic pancreatitis are present.    She is strongly motivated to have her hernia is repaired. She is aware that she is at increased risk for recurrence and surgical complications and medical complications. She is brought to the operating room electively.  Operative Findings:       The patient had extensive intra-abdominal adhesions that took  about 75 minutes to take down laparoscopically. Is lots of omentum and transverse colon up in her 2 hernia defects adhesins were soft and we were able to take all of this down laparoscopically without any injury. Once all the adhesions were taken down we became aware that the hernia defects were at least 8 cm in size and were not appropriate for several bridging technique. That led to a laparoscopic-assisted approach and complete closure of the fascia over the mesh.  Procedure in Detail:          Following the induction of general endotracheal anesthesia, intravenous antibiotics were given, Foley catheter was inserted. Oral gastric tube was placed. The abdomen was prepped and draped in a sterile fashion. Surgical time out was performed. I placed a 5 mm optical port in the left subcostal region. Entry was uneventful. Pneumoperitoneum was created. I was able to place two 5 mm trocars in the left mid and left lower abdomen. Through these 3 trocars I had to be very careful because of the transverse colon and small bowel, but the adhesions were soft enough to where visualization was good and I was able to completely take all the adhesions down without injury to the bowel. Careful inspection was performed. At this point I could tell that she had 2 large hernia defects that were much too large  to bridge with mesh and I knew that we would have to completely mobilize and close the fascia.  At this point I made a midline incision and entered the hernia sac. I debrided the hernia sac and removed it. I as able to identify the edge of the fascia and I undermined the subcutaneous tissue circumferentially back about 6  centimeters or more. Abdominal wall was somewhat thin throughout. Hemostasis was excellent. The wound was irrigated. There was no bleeding. The GI tract was inspected and there was no evidence of injury. I brought a 25 cm x 20 cm piece of Parietex composite mesh to the operative field. I trimmed this to  accommodate the wound. On the left side I made an incision in the external oblique fascia as a component separation technique and this helped mobilize the fascia back to the midline and we were ultimately able to close the fascia in the midline of the mesh. The parietex mesh was sutured in place with multiple interrupted mattress sutures of #1 Novofil,And I was careful to place the smooth side of the mesh down toward the viscera and the rough side up toward the parietal peritoneum.. I was able to use this as an inlay technique and overlap the fascia 6 or 7 cm in all directions. After all the sutures were placed and tied I inspected the repair there were no gaps or redundancy or open places I felt that the mesh placement was good. The midline fascia was closed with a running suture of #1 Novofil and sutured with interrupted sutures of #1 Novofil. Two 19 French Blake drains were placed in the subcutaneous  tissue and brought through separate stab incisions superiorly, sutured in place with nylon sutures and connected to suction bulbs. The skin was closed with skin staples. The trocar sites the left lower abdomen were closed with skin staples.  Bandages and a Velcro binder were placed.  The patient tolerated the procedure well. There were no complications. EBL 30-50 cc. Counts correct.     [name]. Derrell Lolling, M.D., FACS General and Minimally Invasive Surgery Breast and Colorectal Surgery  06/13/2012 11:10 AM

## 2012-06-13 NOTE — Transfer of Care (Signed)
Immediate Anesthesia Transfer of Care Note  Patient: [name]  Procedure(s) Performed: Procedure(s) (LRB) with comments: LAPAROSCOPIC VENTRAL HERNIA (N/A) - Laparoscopic Assisted Ventral Hernia with Mesh INSERTION OF MESH (N/A) LYSIS OF ADHESION (N/A)  Patient Location: PACU  Anesthesia Type: General  Level of Consciousness: awake, alert  and patient cooperative  Airway & Oxygen Therapy: Patient Spontanous Breathing and Patient connected to nasal cannula oxygen  Post-op Assessment: Report given to PACU RN  Post vital signs: Reviewed and stable  Complications: No apparent anesthesia complications

## 2012-06-13 NOTE — Anesthesia Postprocedure Evaluation (Signed)
  Anesthesia Post-op Note  Patient: [name]  Procedure(s) Performed: Procedure(s) (LRB) with comments: LAPAROSCOPIC VENTRAL HERNIA (N/A) - Laparoscopic Assisted Ventral Hernia with Mesh INSERTION OF MESH (N/A) LYSIS OF ADHESION (N/A)  Patient Location: PACU  Anesthesia Type: General  Level of Consciousness: awake, oriented and sedated  Airway and Oxygen Therapy: Patient Spontanous Breathing  Post-op Pain: mild  Post-op Assessment: Post-op Vital signs reviewed and Patient's Cardiovascular Status Stable  Post-op Vital Signs: stable  Complications: No apparent anesthesia complications

## 2012-06-13 NOTE — Anesthesia Preprocedure Evaluation (Addendum)
Anesthesia Evaluation  Patient identified by MRN, date of birth, ID band Patient awake    Reviewed: Allergy & Precautions, H&P , NPO status , Patient's Chart, lab work & pertinent test results, reviewed documented beta blocker date and time   Airway Mallampati: II TM Distance: >3 FB Neck ROM: Full    Dental  (+) Edentulous Upper, Edentulous Lower and Dental Advisory Given   Pulmonary shortness of breath and with exertion, asthma , sleep apnea , pneumonia -, resolved, COPD COPD inhaler,  breath sounds clear to auscultation        Cardiovascular hypertension, Pt. on medications and Pt. on home beta blockers +CHF Rhythm:Regular Rate:Normal     Neuro/Psych PSYCHIATRIC DISORDERS Anxiety Depression    GI/Hepatic GERD-  Medicated and Controlled,  Endo/Other  diabetes, Type 2, Insulin Dependent and Oral Hypoglycemic Agents  Renal/GU      Musculoskeletal   Abdominal   Peds  Hematology   Anesthesia Other Findings   Reproductive/Obstetrics                          Anesthesia Physical Anesthesia Plan  ASA: III  Anesthesia Plan: General   Post-op Pain Management:    Induction: Intravenous  Airway Management Planned: Oral ETT  Additional Equipment:   Intra-op Plan:   Post-operative Plan: Extubation in OR  Informed Consent: I have reviewed the patients History and Physical, chart, labs and discussed the procedure including the risks, benefits and alternatives for the proposed anesthesia with the patient or authorized representative who has indicated his/her understanding and acceptance.     Plan Discussed with: CRNA and Surgeon  Anesthesia Plan Comments:         Anesthesia Quick Evaluation

## 2012-06-13 NOTE — Anesthesia Procedure Notes (Signed)
Procedure Name: Intubation Date/Time: 06/13/2012 8:47 AM Performed by: Jefm Miles E Pre-anesthesia Checklist: Patient identified, Timeout performed, Emergency Drugs available, Suction available and Patient being monitored Patient Re-evaluated:Patient Re-evaluated prior to inductionOxygen Delivery Method: Circle system utilized Preoxygenation: Pre-oxygenation with 100% oxygen Intubation Type: IV induction Ventilation: Mask ventilation without difficulty and Oral airway inserted - appropriate to patient size Laryngoscope Size: Mac and 3 Grade View: Grade I Tube type: Oral Tube size: 7.0 mm Number of attempts: 1 Airway Equipment and Method: Stylet Placement Confirmation: ETT inserted through vocal cords under direct vision,  breath sounds checked- equal and bilateral and positive ETCO2 Secured at: 22 cm Tube secured with: Tape Dental Injury: Teeth and Oropharynx as per pre-operative assessment

## 2012-06-13 NOTE — Interval H&P Note (Signed)
History and Physical Interval Note:  06/13/2012 8:12 AM  [name]  has presented today for surgery, with the diagnosis of incisional hernia  The goals and the  various methods of treatment have been discussed with the patient and family. After consideration of risks, benefits and other options for treatment, the patient has consented to  Procedure(s) (LRB) with comments: LAPAROSCOPIC VENTRAL HERNIA (N/A) - laparoscopic ventral hernia repair with mesh,possible open INSERTION OF MESH (N/A) as a surgical intervention .  The patient's history has been reviewed, patient examined, no change in status, stable for surgery.  I have reviewed the patient's chart and labs.  Questions were answered to the patient's satisfaction.     Ernestene Mention

## 2012-06-13 NOTE — Progress Notes (Signed)
Utilization review completed.  

## 2012-06-14 ENCOUNTER — Encounter (HOSPITAL_COMMUNITY): Payer: Self-pay | Admitting: General Surgery

## 2012-06-14 LAB — BASIC METABOLIC PANEL
BUN: 21 mg/dL (ref 6–23)
CO2: 26 mEq/L (ref 19–32)
Calcium: 8.5 mg/dL (ref 8.4–10.5)
Chloride: 104 mEq/L (ref 96–112)
Creatinine, Ser: 1.23 mg/dL — ABNORMAL HIGH (ref 0.50–1.10)
GFR calc Af Amer: 51 mL/min — ABNORMAL LOW (ref >90)

## 2012-06-14 LAB — GLUCOSE, CAPILLARY
Glucose-Capillary: 196 mg/dL — ABNORMAL HIGH (ref 70–99)
Glucose-Capillary: 117 mg/dL — ABNORMAL HIGH (ref 70–99)

## 2012-06-14 LAB — CBC
HCT: 35.5 % — ABNORMAL LOW (ref 36.0–46.0)
MCHC: 30.7 g/dL (ref 30.0–36.0)
MCV: 81.8 fL (ref 78.0–100.0)
Platelets: 260 10*3/uL (ref 150–400)
RDW: 15.2 % (ref 11.5–15.5)
WBC: 12.2 10*3/uL — ABNORMAL HIGH (ref 4.0–10.5)

## 2012-06-14 LAB — HEMOGLOBIN A1C
Hgb A1c MFr Bld: 8 % — ABNORMAL HIGH (ref <5.7)
Mean Plasma Glucose: 183 mg/dL — ABNORMAL HIGH (ref <117)

## 2012-06-14 MED ORDER — PNEUMOCOCCAL VAC POLYVALENT 25 MCG/0.5ML IJ INJ
0.5000 mL | INJECTION | INTRAMUSCULAR | Status: AC
  Filled 2012-06-14 (×2): qty 0.5

## 2012-06-14 MED ORDER — MORPHINE SULFATE 2 MG/ML IJ SOLN
1.0000 mg | INTRAMUSCULAR | Status: DC | PRN
  Administered 2012-06-14: 2 mg via INTRAVENOUS
  Administered 2012-06-14 (×2): 1 mg via INTRAVENOUS
  Filled 2012-06-14 (×3): qty 1

## 2012-06-14 MED ORDER — INFLUENZA VIRUS VACC SPLIT PF IM SUSP: 0.5000 mL | INTRAMUSCULAR | Status: DC | PRN

## 2012-06-14 MED ORDER — BISACODYL 5 MG PO TBEC
5.0000 mg | DELAYED_RELEASE_TABLET | Freq: Two times a day (BID) | ORAL | Status: AC
  Administered 2012-06-14 (×2): 5 mg via ORAL
  Filled 2012-06-14: qty 1

## 2012-06-14 MED ORDER — SODIUM CHLORIDE 0.9 % IV SOLN
INTRAVENOUS | Status: DC
  Administered 2012-06-14: 22:00:00 via INTRAVENOUS
  Administered 2012-06-15: 75 mL/h via INTRAVENOUS

## 2012-06-14 NOTE — Progress Notes (Signed)
1 Day Post-Op  Subjective: Awake. Answers questions appropriately, But less talkative than usual. Has been sleepy during the night. Moving very slowly. Required maximal effort of 2 people to get her up in chair this morning. Denies shortness of breath. Denies chest pain. Denies nausea.Urine output 800 cc for 12 hours.  Potassium 5.6. Creatinine 1.23,At baseline..  Glucose varies between 143 and 21. Hemoglobin 10.9.  Objective: Vital signs in last 24 hours: Temp:  [96.9 F (36.1 C)-98.2 F (36.8 C)] 98.1 F (36.7 C) (10/17 0427) Pulse Rate:  [55-101] 101  (10/17 0427) Resp:  [12-20] 20  (10/17 0427) BP: (103-164)/(63-84) 112/73 mmHg (10/17 0427) SpO2:  [88 %-98 %] 93 % (10/17 0427) FiO2 (%):  [40 %] 40 % (10/16 1345) Weight:  [155 lb 12 oz (70.648 kg)-162 lb 7.7 oz (73.7 kg)] 162 lb 7.7 oz (73.7 kg) (10/16 1448)    Intake/Output from previous day: 10/16 0701 - 10/17 0700 In: 3375.3 [P.O.:60; I.V.:3263.3; IV Piggyback:52] Out: 1180 [Urine:985; Drains:145; Blood:50] Intake/Output this shift: Total I/O In: 1000 [I.V.:1000] Out: 765 [Urine:700; Drains:65]  General appearance: lethargic. Moves very slowly. Does verbalize appropriately to questions but not very talkative. Able to sit up in chair. Borderline tachycardia. Resp: clear anteriorly. Respiratory effort marginal. No wheeze. No rhonchi. GI: abdomen soft, appropriately tender, minimal bowel sounds. Wounds clean. Both drains functioning with serosanguineous fluid, not excessive.  Lab Results:  Results for orders placed during the hospital encounter of 06/13/12 (from the past 24 hour(s))  GLUCOSE, CAPILLARY     Status: Abnormal   Collection Time   06/13/12  7:19 AM      Component Value Range   Glucose-Capillary 127 (*) 70 - 99 mg/dL  GLUCOSE, CAPILLARY     Status: Abnormal   Collection Time   06/13/12 11:24 AM      Component Value Range   Glucose-Capillary 157 (*) 70 - 99 mg/dL   Comment 1 Notify RN    GLUCOSE, CAPILLARY      Status: Abnormal   Collection Time   06/13/12  4:08 PM      Component Value Range   Glucose-Capillary 201 (*) 70 - 99 mg/dL   Comment 1 Documented in Chart     Comment 2 Notify RN    CBC     Status: Abnormal   Collection Time   06/13/12  4:50 PM      Component Value Range   WBC 15.2 (*) 4.0 - 10.5 K/uL   RBC 4.54  3.87 - 5.11 MIL/uL   Hemoglobin 11.5 (*) 12.0 - 15.0 g/dL   HCT 16.1  09.6 - 04.5 %   MCV 81.3  78.0 - 100.0 fL   MCH 25.3 (*) 26.0 - 34.0 pg   MCHC 31.2  30.0 - 36.0 g/dL   RDW 40.9  81.1 - 91.4 %   Platelets 232  150 - 400 K/uL  CREATININE, SERUM     Status: Abnormal   Collection Time   06/13/12  4:50 PM      Component Value Range   Creatinine, Ser 1.29 (*) 0.50 - 1.10 mg/dL   GFR calc non Af Amer 42 (*) >90 mL/min   GFR calc Af Amer 49 (*) >90 mL/min  HEMOGLOBIN A1C     Status: Abnormal   Collection Time   06/13/12  4:50 PM      Component Value Range   Hemoglobin A1C 8.0 (*) <5.7 %   Mean Plasma Glucose 183 (*) <117 mg/dL  GLUCOSE,  CAPILLARY     Status: Abnormal   Collection Time   06/13/12  9:54 PM      Component Value Range   Glucose-Capillary 143 (*) 70 - 99 mg/dL   Comment 1 Notify RN     Comment 2 Documented in Chart    CBC     Status: Abnormal   Collection Time   06/14/12  4:36 AM      Component Value Range   WBC 12.2 (*) 4.0 - 10.5 K/uL   RBC 4.34  3.87 - 5.11 MIL/uL   Hemoglobin 10.9 (*) 12.0 - 15.0 g/dL   HCT 60.4 (*) 54.0 - 98.1 %   MCV 81.8  78.0 - 100.0 fL   MCH 25.1 (*) 26.0 - 34.0 pg   MCHC 30.7  30.0 - 36.0 g/dL   RDW 19.1  47.8 - 29.5 %   Platelets 260  150 - 400 K/uL     Studies/Results: @RISRSLT24 @     . ceFAZolin  2 g Intravenous Once  .  ceFAZolin (ANCEF) IV  2 g Intravenous Q8H  . cilostazol  100 mg Oral BID AC  . colchicine  0.6 mg Oral Daily  . fluticasone  2 spray Each Nare Daily  . folic acid  1 mg Oral Daily  . glimepiride  4 mg Oral QAC breakfast  . heparin  5,000 Units Subcutaneous Q8H  . HYDROmorphone       . influenza  inactive virus vaccine  0.5 mL Intramuscular Tomorrow-1000  . insulin aspart  0-15 Units Subcutaneous TID WC  . insulin glargine  8 Units Subcutaneous QHS  . lidocaine  1 patch Transdermal Q24H  . linagliptin  5 mg Oral Daily  . metoprolol  50 mg Oral BID  . montelukast  10 mg Oral QHS  . pantoprazole  40 mg Oral Daily  . pneumococcal 23 valent vaccine  0.5 mL Intramuscular Tomorrow-1000  . senna  1 tablet Oral Daily  . DISCONTD: senna  1 tablet Oral Daily     Assessment/Plan: s/p Procedure(s): LAPAROSCOPIC VENTRAL HERNIA INSERTION OF MESH LYSIS OF ADHESION  POD #1. Stable. Significantly deconditioned. Mobilize out of bed. Discontinue Foley Clear liquid diet Incentive spirometry Reduce dose morphine due to somnolence Ducolax suppository. Decrease IV to 75 cc/hr. Continue telemetry and SDU status.   Multiple comorbidities:  Obesity Congestive heart failure followed by Nanetta Batty Obstructive sleep apnea COPD Compensated cirrhosis Hypertension Coronary artery disease-Bone daily metoprolol 50 mg. Diabetes mellitus  on Lantus insulin, SSI, amaryl Peripheral vascular disease. History laparoscopic right colectomy for villous adenoma History abdominal hysterectomy.    LOS: 1 day    Shreena Baines M 06/14/2012  . .prob

## 2012-06-15 ENCOUNTER — Inpatient Hospital Stay (HOSPITAL_COMMUNITY): Payer: PRIVATE HEALTH INSURANCE

## 2012-06-15 LAB — COMPREHENSIVE METABOLIC PANEL
ALT: <5 U/L (ref 0–35)
AST: 18 U/L (ref 0–37)
Albumin: 2.3 g/dL — ABNORMAL LOW (ref 3.5–5.2)
Alkaline Phosphatase: 67 U/L (ref 39–117)
BUN: 22 mg/dL (ref 6–23)
Chloride: 103 mEq/L (ref 96–112)
Potassium: 5.1 mEq/L (ref 3.5–5.1)
Sodium: 136 mEq/L (ref 135–145)
Total Protein: 6.1 g/dL (ref 6.0–8.3)

## 2012-06-15 LAB — CBC
MCHC: 30.4 g/dL (ref 30.0–36.0)
Platelets: 236 10*3/uL (ref 150–400)
RDW: 15.6 % — ABNORMAL HIGH (ref 11.5–15.5)
WBC: 16.3 10*3/uL — ABNORMAL HIGH (ref 4.0–10.5)

## 2012-06-15 LAB — GLUCOSE, CAPILLARY
Glucose-Capillary: 113 mg/dL — ABNORMAL HIGH (ref 70–99)
Glucose-Capillary: 82 mg/dL (ref 70–99)
Glucose-Capillary: 108 mg/dL — ABNORMAL HIGH (ref 70–99)

## 2012-06-15 MED ORDER — BISACODYL 10 MG RE SUPP
10.0000 mg | Freq: Once | RECTAL | Status: AC
  Administered 2012-06-15: 10 mg via RECTAL
  Filled 2012-06-15: qty 1

## 2012-06-15 NOTE — Progress Notes (Signed)
Pt TX to 6N bed 30, VSS, still requiring 3-4L O2. Completed CXR, called report.

## 2012-06-15 NOTE — Progress Notes (Signed)
Patient admitted to 6N30 from 59. VSS, 4LO2. A&Ox4. Patient oriented to room and floor. Call bell within reach. RN will continue to monitor.

## 2012-06-15 NOTE — Progress Notes (Signed)
2 Days Post-Op  Subjective: More alert, but still lethargic. Appropriate and oriented. Deconditioned  No arrhythmias. Denies dyspnea. Requiring supplemental O2 to keep SaO2 greater than 90. No cough.  No stool or flatus. Anorexia but no nausea.voiding adequate amounts.  Max temperature 100.3.  Potassium 5.1. Creatinine 1.23. Liver function tests normal. WBC 16,300 noted. Hemoglobin stable, 10.2.  Objective: Vital signs in last 24 hours: Temp:  [98.1 F (36.7 C)-100.3 F (37.9 C)] 98.5 F (36.9 C) (10/18 0353) Pulse Rate:  [90-104] 100  (10/18 0353) Resp:  [21-31] 31  (10/18 0353) BP: (128-137)/(60-73) 135/68 mmHg (10/18 0353) SpO2:  [88 %-95 %] 93 % (10/18 0353)    Intake/Output from previous day: 10/17 0701 - 10/18 0700 In: 1947.1 [I.V.:1947.1] Out: 810 [Urine:725; Drains:85] Intake/Output this shift: Total I/O In: 1947.1 [I.V.:1947.1] Out: 660 [Urine:600; Drains:60]  General appearance:  alert. No acute distress. Somewhat  feeble and weak. Skin warm and dry. Resp: decreased breath sounds at bases. No rhonchi or wheeze. GI: abdomen soft, appropriately tender, wounds clean. Minimal bowel sounds. Drainage serosanguineous, was drains functioning.  Lab Results:  Results for orders placed during the hospital encounter of 06/13/12 (from the past 24 hour(s))  GLUCOSE, CAPILLARY     Status: Abnormal   Collection Time   06/14/12  8:02 AM      Component Value Range   Glucose-Capillary 196 (*) 70 - 99 mg/dL  GLUCOSE, CAPILLARY     Status: Abnormal   Collection Time   06/14/12 12:19 PM      Component Value Range   Glucose-Capillary 127 (*) 70 - 99 mg/dL   Comment 1 Notify RN     Comment 2 Documented in Chart    GLUCOSE, CAPILLARY     Status: Abnormal   Collection Time   06/14/12  4:43 PM      Component Value Range   Glucose-Capillary 117 (*) 70 - 99 mg/dL   Comment 1 Notify RN     Comment 2 Documented in Chart    CBC     Status: Abnormal   Collection Time   06/15/12   4:45 AM      Component Value Range   WBC 16.3 (*) 4.0 - 10.5 K/uL   RBC 4.10  3.87 - 5.11 MIL/uL   Hemoglobin 10.2 (*) 12.0 - 15.0 g/dL   HCT 16.1 (*) 09.6 - 04.5 %   MCV 81.7  78.0 - 100.0 fL   MCH 24.9 (*) 26.0 - 34.0 pg   MCHC 30.4  30.0 - 36.0 g/dL   RDW 40.9 (*) 81.1 - 91.4 %   Platelets 236  150 - 400 K/uL  COMPREHENSIVE METABOLIC PANEL     Status: Abnormal (Preliminary result)   Collection Time   06/15/12  4:45 AM      Component Value Range   Sodium 136  135 - 145 mEq/L   Potassium 5.1  3.5 - 5.1 mEq/L   Chloride 103  96 - 112 mEq/L   CO2 25  19 - 32 mEq/L   Glucose, Bld 113 (*) 70 - 99 mg/dL   BUN 22  6 - 23 mg/dL   Creatinine, Ser 7.82 (*) 0.50 - 1.10 mg/dL   Calcium 8.5  8.4 - 95.6 mg/dL   Total Protein 6.1  6.0 - 8.3 g/dL   Albumin 2.3 (*) 3.5 - 5.2 g/dL   AST 18  0 - 37 U/L   ALT PENDING  0 - 35 U/L   Alkaline Phosphatase 67  39 - 117 U/L   Total Bilirubin 0.4  0.3 - 1.2 mg/dL   GFR calc non Af Amer 44 (*) >90 mL/min   GFR calc Af Amer 51 (*) >90 mL/min     Studies/Results: @RISRSLT24 @     . bisacodyl  5 mg Oral BID  .  ceFAZolin (ANCEF) IV  2 g Intravenous Q8H  . cilostazol  100 mg Oral BID AC  . colchicine  0.6 mg Oral Daily  . fluticasone  2 spray Each Nare Daily  . folic acid  1 mg Oral Daily  . glimepiride  4 mg Oral QAC breakfast  . heparin  5,000 Units Subcutaneous Q8H  . insulin aspart  0-15 Units Subcutaneous TID WC  . insulin glargine  8 Units Subcutaneous QHS  . lidocaine  1 patch Transdermal Q24H  . linagliptin  5 mg Oral Daily  . metoprolol  50 mg Oral BID  . montelukast  10 mg Oral QHS  . pantoprazole  40 mg Oral Daily  . pneumococcal 23 valent vaccine  0.5 mL Intramuscular Tomorrow-1000  . senna  1 tablet Oral Daily  . DISCONTD: influenza  inactive virus vaccine  0.5 mL Intramuscular Tomorrow-1000  . DISCONTD: pneumococcal 23 valent vaccine  0.5 mL Intramuscular Tomorrow-1000     Assessment/Plan: s/p Procedure(s): LAPAROSCOPIC  VENTRAL HERNIA INSERTION OF MESH LYSIS OF ADHESION  POD #2. Stable. Significantly deconditioned.  Mobilize. PT consult full liquid diet - we'll go slow. Suspect and expect ileus. Incentive spirometry   Ducolax suppository.   Discontinue telemetry and transferr to 6 N., MedSurg bed..   Check chest x-ray. Due to past history sleep apnea and COPD and congestive heart failure, at increased risk for pulmonary complications.   Multiple comorbidities:  Obesity  Congestive heart failure followed by Nanetta Batty  Obstructive sleep apnea  COPD  Compensated cirrhosis  Hypertension  Coronary artery disease-Bone daily metoprolol 50 mg.  Diabetes mellitus on Lantus insulin, SSI, amaryl - good control Peripheral vascular disease.  History laparoscopic right colectomy for villous adenoma  History abdominal hysterectomy.     LOS: 2 days    Bethanny Toelle M. Derrell Lolling, M.D., [facility] General and Minimally invasive Surgery Breast and Colorectal Surgery Office:   [phone] Pager:   [phone]  06/15/2012  . .prob

## 2012-06-15 NOTE — Evaluation (Signed)
Physical Therapy Evaluation Patient Details Name: [name] MRN: [id number] DOB: [birth year] Today's Date: 06/15/2012 Time: 0865-7846 PT Time Calculation (min): 41 min  PT Assessment / Plan / Recommendation Clinical Impression  pt s/p lysis of adhesions and ventral hernia repairs.  Mobility limited by pain .  Will see pt on acute.    PT Assessment  Patient needs continued PT services    Follow Up Recommendations  Home health PT;Other (comment) (or ST-SNF if not I at D/C)    Does the patient have the potential to tolerate intense rehabilitation      Barriers to Discharge Decreased caregiver support      Equipment Recommendations  Other (comment) (TBD pt too sleepy to give food history)    Recommendations for Other Services     Frequency Min 3X/week    Precautions / Restrictions Precautions Precautions: Fall (lower risk)   Pertinent Vitals/Pain       Mobility  Bed Mobility Bed Mobility: Supine to Sit;Sitting - Scoot to Edge of Bed Supine to Sit: 3: Mod assist;HOB flat;With rails Sitting - Scoot to Edge of Bed: 4: Min assist Details for Bed Mobility Assistance: vc's for hand placement Transfers Transfers: Sit to Stand;Stand to Sit;Stand Pivot Transfers Sit to Stand: 4: Min guard;4: Min assist;From bed;From chair/3-in-1 Stand to Sit: 4: Min guard;4: Min assist;To chair/3-in-1 Stand Pivot Transfers: 4: Min assist Details for Transfer Assistance: variable assist needs due to lethargy; vc's for hand placement Ambulation/Gait Ambulation/Gait Assistance: 4: Min assist Ambulation Distance (Feet): 4 Feet (times 2) Ambulation/Gait Assistance Details: guarded with small steps Gait Pattern: Step-through pattern;Decreased step length - right;Decreased step length - left;Decreased stride length Wheelchair Mobility Wheelchair Mobility: No    Shoulder Instructions     Exercises     PT Diagnosis: Difficulty walking;Acute pain  PT Problem List: Decreased  strength;Decreased mobility;Decreased knowledge of precautions;Pain PT Treatment Interventions:     PT Goals Acute Rehab PT Goals PT Goal Formulation: With patient Time For Goal Achievement: 06/29/12 Potential to Achieve Goals: Good Pt will go Supine/Side to Sit: with modified independence;with HOB 0 degrees PT Goal: Supine/Side to Sit - Progress: Goal set today Pt will go Sit to Stand: with modified independence PT Goal: Sit to Stand - Progress: Goal set today Pt will Transfer Bed to Chair/Chair to Bed: with modified independence PT Transfer Goal: Bed to Chair/Chair to Bed - Progress: Goal set today Pt will Ambulate: >150 feet;with modified independence;with least restrictive assistive device PT Goal: Ambulate - Progress: Goal set today  Visit Information  Last PT Received On: 06/15/12 Assistance Needed: +1    Subjective Data  Subjective: you just don't know Patient Stated Goal: Home I, I need to do for myself   Prior Functioning  Home Living Lives With: Alone Available Help at Discharge: Other (Comment) Type of Home: Apartment Home Access: Level entry Entrance Stairs-Rails: Right;Left Home Layout: One level Bathroom Shower/Tub: Walk-in shower;Curtain Prior Function Level of Independence: Independent Able to Take Stairs?: Yes Driving: No Vocation: Retired Musician: No difficulties Dominant Hand: Right    Cognition  Overall Cognitive Status: Appears within functional limits for tasks assessed/performed Arousal/Alertness: Lethargic Orientation Level: Appears intact for tasks assessed Behavior During Session: [facility] for tasks performed    Extremity/Trunk Assessment Right Lower Extremity Assessment RLE ROM/Strength/Tone: [facility] for tasks assessed Left Lower Extremity Assessment LLE ROM/Strength/Tone: [facility] for tasks assessed   Balance    End of Session PT - End of Session Activity Tolerance: Patient tolerated treatment well Patient  left: in chair;with  call bell/phone within reach Nurse Communication: Mobility status  GP     Anushka Hartinger, Eliseo Gum 06/15/2012, 4:51 PM  06/15/2012  [place], PT [phone] (pager)

## 2012-06-16 DIAGNOSIS — E118 Type 2 diabetes mellitus with unspecified complications: Secondary | ICD-10-CM | POA: Diagnosis present

## 2012-06-16 DIAGNOSIS — D649 Anemia, unspecified: Secondary | ICD-10-CM

## 2012-06-16 DIAGNOSIS — N179 Acute kidney failure, unspecified: Secondary | ICD-10-CM | POA: Diagnosis present

## 2012-06-16 DIAGNOSIS — N183 Chronic kidney disease, stage 3 unspecified: Secondary | ICD-10-CM | POA: Diagnosis present

## 2012-06-16 LAB — BLOOD GAS, ARTERIAL
Acid-base deficit: 2.1 mmol/L — ABNORMAL HIGH (ref 0.0–2.0)
Drawn by: 20517
O2 Content: 2 L/min
O2 Saturation: 87.8 %
Patient temperature: 98.6

## 2012-06-16 LAB — URINALYSIS, ROUTINE W REFLEX MICROSCOPIC
Leukocytes, UA: NEGATIVE
Nitrite: NEGATIVE
Protein, ur: NEGATIVE mg/dL
Specific Gravity, Urine: 1.007 (ref 1.005–1.030)
Urobilinogen, UA: 0.2 mg/dL (ref 0.0–1.0)

## 2012-06-16 LAB — GLUCOSE, CAPILLARY
Glucose-Capillary: 121 mg/dL — ABNORMAL HIGH (ref 70–99)
Glucose-Capillary: 164 mg/dL — ABNORMAL HIGH (ref 70–99)

## 2012-06-16 MED ORDER — FUROSEMIDE 10 MG/ML IJ SOLN
40.0000 mg | Freq: Once | INTRAMUSCULAR | Status: AC
  Administered 2012-06-16: 40 mg via INTRAVENOUS
  Filled 2012-06-16: qty 4

## 2012-06-16 MED ORDER — POLYETHYLENE GLYCOL 3350 17 G PO PACK
17.0000 g | PACK | Freq: Every day | ORAL | Status: DC
  Administered 2012-06-19 – 2012-06-21 (×3): 17 g via ORAL
  Filled 2012-06-16 (×6): qty 1

## 2012-06-16 MED ORDER — PNEUMOCOCCAL VAC POLYVALENT 25 MCG/0.5ML IJ INJ
0.5000 mL | INJECTION | INTRAMUSCULAR | Status: AC
  Administered 2012-06-18: 0.5 mL via INTRAMUSCULAR
  Filled 2012-06-16: qty 0.5

## 2012-06-16 MED ORDER — ALBUTEROL SULFATE HFA 108 (90 BASE) MCG/ACT IN AERS
2.0000 | INHALATION_SPRAY | Freq: Four times a day (QID) | RESPIRATORY_TRACT | Status: DC
  Administered 2012-06-16 – 2012-06-17 (×5): 2 via RESPIRATORY_TRACT
  Filled 2012-06-16: qty 6.7

## 2012-06-16 NOTE — Progress Notes (Signed)
3 Days Post-Op  Subjective: Awaken. Oriented. Still lethargic. Has been up in chair but has not ambulated. Voiding without difficulty. Says she drank some liquids without nausea or vomiting. Reports passing flatus. No stool.  Biggest problem seems to be pulmonary. Causalgia productive. Requiring 3 L O2 4 SaO2 92%. Chest x-ray shows basilar atelectasis, left greater than right.  Afebrile. Vital signs stable. Good urine output.  Objective: Vital signs in last 24 hours: Temp:  [97.8 F (36.6 C)-99.3 F (37.4 C)] 97.8 F (36.6 C) (10/19 0217) Pulse Rate:  [72-102] 72  (10/19 0217) Resp:  [18-22] 18  (10/19 0217) BP: (107-150)/(65-76) 107/65 mmHg (10/19 0217) SpO2:  [87 %-93 %] 92 % (10/19 0217) Last BM Date: 06/13/12  Intake/Output from previous day: 10/18 0701 - 10/19 0700 In: 975 [P.O.:300; I.V.:675] Out: 420 [Urine:350; Drains:70] Intake/Output this shift: Total I/O In: -  Out: 60 [Drains:60]  General appearance: awake and oriented. Lethargic. Afebrile. Skin warm and dry. Does not appear to be in distress. Resp: Reasonable breath sounds anteriorly. Decreased breath sounds at bases. No wheeze. Cough productive. GI: abdomen soft. Protuberant. Slightly distended. Hypoactive bowel sounds. Wound is clean. Both drains functioning, serosanguineous output, decreasing volumes.  Lab Results:  Results for orders placed during the hospital encounter of 06/13/12 (from the past 24 hour(s))  GLUCOSE, CAPILLARY     Status: Normal   Collection Time   06/15/12  8:25 AM      Component Value Range   Glucose-Capillary 82  70 - 99 mg/dL   Comment 1 Notify RN     Comment 2 Documented in Chart    GLUCOSE, CAPILLARY     Status: Abnormal   Collection Time   06/15/12  5:15 PM      Component Value Range   Glucose-Capillary 108 (*) 70 - 99 mg/dL     Studies/Results: @RISRSLT24 @     . bisacodyl  10 mg Rectal Once  . cilostazol  100 mg Oral BID AC  . colchicine  0.6 mg Oral Daily  .  fluticasone  2 spray Each Nare Daily  . folic acid  1 mg Oral Daily  . glimepiride  4 mg Oral QAC breakfast  . heparin  5,000 Units Subcutaneous Q8H  . insulin aspart  0-15 Units Subcutaneous TID WC  . insulin glargine  8 Units Subcutaneous QHS  . lidocaine  1 patch Transdermal Q24H  . linagliptin  5 mg Oral Daily  . metoprolol  50 mg Oral BID  . montelukast  10 mg Oral QHS  . pantoprazole  40 mg Oral Daily  . pneumococcal 23 valent vaccine  0.5 mL Intramuscular Tomorrow-1000  . senna  1 tablet Oral Daily     Assessment/Plan: s/p Procedure(s): LAPAROSCOPIC VENTRAL HERNIA INSERTION OF MESH LYSIS OF ADHESION  POD #3. Stable. Significantly deconditioned.  Mobilize.  PT consult  full liquid diet - we'll go slow. Suspect and expect ileus. miralax ordered  Atalectasis, history OSA, COPD.Concerned that she is not mobilizing her secretions and is at risk for pneumonia. Albuterol inhaler every 6 hours Flutter valve Incentive spirometry Ambulate Decrease narcotics. Consult [facility].     Multiple comorbidities: I have requested a consultation Today by [facility] to assist in the evaluation and management of her multiple medical problems, including pulmonary. Obesity  Congestive heart failure followed by Nanetta Batty  Obstructive sleep apnea  COPD  Compensated cirrhosis  Hypertension  Coronary artery disease-Bone daily metoprolol 50 mg.  Diabetes mellitus on Lantus insulin, SSI,  amaryl - good control  Peripheral vascular disease.  History laparoscopic right colectomy for villous adenoma  History abdominal hysterectomy.     LOS: 3 days    Regina Clair M. Derrell Lolling, FrancoD., [facility] General and Minimally invasive Surgery Breast and Colorectal Surgery Office:   [phone] Pager:   [phone]  06/16/2012  . .prob

## 2012-06-16 NOTE — Consult Note (Signed)
[facility] Medical Consultation  GICELLE MATHERLY ZOX:[id number] DOB: 03-28-1946 DOA: 06/13/2012 PCP: Dorrene German, MD   Requesting physician: Claud Kelp Date of consultation: 06/16/2012 Reason for consultation: Diabetes mellitus type 2, lethargy and perioperative management  Impression/Recommendations Principal Problem:  *Incisional hernia Active Problems:  COPD (chronic obstructive pulmonary disease)  Sleep apnea  Controlled diabetes mellitus type 2 with complications  CKD (chronic kidney disease), stage III  Diastolic CHF, chronic   Lethargy -Suspect to be multifactorial, upon my interview she is fully awake alert and oriented x3. I saw her at 7:30 AM -She has obstructive sleep apnea which can cause CO2 narcosis, narcotics can cause some lethargy. -Oral discontinue her Benadryl which can make her drowsy.  -I will check ABG, I'll give some Lasix as she is according to the records 5.4 L positive since admission. -Wean off oxygen as tolerated, ambulate if it's okay with general surgery.   Diabetes mellitus type 2 -Uncontrolled DM 2 with hemoglobin A1c of 8.0 which correlate to a mean plasma glucose of 183. -Continue current regimen, patient is on her home medications. -Utilize insulin sliding scale and carbohydrate modified diet. -Reasonable controlled during the hospital, with fasting blood glucose ranges from 113-121.  CKD stage III -Secondary to her diabetes and high blood pressure. She probably will benefit from either ACE inhibitor or ARB. Can be evaluated further as outpatient.  Chronic diastolic CHF -No evidence of pulmonary edema or lower extremity edema. -As mentioned above she is +5.4 L since admission, I'll diuresis with Lasix.  I will followup again tomorrow. Please contact me if I can be of assistance in the meanwhile. Thank you for this consultation.  Chief Complaint: DM2 and perioperative management. HPI:  66 year old African American female  with past medical history of diabetes mellitus type 2, chronic diastolic CHF and CKD stage III. Came in to the hospital for elective incisional hernia repair, post surgery patient to followup mild lethargy, general surgery consulted internal medicine for further evaluation   Review of Systems:  Constitutional: generalized eweakness Eyes: negative for irritation, redness and visual disturbance Ears, nose, mouth, throat, and face: negative for earaches, epistaxis, nasal congestion and sore throat Respiratory: negative for cough, dyspnea on exertion, sputum and wheezing Cardiovascular: negative for chest pain, dyspnea, lower extremity edema, orthopnea, palpitations and syncope Gastrointestinal: negative for abdominal pain, constipation, diarrhea, melena, nausea and vomiting Genitourinary:negative for dysuria, frequency and hematuria Hematologic/lymphatic: negative for bleeding, easy bruising and lymphadenopathy Musculoskeletal:negative for arthralgias, muscle weakness and stiff joints Neurological: negative for coordination problems, gait problems, headaches and weakness Endocrine: negative for diabetic symptoms including polydipsia, polyuria and weight loss Allergic/Immunologic: negative for anaphylaxis, hay fever and urticaria   Past Medical History  Diagnosis Date  . Alcoholic cirrhosis   . Reflux esophagitis   . Gastroesophageal reflux   . Colon polyps   . CHF (congestive heart failure)   . Hypertension   . History of GI diverticular bleed   . Pneumonia     history of  . Asthma   . Poor circulation   . Anemia   . Blood transfusion   . Sinus problem   . Wears glasses   . Villous adenoma of colon 05/10/11  . PVD (peripheral vascular disease)   . Chronic obstructive pulmonary disease (COPD)   . Cirrhosis   . Anxiety   . Arthritis   . Depression   . Glaucoma   . Gout   . Acute venous embolism and thrombosis of deep vessels of proximal lower  extremity   . Bruises easily   .  Back pain   . Leg swelling   . Abdominal pain   . Shortness of breath   . Cough   . Acquired cyst of kidney   . Sleep apnea     no study done .  Marland Kitchen Diabetes mellitus     insulin dep   . Hepatitis    Past Surgical History  Procedure Date  . Abdominal hysterectomy   . Cataract extraction   . Colonoscopy   . Laparoscopic assissted total colectomy w/ j-pouch 04/22/11  . Ventral hernia repair 06/13/2012    Procedure: LAPAROSCOPIC VENTRAL HERNIA;  Surgeon: Ernestene Mention, MD;  Location: [facility] OR;  Service: General;  Laterality: N/A;  Laparoscopic Assisted Ventral Hernia with Mesh   Social History:  reports that she quit smoking about 5 years ago. She does not have any smokeless tobacco history on file. She reports that she does not drink alcohol or use illicit drugs.  Allergies  Allergen Reactions  . Penicillins Hives   Family History  Problem Relation Age of Onset  . Cancer Brother     heent ca  . Cancer Sister     liver ca    Prior to Admission medications   Medication Sig Start Date End Date Taking? Authorizing Provider  albuterol (PROVENTIL HFA;VENTOLIN HFA) 108 (90 BASE) MCG/ACT inhaler Inhale 2 puffs into the lungs every 6 (six) hours as needed. For shortness of breath   Yes Historical Provider, MD  aspirin 81 MG EC tablet Take 81 mg by mouth daily.     Yes Historical Provider, MD  cilostazol (PLETAL) 100 MG tablet Take 100 mg by mouth 2 (two) times daily before a meal.    Yes Historical Provider, MD  colchicine (COLCRYS) 0.6 MG tablet Take 0.6 mg by mouth daily.   Yes Historical Provider, MD  diclofenac sodium (VOLTAREN) 1 % GEL Apply 1 application topically daily as needed. For leg pain   Yes Historical Provider, MD  diphenhydrAMINE (BENADRYL) 25 mg capsule Take 25 mg by mouth at bedtime as needed. For cough   Yes Historical Provider, MD  ergocalciferol (VITAMIN D2) 50000 UNITS capsule Take 50,000 Units by mouth once a week. On Monday   Yes Historical Provider, MD    fluticasone (FLONASE) 50 MCG/ACT nasal spray Place 2 sprays into the nose daily. 50 mcg /act 2 sprays nasal once a day    Yes Historical Provider, MD  folic acid (FOLVITE) 1 MG tablet Take 1 mg by mouth daily.   Yes Historical Provider, MD  glimepiride (AMARYL) 4 MG tablet Take 4 mg by mouth daily before breakfast.   Yes Historical Provider, MD  Hydrocodone-Acetaminophen 5-300 MG TABS Take 1 tablet by mouth 2 (two) times daily as needed. For pain   Yes Historical Provider, MD  insulin glargine (LANTUS SOLOSTAR) 100 UNIT/ML injection Inject 8 Units into the skin at bedtime.    Yes Historical Provider, MD  lidocaine (LIDODERM) 5 % Place 1 patch onto the skin daily. Remove & Discard patch within 12 hours or as directed by MD    Yes Historical Provider, MD  metoprolol (LOPRESSOR) 50 MG tablet Take 50 mg by mouth 2 (two) times daily.   Yes Historical Provider, MD  montelukast (SINGULAIR) 10 MG tablet Take 10 mg by mouth at bedtime.     Yes Historical Provider, MD  omeprazole (PRILOSEC) 40 MG capsule Take 40 mg by mouth 2 (two) times daily.  Yes Historical Provider, MD  senna (SENOKOT) 8.6 MG tablet Take 1 tablet by mouth daily.     Yes Historical Provider, MD  sitaGLIPtan (JANUVIA) 50 MG tablet Take 50 mg by mouth daily.     Yes Historical Provider, MD  temazepam (RESTORIL) 30 MG capsule Take 30 mg by mouth at bedtime as needed. For sleep   Yes Historical Provider, MD  triamcinolone cream (KENALOG) 0.5 % Apply 1 application topically daily as needed. For legs   Yes Historical Provider, MD  glucose blood test strip 1 each by Other route as needed. Use as instructed    Historical Provider, MD   Physical Exam: Blood pressure 116/64, pulse 68, temperature 97.9 F (36.6 C), temperature source Oral, resp. rate 18, height 5' (1.524 m), weight 73.7 kg (162 lb 7.7 oz), SpO2 99.00%. Filed Vitals:   06/16/12 0217 06/16/12 0500 06/16/12 0819 06/16/12 0923  BP: 107/65 110/62  116/64  Pulse: 72 68    Temp:  97.8 F (36.6 C) 97.9 F (36.6 C)    TempSrc:      Resp: 18 18    Height:      Weight:      SpO2: 92% 92% 99%     General appearance: alert, cooperative and no distress  Head: Normocephalic, without obvious abnormality, atraumatic  Eyes: conjunctivae/corneas clear. PERRL, EOM's intact. Fundi benign.  Nose: Nares normal. Septum midline. Mucosa normal. No drainage or sinus tenderness.  Throat: lips, mucosa, and tongue normal; teeth and gums normal  Neck: Supple, no masses, no cervical lymphadenopathy, no JVD appreciated, no meningeal signs Resp: clear to auscultation bilaterally  Chest wall: no tenderness  Cardio: regular rate and rhythm, S1, S2 normal, no murmur, click, rub or gallop  GI: soft, non-tender; bowel sounds normal; no masses, no organomegaly  Extremities: extremities normal, atraumatic, no cyanosis or edema  Skin: Skin color, texture, turgor normal. No rashes or lesions  Neurologic: Alert and oriented X 3, normal strength and tone. Normal symmetric reflexes. Normal coordination, gait was not testes  Labs on Admission:  Basic Metabolic Panel:  Lab 06/15/12 4782 06/14/12 0436 06/13/12 1650  NA 136 139 --  K 5.1 5.6* --  CL 103 104 --  CO2 25 26 --  GLUCOSE 113* 139* --  BUN 22 21 --  CREATININE 1.23* 1.23* 1.29*  CALCIUM 8.5 8.5 --  MG -- -- --  PHOS -- -- --   Liver Function Tests:  Lab 06/15/12 0445  AST 18  ALT <5  ALKPHOS 67  BILITOT 0.4  PROT 6.1  ALBUMIN 2.3*   No results found for this basename: LIPASE:5,AMYLASE:5 in the last 168 hours No results found for this basename: AMMONIA:5 in the last 168 hours CBC:  Lab 06/15/12 0445 06/14/12 0436 06/13/12 1650  WBC 16.3* 12.2* 15.2*  NEUTROABS -- -- --  HGB 10.2* 10.9* 11.5*  HCT 33.5* 35.5* 36.9  MCV 81.7 81.8 81.3  PLT 236 260 232   Cardiac Enzymes: No results found for this basename: CKTOTAL:5,CKMB:5,CKMBINDEX:5,TROPONINI:5 in the last 168 hours BNP: No components found with this basename:  POCBNP:5 CBG:  Lab 06/16/12 0747 06/15/12 1715 06/15/12 0825 06/14/12 2144 06/14/12 1643  GLUCAP 121* 108* 82 113* 117*    Radiological Exams on Admission: Dg Chest 2 View  06/15/2012  *RADIOLOGY REPORT*  Clinical Data: Asthma.  Hypoxia.  Chest pain and shortness of breath.  CHEST - 2 VIEW  Comparison: Chest x-ray 06/05/2012.  Findings: Lung volumes have decreased.  Worsening bibasilar  opacities compatible with increasing atelectasis and/or consolidation in the lower lobes of the lungs bilaterally. Increasing small bilateral pleural effusions.  There is some crowding of the pulmonary vasculature, accentuated by low lung volumes, without frank pulmonary edema.  Heart size appears borderline enlarged. The patient is rotated to the right on today's exam, resulting in distortion of the mediastinal contours and reduced diagnostic sensitivity and specificity for mediastinal pathology.  Atherosclerosis in the thoracic aorta.  IMPRESSION: 1.  Decreasing lung volumes with worsened bibasilar aeration which may represent areas of atelectasis and/or consolidation, with superimposed small bilateral pleural effusions. 2.  Atherosclerosis.   Original Report Authenticated By: Florencia Reasons, M.D.     EKG: Independently reviewed.   Time spent: 70 minutes  [facility] Pager [phone]  If 7PM-7AM, please contact night-coverage [email or web address] Password [email or web address] 06/16/2012, 10:04 AM

## 2012-06-16 NOTE — Progress Notes (Signed)
Assisted to side of bed with much difficulty Pt had c/o pain was medicated. Encouraged pt to ambulate to bathroom but she refused at this time and used [facility] will continue to monitor. Ilean Skill LPN

## 2012-06-17 ENCOUNTER — Inpatient Hospital Stay (HOSPITAL_COMMUNITY): Payer: PRIVATE HEALTH INSURANCE

## 2012-06-17 DIAGNOSIS — D649 Anemia, unspecified: Secondary | ICD-10-CM

## 2012-06-17 DIAGNOSIS — J45909 Unspecified asthma, uncomplicated: Secondary | ICD-10-CM

## 2012-06-17 DIAGNOSIS — E118 Type 2 diabetes mellitus with unspecified complications: Secondary | ICD-10-CM

## 2012-06-17 LAB — URINE CULTURE: Colony Count: 2000

## 2012-06-17 LAB — BASIC METABOLIC PANEL
BUN: 24 mg/dL — ABNORMAL HIGH (ref 6–23)
Creatinine, Ser: 1.56 mg/dL — ABNORMAL HIGH (ref 0.50–1.10)
GFR calc non Af Amer: 33 mL/min — ABNORMAL LOW (ref >90)
Glucose, Bld: 56 mg/dL — ABNORMAL LOW (ref 70–99)
Potassium: 4.4 mEq/L (ref 3.5–5.1)

## 2012-06-17 LAB — CBC
HCT: 29.1 % — ABNORMAL LOW (ref 36.0–46.0)
Hemoglobin: 8.9 g/dL — ABNORMAL LOW (ref 12.0–15.0)
MCH: 24.9 pg — ABNORMAL LOW (ref 26.0–34.0)
MCHC: 30.6 g/dL (ref 30.0–36.0)
MCV: 81.5 fL (ref 78.0–100.0)

## 2012-06-17 LAB — GLUCOSE, CAPILLARY
Glucose-Capillary: 57 mg/dL — ABNORMAL LOW (ref 70–99)
Glucose-Capillary: 104 mg/dL — ABNORMAL HIGH (ref 70–99)
Glucose-Capillary: 126 mg/dL — ABNORMAL HIGH (ref 70–99)

## 2012-06-17 MED ORDER — ALBUTEROL SULFATE HFA 108 (90 BASE) MCG/ACT IN AERS
2.0000 | INHALATION_SPRAY | Freq: Three times a day (TID) | RESPIRATORY_TRACT | Status: DC
  Administered 2012-06-18 – 2012-06-21 (×7): 2 via RESPIRATORY_TRACT
  Filled 2012-06-17 (×2): qty 6.7

## 2012-06-17 MED ORDER — SODIUM CHLORIDE 0.9 % IV SOLN
INTRAVENOUS | Status: AC
  Administered 2012-06-17: 14:00:00 via INTRAVENOUS

## 2012-06-17 MED ORDER — KETOROLAC TROMETHAMINE 10 MG PO TABS
10.0000 mg | ORAL_TABLET | Freq: Four times a day (QID) | ORAL | Status: DC | PRN
  Administered 2012-06-18 – 2012-06-19 (×2): 10 mg via ORAL
  Filled 2012-06-17 (×4): qty 1

## 2012-06-17 MED ORDER — INSULIN ASPART 100 UNIT/ML ~~LOC~~ SOLN
0.0000 [IU] | Freq: Three times a day (TID) | SUBCUTANEOUS | Status: DC
  Administered 2012-06-17: 1 [IU] via SUBCUTANEOUS
  Administered 2012-06-18 – 2012-06-19 (×2): 2 [IU] via SUBCUTANEOUS
  Administered 2012-06-20 – 2012-06-21 (×4): 1 [IU] via SUBCUTANEOUS

## 2012-06-17 MED ORDER — GLIMEPIRIDE 2 MG PO TABS
2.0000 mg | ORAL_TABLET | Freq: Every day | ORAL | Status: DC
  Filled 2012-06-17 (×2): qty 1

## 2012-06-17 MED ORDER — GLUCOSE 40 % PO GEL
ORAL | Status: AC
  Administered 2012-06-17: 37.5 g
  Filled 2012-06-17: qty 1

## 2012-06-17 MED ORDER — GLUCOSE 40 % PO GEL
ORAL | Status: AC
  Filled 2012-06-17: qty 1

## 2012-06-17 MED ORDER — INSULIN GLARGINE 100 UNIT/ML ~~LOC~~ SOLN: 4.0000 [IU] | Freq: Every day | SUBCUTANEOUS | Status: DC

## 2012-06-17 MED ORDER — ALBUTEROL SULFATE HFA 108 (90 BASE) MCG/ACT IN AERS: 2.0000 | INHALATION_SPRAY | RESPIRATORY_TRACT | Status: DC | PRN

## 2012-06-17 NOTE — Progress Notes (Signed)
4 Days Post-Op  Subjective: Patient evaluated by [facility] yesterday. I greatly appreciate your assessment and management. She received Lasix because of positive fluid balance and history of heart failure. She says she voided a lot but her urine output was not recorded. She is not receiving IV fluids.  She tolerated full liquid diet. She had a bowel movement. She is up to the bathroom but has not ambulated in the hall.  ABG reveals mild acute hypercarbia. PH 7.30, PCO2 48, PO2 56. This is not acceptable for transition to outpatient management. Hopefully this will improve. Questionable pulmonary consult would be in order. We'll defer to medicine.  Objective: Vital signs in last 24 hours: Temp:  [97.9 F (36.6 C)-99.2 F (37.3 C)] 98.5 F (36.9 C) (10/20 0635) Pulse Rate:  [9-107] 9  (10/20 0635) Resp:  [16-18] 18  (10/20 0635) BP: (111-124)/(55-68) 123/62 mmHg (10/20 0635) SpO2:  [90 %-99 %] 92 % (10/20 0635) Last BM Date: 06/16/12  Intake/Output from previous day: 10/19 0701 - 10/20 0700 In: 720 [P.O.:720] Out: 45 [Drains:45] Intake/Output this shift:    General appearance: more alert, oriented. No acute distress. Somewhat Lethargic. Flattened affect. Resp: clear to auscultation anteriorly. Decreased breath sounds bases. No wheeze. GI: abdomen soft, protuberant. Mild tenderness, appropriate. Bowel sounds present. Wounds looked fine. Bowstrings functioning with thin serosanguineous fluid.  Lab Results:  Results for orders placed during the hospital encounter of 06/13/12 (from the past 24 hour(s))  GLUCOSE, CAPILLARY     Status: Abnormal   Collection Time   06/16/12  7:47 AM      Component Value Range   Glucose-Capillary 121 (*) 70 - 99 mg/dL  BLOOD GAS, ARTERIAL     Status: Abnormal   Collection Time   06/16/12 11:40 AM      Component Value Range   O2 Content 2.0     Delivery systems NASAL CANNULA     pH, Arterial 7.305 (*) 7.350 - 7.450   pCO2 arterial 48.3 (*)  35.0 - 45.0 mmHg   pO2, Arterial 56.0 (*) 80.0 - 100.0 mmHg   Bicarbonate 23.3  20.0 - 24.0 mEq/L   TCO2 24.8  0 - 100 mmol/L   Acid-base deficit 2.1 (*) 0.0 - 2.0 mmol/L   O2 Saturation 87.8     Patient temperature 98.6     Collection site RIGHT RADIAL     Drawn by [id number]     Sample type ARTERIAL DRAW     Allens test (pass/fail) PASS  PASS  GLUCOSE, CAPILLARY     Status: Abnormal   Collection Time   06/16/12  1:08 PM      Component Value Range   Glucose-Capillary 126 (*) 70 - 99 mg/dL  URINALYSIS, ROUTINE W REFLEX MICROSCOPIC     Status: Normal   Collection Time   06/16/12  4:52 PM      Component Value Range   Color, Urine YELLOW  YELLOW   APPearance CLEAR  CLEAR   Specific Gravity, Urine 1.007  1.005 - 1.030   pH 5.0  5.0 - 8.0   Glucose, UA NEGATIVE  NEGATIVE mg/dL   Hgb urine dipstick NEGATIVE  NEGATIVE   Bilirubin Urine NEGATIVE  NEGATIVE   Ketones, ur NEGATIVE  NEGATIVE mg/dL   Protein, ur NEGATIVE  NEGATIVE mg/dL   Urobilinogen, UA 0.2  0.0 - 1.0 mg/dL   Nitrite NEGATIVE  NEGATIVE   Leukocytes, UA NEGATIVE  NEGATIVE  GLUCOSE, CAPILLARY     Status: Abnormal  Collection Time   06/16/12  4:58 PM      Component Value Range   Glucose-Capillary 164 (*) 70 - 99 mg/dL  GLUCOSE, CAPILLARY     Status: Normal   Collection Time   06/16/12 11:37 PM      Component Value Range   Glucose-Capillary 72  70 - 99 mg/dL     Studies/Results: @RISRSLT24 @     . albuterol  2 puff Inhalation Q6H  . cilostazol  100 mg Oral BID AC  . colchicine  0.6 mg Oral Daily  . fluticasone  2 spray Each Nare Daily  . folic acid  1 mg Oral Daily  . furosemide  40 mg Intravenous Once  . glimepiride  4 mg Oral QAC breakfast  . heparin  5,000 Units Subcutaneous Q8H  . insulin aspart  0-15 Units Subcutaneous TID WC  . insulin glargine  8 Units Subcutaneous QHS  . lidocaine  1 patch Transdermal Q24H  . linagliptin  5 mg Oral Daily  . metoprolol  50 mg Oral BID  . montelukast  10 mg Oral QHS    . pantoprazole  40 mg Oral Daily  . pneumococcal 23 valent vaccine  0.5 mL Intramuscular Tomorrow-1000  . pneumococcal 23 valent vaccine  0.5 mL Intramuscular Tomorrow-1000  . polyethylene glycol  17 g Oral Daily  . senna  1 tablet Oral Daily     Assessment/Plan: s/p Procedure(s): LAPAROSCOPIC VENTRAL HERNIA INSERTION OF MESH LYSIS OF ADHESION  POD #4. Stable. Significantly deconditioned.  Mobilize more PT consult  Ileus resolving, advance diet 2 carb modified.  miralax ordered    [name], history OSA, COPD.Concerned that she is not mobilizing her secretions and is at risk for pneumonia.  Acute hyper car be a, mild. Not symptomatic. Question whether this is consistent with her sleep apnea. Albuterol inhaler every 6 hours  Flutter valve  Incentive spirometry  Ambulate  Discontinue narcotics. Substitute toradol. Consult [facility].  Question whether pulmonary consult would be helpful. We'll defer to [facility] on this.  Multiple comorbidities:   Obesity  Congestive heart failure followed by [name].Received Lasix yesterday. Subjectively she did diurese. Have stressed need for strict intake and output record with [name]. Obstructive sleep apnea  COPD  Compensated cirrhosis  Hypertension  Coronary artery disease-Bone daily metoprolol 50 mg.  Diabetes mellitus on Lantus insulin, SSI, amaryl - good control  Peripheral vascular disease.  History laparoscopic right colectomy for villous adenoma  History abdominal hysterectomy.   Disp: Case management requested. Not a lot of social support at home.  LOS: 4 days    [name] 06/17/2012  . .prob

## 2012-06-17 NOTE — Progress Notes (Addendum)
[facility]                                                                                Patient Demographics  [name], is a 66 y.o. female  YNW:[id number]  MVH:[id number]  DOB - July 13, 1946  Admit date - 06/13/2012  Admitting Physician Regina Mention, MD  Outpatient Primary MD for the patient is Regina German, MD  LOS - 4   No chief complaint on file.       Assessment & Plan    Lethargy and SOB  Likely combination of narcotic, CO2 retention, now completely resolved, minimize use of narcotics, appears back to baseline. Outpatient followup for sleep apnea with pulmonology.  Patient has now has no shortness of breath, pulse ox currently on 1 L nasal cannula oxygen is 93%, she has no chest pain, her tachycardia is much improved, chest x-ray does suggest atelectasis in the setting of recent surgery have encouraged patient to use incentive parameter every hour.     Diabetes mellitus type 2  -Uncontrolled DM 2 with hemoglobin A1c of 8.0 which correlate to a mean plasma glucose of 183.  Have reduced her oral hypoglycemic along with Lantus as she had an even tough hypoglycemia on 06/17/2012 in the morning  Have reduced sliding scale insulin we'll continue to monitor sugars closely    Lab Results  Component Value Date   HGBA1C 8.0* 06/13/2012    CBG (last 3)   Basename 06/17/12 0929 06/17/12 0835 06/17/12 0808  GLUCAP 104* 75 57*      Acute renal failure on CKD stage III  Creatinine slightly elevated than baseline, she did receive some Lasix, clinically appears dry, no rales or edema on exam, chest x-ray shows chronic pleural effusions which actually have improved since the last chest x-ray, will give her gentle IV fluids for hydration repeat BMP in the morning.     Chronic diastolic CHF currently compensated  Gentle IV fluids for now due to acute renal insufficiency.          Time Spent in minutes   35   Antibiotics     Anti-infectives     Start     Dose/Rate Route Frequency Ordered Stop   06/13/12 1645   ceFAZolin (ANCEF) IVPB 2 g/50 mL premix        2 g 100 mL/hr over 30 Minutes Intravenous Every 8 hours 06/13/12 1447 06/14/12 0816   06/13/12 0930   ceFAZolin (ANCEF) IVPB 2 g/50 mL premix        2 g 100 mL/hr over 30 Minutes Intravenous  Once 06/13/12 0918 06/13/12 0849          Scheduled Meds:   . albuterol  2 puff Inhalation Q6H  . cilostazol  100 mg Oral BID AC  . colchicine  0.6 mg Oral Daily  . fluticasone  2 spray Each Nare Daily  . folic acid  1 mg Oral Daily  . glimepiride  2 mg Oral QAC breakfast  . heparin  5,000 Units Subcutaneous Q8H  . insulin aspart  0-9 Units Subcutaneous TID WC  . insulin glargine  4 Units Subcutaneous QHS  . lidocaine  1  patch Transdermal Q24H  . linagliptin  5 mg Oral Daily  . metoprolol  50 mg Oral BID  . montelukast  10 mg Oral QHS  . pantoprazole  40 mg Oral Daily  . pneumococcal 23 valent vaccine  0.5 mL Intramuscular Tomorrow-1000  . polyethylene glycol  17 g Oral Daily  . senna  1 tablet Oral Daily  . DISCONTD: glimepiride  4 mg Oral QAC breakfast  . DISCONTD: insulin aspart  0-15 Units Subcutaneous TID WC  . DISCONTD: insulin glargine  8 Units Subcutaneous QHS   Continuous Infusions:   . sodium chloride     PRN Meds:.influenza  inactive virus vaccine, ketorolac, ondansetron (ZOFRAN) IV, ondansetron, temazepam, triamcinolone cream, DISCONTD: HYDROcodone-acetaminophen   DVT Prophylaxis    Heparin   Lab Results  Component Value Date   PLT 255 06/17/2012      [name] on 06/17/2012 at 1:50 PM  Between 7am to 7pm - Pager - [phone]  After 7pm go to [email or web address] - password [email or web address]  And look for the night coverage person covering for me after hours  [facility] Office  [phone]    Subjective:   [name] today has, No headache, No chest pain, No abdominal pain - No Nausea, No new weakness  tingling or numbness, No Cough - SOB.    Objective:   Filed Vitals:   06/17/12 0911 06/17/12 1010 06/17/12 1346 06/17/12 1348  BP: 124/64 134/82 145/70   Pulse: 110 121 93   Temp:  98.4 F (36.9 C) 99.3 F (37.4 C)   TempSrc:  Axillary Oral   Resp:  18 18   Height:      Weight:      SpO2:  100% 89% 93%    Wt Readings from Last 3 Encounters:  06/13/12 73.7 kg (162 lb 7.7 oz)  06/13/12 73.7 kg (162 lb 7.7 oz)  03/06/12 70.67 kg (155 lb 12.8 oz)     Intake/Output Summary (Last 24 hours) at 06/17/12 1351 Last data filed at 06/17/12 0900  Gross per 24 hour  Intake    600 ml  Output    325 ml  Net    275 ml    Exam Awake Alert, Oriented X 3, No new F.N deficits, Normal affect Bon[place]arte.AT,PERRAL Supple Neck,No JVD, No cervical lymphadenopathy appriciated.  Symmetrical Chest wall movement, Good air movement bilaterally, CTAB RRR,No Gallops,Rubs or new Murmurs, No Parasternal Heave +ve B.Sounds, Abd Soft, Non tender, No organomegaly appriciated, No rebound - guarding or rigidity. No Cyanosis, Clubbing or edema, No new Rash or bruise     Data Review   Micro Results Recent Results (from the past 240 hour(s))  CULTURE, BLOOD (ROUTINE X 2)     Status: Normal (Preliminary result)   Collection Time   06/16/12 10:31 AM      Component Value Range Status Comment   Specimen Description BLOOD HAND LEFT   Final    Special Requests BOTTLES DRAWN AEROBIC ONLY 7CC   Final    Culture  Setup Time 06/16/2012 18:52   Final    Culture     Final    Value:        BLOOD CULTURE RECEIVED NO GROWTH TO DATE CULTURE WILL BE HELD FOR 5 DAYS BEFORE ISSUING A FINAL NEGATIVE REPORT   Report Status PENDING   Incomplete   CULTURE, BLOOD (ROUTINE X 2)     Status: Normal (Preliminary result)   Collection Time   06/16/12 10:38 AM  Component Value Range Status Comment   Specimen Description BLOOD ARM RIGHT   Final    Special Requests     Final    Value: BOTTLES DRAWN AEROBIC AND ANAEROBIC RED 5CC,  BLUE 10CC   Culture  Setup Time 06/16/2012 18:52   Final    Culture     Final    Value:        BLOOD CULTURE RECEIVED NO GROWTH TO DATE CULTURE WILL BE HELD FOR 5 DAYS BEFORE ISSUING A FINAL NEGATIVE REPORT   Report Status PENDING   Incomplete     Radiology Reports Dg Chest 2 View  06/17/2012  *RADIOLOGY REPORT*  Clinical Data: Shortness of breath and weakness.  CHEST - 2 VIEW  Comparison: PA and lateral chest 06/15/2012.  Findings: Lung volumes are low.  Small bilateral pleural effusions and associated basilar atelectasis persist but appear slightly decreased.  No pneumothorax is identified.  Heart size is upper normal.  IMPRESSION: Slight decrease in small bilateral pleural effusions.  No other change.   Original Report Authenticated By: Bernadene Bell. Maricela Curet, [name].    Dg Chest 2 View  06/15/2012  *RADIOLOGY REPORT*  Clinical Data: Asthma.  Hypoxia.  Chest pain and shortness of breath.  CHEST - 2 VIEW  Comparison: Chest x-ray 06/05/2012.  Findings: Lung volumes have decreased.  Worsening bibasilar opacities compatible with increasing atelectasis and/or consolidation in the lower lobes of the lungs bilaterally. Increasing small bilateral pleural effusions.  There is some crowding of the pulmonary vasculature, accentuated by low lung volumes, without frank pulmonary edema.  Heart size appears borderline enlarged. The patient is rotated to the right on today's exam, resulting in distortion of the mediastinal contours and reduced diagnostic sensitivity and specificity for mediastinal pathology.  Atherosclerosis in the thoracic aorta.  IMPRESSION: 1.  Decreasing lung volumes with worsened bibasilar aeration which may represent areas of atelectasis and/or consolidation, with superimposed small bilateral pleural effusions. 2.  Atherosclerosis.   Original Report Authenticated By: Florencia Reasons, [name].    Dg Chest 2 View  06/05/2012  *RADIOLOGY REPORT*  Clinical Data: Cough and congestion; emphysema  CHEST  - 2 VIEW  Comparison: May 11, 2011  Findings: There is underlying emphysema.  There is scarring in the bases.  There is no edema or consolidation.  The heart size is normal.  The pulmonary vascularity reflects underlying emphysema.  There is thoracic levoscoliosis.  There is anterior wedging of several mid thoracic vertebral bodies, stable  .  IMPRESSION: Underlying emphysema.  Bibasilar scarring.  No edema or consolidation.   Original Report Authenticated By: Arvin Collard. WOODRUFF III, [name].     CBC  Lab 06/17/12 0635 06/15/12 0445 06/14/12 0436 06/13/12 1650  WBC 12.9* 16.3* 12.2* 15.2*  HGB 8.9* 10.2* 10.9* 11.5*  HCT 29.1* 33.5* 35.5* 36.9  PLT 255 236 260 232  MCV 81.5 81.7 81.8 81.3  MCH 24.9* 24.9* 25.1* 25.3*  MCHC 30.6 30.4 30.7 31.2  RDW 15.2 15.6* 15.2 15.0  LYMPHSABS -- -- -- --  MONOABS -- -- -- --  EOSABS -- -- -- --  BASOSABS -- -- -- --  BANDABS -- -- -- --    Chemistries   Lab 06/17/12 0635 06/15/12 0445 06/14/12 0436 06/13/12 1650  NA 134* 136 139 --  K 4.4 5.1 5.6* --  CL 101 103 104 --  CO2 24 25 26  --  GLUCOSE 56* 113* 139* --  BUN 24* 22 21 --  CREATININE 1.56* 1.23* 1.23* 1.29*  CALCIUM 8.6 8.5 8.5 --  MG -- -- -- --  AST -- 18 -- --  ALT -- <5 -- --  ALKPHOS -- 67 -- --  BILITOT -- 0.4 -- --   ------------------------------------------------------------------------------------------------------------------ estimated creatinine clearance is 31.4 ml/min (by C-G formula based on Cr of 1.56). ------------------------------------------------------------------------------------------------------------------ No results found for this basename: HGBA1C:2 in the last 72 hours ------------------------------------------------------------------------------------------------------------------ No results found for this basename: CHOL:2,HDL:2,LDLCALC:2,TRIG:2,CHOLHDL:2,LDLDIRECT:2 in the last 72  hours ------------------------------------------------------------------------------------------------------------------ No results found for this basename: TSH,T4TOTAL,FREET3,T3FREE,THYROIDAB in the last 72 hours ------------------------------------------------------------------------------------------------------------------ No results found for this basename: VITAMINB12:2,FOLATE:2,FERRITIN:2,TIBC:2,IRON:2,RETICCTPCT:2 in the last 72 hours  Coagulation profile No results found for this basename: INR:5,PROTIME:5 in the last 168 hours  No results found for this basename: DDIMER:2 in the last 72 hours  Cardiac Enzymes No results found for this basename: CK:3,CKMB:3,TROPONINI:3,MYOGLOBIN:3 in the last 168 hours ------------------------------------------------------------------------------------------------------------------ No components found with this basename: POCBNP:3

## 2012-06-17 NOTE — Progress Notes (Signed)
Patient's blood sugar at 0750 was 43.  Hypoglycemic protocol initiated.  Patient given orange juice to drink, and crackers with peanut butter.  MD notified.  Blood sugar at 0815 was 57.  Another snack and OJ was given to patient.  Patient alert and oriented, ambulated to bathroom with assistance.  Will continue to monitor.

## 2012-06-18 DIAGNOSIS — I5032 Chronic diastolic (congestive) heart failure: Secondary | ICD-10-CM

## 2012-06-18 DIAGNOSIS — J449 Chronic obstructive pulmonary disease, unspecified: Secondary | ICD-10-CM

## 2012-06-18 DIAGNOSIS — R079 Chest pain, unspecified: Secondary | ICD-10-CM

## 2012-06-18 DIAGNOSIS — I509 Heart failure, unspecified: Secondary | ICD-10-CM

## 2012-06-18 LAB — BASIC METABOLIC PANEL
Calcium: 8.7 mg/dL (ref 8.4–10.5)
GFR calc Af Amer: 47 mL/min — ABNORMAL LOW (ref >90)
GFR calc non Af Amer: 41 mL/min — ABNORMAL LOW (ref >90)
Sodium: 134 mEq/L — ABNORMAL LOW (ref 135–145)

## 2012-06-18 LAB — GLUCOSE, CAPILLARY
Glucose-Capillary: 94 mg/dL (ref 70–99)
Glucose-Capillary: 70 mg/dL (ref 70–99)
Glucose-Capillary: 118 mg/dL — ABNORMAL HIGH (ref 70–99)
Glucose-Capillary: 137 mg/dL — ABNORMAL HIGH (ref 70–99)

## 2012-06-18 LAB — CBC
MCH: 24.9 pg — ABNORMAL LOW (ref 26.0–34.0)
MCHC: 30.7 g/dL (ref 30.0–36.0)
Platelets: 271 10*3/uL (ref 150–400)

## 2012-06-18 LAB — TROPONIN I: Troponin I: <0.3 ng/mL (ref <0.30)

## 2012-06-18 MED ORDER — FUROSEMIDE 10 MG/ML IJ SOLN
40.0000 mg | Freq: Two times a day (BID) | INTRAMUSCULAR | Status: DC
  Administered 2012-06-18 – 2012-06-20 (×4): 40 mg via INTRAVENOUS
  Filled 2012-06-18 (×6): qty 4

## 2012-06-18 MED ORDER — ASPIRIN 81 MG PO CHEW
81.0000 mg | CHEWABLE_TABLET | Freq: Every day | ORAL | Status: DC
  Administered 2012-06-18 – 2012-06-21 (×4): 81 mg via ORAL
  Filled 2012-06-18 (×4): qty 1

## 2012-06-18 MED ORDER — ISOSORBIDE MONONITRATE ER 30 MG PO TB24
30.0000 mg | ORAL_TABLET | Freq: Every day | ORAL | Status: DC
  Administered 2012-06-18 – 2012-06-21 (×4): 30 mg via ORAL
  Filled 2012-06-18 (×4): qty 1

## 2012-06-18 MED ORDER — CILOSTAZOL 50 MG PO TABS
50.0000 mg | ORAL_TABLET | Freq: Two times a day (BID) | ORAL | Status: DC
  Filled 2012-06-18 (×3): qty 1

## 2012-06-18 MED ORDER — SODIUM CHLORIDE 0.9 % IV SOLN
INTRAVENOUS | Status: DC
  Administered 2012-06-18: 11:00:00 via INTRAVENOUS

## 2012-06-18 MED ORDER — DILTIAZEM HCL 25 MG/5ML IV SOLN
10.0000 mg | Freq: Once | INTRAVENOUS | Status: AC
  Administered 2012-06-19: 10 mg via INTRAVENOUS
  Filled 2012-06-18: qty 5

## 2012-06-18 NOTE — Progress Notes (Signed)
Patient complains of chest pain; described as sharp pain to her left chest, while in the bathroom.  BP 167/90, pulse 106, O2 90% on room air.  Orthostatic BP done:  160/ 88 lying, 152/79 standing.  MD notified.  EKG done.  Currently resting in bed.

## 2012-06-18 NOTE — Care Management Note (Signed)
  Page 2 of 2   06/21/2012     10:38:41 AM   CARE MANAGEMENT NOTE 06/21/2012  Patient:  [name], [name]   Account Number:  [id number]  Date Initiated:  06/15/2012  Documentation initiated by:  Donn Pierini  Subjective/Objective Assessment:   Pt admitted s/p hernia repair with Laparoscopic lysis of adhesions     Action/Plan:   PTA [place] lived [place], with a homehealth aide   Anticipated DC Date:  06/21/2012   Anticipated DC Plan:  HOME W HOME HEALTH SERVICES      DC Planning Services  CM consult      Choice offered to / List presented to:  C-1 Patient   DME arranged  OXYGEN      DME agency  [facility]     HH arranged  HH-2 PT  HH-1 RN      [facility] agency  [facility]   Status of service:  Completed, signed off Medicare Important Message given?   (If response is "NO", the following Medicare IM given date fields will be blank) Date Medicare IM given:   Date Additional Medicare IM given:    Discharge Disposition:  HOME W HOME HEALTH SERVICES  Per UR Regulation:  Reviewed for med. necessity/level of care/duration of stay  If discussed at Long Length of Stay Meetings, dates discussed:    Comments:  06-21-12 Plan to discharge patient to home today. MD ordered home oxygen if needed. Patient told bedside nurse yesterday that she does not want home oxygen . Spoke with patient this morning , she has changed her mind , she would like to have home oxygen .  Aurea Graff RN ( bedside nurse ) aware and will document sats.   Ronny Flurry RN BSN [phone]  06-20-12 Patient has rolling walker and wheelchair at home .   Bedside nurse will check sats to see if home oxgen is needed  Ronny Flurry RN BSN [phone]   06/15/12- 1000- Donn Pierini RN, BSN [phone] Pt tx to 6N, PT eval ordered and pending. [place] NCM to follow for potential d/c needs.

## 2012-06-18 NOTE — Progress Notes (Signed)
Physical Therapy Treatment Patient Details Name: PABLO MATHURIN MRN: [id number] DOB: 1946-05-02 Today's Date: 06/18/2012 Time: 6606-3016 PT Time Calculation (min): 30 min  PT Assessment / Plan / Recommendation Comments on Treatment Session  Upon arrival pt c/o feeling lightheaded & has been feeling this way since she was given flu shot by her RN.  Spoke with RN who reports pts BP was just checked ~15 mins before therapist arrival & BP WNL.  At end of session, pt requesting to use bathroom.  In the bathroom, pt c/o chest pain.  assisted pt back to bed.  Notified RN who immediately came to room to assess pt.  Due to pt living alone & not at mod (I) level with mobility, she would benefit from SNF prior to d/c home to maximize safety, (I) with functional mobilty.  If pt refuses SNF she will need 24 hr (A)/(S) & HHPT.      Follow Up Recommendations  Post acute inpatient     Does the patient have the potential to tolerate intense rehabilitation  No, Recommend SNF  Barriers to Discharge        Equipment Recommendations       Recommendations for Other Services    Frequency Min 3X/week   Plan Discharge plan needs to be updated    Precautions / Restrictions Precautions Precautions: Fall Restrictions Weight Bearing Restrictions: No   Pertinent Vitals/Pain C/o "lightheadiness" & c/o chest pain at end of session.  RN notified.      Mobility  Bed Mobility Bed Mobility: Supine to Sit;Sit to Supine;Sitting - Scoot to Edge of Bed Supine to Sit: 3: Mod assist;HOB elevated;With rails Sitting - Scoot to Edge of Bed: 4: Min assist Sit to Supine: 3: Mod assist;HOB flat Details for Bed Mobility Assistance: Cues for sequencing & technique.  Encouraged logrolling to decrease discomfort on stomach.   Transfers Transfers: Sit to Stand;Stand to Sit Sit to Stand: 4: Min guard;With upper extremity assist;From bed;From chair/3-in-1;With armrests Stand to Sit: 4: Min guard;With upper extremity  assist;With armrests;To chair/3-in-1;To bed Details for Transfer Assistance: Cues for hand placement & safe body positioning before sitting.  With initial standing pt c/o lightheadiness, requested to sit, attempted to stand again & pt reports "im ok".   Ambulation/Gait Ambulation/Gait Assistance: 4: Min guard Ambulation Distance (Feet): 120 Feet Assistive device: Rolling walker Ambulation/Gait Assistance Details: Guarding for safety due to pt with c/o lightheadiness upon standing & stating "im hot" however, as she began to increase distance she reports feeling better.  Cues for safe use of RW & body positioning inside RW.   Gait Pattern: Step-through pattern;Decreased stride length Stairs: No Wheelchair Mobility Wheelchair Mobility: No      PT Goals Acute Rehab PT Goals Time For Goal Achievement: 06/29/12 Potential to Achieve Goals: Good Pt will go Supine/Side to Sit: with modified independence;with HOB 0 degrees PT Goal: Supine/Side to Sit - Progress: Not met Pt will go Sit to Stand: with modified independence PT Goal: Sit to Stand - Progress: Progressing toward goal Pt will Transfer Bed to Chair/Chair to Bed: with modified independence PT Transfer Goal: Bed to Chair/Chair to Bed - Progress: Progressing toward goal Pt will Ambulate: >150 feet;with modified independence;with least restrictive assistive device PT Goal: Ambulate - Progress: Progressing toward goal  Visit Information  Last PT Received On: 06/18/12 Assistance Needed: +1    Subjective Data  Subjective: "I just don't feel right" at end of session.  Pt c/o chest pain.  RN in to assess.  Patient Stated Goal: Home I, I need to do for myself   Cognition  Overall Cognitive Status: Appears within functional limits for tasks assessed/performed Arousal/Alertness: Awake/alert Orientation Level: Appears intact for tasks assessed Behavior During Session: [facility] for tasks performed Cognition - Other Comments: Throughout session  pt with c/o "not feeling right", c/o chest pain at end of session (RN notified), but pt required max questioning to try to get a understanding of how she was feeling due to she gives poor descriptions of symptoms.       Balance     End of Session PT - End of Session Equipment Utilized During Treatment: Gait belt Patient left: in bed;with call bell/phone within reach;with family/visitor present Nurse Communication: Mobility status     Verdell Face, [place] [phone] 06/18/2012

## 2012-06-18 NOTE — Progress Notes (Addendum)
[facility]                                                                                Patient Demographics  [name], is a 66 y.o. female  NWG:[id number]  [id number]:[id number]  DOB - [birth year]  Admit date - 06/13/2012  Admitting Physician Ernestene Mention, MD  Outpatient Primary MD for the patient is Dorrene German, MD  LOS - 5   No chief complaint on file.       Assessment & Plan    Lethargy and SOB   Likely combination of narcotic, CO2 retention, now completely resolved, minimize use of narcotics, appears back to baseline. Outpatient followup for sleep apnea with pulmonology. She has history of COPD for which she uses nebulizers at home, will recommend outpatient pulmonary followup after discharge.  Patient has now has no shortness of breath, pulse ox currently on room air is 92%, she has no chest pain, her tachycardia is much improved, chest x-ray does suggest atelectasis in the setting of recent surgery have encouraged patient to use incentive parameter every hour.  Patient is deconditioned and will benefit from continued PT OT.     Diabetes mellitus type 2  -Uncontrolled DM 2 with hemoglobin A1c of 8.0 which correlate to a mean plasma glucose of 183.  Currently sugars running on the low side due to combination of decreased by mouth intake, strict car modified diet, acute renal failure causing Amaryl and insulin to hang for longer time. Stopped Lantus and Amaryl Have reduced sliding scale insulin at low dose along with Tragenta and monitor sugars closely    Lab Results  Component Value Date   HGBA1C 8.0* 06/13/2012    CBG (last 3)   Basename 06/18/12 1010 06/18/12 0800 06/17/12 2258  GLUCAP 118* 70 94      Acute renal failure on CKD stage III  Creatinine slightly elevated than baseline, she did receive some Lasix, clinically appears dry, no rales or edema on exam, chest x-ray shows chronic pleural effusions which actually have  improved since the last chest x-ray, improving with IV fluids continue gentle IV fluids for a few more hours. Repeat BMP in the morning.     Chronic diastolic CHF currently compensated  Gentle IV fluids for now due to acute renal insufficiency.     Chest pain around 2.45pm 06-18-12 lasting few seconds with exertion (severely deconditioned)   Likely non cardiac, will check EKG, Echo, Cycle Trop, ASA, now pain free, Cards input.       Time Spent in minutes   35   Antibiotics    Anti-infectives     Start     Dose/Rate Route Frequency Ordered Stop   06/13/12 1645   ceFAZolin (ANCEF) IVPB 2 g/50 mL premix        2 g 100 mL/hr over 30 Minutes Intravenous Every 8 hours 06/13/12 1447 06/14/12 0816   06/13/12 0930   ceFAZolin (ANCEF) IVPB 2 g/50 mL premix        2 g 100 mL/hr over 30 Minutes Intravenous  Once 06/13/12 0918 06/13/12 0849          Scheduled Meds:    .  albuterol  2 puff Inhalation TID  . cilostazol  100 mg Oral BID AC  . colchicine  0.6 mg Oral Daily  . dextrose      . dextrose      . fluticasone  2 spray Each Nare Daily  . folic acid  1 mg Oral Daily  . heparin  5,000 Units Subcutaneous Q8H  . insulin aspart  0-9 Units Subcutaneous TID WC  . lidocaine  1 patch Transdermal Q24H  . linagliptin  5 mg Oral Daily  . metoprolol  50 mg Oral BID  . montelukast  10 mg Oral QHS  . pantoprazole  40 mg Oral Daily  . pneumococcal 23 valent vaccine  0.5 mL Intramuscular Tomorrow-1000  . polyethylene glycol  17 g Oral Daily  . senna  1 tablet Oral Daily  . DISCONTD: albuterol  2 puff Inhalation Q6H  . DISCONTD: glimepiride  2 mg Oral QAC breakfast  . DISCONTD: insulin aspart  0-15 Units Subcutaneous TID WC  . DISCONTD: insulin glargine  4 Units Subcutaneous QHS   Continuous Infusions:    . sodium chloride 100 mL/hr at 06/17/12 1407  . sodium chloride 75 mL/hr at 06/18/12 1038   PRN Meds:.albuterol, influenza  inactive virus vaccine, ketorolac, ondansetron  (ZOFRAN) IV, ondansetron, temazepam, triamcinolone cream   DVT Prophylaxis    Heparin   Lab Results  Component Value Date   PLT 271 06/18/2012      Susa Raring K M.D on 06/18/2012 at 11:43 AM  Between 7am to 7pm - Pager - [phone]  After 7pm go to [email or web address] - password [email or web address]  And look for the night coverage person covering for me after hours  [facility] Office  [phone]    Subjective:   [name] today has, No headache, No chest pain, No abdominal pain - No Nausea, No new weakness tingling or numbness, No Cough - SOB.    Objective:   Filed Vitals:   06/18/12 0252 06/18/12 0524 06/18/12 1014 06/18/12 1019  BP: 145/70 137/65    Pulse: 93 91    Temp: 99 F (37.2 C) 98.9 F (37.2 C)    TempSrc:      Resp: 17 16    Height:      Weight:      SpO2: 95% 92% 82% 92%    Wt Readings from Last 3 Encounters:  06/13/12 73.7 kg (162 lb 7.7 oz)  06/13/12 73.7 kg (162 lb 7.7 oz)  03/06/12 70.67 kg (155 lb 12.8 oz)     Intake/Output Summary (Last 24 hours) at 06/18/12 1143 Last data filed at 06/18/12 0900  Gross per 24 hour  Intake   1575 ml  Output   1006 ml  Net    569 ml    Exam Awake Alert, Oriented X 3, No new F.N deficits, Normal affect [place].AT,PERRAL Supple Neck,No JVD, No cervical lymphadenopathy appriciated.  Symmetrical Chest wall movement, Good air movement bilaterally, CTAB RRR,No Gallops,Rubs or new Murmurs, No Parasternal Heave +ve B.Sounds, Abd Soft, Non tender, No organomegaly appriciated, No rebound - guarding or rigidity. No Cyanosis, Clubbing or edema, No new Rash or bruise     Data Review   Micro Results Recent Results (from the past 240 hour(s))  CULTURE, BLOOD (ROUTINE X 2)     Status: Normal (Preliminary result)   Collection Time   06/16/12 10:31 AM      Component Value Range Status Comment   Specimen Description BLOOD HAND LEFT   Final  Special Requests BOTTLES DRAWN AEROBIC ONLY 7CC   Final    Culture   Setup Time 06/16/2012 18:52   Final    Culture     Final    Value:        BLOOD CULTURE RECEIVED NO GROWTH TO DATE CULTURE WILL BE HELD FOR 5 DAYS BEFORE ISSUING A FINAL NEGATIVE REPORT   Report Status PENDING   Incomplete   CULTURE, BLOOD (ROUTINE X 2)     Status: Normal (Preliminary result)   Collection Time   06/16/12 10:38 AM      Component Value Range Status Comment   Specimen Description BLOOD ARM RIGHT   Final    Special Requests     Final    Value: BOTTLES DRAWN AEROBIC AND ANAEROBIC RED 5CC, BLUE 10CC   Culture  Setup Time 06/16/2012 18:52   Final    Culture     Final    Value:        BLOOD CULTURE RECEIVED NO GROWTH TO DATE CULTURE WILL BE HELD FOR 5 DAYS BEFORE ISSUING A FINAL NEGATIVE REPORT   Report Status PENDING   Incomplete   URINE CULTURE     Status: Normal   Collection Time   06/16/12  4:52 PM      Component Value Range Status Comment   Specimen Description URINE, CLEAN CATCH   Final    Special Requests NONE   Final    Culture  Setup Time 06/16/2012 22:00   Final    Colony Count 2,000 COLONIES/ML   Final    Culture INSIGNIFICANT GROWTH   Final    Report Status 06/17/2012 FINAL   Final     Radiology Reports Dg Chest 2 View  06/17/2012  *RADIOLOGY REPORT*  Clinical Data: Shortness of breath and weakness.  CHEST - 2 VIEW  Comparison: PA and lateral chest 06/15/2012.  Findings: Lung volumes are low.  Small bilateral pleural effusions and associated basilar atelectasis persist but appear slightly decreased.  No pneumothorax is identified.  Heart size is upper normal.  IMPRESSION: Slight decrease in small bilateral pleural effusions.  No other change.   Original Report Authenticated By: Bernadene Bell. Maricela Curet, M.D.    Dg Chest 2 View  06/15/2012  *RADIOLOGY REPORT*  Clinical Data: Asthma.  Hypoxia.  Chest pain and shortness of breath.  CHEST - 2 VIEW  Comparison: Chest x-ray 06/05/2012.  Findings: Lung volumes have decreased.  Worsening bibasilar opacities compatible with  increasing atelectasis and/or consolidation in the lower lobes of the lungs bilaterally. Increasing small bilateral pleural effusions.  There is some crowding of the pulmonary vasculature, accentuated by low lung volumes, without frank pulmonary edema.  Heart size appears borderline enlarged. The patient is rotated to the right on today's exam, resulting in distortion of the mediastinal contours and reduced diagnostic sensitivity and specificity for mediastinal pathology.  Atherosclerosis in the thoracic aorta.  IMPRESSION: 1.  Decreasing lung volumes with worsened bibasilar aeration which may represent areas of atelectasis and/or consolidation, with superimposed small bilateral pleural effusions. 2.  Atherosclerosis.   Original Report Authenticated By: Florencia Reasons, M.D.    Dg Chest 2 View  06/05/2012  *RADIOLOGY REPORT*  Clinical Data: Cough and congestion; emphysema  CHEST - 2 VIEW  Comparison: May 11, 2011  Findings: There is underlying emphysema.  There is scarring in the bases.  There is no edema or consolidation.  The heart size is normal.  The pulmonary vascularity reflects underlying emphysema.  There is  thoracic levoscoliosis.  There is anterior wedging of several mid thoracic vertebral bodies, stable  .  IMPRESSION: Underlying emphysema.  Bibasilar scarring.  No edema or consolidation.   Original Report Authenticated By: Arvin Collard. WOODRUFF III, M.D.     CBC  Lab 06/18/12 0520 06/17/12 0635 06/15/12 0445 06/14/12 0436 06/13/12 1650  WBC 10.7* 12.9* 16.3* 12.2* 15.2*  HGB 8.6* 8.9* 10.2* 10.9* 11.5*  HCT 28.0* 29.1* 33.5* 35.5* 36.9  PLT 271 255 236 260 232  MCV 80.9 81.5 81.7 81.8 81.3  MCH 24.9* 24.9* 24.9* 25.1* 25.3*  MCHC 30.7 30.6 30.4 30.7 31.2  RDW 15.4 15.2 15.6* 15.2 15.0  LYMPHSABS -- -- -- -- --  MONOABS -- -- -- -- --  EOSABS -- -- -- -- --  BASOSABS -- -- -- -- --  BANDABS -- -- -- -- --    Chemistries   Lab 06/18/12 0520 06/17/12 0635 06/15/12 0445  06/14/12 0436 06/13/12 1650  NA 134* 134* 136 139 --  K 4.4 4.4 5.1 5.6* --  CL 104 101 103 104 --  CO2 24 24 25 26  --  GLUCOSE 72 56* 113* 139* --  BUN 18 24* 22 21 --  CREATININE 1.32* 1.56* 1.23* 1.23* 1.29*  CALCIUM 8.7 8.6 8.5 8.5 --  MG -- -- -- -- --  AST -- -- 18 -- --  ALT -- -- <5 -- --  ALKPHOS -- -- 67 -- --  BILITOT -- -- 0.4 -- --   ------------------------------------------------------------------------------------------------------------------ estimated creatinine clearance is 37.1 ml/min (by C-G formula based on Cr of 1.32). ------------------------------------------------------------------------------------------------------------------ No results found for this basename: HGBA1C:2 in the last 72 hours ------------------------------------------------------------------------------------------------------------------ No results found for this basename: CHOL:2,HDL:2,LDLCALC:2,TRIG:2,CHOLHDL:2,LDLDIRECT:2 in the last 72 hours ------------------------------------------------------------------------------------------------------------------ No results found for this basename: TSH,T4TOTAL,FREET3,T3FREE,THYROIDAB in the last 72 hours ------------------------------------------------------------------------------------------------------------------ No results found for this basename: VITAMINB12:2,FOLATE:2,FERRITIN:2,TIBC:2,IRON:2,RETICCTPCT:2 in the last 72 hours  Coagulation profile No results found for this basename: INR:5,PROTIME:5 in the last 168 hours  No results found for this basename: DDIMER:2 in the last 72 hours  Cardiac Enzymes No results found for this basename: CK:3,CKMB:3,TROPONINI:3,MYOGLOBIN:3 in the last 168 hours ------------------------------------------------------------------------------------------------------------------ No components found with this basename: POCBNP:3

## 2012-06-18 NOTE — Progress Notes (Signed)
Agree PT treatment note and updated d/c plans.  [place], [place] DPT [phone]

## 2012-06-18 NOTE — Progress Notes (Signed)
5 Days Post-Op  Subjective: Ate some regular food yesterday but not a lot. Having bowel movements. No nausea.ambulated around nursing station he wants, which is some progress.  Medicine following for CO2 retention, sleep apnea, congestive heart failure, diabetes, acute renal failure and CKD.   Objective: Vital signs in last 24 hours: Temp:  [98.4 F (36.9 C)-99.3 F (37.4 C)] 98.9 F (37.2 C) (10/21 0524) Pulse Rate:  [9-121] 91  (10/21 0524) Resp:  [16-18] 16  (10/21 0524) BP: (123-145)/(62-82) 137/65 mmHg (10/21 0524) SpO2:  [89 %-100 %] 92 % (10/21 0524) Last BM Date: 06/17/12  Intake/Output from previous day: 10/20 0701 - 10/21 0700 In: 1575 [P.O.:360; I.V.:1200] Out: 1256 [Urine:1250; Drains:6] Intake/Output this shift: Total I/O In: 900 [I.V.:900] Out: 706 [Urine:700; Drains:6]    Exam: General appearance: lethargic but arousable, alert, oriented, in no distress. Resp: no increased work of breathing. SaO2 93% on 2 L, clear anteriorly, no wheeze, decreased breath sound bases.. GI: abdomen protuberant and obese, hypoactive bowel sounds, soft, wound okay. Drains functioning, low-volume serosanguineous drainage. Hernia repair appears intact.    Lab Results:  Results for orders placed during the hospital encounter of 06/13/12 (from the past 24 hour(s))  CBC     Status: Abnormal   Collection Time   06/17/12  6:35 AM      Component Value Range   WBC 12.9 (*) 4.0 - 10.5 K/uL   RBC 3.57 (*) 3.87 - 5.11 MIL/uL   Hemoglobin 8.9 (*) 12.0 - 15.0 g/dL   HCT 16.1 (*) 09.6 - 04.5 %   MCV 81.5  78.0 - 100.0 fL   MCH 24.9 (*) 26.0 - 34.0 pg   MCHC 30.6  30.0 - 36.0 g/dL   RDW 40.9  81.1 - 91.4 %   Platelets 255  150 - 400 K/uL  BASIC METABOLIC PANEL     Status: Abnormal   Collection Time   06/17/12  6:35 AM      Component Value Range   Sodium 134 (*) 135 - 145 mEq/L   Potassium 4.4  3.5 - 5.1 mEq/L   Chloride 101  96 - 112 mEq/L   CO2 24  19 - 32 mEq/L   Glucose, Bld  56 (*) 70 - 99 mg/dL   BUN 24 (*) 6 - 23 mg/dL   Creatinine, Ser 7.82 (*) 0.50 - 1.10 mg/dL   Calcium 8.6  8.4 - 95.6 mg/dL   GFR calc non Af Amer 33 (*) >90 mL/min   GFR calc Af Amer 39 (*) >90 mL/min  GLUCOSE, CAPILLARY     Status: Abnormal   Collection Time   06/17/12  7:43 AM      Component Value Range   Glucose-Capillary 43 (*) 70 - 99 mg/dL   Comment 1 Notify RN    GLUCOSE, CAPILLARY     Status: Abnormal   Collection Time   06/17/12  8:08 AM      Component Value Range   Glucose-Capillary 57 (*) 70 - 99 mg/dL   Comment 1 Notify RN    GLUCOSE, CAPILLARY     Status: Normal   Collection Time   06/17/12  8:35 AM      Component Value Range   Glucose-Capillary 75  70 - 99 mg/dL   Comment 1 Notify RN    GLUCOSE, CAPILLARY     Status: Abnormal   Collection Time   06/17/12  9:29 AM      Component Value Range   Glucose-Capillary  104 (*) 70 - 99 mg/dL   Comment 1 Notify RN    GLUCOSE, CAPILLARY     Status: Abnormal   Collection Time   06/17/12 12:46 PM      Component Value Range   Glucose-Capillary 126 (*) 70 - 99 mg/dL   Comment 1 Notify RN    GLUCOSE, CAPILLARY     Status: Abnormal   Collection Time   06/17/12  5:12 PM      Component Value Range   Glucose-Capillary 148 (*) 70 - 99 mg/dL   Comment 1 Notify RN    GLUCOSE, CAPILLARY     Status: Abnormal   Collection Time   06/17/12 10:01 PM      Component Value Range   Glucose-Capillary 63 (*) 70 - 99 mg/dL   Comment 1 Notify RN     Comment 2 Documented in Chart    GLUCOSE, CAPILLARY     Status: Abnormal   Collection Time   06/17/12 10:37 PM      Component Value Range   Glucose-Capillary 60 (*) 70 - 99 mg/dL   Comment 1 Notify RN     Comment 2 Documented in Chart    GLUCOSE, CAPILLARY     Status: Normal   Collection Time   06/17/12 10:58 PM      Component Value Range   Glucose-Capillary 94  70 - 99 mg/dL     Studies/Results: @RISRSLT24 @     . albuterol  2 puff Inhalation TID  . cilostazol  100 mg Oral  BID AC  . colchicine  0.6 mg Oral Daily  . dextrose      . dextrose      . fluticasone  2 spray Each Nare Daily  . folic acid  1 mg Oral Daily  . glimepiride  2 mg Oral QAC breakfast  . heparin  5,000 Units Subcutaneous Q8H  . insulin aspart  0-9 Units Subcutaneous TID WC  . insulin glargine  4 Units Subcutaneous QHS  . lidocaine  1 patch Transdermal Q24H  . linagliptin  5 mg Oral Daily  . metoprolol  50 mg Oral BID  . montelukast  10 mg Oral QHS  . pantoprazole  40 mg Oral Daily  . pneumococcal 23 valent vaccine  0.5 mL Intramuscular Tomorrow-1000  . polyethylene glycol  17 g Oral Daily  . senna  1 tablet Oral Daily  . DISCONTD: albuterol  2 puff Inhalation Q6H  . DISCONTD: glimepiride  4 mg Oral QAC breakfast  . DISCONTD: insulin aspart  0-15 Units Subcutaneous TID WC  . DISCONTD: insulin glargine  8 Units Subcutaneous QHS     Assessment/Plan: s/p Procedure(s): LAPAROSCOPIC VENTRAL HERNIA INSERTION OF MESH LYSIS OF ADHESION  POD #5. Stable. Significantly deconditioned.  Mobilize more  PT involved  Ileus resolving, tolerating  diet  carb modified, although not eating a lot   Atalectasis, history OSA, COPD.Concerned that she is not mobilizing her secretions and is at risk for pneumonia. .  Albuterol inhaler every 6 hours  Flutter valve  Incentive spirometry  Ambulate  Discontinue narcotics. Substitute toradol.  [facility]. Managing pulmonary, CHF, DM Question whether pulmonary consult would be helpful. We'll defer to [facility] on this.   Multiple comorbidities:  Obesity  Congestive heart failure followed by Nanetta Batty. Obstructive sleep apnea  COPD  Compensated cirrhosis  Hypertension  Coronary artery disease-Bone daily metoprolol 50 mg.  Diabetes mellitus some episodes of hypoglycemia, so Lantus insulin reduced...held last night, SSI, amaryl  Peripheral vascular disease.  History laparoscopic right colectomy for villous adenoma  History  abdominal hysterectomy.  Disp: abdominal and GI issues are resolving. Will need to remain hospitalized until medical problems are stabilized, however     LOS: 5 days    Alger Kerstein M. Derrell Lolling, M.D., [facility] General and Minimally invasive Surgery Breast and Colorectal Surgery Office:   [phone] Pager:   [phone]  06/18/2012  . .prob

## 2012-06-18 NOTE — Consult Note (Signed)
CARDIOLOGY CONSULT NOTE  Patient ID: LEISLY SHECK MRN: [id number] DOB/AGE: 1946/08/17 66 y.o.  Admit date: 06/13/2012 Reason for Consultation: chest pain  HPI:  66 yo with history of PAD, chronic diastolic CHF, CKD, COPD, and type II diabetes is now hospitalized after surgical repair of multiple incarcerated ventral incisional hernias and lysis of adhesions.  Cardiology was consulted this afternoon because of chest pain.  Earlier in her post-op course, the hospitalist service was consulted because of lethargy/CO2 retention.  This has improved with minimization of narcotics.  She was diuresed earlier in her stay but more recently has been given IV fluid due to a mildly elevated creatinine (has baseline CKD).  She is net positive almost 6 liter total now.   Today, patient developed sharp central chest pain while in the bathroom after a walk down the hall.   The chest pain lasted about 5 minutes and was associated with flushing.  She had another episode shortly afterwards lasting 2-3 minutes while she was lying in bed.  Initial cardiac enzymes were negative and ECG was normal.  She denies history of prior MI.  She does not remember ever having a stress test.    She does have a history of PAD and says that she had a peripheral intervention around 2007.  She does have symptoms consistent with claudication involving both legs (primarily calves) and does not walk very far at baseline.  She denies significant exertional dyspnea at baseline but is not very active because of leg pain.   Review of systems complete and found to be negative unless listed above in HPI  Past Medical History: 1. Type II diabetes. 2. PAD: peripheral intervention back in 2007 (she thinks) by Dr. Nadara Eaton 3. CKD 4. OSA: Not on CPAP 5. Chronic diastolic CHF: Echo this admission with EF 55-60%, grade I diastolic dysfunction.  6. HTN 7. Gout 8. GERD 9. ETOH cirrhosis 10. H/o diverticular bleeding 11. H/o DVT  Family  History  Problem Relation Age of Onset  . Cancer Brother     heent ca  . Cancer Sister     liver ca    History   Social History  . Marital Status: Single    Spouse Name: N/A    Number of Children: N/A  . Years of Education: N/A   Occupational History  . Not on file.   Social History Main Topics  . Smoking status: Former Smoker    Quit date: 11/07/2006  . Smokeless tobacco: Not on file  . Alcohol Use: No     stopped 2000  . Drug Use: No  . Sexually Active:    Other Topics Concern  . Not on file   Social History Narrative  . No narrative on file     Prescriptions prior to admission  Medication Sig Dispense Refill  . albuterol (PROVENTIL HFA;VENTOLIN HFA) 108 (90 BASE) MCG/ACT inhaler Inhale 2 puffs into the lungs every 6 (six) hours as needed. For shortness of breath      . aspirin 81 MG EC tablet Take 81 mg by mouth daily.        . cilostazol (PLETAL) 100 MG tablet Take 100 mg by mouth 2 (two) times daily before a meal.       . colchicine (COLCRYS) 0.6 MG tablet Take 0.6 mg by mouth daily.      . diclofenac sodium (VOLTAREN) 1 % GEL Apply 1 application topically daily as needed. For leg pain      .  diphenhydrAMINE (BENADRYL) 25 mg capsule Take 25 mg by mouth at bedtime as needed. For cough      . ergocalciferol (VITAMIN D2) 50000 UNITS capsule Take 50,000 Units by mouth once a week. On Monday      . fluticasone (FLONASE) 50 MCG/ACT nasal spray Place 2 sprays into the nose daily. 50 mcg /act 2 sprays nasal once a day       . folic acid (FOLVITE) 1 MG tablet Take 1 mg by mouth daily.      Marland Kitchen glimepiride (AMARYL) 4 MG tablet Take 4 mg by mouth daily before breakfast.      . Hydrocodone-Acetaminophen 5-300 MG TABS Take 1 tablet by mouth 2 (two) times daily as needed. For pain      . insulin glargine (LANTUS SOLOSTAR) 100 UNIT/ML injection Inject 8 Units into the skin at bedtime.       . lidocaine (LIDODERM) 5 % Place 1 patch onto the skin daily. Remove & Discard patch within  12 hours or as directed by MD       . metoprolol (LOPRESSOR) 50 MG tablet Take 50 mg by mouth 2 (two) times daily.      . montelukast (SINGULAIR) 10 MG tablet Take 10 mg by mouth at bedtime.        Marland Kitchen omeprazole (PRILOSEC) 40 MG capsule Take 40 mg by mouth 2 (two) times daily.       Marland Kitchen senna (SENOKOT) 8.6 MG tablet Take 1 tablet by mouth daily.        . sitaGLIPtan (JANUVIA) 50 MG tablet Take 50 mg by mouth daily.        . temazepam (RESTORIL) 30 MG capsule Take 30 mg by mouth at bedtime as needed. For sleep      . triamcinolone cream (KENALOG) 0.5 % Apply 1 application topically daily as needed. For legs      . glucose blood test strip 1 each by Other route as needed. Use as instructed        Physical exam Blood pressure 152/79, pulse 96, temperature 99 F (37.2 C), temperature source Oral, resp. rate 22, height 5' (1.524 m), weight 162 lb 7.7 oz (73.7 kg), SpO2 99.00%. General: NAD Neck: JVP 10-12 cm, no thyromegaly or thyroid nodule.  Lungs: Clear to auscultation bilaterally with normal respiratory effort. CV: Nondisplaced PMI.  Heart regular S1/S2, no S3/S4, 1/6 SEM RUSB.  Trace ankle edema.  No carotid bruit.  Normal pedal pulses.  Abdomen: Soft, nontender, no hepatosplenomegaly, no distention.  Skin: Intact without lesions or rashes.  Neurologic: Alert and oriented x 3.  Psych: Normal affect. Extremities: No clubbing or cyanosis.  HEENT: Normal.   Labs:   Lab Results  Component Value Date   WBC 10.7* 06/18/2012   HGB 8.6* 06/18/2012   HCT 28.0* 06/18/2012   MCV 80.9 06/18/2012   PLT 271 06/18/2012    Lab 06/18/12 0520 06/15/12 0445  NA 134* --  K 4.4 --  CL 104 --  CO2 24 --  BUN 18 --  CREATININE 1.32* --  CALCIUM 8.7 --  PROT -- 6.1  BILITOT -- 0.4  ALKPHOS -- 67  ALT -- <5  AST -- 18  GLUCOSE 72 --   Lab Results  Component Value Date   CKTOTAL 59 05/13/2010   CKMB 1.6 05/13/2010   TROPONINI <0.30 06/18/2012    Lab Results  Component Value Date   CHOL   Value: 56  ATP III CLASSIFICATION:  <200     mg/dL   Desirable  578-469  mg/dL   Borderline High  >=629    mg/dL   High 01/24/4131   CHOL  Value: 69        ATP III CLASSIFICATION:  <200     mg/dL   Desirable  440-102  mg/dL   Borderline High  >=725    mg/dL   High 3/66/4403   No results found for this basename: HDL   No results found for this basename: LDLCALC   Lab Results  Component Value Date   TRIG 79 04/26/2007   TRIG 72 04/23/2007   TRIG 107 04/22/2007   No results found for this basename: CHOLHDL   No results found for this basename: LDLDIRECT     EKG: NSR, normal   ASSESSMENT AND PLAN:   66 yo with history of PAD, COPD, type II diabetes, chronic diastolic CHF, and repair of ventral hernia this admission.  From a cardiac perspective, she had 2 short episodes of chest pain today.  She also appears volume overloaded on exam.  1. Chest pain: 2 short episodes today.  No evidence for ischemia by ECG.  Initial cardiac enzymes negative.  She does have significant risk for CAD given PAD history.   - Continue to cycle cardiac enzymes.  - Continue ASA 81, metoprolol 50 mg bid, and add Imdur 30 mg daily.  - If she does not have further significant symptoms, would probably do functional study as outpatient.  - She should be on a statin: check lipids in am to decide which statin and what dose.  2. Acute on chronic diastolic CHF: She is volume overloaded on exam.  She is almost 6 L net positive in the hospital. Creatinine appears to be at or near her baseline.   - Stop IV fluid. - Start Lasix 40 mg IV bid for now and follow I/Os and creatinine closely.  - Check BNP.  3. CKD: Creatinine stable.  Follow closely with diuresis.   Patient was unable initially to remember if she sees a cardiologist.  However, by the end of my talk with her she remembered the location of the office and it sounds like she has seen Dr. Allyson Sabal.  I spoke with Dr. Royann Shivers tonight.  We can follow her while she is in  the hospital and she can see Dr. Allyson Sabal after discharge.   Marca Ancona 06/18/2012

## 2012-06-18 NOTE — Progress Notes (Signed)
  Echocardiogram 2D Echocardiogram has been performed.  Georgian Co 06/18/2012, 3:55 PM

## 2012-06-19 ENCOUNTER — Inpatient Hospital Stay (HOSPITAL_COMMUNITY): Payer: PRIVATE HEALTH INSURANCE

## 2012-06-19 DIAGNOSIS — M79609 Pain in unspecified limb: Secondary | ICD-10-CM

## 2012-06-19 DIAGNOSIS — M7989 Other specified soft tissue disorders: Secondary | ICD-10-CM

## 2012-06-19 DIAGNOSIS — J45909 Unspecified asthma, uncomplicated: Secondary | ICD-10-CM

## 2012-06-19 LAB — BASIC METABOLIC PANEL
BUN: 15 mg/dL (ref 6–23)
CO2: 25 mEq/L (ref 19–32)
Chloride: 102 mEq/L (ref 96–112)
Creatinine, Ser: 1.17 mg/dL — ABNORMAL HIGH (ref 0.50–1.10)
Glucose, Bld: 169 mg/dL — ABNORMAL HIGH (ref 70–99)
Potassium: 4 mEq/L (ref 3.5–5.1)

## 2012-06-19 LAB — LIPID PANEL
Cholesterol: 123 mg/dL (ref 0–200)
Total CHOL/HDL Ratio: 4 RATIO
VLDL: 28 mg/dL (ref 0–40)

## 2012-06-19 LAB — GLUCOSE, CAPILLARY
Glucose-Capillary: 112 mg/dL — ABNORMAL HIGH (ref 70–99)
Glucose-Capillary: 120 mg/dL — ABNORMAL HIGH (ref 70–99)

## 2012-06-19 MED ORDER — TECHNETIUM TC 99M DIETHYLENETRIAME-PENTAACETIC ACID: 40.0000 | Freq: Once | INTRAVENOUS | Status: AC | PRN

## 2012-06-19 MED ORDER — OXYCODONE-ACETAMINOPHEN 5-325 MG PO TABS
1.0000 | ORAL_TABLET | Freq: Four times a day (QID) | ORAL | Status: DC | PRN
  Administered 2012-06-20 (×2): 1 via ORAL
  Administered 2012-06-21: 2 via ORAL
  Filled 2012-06-19: qty 2
  Filled 2012-06-19 (×2): qty 1

## 2012-06-19 MED ORDER — TECHNETIUM TO 99M ALBUMIN AGGREGATED
6.0000 | Freq: Once | INTRAVENOUS | Status: AC | PRN
  Administered 2012-06-19: 6 via INTRAVENOUS

## 2012-06-19 MED ORDER — METOPROLOL SUCCINATE ER 50 MG PO TB24
75.0000 mg | ORAL_TABLET | Freq: Every day | ORAL | Status: DC
  Administered 2012-06-19 – 2012-06-21 (×3): 75 mg via ORAL
  Filled 2012-06-19 (×4): qty 1

## 2012-06-19 MED ORDER — ATORVASTATIN CALCIUM 20 MG PO TABS
20.0000 mg | ORAL_TABLET | Freq: Every day | ORAL | Status: DC
  Administered 2012-06-19 – 2012-06-20 (×2): 20 mg via ORAL
  Filled 2012-06-19 (×4): qty 1

## 2012-06-19 NOTE — Progress Notes (Signed)
[facility]                                                                                Patient Demographics  [name], is a 66 y.o. female  ONG:[id number]  GMW:[id number]  DOB - 27-Mar-1946  Admit date - 06/13/2012  Admitting Physician Ernestene Mention, MD  Outpatient Primary MD for the patient is Dorrene German, MD  LOS - 6   No chief complaint on file.       Assessment & Plan    Lethargy and SOB   Likely combination of narcotic, CO2 retention, now completely resolved, minimize use of narcotics, appears back to baseline. Outpatient followup for sleep apnea with pulmonology. She has history of COPD for which she uses nebulizers at home, will recommend outpatient pulmonary followup after discharge.  Patient has now has no shortness of breath, pulse ox currently on room air is 92%, she has no chest pain, her tachycardia is much improved, chest x-ray does suggest atelectasis in the setting of recent surgery have encouraged patient to use incentive parameter every hour. However in the light of episodes of tachycardia with minimal exertion will check lower extremity duplex and VQ scan to rule out PE, will like to avoid giving her IV contrast do to recent acute renal insufficiency with creatinine peaked at 1.6. Echo appears stable with no RV strain. My clinical suspicion of PE is very low.  Patient is deconditioned and will benefit from continued PT OT.     Diabetes mellitus type 2  -Uncontrolled DM 2 with hemoglobin A1c of 8.0 which correlate to a mean plasma glucose of 183.  Currently sugars running on the low side due to combination of decreased by mouth intake, strict Carb modified diet, acute renal failure causing Amaryl and insulin to hang for longer time. Stopped Lantus and Amaryl Have reduced sliding scale insulin at low dose along with Tragenta and monitor sugars now stable   Lab Results  Component Value Date   HGBA1C 8.0* 06/13/2012     CBG (last 3)   Basename 06/19/12 0824 06/18/12 2206 06/18/12 1723  GLUCAP 112* 137* 110*      Acute renal failure on CKD stage III  Creatinine peaked at 1.6 and improved with IV fluids, at this point IV fluids have been stopped by cardiology and patient has been placed on Lasix, monitor BMP.     Acute on Chronic diastolic CHF currently compensated  Per cardiology patient getting Lasix   Chest pain around 2.45pm 06-18-12 lasting few seconds with exertion (severely deconditioned) , episode of SVT  Likely non cardiac, will check EKG, stable Echo, -ve Trop, continue ASA, B blocker, now pain free, Cards input appreciated discussed with Dr. Shirlee Latch on 06/19/2012       Time Spent in minutes   35   Antibiotics    Anti-infectives     Start     Dose/Rate Route Frequency Ordered Stop   06/13/12 1645   ceFAZolin (ANCEF) IVPB 2 g/50 mL premix        2 g 100 mL/hr over 30 Minutes Intravenous Every 8 hours 06/13/12 1447 06/14/12 0816   06/13/12 0930   ceFAZolin (  ANCEF) IVPB 2 g/50 mL premix        2 g 100 mL/hr over 30 Minutes Intravenous  Once 06/13/12 0918 06/13/12 0849          Scheduled Meds:    . albuterol  2 puff Inhalation TID  . aspirin  81 mg Oral Daily  . atorvastatin  20 mg Oral q1800  . colchicine  0.6 mg Oral Daily  . diltiazem  10 mg Intravenous Once  . fluticasone  2 spray Each Nare Daily  . folic acid  1 mg Oral Daily  . furosemide  40 mg Intravenous BID  . heparin  5,000 Units Subcutaneous Q8H  . insulin aspart  0-9 Units Subcutaneous TID WC  . isosorbide mononitrate  30 mg Oral Daily  . lidocaine  1 patch Transdermal Q24H  . linagliptin  5 mg Oral Daily  . metoprolol succinate  75 mg Oral Daily  . montelukast  10 mg Oral QHS  . pantoprazole  40 mg Oral Daily  . pneumococcal 23 valent vaccine  0.5 mL Intramuscular Tomorrow-1000  . polyethylene glycol  17 g Oral Daily  . senna  1 tablet Oral Daily  . DISCONTD: cilostazol  100 mg Oral BID AC   . DISCONTD: cilostazol  50 mg Oral BID AC  . DISCONTD: metoprolol  50 mg Oral BID   Continuous Infusions:    . DISCONTD: sodium chloride 75 mL/hr at 06/18/12 1038   PRN Meds:.albuterol, ketorolac, ondansetron (ZOFRAN) IV, ondansetron, temazepam, triamcinolone cream, DISCONTD: influenza  inactive virus vaccine   DVT Prophylaxis    Heparin   Lab Results  Component Value Date   PLT 271 06/18/2012      Susa Raring K M.D on 06/19/2012 at 10:59 AM  Between 7am to 7pm - Pager - [phone]  After 7pm go to [email or web address] - password [email or web address]  And look for the night coverage person covering for me after hours  [facility] Office  [phone]    Subjective:   Desirre Eickhoff today has, No headache, No chest pain, No abdominal pain - No Nausea, No new weakness tingling or numbness, No Cough - SOB.    Objective:   Filed Vitals:   06/18/12 2321 06/19/12 0024 06/19/12 0152 06/19/12 0631  BP: 145/68 119/56 98/67 128/66  Pulse: 140 110 107 105  Temp:   99 F (37.2 C) 99.4 F (37.4 C)  TempSrc:   Oral Oral  Resp:   16 17  Height:      Weight:      SpO2:   95% 92%    Wt Readings from Last 3 Encounters:  06/13/12 73.7 kg (162 lb 7.7 oz)  06/13/12 73.7 kg (162 lb 7.7 oz)  03/06/12 70.67 kg (155 lb 12.8 oz)     Intake/Output Summary (Last 24 hours) at 06/19/12 1059 Last data filed at 06/19/12 0850  Gross per 24 hour  Intake    878 ml  Output    475 ml  Net    403 ml    Exam Awake Alert, Oriented X 3, No new F.N deficits, Normal affect [place].AT,PERRAL Supple Neck,No JVD, No cervical lymphadenopathy appriciated.  Symmetrical Chest wall movement, Good air movement bilaterally, CTAB RRR,No Gallops,Rubs or new Murmurs, No Parasternal Heave +ve B.Sounds, Abd Soft, Non tender, No organomegaly appriciated, No rebound - guarding or rigidity. No Cyanosis, Clubbing or edema, No new Rash or bruise     Data Review   Micro Results Recent Results (from the past  240 hour(s))  CULTURE, BLOOD (ROUTINE X 2)     Status: Normal (Preliminary result)   Collection Time   06/16/12 10:31 AM      Component Value Range Status Comment   Specimen Description BLOOD HAND LEFT   Final    Special Requests BOTTLES DRAWN AEROBIC ONLY 7CC   Final    Culture  Setup Time 06/16/2012 18:52   Final    Culture     Final    Value:        BLOOD CULTURE RECEIVED NO GROWTH TO DATE CULTURE WILL BE HELD FOR 5 DAYS BEFORE ISSUING A FINAL NEGATIVE REPORT   Report Status PENDING   Incomplete   CULTURE, BLOOD (ROUTINE X 2)     Status: Normal (Preliminary result)   Collection Time   06/16/12 10:38 AM      Component Value Range Status Comment   Specimen Description BLOOD ARM RIGHT   Final    Special Requests     Final    Value: BOTTLES DRAWN AEROBIC AND ANAEROBIC RED 5CC, BLUE 10CC   Culture  Setup Time 06/16/2012 18:52   Final    Culture     Final    Value:        BLOOD CULTURE RECEIVED NO GROWTH TO DATE CULTURE WILL BE HELD FOR 5 DAYS BEFORE ISSUING A FINAL NEGATIVE REPORT   Report Status PENDING   Incomplete   URINE CULTURE     Status: Normal   Collection Time   06/16/12  4:52 PM      Component Value Range Status Comment   Specimen Description URINE, CLEAN CATCH   Final    Special Requests NONE   Final    Culture  Setup Time 06/16/2012 22:00   Final    Colony Count 2,000 COLONIES/ML   Final    Culture INSIGNIFICANT GROWTH   Final    Report Status 06/17/2012 FINAL   Final     Radiology Reports Dg Chest 2 View  06/17/2012  *RADIOLOGY REPORT*  Clinical Data: Shortness of breath and weakness.  CHEST - 2 VIEW  Comparison: PA and lateral chest 06/15/2012.  Findings: Lung volumes are low.  Small bilateral pleural effusions and associated basilar atelectasis persist but appear slightly decreased.  No pneumothorax is identified.  Heart size is upper normal.  IMPRESSION: Slight decrease in small bilateral pleural effusions.  No other change.   Original Report Authenticated By:  Bernadene Bell. Maricela Curet, M.D.    Dg Chest 2 View  06/15/2012  *RADIOLOGY REPORT*  Clinical Data: Asthma.  Hypoxia.  Chest pain and shortness of breath.  CHEST - 2 VIEW  Comparison: Chest x-ray 06/05/2012.  Findings: Lung volumes have decreased.  Worsening bibasilar opacities compatible with increasing atelectasis and/or consolidation in the lower lobes of the lungs bilaterally. Increasing small bilateral pleural effusions.  There is some crowding of the pulmonary vasculature, accentuated by low lung volumes, without frank pulmonary edema.  Heart size appears borderline enlarged. The patient is rotated to the right on today's exam, resulting in distortion of the mediastinal contours and reduced diagnostic sensitivity and specificity for mediastinal pathology.  Atherosclerosis in the thoracic aorta.  IMPRESSION: 1.  Decreasing lung volumes with worsened bibasilar aeration which may represent areas of atelectasis and/or consolidation, with superimposed small bilateral pleural effusions. 2.  Atherosclerosis.   Original Report Authenticated By: Florencia Reasons, M.D.    Dg Chest 2 View  06/05/2012  *RADIOLOGY REPORT*  Clinical Data: Cough and congestion;  emphysema  CHEST - 2 VIEW  Comparison: May 11, 2011  Findings: There is underlying emphysema.  There is scarring in the bases.  There is no edema or consolidation.  The heart size is normal.  The pulmonary vascularity reflects underlying emphysema.  There is thoracic levoscoliosis.  There is anterior wedging of several mid thoracic vertebral bodies, stable  .  IMPRESSION: Underlying emphysema.  Bibasilar scarring.  No edema or consolidation.   Original Report Authenticated By: Arvin Collard. WOODRUFF III, M.D.     CBC  Lab 06/18/12 0520 06/17/12 0635 06/15/12 0445 06/14/12 0436 06/13/12 1650  WBC 10.7* 12.9* 16.3* 12.2* 15.2*  HGB 8.6* 8.9* 10.2* 10.9* 11.5*  HCT 28.0* 29.1* 33.5* 35.5* 36.9  PLT 271 255 236 260 232  MCV 80.9 81.5 81.7 81.8 81.3  MCH  24.9* 24.9* 24.9* 25.1* 25.3*  MCHC 30.7 30.6 30.4 30.7 31.2  RDW 15.4 15.2 15.6* 15.2 15.0  LYMPHSABS -- -- -- -- --  MONOABS -- -- -- -- --  EOSABS -- -- -- -- --  BASOSABS -- -- -- -- --  BANDABS -- -- -- -- --    Chemistries   Lab 06/19/12 0240 06/18/12 0520 06/17/12 0635 06/15/12 0445 06/14/12 0436  NA 137 134* 134* 136 139  K 4.0 4.4 4.4 5.1 5.6*  CL 102 104 101 103 104  CO2 25 24 24 25 26   GLUCOSE 169* 72 56* 113* 139*  BUN 15 18 24* 22 21  CREATININE 1.17* 1.32* 1.56* 1.23* 1.23*  CALCIUM 8.8 8.7 8.6 8.5 8.5  MG -- -- -- -- --  AST -- -- -- 18 --  ALT -- -- -- <5 --  ALKPHOS -- -- -- 67 --  BILITOT -- -- -- 0.4 --   ------------------------------------------------------------------------------------------------------------------ estimated creatinine clearance is 41.8 ml/min (by C-G formula based on Cr of 1.17). ------------------------------------------------------------------------------------------------------------------ No results found for this basename: HGBA1C:2 in the last 72 hours ------------------------------------------------------------------------------------------------------------------  [facility] 06/19/12 0240  CHOL 123  HDL 31*  LDLCALC 64  TRIG 161  CHOLHDL 4.0  LDLDIRECT --   ------------------------------------------------------------------------------------------------------------------ No results found for this basename: TSH,T4TOTAL,FREET3,T3FREE,THYROIDAB in the last 72 hours ------------------------------------------------------------------------------------------------------------------ No results found for this basename: VITAMINB12:2,FOLATE:2,FERRITIN:2,TIBC:2,IRON:2,RETICCTPCT:2 in the last 72 hours  Coagulation profile No results found for this basename: INR:5,PROTIME:5 in the last 168 hours  No results found for this basename: DDIMER:2 in the last 72 hours  Cardiac Enzymes  Lab 06/19/12 0825 06/19/12 0217 06/18/12 2107  CKMB --  -- --  TROPONINI <0.30 <0.30 <0.30  MYOGLOBIN -- -- --   ------------------------------------------------------------------------------------------------------------------ No components found with this basename: POCBNP:3

## 2012-06-19 NOTE — Progress Notes (Signed)
VASCULAR LAB PRELIMINARY  PRELIMINARY  PRELIMINARY  PRELIMINARY  Bilateral lower extremity venous duplex completed.    Preliminary report:  Bilateral:  No evidence of DVT, superficial thrombosis, or Baker's Cyst.   [name], RVS 06/19/2012, 12:01 PM

## 2012-06-19 NOTE — Progress Notes (Signed)
PT Cancellation Note  Patient Details Name: [name] MRN: [id number] DOB: 10-Aug-1946   Cancelled Treatment:    Reason Eval/Treat Not Completed: Patient at procedure or test/unavailable   Greggory Stallion 06/19/2012, 12:18 PM

## 2012-06-19 NOTE — Progress Notes (Signed)
6 Days Post-Op  Subjective: Stable and alert. Tolerating diet reasonably well. Having bowel movements. Minimal abdominal pain.drains functioning, output and minimal, serosanguineous.  Had an episode of chest pain yesterday. Intermittant tachycardia.  I appreciate cardiology evaluation by Dr. Marca Ancona. Chest pain has resolved. Initial set of enzymes negative. Echocardiogram performed. Her outpatient cardiologist is Dr. Nanetta Batty.  She was given extra IV Lasix yesterday for volume overload and IV fluids stopped.  [facility] following for CO2 retention, sleep apnea, congestive heart failure, diabetes and CKD..  Objective: Vital signs in last 24 hours: Temp:  [99 F (37.2 C)] 99 F (37.2 C) (10/22 0152) Pulse Rate:  [96-140] 107  (10/22 0152) Resp:  [16-22] 16  (10/22 0152) BP: (98-167)/(56-90) 98/67 mmHg (10/22 0152) SpO2:  [82 %-99 %] 95 % (10/22 0152) Last BM Date: 06/18/12  Intake/Output from previous day: 10/21 0701 - 10/22 0700 In: 870 [P.O.:480; I.V.:375] Out: 365 [Urine:350; Drains:15] Intake/Output this shift: Total I/O In: -  Out: 15 [Drains:15]    Exam: General appearance: mental status at baseline.  alert. In no distress. Verbalizes normally. Does have trouble remembering some things. GI: abdominal exam unremarkable. Protuberant but soft. Active bowel sounds. Wounds looked good. JP drains serosanguineous.    Lab Results:  Results for orders placed during the hospital encounter of 06/13/12 (from the past 24 hour(s))  GLUCOSE, CAPILLARY     Status: Normal   Collection Time   06/18/12  8:00 AM      Component Value Range   Glucose-Capillary 70  70 - 99 mg/dL   Comment 1 Notify RN    GLUCOSE, CAPILLARY     Status: Abnormal   Collection Time   06/18/12 10:10 AM      Component Value Range   Glucose-Capillary 118 (*) 70 - 99 mg/dL   Comment 1 Notify RN    GLUCOSE, CAPILLARY     Status: Abnormal   Collection Time   06/18/12 12:27 PM      Component  Value Range   Glucose-Capillary 157 (*) 70 - 99 mg/dL   Comment 1 Notify RN    TROPONIN I     Status: Normal   Collection Time   06/18/12  3:20 PM      Component Value Range   Troponin I <0.30  <0.30 ng/mL  GLUCOSE, CAPILLARY     Status: Abnormal   Collection Time   06/18/12  5:23 PM      Component Value Range   Glucose-Capillary 110 (*) 70 - 99 mg/dL  PRO B NATRIURETIC PEPTIDE     Status: Abnormal   Collection Time   06/18/12  9:07 PM      Component Value Range   Pro B Natriuretic peptide (BNP) 847.9 (*) 0 - 125 pg/mL  TROPONIN I     Status: Normal   Collection Time   06/18/12  9:07 PM      Component Value Range   Troponin I <0.30  <0.30 ng/mL  GLUCOSE, CAPILLARY     Status: Abnormal   Collection Time   06/18/12 10:06 PM      Component Value Range   Glucose-Capillary 137 (*) 70 - 99 mg/dL   Comment 1 Notify RN    TROPONIN I     Status: Normal   Collection Time   06/19/12  2:17 AM      Component Value Range   Troponin I <0.30  <0.30 ng/mL  BASIC METABOLIC PANEL     Status: Abnormal  Collection Time   06/19/12  2:40 AM      Component Value Range   Sodium 137  135 - 145 mEq/L   Potassium 4.0  3.5 - 5.1 mEq/L   Chloride 102  96 - 112 mEq/L   CO2 25  19 - 32 mEq/L   Glucose, Bld 169 (*) 70 - 99 mg/dL   BUN 15  6 - 23 mg/dL   Creatinine, Ser 1.61 (*) 0.50 - 1.10 mg/dL   Calcium 8.8  8.4 - 09.6 mg/dL   GFR calc non Af Amer 47 (*) >90 mL/min   GFR calc Af Amer 55 (*) >90 mL/min  LIPID PANEL     Status: Abnormal   Collection Time   06/19/12  2:40 AM      Component Value Range   Cholesterol 123  0 - 200 mg/dL   Triglycerides 045  <409 mg/dL   HDL 31 (*) >81 mg/dL   Total CHOL/HDL Ratio 4.0     VLDL 28  0 - 40 mg/dL   LDL Cholesterol 64  0 - 99 mg/dL     Studies/Results: @RISRSLT24 @     . albuterol  2 puff Inhalation TID  . aspirin  81 mg Oral Daily  . cilostazol  50 mg Oral BID AC  . colchicine  0.6 mg Oral Daily  . dextrose      . diltiazem  10 mg  Intravenous Once  . fluticasone  2 spray Each Nare Daily  . folic acid  1 mg Oral Daily  . furosemide  40 mg Intravenous BID  . heparin  5,000 Units Subcutaneous Q8H  . insulin aspart  0-9 Units Subcutaneous TID WC  . isosorbide mononitrate  30 mg Oral Daily  . lidocaine  1 patch Transdermal Q24H  . linagliptin  5 mg Oral Daily  . metoprolol  50 mg Oral BID  . montelukast  10 mg Oral QHS  . pantoprazole  40 mg Oral Daily  . pneumococcal 23 valent vaccine  0.5 mL Intramuscular Tomorrow-1000  . polyethylene glycol  17 g Oral Daily  . senna  1 tablet Oral Daily  . DISCONTD: cilostazol  100 mg Oral BID AC  . DISCONTD: glimepiride  2 mg Oral QAC breakfast  . DISCONTD: insulin glargine  4 Units Subcutaneous QHS     Assessment/Plan: s/p Procedure(s): LAPAROSCOPIC VENTRAL HERNIA INSERTION OF MESH LYSIS OF ADHESION   POD #6. Stable. She is basically doing very well from her abdominal surgery, but is significantly deconditioned.  Mobilize more  PT involved  Ileus resolving, tolerating diet carb modified Drains and staples out soon.  New episode chest pain, etiology unclear. Initial evaluation negative for acute myocardial injury but positive for fluid overload requiring diuresis.  Known chronic CHF.Currently asymptomatic. Telemetry. Protonix.  Atalectasis, history OSA, COPD.. .  Albuterol inhaler every 6 hours  Flutter valve  Incentive spirometry  Ambulate  off narcotics.  [facility] managing pulmonary, CHF, DM     Multiple comorbidities:  Obesity  Congestive heart failure followed by Nanetta Batty.  Obstructive sleep apnea  COPD  Compensated cirrhosis  Hypertension  Coronary artery disease-Bone daily metoprolol 50 mg.  Diabetes mellitus some episodes of hypoglycemia, so Lantus insulin reduced...held last night, SSI, amaryl  Peripheral vascular disease.  History laparoscopic right colectomy for villous adenoma  History abdominal hysterectomy.   Disp: abdominal  and GI issues are resolving. However, will need to remain hospitalized until cardiac, pulmonary and otther medical problems are stabilized.  LOS: 6 days    Jalani Cullifer M. Derrell Lolling, M.D., [facility] General and Minimally invasive Surgery Breast and Colorectal Surgery Office:   [phone] Pager:   [phone]  06/19/2012  . .prob

## 2012-06-19 NOTE — Progress Notes (Signed)
Patient ID: [name], female   DOB: [birth year], 66 y.o.   MRN: [id number]    SUBJECTIVE: She has been up and about this morning.  Seems to have diuresed well with IV Lasix per her report but I/Os not measured.  Creatinine improved.  No further chest pain, ruled out for MI.     Marland Kitchen albuterol  2 puff Inhalation TID  . aspirin  81 mg Oral Daily  . atorvastatin  20 mg Oral q1800  . colchicine  0.6 mg Oral Daily  . dextrose      . diltiazem  10 mg Intravenous Once  . fluticasone  2 spray Each Nare Daily  . folic acid  1 mg Oral Daily  . furosemide  40 mg Intravenous BID  . heparin  5,000 Units Subcutaneous Q8H  . insulin aspart  0-9 Units Subcutaneous TID WC  . isosorbide mononitrate  30 mg Oral Daily  . lidocaine  1 patch Transdermal Q24H  . linagliptin  5 mg Oral Daily  . metoprolol succinate  75 mg Oral Daily  . montelukast  10 mg Oral QHS  . pantoprazole  40 mg Oral Daily  . pneumococcal 23 valent vaccine  0.5 mL Intramuscular Tomorrow-1000  . polyethylene glycol  17 g Oral Daily  . senna  1 tablet Oral Daily  . DISCONTD: cilostazol  100 mg Oral BID AC  . DISCONTD: cilostazol  50 mg Oral BID AC  . DISCONTD: glimepiride  2 mg Oral QAC breakfast  . DISCONTD: insulin glargine  4 Units Subcutaneous QHS  . DISCONTD: metoprolol  50 mg Oral BID      Filed Vitals:   06/18/12 2321 06/19/12 0024 06/19/12 0152 06/19/12 0631  BP: 145/68 119/56 98/67 128/66  Pulse: 140 110 107 105  Temp:   99 F (37.2 C) 99.4 F (37.4 C)  TempSrc:   Oral Oral  Resp:   16 17  Height:      Weight:      SpO2:   95% 92%    Intake/Output Summary (Last 24 hours) at 06/19/12 0818 Last data filed at 06/19/12 1191  Gross per 24 hour  Intake    870 ml  Output    375 ml  Net    495 ml    LABS: Basic Metabolic Panel:  Basename 06/19/12 0240 06/18/12 0520  NA 137 134*  K 4.0 4.4  CL 102 104  CO2 25 24  GLUCOSE 169* 72  BUN 15 18  CREATININE 1.17* 1.32*  CALCIUM 8.8 8.7  MG -- --  PHOS  -- --   Liver Function Tests: No results found for this basename: AST:2,ALT:2,ALKPHOS:2,BILITOT:2,PROT:2,ALBUMIN:2 in the last 72 hours No results found for this basename: LIPASE:2,AMYLASE:2 in the last 72 hours CBC:  Basename 06/18/12 0520 06/17/12 0635  WBC 10.7* 12.9*  NEUTROABS -- --  HGB 8.6* 8.9*  HCT 28.0* 29.1*  MCV 80.9 81.5  PLT 271 255   Cardiac Enzymes:  Basename 06/19/12 0217 06/18/12 2107 06/18/12 1520  CKTOTAL -- -- --  CKMB -- -- --  CKMBINDEX -- -- --  TROPONINI <0.30 <0.30 <0.30   BNP: No components found with this basename: POCBNP:3 D-Dimer: No results found for this basename: DDIMER:2 in the last 72 hours Hemoglobin A1C: No results found for this basename: HGBA1C in the last 72 hours Fasting Lipid Panel:  Basename 06/19/12 0240  CHOL 123  HDL 31*  LDLCALC 64  TRIG 478  CHOLHDL 4.0  LDLDIRECT --  Thyroid Function Tests: No results found for this basename: TSH,T4TOTAL,FREET3,T3FREE,THYROIDAB in the last 72 hours Anemia Panel: No results found for this basename: VITAMINB12,FOLATE,FERRITIN,TIBC,IRON,RETICCTPCT in the last 72 hours  RADIOLOGY: Dg Chest 2 View  06/19/2012  *RADIOLOGY REPORT*  Clinical Data: Cough.  CHEST - 2 VIEW  Comparison: June 17, 2012.  Findings: No change is noted in moderate bilateral pleural effusions.  Underlying pneumonia or atelectasis cannot be excluded. Bony thorax appears intact.  IMPRESSION: No change in moderate bilateral pleural effusions.   Original Report Authenticated By: Venita Sheffield., M.D.    PHYSICAL EXAM General: NAD Neck: JVP 8-9 cm, no thyromegaly or thyroid nodule.  Lungs: Decreased breath sounds at bases bilaterally.  CV: Nondisplaced PMI.  Heart regular S1/S2, no S3/S4, no murmur.  No peripheral edema.  No carotid bruit.  Unable to palpate pedal pulses.   Abdomen: Soft, nontender, no hepatosplenomegaly, no distention.  Neurologic: Alert and oriented x 3.  Psych: Normal affect. Extremities: No  clubbing or cyanosis.   TELEMETRY: Reviewed telemetry pt in sinus rhythm to sinus tachycardia.   ASSESSMENT AND PLAN: 66 yo with history of PAD, COPD, type II diabetes, chronic diastolic CHF, and repair of ventral hernia this admission. From a cardiac perspective, she has had brief chest pain episodes. She has also had acute/chronic diastolic CHF.  1. Chest pain: Cardiac enzymes negative, ECG unremarkable. She does have significant risk for CAD given PAD history.  - Continue ASA 81, Imdur, and statin.  - I will change metoprolol to Toprol XL 75 mg daily for better beta blocker coverage in the setting of frequent sinus tachycardia.  - If she does not have further significant symptoms, would probably do functional study as outpatient.  2. Acute on chronic diastolic CHF: She is volume overloaded on exam.  Though I/Os were not recorded yesterday, per her report she diuresed well. BNP elevated.  - No more IV fluid - Continue IV Lasix for now.  Creatinine improved this morning.  3. CKD: Creatinine improved this morning.  Follow closely with diuresis.   When I initially saw her, patient was unable to remember if she sees a cardiologist. However, by the end of my discussion with her she remembered the location of the office and it sounds like she has seen Dr. Allyson Sabal. I spoke with Dr. Royann Shivers yesterday. We can follow her while she is in the hospital and she can see Dr. Allyson Sabal after discharge.   Marca Ancona 06/19/2012 8:24 AM

## 2012-06-20 DIAGNOSIS — I5033 Acute on chronic diastolic (congestive) heart failure: Secondary | ICD-10-CM

## 2012-06-20 LAB — BASIC METABOLIC PANEL
BUN: 14 mg/dL (ref 6–23)
CO2: 30 mEq/L (ref 19–32)
Calcium: 9.1 mg/dL (ref 8.4–10.5)
Chloride: 98 mEq/L (ref 96–112)
Creatinine, Ser: 1.36 mg/dL — ABNORMAL HIGH (ref 0.50–1.10)
Glucose, Bld: 151 mg/dL — ABNORMAL HIGH (ref 70–99)

## 2012-06-20 LAB — GLUCOSE, CAPILLARY: Glucose-Capillary: 122 mg/dL — ABNORMAL HIGH (ref 70–99)

## 2012-06-20 MED ORDER — MAGNESIUM SULFATE IN D5W 10-5 MG/ML-% IV SOLN
1.0000 g | Freq: Once | INTRAVENOUS | Status: DC
  Filled 2012-06-20: qty 100

## 2012-06-20 MED ORDER — MAGNESIUM OXIDE 400 (241.3 MG) MG PO TABS
800.0000 mg | ORAL_TABLET | Freq: Two times a day (BID) | ORAL | Status: DC
  Administered 2012-06-20: 800 mg via ORAL
  Filled 2012-06-20 (×2): qty 2

## 2012-06-20 NOTE — Progress Notes (Addendum)
Patient Name: [name] Date of Encounter: 06/20/2012   Principal Problem:  *Incisional hernia Active Problems:  COPD (chronic obstructive pulmonary disease)  Sleep apnea  Controlled diabetes mellitus type 2 with complications  CKD (chronic kidney disease), stage III  Diastolic CHF, chronic  Chest pain  Acute on chronic diastolic CHF (congestive heart failure)    SUBJECTIVE  No chest pain or sob.  CURRENT MEDS    . albuterol  2 puff Inhalation TID  . aspirin  81 mg Oral Daily  . atorvastatin  20 mg Oral q1800  . colchicine  0.6 mg Oral Daily  . fluticasone  2 spray Each Nare Daily  . folic acid  1 mg Oral Daily  . furosemide  40 mg Intravenous BID  . heparin  5,000 Units Subcutaneous Q8H  . insulin aspart  0-9 Units Subcutaneous TID WC  . isosorbide mononitrate  30 mg Oral Daily  . lidocaine  1 patch Transdermal Q24H  . linagliptin  5 mg Oral Daily  . metoprolol succinate  75 mg Oral Daily  . montelukast  10 mg Oral QHS  . pantoprazole  40 mg Oral Daily  . polyethylene glycol  17 g Oral Daily  . senna  1 tablet Oral Daily  . DISCONTD: cilostazol  50 mg Oral BID AC  . DISCONTD: metoprolol  50 mg Oral BID   OBJECTIVE  Filed Vitals:   06/19/12 0835 06/19/12 1252 06/19/12 2142 06/20/12 0606  BP: 128/60 151/85 143/77 127/66  Pulse: 103 101 98 87  Temp: 98.6 F (37 C) 98.6 F (37 C) 99 F (37.2 C) 98.2 F (36.8 C)  TempSrc: Oral Oral Oral Oral  Resp: 16 16 16 16   Height:      Weight:      SpO2: 93% 94% 98% 94%    Intake/Output Summary (Last 24 hours) at 06/20/12 0732 Last data filed at 06/20/12 0602  Gross per 24 hour  Intake   2018 ml  Output    685 ml  Net   1333 ml   Filed Weights   06/13/12 0716 06/13/12 1448  Weight: 155 lb 12 oz (70.648 kg) 162 lb 7.7 oz (73.7 kg)   PHYSICAL EXAM  General: Pleasant, NAD. Neuro: Alert and oriented X 3. Moves all extremities spontaneously. Psych: Normal affect. HEENT:  Normal  Neck: Supple without  bruits.  JVP ~ 8. Lungs:  Resp regular and unlabored, bibasilar crackles. Heart: RRR, tachy, no s3, s4, or murmurs. Abdomen: round, protuberant, non-tender, BS + x 4.  Extremities: No clubbing, cyanosis or edema. DP/PT/Radials 2+ and equal bilaterally.  Accessory Clinical Findings  CBC  Basename 06/18/12 0520  WBC 10.7*  NEUTROABS --  HGB 8.6*  HCT 28.0*  MCV 80.9  PLT 271   Basic Metabolic Panel  Basename 06/19/12 0240 06/18/12 0520  NA 137 134*  K 4.0 4.4  CL 102 104  CO2 25 24  GLUCOSE 169* 72  BUN 15 18  CREATININE 1.17* 1.32*  CALCIUM 8.8 8.7  MG -- --  PHOS -- --   Cardiac Enzymes  Basename 06/19/12 0825 06/19/12 0217 06/18/12 2107  CKTOTAL -- -- --  CKMB -- -- --  CKMBINDEX -- -- --  TROPONINI <0.30 <0.30 <0.30   Fasting Lipid Panel  Basename 06/19/12 0240  CHOL 123  HDL 31*  LDLCALC 64  TRIG 161  CHOLHDL 4.0  LDLDIRECT --    TELE  Sinus tach, low 100's  Radiology/Studies  Nm Pulmonary Perf  And Vent  06/19/2012  *RADIOLOGY REPORT*  Clinical Data:  Tachycardia, short of breath  NUCLEAR MEDICINE VENTILATION - PERFUSION LUNG SCAN  Technique:  Ventilation images were obtained in multiple projections using inhaled aerosol technetium 99 M DTPA.  Perfusion images were obtained in multiple projections after intravenous injection of Tc-27m MAA.  Radiopharmaceuticals:  Tc-70m DTPA aerosol and six mCi Tc-21m MAA.  Comparison: Chest radiograph 06/19/2012  Findings:  Ventilation:  There there is poor ventilation effort resulting in poor definition of the peripheral lungs.  Perfusion:   No wedge shaped peripheral perfusion defects to suggest acute pulmonary embolism  IMPRESSION: Very low probability acute pulmonary embolism.   Original Report Authenticated By: Genevive Bi, M.D.     ASSESSMENT AND PLAN  1.  Ventral Hernia: s/p repair.  Per [facility].  2.  Acute on chronic diastolic chf:  NL EF by echo this admission.  Not weighed since 10/16.  Daily  weights ordered.  Pt reports significant UO, though only 685 recorded.  She cont to have fine basilar crackles though volume otw looks stable.  Await weight this AM.  Suspect we can likely transition to PO lasix today.  Creat pending.  She remains mildly tachycardic.  Titrate bb to 100mg  daily.  3. Chest Pain:  No further chest pain.  Plan outpt f/u with [facility] @ which point they may consider a lexiscan myoview.  4.  CKD III:  bmet pending.  5.  DM:  Per IM.  6.  Anemia:  H/H trending down as of 10/21 - f/u per IM.  Signed, Nicolasa Ducking NP  AddendumNoland Fordyce 158.8 today - depending on which weight on 10/16 was accurate, she is either down 4lbs or up 3 lbs.    ATTENDING:  Patient seen and examined independently. Gilford Raid, NP note reviewed carefully - agree with his assessment and plan. I have edited the note based on my findings.   She is doing well post-op. Was able to walk with PT without CP or dyspnea. Volume status looks very good on exam. Will stop IV lasix. Can use po lasix as needed. Remains with sinus tach which may be due to in part to her anemia. Management of anemia per primary team.  Agree that previous CP seems non-cardiac. Will need outpatient f/u with [facility] for probable outpatient stress test.  We will sign off. Please do not hesitate to contact us with questions.  Jiana Lemaire,MD 12:21 PM

## 2012-06-20 NOTE — Progress Notes (Signed)
7 Days Post-Op  Subjective: Continues to slowly improve. Collar I. Tolerating diet okay. Having bowel movements. Abdominal pain under good control.  She denies chest pain or shortness of breath.beta blocker coverage modified yesterday. Continues on IV Lasix and IV fluids discontinued. Creatinine improved yesterday.  Lower extremity venous duplex performed yesterday, no evidence of DVT, or superficial thrombosis.  Diabetes coverage modified due to blood sugars running low. Currently Lantus and Amaryl have been stopped. Sliding scale insulin reduced to lower dose.  I greatly appreciate the management of her medical problems by [facility] and cardiology.  Objective: Vital signs in last 24 hours: Temp:  [98.2 F (36.8 C)-99.4 F (37.4 C)] 98.2 F (36.8 C) (10/23 0606) Pulse Rate:  [87-105] 87  (10/23 0606) Resp:  [16-17] 16  (10/23 0606) BP: (127-151)/(60-85) 127/66 mmHg (10/23 0606) SpO2:  [92 %-98 %] 94 % (10/23 0606) Last BM Date: 06/19/12  Intake/Output from previous day: 10/22 0701 - 10/23 0700 In: 2018 [P.O.:360; I.V.:1650; IV Piggyback:8] Out: 685 [Urine:650; Drains:35] Intake/Output this shift: Total I/O In: -  Out: 320 [Urine:300; Drains:20]  General appearance: alert. Oriented. A little more animated than before. No distress. Mental status back to baseline. Still somewhat deconditioned. GI: abdomen soft. Protuberant. Wound clean. Drainage serosanguineous, low volume. Drains left in. Unremarkable exam considering recent surgery.  Lab Results:  Results for orders placed during the hospital encounter of 06/13/12 (from the past 24 hour(s))  GLUCOSE, CAPILLARY     Status: Abnormal   Collection Time   06/19/12  8:24 AM      Component Value Range   Glucose-Capillary 112 (*) 70 - 99 mg/dL   Comment 1 Notify RN    TROPONIN I     Status: Normal   Collection Time   06/19/12  8:25 AM      Component Value Range   Troponin I <0.30  <0.30 ng/mL  GLUCOSE, CAPILLARY      Status: Abnormal   Collection Time   06/19/12 12:32 PM      Component Value Range   Glucose-Capillary 188 (*) 70 - 99 mg/dL   Comment 1 Notify RN    GLUCOSE, CAPILLARY     Status: Abnormal   Collection Time   06/19/12  5:38 PM      Component Value Range   Glucose-Capillary 119 (*) 70 - 99 mg/dL   Comment 1 Notify RN    GLUCOSE, CAPILLARY     Status: Abnormal   Collection Time   06/19/12  9:51 PM      Component Value Range   Glucose-Capillary 120 (*) 70 - 99 mg/dL     Studies/Results: @RISRSLT24 @     . albuterol  2 puff Inhalation TID  . aspirin  81 mg Oral Daily  . atorvastatin  20 mg Oral q1800  . colchicine  0.6 mg Oral Daily  . fluticasone  2 spray Each Nare Daily  . folic acid  1 mg Oral Daily  . furosemide  40 mg Intravenous BID  . heparin  5,000 Units Subcutaneous Q8H  . insulin aspart  0-9 Units Subcutaneous TID WC  . isosorbide mononitrate  30 mg Oral Daily  . lidocaine  1 patch Transdermal Q24H  . linagliptin  5 mg Oral Daily  . metoprolol succinate  75 mg Oral Daily  . montelukast  10 mg Oral QHS  . pantoprazole  40 mg Oral Daily  . polyethylene glycol  17 g Oral Daily  . senna  1 tablet Oral  Daily  . DISCONTD: cilostazol  50 mg Oral BID AC  . DISCONTD: metoprolol  50 mg Oral BID     Assessment/Plan: s/p Procedure(s): LAPAROSCOPIC VENTRAL HERNIA INSERTION OF MESH LYSIS OF ADHESION  POD #7. Stable. She is basically doing very well from her abdominal surgery, but is significantly deconditioned.  Mobilize more  PT involved  Ileus resolved, tolerating diet carb modified  Drains and staples out soon.   New episode chest pain, etiology unclear. Initial evaluation negative for acute myocardial injury but positive for fluid overload requiring diuresis. Known chronic CHF.Currently asymptomatic. Telemetry. Protonix.  Lower extremity duplex study negative for DVT  Atalectasis, history OSA, COPD.Marland Kitchen  Doing better.  Albuterol inhaler every 6 hours  Flutter  valve  Incentive spirometry  Ambulate  off narcotics.  [facility] managing pulmonary, CHF, DM   Multiple comorbidities:  Obesity  Congestive heart failure followed by Nanetta Batty.  Obstructive sleep apnea  COPD  Compensated cirrhosis  Hypertension  Coronary artery disease  metoprolol increased to 75 mg. By cardiology Diabetes mellitus some episodes of hypoglycemia, so Lantus insulin ..held , SSI reduced  amaryl held Peripheral vascular disease.  History laparoscopic right colectomy for villous adenoma  History abdominal hysterectomy.   Disp: abdominal and GI issues are resolving.From a  surgical and GI standpoint, she meets discharge criteria. However, will need to remain hospitalized until cardiac, pulmonary and other medical problems are stabilized. Await advice from [facility]  hospitalist and cardiology regarding outpatient management.       LOS: 7 days    Juanpablo Ciresi M. Derrell Lolling, M.D., [facility] General and Minimally invasive Surgery Breast and Colorectal Surgery Office:   [phone] Pager:   [phone]  06/20/2012  . .prob

## 2012-06-20 NOTE — Progress Notes (Signed)
Physical Therapy Treatment Patient Details Name: [name] MRN: [id number] DOB: 12/01/1945 Today's Date: 06/20/2012 Time: 1130-1209 PT Time Calculation (min): 39 min  PT Assessment / Plan / Recommendation Comments on Treatment Session  Pt admitted s/p laparoscopic hernia and continues to be motivated to progress with therapy. Pt able to significantly increase ambulation distance today as well as activity tolerance.     Follow Up Recommendations  Post acute inpatient     Does the patient have the potential to tolerate intense rehabilitation  No, Recommend SNF  Barriers to Discharge        Equipment Recommendations  Rolling walker with 5" wheels    Recommendations for Other Services    Frequency Min 3X/week   Plan Discharge plan remains appropriate;Frequency remains appropriate    Precautions / Restrictions Precautions Precautions: Fall Restrictions Weight Bearing Restrictions: No   Pertinent Vitals/Pain None    Mobility  Bed Mobility Bed Mobility: Supine to Sit Supine to Sit: 4: Min assist Details for Bed Mobility Assistance: Assist for trunk due to discomfort with mobility from drains with cues for safe sequence. Transfers Transfers: Sit to Stand;Stand to Sit Sit to Stand: 5: Supervision;With upper extremity assist;From bed;From toilet Stand to Sit: 5: Supervision;With upper extremity assist;To toilet;To chair/3-in-1 Details for Transfer Assistance: Verbal cues for safest hand placement. Ambulation/Gait Ambulation/Gait Assistance: 4: Min guard Ambulation Distance (Feet): 800 Feet Assistive device: Rolling walker Ambulation/Gait Assistance Details: Guarding for balance with cautious sequence/guarded posture. Gait Pattern: Step-through pattern;Decreased stride length Stairs: No Wheelchair Mobility Wheelchair Mobility: No    Exercises     PT Diagnosis:    PT Problem List:   PT Treatment Interventions:     PT Goals Acute Rehab PT Goals PT Goal  Formulation: With patient Time For Goal Achievement: 06/29/12 Potential to Achieve Goals: Good PT Goal: Supine/Side to Sit - Progress: Progressing toward goal PT Goal: Sit to Stand - Progress: Progressing toward goal PT Goal: Ambulate - Progress: Progressing toward goal  Visit Information  Last PT Received On: 06/20/12 Assistance Needed: +1    Subjective Data  Subjective: "I just need to get out there and walk!" Patient Stated Goal: Home I, I need to do for myself   Cognition  Overall Cognitive Status: Appears within functional limits for tasks assessed/performed Arousal/Alertness: Awake/alert Orientation Level: Appears intact for tasks assessed Behavior During Session: [facility] for tasks performed    Balance  Balance Balance Assessed: No  End of Session PT - End of Session Equipment Utilized During Treatment: Gait belt Activity Tolerance: Patient tolerated treatment well Patient left: in chair;with call bell/phone within reach;Other (comment) (With student RN present.) Nurse Communication: Mobility status   GP     Cephus Shelling 06/20/2012, 12:18 PM  06/20/2012 Cephus Shelling, PT, DPT [phone]

## 2012-06-20 NOTE — Progress Notes (Signed)
[facility]                                                                                Patient Demographics  [name], is a 66 y.o. female  ZOX:[id number]  [id number]:[id number]  DOB - 1945-09-14  Admit date - 06/13/2012  Admitting Physician Ernestene Mention, MD  Outpatient Primary MD for the patient is Dorrene German, MD  LOS - 7   No chief complaint on file.       Assessment & Plan    Lethargy and SOB   Likely combination of narcotic, CO2 retention, now completely resolved, minimize use of narcotics, appears back to baseline. Outpatient followup for sleep apnea with pulmonology. She has history of COPD for which she uses nebulizers at home, will recommend outpatient pulmonary followup after discharge.  Patient does have history of COPD and smoking, we'll try to titrate off oxygen, if she still remains on oxygen home oxygen will be ordered through case manager, chest x-ray does suggest atelectasis in the setting of recent surgery have encouraged patient to use incentive parameter every hour. However in the light of episodes of tachycardia most likely secondary to deconditioning, she has negative lower extremity venous duplex, echo shows no RV strain, VQ scan is low probability for PE, at this time we can safely assume no PE.   Patient is deconditioned and will benefit from continued PT OT.     Diabetes mellitus type 2  -Uncontrolled DM 2 with hemoglobin A1c of 8.0 which correlate to a mean plasma glucose of 183.  Currently sugars running on the low side due to combination of decreased by mouth intake, strict Carb modified diet, acute renal failure causing Amaryl and insulin to hang for longer time. Have Stopped Lantus and Amaryl.  Continue for now sliding scale insulin at low dose along with Tragenta and monitor sugars now stable  When she goes home discontinue her Amaryl , she can resume her home dose Lantus along with Januvia, Accu-Chek instructions  written in discharge instructions section.   Lab Results  Component Value Date   HGBA1C 8.0* 06/13/2012    CBG (last 3)   Basename 06/20/12 0747 06/19/12 2151 06/19/12 1738  GLUCAP 122* 120* 119*      Acute renal failure on CKD stage III  Creatinine peaked at 1.6 and improved with IV fluids, at this point IV fluids have been stopped by cardiology and patient has been placed on Lasix, monitor BMP. Creatinine appears to be at baseline for now. Clinically appears euvolemic at this time. Lasix per cardiology.     Acute on Chronic diastolic CHF currently compensated  Per cardiology patient getting Lasix   Chest pain around 2.45pm 06-18-12 lasting few seconds with exertion (severely deconditioned) , episode of SVT  Likely non cardiac, will check EKG, stable Echo, -ve Trop, continue ASA, B blocker, now pain free, Cards input appreciated discussed with Dr. Shirlee Latch on 06/19/2012       Time Spent in minutes   35   Antibiotics    Anti-infectives     Start     Dose/Rate Route Frequency Ordered Stop   06/13/12 1645   ceFAZolin (ANCEF) IVPB  2 g/50 mL premix        2 g 100 mL/hr over 30 Minutes Intravenous Every 8 hours 06/13/12 1447 06/14/12 0816   06/13/12 0930   ceFAZolin (ANCEF) IVPB 2 g/50 mL premix        2 g 100 mL/hr over 30 Minutes Intravenous  Once 06/13/12 0918 06/13/12 0849          Scheduled Meds:    . albuterol  2 puff Inhalation TID  . aspirin  81 mg Oral Daily  . atorvastatin  20 mg Oral q1800  . colchicine  0.6 mg Oral Daily  . fluticasone  2 spray Each Nare Daily  . folic acid  1 mg Oral Daily  . furosemide  40 mg Intravenous BID  . heparin  5,000 Units Subcutaneous Q8H  . insulin aspart  0-9 Units Subcutaneous TID WC  . isosorbide mononitrate  30 mg Oral Daily  . lidocaine  1 patch Transdermal Q24H  . linagliptin  5 mg Oral Daily  . magnesium sulfate 1 - 4 g bolus IVPB  1 g Intravenous Once  . metoprolol succinate  75 mg Oral Daily  .  montelukast  10 mg Oral QHS  . pantoprazole  40 mg Oral Daily  . polyethylene glycol  17 g Oral Daily  . senna  1 tablet Oral Daily   Continuous Infusions:   PRN Meds:.albuterol, ketorolac, ondansetron (ZOFRAN) IV, ondansetron, oxyCODONE-acetaminophen, technetium TC 8M diethylenetriame-pentaacetic acid, temazepam, triamcinolone cream   DVT Prophylaxis    Heparin   Lab Results  Component Value Date   PLT 271 06/18/2012      Susa Raring K M.D on 06/20/2012 at 10:51 AM  Between 7am to 7pm - Pager - [phone]  After 7pm go to [email or web address] - password [email or web address]  And look for the night coverage person covering for me after hours  [facility] Office  [phone]    Subjective:   [name] today has, No headache, No chest pain, No abdominal pain - No Nausea, No new weakness tingling or numbness, No Cough - SOB.    Objective:   Filed Vitals:   06/19/12 1252 06/19/12 2142 06/20/12 0606 06/20/12 0735  BP: 151/85 143/77 127/66 144/72  Pulse: 101 98 87 90  Temp: 98.6 F (37 C) 99 F (37.2 C) 98.2 F (36.8 C) 98.3 F (36.8 C)  TempSrc: Oral Oral Oral Oral  Resp: 16 16 16 16   Height:      Weight:    72.031 kg (158 lb 12.8 oz)  SpO2: 94% 98% 94% 90%    Wt Readings from Last 3 Encounters:  06/20/12 72.031 kg (158 lb 12.8 oz)  06/20/12 72.031 kg (158 lb 12.8 oz)  03/06/12 70.67 kg (155 lb 12.8 oz)     Intake/Output Summary (Last 24 hours) at 06/20/12 1051 Last data filed at 06/20/12 0810  Gross per 24 hour  Intake   1898 ml  Output    685 ml  Net   1213 ml    Exam Awake Alert, Oriented X 3, No new F.N deficits, Normal affect [place].AT,PERRAL Supple Neck,No JVD, No cervical lymphadenopathy appriciated.  Symmetrical Chest wall movement, Good air movement bilaterally, CTAB RRR,No Gallops,Rubs or new Murmurs, No Parasternal Heave +ve B.Sounds, Abd Soft, Non tender, No organomegaly appriciated, No rebound - guarding or rigidity. No Cyanosis, Clubbing  or edema, No new Rash or bruise     Data Review   Micro Results Recent Results (from the past 240 hour(s))  CULTURE, BLOOD (ROUTINE X 2)     Status: Normal (Preliminary result)   Collection Time   06/16/12 10:31 AM      Component Value Range Status Comment   Specimen Description BLOOD HAND LEFT   Final    Special Requests BOTTLES DRAWN AEROBIC ONLY 7CC   Final    Culture  Setup Time 06/16/2012 18:52   Final    Culture     Final    Value:        BLOOD CULTURE RECEIVED NO GROWTH TO DATE CULTURE WILL BE HELD FOR 5 DAYS BEFORE ISSUING A FINAL NEGATIVE REPORT   Report Status PENDING   Incomplete   CULTURE, BLOOD (ROUTINE X 2)     Status: Normal (Preliminary result)   Collection Time   06/16/12 10:38 AM      Component Value Range Status Comment   Specimen Description BLOOD ARM RIGHT   Final    Special Requests     Final    Value: BOTTLES DRAWN AEROBIC AND ANAEROBIC RED 5CC, BLUE 10CC   Culture  Setup Time 06/16/2012 18:52   Final    Culture     Final    Value:        BLOOD CULTURE RECEIVED NO GROWTH TO DATE CULTURE WILL BE HELD FOR 5 DAYS BEFORE ISSUING A FINAL NEGATIVE REPORT   Report Status PENDING   Incomplete   URINE CULTURE     Status: Normal   Collection Time   06/16/12  4:52 PM      Component Value Range Status Comment   Specimen Description URINE, CLEAN CATCH   Final    Special Requests NONE   Final    Culture  Setup Time 06/16/2012 22:00   Final    Colony Count 2,000 COLONIES/ML   Final    Culture INSIGNIFICANT GROWTH   Final    Report Status 06/17/2012 FINAL   Final     Radiology Reports Dg Chest 2 View  06/17/2012  *RADIOLOGY REPORT*  Clinical Data: Shortness of breath and weakness.  CHEST - 2 VIEW  Comparison: PA and lateral chest 06/15/2012.  Findings: Lung volumes are low.  Small bilateral pleural effusions and associated basilar atelectasis persist but appear slightly decreased.  No pneumothorax is identified.  Heart size is upper normal.  IMPRESSION: Slight  decrease in small bilateral pleural effusions.  No other change.   Original Report Authenticated By: Bernadene Bell. Maricela Curet, M.D.    Dg Chest 2 View  06/15/2012  *RADIOLOGY REPORT*  Clinical Data: Asthma.  Hypoxia.  Chest pain and shortness of breath.  CHEST - 2 VIEW  Comparison: Chest x-ray 06/05/2012.  Findings: Lung volumes have decreased.  Worsening bibasilar opacities compatible with increasing atelectasis and/or consolidation in the lower lobes of the lungs bilaterally. Increasing small bilateral pleural effusions.  There is some crowding of the pulmonary vasculature, accentuated by low lung volumes, without frank pulmonary edema.  Heart size appears borderline enlarged. The patient is rotated to the right on today's exam, resulting in distortion of the mediastinal contours and reduced diagnostic sensitivity and specificity for mediastinal pathology.  Atherosclerosis in the thoracic aorta.  IMPRESSION: 1.  Decreasing lung volumes with worsened bibasilar aeration which may represent areas of atelectasis and/or consolidation, with superimposed small bilateral pleural effusions. 2.  Atherosclerosis.   Original Report Authenticated By: Florencia Reasons, M.D.    Dg Chest 2 View  06/05/2012  *RADIOLOGY REPORT*  Clinical Data: Cough and congestion; emphysema  CHEST -  2 VIEW  Comparison: May 11, 2011  Findings: There is underlying emphysema.  There is scarring in the bases.  There is no edema or consolidation.  The heart size is normal.  The pulmonary vascularity reflects underlying emphysema.  There is thoracic levoscoliosis.  There is anterior wedging of several mid thoracic vertebral bodies, stable  .  IMPRESSION: Underlying emphysema.  Bibasilar scarring.  No edema or consolidation.   Original Report Authenticated By: Arvin Collard. WOODRUFF III, M.D.     CBC  Lab 06/18/12 0520 06/17/12 0635 06/15/12 0445 06/14/12 0436 06/13/12 1650  WBC 10.7* 12.9* 16.3* 12.2* 15.2*  HGB 8.6* 8.9* 10.2* 10.9*  11.5*  HCT 28.0* 29.1* 33.5* 35.5* 36.9  PLT 271 255 236 260 232  MCV 80.9 81.5 81.7 81.8 81.3  MCH 24.9* 24.9* 24.9* 25.1* 25.3*  MCHC 30.7 30.6 30.4 30.7 31.2  RDW 15.4 15.2 15.6* 15.2 15.0  LYMPHSABS -- -- -- -- --  MONOABS -- -- -- -- --  EOSABS -- -- -- -- --  BASOSABS -- -- -- -- --  BANDABS -- -- -- -- --    Chemistries   Lab 06/20/12 0920 06/19/12 0240 06/18/12 0520 06/17/12 0635 06/15/12 0445  NA 138 137 134* 134* 136  K 3.6 4.0 4.4 4.4 5.1  CL 98 102 104 101 103  CO2 30 25 24 24 25   GLUCOSE 151* 169* 72 56* 113*  BUN 14 15 18  24* 22  CREATININE 1.36* 1.17* 1.32* 1.56* 1.23*  CALCIUM 9.1 8.8 8.7 8.6 8.5  MG 1.3* -- -- -- --  AST -- -- -- -- 18  ALT -- -- -- -- <5  ALKPHOS -- -- -- -- 67  BILITOT -- -- -- -- 0.4   ------------------------------------------------------------------------------------------------------------------ estimated creatinine clearance is 35.5 ml/min (by C-G formula based on Cr of 1.36). ------------------------------------------------------------------------------------------------------------------ No results found for this basename: HGBA1C:2 in the last 72 hours ------------------------------------------------------------------------------------------------------------------  [facility] 06/19/12 0240  CHOL 123  HDL 31*  LDLCALC 64  TRIG 161  CHOLHDL 4.0  LDLDIRECT --   ------------------------------------------------------------------------------------------------------------------ No results found for this basename: TSH,T4TOTAL,FREET3,T3FREE,THYROIDAB in the last 72 hours ------------------------------------------------------------------------------------------------------------------ No results found for this basename: VITAMINB12:2,FOLATE:2,FERRITIN:2,TIBC:2,IRON:2,RETICCTPCT:2 in the last 72 hours  Coagulation profile No results found for this basename: INR:5,PROTIME:5 in the last 168 hours  No results found for this basename:  DDIMER:2 in the last 72 hours  Cardiac Enzymes  Lab 06/19/12 0825 06/19/12 0217 06/18/12 2107  CKMB -- -- --  TROPONINI <0.30 <0.30 <0.30  MYOGLOBIN -- -- --   ------------------------------------------------------------------------------------------------------------------ No components found with this basename: POCBNP:3

## 2012-06-20 NOTE — Progress Notes (Signed)
Pt's sat was 87-88% on room air while ambulating in hall. She rebounded to 97% when back in bed. She states she does not want or need oxygen at home at home.[name] 06/20/2012

## 2012-06-21 ENCOUNTER — Telehealth (INDEPENDENT_AMBULATORY_CARE_PROVIDER_SITE_OTHER): Payer: Self-pay | Admitting: Surgery

## 2012-06-21 DIAGNOSIS — K432 Incisional hernia without obstruction or gangrene: Secondary | ICD-10-CM

## 2012-06-21 LAB — GLUCOSE, CAPILLARY
Glucose-Capillary: 127 mg/dL — ABNORMAL HIGH (ref 70–99)
Glucose-Capillary: 148 mg/dL — ABNORMAL HIGH (ref 70–99)

## 2012-06-21 LAB — BASIC METABOLIC PANEL
CO2: 30 mEq/L (ref 19–32)
Calcium: 8.7 mg/dL (ref 8.4–10.5)
Creatinine, Ser: 1.34 mg/dL — ABNORMAL HIGH (ref 0.50–1.10)
Glucose, Bld: 125 mg/dL — ABNORMAL HIGH (ref 70–99)

## 2012-06-21 LAB — MAGNESIUM: Magnesium: 1.3 mg/dL — ABNORMAL LOW (ref 1.5–2.5)

## 2012-06-21 MED ORDER — MAGNESIUM OXIDE 400 (241.3 MG) MG PO TABS
800.0000 mg | ORAL_TABLET | Freq: Once | ORAL | Status: AC
  Administered 2012-06-21: 800 mg via ORAL
  Filled 2012-06-21: qty 2

## 2012-06-21 MED ORDER — METOPROLOL SUCCINATE ER 100 MG PO TB24: 100.0000 mg | ORAL_TABLET | Freq: Every day | ORAL | Status: DC

## 2012-06-21 MED ORDER — METOPROLOL SUCCINATE ER 25 MG PO TB24: 75.0000 mg | ORAL_TABLET | Freq: Every day | ORAL | Status: DC

## 2012-06-21 MED ORDER — FUROSEMIDE 40 MG PO TABS: 40.0000 mg | ORAL_TABLET | Freq: Every day | ORAL | Status: DC

## 2012-06-21 MED ORDER — MAGNESIUM OXIDE 400 (241.3 MG) MG PO TABS
800.0000 mg | ORAL_TABLET | Freq: Two times a day (BID) | ORAL | Status: DC
  Administered 2012-06-21: 800 mg via ORAL
  Filled 2012-06-21 (×2): qty 2

## 2012-06-21 MED ORDER — POTASSIUM CHLORIDE ER 10 MEQ PO TBCR: 10.0000 meq | EXTENDED_RELEASE_TABLET | Freq: Two times a day (BID) | ORAL | Status: DC

## 2012-06-21 MED ORDER — KETOROLAC TROMETHAMINE 10 MG PO TABS: 10.0000 mg | ORAL_TABLET | Freq: Four times a day (QID) | ORAL | Status: DC | PRN

## 2012-06-21 NOTE — Progress Notes (Signed)
SATURATION QUALIFICATIONS:  Patient Saturations on Room Air at Rest = 88%  Patient Saturations on Room Air while Ambulating = 85%  Patient Saturations on 3 Liters of oxygen while Ambulating = 94%  Statement of medical necessity for home oxygen:

## 2012-06-21 NOTE — Telephone Encounter (Signed)
Patient's family called to state she could not afford Toradol prescription.  I called pharmacy - [facility] on [address] - and called in Vicodin #[phone] with no refills.  Velora Heckler, MD, [facility] Office: [phone]

## 2012-06-21 NOTE — Progress Notes (Signed)
[facility]                                                                                Patient Demographics  [name], is [name] 66 y.o. female  [id number]  [id number]  DOB - October 16, 1945  Admit date - 06/13/2012  Admitting Physician Regina Mention, MD  Outpatient Primary MD for the patient is Regina German, MD  LOS - 8   No chief complaint on file.       Assessment & Plan    Lethargy and SOB   Likely combination of narcotic, CO2 retention, now completely resolved, minimize use of narcotics, appears back to baseline. Outpatient followup for sleep apnea with pulmonology. She has history of COPD for which she uses nebulizers at home, will recommend outpatient pulmonary followup after discharge.  Patient does have history of COPD and smoking, we'll try to titrate off oxygen, if she still remains on oxygen home oxygen will be ordered through case manager, chest x-ray does suggest atelectasis in the setting of recent surgery have encouraged patient to use incentive parameter every hour. However in the light of episodes of tachycardia most likely secondary to deconditioning, she has negative lower extremity venous duplex, echo shows no RV strain, VQ scan is low probability for PE, at this time we can safely assume no PE. Stable on RA now.  Patient is deconditioned and will benefit from continued PT OT.     Diabetes mellitus type 2  -Uncontrolled DM 2 with hemoglobin A1c of 8.0 which correlate to [name] mean plasma glucose of 183.  Currently sugars running on the low side due to combination of decreased by mouth intake, strict Carb modified diet, acute renal failure causing Amaryl and insulin to hang for longer time. Have Stopped Lantus and Amaryl.  Continue for now sliding scale insulin at low dose along with Tragenta and monitor sugars now stable  When she goes home discontinue her Amaryl , she can resume her home dose Lantus along with Januvia,  Accu-Chek instructions written in discharge instructions section.   Lab Results  Component Value Date   HGBA1C 8.0* 06/13/2012    CBG (last 3)   Basename 06/21/12 0815 06/20/12 2124 06/20/12 1710  GLUCAP 115* 139* 130*      Acute renal failure on CKD stage III  Creatinine peaked at 1.6 and improved with IV fluids, at this point IV fluids have been stopped by cardiology and patient has been placed on Lasix, monitor BMP. Creatinine appears to be at baseline for now. Clinically appears euvolemic at this time. Lasix per cardiology.     Acute on Chronic diastolic CHF currently compensated  Per cardiology patient getting Lasix, per cards placed on Po lasix upon DC with low dose K, Top XL increased to 100mg .   Chest pain around 2.45pm 06-18-12 lasting few seconds with exertion (severely deconditioned) , episode of SVT  Likely non cardiac, will check EKG, stable Echo, -ve Trop, continue ASA, B blocker, now pain free, Cards input appreciated discussed with Dr. Shirlee Latch on 06/19/2012    DC Instructions updated Follow with Primary MD Regina Contras A, MD in 3 days , do not take your Amaryl to  your primary care physician as to see to restart.  Get CBC, CMP, checked 3 days by Primary MD and again as instructed by your Primary MD. Get [name] 2 view Chest X ray done next visit.  Accuchecks 4 times/day, Once in AM empty stomach and then before each meal. Log in all results and show them to your Prim.MD in 3 days. If any glucose reading is under 80 or above 300 call your Prim MD immidiately. Follow Low glucose instructions for glucose under 80 as instructed.   Get Medicines reviewed and adjusted.  Please request your Prim.MD to go over all Hospital Tests and Procedure/Radiological results at the follow up, please get all Hospital records sent to your Prim MD by signing hospital release before you go home.  Activity: As tolerated with Full fall precautions use walker/cane & assistance as  needed   Diet:  Heart healthy low carbohydrate diet  Check your Weight same time everyday, if you gain over 2 pounds, or you develop in leg swelling, experience more shortness of breath or chest pain, call your Primary MD immediately. Follow Cardiac Low Salt Diet and 1.8 lit/day fluid restriction.  Disposition Home   DC Meds  Low mag replaced will recheck before DC    [name], [name]  Home Medication Instructions [id number]   Printed on:06/21/12 1478  Medication Information                    aspirin 81 MG EC tablet Take 81 mg by mouth daily.             sitaGLIPtan (JANUVIA) 50 MG tablet Take 50 mg by mouth daily.             senna (SENOKOT) 8.6 MG tablet Take 1 tablet by mouth daily.             lidocaine (LIDODERM) 5 % Place 1 patch onto the skin daily. Remove & Discard patch within 12 hours or as directed by MD            fluticasone (FLONASE) 50 MCG/ACT nasal spray Place 2 sprays into the nose daily. 50 mcg /act 2 sprays nasal once [name] day            omeprazole (PRILOSEC) 40 MG capsule Take 40 mg by mouth 2 (two) times daily.            montelukast (SINGULAIR) 10 MG tablet Take 10 mg by mouth at bedtime.             cilostazol (PLETAL) 100 MG tablet Take 100 mg by mouth 2 (two) times daily before [name] meal.            folic acid (FOLVITE) 1 MG tablet Take 1 mg by mouth daily.           temazepam (RESTORIL) 30 MG capsule Take 30 mg by mouth at bedtime as needed. For sleep           glucose blood test strip 1 each by Other route as needed. Use as instructed           colchicine (COLCRYS) 0.6 MG tablet Take 0.6 mg by mouth daily.           Hydrocodone-Acetaminophen 5-300 MG TABS Take 1 tablet by mouth 2 (two) times daily as needed. For pain           insulin glargine (LANTUS SOLOSTAR) 100 UNIT/ML injection Inject 8 Units into the skin at bedtime.  albuterol (PROVENTIL HFA;VENTOLIN HFA) 108 (90 BASE) MCG/ACT inhaler Inhale 2 puffs into the lungs  every 6 (six) hours as needed. For shortness of breath           triamcinolone cream (KENALOG) 0.5 % Apply 1 application topically daily as needed. For legs           diphenhydrAMINE (BENADRYL) 25 mg capsule Take 25 mg by mouth at bedtime as needed. For cough           diclofenac sodium (VOLTAREN) 1 % GEL Apply 1 application topically daily as needed. For leg pain           ergocalciferol (VITAMIN D2) 50000 UNITS capsule Take 50,000 Units by mouth once [name] week. On Monday           ketorolac (TORADOL) 10 MG tablet Take 1 tablet (10 mg total) by mouth every 6 (six) hours as needed for pain.           metoprolol succinate (TOPROL-XL) 100 MG 24 hr tablet Take 1 tablet (100 mg total) by mouth daily.           furosemide (LASIX) 40 MG tablet Take 1 tablet (40 mg total) by mouth daily.           potassium chloride (K-DUR) 10 MEQ tablet Take 1 tablet (10 mEq total) by mouth 2 (two) times daily.               Time Spent in minutes   35   Antibiotics    Anti-infectives     Start     Dose/Rate Route Frequency Ordered Stop   06/13/12 1645   ceFAZolin (ANCEF) IVPB 2 g/50 mL premix        2 g 100 mL/hr over 30 Minutes Intravenous Every 8 hours 06/13/12 1447 06/14/12 0816   06/13/12 0930   ceFAZolin (ANCEF) IVPB 2 g/50 mL premix        2 g 100 mL/hr over 30 Minutes Intravenous  Once 06/13/12 0918 06/13/12 0849          Scheduled Meds:    . albuterol  2 puff Inhalation TID  . aspirin  81 mg Oral Daily  . atorvastatin  20 mg Oral q1800  . colchicine  0.6 mg Oral Daily  . fluticasone  2 spray Each Nare Daily  . folic acid  1 mg Oral Daily  . heparin  5,000 Units Subcutaneous Q8H  . insulin aspart  0-9 Units Subcutaneous TID WC  . isosorbide mononitrate  30 mg Oral Daily  . lidocaine  1 patch Transdermal Q24H  . linagliptin  5 mg Oral Daily  . magnesium oxide  800 mg Oral BID  . metoprolol succinate  75 mg Oral Daily  . montelukast  10 mg Oral QHS  . pantoprazole  40 mg  Oral Daily  . polyethylene glycol  17 g Oral Daily  . senna  1 tablet Oral Daily  . DISCONTD: furosemide  40 mg Intravenous BID  . DISCONTD: magnesium oxide  800 mg Oral BID  . DISCONTD: magnesium sulfate 1 - 4 g bolus IVPB  1 g Intravenous Once   Continuous Infusions:   PRN Meds:.albuterol, ketorolac, ondansetron (ZOFRAN) IV, ondansetron, oxyCODONE-acetaminophen, temazepam, triamcinolone cream   DVT Prophylaxis    Heparin   Lab Results  Component Value Date   PLT 271 06/18/2012      Susa Raring K M.D on 06/21/2012 at 9:32 AM  Between 7am to 7pm - Pager -  [phone]  After 7pm go to [email or web address] - password [email or web address]  And look for the night coverage person covering for me after hours  [facility] Office  [phone]    Subjective:   Krystena Thilges today has, No headache, No chest pain, No abdominal pain - No Nausea, No new weakness tingling or numbness, No Cough - SOB.    Objective:   Filed Vitals:   06/20/12 2035 06/20/12 2129 06/21/12 0521 06/21/12 0907  BP:  136/72 120/76   Pulse:  100 92   Temp:  99 F (37.2 C) 99.4 F (37.4 C)   TempSrc:  Oral Oral   Resp:  18 18   Height:      Weight:   77.4 kg (170 lb 10.2 oz)   SpO2: 95% 93% 90% 91%    Wt Readings from Last 3 Encounters:  06/21/12 77.4 kg (170 lb 10.2 oz)  06/21/12 77.4 kg (170 lb 10.2 oz)  03/06/12 70.67 kg (155 lb 12.8 oz)     Intake/Output Summary (Last 24 hours) at 06/21/12 0932 Last data filed at 06/21/12 0527  Gross per 24 hour  Intake    240 ml  Output    470 ml  Net   -230 ml    Exam Awake Alert, Oriented X 3, No new F.N deficits, Normal affect [place].AT,PERRAL Supple Neck,No JVD, No cervical lymphadenopathy appriciated.  Symmetrical Chest wall movement, Good air movement bilaterally, CTAB RRR,No Gallops,Rubs or new Murmurs, No Parasternal Heave +ve B.Sounds, Abd Soft, Non tender, No organomegaly appriciated, No rebound - guarding or rigidity. No Cyanosis, Clubbing or  edema, No new Rash or bruise     Data Review   Micro Results Recent Results (from the past 240 hour(s))  CULTURE, BLOOD (ROUTINE X 2)     Status: Normal (Preliminary result)   Collection Time   06/16/12 10:31 AM      Component Value Range Status Comment   Specimen Description BLOOD HAND LEFT   Final    Special Requests BOTTLES DRAWN AEROBIC ONLY 7CC   Final    Culture  Setup Time 06/16/2012 18:52   Final    Culture     Final    Value:        BLOOD CULTURE RECEIVED NO GROWTH TO DATE CULTURE WILL BE HELD FOR 5 DAYS BEFORE ISSUING [name] FINAL NEGATIVE REPORT   Report Status PENDING   Incomplete   CULTURE, BLOOD (ROUTINE X 2)     Status: Normal (Preliminary result)   Collection Time   06/16/12 10:38 AM      Component Value Range Status Comment   Specimen Description BLOOD ARM RIGHT   Final    Special Requests     Final    Value: BOTTLES DRAWN AEROBIC AND ANAEROBIC RED 5CC, BLUE 10CC   Culture  Setup Time 06/16/2012 18:52   Final    Culture     Final    Value:        BLOOD CULTURE RECEIVED NO GROWTH TO DATE CULTURE WILL BE HELD FOR 5 DAYS BEFORE ISSUING [name] FINAL NEGATIVE REPORT   Report Status PENDING   Incomplete   URINE CULTURE     Status: Normal   Collection Time   06/16/12  4:52 PM      Component Value Range Status Comment   Specimen Description URINE, CLEAN CATCH   Final    Special Requests NONE   Final    Culture  Setup Time 06/16/2012 22:00  Final    Colony Count 2,000 COLONIES/ML   Final    Culture INSIGNIFICANT GROWTH   Final    Report Status 06/17/2012 FINAL   Final     Radiology Reports Dg Chest 2 View  06/17/2012  *RADIOLOGY REPORT*  Clinical Data: Shortness of breath and weakness.  CHEST - 2 VIEW  Comparison: PA and lateral chest 06/15/2012.  Findings: Lung volumes are low.  Small bilateral pleural effusions and associated basilar atelectasis persist but appear slightly decreased.  No pneumothorax is identified.  Heart size is upper normal.  IMPRESSION: Slight decrease  in small bilateral pleural effusions.  No other change.   Original Report Authenticated By: Bernadene Bell. Maricela Curet, M.D.    Dg Chest 2 View  06/15/2012  *RADIOLOGY REPORT*  Clinical Data: Asthma.  Hypoxia.  Chest pain and shortness of breath.  CHEST - 2 VIEW  Comparison: Chest x-ray 06/05/2012.  Findings: Lung volumes have decreased.  Worsening bibasilar opacities compatible with increasing atelectasis and/or consolidation in the lower lobes of the lungs bilaterally. Increasing small bilateral pleural effusions.  There is some crowding of the pulmonary vasculature, accentuated by low lung volumes, without frank pulmonary edema.  Heart size appears borderline enlarged. The patient is rotated to the right on today's exam, resulting in distortion of the mediastinal contours and reduced diagnostic sensitivity and specificity for mediastinal pathology.  Atherosclerosis in the thoracic aorta.  IMPRESSION: 1.  Decreasing lung volumes with worsened bibasilar aeration which may represent areas of atelectasis and/or consolidation, with superimposed small bilateral pleural effusions. 2.  Atherosclerosis.   Original Report Authenticated By: Florencia Reasons, M.D.    Dg Chest 2 View  06/05/2012  *RADIOLOGY REPORT*  Clinical Data: Cough and congestion; emphysema  CHEST - 2 VIEW  Comparison: May 11, 2011  Findings: There is underlying emphysema.  There is scarring in the bases.  There is no edema or consolidation.  The heart size is normal.  The pulmonary vascularity reflects underlying emphysema.  There is thoracic levoscoliosis.  There is anterior wedging of several mid thoracic vertebral bodies, stable  .  IMPRESSION: Underlying emphysema.  Bibasilar scarring.  No edema or consolidation.   Original Report Authenticated By: Arvin Collard. WOODRUFF III, M.D.     CBC  Lab 06/18/12 0520 06/17/12 0635 06/15/12 0445  WBC 10.7* 12.9* 16.3*  HGB 8.6* 8.9* 10.2*  HCT 28.0* 29.1* 33.5*  PLT 271 255 236  MCV 80.9 81.5  81.7  MCH 24.9* 24.9* 24.9*  MCHC 30.7 30.6 30.4  RDW 15.4 15.2 15.6*  LYMPHSABS -- -- --  MONOABS -- -- --  EOSABS -- -- --  BASOSABS -- -- --  BANDABS -- -- --    Chemistries   Lab 06/21/12 0620 06/20/12 0920 06/19/12 0240 06/18/12 0520 06/17/12 0635 06/15/12 0445  NA 137 138 137 134* 134* --  K 3.8 3.6 4.0 4.4 4.4 --  CL 98 98 102 104 101 --  CO2 30 30 25 24 24  --  GLUCOSE 125* 151* 169* 72 56* --  BUN 14 14 15 18  24* --  CREATININE 1.34* 1.36* 1.17* 1.32* 1.56* --  CALCIUM 8.7 9.1 8.8 8.7 8.6 --  MG -- 1.3* -- -- -- --  AST -- -- -- -- -- 18  ALT -- -- -- -- -- <5  ALKPHOS -- -- -- -- -- 67  BILITOT -- -- -- -- -- 0.4   ------------------------------------------------------------------------------------------------------------------ estimated creatinine clearance is 37.5 ml/min (by C-G formula based on Cr of  1.34). ------------------------------------------------------------------------------------------------------------------ No results found for this basename: HGBA1C:2 in the last 72 hours ------------------------------------------------------------------------------------------------------------------  [facility] 06/19/12 0240  CHOL 123  HDL 31*  LDLCALC 64  TRIG 086  CHOLHDL 4.0  LDLDIRECT --   ------------------------------------------------------------------------------------------------------------------ No results found for this basename: TSH,T4TOTAL,FREET3,T3FREE,THYROIDAB in the last 72 hours ------------------------------------------------------------------------------------------------------------------ No results found for this basename: VITAMINB12:2,FOLATE:2,FERRITIN:2,TIBC:2,IRON:2,RETICCTPCT:2 in the last 72 hours  Coagulation profile No results found for this basename: INR:5,PROTIME:5 in the last 168 hours  No results found for this basename: DDIMER:2 in the last 72 hours  Cardiac Enzymes  Lab 06/19/12 0825 06/19/12 0217 06/18/12 2107  CKMB  -- -- --  TROPONINI <0.30 <0.30 <0.30  MYOGLOBIN -- -- --   ------------------------------------------------------------------------------------------------------------------ No components found with this basename: POCBNP:3

## 2012-06-21 NOTE — Progress Notes (Signed)
Patient discharged to home with instructions given together with her home health aid, verbalized understanding.

## 2012-06-21 NOTE — Discharge Summary (Signed)
Patient ID: [name] [id number] 65 y.o. [birth year]  06/13/2012  Discharge date and time: June 21, 2012  Admitting Physician: Ernestene Mention  Discharge Physician: Ernestene Mention  Admission Diagnoses: incisional hernia  Discharge Diagnoses: Incarcerated ventral incisional hernia, extensive intra-abdominal adhesions COPD Obstructive sleep apnea Diastolic congestive heart failure, chronic Diabetes mellitus, type II Acute renal failure on CK D., stage III Compensated alcohol or cirrhosis Hypertension Coronary disease History laparoscopic right colectomy for villous adenoma History abdominal hysterectomy  Operations: Procedure(s): LAPAROSCOPIC VENTRAL HERNIA INSERTION OF MESH LYSIS OF ADHESION  Admission Condition: fair  Discharged Condition: fair  Indication for Admission: [name] is a 66 y.o. female.  This patient underwent laparoscopic-assisted right colectomy by me on April 22, 2011. She had a villous adenoma. She has had an abdominal hysterectomy in the past through a midline incision is well.  Her comorbidities include obesity, congestive heart failure, obstructive sleep apnea, COPD, compensated cirrhosis, hypertension, coronary artery disease, insulin-dependent diabetes mellitus, and peripheral vascular disease. She is followed by Dr. Nanetta Batty and Dr. Fleet Contras.  Examination reveals a protuberant abdomen with at least 2 hernia defects just above the umbilicus.  I sent her for a CT scan which shows a ventral incisional hernia in the mid upper abdomen contained fat and a little of the transverse colon. There was no inflammation. There was no obstruction. There was no ascites. There were no liver lesions. There were multiple renal lesions. She has subsequently seen Dr. Su Grand. A MRI showed no worrisome renal lesions. Changes of chronic pancreatitis are present.  She is strongly motivated to have her hernia is repaired. She is aware that she  is at increased risk for recurrence and surgical complications and medical complications. She is brought to the Hospital and the operating room electively.   Hospital Course: On the day of admission the patient was taken to the operating room and underwent combined laparoscopic and open repair of a very complex ventral incisional hernia. She had extensive adhesions. The defects were large and required component separation technique to relax the fascia, but once this was done we were able to implant mesh and bring the fascia together in the midline. Postoperatively she did fairly well from a surgical standpoint. She had no wound infection. Her ileus eventually resolved and she resumed diet and bowel function. She had some problems with low blood sugar and had to be taken off of Amaryl and Lantus temporarily. This stabilized once diet was resumed and she is discharged home on Lantus and gJanuvia but we have discontinued the Amaryl. She was followed by [facility]. She had some problems with exacerbation of congestive heart failure. This required fairly aggressive diuresis in consultation by  cardiology. She has some problems with transient tachycardia and was worked up for pulmonary embolism and that was negative. On the day of discharge patient was alert and stable and ready to go home but somewhat deconditioned. Home health nursing, home health PT and home health OT were arranged.  We discontinued her Amaryl. We increased her dose of Toprol XL to 75 mg per day. Otherwise her medications are unchanged  She was advised to return to see Dr. Derrell Lolling in one week for wound and staple removal and drain check.  She was advised to followup with [facility] heart vascular [facility] for cardiac evaluation and possible outpatient stress test  She was advised to followup with Dr. Fleet Contras soon for continued management of her complex medical problems  Diet and  activities were discussed.  On discharge  she was given a new prescription for Toradol for pain and a new prescription for Toprol-XL 75 mg daily. Otherwise medications are unchanged.  Consults: cardiology and [facility]  Significant Diagnostic Studies: Chest x-ray, venous Doppler study lower extremities.  Treatments: surgery: Combined laparoscopic and open repair of incarcerated ventral hernia with mesh  Disposition: Home  Patient Instructions:   [name], [name]  Home Medication Instructions [id number]   Printed on:06/21/12 7846  Medication Information                    aspirin 81 MG EC tablet Take 81 mg by mouth daily.             sitaGLIPtan (JANUVIA) 50 MG tablet Take 50 mg by mouth daily.             senna (SENOKOT) 8.6 MG tablet Take 1 tablet by mouth daily.             lidocaine (LIDODERM) 5 % Place 1 patch onto the skin daily. Remove & Discard patch within 12 hours or as directed by MD            fluticasone (FLONASE) 50 MCG/ACT nasal spray Place 2 sprays into the nose daily. 50 mcg /act 2 sprays nasal once a day            omeprazole (PRILOSEC) 40 MG capsule Take 40 mg by mouth 2 (two) times daily.            montelukast (SINGULAIR) 10 MG tablet Take 10 mg by mouth at bedtime.             cilostazol (PLETAL) 100 MG tablet Take 100 mg by mouth 2 (two) times daily before a meal.            folic acid (FOLVITE) 1 MG tablet Take 1 mg by mouth daily.           temazepam (RESTORIL) 30 MG capsule Take 30 mg by mouth at bedtime as needed. For sleep           glucose blood test strip 1 each by Other route as needed. Use as instructed           colchicine (COLCRYS) 0.6 MG tablet Take 0.6 mg by mouth daily.           Hydrocodone-Acetaminophen 5-300 MG TABS Take 1 tablet by mouth 2 (two) times daily as needed. For pain           insulin glargine (LANTUS SOLOSTAR) 100 UNIT/ML injection Inject 8 Units into the skin at bedtime.            albuterol (PROVENTIL HFA;VENTOLIN HFA) 108 (90 BASE)  MCG/ACT inhaler Inhale 2 puffs into the lungs every 6 (six) hours as needed. For shortness of breath           triamcinolone cream (KENALOG) 0.5 % Apply 1 application topically daily as needed. For legs           diphenhydrAMINE (BENADRYL) 25 mg capsule Take 25 mg by mouth at bedtime as needed. For cough           diclofenac sodium (VOLTAREN) 1 % GEL Apply 1 application topically daily as needed. For leg pain           ergocalciferol (VITAMIN D2) 50000 UNITS capsule Take 50,000 Units by mouth once a week. On Monday  metoprolol succinate (TOPROL-XL) 25 MG 24 hr tablet Take 3 tablets (75 mg total) by mouth daily.           ketorolac (TORADOL) 10 MG tablet Take 1 tablet (10 mg total) by mouth every 6 (six) hours as needed for pain.             Activity: no lifting more than 10 pounds for 6 weeks. No driving. Diet: cardiac diet Wound Care:  sponge bathe. Keep wound and drain site is clean and dry. Keep written record of drainage of brain to office with you.  Follow-up:  With Dr. Derrell Lolling in 1 week.  Signed: [name]. Derrell Lolling, M.D., FACS General and minimally invasive surgery Breast and Colorectal Surgery  06/21/2012, 6:33 AM

## 2012-06-21 NOTE — Progress Notes (Signed)
[facility]     Delivered portable oxygen tank to room for patient to DC with.   Orlan Leavens [phone]

## 2012-06-21 NOTE — Progress Notes (Signed)
8 Days Post-Op  Subjective: Stable and wants to go home. Tolerating diet and having bowel movements. No wound problems. Ambulating some.  [facility] hospitalist have recommended discontinuing Amaryl but continue home dose Lantus and home dose Januvia  Cardiology has recommended that she needs to take Lasix  when necessary basis only and followup with [facility] part and vascular center for probable stress test. Toprol-XL was increased to 75 mg per day. Otherwise we'll continue same home medications.  Objective: Vital signs in last 24 hours: Temp:  [98.3 F (36.8 C)-99.4 F (37.4 C)] 99.4 F (37.4 C) (10/24 0521) Pulse Rate:  [90-100] 92  (10/24 0521) Resp:  [16-18] 18  (10/24 0521) BP: (120-144)/(72-76) 120/76 mmHg (10/24 0521) SpO2:  [90 %-96 %] 90 % (10/24 0521) Weight:  [158 lb 12.8 oz (72.031 kg)-170 lb 10.2 oz (77.4 kg)] 170 lb 10.2 oz (77.4 kg) (10/24 0521) Last BM Date: 06/19/12  Intake/Output from previous day: 10/23 0701 - 10/24 0700 In: 368 [P.O.:360; IV Piggyback:8] Out: 570 [Urine:550; Drains:20] Intake/Output this shift: Total I/O In: -  Out: 20 [Drains:20]  General appearance: Alert. The condition. Oriented. In no distress. Mental status normal. Resp: Lungs are clear to auscultation anteriorly. GI: Abdomen is soft, protuberant but at baseline. Active bowel sounds. Minimal tenderness. Midline incision looks clean. JP drainage minimal, serosanguineous.  Lab Results:  Results for orders placed during the hospital encounter of 06/13/12 (from the past 24 hour(s))  GLUCOSE, CAPILLARY     Status: Abnormal   Collection Time   06/20/12  7:47 AM      Component Value Range   Glucose-Capillary 122 (*) 70 - 99 mg/dL   Comment 1 Notify RN    BASIC METABOLIC PANEL     Status: Abnormal   Collection Time   06/20/12  9:20 AM      Component Value Range   Sodium 138  135 - 145 mEq/L   Potassium 3.6  3.5 - 5.1 mEq/L   Chloride 98  96 - 112 mEq/L   CO2 30  19 - 32 mEq/L   Glucose, Bld 151 (*) 70 - 99 mg/dL   BUN 14  6 - 23 mg/dL   Creatinine, Ser 1.61 (*) 0.50 - 1.10 mg/dL   Calcium 9.1  8.4 - 09.6 mg/dL   GFR calc non Af Amer 39 (*) >90 mL/min   GFR calc Af Amer 46 (*) >90 mL/min  MAGNESIUM     Status: Abnormal   Collection Time   06/20/12  9:20 AM      Component Value Range   Magnesium 1.3 (*) 1.5 - 2.5 mg/dL  GLUCOSE, CAPILLARY     Status: Abnormal   Collection Time   06/20/12 12:26 PM      Component Value Range   Glucose-Capillary 149 (*) 70 - 99 mg/dL   Comment 1 Notify RN    GLUCOSE, CAPILLARY     Status: Abnormal   Collection Time   06/20/12  5:10 PM      Component Value Range   Glucose-Capillary 130 (*) 70 - 99 mg/dL   Comment 1 Notify RN    GLUCOSE, CAPILLARY     Status: Abnormal   Collection Time   06/20/12  9:24 PM      Component Value Range   Glucose-Capillary 139 (*) 70 - 99 mg/dL     Studies/Results: @RISRSLT24 @     . albuterol  2 puff Inhalation TID  . aspirin  81 mg Oral Daily  . atorvastatin  20 mg Oral q1800  . colchicine  0.6 mg Oral Daily  . fluticasone  2 spray Each Nare Daily  . folic acid  1 mg Oral Daily  . heparin  5,000 Units Subcutaneous Q8H  . insulin aspart  0-9 Units Subcutaneous TID WC  . isosorbide mononitrate  30 mg Oral Daily  . lidocaine  1 patch Transdermal Q24H  . linagliptin  5 mg Oral Daily  . magnesium oxide  800 mg Oral BID  . metoprolol succinate  75 mg Oral Daily  . montelukast  10 mg Oral QHS  . pantoprazole  40 mg Oral Daily  . polyethylene glycol  17 g Oral Daily  . senna  1 tablet Oral Daily  . DISCONTD: furosemide  40 mg Intravenous BID  . DISCONTD: magnesium sulfate 1 - 4 g bolus IVPB  1 g Intravenous Once     Assessment/Plan: s/p Procedure(s): LAPAROSCOPIC VENTRAL HERNIA INSERTION OF MESH LYSIS OF ADHESION  POD #78 Stable. She is basically doing very well from her abdominal surgery, but is significantly deconditioned.  Mobilize more  PT involved  Discharge home  today. Home health nursing, home health PT, home health a OT ordered next line return to see me in office next week for staple removal and probable drain removal Diet and activities discussed .   Atalectasis, history OSA, COPD.Marland Kitchen  Doing better.  Albuterol inhaler every 6 hours  Flutter valve  Incentive spirometry  Ambulate  off narcotics.   Multiple comorbidities:  Obesity  Congestive heart failure followed by Nanetta Batty.  Obstructive sleep apnea  COPD  Compensated cirrhosis  Hypertension  Coronary artery disease metoprolol increased to 75 mg. By cardiology  Diabetes mellitus some episodes of hypoglycemia, so Lantus insulin ..held , SSI reduced amaryl held  Peripheral vascular disease.  History laparoscopic right colectomy for villous adenoma  History abdominal hysterectomy.   Disp: Discharge home today. Home health nursing, home health PT, home health OT.. See me in my office in 7-8 days for staple removal and wound check Diet and activities discussed Discontinue her Amaryl Continue Lantus and Januvia Advised patient followup with Dr. Nanetta Batty and Dr. Concepcion Elk as outpatient     LOS: 8 days    Keara Pagliarulo M 06/21/2012  . .prob

## 2012-06-22 ENCOUNTER — Telehealth (INDEPENDENT_AMBULATORY_CARE_PROVIDER_SITE_OTHER): Payer: Self-pay | Admitting: General Surgery

## 2012-06-22 LAB — CULTURE, BLOOD (ROUTINE X 2): Culture: NO GROWTH

## 2012-06-22 NOTE — Telephone Encounter (Signed)
Angie called and said the patient refused to allow her to change the dressing from her wound (surgical). Patient told her that it was just changed yesterday and she would have her "aide" come back and do it for her. Angie said that she will send a nurse to the home tomorrow to check on her and attempt to change the dressing.

## 2012-07-02 ENCOUNTER — Ambulatory Visit (INDEPENDENT_AMBULATORY_CARE_PROVIDER_SITE_OTHER): Payer: PRIVATE HEALTH INSURANCE | Admitting: General Surgery

## 2012-07-02 ENCOUNTER — Encounter (INDEPENDENT_AMBULATORY_CARE_PROVIDER_SITE_OTHER): Payer: Self-pay | Admitting: General Surgery

## 2012-07-02 VITALS — BP 128/76 | HR 72 | Temp 97.8°F | Resp 16 | Ht 60.0 in | Wt 155.1 lb

## 2012-07-02 DIAGNOSIS — K432 Incisional hernia without obstruction or gangrene: Secondary | ICD-10-CM

## 2012-07-02 NOTE — Progress Notes (Signed)
Patient ID: [name], female   DOB: 1946-01-03, 66 y.o.   MRN: [id number] History: This is a patient with numerous medical problems who underwent laparoscopic lysis of adhesions requiring 75 minutes, laparoscopic-assisted ventral hernia repair with parietex mesh, unilateral component separation technique. Date of surgery 06/13/2012. Visiting nurses are following her. She does not  have a written record the drainage but states it is almost nothing. She is eating fine. Having bowel movements.  Exam: Patient was well. No distress. Midline incision is healing without any obvious signs of infection or fluid collection. I removed every other staple. I removed the left upper quadrant drain the left the right upper quadrant drain in place. Redressed the wound.  Assessment: Complex ventral hernia, recovering uneventfully in the early postop period  Plan: Diet and activities discussed. No heavy lifting. No shower Return in one week hopefully for removal of all staples and the drain.   [name]. Derrell Lolling, M.D., [facility] General and Minimally invasive Surgery Breast and Colorectal Surgery Office:   [phone] Pager:   [phone]

## 2012-07-02 NOTE — Patient Instructions (Signed)
Do not lift anything  heavy or take a shower yet.  Take a walk around the block every day.  Keep a record of the drainage.  Return to see Dr. Derrell Lolling in one week for wound check. Hopefully we will remove the drain and staples then.

## 2012-07-03 ENCOUNTER — Encounter: Payer: Self-pay | Admitting: Internal Medicine

## 2012-07-04 ENCOUNTER — Telehealth (INDEPENDENT_AMBULATORY_CARE_PROVIDER_SITE_OTHER): Payer: Self-pay

## 2012-07-04 NOTE — Telephone Encounter (Signed)
Close encounter 

## 2012-07-10 ENCOUNTER — Encounter (INDEPENDENT_AMBULATORY_CARE_PROVIDER_SITE_OTHER): Payer: Self-pay | Admitting: General Surgery

## 2012-07-10 ENCOUNTER — Ambulatory Visit (INDEPENDENT_AMBULATORY_CARE_PROVIDER_SITE_OTHER): Payer: PRIVATE HEALTH INSURANCE | Admitting: General Surgery

## 2012-07-10 VITALS — BP 136/80 | HR 70 | Temp 97.3°F | Resp 16 | Ht 60.0 in | Wt 156.2 lb

## 2012-07-10 DIAGNOSIS — K432 Incisional hernia without obstruction or gangrene: Secondary | ICD-10-CM

## 2012-07-10 NOTE — Patient Instructions (Signed)
We removed the last drain today, so you may begin taking a shower tomorrow night.  Take a long walk every day. No sports or lifting.  You may do light housework  Return to see Dr. Derrell Lolling in 2 months.

## 2012-07-10 NOTE — Progress Notes (Signed)
Patient ID: [name], female   DOB: 21-Mar-1946, 66 y.o.   MRN: [id number] History: This patient recently underwent complex ventral hernia repair with component separation technique and mesh inlay. She is making reasonable progress. Drainage is down to less than 10 cc per day from the remaining drain.  Exam: Patient looks well, alert, in no distress. Vital signs normal. Abdomen soft. Midline wound without infection. Small amount of scattered epidermo- lysis. Staples removed. Drain removed. No sign of infection.  Assessment: Complex ventral incisional hernia, recovering slowly following complex repair Multiple comorbidities  Plan: Take a long walk everyday. No lifting or sports indefinitely Return to see me in 2 months.   [name]. Derrell Lolling, M.D., [facility] General and Minimally invasive Surgery Breast and Colorectal Surgery Office:   [phone] Pager:   [phone]

## 2012-07-23 ENCOUNTER — Encounter (INDEPENDENT_AMBULATORY_CARE_PROVIDER_SITE_OTHER): Payer: Self-pay | Admitting: General Surgery

## 2012-07-23 ENCOUNTER — Telehealth (INDEPENDENT_AMBULATORY_CARE_PROVIDER_SITE_OTHER): Payer: Self-pay | Admitting: General Surgery

## 2012-07-23 NOTE — Telephone Encounter (Signed)
Faxed signed authorization to [facility] attn: Dellis Anes fax # [phone], home health certification/plan of care and orders for care of drain site. Confirmation received and orders sent to medical records to be scanned into chart.

## 2012-08-07 ENCOUNTER — Telehealth (INDEPENDENT_AMBULATORY_CARE_PROVIDER_SITE_OTHER): Payer: Self-pay | Admitting: General Surgery

## 2012-08-07 NOTE — Telephone Encounter (Signed)
Called patient back based on message left and advised she should be fine with cooking Christmas dinner, but to still have someone there to pick up the things that are heavy. I advised the patient to make sure to listen to her body and if she feels tired to rest. Patient agreed.

## 2012-09-11 ENCOUNTER — Encounter (INDEPENDENT_AMBULATORY_CARE_PROVIDER_SITE_OTHER): Payer: Self-pay | Admitting: General Surgery

## 2012-09-11 ENCOUNTER — Ambulatory Visit (INDEPENDENT_AMBULATORY_CARE_PROVIDER_SITE_OTHER): Payer: PRIVATE HEALTH INSURANCE | Admitting: General Surgery

## 2012-09-11 VITALS — BP 132/80 | HR 76 | Temp 97.8°F | Resp 18 | Ht 60.0 in | Wt 152.2 lb

## 2012-09-11 DIAGNOSIS — K432 Incisional hernia without obstruction or gangrene: Secondary | ICD-10-CM

## 2012-09-11 NOTE — Patient Instructions (Signed)
Your hernia repair has healed normally without any obvious complication.  You may resume normal activities. There are no specific restrictions on you. Use your  common sense and don't do anything that is very strenuous. You may walk up and down stairs. You may walk around the block. You may go to the grocery store and bring your groceries in. You may drive a car.  Return to see Dr. Derrell Lolling if further problems arise.

## 2012-09-11 NOTE — Progress Notes (Signed)
Patient ID: [name], female   DOB: 1946/08/19, 67 y.o.   MRN: [id number] History: On 06/13/2012 this patient underwent a laparoscopic lysis of adhesions, extensive;    laparoscopic-assisted ventral incisional hernia with mesh;    unilateral component separation technique. Drains were removed on November 12. She has done well. She states that she is very glad that she had the hernia repaired and she feels much better. She has multiple comorbidities but is reasonably stable at this time. These include obesity, congestive heart failure, sleep apnea, COPD, compensated cirrhosis, hypertension, coronary artery disease, is some dependent diabetes mellitus and peripheral vascular disease. She is followed by Dr. Nanetta Batty and Dr. Fleet Contras.  Exam: Patient looks well. No distress. She is examined supine and standing. Incisions are well healed. The hernia repair is intact. There are no fluid collections.  Assessment: Complex ventral incisional hernia, recovering without obvious complication following complex repair Current multiple comorbidities  Plan: Diet and activities discussed. Return to see me if further problems arise.   [name]. Derrell Lolling, M.D., [facility] General and Minimally invasive Surgery Breast and Colorectal Surgery Office:   [phone] Pager:   [phone]

## 2012-09-13 ENCOUNTER — Encounter (INDEPENDENT_AMBULATORY_CARE_PROVIDER_SITE_OTHER): Payer: Self-pay | Admitting: General Surgery

## 2012-09-13 NOTE — Progress Notes (Signed)
Faxed on 09/12/12 signed wound care order by Dr. Derrell Lolling to [facility] attention [facility] Fax # [phone], confirmation received.

## 2012-09-21 ENCOUNTER — Encounter (INDEPENDENT_AMBULATORY_CARE_PROVIDER_SITE_OTHER): Payer: Self-pay | Admitting: General Surgery

## 2012-09-21 NOTE — Progress Notes (Signed)
Faxed signed face-to-face encounter by Dr. Derrell Lolling to the attention of Nelle Don at [facility] fax # [phone]. Confirmation received. Encounter sent to medical records to be scanned into the chart as media.

## 2012-10-03 ENCOUNTER — Other Ambulatory Visit: Payer: Self-pay | Admitting: Internal Medicine

## 2012-10-03 DIAGNOSIS — Z1231 Encounter for screening mammogram for malignant neoplasm of breast: Secondary | ICD-10-CM

## 2012-10-08 ENCOUNTER — Other Ambulatory Visit (HOSPITAL_COMMUNITY): Payer: Self-pay | Admitting: Cardiovascular Disease

## 2012-10-08 DIAGNOSIS — I739 Peripheral vascular disease, unspecified: Secondary | ICD-10-CM

## 2012-10-25 ENCOUNTER — Ambulatory Visit (HOSPITAL_COMMUNITY)
Admission: RE | Admit: 2012-10-25 | Discharge: 2012-10-25 | Disposition: A | Discharge status: Home / Self Care | Payer: PRIVATE HEALTH INSURANCE | Source: Ambulatory Visit | Attending: Cardiovascular Disease | Admitting: Cardiovascular Disease

## 2012-10-25 DIAGNOSIS — I70209 Unspecified atherosclerosis of native arteries of extremities, unspecified extremity: Secondary | ICD-10-CM | POA: Insufficient documentation

## 2012-10-25 DIAGNOSIS — I70219 Atherosclerosis of native arteries of extremities with intermittent claudication, unspecified extremity: Secondary | ICD-10-CM | POA: Insufficient documentation

## 2012-10-25 NOTE — Progress Notes (Signed)
Bilateral Lower Ext. Arterial Duplex Completed. Roxana Lai D  

## 2012-11-12 ENCOUNTER — Ambulatory Visit: Payer: PRIVATE HEALTH INSURANCE

## 2012-11-15 ENCOUNTER — Encounter (HOSPITAL_COMMUNITY): Payer: Self-pay | Admitting: *Deleted

## 2012-11-15 ENCOUNTER — Inpatient Hospital Stay (HOSPITAL_COMMUNITY)
Admission: EM | Admit: 2012-11-15 | Discharge: 2012-11-19 | DRG: 313 | Disposition: A | Discharge status: Home Health | Payer: PRIVATE HEALTH INSURANCE | Attending: Internal Medicine | Admitting: Internal Medicine

## 2012-11-15 ENCOUNTER — Emergency Department (HOSPITAL_COMMUNITY): Payer: PRIVATE HEALTH INSURANCE

## 2012-11-15 DIAGNOSIS — Z8619 Personal history of other infectious and parasitic diseases: Secondary | ICD-10-CM

## 2012-11-15 DIAGNOSIS — N179 Acute kidney failure, unspecified: Secondary | ICD-10-CM | POA: Diagnosis present

## 2012-11-15 DIAGNOSIS — N183 Chronic kidney disease, stage 3 unspecified: Secondary | ICD-10-CM | POA: Diagnosis present

## 2012-11-15 DIAGNOSIS — M940 Chondrocostal junction syndrome [Tietze]: Secondary | ICD-10-CM | POA: Diagnosis present

## 2012-11-15 DIAGNOSIS — I509 Heart failure, unspecified: Secondary | ICD-10-CM | POA: Diagnosis present

## 2012-11-15 DIAGNOSIS — J4489 Other specified chronic obstructive pulmonary disease: Secondary | ICD-10-CM | POA: Diagnosis present

## 2012-11-15 DIAGNOSIS — Z90710 Acquired absence of both cervix and uterus: Secondary | ICD-10-CM

## 2012-11-15 DIAGNOSIS — Z7982 Long term (current) use of aspirin: Secondary | ICD-10-CM

## 2012-11-15 DIAGNOSIS — R1110 Vomiting, unspecified: Secondary | ICD-10-CM | POA: Diagnosis present

## 2012-11-15 DIAGNOSIS — M8448XA Pathological fracture, other site, initial encounter for fracture: Secondary | ICD-10-CM | POA: Diagnosis present

## 2012-11-15 DIAGNOSIS — Z860100 Personal history of colon polyps, unspecified: Secondary | ICD-10-CM

## 2012-11-15 DIAGNOSIS — F3289 Other specified depressive episodes: Secondary | ICD-10-CM | POA: Diagnosis present

## 2012-11-15 DIAGNOSIS — F411 Generalized anxiety disorder: Secondary | ICD-10-CM | POA: Diagnosis present

## 2012-11-15 DIAGNOSIS — Z85038 Personal history of other malignant neoplasm of large intestine: Secondary | ICD-10-CM

## 2012-11-15 DIAGNOSIS — Z87891 Personal history of nicotine dependence: Secondary | ICD-10-CM

## 2012-11-15 DIAGNOSIS — Z880 Allergy status to penicillin: Secondary | ICD-10-CM

## 2012-11-15 DIAGNOSIS — Z79899 Other long term (current) drug therapy: Secondary | ICD-10-CM

## 2012-11-15 DIAGNOSIS — I739 Peripheral vascular disease, unspecified: Secondary | ICD-10-CM | POA: Diagnosis present

## 2012-11-15 DIAGNOSIS — K7030 Alcoholic cirrhosis of liver without ascites: Secondary | ICD-10-CM | POA: Diagnosis present

## 2012-11-15 DIAGNOSIS — F1021 Alcohol dependence, in remission: Secondary | ICD-10-CM | POA: Diagnosis present

## 2012-11-15 DIAGNOSIS — M1990 Unspecified osteoarthritis, unspecified site: Secondary | ICD-10-CM | POA: Diagnosis present

## 2012-11-15 DIAGNOSIS — M109 Gout, unspecified: Secondary | ICD-10-CM | POA: Diagnosis present

## 2012-11-15 DIAGNOSIS — M129 Arthropathy, unspecified: Secondary | ICD-10-CM | POA: Diagnosis present

## 2012-11-15 DIAGNOSIS — Z794 Long term (current) use of insulin: Secondary | ICD-10-CM

## 2012-11-15 DIAGNOSIS — H409 Unspecified glaucoma: Secondary | ICD-10-CM | POA: Diagnosis present

## 2012-11-15 DIAGNOSIS — I5032 Chronic diastolic (congestive) heart failure: Secondary | ICD-10-CM | POA: Diagnosis present

## 2012-11-15 DIAGNOSIS — E119 Type 2 diabetes mellitus without complications: Secondary | ICD-10-CM | POA: Diagnosis present

## 2012-11-15 DIAGNOSIS — Z86718 Personal history of other venous thrombosis and embolism: Secondary | ICD-10-CM

## 2012-11-15 DIAGNOSIS — Z8719 Personal history of other diseases of the digestive system: Secondary | ICD-10-CM

## 2012-11-15 DIAGNOSIS — R0789 Other chest pain: Principal | ICD-10-CM | POA: Diagnosis present

## 2012-11-15 DIAGNOSIS — K279 Peptic ulcer, site unspecified, unspecified as acute or chronic, without hemorrhage or perforation: Secondary | ICD-10-CM

## 2012-11-15 DIAGNOSIS — Z9049 Acquired absence of other specified parts of digestive tract: Secondary | ICD-10-CM

## 2012-11-15 DIAGNOSIS — R079 Chest pain, unspecified: Secondary | ICD-10-CM

## 2012-11-15 DIAGNOSIS — I129 Hypertensive chronic kidney disease with stage 1 through stage 4 chronic kidney disease, or unspecified chronic kidney disease: Secondary | ICD-10-CM | POA: Diagnosis present

## 2012-11-15 DIAGNOSIS — Z8711 Personal history of peptic ulcer disease: Secondary | ICD-10-CM

## 2012-11-15 DIAGNOSIS — Z8701 Personal history of pneumonia (recurrent): Secondary | ICD-10-CM

## 2012-11-15 DIAGNOSIS — G4730 Sleep apnea, unspecified: Secondary | ICD-10-CM | POA: Diagnosis present

## 2012-11-15 LAB — CBC WITH DIFFERENTIAL/PLATELET
Basophils Absolute: 0 10*3/uL (ref 0.0–0.1)
Lymphocytes Relative: 30 % (ref 12–46)
Lymphs Abs: 3 10*3/uL (ref 0.7–4.0)
MCV: 79.5 fL (ref 78.0–100.0)
Neutro Abs: 6.2 10*3/uL (ref 1.7–7.7)
Neutrophils Relative %: 62 % (ref 43–77)
Platelets: 237 10*3/uL (ref 150–400)
RBC: 4.84 MIL/uL (ref 3.87–5.11)
RDW: 15.1 % (ref 11.5–15.5)
WBC: 10 10*3/uL (ref 4.0–10.5)

## 2012-11-15 LAB — CREATININE, SERUM
Creatinine, Ser: 1.11 mg/dL — ABNORMAL HIGH (ref 0.50–1.10)
GFR calc Af Amer: 58 mL/min — ABNORMAL LOW (ref >90)

## 2012-11-15 LAB — TROPONIN I
Troponin I: <0.3 ng/mL (ref <0.30)
Troponin I: <0.3 ng/mL (ref <0.30)

## 2012-11-15 LAB — GLUCOSE, CAPILLARY: Glucose-Capillary: 74 mg/dL (ref 70–99)

## 2012-11-15 LAB — BASIC METABOLIC PANEL
CO2: 23 mEq/L (ref 19–32)
Chloride: 107 mEq/L (ref 96–112)
Glucose, Bld: 159 mg/dL — ABNORMAL HIGH (ref 70–99)
Potassium: 4.6 mEq/L (ref 3.5–5.1)
Sodium: 141 mEq/L (ref 135–145)

## 2012-11-15 LAB — CBC
Platelets: 243 10*3/uL (ref 150–400)
RBC: 4.78 MIL/uL (ref 3.87–5.11)
WBC: 11 10*3/uL — ABNORMAL HIGH (ref 4.0–10.5)

## 2012-11-15 LAB — PRO B NATRIURETIC PEPTIDE: Pro B Natriuretic peptide (BNP): 210.2 pg/mL — ABNORMAL HIGH (ref 0–125)

## 2012-11-15 MED ORDER — DOCUSATE SODIUM 100 MG PO CAPS
100.0000 mg | ORAL_CAPSULE | Freq: Two times a day (BID) | ORAL | Status: DC
  Administered 2012-11-15 – 2012-11-18 (×4): 100 mg via ORAL
  Filled 2012-11-15 (×9): qty 1

## 2012-11-15 MED ORDER — POTASSIUM CHLORIDE ER 10 MEQ PO TBCR
10.0000 meq | EXTENDED_RELEASE_TABLET | Freq: Two times a day (BID) | ORAL | Status: DC
  Administered 2012-11-15 – 2012-11-16 (×2): 10 meq via ORAL
  Filled 2012-11-15 (×3): qty 1

## 2012-11-15 MED ORDER — TRIAMCINOLONE ACETONIDE 0.5 % EX CREA
1.0000 "application " | TOPICAL_CREAM | Freq: Every day | CUTANEOUS | Status: DC | PRN
  Filled 2012-11-15: qty 15

## 2012-11-15 MED ORDER — COLCHICINE 0.6 MG PO TABS
0.6000 mg | ORAL_TABLET | Freq: Every day | ORAL | Status: DC
  Administered 2012-11-15 – 2012-11-18 (×4): 0.6 mg via ORAL
  Filled 2012-11-15 (×4): qty 1

## 2012-11-15 MED ORDER — SODIUM CHLORIDE 0.9 % IJ SOLN: 3.0000 mL | INTRAMUSCULAR | Status: DC | PRN

## 2012-11-15 MED ORDER — ONDANSETRON HCL 4 MG/2ML IJ SOLN
4.0000 mg | Freq: Four times a day (QID) | INTRAMUSCULAR | Status: DC | PRN
  Administered 2012-11-17: 4 mg via INTRAVENOUS
  Filled 2012-11-15: qty 2

## 2012-11-15 MED ORDER — INSULIN ASPART 100 UNIT/ML ~~LOC~~ SOLN: 0.0000 [IU] | Freq: Every day | SUBCUTANEOUS | Status: DC

## 2012-11-15 MED ORDER — FLUTICASONE PROPIONATE 50 MCG/ACT NA SUSP
2.0000 | Freq: Every day | NASAL | Status: DC
  Administered 2012-11-16 – 2012-11-19 (×4): 2 via NASAL
  Filled 2012-11-15: qty 16

## 2012-11-15 MED ORDER — FUROSEMIDE 40 MG PO TABS
40.0000 mg | ORAL_TABLET | Freq: Every day | ORAL | Status: DC
  Administered 2012-11-15 – 2012-11-16 (×2): 40 mg via ORAL
  Filled 2012-11-15 (×2): qty 1

## 2012-11-15 MED ORDER — METOPROLOL TARTRATE 50 MG PO TABS
50.0000 mg | ORAL_TABLET | Freq: Two times a day (BID) | ORAL | Status: DC
  Administered 2012-11-15 – 2012-11-16 (×3): 50 mg via ORAL
  Filled 2012-11-15 (×5): qty 1

## 2012-11-15 MED ORDER — SENNA 8.6 MG PO TABS
2.0000 | ORAL_TABLET | Freq: Every day | ORAL | Status: DC | PRN
  Filled 2012-11-15: qty 2

## 2012-11-15 MED ORDER — SODIUM CHLORIDE 0.9 % IJ SOLN
3.0000 mL | Freq: Two times a day (BID) | INTRAMUSCULAR | Status: DC
  Administered 2012-11-15 – 2012-11-17 (×3): 3 mL via INTRAVENOUS

## 2012-11-15 MED ORDER — ASPIRIN EC 81 MG PO TBEC
81.0000 mg | DELAYED_RELEASE_TABLET | Freq: Every day | ORAL | Status: DC
  Administered 2012-11-15 – 2012-11-19 (×5): 81 mg via ORAL
  Filled 2012-11-15 (×5): qty 1

## 2012-11-15 MED ORDER — MORPHINE SULFATE 2 MG/ML IJ SOLN
2.0000 mg | INTRAMUSCULAR | Status: DC | PRN
  Administered 2012-11-15 – 2012-11-16 (×2): 2 mg via INTRAVENOUS
  Filled 2012-11-15 (×2): qty 1

## 2012-11-15 MED ORDER — CILOSTAZOL 50 MG PO TABS
50.0000 mg | ORAL_TABLET | Freq: Two times a day (BID) | ORAL | Status: DC
  Administered 2012-11-16 – 2012-11-19 (×7): 50 mg via ORAL
  Filled 2012-11-15 (×9): qty 1

## 2012-11-15 MED ORDER — MORPHINE SULFATE 2 MG/ML IJ SOLN
2.0000 mg | INTRAMUSCULAR | Status: DC | PRN
  Administered 2012-11-15: 2 mg via INTRAVENOUS

## 2012-11-15 MED ORDER — FOLIC ACID 1 MG PO TABS
1.0000 mg | ORAL_TABLET | Freq: Every day | ORAL | Status: DC
  Administered 2012-11-15 – 2012-11-19 (×5): 1 mg via ORAL
  Filled 2012-11-15 (×5): qty 1

## 2012-11-15 MED ORDER — ASPIRIN 81 MG PO CHEW: 324.0000 mg | CHEWABLE_TABLET | Freq: Once | ORAL | Status: DC

## 2012-11-15 MED ORDER — ERGOCALCIFEROL 1.25 MG (50000 UT) PO CAPS: 50000.0000 [IU] | ORAL_CAPSULE | ORAL | Status: DC

## 2012-11-15 MED ORDER — ASPIRIN 81 MG PO TBEC: 81.0000 mg | DELAYED_RELEASE_TABLET | Freq: Every day | ORAL | Status: DC

## 2012-11-15 MED ORDER — GLIMEPIRIDE 4 MG PO TABS
4.0000 mg | ORAL_TABLET | Freq: Every day | ORAL | Status: DC
  Administered 2012-11-16: 4 mg via ORAL
  Filled 2012-11-15 (×2): qty 1

## 2012-11-15 MED ORDER — INSULIN ASPART 100 UNIT/ML ~~LOC~~ SOLN
0.0000 [IU] | Freq: Three times a day (TID) | SUBCUTANEOUS | Status: DC
  Administered 2012-11-16 – 2012-11-17 (×2): 2 [IU] via SUBCUTANEOUS

## 2012-11-15 MED ORDER — ALBUTEROL SULFATE HFA 108 (90 BASE) MCG/ACT IN AERS: 2.0000 | INHALATION_SPRAY | RESPIRATORY_TRACT | Status: DC | PRN

## 2012-11-15 MED ORDER — HEPARIN SODIUM (PORCINE) 5000 UNIT/ML IJ SOLN
5000.0000 [IU] | Freq: Three times a day (TID) | INTRAMUSCULAR | Status: DC
  Administered 2012-11-15 – 2012-11-19 (×11): 5000 [IU] via SUBCUTANEOUS
  Filled 2012-11-15 (×14): qty 1

## 2012-11-15 MED ORDER — LISINOPRIL 5 MG PO TABS
5.0000 mg | ORAL_TABLET | Freq: Every day | ORAL | Status: DC
  Administered 2012-11-15 – 2012-11-17 (×3): 5 mg via ORAL
  Filled 2012-11-15 (×3): qty 1

## 2012-11-15 MED ORDER — INSULIN GLARGINE 100 UNIT/ML ~~LOC~~ SOLN
8.0000 [IU] | Freq: Every day | SUBCUTANEOUS | Status: DC
  Administered 2012-11-16 – 2012-11-18 (×3): 8 [IU] via SUBCUTANEOUS
  Filled 2012-11-15 (×6): qty 0.08

## 2012-11-15 MED ORDER — HYDROCODONE-ACETAMINOPHEN 5-325 MG PO TABS: 1.0000 | ORAL_TABLET | ORAL | Status: DC | PRN

## 2012-11-15 MED ORDER — LINAGLIPTIN 5 MG PO TABS
5.0000 mg | ORAL_TABLET | Freq: Every day | ORAL | Status: DC
  Administered 2012-11-16: 5 mg via ORAL
  Filled 2012-11-15: qty 1

## 2012-11-15 MED ORDER — ONDANSETRON HCL 4 MG PO TABS: 4.0000 mg | ORAL_TABLET | Freq: Four times a day (QID) | ORAL | Status: DC | PRN

## 2012-11-15 MED ORDER — VITAMIN D (ERGOCALCIFEROL) 1.25 MG (50000 UNIT) PO CAPS
50000.0000 [IU] | ORAL_CAPSULE | ORAL | Status: DC
  Filled 2012-11-15: qty 1

## 2012-11-15 MED ORDER — SODIUM CHLORIDE 0.9 % IV SOLN: 250.0000 mL | INTRAVENOUS | Status: DC | PRN

## 2012-11-15 MED ORDER — MONTELUKAST SODIUM 10 MG PO TABS
10.0000 mg | ORAL_TABLET | Freq: Every day | ORAL | Status: DC
  Administered 2012-11-15 – 2012-11-18 (×4): 10 mg via ORAL
  Filled 2012-11-15 (×5): qty 1

## 2012-11-15 MED ORDER — PANTOPRAZOLE SODIUM 40 MG PO TBEC
40.0000 mg | DELAYED_RELEASE_TABLET | Freq: Every day | ORAL | Status: DC
  Administered 2012-11-15 – 2012-11-16 (×2): 40 mg via ORAL
  Filled 2012-11-15 (×2): qty 1

## 2012-11-15 MED ORDER — SODIUM CHLORIDE 0.9 % IJ SOLN
3.0000 mL | Freq: Two times a day (BID) | INTRAMUSCULAR | Status: DC
  Administered 2012-11-15 – 2012-11-17 (×4): 3 mL via INTRAVENOUS

## 2012-11-15 MED ORDER — TEMAZEPAM 15 MG PO CAPS: 30.0000 mg | ORAL_CAPSULE | Freq: Every evening | ORAL | Status: DC | PRN

## 2012-11-15 MED ORDER — MORPHINE SULFATE 4 MG/ML IJ SOLN
4.0000 mg | INTRAMUSCULAR | Status: DC | PRN
  Administered 2012-11-15: 4 mg via INTRAVENOUS
  Filled 2012-11-15 (×2): qty 1

## 2012-11-15 MED ORDER — NITROGLYCERIN 0.4 MG SL SUBL
0.4000 mg | SUBLINGUAL_TABLET | SUBLINGUAL | Status: DC | PRN
  Administered 2012-11-15: 0.4 mg via SUBLINGUAL

## 2012-11-15 NOTE — ED Notes (Signed)
Patient is resting comfortably. 

## 2012-11-15 NOTE — ED Provider Notes (Signed)
History     CSN: [id number]  Arrival date & time 11/15/12  1231   First MD Initiated Contact with Patient 11/15/12 1506      Chief Complaint  Patient presents with  . Chest Pain     HPI Pt was seen at 1510.   Per pt, c/o gradual onset and persistence of constant left side chest "pain" that began this morning approx 1100.  Pt describes the pain as initially "sharp," which lasted only a few seconds, followed by a continuous "soreness."  States she has had an occasional cough and "wheezing" for the past several days.  Has been wearing her home O2 N/C and using her MDI/nebs as previously prescribed.  Denies palpitations, no abd pain, no N/V/D, no back pain, no fevers, no injury.     Past Medical History  Diagnosis Date  . Alcoholic cirrhosis   . Reflux esophagitis   . Gastroesophageal reflux   . Colon polyps   . CHF (congestive heart failure)   . Hypertension   . History of GI diverticular bleed   . Pneumonia     history of  . Asthma   . Poor circulation   . Anemia   . Blood transfusion   . Sinus problem   . Wears glasses   . Villous adenoma of colon 05/10/11  . PVD (peripheral vascular disease)   . Chronic obstructive pulmonary disease (COPD)   . Cirrhosis   . Anxiety   . Arthritis   . Depression   . Glaucoma   . Gout   . Acute venous embolism and thrombosis of deep vessels of proximal lower extremity   . Bruises easily   . Back pain   . Leg swelling   . Abdominal pain   . Shortness of breath   . Cough   . Acquired cyst of kidney   . Sleep apnea     no study done .  Marland Kitchen Diabetes mellitus     insulin dep   . Hepatitis     Past Surgical History  Procedure Laterality Date  . Abdominal hysterectomy    . Cataract extraction    . Colonoscopy    . Laparoscopic assissted total colectomy w/ j-pouch  04/22/11  . Ventral hernia repair  06/13/2012    Procedure: LAPAROSCOPIC VENTRAL HERNIA;  Surgeon: Ernestene Mention, MD;  Location: [facility] OR;  Service: General;   Laterality: N/A;  Laparoscopic Assisted Ventral Hernia with Mesh    Family History  Problem Relation Age of Onset  . Cancer Brother     heent ca  . Cancer Sister     liver ca    History  Substance Use Topics  . Smoking status: Former Smoker    Quit date: 11/07/2006  . Smokeless tobacco: Not on file  . Alcohol Use: No     Comment: stopped 2000    Review of Systems ROS: Statement: All systems negative except as marked or noted in the HPI; Constitutional: Negative for fever and chills. ; ; Eyes: Negative for eye pain, redness and discharge. ; ; ENMT: Negative for ear pain, hoarseness, nasal congestion, sinus pressure and sore throat. ; ; Cardiovascular: +CP. Negative for palpitations, diaphoresis, dyspnea and peripheral edema. ; ; Respiratory: +cough, wheezing.  Negative for stridor. ; ; Gastrointestinal: Negative for nausea, vomiting, diarrhea, abdominal pain, blood in stool, hematemesis, jaundice and rectal bleeding. . ; ; Genitourinary: Negative for dysuria, flank pain and hematuria. ; ; Musculoskeletal: Negative for back pain and neck  pain. Negative for swelling and trauma.; ; Skin: Negative for pruritus, rash, abrasions, blisters, bruising and skin lesion.; ; Neuro: Negative for headache, lightheadedness and neck stiffness. Negative for weakness, altered level of consciousness , altered mental status, extremity weakness, paresthesias, involuntary movement, seizure and syncope.     Allergies  Penicillins  Home Medications   Current Outpatient Rx  Name  Route  Sig  Dispense  Refill  . albuterol (PROVENTIL HFA;VENTOLIN HFA) 108 (90 BASE) MCG/ACT inhaler   Inhalation   Inhale 2 puffs into the lungs every 6 (six) hours as needed. For shortness of breath         . aspirin 81 MG EC tablet   Oral   Take 81 mg by mouth daily.           . cilostazol (PLETAL) 100 MG tablet   Oral   Take 50 mg by mouth 2 (two) times daily before a meal.          . colchicine (COLCRYS) 0.6 MG  tablet   Oral   Take 0.6 mg by mouth daily.         . diclofenac sodium (VOLTAREN) 1 % GEL   Topical   Apply 1 application topically daily as needed. For leg pain         . diphenhydrAMINE (BENADRYL) 25 mg capsule   Oral   Take 25 mg by mouth at bedtime as needed. For cough         . ergocalciferol (VITAMIN D2) 50000 UNITS capsule   Oral   Take 50,000 Units by mouth once a week. On Monday         . fluticasone (FLONASE) 50 MCG/ACT nasal spray   Nasal   Place 2 sprays into the nose daily. 50 mcg /act 2 sprays nasal once a day          . folic acid (FOLVITE) 1 MG tablet   Oral   Take 1 mg by mouth daily.         . furosemide (LASIX) 40 MG tablet   Oral   Take 1 tablet (40 mg total) by mouth daily.   10 tablet   0   . glimepiride (AMARYL) 4 MG tablet   Oral   Take 4 mg by mouth Daily.          Marland Kitchen glucose blood test strip   Other   1 each by Other route as needed. Use as instructed         . Hydrocodone-Acetaminophen 5-300 MG TABS   Oral   Take 1 tablet by mouth 2 (two) times daily as needed. For pain         . insulin glargine (LANTUS SOLOSTAR) 100 UNIT/ML injection   Subcutaneous   Inject 8 Units into the skin at bedtime.          Marland Kitchen ketorolac (TORADOL) 10 MG tablet   Oral   Take 1 tablet (10 mg total) by mouth every 6 (six) hours as needed for pain.   20 tablet   0   . Lancets (ACCU-CHEK MULTICLIX) lancets      as needed.         . lidocaine (LIDODERM) 5 %   Transdermal   Place 1 patch onto the skin daily. Remove & Discard patch within 12 hours or as directed by MD          . lisinopril (PRINIVIL,ZESTRIL) 5 MG tablet   Oral   Take 5  mg by mouth daily.         . metoprolol (LOPRESSOR) 50 MG tablet   Oral   Take 50 mg by mouth Twice daily.          . montelukast (SINGULAIR) 10 MG tablet   Oral   Take 10 mg by mouth at bedtime.           Marland Kitchen omeprazole (PRILOSEC) 40 MG capsule   Oral   Take 40 mg by mouth 2 (two) times  daily.          . potassium chloride (K-DUR) 10 MEQ tablet   Oral   Take 1 tablet (10 mEq total) by mouth 2 (two) times daily.   10 tablet   0   . sitaGLIPtan (JANUVIA) 50 MG tablet   Oral   Take 50 mg by mouth daily.           . temazepam (RESTORIL) 30 MG capsule   Oral   Take 30 mg by mouth at bedtime as needed. For sleep         . triamcinolone cream (KENALOG) 0.5 %   Topical   Apply 1 application topically daily as needed. For legs           BP 128/83  Pulse 75  Temp(Src) 97.6 F (36.4 C) (Oral)  Resp 19  SpO2 98%  Physical Exam 1515: Physical examination:  Nursing notes reviewed; Vital signs and O2 SAT reviewed;  Constitutional: Well developed, Well nourished, Well hydrated, In no acute distress; Head:  Normocephalic, atraumatic; Eyes: EOMI, PERRL, No scleral icterus; ENMT: Mouth and pharynx normal, Mucous membranes moist; Neck: Supple, Full range of motion, No lymphadenopathy; Cardiovascular: Regular rate and rhythm, No gallop; Respiratory: Breath sounds clear & equal bilaterally, No rales, rhonchi, wheezes.  Speaking full sentences with ease, Normal respiratory effort/excursion; Chest: Nontender, no deformity, Movement normal; Abdomen: Soft, Nontender, Nondistended, Normal bowel sounds;; Extremities: Pulses normal, No tenderness, No edema, No calf edema or asymmetry.; Neuro: AA&Ox3, Major CN grossly intact.  Speech clear. No gross focal motor or sensory deficits in extremities.; Skin: Color normal, Warm, Dry.   ED Course  Procedures     MDM  MDM Reviewed: previous chart, nursing note and vitals Reviewed previous: ECG and labs Interpretation: ECG, labs and x-ray    Date: 11/15/2012  Rate: 80  Rhythm: normal sinus rhythm  QRS Axis: normal  Intervals: normal  ST/T Wave abnormalities: normal  Conduction Disutrbances:none  Narrative Interpretation:   Old EKG Reviewed: unchanged; no significant changes from previous EKG dated 06/18/2012.  Results for  orders placed during the hospital encounter of 11/15/12  CBC WITH DIFFERENTIAL      Result Value Range   WBC 10.0  4.0 - 10.5 K/uL   RBC 4.84  3.87 - 5.11 MIL/uL   Hemoglobin 12.4  12.0 - 15.0 g/dL   HCT 16.1  09.6 - 04.5 %   MCV 79.5  78.0 - 100.0 fL   MCH 25.6 (*) 26.0 - 34.0 pg   MCHC 32.2  30.0 - 36.0 g/dL   RDW 40.9  81.1 - 91.4 %   Platelets 237  150 - 400 K/uL   Neutrophils Relative 62  43 - 77 %   Neutro Abs 6.2  1.7 - 7.7 K/uL   Lymphocytes Relative 30  12 - 46 %   Lymphs Abs 3.0  0.7 - 4.0 K/uL   Monocytes Relative 7  3 - 12 %   Monocytes Absolute 0.7  0.1 - 1.0 K/uL   Eosinophils Relative 1  0 - 5 %   Eosinophils Absolute 0.1  0.0 - 0.7 K/uL   Basophils Relative 0  0 - 1 %   Basophils Absolute 0.0  0.0 - 0.1 K/uL  BASIC METABOLIC PANEL      Result Value Range   Sodium 141  135 - 145 mEq/L   Potassium 4.6  3.5 - 5.1 mEq/L   Chloride 107  96 - 112 mEq/L   CO2 23  19 - 32 mEq/L   Glucose, Bld 159 (*) 70 - 99 mg/dL   BUN 15  6 - 23 mg/dL   Creatinine, Ser 1.61 (*) 0.50 - 1.10 mg/dL   Calcium 9.5  8.4 - 09.6 mg/dL   GFR calc non Af Amer 48 (*) >90 mL/min   GFR calc Af Amer 55 (*) >90 mL/min  PRO B NATRIURETIC PEPTIDE      Result Value Range   Pro B Natriuretic peptide (BNP) 210.2 (*) 0 - 125 pg/mL  TROPONIN I      Result Value Range   Troponin I <0.30  <0.30 ng/mL   Dg Chest 2 View 11/15/2012  *RADIOLOGY REPORT*  Clinical Data: Chest pain.  CHEST - 2 VIEW  Comparison: 06/19/2012.  Findings: The cardiac silhouette, mediastinal and hilar contours are within normal limits and stable.  There are chronic basilar scarring changes and mild pleural thickening.  No definite acute overlying pulmonary process.  No pulmonary edema.  Rounded density in the left upper lobe is artifact from an EKG lead.  The bony structures are intact.  There are numerous thoracic compression fractures which appear stable.  IMPRESSION: Chronic basilar scarring changes.  No acute overlying pulmonary  process.   Original Report Authenticated By: Rudie Meyer, M.D.     Pauli.Sours:  Pt somewhat improved after ASA and ntg.  Will dose IV morphine.  VS remain stable. Doubt COPD exacerbation without wheezing/SOB/decreased O2 Sats. Multiple ACS risk factors, will admit for observation.  T/C to [facility] Dr. Conley Rolls, case discussed, including:  HPI, pertinent PM/SHx, VS/PE, dx testing, ED course and treatment:  Agreeable to observation admit, requests to add V/Q scan to workup, he will come to ED for eval.            Laray Anger, DO 11/18/12 0034

## 2012-11-15 NOTE — ED Notes (Signed)
Admitting Dr. Le at bedside 

## 2012-11-15 NOTE — ED Notes (Signed)
Patient requests something for pain that is just under her left breast area in the chest.

## 2012-11-15 NOTE — Progress Notes (Signed)
[facility] progress note. Chief complaint. Chest pain. History of present illness. This 67 year old female admitted earlier today with complaints of chest pain. Patient has a prior history of CHF, COPD, stage III kidney disease, asthma. She was admitted and to rule out cardiac ischemia. Her EKG in the emergency room was not suggestive of acute ischemia. She's been having troponin trending and the first one has resulted less than 0.3. Patient again complaining to the nursing staff of chest pain over the left frontal chest. An EKG was obtained and this again looks like normal sinus rhythm without evidence of acute ischemia. I did come to see the patient at the bedside. She tells me that she received 3 nitroglycerin earlier in the admission process and this was not helpful. The only thing of will to her pain to this point had been morphine. She states the pain is worse with deep inspiration. She states it is a stabbing pain and it comes and goes. There is no radiation to the pain except under her left breast. She denies associated nausea or diaphoresis. Vital signs. Temperature 97.7, pulse 65, respiration 18, blood pressure 159/101. General appearance. Well-developed elderly female who is alert, cooperative, and though in pain no noted distress. Cardiac. Rate and rhythm regular. No jugular venous distention. No peripheral edema. There is no calf pain and Homans negative. Significant pain with palpation over the sternal area. Patient states this duplicates her current symptoms of pain. Lungs. Breath sounds are clear and equal. Abdomen. Soft and protuberant with positive bowel sounds. There is some pain with palpation over the left upper quadrant. No guarding or rebound tenderness. Impression/plan. Problem #1. Atypical chest pain. Patient's EKG is not suggestive of ischemia. Her first troponin was unremarkable. Pain is reproducible with palpation over the sternum. Suspect this is some type of  musculoskeletal strain. I left the patient an order for morphine sulfate 2-4 mg every 2 hours as needed. Patient states she has been constipated and have left her an order for when necessary Senokot which she states she uses at home. We'll follow for any upper trending of her troponins. Problem #1. Atypical chest pain.

## 2012-11-15 NOTE — ED Notes (Signed)
Per EMS- pt had a sudden onset of sharp left chest pain this morning while she was eating breakfast. Pt has a dry cough that started last night. States that she has had 2 episodes of the sharp pain that radiated into left arm. States that at this time she only feels "sore". Pt received 1 nitro and 324mg  of ASA en route. With no relief. Pt has hx of CHF and has SOB at baseline. Pt is on as needed O2 at home and is on 2L now with O2 sats at 96%. BP150/71. Hr 84.

## 2012-11-15 NOTE — H&P (Signed)
[facility] History and Physical  [name] WUJ:[id number] DOB: Feb 29, 1946    PCP:   Dorrene German, MD   Chief Complaint: Chest pain  HPI: [name] is an 67 y.o. female with history of congestive heart failure, GERD, prior alcohol use with alcoholic cirrhosis, hypertension, congestive heart failure, COPD and asthma, anxiety, insulin-dependent diabetes, this morning with left-sided chest pain, sharp, nonradiating, slightly pleuritic and slightly palpable, without accompanying shortness of breath, nausea, vomiting or diaphoresis. She denied like swelling or calf tenderness, distant travel or recent surgery. Evaluation in emergency room included an EKG which showed no acute ischemic changes, negative initial troponin, clear chest x-ray. Her creatinine is 1.15. She has no leukocytosis, fever, chills, or productive cough. Hospitalist was asked to admit her for cardiac rule out. She has no symptom suggestive of been in acute congestive heart failure.  Rewiew of Systems:  Constitutional: Negative for malaise, fever and chills. No significant weight loss or weight gain Eyes: Negative for eye pain, redness and discharge, diplopia, visual changes, or flashes of light. ENMT: Negative for ear pain, hoarseness, nasal congestion, sinus pressure and sore throat. No headaches; tinnitus, drooling, or problem swallowing. Cardiovascular: Negative for palpitations, diaphoresis, dyspnea and peripheral edema. ; No orthopnea, PND Respiratory: Negative for cough, hemoptysis, wheezing and stridor.  Gastrointestinal: Negative for nausea, vomiting, diarrhea, constipation, abdominal pain, melena, blood in stool, hematemesis, jaundice and rectal bleeding.    Genitourinary: Negative for frequency, dysuria, incontinence,flank pain and hematuria; Musculoskeletal: Negative for back pain and neck pain. Negative for swelling and trauma.;  Skin: . Negative for pruritus, rash, abrasions, bruising and skin  lesion.; ulcerations Neuro: Negative for headache, lightheadedness and neck stiffness. Negative for weakness, altered level of consciousness , altered mental status, extremity weakness, burning feet, involuntary movement, seizure and syncope.  Psych: negative for  depression, insomnia, tearfulness, panic attacks, hallucinations, paranoia, suicidal or homicidal ideation    Past Medical History  Diagnosis Date  . Alcoholic cirrhosis   . Reflux esophagitis   . Gastroesophageal reflux   . Colon polyps   . CHF (congestive heart failure)   . Hypertension   . History of GI diverticular bleed   . Pneumonia     history of  . Asthma   . Poor circulation   . Anemia   . Blood transfusion   . Sinus problem   . Wears glasses   . Villous adenoma of colon 05/10/11  . PVD (peripheral vascular disease)   . Chronic obstructive pulmonary disease (COPD)   . Cirrhosis   . Anxiety   . Arthritis   . Depression   . Glaucoma   . Gout   . Acute venous embolism and thrombosis of deep vessels of proximal lower extremity   . Bruises easily   . Back pain   . Leg swelling   . Abdominal pain   . Shortness of breath   . Cough   . Acquired cyst of kidney   . Sleep apnea     no study done .  Marland Kitchen Diabetes mellitus     insulin dep   . Hepatitis     Past Surgical History  Procedure Laterality Date  . Abdominal hysterectomy    . Cataract extraction    . Colonoscopy    . Laparoscopic assissted total colectomy w/ j-pouch  04/22/11  . Ventral hernia repair  06/13/2012    Procedure: LAPAROSCOPIC VENTRAL HERNIA;  Surgeon: Ernestene Mention, MD;  Location: [facility] OR;  Service: General;  Laterality: N/A;  Laparoscopic Assisted Ventral Hernia with Mesh    Medications:  HOME MEDS: Prior to Admission medications   Medication Sig Start Date End Date Taking? Authorizing Provider  albuterol (PROVENTIL HFA;VENTOLIN HFA) 108 (90 BASE) MCG/ACT inhaler Inhale 2 puffs into the lungs every 6 (six) hours as needed. For  shortness of breath   Yes Historical Provider, MD  aspirin 81 MG EC tablet Take 81 mg by mouth daily.     Yes Historical Provider, MD  cilostazol (PLETAL) 100 MG tablet Take 50 mg by mouth 2 (two) times daily before a meal.    Yes Historical Provider, MD  colchicine (COLCRYS) 0.6 MG tablet Take 0.6 mg by mouth daily.   Yes Historical Provider, MD  diclofenac sodium (VOLTAREN) 1 % GEL Apply 1 application topically daily as needed. For leg pain   Yes Historical Provider, MD  diphenhydrAMINE (BENADRYL) 25 mg capsule Take 25 mg by mouth at bedtime as needed. For cough   Yes Historical Provider, MD  ergocalciferol (VITAMIN D2) 50000 UNITS capsule Take 50,000 Units by mouth once a week. On Monday   Yes Historical Provider, MD  fluticasone (FLONASE) 50 MCG/ACT nasal spray Place 2 sprays into the nose daily. 50 mcg /act 2 sprays nasal once a day    Yes Historical Provider, MD  folic acid (FOLVITE) 1 MG tablet Take 1 mg by mouth daily.   Yes Historical Provider, MD  furosemide (LASIX) 40 MG tablet Take 1 tablet (40 mg total) by mouth daily. 06/21/12  Yes Leroy Sea, MD  glimepiride (AMARYL) 4 MG tablet Take 4 mg by mouth Daily.  06/04/12  Yes Historical Provider, MD  glucose blood test strip 1 each by Other route as needed. Use as instructed   Yes Historical Provider, MD  Hydrocodone-Acetaminophen 5-300 MG TABS Take 1 tablet by mouth 2 (two) times daily as needed. For pain   Yes Historical Provider, MD  insulin glargine (LANTUS SOLOSTAR) 100 UNIT/ML injection Inject 8 Units into the skin at bedtime.    Yes Historical Provider, MD  ketorolac (TORADOL) 10 MG tablet Take 1 tablet (10 mg total) by mouth every 6 (six) hours as needed for pain. 06/21/12  Yes Ernestene Mention, MD  Lancets (ACCU-CHEK MULTICLIX) lancets as needed. 08/16/12  Yes Historical Provider, MD  lidocaine (LIDODERM) 5 % Place 1 patch onto the skin daily. Remove & Discard patch within 12 hours or as directed by MD    Yes Historical  Provider, MD  lisinopril (PRINIVIL,ZESTRIL) 5 MG tablet Take 5 mg by mouth daily.   Yes Historical Provider, MD  metoprolol (LOPRESSOR) 50 MG tablet Take 50 mg by mouth Twice daily.  06/04/12  Yes Historical Provider, MD  montelukast (SINGULAIR) 10 MG tablet Take 10 mg by mouth at bedtime.     Yes Historical Provider, MD  omeprazole (PRILOSEC) 40 MG capsule Take 40 mg by mouth 2 (two) times daily.    Yes Historical Provider, MD  potassium chloride (K-DUR) 10 MEQ tablet Take 1 tablet (10 mEq total) by mouth 2 (two) times daily. 06/21/12  Yes Leroy Sea, MD  sitaGLIPtan (JANUVIA) 50 MG tablet Take 50 mg by mouth daily.     Yes Historical Provider, MD  temazepam (RESTORIL) 30 MG capsule Take 30 mg by mouth at bedtime as needed. For sleep   Yes Historical Provider, MD  triamcinolone cream (KENALOG) 0.5 % Apply 1 application topically daily as needed. For legs   Yes Historical Provider, MD  Allergies:  Allergies  Allergen Reactions  . Penicillins Hives    Social History:   reports that she quit smoking about 6 years ago. She does not have any smokeless tobacco history on file. She reports that she does not drink alcohol or use illicit drugs.  Family History: Family History  Problem Relation Age of Onset  . Cancer Brother     heent ca  . Cancer Sister     liver ca     Physical Exam: Filed Vitals:   11/15/12 1730 11/15/12 1800 11/15/12 1830 11/15/12 1900  BP: 147/85 171/76 130/82 139/73  Pulse: 76 80 82 70  Temp:      TempSrc:      Resp: 21 30 18 17   SpO2: 96% 92% 95% 96%   Blood pressure 139/73, pulse 70, temperature 97.6 F (36.4 C), temperature source Oral, resp. rate 17, SpO2 96.00%.  GEN:  Pleasant patient lying in the stretcher in no acute distress; cooperative with exam. PSYCH:  alert and oriented x4; does not appear anxious or depressed; affect is appropriate. HEENT: Mucous membranes pink and anicteric; PERRLA; EOM intact; no cervical lymphadenopathy nor  thyromegaly or carotid bruit; no JVD; There were no stridor. Neck is very supple. Breasts:: Not examined CHEST WALL: No tenderness CHEST: Normal respiration, clear to auscultation bilaterally.  HEART: Regular rate and rhythm.  There are no murmur, rub, or gallops.   BACK: No kyphosis or scoliosis; no CVA tenderness ABDOMEN: soft and non-tender; no masses, no organomegaly, normal abdominal bowel sounds; no pannus; no intertriginous candida. There is no rebound and no distention. Rectal Exam: Not done EXTREMITIES: No bone or joint deformity; age-appropriate arthropathy of the hands and knees; no edema; no ulcerations.  There is no calf tenderness. Genitalia: not examined PULSES: 2+ and symmetric SKIN: Normal hydration no rash or ulceration CNS: Cranial nerves 2-12 grossly intact no focal lateralizing neurologic deficit.  Speech is fluent; uvula elevated with phonation, facial symmetry and tongue midline. DTR are normal bilaterally, cerebella exam is intact, barbinski is negative and strengths are equaled bilaterally.  No sensory loss.   Labs on Admission:  Basic Metabolic Panel:  Recent Labs Lab 11/15/12 1430  NA 141  K 4.6  CL 107  CO2 23  GLUCOSE 159*  BUN 15  CREATININE 1.15*  CALCIUM 9.5   Liver Function Tests: No results found for this basename: AST, ALT, ALKPHOS, BILITOT, PROT, ALBUMIN,  in the last 168 hours No results found for this basename: LIPASE, AMYLASE,  in the last 168 hours No results found for this basename: AMMONIA,  in the last 168 hours CBC:  Recent Labs Lab 11/15/12 1430  WBC 10.0  NEUTROABS 6.2  HGB 12.4  HCT 38.5  MCV 79.5  PLT 237   Cardiac Enzymes:  Recent Labs Lab 11/15/12 1710  TROPONINI <0.30    CBG: No results found for this basename: GLUCAP,  in the last 168 hours   Radiological Exams on Admission: Dg Chest 2 View  11/15/2012  *RADIOLOGY REPORT*  Clinical Data: Chest pain.  CHEST - 2 VIEW  Comparison: 06/19/2012.  Findings: The  cardiac silhouette, mediastinal and hilar contours are within normal limits and stable.  There are chronic basilar scarring changes and mild pleural thickening.  No definite acute overlying pulmonary process.  No pulmonary edema.  Rounded density in the left upper lobe is artifact from an EKG lead.  The bony structures are intact.  There are numerous thoracic compression fractures which appear stable.  IMPRESSION: Chronic basilar scarring changes.  No acute overlying pulmonary process.   Original Report Authenticated By: Rudie Meyer, M.D.     EKG: Independently reviewed. NSR with no acute ST-T changes.   Assessment/Plan Present on Admission:  . Atypical chest pain . Diastolic CHF, chronic . COPD (chronic obstructive pulmonary disease) . CKD (chronic kidney disease), stage III . Asthma  PLAN:  Will admit for chest pain r/out.   Her symptoms are at best atypical.  Will get serial troponins and continue her meds including ASA, statin, betablocker.  I don't think this is a PE, but its important to exclude this as well.  Since she has CKD III, I will obtain a V/Q scan in the morning, but will hold off on full anticoagulation unless it is positive.  I have continued her home meds.  She is stable, full code, and will be admitted to [facility] service. Thank you for allowing me to participate in the care of your nice patient.  Other plans as per orders.  Code Status: FULL Unk Lightning, MD. [facility] Pager [phone] 7pm to 7am.  11/15/2012, 8:52 PM

## 2012-11-16 ENCOUNTER — Observation Stay (HOSPITAL_COMMUNITY): Payer: PRIVATE HEALTH INSURANCE

## 2012-11-16 LAB — GLUCOSE, CAPILLARY
Glucose-Capillary: 115 mg/dL — ABNORMAL HIGH (ref 70–99)
Glucose-Capillary: 145 mg/dL — ABNORMAL HIGH (ref 70–99)
Glucose-Capillary: 83 mg/dL (ref 70–99)

## 2012-11-16 MED ORDER — TECHNETIUM TO 99M ALBUMIN AGGREGATED
6.0000 | Freq: Once | INTRAVENOUS | Status: AC | PRN
  Administered 2012-11-16: 6 via INTRAVENOUS

## 2012-11-16 MED ORDER — GI COCKTAIL ~~LOC~~
30.0000 mL | Freq: Once | ORAL | Status: AC
  Administered 2012-11-16: 30 mL via ORAL
  Filled 2012-11-16: qty 30

## 2012-11-16 MED ORDER — IBUPROFEN 400 MG PO TABS
400.0000 mg | ORAL_TABLET | Freq: Three times a day (TID) | ORAL | Status: DC | PRN
  Filled 2012-11-16: qty 1

## 2012-11-16 MED ORDER — IBUPROFEN 400 MG PO TABS
400.0000 mg | ORAL_TABLET | Freq: Four times a day (QID) | ORAL | Status: DC
  Administered 2012-11-16 (×2): 400 mg via ORAL
  Filled 2012-11-16 (×4): qty 1

## 2012-11-16 MED ORDER — TECHNETIUM TC 99M DIETHYLENETRIAME-PENTAACETIC ACID: 40.0000 | Freq: Once | INTRAVENOUS | Status: AC | PRN

## 2012-11-16 NOTE — Progress Notes (Signed)
UR Completed Mathias Bogacki Graves-Bigelow, RN,BSN [phone]  

## 2012-11-16 NOTE — Progress Notes (Signed)
Inpatient Diabetes Program Recommendations  AACE/ADA: New Consensus Statement on Inpatient Glycemic Control (2013)  Target Ranges:  Prepandial:   less than 140 mg/dL      Peak postprandial:   less than 180 mg/dL (1-2 hours)      Critically ill patients:  140 - 180 mg/dL   Inpatient Diabetes Program Recommendations Correction (SSI): Decrease to SENSITIVE scale TID Thank you  Piedad Climes BSN, RN,CDE Inpatient Diabetes Coordinator [phone] (team pager)

## 2012-11-16 NOTE — Progress Notes (Signed)
[facility] PROGRESS NOTE  [name] WUJ:[id number] DOB: [birth year] DOA: 11/15/2012 PCP: Dorrene German, MD  Assessment/Plan: 1. Non cardiac chest pain/ Atypical chest pain possibly costochondritis given she has tenderness to palpation on the left sided chest wall.  - ACS ruled out with negative enzymes and normal EKG. Ordered ibuprofen for pain relief and morphine on board. Will get lipid panel in am. If chest pain re occurs , will call cardiology, [facility]..  2. Mild renal insufficiency: gentle hydration.  3. DM:  CBG (last 3)   Recent Labs  11/16/12 0733 11/16/12 1126 11/16/12 1652  GLUCAP 115* 145* 83    Resume SSI.   4. dvt prophylaxis.   Code Status: FULL CODE Family Communication: NONE AT BEDSIDE Disposition Plan: pending.    Consultants:  [facility]  HPI/Subjective: Reported by the RN, pt had one episode of vomiting .   Objective: Filed Vitals:   11/16/12 1002 11/16/12 1400 11/16/12 1657 11/16/12 1704  BP: 105/65 108/69 91/54   Pulse: 65 72 68   Temp:  97.9 F (36.6 C) 97.4 F (36.3 C)   TempSrc:  Oral Oral   Resp:  18 19   Height:      Weight:      SpO2:  96% 84% 94%    Intake/Output Summary (Last 24 hours) at 11/16/12 1847 Last data filed at 11/16/12 1400  Gross per 24 hour  Intake    360 ml  Output    750 ml  Net   -390 ml   Filed Weights   11/15/12 2144 11/16/12 0439  Weight: 69.899 kg (154 lb 1.6 oz) 69.945 kg (154 lb 3.2 oz)    Exam:   General:  Alert afebrile comfortable  Cardiovascular: s1s2 RRR,   Respiratory: CT AB, chest wall tenderness present on the left side  Abdomen: soft NT ND BS+  Musculoskeletal: TENDERNESS of the left sided chest wall.   Data Reviewed: Basic Metabolic Panel:  Recent Labs Lab 11/15/12 1430 11/15/12 2222  NA 141  --   K 4.6  --   CL 107  --   CO2 23  --   GLUCOSE 159*  --   BUN 15  --   CREATININE 1.15* 1.11*  CALCIUM 9.5  --    Liver Function Tests: No results found for this  basename: AST, ALT, ALKPHOS, BILITOT, PROT, ALBUMIN,  in the last 168 hours No results found for this basename: LIPASE, AMYLASE,  in the last 168 hours No results found for this basename: AMMONIA,  in the last 168 hours CBC:  Recent Labs Lab 11/15/12 1430 11/15/12 2222  WBC 10.0 11.0*  NEUTROABS 6.2  --   HGB 12.4 12.2  HCT 38.5 38.0  MCV 79.5 79.5  PLT 237 243   Cardiac Enzymes:  Recent Labs Lab 11/15/12 1710 11/15/12 2218 11/16/12 0435 11/16/12 1015  TROPONINI <0.30 <0.30 <0.30 <0.30   BNP (last 3 results)  Recent Labs  06/18/12 2107 11/15/12 1710  PROBNP 847.9* 210.2*   CBG:  Recent Labs Lab 11/15/12 2141 11/15/12 2222 11/16/12 0733 11/16/12 1126 11/16/12 1652  GLUCAP 53* 74 115* 145* 83    No results found for this or any previous visit (from the past 240 hour(s)).   Studies: Dg Chest 2 View  11/15/2012  *RADIOLOGY REPORT*  Clinical Data: Chest pain.  CHEST - 2 VIEW  Comparison: 06/19/2012.  Findings: The cardiac silhouette, mediastinal and hilar contours are within normal limits and stable.  There are chronic  basilar scarring changes and mild pleural thickening.  No definite acute overlying pulmonary process.  No pulmonary edema.  Rounded density in the left upper lobe is artifact from an EKG lead.  The bony structures are intact.  There are numerous thoracic compression fractures which appear stable.  IMPRESSION: Chronic basilar scarring changes.  No acute overlying pulmonary process.   Original Report Authenticated By: Rudie Meyer, M.D.    Nm Pulmonary Perf And Vent  11/16/2012  *RADIOLOGY REPORT*  Clinical Data: Shortness of breath and chest pain.  NM PULMONARY VENTILATION AND PERFUSION SCAN  Radiopharmaceutical: CURIE MAA TECHNETIUM TO 7M ALBUMIN AGGREGATED 40 mCi aerosolized technetium DTPA  Comparison: 06/19/2012.  Findings: The ventilation scan is limited by a significant clumping of the radiopharmaceutical.  No large ventilation defects are  identified.  The perfusion lung scan demonstrates a few patchy subsegmental perfusion defects but no larger well-defined segmental or subsegmental perfusion abnormalities.  IMPRESSION: Very low probability ventilation perfusion lung scan for pulmonary embolism.   Original Report Authenticated By: Rudie Meyer, M.D.     Scheduled Meds: . aspirin EC  81 mg Oral Daily  . cilostazol  50 mg Oral BID AC  . colchicine  0.6 mg Oral Daily  . docusate sodium  100 mg Oral BID  . fluticasone  2 spray Each Nare Daily  . folic acid  1 mg Oral Daily  . furosemide  40 mg Oral Daily  . gi cocktail  30 mL Oral Once  . glimepiride  4 mg Oral Q breakfast  . heparin  5,000 Units Subcutaneous Q8H  . ibuprofen  400 mg Oral QID  . insulin aspart  0-15 Units Subcutaneous TID WC  . insulin aspart  0-5 Units Subcutaneous QHS  . insulin glargine  8 Units Subcutaneous QHS  . linagliptin  5 mg Oral Daily  . lisinopril  5 mg Oral Daily  . metoprolol  50 mg Oral BID  . montelukast  10 mg Oral QHS  . pantoprazole  40 mg Oral Daily  . potassium chloride  10 mEq Oral BID  . sodium chloride  3 mL Intravenous Q12H  . sodium chloride  3 mL Intravenous Q12H  . [START ON 11/18/2012] Vitamin D (Ergocalciferol)  50,000 Units Oral Q7 days   Continuous Infusions:   Principal Problem:   Atypical chest pain Active Problems:   Asthma   COPD (chronic obstructive pulmonary disease)   CKD (chronic kidney disease), stage III   Diastolic CHF, chronic        [name]  [facility] Pager [phone]. If 7PM-7AM, please contact night-coverage at [email or web address], password [facility] 11/16/2012, 6:47 PM  LOS: 1 day

## 2012-11-16 NOTE — Progress Notes (Addendum)
Pt c/o left sided chest pain extending from her sternum, sharp in nature,  rating it a 10/10 around 10 pm. Pt appeared to be in great distress shedding tears, and gripping the left side of her chest. Vital signs @ 2144, afebrile, P 100,  R 18,  BP 170/80, 97 % on 2L N/C, EKG showed ST. Enzymes negative. Dr. On call notified and made aware. A total of 4mg  iv Morphine administered which eventually relieved chest pain. Rapid Response called and came to see pt. BP decreased to 159/101 at 2230,  then 124/75 at 2347. Pt began to stabilize and rest comfortably with chest pain dissipating.  Will continue to monitor patient.
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
## 2012-11-17 ENCOUNTER — Observation Stay (HOSPITAL_COMMUNITY): Payer: PRIVATE HEALTH INSURANCE

## 2012-11-17 ENCOUNTER — Other Ambulatory Visit: Payer: Self-pay

## 2012-11-17 DIAGNOSIS — C189 Malignant neoplasm of colon, unspecified: Secondary | ICD-10-CM | POA: Diagnosis present

## 2012-11-17 DIAGNOSIS — I739 Peripheral vascular disease, unspecified: Secondary | ICD-10-CM | POA: Diagnosis present

## 2012-11-17 DIAGNOSIS — M1990 Unspecified osteoarthritis, unspecified site: Secondary | ICD-10-CM | POA: Diagnosis present

## 2012-11-17 DIAGNOSIS — D649 Anemia, unspecified: Secondary | ICD-10-CM

## 2012-11-17 DIAGNOSIS — K7460 Unspecified cirrhosis of liver: Secondary | ICD-10-CM | POA: Diagnosis present

## 2012-11-17 DIAGNOSIS — K279 Peptic ulcer, site unspecified, unspecified as acute or chronic, without hemorrhage or perforation: Secondary | ICD-10-CM | POA: Diagnosis present

## 2012-11-17 LAB — GLUCOSE, CAPILLARY: Glucose-Capillary: 203 mg/dL — ABNORMAL HIGH (ref 70–99)

## 2012-11-17 LAB — COMPREHENSIVE METABOLIC PANEL
ALT: 15 U/L (ref 0–35)
AST: 26 U/L (ref 0–37)
Albumin: 2.9 g/dL — ABNORMAL LOW (ref 3.5–5.2)
CO2: 23 mEq/L (ref 19–32)
Chloride: 104 mEq/L (ref 96–112)
Creatinine, Ser: 1.8 mg/dL — ABNORMAL HIGH (ref 0.50–1.10)
GFR calc non Af Amer: 28 mL/min — ABNORMAL LOW (ref >90)
Sodium: 139 mEq/L (ref 135–145)
Total Bilirubin: 0.2 mg/dL — ABNORMAL LOW (ref 0.3–1.2)

## 2012-11-17 LAB — CBC
MCV: 80.4 fL (ref 78.0–100.0)
Platelets: 221 10*3/uL (ref 150–400)
RBC: 4.49 MIL/uL (ref 3.87–5.11)
RDW: 15.4 % (ref 11.5–15.5)
WBC: 11.7 10*3/uL — ABNORMAL HIGH (ref 4.0–10.5)

## 2012-11-17 MED ORDER — KETOROLAC TROMETHAMINE 30 MG/ML IJ SOLN
30.0000 mg | Freq: Four times a day (QID) | INTRAMUSCULAR | Status: DC | PRN
  Administered 2012-11-17: 30 mg via INTRAVENOUS
  Filled 2012-11-17: qty 1

## 2012-11-17 MED ORDER — PANTOPRAZOLE SODIUM 40 MG PO TBEC
40.0000 mg | DELAYED_RELEASE_TABLET | Freq: Every day | ORAL | Status: DC
  Administered 2012-11-17 – 2012-11-19 (×3): 40 mg via ORAL
  Filled 2012-11-17 (×3): qty 1

## 2012-11-17 MED ORDER — DIPHENHYDRAMINE HCL 25 MG PO CAPS
25.0000 mg | ORAL_CAPSULE | Freq: Once | ORAL | Status: AC
  Administered 2012-11-17: 25 mg via ORAL
  Filled 2012-11-17: qty 1

## 2012-11-17 MED ORDER — SODIUM CHLORIDE 0.9 % IV SOLN
INTRAVENOUS | Status: DC
  Administered 2012-11-17: 18:00:00 via INTRAVENOUS

## 2012-11-17 MED ORDER — GI COCKTAIL ~~LOC~~
30.0000 mL | Freq: Once | ORAL | Status: AC
  Administered 2012-11-17: 30 mL via ORAL
  Filled 2012-11-17: qty 30

## 2012-11-17 NOTE — Progress Notes (Addendum)
[facility] PROGRESS NOTE  [name] NGE:[id number] DOB: Jun 05, 1946 DOA: 11/15/2012 PCP: Dorrene German, MD  Assessment/Plan: 1. Non cardiac chest pain/ Atypical chest pain possibly costochondritis given she has tenderness to palpation on the left sided chest wall. Started her on ibuprofen, but she appears to have epigastric pain when we started her on ibuprofen. So I made it prn, started her on protonix and GI cocktail. Despite both GI cocktail and protonix she has persistent chest pain and she requested Dr berry. Spoke to Dr Tresa Endo requesting  for an inpatient consultation.  - ACS ruled out with negative enzymes and normal EKG. V/q scan obtained showed very low probability for PE.   2. Mild renal insufficiency: gentle hydration.  3. DM:  CBG (last 3)   Recent Labs  11/16/12 2141 11/17/12 0711 11/17/12 1127  GLUCAP 182* 96 203*    Resume SSI and lantus.   4. dvt prophylaxis.   5. PVD: resume aspirin and cilostazol.   6. Hypertension: BP parameters running a little low. Held the lisinopril.   7. An episode of vomiting this afternoon: will get cmp and lipase, and get abd film to evaluate for obstruction.   Code Status: FULL CODE Family Communication: NONE AT BEDSIDE Disposition Plan: pending.    Consultants:  [facility]  HPI/Subjective: Reported by the RN, that protonix and gi cocktail did not relieve her chest pain.   Objective: Filed Vitals:   11/16/12 2033 11/16/12 2157 11/17/12 0544 11/17/12 1016  BP: 111/79  104/64 99/52  Pulse: 73 80 66   Temp: 97.8 F (36.6 C)  98.7 F (37.1 C)   TempSrc: Oral  Oral   Resp: 18  18   Height:      Weight:   69.673 kg (153 lb 9.6 oz)   SpO2: 94%  96%     Intake/Output Summary (Last 24 hours) at 11/17/12 1227 Last data filed at 11/16/12 1400  Gross per 24 hour  Intake      0 ml  Output    350 ml  Net   -350 ml   Filed Weights   11/15/12 2144 11/16/12 0439 11/17/12 0544  Weight: 69.899 kg (154 lb 1.6 oz)  69.945 kg (154 lb 3.2 oz) 69.673 kg (153 lb 9.6 oz)    Exam:   General:  Alert afebrile in mild distress from vomiting.   Cardiovascular: s1s2 RRR,   Respiratory: CT AB, chest wall tenderness present on the left side  Abdomen: soft NT ND BS+  Musculoskeletal: TENDERNESS of the left sided chest wall. No pedal edema.   Data Reviewed: Basic Metabolic Panel:  Recent Labs Lab 11/15/12 1430 11/15/12 2222  NA 141  --   K 4.6  --   CL 107  --   CO2 23  --   GLUCOSE 159*  --   BUN 15  --   CREATININE 1.15* 1.11*  CALCIUM 9.5  --    Liver Function Tests: No results found for this basename: AST, ALT, ALKPHOS, BILITOT, PROT, ALBUMIN,  in the last 168 hours No results found for this basename: LIPASE, AMYLASE,  in the last 168 hours No results found for this basename: AMMONIA,  in the last 168 hours CBC:  Recent Labs Lab 11/15/12 1430 11/15/12 2222  WBC 10.0 11.0*  NEUTROABS 6.2  --   HGB 12.4 12.2  HCT 38.5 38.0  MCV 79.5 79.5  PLT 237 243   Cardiac Enzymes:  Recent Labs Lab 11/15/12 1710 11/15/12 2218 11/16/12  0435 11/16/12 1015  TROPONINI <0.30 <0.30 <0.30 <0.30   BNP (last 3 results)  Recent Labs  06/18/12 2107 11/15/12 1710  PROBNP 847.9* 210.2*   CBG:  Recent Labs Lab 11/16/12 1126 11/16/12 1652 11/16/12 2141 11/17/12 0711 11/17/12 1127  GLUCAP 145* 83 182* 96 203*    No results found for this or any previous visit (from the past 240 hour(s)).   Studies: Dg Chest 2 View  11/15/2012  *RADIOLOGY REPORT*  Clinical Data: Chest pain.  CHEST - 2 VIEW  Comparison: 06/19/2012.  Findings: The cardiac silhouette, mediastinal and hilar contours are within normal limits and stable.  There are chronic basilar scarring changes and mild pleural thickening.  No definite acute overlying pulmonary process.  No pulmonary edema.  Rounded density in the left upper lobe is artifact from an EKG lead.  The bony structures are intact.  There are numerous thoracic  compression fractures which appear stable.  IMPRESSION: Chronic basilar scarring changes.  No acute overlying pulmonary process.   Original Report Authenticated By: Rudie Meyer, M.D.    Nm Pulmonary Perf And Vent  11/16/2012  *RADIOLOGY REPORT*  Clinical Data: Shortness of breath and chest pain.  NM PULMONARY VENTILATION AND PERFUSION SCAN  Radiopharmaceutical: CURIE MAA TECHNETIUM TO 75M ALBUMIN AGGREGATED 40 mCi aerosolized technetium DTPA  Comparison: 06/19/2012.  Findings: The ventilation scan is limited by a significant clumping of the radiopharmaceutical.  No large ventilation defects are identified.  The perfusion lung scan demonstrates a few patchy subsegmental perfusion defects but no larger well-defined segmental or subsegmental perfusion abnormalities.  IMPRESSION: Very low probability ventilation perfusion lung scan for pulmonary embolism.   Original Report Authenticated By: Rudie Meyer, M.D.     Scheduled Meds: . aspirin EC  81 mg Oral Daily  . cilostazol  50 mg Oral BID AC  . colchicine  0.6 mg Oral Daily  . docusate sodium  100 mg Oral BID  . fluticasone  2 spray Each Nare Daily  . folic acid  1 mg Oral Daily  . heparin  5,000 Units Subcutaneous Q8H  . insulin aspart  0-15 Units Subcutaneous TID WC  . insulin aspart  0-5 Units Subcutaneous QHS  . insulin glargine  8 Units Subcutaneous QHS  . lisinopril  5 mg Oral Daily  . metoprolol  50 mg Oral BID  . montelukast  10 mg Oral QHS  . pantoprazole  40 mg Oral Q0600  . sodium chloride  3 mL Intravenous Q12H  . sodium chloride  3 mL Intravenous Q12H  . [START ON 11/18/2012] Vitamin D (Ergocalciferol)  50,000 Units Oral Q7 days   Continuous Infusions:   Principal Problem:   Atypical chest pain Active Problems:   Asthma   COPD (chronic obstructive pulmonary disease)   CKD (chronic kidney disease), stage III   Diastolic CHF, chronic        [name]  [facility] Pager [phone]. If 7PM-7AM, please  contact night-coverage at [email or web address], password [facility] 11/17/2012, 12:27 PM  LOS: 2 days     Addendum: At the time of vomiting, patient's HR dropped to low 30's and she had a 3 sec pause. We will hold the metoprolol and watch her on telemetry.   Kathlen Mody, MD.  [phone].

## 2012-11-17 NOTE — Consult Note (Signed)
Reason for Consult: Chest pain  Requesting Physician: Claybon Jabs  HPI: This is a 67 y.o. female with a past medical history significant for PVD, DM, COPD, GI bleed in 2011. She had a Myoview in 2012 that was low risk. I could find no record of documented CAD. She was admitted 11/15/12 with complaints of sharp, localized chest pain under her Lt breast.  She was actually to be discharged today but asked that we be called. She is very uncomfortable and had has sharp pain with any movement. When I went to sit her up she had sharp back pain under Lt scapula.  PMHx:  Past Medical History  Diagnosis Date  . Alcoholic cirrhosis   . Reflux esophagitis   . Gastroesophageal reflux   . Colon polyps   . CHF (congestive heart failure)   . Hypertension   . History of GI diverticular bleed   . Pneumonia     history of  . Asthma   . Poor circulation   . Anemia   . Blood transfusion   . Sinus problem   . Wears glasses   . Villous adenoma of colon 05/10/11  . PVD (peripheral vascular disease)   . Chronic obstructive pulmonary disease (COPD)   . Cirrhosis   . Anxiety   . Arthritis   . Depression   . Glaucoma   . Gout   . Acute venous embolism and thrombosis of deep vessels of proximal lower extremity   . Bruises easily   . Back pain   . Leg swelling   . Abdominal pain   . Shortness of breath   . Cough   . Acquired cyst of kidney   . Sleep apnea     no study done .  Marland Kitchen Diabetes mellitus     insulin dep   . Hepatitis    Past Surgical History  Procedure Laterality Date  . Abdominal hysterectomy    . Cataract extraction    . Colonoscopy    . Laparoscopic assissted total colectomy w/ j-pouch  04/22/11  . Ventral hernia repair  06/13/2012    Procedure: LAPAROSCOPIC VENTRAL HERNIA;  Surgeon: Ernestene Mention, MD;  Location: [facility] OR;  Service: General;  Laterality: N/A;  Laparoscopic Assisted Ventral Hernia with Mesh    FAMHx: Family History  Problem Relation Age of Onset  . Cancer  Brother     heent ca  . Cancer Sister     liver ca    SOCHx:  reports that she quit smoking about 6 years ago. She does not have any smokeless tobacco history on file. She reports that she does not drink alcohol or use illicit drugs.  ALLERGIES: Allergies  Allergen Reactions  . Penicillins Hives    ROS: Pertinent items are noted in HPI. complains of "Hot flashes" and nausea  HOME MEDICATIONS: Prescriptions prior to admission  Medication Sig Dispense Refill  . albuterol (PROVENTIL HFA;VENTOLIN HFA) 108 (90 BASE) MCG/ACT inhaler Inhale 2 puffs into the lungs every 6 (six) hours as needed. For shortness of breath      . aspirin 81 MG EC tablet Take 81 mg by mouth daily.        . cilostazol (PLETAL) 100 MG tablet Take 50 mg by mouth 2 (two) times daily before a meal.       . colchicine (COLCRYS) 0.6 MG tablet Take 0.6 mg by mouth daily.      . diclofenac sodium (VOLTAREN) 1 % GEL Apply 1 application topically daily  as needed. For leg pain      . diphenhydrAMINE (BENADRYL) 25 mg capsule Take 25 mg by mouth at bedtime as needed. For cough      . ergocalciferol (VITAMIN D2) 50000 UNITS capsule Take 50,000 Units by mouth once a week. On Monday      . fluticasone (FLONASE) 50 MCG/ACT nasal spray Place 2 sprays into the nose daily. 50 mcg /act 2 sprays nasal once a day       . folic acid (FOLVITE) 1 MG tablet Take 1 mg by mouth daily.      . furosemide (LASIX) 40 MG tablet Take 1 tablet (40 mg total) by mouth daily.  10 tablet  0  . glimepiride (AMARYL) 4 MG tablet Take 4 mg by mouth Daily.       Marland Kitchen glucose blood test strip 1 each by Other route as needed. Use as instructed      . Hydrocodone-Acetaminophen 5-300 MG TABS Take 1 tablet by mouth 2 (two) times daily as needed. For pain      . insulin glargine (LANTUS SOLOSTAR) 100 UNIT/ML injection Inject 8 Units into the skin at bedtime.       Marland Kitchen ketorolac (TORADOL) 10 MG tablet Take 1 tablet (10 mg total) by mouth every 6 (six) hours as needed  for pain.  20 tablet  0  . Lancets (ACCU-CHEK MULTICLIX) lancets as needed.      . lidocaine (LIDODERM) 5 % Place 1 patch onto the skin daily. Remove & Discard patch within 12 hours or as directed by MD       . lisinopril (PRINIVIL,ZESTRIL) 5 MG tablet Take 5 mg by mouth daily.      . metoprolol (LOPRESSOR) 50 MG tablet Take 50 mg by mouth Twice daily.       . montelukast (SINGULAIR) 10 MG tablet Take 10 mg by mouth at bedtime.        Marland Kitchen omeprazole (PRILOSEC) 40 MG capsule Take 40 mg by mouth 2 (two) times daily.       . potassium chloride (K-DUR) 10 MEQ tablet Take 1 tablet (10 mEq total) by mouth 2 (two) times daily.  10 tablet  0  . sitaGLIPtan (JANUVIA) 50 MG tablet Take 50 mg by mouth daily.        . temazepam (RESTORIL) 30 MG capsule Take 30 mg by mouth at bedtime as needed. For sleep      . triamcinolone cream (KENALOG) 0.5 % Apply 1 application topically daily as needed. For legs        HOSPITAL MEDICATIONS: I have reviewed the patient's current medications.  VITALS: Blood pressure 91/59, pulse 94, temperature 98.7 F (37.1 C), temperature source Oral, resp. rate 18, height 5' (1.524 m), weight 69.673 kg (153 lb 9.6 oz), SpO2 99.00%.  PHYSICAL EXAM: General appearance: alert, cooperative, mild distress and moderate distress Neck: no carotid bruit and no JVD Lungs: decreased breath sounds Definite chest wall tenderness and left costochondral tenderness to palpation Heart: regular rate and rhythm 1/6 sem Abdomen: obese, tympanic, positive bowel sounds, mid line surgical scar Extremities: no edema Pulses: decreased distal pulses bilat Skin: Skin color, texture, turgor normal. No rashes or lesions Neurologic: Grossly normal  LABS: Results for orders placed during the hospital encounter of 11/15/12 (from the past 48 hour(s))  CBC WITH DIFFERENTIAL     Status: Abnormal   Collection Time    11/15/12  2:30 PM      Result Value Range  WBC 10.0  4.0 - 10.5 K/uL   RBC 4.84  3.87 -  5.11 MIL/uL   Hemoglobin 12.4  12.0 - 15.0 g/dL   HCT 16.1  09.6 - 04.5 %   MCV 79.5  78.0 - 100.0 fL   MCH 25.6 (*) 26.0 - 34.0 pg   MCHC 32.2  30.0 - 36.0 g/dL   RDW 40.9  81.1 - 91.4 %   Platelets 237  150 - 400 K/uL   Neutrophils Relative 62  43 - 77 %   Neutro Abs 6.2  1.7 - 7.7 K/uL   Lymphocytes Relative 30  12 - 46 %   Lymphs Abs 3.0  0.7 - 4.0 K/uL   Monocytes Relative 7  3 - 12 %   Monocytes Absolute 0.7  0.1 - 1.0 K/uL   Eosinophils Relative 1  0 - 5 %   Eosinophils Absolute 0.1  0.0 - 0.7 K/uL   Basophils Relative 0  0 - 1 %   Basophils Absolute 0.0  0.0 - 0.1 K/uL  BASIC METABOLIC PANEL     Status: Abnormal   Collection Time    11/15/12  2:30 PM      Result Value Range   Sodium 141  135 - 145 mEq/L   Potassium 4.6  3.5 - 5.1 mEq/L   Chloride 107  96 - 112 mEq/L   CO2 23  19 - 32 mEq/L   Glucose, Bld 159 (*) 70 - 99 mg/dL   BUN 15  6 - 23 mg/dL   Creatinine, Ser 7.82 (*) 0.50 - 1.10 mg/dL   Calcium 9.5  8.4 - 95.6 mg/dL   GFR calc non Af Amer 48 (*) >90 mL/min   GFR calc Af Amer 55 (*) >90 mL/min   Comment:            The eGFR has been calculated     using the CKD EPI equation.     This calculation has not been     validated in all clinical     situations.     eGFR's persistently     <90 mL/min signify     possible Chronic Kidney Disease.  PRO B NATRIURETIC PEPTIDE     Status: Abnormal   Collection Time    11/15/12  5:10 PM      Result Value Range   Pro B Natriuretic peptide (BNP) 210.2 (*) 0 - 125 pg/mL  TROPONIN I     Status: None   Collection Time    11/15/12  5:10 PM      Result Value Range   Troponin I <0.30  <0.30 ng/mL   Comment:            Due to the release kinetics of cTnI,     a negative result within the first hours     of the onset of symptoms does not rule out     myocardial infarction with certainty.     If myocardial infarction is still suspected,     repeat the test at appropriate intervals.  GLUCOSE, CAPILLARY     Status:  Abnormal   Collection Time    11/15/12  9:41 PM      Result Value Range   Glucose-Capillary 53 (*) 70 - 99 mg/dL   Comment 1 Notify RN    TROPONIN I     Status: None   Collection Time    11/15/12 10:18 PM      Result Value Range  Troponin I <0.30  <0.30 ng/mL   Comment:            Due to the release kinetics of cTnI,     a negative result within the first hours     of the onset of symptoms does not rule out     myocardial infarction with certainty.     If myocardial infarction is still suspected,     repeat the test at appropriate intervals.  CBC     Status: Abnormal   Collection Time    11/15/12 10:22 PM      Result Value Range   WBC 11.0 (*) 4.0 - 10.5 K/uL   RBC 4.78  3.87 - 5.11 MIL/uL   Hemoglobin 12.2  12.0 - 15.0 g/dL   HCT 16.1  09.6 - 04.5 %   MCV 79.5  78.0 - 100.0 fL   MCH 25.5 (*) 26.0 - 34.0 pg   MCHC 32.1  30.0 - 36.0 g/dL   RDW 40.9  81.1 - 91.4 %   Platelets 243  150 - 400 K/uL  CREATININE, SERUM     Status: Abnormal   Collection Time    11/15/12 10:22 PM      Result Value Range   Creatinine, Ser 1.11 (*) 0.50 - 1.10 mg/dL   GFR calc non Af Amer 50 (*) >90 mL/min   GFR calc Af Amer 58 (*) >90 mL/min   Comment:            The eGFR has been calculated     using the CKD EPI equation.     This calculation has not been     validated in all clinical     situations.     eGFR's persistently     <90 mL/min signify     possible Chronic Kidney Disease.  GLUCOSE, CAPILLARY     Status: None   Collection Time    11/15/12 10:22 PM      Result Value Range   Glucose-Capillary 74  70 - 99 mg/dL   Comment 1 Notify RN    TROPONIN I     Status: None   Collection Time    11/16/12  4:35 AM      Result Value Range   Troponin I <0.30  <0.30 ng/mL   Comment:            Due to the release kinetics of cTnI,     a negative result within the first hours     of the onset of symptoms does not rule out     myocardial infarction with certainty.     If myocardial  infarction is still suspected,     repeat the test at appropriate intervals.  GLUCOSE, CAPILLARY     Status: Abnormal   Collection Time    11/16/12  7:33 AM      Result Value Range   Glucose-Capillary 115 (*) 70 - 99 mg/dL   Comment 1 Notify RN    TROPONIN I     Status: None   Collection Time    11/16/12 10:15 AM      Result Value Range   Troponin I <0.30  <0.30 ng/mL   Comment:            Due to the release kinetics of cTnI,     a negative result within the first hours     of the onset of symptoms does not rule out     myocardial infarction  with certainty.     If myocardial infarction is still suspected,     repeat the test at appropriate intervals.  GLUCOSE, CAPILLARY     Status: Abnormal   Collection Time    11/16/12 11:26 AM      Result Value Range   Glucose-Capillary 145 (*) 70 - 99 mg/dL  GLUCOSE, CAPILLARY     Status: None   Collection Time    11/16/12  4:52 PM      Result Value Range   Glucose-Capillary 83  70 - 99 mg/dL   Comment 1 Notify RN    GLUCOSE, CAPILLARY     Status: Abnormal   Collection Time    11/16/12  9:41 PM      Result Value Range   Glucose-Capillary 182 (*) 70 - 99 mg/dL   Comment 1 Notify RN    GLUCOSE, CAPILLARY     Status: None   Collection Time    11/17/12  7:11 AM      Result Value Range   Glucose-Capillary 96  70 - 99 mg/dL   Comment 1 Documented in Chart     Comment 2 Notify RN    GLUCOSE, CAPILLARY     Status: Abnormal   Collection Time    11/17/12 11:27 AM      Result Value Range   Glucose-Capillary 203 (*) 70 - 99 mg/dL   Comment 1 Documented in Chart     Comment 2 Notify RN    GLUCOSE, CAPILLARY     Status: Abnormal   Collection Time    11/17/12 12:39 PM      Result Value Range   Glucose-Capillary 154 (*) 70 - 99 mg/dL   Comment 1 Documented in Chart     Comment 2 Notify RN    CBC     Status: Abnormal   Collection Time    11/17/12  1:00 PM      Result Value Range   WBC 11.7 (*) 4.0 - 10.5 K/uL   RBC 4.49  3.87 - 5.11  MIL/uL   Hemoglobin 11.4 (*) 12.0 - 15.0 g/dL   HCT 16.1  09.6 - 04.5 %   MCV 80.4  78.0 - 100.0 fL   MCH 25.4 (*) 26.0 - 34.0 pg   MCHC 31.6  30.0 - 36.0 g/dL   RDW 40.9  81.1 - 91.4 %   Platelets 221  150 - 400 K/uL    IMAGING: Dg Chest 2 View  11/15/2012  *RADIOLOGY REPORT*  Clinical Data: Chest pain.  CHEST - 2 VIEW  Comparison: 06/19/2012.  Findings: The cardiac silhouette, mediastinal and hilar contours are within normal limits and stable.  There are chronic basilar scarring changes and mild pleural thickening.  No definite acute overlying pulmonary process.  No pulmonary edema.  Rounded density in the left upper lobe is artifact from an EKG lead.  The bony structures are intact.  There are numerous thoracic compression fractures which appear stable.  IMPRESSION: Chronic basilar scarring changes.  No acute overlying pulmonary process.   Original Report Authenticated By: Rudie Meyer, M.D.    Nm Pulmonary Perf And Vent  11/16/2012  *RADIOLOGY REPORT*  Clinical Data: Shortness of breath and chest pain.  NM PULMONARY VENTILATION AND PERFUSION SCAN  Radiopharmaceutical: CURIE MAA TECHNETIUM TO 27M ALBUMIN AGGREGATED 40 mCi aerosolized technetium DTPA  Comparison: 06/19/2012.  Findings: The ventilation scan is limited by a significant clumping of the radiopharmaceutical.  No large ventilation defects are identified.  The perfusion lung scan demonstrates a few patchy subsegmental perfusion defects but no larger well-defined segmental or subsegmental perfusion abnormalities.  IMPRESSION: Very low probability ventilation perfusion lung scan for pulmonary embolism.   Original Report Authenticated By: Rudie Meyer, M.D.     IMPRESSION: Principal Problem:   Atypical chest pain- no history of CAD. Myoview low risk 2012 Active Problems:   COPD (chronic obstructive pulmonary disease)   Controlled diabetes mellitus type 2 with complications   CKD (chronic kidney disease), stage III   PVD,  LEIA/LIIA and RCIA pta 7/09- dopplers 8/13- ABIs 0.77   DJD, multiple compression fractures noted on CXR   Sleep apnea   PUD (peptic ulcer disease) GI bleed in past (2011)   Cirrhosis of liver   Colon cancer, lap chole 2012   RECOMMENDATION: It appears her pain is possibly from compression fractures. It does not sound cardiac. NSAID use will be problematic with her hx of PUD.  Check T-spine XR, ? kyphoplasty candidate.  Time Spent Directly with Patient: 45 minutes  KILROY,LUKE K 11/17/2012, 1:56 PM    Patient seen and examined. Agree with assessment and plan.66 yp AAF admitted with chest pain. Pain is noncardiac. She is significantly tender to palpation over costochondral region and chest wall and also has multiple compression fractures of spine. Pain is there constantly but she notes intense sharp twinges. Suspect costochondritis. Recommend toradol IV. Consider thoracic spine films vs MRI , ? If component of radicular pain. At present do not feel further cardiac w/u is necessary for this patien.   Lennette Bihari, MD, [facility] 11/17/2012 2:12 PM

## 2012-11-17 NOTE — Progress Notes (Signed)
UR Completed Sayde Lish Graves-Bigelow, RN,BSN [phone]  

## 2012-11-18 ENCOUNTER — Inpatient Hospital Stay (HOSPITAL_COMMUNITY): Payer: PRIVATE HEALTH INSURANCE

## 2012-11-18 DIAGNOSIS — R0789 Other chest pain: Principal | ICD-10-CM

## 2012-11-18 DIAGNOSIS — K279 Peptic ulcer, site unspecified, unspecified as acute or chronic, without hemorrhage or perforation: Secondary | ICD-10-CM

## 2012-11-18 LAB — CBC
HCT: 31.2 % — ABNORMAL LOW (ref 36.0–46.0)
Hemoglobin: 10.1 g/dL — ABNORMAL LOW (ref 12.0–15.0)
MCH: 25.3 pg — ABNORMAL LOW (ref 26.0–34.0)
MCHC: 32.4 g/dL (ref 30.0–36.0)
MCV: 78.2 fL (ref 78.0–100.0)
Platelets: 214 10*3/uL (ref 150–400)
RBC: 3.99 MIL/uL (ref 3.87–5.11)
RDW: 15.3 % (ref 11.5–15.5)
WBC: 11.3 10*3/uL — ABNORMAL HIGH (ref 4.0–10.5)

## 2012-11-18 LAB — URINALYSIS, ROUTINE W REFLEX MICROSCOPIC
Bilirubin Urine: NEGATIVE
Leukocytes, UA: NEGATIVE
Nitrite: NEGATIVE
Specific Gravity, Urine: 1.02 (ref 1.005–1.030)
Urobilinogen, UA: 0.2 mg/dL (ref 0.0–1.0)
pH: 5 (ref 5.0–8.0)

## 2012-11-18 LAB — BASIC METABOLIC PANEL
BUN: 41 mg/dL — ABNORMAL HIGH (ref 6–23)
CO2: 25 mEq/L (ref 19–32)
Calcium: 8.8 mg/dL (ref 8.4–10.5)
Chloride: 105 mEq/L (ref 96–112)
Creatinine, Ser: 2.27 mg/dL — ABNORMAL HIGH (ref 0.50–1.10)
GFR calc Af Amer: 24 mL/min — ABNORMAL LOW (ref >90)
GFR calc non Af Amer: 21 mL/min — ABNORMAL LOW (ref >90)
Glucose, Bld: 91 mg/dL (ref 70–99)
Potassium: 4.6 mEq/L (ref 3.5–5.1)
Sodium: 139 mEq/L (ref 135–145)

## 2012-11-18 LAB — CREATININE, URINE, RANDOM: Creatinine, Urine: 176.74 mg/dL

## 2012-11-18 MED ORDER — METOPROLOL TARTRATE 12.5 MG HALF TABLET
12.5000 mg | ORAL_TABLET | Freq: Two times a day (BID) | ORAL | Status: DC
  Administered 2012-11-19 (×2): 12.5 mg via ORAL
  Filled 2012-11-18 (×3): qty 1

## 2012-11-18 NOTE — Progress Notes (Signed)
IN TO PLACE A NEW PIV PER STAFF RN REQUEST, 1 ATTEMPT TO LEFT FOREARM WAS UNSUCCESSFUL, PT STATES " I AM NOT GOING TO BE STUCK AGAIN ", STAFF RN MADE AWARE.

## 2012-11-18 NOTE — Progress Notes (Signed)
Subjective:  Chest pain is better today.  Objective:  Vital Signs in the last 24 hours: Temp:  [98.8 F (37.1 C)-99.5 F (37.5 C)] 98.8 F (37.1 C) (03/23 0500) Pulse Rate:  [89-107] 95 (03/23 0500) Resp:  [18-20] 20 (03/23 0500) BP: (90-113)/(52-75) 94/52 mmHg (03/23 0700) SpO2:  [85 %-99 %] 92 % (03/23 0500)  Intake/Output from previous day:  Intake/Output Summary (Last 24 hours) at 11/18/12 1007 Last data filed at 11/17/12 2100  Gross per 24 hour  Intake      0 ml  Output      2 ml  Net     -2 ml   . aspirin EC  81 mg Oral Daily  . cilostazol  50 mg Oral BID AC  . colchicine  0.6 mg Oral Daily  . docusate sodium  100 mg Oral BID  . fluticasone  2 spray Each Nare Daily  . folic acid  1 mg Oral Daily  . heparin  5,000 Units Subcutaneous Q8H  . insulin aspart  0-15 Units Subcutaneous TID WC  . insulin aspart  0-5 Units Subcutaneous QHS  . insulin glargine  8 Units Subcutaneous QHS  . montelukast  10 mg Oral QHS  . pantoprazole  40 mg Oral Q0600  . sodium chloride  3 mL Intravenous Q12H  . sodium chloride  3 mL Intravenous Q12H  . Vitamin D (Ergocalciferol)  50,000 Units Oral Q7 days    Physical Exam: General appearance: alert, cooperative, no distress and moderately obese No JVD Lungs: clear to auscultation bilaterally Heart: regular rate and rhythm 1/6 sem Chest wall tenderness significantly improved with toradol No edema   Rate: 90  Rhythm: normal sinus rhythm  Lab Results:  Recent Labs  11/17/12 1300 11/18/12 0610  WBC 11.7* 11.3*  HGB 11.4* 10.1*  PLT 221 214    Recent Labs  11/17/12 1300 11/18/12 0610  NA 139 139  K 4.3 4.6  CL 104 105  CO2 23 25  GLUCOSE 160* 91  BUN 35* 41*  CREATININE 1.80* 2.27*    Recent Labs  11/16/12 0435 11/16/12 1015  TROPONINI <0.30 <0.30   Hepatic Function Panel  Recent Labs  11/17/12 1300  PROT 6.6  ALBUMIN 2.9*  AST 26  ALT 15  ALKPHOS 78  BILITOT 0.2*   No results found for this basename:  CHOL,  in the last 72 hours No results found for this basename: INR,  in the last 72 hours  Imaging: Imaging results have been reviewed  Cardiac Studies:  Assessment/Plan:   Principal Problem:   Atypical chest pain- no history of CAD. Myoview low risk 2012 Active Problems:   COPD (chronic obstructive pulmonary disease)   Controlled diabetes mellitus type 2 with complications   CKD (chronic kidney disease), stage III   PVD, LEIA/LIIA and RCIA pta 7/09- dopplers 8/13- ABIs 0.77   DJD, multiple compression fractures noted on CXR   Sleep apnea   PUD (peptic ulcer disease) GI bleed in past (2011)   Cirrhosis of liver   Colon cancer, lap chole 2012   Plan- No further cardiac work up. We will arrange for OP follow up.   [employer] PA-C 11/18/2012, 10:07 AM  THORACIC SPINE - 2 VIEW + SWIMMERS  Comparison: Chest radiographs 11/15/2012 and earlier.  Findings: Multilevel thoracic compression fractures including the  levels T4 through T6, T8, and T11. These appears stable since  06/05/2012. Osteopenia. Calcified tracheobronchial tree. Grossly  intact visualized lumbar levels. The Cervicothoracic  junction  alignment is within normal limits. Abnormal lungs similar to  11/15/2012.  IMPRESSION:  Osteopenia with multilevel thoracic compression fractures which  appear stable since 06/05/2012.    Patient seen and examined. Agree with assessment and plan. Musculoskeetal chest pain improved with toradol. Will sign off from cardiac perspective.   Lennette Bihari, MD, [facility] 11/18/2012 10:21 AM

## 2012-11-18 NOTE — Progress Notes (Signed)
Pt's IV infiltrated. IV team paged for another PIV. Unsuccessful attempt made by IV team. Pt refusing a second attempt. Dr. Blake Divine made aware, asked to encourage PO fluid intake instead. Patient made aware. Will continue to encourage PO intake.

## 2012-11-18 NOTE — Care Management Utilization Note (Signed)
UR completed 

## 2012-11-18 NOTE — Progress Notes (Signed)
[facility] PROGRESS NOTE  [name]:[id number] DOB: 1946/05/21 DOA: 11/15/2012 PCP: Dorrene German, MD  Assessment/Plan: 1. Non cardiac chest pain/ Atypical chest pain possibly costochondritis given she has tenderness to palpation on the left sided chest wall. Started her on ibuprofen, but she appears to have epigastric pain when we started her on ibuprofen. So I made it prn, started her on protonix and GI cocktail. Despite both GI cocktail and protonix she has persistent chest pain and she requested Dr berry. Spoke to Dr Tresa Endo requesting  for an inpatient consultation. She was seen by Dr Tresa Endo and recommended no further cardiology work up needed.  - ACS ruled out with negative enzymes and normal EKG. V/q scan obtained showed very low probability for PE.   2. Acute renal failure on CKD stage 3.: probably pre renal ffrom vomiting and dehydration. Started her on IV fluids.and we are getting  A UA, US of the kidney and urine electrolytes.   3. DM:  CBG (last 3)   Recent Labs  11/17/12 2107 11/18/12 0743 11/18/12 1141  GLUCAP 132* 94 106*    Resume SSI and lantus.   4. dvt prophylaxis.   5. PVD: resume aspirin and cilostazol.   6. Hypertension: BP parameters running a little low. Held the lisinopril.   7. An episode of vomiting yesterday: transaminases within normal limits, lipase normal. ABD film does not show obstruction. CXR shows bibasilar atelectasis. She is on clear liquids, will advance diet as tolerated.   Code Status: FULL CODE Family Communication: NONE AT BEDSIDE Disposition Plan: pending.    Consultants:  [facility]  HPI/Subjective: She is chest pain free. No tenderness on palpation.    Objective: Filed Vitals:   11/17/12 1427 11/17/12 2030 11/18/12 0500 11/18/12 0700  BP: 113/75 91/53 90/56  94/52  Pulse: 89 107 95   Temp: 98.8 F (37.1 C) 99.5 F (37.5 C) 98.8 F (37.1 C)   TempSrc: Oral     Resp: 18 20 20    Height:      Weight:      SpO2:  92% 93% 92%     Intake/Output Summary (Last 24 hours) at 11/18/12 1202 Last data filed at 11/18/12 1100  Gross per 24 hour  Intake      0 ml  Output    201 ml  Net   -201 ml   Filed Weights   11/15/12 2144 11/16/12 0439 11/17/12 0544  Weight: 69.899 kg (154 lb 1.6 oz) 69.945 kg (154 lb 3.2 oz) 69.673 kg (153 lb 9.6 oz)    Exam:   General:  Alert afebrile in mild distress from vomiting.   Cardiovascular: s1s2 RRR,   Respiratory: CT AB, chest wall tenderness present on the left side  Abdomen: soft NT ND BS+  Musculoskeletal: TENDERNESS of the left sided chest wall. No pedal edema.   Data Reviewed: Basic Metabolic Panel:  Recent Labs Lab 11/15/12 1430 11/15/12 2222 11/17/12 1300 11/18/12 0610  NA 141  --  139 139  K 4.6  --  4.3 4.6  CL 107  --  104 105  CO2 23  --  23 25  GLUCOSE 159*  --  160* 91  BUN 15  --  35* 41*  CREATININE 1.15* 1.11* 1.80* 2.27*  CALCIUM 9.5  --  9.1 8.8   Liver Function Tests:  Recent Labs Lab 11/17/12 1300  AST 26  ALT 15  ALKPHOS 78  BILITOT 0.2*  PROT 6.6  ALBUMIN 2.9*  Recent Labs Lab 11/17/12 1300  LIPASE 37   No results found for this basename: AMMONIA,  in the last 168 hours CBC:  Recent Labs Lab 11/15/12 1430 11/15/12 2222 11/17/12 1300 11/18/12 0610  WBC 10.0 11.0* 11.7* 11.3*  NEUTROABS 6.2  --   --   --   HGB 12.4 12.2 11.4* 10.1*  HCT 38.5 38.0 36.1 31.2*  MCV 79.5 79.5 80.4 78.2  PLT 237 243 221 214   Cardiac Enzymes:  Recent Labs Lab 11/15/12 1710 11/15/12 2218 11/16/12 0435 11/16/12 1015  TROPONINI <0.30 <0.30 <0.30 <0.30   BNP (last 3 results)  Recent Labs  06/18/12 2107 11/15/12 1710  PROBNP 847.9* 210.2*   CBG:  Recent Labs Lab 11/17/12 1239 11/17/12 1643 11/17/12 2107 11/18/12 0743 11/18/12 1141  GLUCAP 154* 143* 132* 94 106*    No results found for this or any previous visit (from the past 240 hour(s)).   Studies: Dg Chest 2 View  11/17/2012  *RADIOLOGY  REPORT*  Clinical Data: 67 year old female with shortness of breath.  CHEST - 2 VIEW  Comparison: 11/15/2012 chest radiograph  Findings: Cardiomegaly is again identified. Bibasilar atelectasis and very small bilateral pleural effusions are noted. There is no evidence of airspace disease, pulmonary edema or pneumothorax. No acute or suspicious bony abnormalities are present. Compression fractures within the thoracic spine are again noted.  IMPRESSION: Cardiomegaly with bibasilar atelectasis and very small bilateral pleural effusions.   Original Report Authenticated By: Harmon Pier, M.D.    Dg Thoracic Spine W/swimmers  11/17/2012  *RADIOLOGY REPORT*  Clinical Data: 67 year old female with upper back pain.  THORACIC SPINE - 2 VIEW + SWIMMERS  Comparison: Chest radiographs 11/15/2012 and earlier.  Findings: Multilevel thoracic compression fractures including the levels T4 through T6, T8, and T11.  These appears stable since 06/05/2012.  Osteopenia.  Calcified tracheobronchial tree.  Grossly intact visualized lumbar levels.  The Cervicothoracic junction alignment is within normal limits.  Abnormal lungs similar to 11/15/2012.  IMPRESSION: Osteopenia with multilevel thoracic compression fractures which appear stable since 06/05/2012.   Original Report Authenticated By: Erskine Speed, M.D.    Nm Pulmonary Perf And Vent  11/16/2012  *RADIOLOGY REPORT*  Clinical Data: Shortness of breath and chest pain.  NM PULMONARY VENTILATION AND PERFUSION SCAN  Radiopharmaceutical: CURIE MAA TECHNETIUM TO 22M ALBUMIN AGGREGATED 40 mCi aerosolized technetium DTPA  Comparison: 06/19/2012.  Findings: The ventilation scan is limited by a significant clumping of the radiopharmaceutical.  No large ventilation defects are identified.  The perfusion lung scan demonstrates a few patchy subsegmental perfusion defects but no larger well-defined segmental or subsegmental perfusion abnormalities.  IMPRESSION: Very low probability  ventilation perfusion lung scan for pulmonary embolism.   Original Report Authenticated By: Rudie Meyer, M.D.    Dg Abd 2 Views  11/17/2012  *RADIOLOGY REPORT*  Clinical Data: Abdominal pain and distention  ABDOMEN - 2 VIEW  Comparison: CT abdomen 01/13/2012  Findings: Negative for bowel obstruction.  Surgical bowel clips in the right abdomen.  There is stool in the right and left colon.  No free air on the decubitus view.  Bilateral iliac artery stents.  IMPRESSION: Constipation.  Negative for bowel obstruction.   Original Report Authenticated By: Janeece Riggers, M.D.     Scheduled Meds: . aspirin EC  81 mg Oral Daily  . cilostazol  50 mg Oral BID AC  . colchicine  0.6 mg Oral Daily  . docusate sodium  100 mg Oral BID  . fluticasone  2 spray Each Nare Daily  . folic acid  1 mg Oral Daily  . heparin  5,000 Units Subcutaneous Q8H  . insulin aspart  0-15 Units Subcutaneous TID WC  . insulin aspart  0-5 Units Subcutaneous QHS  . insulin glargine  8 Units Subcutaneous QHS  . montelukast  10 mg Oral QHS  . pantoprazole  40 mg Oral Q0600  . sodium chloride  3 mL Intravenous Q12H  . sodium chloride  3 mL Intravenous Q12H  . Vitamin D (Ergocalciferol)  50,000 Units Oral Q7 days   Continuous Infusions: . sodium chloride 50 mL/hr at 11/17/12 1735    Principal Problem:   Atypical chest pain- no history of CAD. 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 If 7PM-7AM, please contact night-coverage at [email or web address], password [facility] 11/18/2012, 12:02 PM  LOS: 3 days

## 2012-11-19 ENCOUNTER — Other Ambulatory Visit (HOSPITAL_COMMUNITY): Payer: Self-pay | Admitting: Cardiology

## 2012-11-19 LAB — GLUCOSE, CAPILLARY
Glucose-Capillary: 96 mg/dL (ref 70–99)
Glucose-Capillary: 172 mg/dL — ABNORMAL HIGH (ref 70–99)

## 2012-11-19 LAB — BASIC METABOLIC PANEL
CO2: 23 mEq/L (ref 19–32)
Chloride: 103 mEq/L (ref 96–112)
Glucose, Bld: 183 mg/dL — ABNORMAL HIGH (ref 70–99)
Potassium: 4.9 mEq/L (ref 3.5–5.1)
Sodium: 136 mEq/L (ref 135–145)

## 2012-11-19 MED ORDER — POTASSIUM CHLORIDE ER 10 MEQ PO TBCR: 10.0000 meq | EXTENDED_RELEASE_TABLET | Freq: Two times a day (BID) | ORAL | Status: DC

## 2012-11-19 MED ORDER — LISINOPRIL 5 MG PO TABS: 5.0000 mg | ORAL_TABLET | Freq: Every day | ORAL | Status: DC

## 2012-11-19 MED ORDER — METOPROLOL TARTRATE 50 MG PO TABS: 25.0000 mg | ORAL_TABLET | Freq: Two times a day (BID) | ORAL | Status: DC

## 2012-11-19 MED ORDER — FUROSEMIDE 40 MG PO TABS: 40.0000 mg | ORAL_TABLET | Freq: Every day | ORAL | Status: DC

## 2012-11-19 NOTE — Care Management Note (Signed)
    Page 1 of 1   11/19/2012     10:34:11 AM   CARE MANAGEMENT NOTE 11/19/2012  Patient:  [name], [name]   Account Number:  [id number]  Date Initiated:  11/19/2012  Documentation initiated by:  GRAVES-BIGELOW,Linsy Ehresman  Subjective/Objective Assessment:   Pt admitted with cp and epigastric pain. Plan for d/c home when stable.     Action/Plan:   CM did discuss HH services with pt and she is agreeable to HHPT services with [facility]. she has used them before. CM will make referral for services and SOC to beign within 24-48 hours post d/c. Pt has an aide that provides an hour 5 days a week.    Anticipated DC Date:  11/19/2012   Anticipated DC Plan:  HOME W HOME HEALTH SERVICES      DC Planning Services  CM consult      [facility] Choice  HOME HEALTH   Choice offered to / List presented to:          [facility] arranged  HH-2 PT      [facility] agency  [facility]   Status of service:  Completed, signed off Medicare Important Message given?   (If response is "NO", the following Medicare IM given date fields will be blank) Date Medicare IM given:   Date Additional Medicare IM given:    Discharge Disposition:  HOME W HOME HEALTH SERVICES  Per UR Regulation:  Reviewed for med. necessity/level of care/duration of stay  If discussed at Long Length of Stay Meetings, dates discussed:    Comments:

## 2012-11-19 NOTE — Progress Notes (Signed)
Physical Therapy Treatment Patient Details Name: [name] MRN: [id number] DOB: [birth year] Today's Date: 11/19/2012 Time: 4098-1191 PT Time Calculation (min): 19 min  PT Assessment / Plan / Recommendation Comments on Treatment Session  67 yo with h/o falls yet cannot describe details related to her last fall. She is agreeable to use RW at home. Reports she only uses her home O2 "sometimes" however SaO2 decr to 83% on RA with ambulation. Educated to wear O2 with acitivty at home (at rest on RA, able to bring SaO2 back to 93%).     Follow Up Recommendations  Home health PT     Does the patient have the potential to tolerate intense rehabilitation     Barriers to Discharge        Equipment Recommendations  Other (comment) (bedside commode)    Recommendations for Other Services    Frequency Min 3X/week   Plan Discharge plan remains appropriate;Other (comment) (per MD to d/c today)    Precautions / Restrictions Precautions Precautions: Fall   Pertinent Vitals/Pain SaO2 93% on 2L; 93% on RA at rest SaO2 83% on RA with gait; regained SaO2 to 93% on RA with rest x 1 minute HR 90-102    Mobility  Bed Mobility Bed Mobility: Not assessed Details for Bed Mobility Assistance: sitting on EOB on arrival Transfers Transfers: Sit to Stand;Stand to Sit Sit to Stand: 6: Modified independent (Device/Increase time) Stand to Sit: 6: Modified independent (Device/Increase time) Details for Transfer Assistance: with RW Ambulation/Gait Ambulation/Gait Assistance: 5: Supervision Ambulation Distance (Feet): 100 Feet Assistive device: Rolling walker Ambulation/Gait Assistance Details: Pt reports she also has a 2 wheeled RW and demonstrated safe use of RW with gait; required cues to keep RW with her all the way back to bed and until she turns to sit down Gait Pattern: Step-through pattern;Wide base of support    Exercises     PT Diagnosis:    PT Problem List:   PT Treatment  Interventions:     PT Goals Acute Rehab PT Goals Pt will go Sit to Stand: with modified independence PT Goal: Sit to Stand - Progress: Met Pt will Transfer Bed to Chair/Chair to Bed: with modified independence PT Transfer Goal: Bed to Chair/Chair to Bed - Progress: Progressing toward goal Pt will Ambulate: with modified independence;with least restrictive assistive device PT Goal: Ambulate - Progress: Progressing toward goal  Visit Information  Last PT Received On: 11/19/12 Assistance Needed: +1    Subjective Data      Cognition  Cognition Overall Cognitive Status: Appears within functional limits for tasks assessed/performed    Balance     End of Session PT - End of Session Equipment Utilized During Treatment: Gait belt Activity Tolerance: Treatment limited secondary to medical complications (Comment) (decr SaO2 on RA) Patient left: in bed;with call bell/phone within reach (sit EOB (pt deferred chair--does not like))   GP     Worthy Boschert 11/19/2012, 9:23 AM  11/19/2012 Veda Canning, PT Pager: [phone]

## 2012-11-28 NOTE — Discharge Summary (Signed)
Physician Discharge Summary  [name] NWG:[id number] DOB: [birth year] DOA: 11/15/2012  PCP: Dorrene German, MD  Admit date: 11/15/2012 Discharge date: 11/19/2012    Recommendations for Outpatient Follow-up:  1. Follow up with cardiology and PCP as recommended  Discharge Diagnoses:  Principal Problem:   Atypical chest pain- no history of CAD. Myoview low risk 2012 Active Problems:   COPD (chronic obstructive pulmonary disease)   Sleep apnea   Controlled diabetes mellitus type 2 with complications   CKD (chronic kidney disease), stage III   PVD, LEIA/LIIA and RCIA pta 7/09- dopplers 8/13- ABIs 0.77   PUD (peptic ulcer disease) GI bleed in past (2011)   Cirrhosis of liver   Colon cancer, lap chole 2012   DJD, multiple compression fractures noted on CXR   Discharge Condition: fair.  Diet recommendation: carb modified diet.   Filed Weights   11/15/12 2144 11/16/12 0439 11/17/12 0544  Weight: 69.899 kg (154 lb 1.6 oz) 69.945 kg (154 lb 3.2 oz) 69.673 kg (153 lb 9.6 oz)    History of present illness:  [name] is an 67 y.o. female with history of congestive heart failure, GERD, prior alcohol use with alcoholic cirrhosis, hypertension, congestive heart failure, COPD and asthma, anxiety, insulin-dependent diabetes, this morning with left-sided chest pain, sharp, nonradiating, slightly pleuritic and slightly palpable, without accompanying shortness of breath, nausea, vomiting or diaphoresis. She denied like swelling or calf tenderness, distant travel or recent surgery. Evaluation in emergency room included an EKG which showed no acute ischemic changes, negative initial troponin, clear chest x-ray. Her creatinine is 1.15. She has no leukocytosis, fever, chills, or productive cough. Hospitalist was asked to admit her for cardiac rule out. She has no symptom suggestive of been in acute congestive heart failure.   Hospital Course:  1. Non cardiac chest pain/ Atypical chest pain  possibly costochondritis given she has tenderness to palpation on the left sided chest wall. Started her on ibuprofen, but she appears to have epigastric pain when we started her on ibuprofen. So I made it prn, started her on protonix and GI cocktail. Despite both GI cocktail and protonix she has persistent chest pain, she was started on NSAIDS and her pain improved.  and she requested Dr berry. Spoke to Dr Tresa Endo requesting for an inpatient consultation. She was seen by Dr Tresa Endo and recommended no further cardiology work up needed.  - ACS ruled out with negative enzymes and normal EKG. V/q scan obtained showed very low probability for PE.  2. Acute renal failure on CKD stage 3.: probably pre renal ffrom vomiting and dehydration. Started her on IV fluids.and her UA, US kidney did not show infection or obstruction respectively.  3. DM:  CBG (last 3)   Recent Labs   11/17/12 2107  11/18/12 0743  11/18/12 1141   GLUCAP  132*  94  106*    Resume lantus.  5. PVD: resume aspirin and cilostazol.  6. Hypertension: resume home medications.  7. An episode of vomiting yesterday: transaminases within normal limits, lipase normal. ABD film does not show obstruction. CXR shows bibasilar atelectasis. She is on clear liquids,  advanced diet as tolerated.      Consultations:  cardiology  Discharge Exam: Filed Vitals:   11/18/12 2100 11/19/12 0020 11/19/12 0500 11/19/12 1358  BP: 169/77 108/62 135/68 124/70  Pulse: 108  81 80  Temp: 98.7 F (37.1 C)  98.6 F (37 C) 98 F (36.7 C)  TempSrc:    Oral  Resp: 20  18 18   Height:      Weight:      SpO2: 94%  92% 94%   General: Alert afebrile in mild distress from vomiting.  Cardiovascular: s1s2 RRR,  Respiratory: CT AB, chest wall tenderness present on the left side  Abdomen: soft NT ND BS+  Musculoskeletal: resolved tenderness of the left sided chest wall. No pedal edema.    Discharge Instructions  Discharge Orders   Future Appointments  Provider Department Dept Phone   12/03/2012 4:10 PM [facility]  [facility]ING [phone]   Patient should wear two piece clothing and wear no powder or deodorant. Patient should arrive 15 minutes early.   Future Orders Complete By Expires     Activity as tolerated - No restrictions  As directed     Diet - low sodium heart healthy  As directed     Discharge instructions  As directed     Comments:      Follow up with PCP in one week and recheck your bmp in one week at your PCP office. Request referral to gastroenterologist for outpatient evaluation of PUD/ and for possible EGD.        Medication List    STOP taking these medications       COLCRYS 0.6 MG tablet  Generic drug:  colchicine     ketorolac 10 MG tablet  Commonly known as:  TORADOL      TAKE these medications       accu-chek multiclix lancets  as needed.     albuterol 108 (90 BASE) MCG/ACT inhaler  Commonly known as:  PROVENTIL HFA;VENTOLIN HFA  Inhale 2 puffs into the lungs every 6 (six) hours as needed. For shortness of breath     aspirin 81 MG EC tablet  Take 81 mg by mouth daily.     cilostazol 100 MG tablet  Commonly known as:  PLETAL  Take 50 mg by mouth 2 (two) times daily before a meal.     diclofenac sodium 1 % Gel  Commonly known as:  VOLTAREN  Apply 1 application topically daily as needed. For leg pain     diphenhydrAMINE 25 mg capsule  Commonly known as:  BENADRYL  Take 25 mg by mouth at bedtime as needed. For cough     ergocalciferol 50000 UNITS capsule  Commonly known as:  VITAMIN D2  Take 50,000 Units by mouth once a week. On Monday     fluticasone 50 MCG/ACT nasal spray  Commonly known as:  FLONASE  Place 2 sprays into the nose daily. 50 mcg /act 2 sprays nasal once a day     folic acid 1 MG tablet  Commonly known as:  FOLVITE  Take 1 mg by mouth daily.     furosemide 40 MG tablet  Commonly known as:  LASIX  Take 1 tablet (40 mg total) by mouth daily.      glimepiride 4 MG tablet  Commonly known as:  AMARYL  Take 4 mg by mouth Daily.     glucose blood test strip  1 each by Other route as needed. Use as instructed     Hydrocodone-Acetaminophen 5-300 MG Tabs  Take 1 tablet by mouth 2 (two) times daily as needed. For pain     LANTUS SOLOSTAR 100 UNIT/ML injection  Generic drug:  insulin glargine  Inject 8 Units into the skin at bedtime.     lidocaine 5 %  Commonly known as:  LIDODERM  Place 1 patch onto the skin daily. Remove & Discard patch within 12 hours or as directed by MD     lisinopril 5 MG tablet  Commonly known as:  PRINIVIL,ZESTRIL  Take 1 tablet (5 mg total) by mouth daily.     metoprolol 50 MG tablet  Commonly known as:  LOPRESSOR  Take 0.5 tablets (25 mg total) by mouth 2 (two) times daily.     montelukast 10 MG tablet  Commonly known as:  SINGULAIR  Take 10 mg by mouth at bedtime.     omeprazole 40 MG capsule  Commonly known as:  PRILOSEC  Take 40 mg by mouth 2 (two) times daily.     potassium chloride 10 MEQ tablet  Commonly known as:  K-DUR  Take 1 tablet (10 mEq total) by mouth 2 (two) times daily.     sitaGLIPtin 50 MG tablet  Commonly known as:  JANUVIA  Take 50 mg by mouth daily.     temazepam 30 MG capsule  Commonly known as:  RESTORIL  Take 30 mg by mouth at bedtime as needed. For sleep     triamcinolone cream 0.5 %  Commonly known as:  KENALOG  Apply 1 application topically daily as needed. For legs           Follow-up Information   Follow up with Runell Gess, MD. (office will call for in August)    Contact information:   [address] [place] [address] [phone]        The results of significant diagnostics from this hospitalization (including imaging, microbiology, ancillary and laboratory) are listed below for reference.    Significant Diagnostic Studies: Dg Chest 2 View  11/17/2012  *RADIOLOGY REPORT*  Clinical Data: 67 year old female with shortness  of breath.  CHEST - 2 VIEW  Comparison: 11/15/2012 chest radiograph  Findings: Cardiomegaly is again identified. Bibasilar atelectasis and very small bilateral pleural effusions are noted. There is no evidence of airspace disease, pulmonary edema or pneumothorax. No acute or suspicious bony abnormalities are present. Compression fractures within the thoracic spine are again noted.  IMPRESSION: Cardiomegaly with bibasilar atelectasis and very small bilateral pleural effusions.   Original Report Authenticated By: Harmon Pier, M.D.    Dg Chest 2 View  11/15/2012  *RADIOLOGY REPORT*  Clinical Data: Chest pain.  CHEST - 2 VIEW  Comparison: 06/19/2012.  Findings: The cardiac silhouette, mediastinal and hilar contours are within normal limits and stable.  There are chronic basilar scarring changes and mild pleural thickening.  No definite acute overlying pulmonary process.  No pulmonary edema.  Rounded density in the left upper lobe is artifact from an EKG lead.  The bony structures are intact.  There are numerous thoracic compression fractures which appear stable.  IMPRESSION: Chronic basilar scarring changes.  No acute overlying pulmonary process.   Original Report Authenticated By: Rudie Meyer, M.D.    Dg Thoracic Spine W/swimmers  11/17/2012  *RADIOLOGY REPORT*  Clinical Data: 67 year old female with upper back pain.  THORACIC SPINE - 2 VIEW + SWIMMERS  Comparison: Chest radiographs 11/15/2012 and earlier.  Findings: Multilevel thoracic compression fractures including the levels T4 through T6, T8, and T11.  These appears stable since 06/05/2012.  Osteopenia.  Calcified tracheobronchial tree.  Grossly intact visualized lumbar levels.  The Cervicothoracic junction alignment is within normal limits.  Abnormal lungs similar to 11/15/2012.  IMPRESSION: Osteopenia with multilevel thoracic compression fractures which appear stable since 06/05/2012.   Original Report Authenticated  By: Erskine Speed, M.D.    US  Renal  11/18/2012  *RADIOLOGY REPORT*  Clinical Data: Acute on chronic renal failure.  RENAL/URINARY TRACT ULTRASOUND COMPLETE  Comparison:  MRI 02/17/2012.  Findings:  Right Kidney:  10.3 cm in length.  Normal renal cortical thickness and slight increased echogenicity suggesting medical renal disease. No hydronephrosis.  Left Kidney:  9.7 cm in length.  Normal renal cortical thickness and slight increased echogenicity.  Small exophytic mid pole lesion is stable.  No hydronephrosis.  Bladder:  Moderately distended.  Bilateral ureteral jets are noted.  IMPRESSION:  1.  Mild increased echogenicity of both kidneys suggesting medical renal disease. 2.  No hydronephrosis.   Original Report Authenticated By: Rudie Meyer, M.D.    Nm Pulmonary Perf And Vent  11/16/2012  *RADIOLOGY REPORT*  Clinical Data: Shortness of breath and chest pain.  NM PULMONARY VENTILATION AND PERFUSION SCAN  Radiopharmaceutical: CURIE MAA TECHNETIUM TO 71M ALBUMIN AGGREGATED 40 mCi aerosolized technetium DTPA  Comparison: 06/19/2012.  Findings: The ventilation scan is limited by a significant clumping of the radiopharmaceutical.  No large ventilation defects are identified.  The perfusion lung scan demonstrates a few patchy subsegmental perfusion defects but no larger well-defined segmental or subsegmental perfusion abnormalities.  IMPRESSION: Very low probability ventilation perfusion lung scan for pulmonary embolism.   Original Report Authenticated By: Rudie Meyer, M.D.    Dg Abd 2 Views  11/17/2012  *RADIOLOGY REPORT*  Clinical Data: Abdominal pain and distention  ABDOMEN - 2 VIEW  Comparison: CT abdomen 01/13/2012  Findings: Negative for bowel obstruction.  Surgical bowel clips in the right abdomen.  There is stool in the right and left colon.  No free air on the decubitus view.  Bilateral iliac artery stents.  IMPRESSION: Constipation.  Negative for bowel obstruction.   Original Report Authenticated By: Janeece Riggers, M.D.      Microbiology: No results found for this or any previous visit (from the past 240 hour(s)).   Labs: Basic Metabolic Panel: No results found for this basename: NA, K, CL, CO2, GLUCOSE, BUN, CREATININE, CALCIUM, MG, PHOS,  in the last 168 hours Liver Function Tests: No results found for this basename: AST, ALT, ALKPHOS, BILITOT, PROT, ALBUMIN,  in the last 168 hours No results found for this basename: LIPASE, AMYLASE,  in the last 168 hours No results found for this basename: AMMONIA,  in the last 168 hours CBC: No results found for this basename: WBC, NEUTROABS, HGB, HCT, MCV, PLT,  in the last 168 hours Cardiac Enzymes: No results found for this basename: CKTOTAL, CKMB, CKMBINDEX, TROPONINI,  in the last 168 hours BNP: BNP (last 3 results)  Recent Labs  06/18/12 2107 11/15/12 1710  PROBNP 847.9* 210.2*   CBG: No results found for this basename: GLUCAP,  in the last 168 hours     Signed:  Sharine Cadle  [facility] 11/28/2012, 3:07 PM

## 2012-12-03 ENCOUNTER — Ambulatory Visit
Admission: RE | Admit: 2012-12-03 | Discharge: 2012-12-03 | Disposition: A | Discharge status: Home / Self Care | Payer: PRIVATE HEALTH INSURANCE | Source: Ambulatory Visit | Attending: Internal Medicine | Admitting: Internal Medicine

## 2012-12-03 DIAGNOSIS — Z1231 Encounter for screening mammogram for malignant neoplasm of breast: Secondary | ICD-10-CM

## 2013-03-11 ENCOUNTER — Ambulatory Visit: Payer: PRIVATE HEALTH INSURANCE | Admitting: Cardiology

## 2013-03-13 ENCOUNTER — Ambulatory Visit (INDEPENDENT_AMBULATORY_CARE_PROVIDER_SITE_OTHER): Payer: PRIVATE HEALTH INSURANCE | Admitting: Cardiology

## 2013-03-13 ENCOUNTER — Encounter: Payer: Self-pay | Admitting: Cardiology

## 2013-03-13 VITALS — BP 102/60 | HR 86 | Ht 60.0 in | Wt 144.0 lb

## 2013-03-13 DIAGNOSIS — N183 Chronic kidney disease, stage 3 unspecified: Secondary | ICD-10-CM

## 2013-03-13 DIAGNOSIS — E118 Type 2 diabetes mellitus with unspecified complications: Secondary | ICD-10-CM

## 2013-03-13 DIAGNOSIS — I739 Peripheral vascular disease, unspecified: Secondary | ICD-10-CM

## 2013-03-13 DIAGNOSIS — E669 Obesity, unspecified: Secondary | ICD-10-CM | POA: Insufficient documentation

## 2013-03-13 NOTE — Assessment & Plan Note (Signed)
Bilat LE "burning" when walking to her mailbox. ABI's abnormal in Feb 2014

## 2013-03-13 NOTE — Progress Notes (Signed)
03/13/2013 [name]   [birth year]  [id number]  Primary Physicia Dorrene German, MD Primary Cardiologist: Dr Allyson Sabal  HPI:  This is a 67 y.o. female with a past medical history significant for PVD, DM, COPD, GI bleed in 2011. She had a Myoview in 2012 that was low risk. I could find no record of documented CAD. She has had prior PV intervention with bilateral ilia PTA and stenting in 2009. She was admitted 11/15/12 with complaints of sharp, localized chest pain under her Lt breast. Ultimately we felt this was secondary to T spine compression fracture.           She presents today with complaints of bilateral leg "burning" when she walks to her mailbox. It is relieved with rest. Her recent ABIs from Feb 2014 did show a decrease in her ABIs from Aug 2013.     Current Outpatient Prescriptions  Medication Sig Dispense Refill  . albuterol (PROVENTIL HFA;VENTOLIN HFA) 108 (90 BASE) MCG/ACT inhaler Inhale 2 puffs into the lungs every 6 (six) hours as needed. For shortness of breath      . Alcohol Swabs (ALCOHOL PREP) 70 % PADS       . aspirin 81 MG EC tablet Take 81 mg by mouth daily.        . cilostazol (PLETAL) 100 MG tablet Take 50 mg by mouth 2 (two) times daily before a meal.       . diclofenac sodium (VOLTAREN) 1 % GEL Apply 1 application topically daily as needed. For leg pain      . diphenhydrAMINE (BENADRYL) 25 mg capsule Take 25 mg by mouth at bedtime as needed. For cough      . ergocalciferol (VITAMIN D2) 50000 UNITS capsule Take 50,000 Units by mouth once a week. On Monday      . fluticasone (FLONASE) 50 MCG/ACT nasal spray Place 2 sprays into the nose daily. 50 mcg /act 2 sprays nasal once a day       . folic acid (FOLVITE) 1 MG tablet Take 1 mg by mouth daily.      . furosemide (LASIX) 40 MG tablet Take 1 tablet (40 mg total) by mouth daily.  30 tablet  0  . glimepiride (AMARYL) 4 MG tablet Take 4 mg by mouth Daily.       Marland Kitchen glucose blood test strip 1 each by Other route as needed.  Use as instructed      . Hydrocodone-Acetaminophen 5-300 MG TABS Take 1 tablet by mouth 2 (two) times daily as needed. For pain      . insulin glargine (LANTUS SOLOSTAR) 100 UNIT/ML injection Inject 8 Units into the skin at bedtime.       . Lancets (ACCU-CHEK MULTICLIX) lancets as needed.      . lidocaine (LIDODERM) 5 % Place 1 patch onto the skin daily. Remove & Discard patch within 12 hours or as directed by MD       . lisinopril (PRINIVIL,ZESTRIL) 5 MG tablet Take 1 tablet (5 mg total) by mouth daily.  30 tablet  0  . methocarbamol (ROBAXIN) 500 MG tablet       . metoprolol (LOPRESSOR) 50 MG tablet Take 0.5 tablets (25 mg total) by mouth 2 (two) times daily.  60 tablet  0  . montelukast (SINGULAIR) 10 MG tablet Take 10 mg by mouth at bedtime.        . Nebulizer MISC by Does not apply route at bedtime as needed.      Marland Kitchen  omeprazole (PRILOSEC) 40 MG capsule Take 40 mg by mouth 2 (two) times daily.       Docia Barrier IN Inhale into the lungs. 2 liters nightly      . potassium chloride (K-DUR,KLOR-CON) 10 MEQ tablet       . sitaGLIPtan (JANUVIA) 50 MG tablet Take 50 mg by mouth daily.        . temazepam (RESTORIL) 30 MG capsule Take 30 mg by mouth at bedtime as needed. For sleep      . triamcinolone cream (KENALOG) 0.5 % Apply 1 application topically daily as needed. For legs       No current facility-administered medications for this visit.    Allergies  Allergen Reactions  . Penicillins Hives    History   Social History  . Marital Status: Single    Spouse Name: N/A    Number of Children: N/A  . Years of Education: N/A   Occupational History  . Not on file.   Social History Main Topics  . Smoking status: Former Smoker    Quit date: 11/07/2006  . Smokeless tobacco: Not on file  . Alcohol Use: No     Comment: stopped 2000  . Drug Use: No  . Sexually Active: Not on file   Other Topics Concern  . Not on file   Social History Narrative  . No narrative on file      Review of Systems: General: negative for chills, fever, night sweats or weight changes.  Cardiovascular: negative for chest pain, dyspnea on exertion, edema, orthopnea, palpitations, paroxysmal nocturnal dyspnea or shortness of breath Dermatological: negative for rash Respiratory: negative for cough or wheezing Urologic: negative for hematuria Abdominal: negative for nausea, vomiting, diarrhea, bright red blood per rectum, melena, or hematemesis Neurologic: negative for visual changes, syncope, or dizziness All other systems reviewed and are otherwise negative except as noted above.    Blood pressure 102/60, pulse 86, height 5' (1.524 m), weight 144 lb (65.318 kg).  General appearance: alert, cooperative, no distress and moderately obese Neck: no carotid bruit and no JVD Lungs: clear to auscultation bilaterally Heart: regular rate and rhythm Abdomen: obese Extremities: Tracce edema, venous varicosities, dcreased pulses bilat as well as her femoral arteries.  EKG  EKG: normal EKG, normal sinus rhythm, unchanged from previous tracings.  ASSESSMENT AND PLAN:   Claudication Bilat LE "burning" when walking to her mailbox. ABI's abnormal in Feb 2014  Controlled diabetes mellitus type 2 with complications .  PVD, LEIA/LIIA and RCIA pta 7/09- ABIs 0.68 and 0.69 Feb 2014 .  CKD (chronic kidney disease), stage III Last SCr  1.51 March 2014  Obesity .   PLAN  I will arrange follow up with Dr Allyson Sabal. I did not re order dopplers as they were just done in February. Will check BMP   [facility] 03/13/2013 3:34 PM

## 2013-03-13 NOTE — Patient Instructions (Signed)
Lab today. Follow up with Dr Allyson Sabal

## 2013-03-13 NOTE — Assessment & Plan Note (Signed)
Last SCr  1.51 March 2014

## 2013-03-14 LAB — BASIC METABOLIC PANEL
BUN: 28 mg/dL — ABNORMAL HIGH (ref 6–23)
CO2: 23 mEq/L (ref 19–32)
Calcium: 10.1 mg/dL (ref 8.4–10.5)
Chloride: 106 mEq/L (ref 96–112)
Creat: 1.58 mg/dL — ABNORMAL HIGH (ref 0.50–1.10)
Glucose, Bld: 183 mg/dL — ABNORMAL HIGH (ref 70–99)
Potassium: 4.7 mEq/L (ref 3.5–5.3)
Sodium: 141 mEq/L (ref 135–145)

## 2013-03-21 ENCOUNTER — Encounter: Payer: Self-pay | Admitting: Cardiology

## 2013-03-21 ENCOUNTER — Encounter: Payer: Self-pay | Admitting: Cardiovascular Disease

## 2013-03-21 ENCOUNTER — Ambulatory Visit (INDEPENDENT_AMBULATORY_CARE_PROVIDER_SITE_OTHER): Payer: PRIVATE HEALTH INSURANCE | Admitting: Cardiovascular Disease

## 2013-03-21 VITALS — BP 104/68 | HR 84 | Ht 60.0 in | Wt 143.4 lb

## 2013-03-21 DIAGNOSIS — I739 Peripheral vascular disease, unspecified: Secondary | ICD-10-CM

## 2013-03-21 NOTE — Assessment & Plan Note (Signed)
Patient had bilateral iliac stenting in the past by Dr. Nadara Eaton This was performed 03/10/08.her right SFA was documented to be occluded in its midportion. Recent Dopplers performed 10/25/12 revealed patent stent in her iliac arteries bilaterally with an occluded right SFA that was previously known. She doesn't have claudication as well as knee pain.

## 2013-03-21 NOTE — Progress Notes (Signed)
03/21/2013 [name]   [birth year]  [id number]  Primary Physician Dorrene German, MD Primary Cardiologist: Runell Gess MD [name]   HPI:  The patient is a 67 year old mildly overweight single African American female with no children, who I last saw 6 months ago. She has a history of remote alcohol and tobacco abuse, having quit 6 to 7 years ago. She smoked 50 pack years prior to that and does have COPD. She had PVOD, status post bilateral iliac artery PTA and stenting back in 2009 with a known short-segment occlusion from mid right SFA. She does get some calf claudication, but denies chest pain or shortness of breath. She does have chronic renal insufficiency and chronic pancreatitis as well as alcoholic hepatitis and type 2 diabetes. Her last Myoview, performed March 18, 2011, was not ischemic. Her most recent arterial Dopplers, performed April 16, 2012, revealed ABIs of 0.77 bilaterally with patent iliac stents and short-segment occlusion, mid right SFA, unchanged from prior Doppler study. Her last lipid profile performed in October revealed a total cholesterol of 123, HDL of 64, and HDL of 31. She saw Corine Shelter, PA-C in the office a week ago with similar complaints. She denies chest pain or shortness of breath.     Current Outpatient Prescriptions  Medication Sig Dispense Refill  . albuterol (PROVENTIL HFA;VENTOLIN HFA) 108 (90 BASE) MCG/ACT inhaler Inhale 2 puffs into the lungs every 6 (six) hours as needed. For shortness of breath      . Alcohol Swabs (ALCOHOL PREP) 70 % PADS       . aspirin 81 MG EC tablet Take 81 mg by mouth daily.        . cilostazol (PLETAL) 100 MG tablet Take 50 mg by mouth 2 (two) times daily before a meal.       . diclofenac sodium (VOLTAREN) 1 % GEL Apply 1 application topically daily as needed. For leg pain      . diphenhydrAMINE (BENADRYL) 25 mg capsule Take 25 mg by mouth at bedtime as needed. For cough      . ergocalciferol  (VITAMIN D2) 50000 UNITS capsule Take 50,000 Units by mouth once a week. On Monday      . fluticasone (FLONASE) 50 MCG/ACT nasal spray Place 2 sprays into the nose daily. 50 mcg /act 2 sprays nasal once a day       . folic acid (FOLVITE) 1 MG tablet Take 1 mg by mouth daily.      . furosemide (LASIX) 40 MG tablet Take 1 tablet (40 mg total) by mouth daily.  30 tablet  0  . glimepiride (AMARYL) 4 MG tablet Take 4 mg by mouth Daily.       Marland Kitchen glucose blood test strip 1 each by Other route as needed. Use as instructed      . Hydrocodone-Acetaminophen 5-300 MG TABS Take 1 tablet by mouth 2 (two) times daily as needed. For pain      . insulin glargine (LANTUS SOLOSTAR) 100 UNIT/ML injection Inject 8 Units into the skin at bedtime.       . Lancets (ACCU-CHEK MULTICLIX) lancets as needed.      . lidocaine (LIDODERM) 5 % Place 1 patch onto the skin daily. Remove & Discard patch within 12 hours or as directed by MD       . lisinopril (PRINIVIL,ZESTRIL) 5 MG tablet Take 1 tablet (5 mg total) by mouth daily.  30 tablet  0  . methocarbamol (ROBAXIN) 500  MG tablet       . metoprolol (LOPRESSOR) 50 MG tablet Take 0.5 tablets (25 mg total) by mouth 2 (two) times daily.  60 tablet  0  . montelukast (SINGULAIR) 10 MG tablet Take 10 mg by mouth at bedtime.        . Nebulizer MISC by Does not apply route at bedtime as needed.      Marland Kitchen omeprazole (PRILOSEC) 40 MG capsule Take 40 mg by mouth 2 (two) times daily.       Docia Barrier IN Inhale into the lungs. 2 liters nightly      . potassium chloride (K-DUR,KLOR-CON) 10 MEQ tablet       . sitaGLIPtan (JANUVIA) 50 MG tablet Take 50 mg by mouth daily.        . temazepam (RESTORIL) 30 MG capsule Take 30 mg by mouth at bedtime as needed. For sleep      . triamcinolone cream (KENALOG) 0.5 % Apply 1 application topically daily as needed. For legs      . oxyCODONE (OXY IR/ROXICODONE) 5 MG immediate release tablet as needed.       No current facility-administered medications  for this visit.    Allergies  Allergen Reactions  . Penicillins Hives    History   Social History  . Marital Status: Single    Spouse Name: N/A    Number of Children: N/A  . Years of Education: N/A   Occupational History  . Not on file.   Social History Main Topics  . Smoking status: Former Smoker    Quit date: 11/07/2006  . Smokeless tobacco: Not on file  . Alcohol Use: No     Comment: stopped 2000  . Drug Use: No  . Sexually Active: Not on file   Other Topics Concern  . Not on file   Social History Narrative  . No narrative on file     Review of Systems: General: negative for chills, fever, night sweats or weight changes.  Cardiovascular: negative for chest pain, dyspnea on exertion, edema, orthopnea, palpitations, paroxysmal nocturnal dyspnea or shortness of breath Dermatological: negative for rash Respiratory: negative for cough or wheezing Urologic: negative for hematuria Abdominal: negative for nausea, vomiting, diarrhea, bright red blood per rectum, melena, or hematemesis Neurologic: negative for visual changes, syncope, or dizziness All other systems reviewed and are otherwise negative except as noted above.    Blood pressure 104/68, pulse 84, height 5' (1.524 m), weight 143 lb 6.4 oz (65.046 kg).  General appearance: alert and no distress Neck: no adenopathy, no carotid bruit, no JVD, supple, symmetrical, trachea midline and thyroid not enlarged, symmetric, no tenderness/mass/nodules Lungs: clear to auscultation bilaterally Heart: regular rate and rhythm, S1, S2 normal, no murmur, click, rub or gallop Extremities: extremities normal, atraumatic, no cyanosis or edema  EKG not performed today  ASSESSMENT AND PLAN:   Claudication Patient had bilateral iliac stenting in the past by Dr. Nadara Eaton This was performed 03/10/08.her right SFA was documented to be occluded in its midportion. Recent Dopplers performed 10/25/12 revealed patent stent in her iliac  arteries bilaterally with an occluded right SFA that was previously known. She doesn't have claudication as well as knee pain.      Runell Gess MD FACP,FACC,FAHA, [facility] 03/21/2013 4:07 PM

## 2013-03-21 NOTE — Patient Instructions (Addendum)
Your physician recommends that you schedule a follow-up appointment in: 6 month with luke  Your physician recommends that you schedule a follow-up appointment in: 1 year with Dr Allyson Sabal

## 2013-04-09 ENCOUNTER — Other Ambulatory Visit: Payer: Self-pay | Admitting: *Deleted

## 2013-04-09 MED ORDER — METOPROLOL TARTRATE 50 MG PO TABS: 25.0000 mg | ORAL_TABLET | Freq: Two times a day (BID) | ORAL | Status: DC

## 2013-04-09 NOTE — Telephone Encounter (Signed)
Rx was sent to pharmacy electronically. 

## 2013-05-20 ENCOUNTER — Ambulatory Visit (INDEPENDENT_AMBULATORY_CARE_PROVIDER_SITE_OTHER): Payer: PRIVATE HEALTH INSURANCE | Admitting: Cardiology

## 2013-05-20 ENCOUNTER — Ambulatory Visit (HOSPITAL_COMMUNITY)
Admission: RE | Admit: 2013-05-20 | Discharge: 2013-05-20 | Disposition: A | Discharge status: Home / Self Care | Payer: PRIVATE HEALTH INSURANCE | Source: Ambulatory Visit | Attending: Cardiology | Admitting: Cardiology

## 2013-05-20 ENCOUNTER — Encounter: Payer: Self-pay | Admitting: Cardiology

## 2013-05-20 VITALS — BP 118/80 | HR 84 | Ht 60.0 in | Wt 149.3 lb

## 2013-05-20 DIAGNOSIS — M79604 Pain in right leg: Secondary | ICD-10-CM

## 2013-05-20 DIAGNOSIS — M79609 Pain in unspecified limb: Secondary | ICD-10-CM | POA: Insufficient documentation

## 2013-05-20 DIAGNOSIS — I739 Peripheral vascular disease, unspecified: Secondary | ICD-10-CM

## 2013-05-20 DIAGNOSIS — R0789 Other chest pain: Secondary | ICD-10-CM

## 2013-05-20 DIAGNOSIS — M25561 Pain in right knee: Secondary | ICD-10-CM

## 2013-05-20 DIAGNOSIS — M25569 Pain in unspecified knee: Secondary | ICD-10-CM | POA: Insufficient documentation

## 2013-05-20 LAB — CBC WITH DIFFERENTIAL/PLATELET
Basophils Absolute: 0 10*3/uL (ref 0.0–0.1)
Basophils Relative: 0 % (ref 0–1)
Eosinophils Absolute: 0.2 10*3/uL (ref 0.0–0.7)
Hemoglobin: 11.7 g/dL — ABNORMAL LOW (ref 12.0–15.0)
MCH: 25.2 pg — ABNORMAL LOW (ref 26.0–34.0)
MCHC: 32.5 g/dL (ref 30.0–36.0)
Monocytes Relative: 8 % (ref 3–12)
Neutrophils Relative %: 65 % (ref 43–77)
RDW: 15.6 % — ABNORMAL HIGH (ref 11.5–15.5)

## 2013-05-20 LAB — URIC ACID: Uric Acid, Serum: 8.2 mg/dL — ABNORMAL HIGH (ref 2.4–7.0)

## 2013-05-20 NOTE — Assessment & Plan Note (Addendum)
Pain in Rt. Knee, last pm associated with fever of the knee.  + swelling.  She denies this is her gout.  Check venous doppler and r/o baker's cyst.  If negative I will order labs, uric acid, sed rate and CBC with diff.  She will need ortho consult.  Venous doppler negative for DVT, no Baker's cyst.  Check xray.

## 2013-05-20 NOTE — Progress Notes (Signed)
05/20/2013   PCP: Dorrene German, MD   Chief Complaint  Patient presents with  . Follow-up    Pain behind right knee off and on for @ 2 weeks. No chest pain. Occas. SOB    Primary Cardiologist: Dr. Allyson Sabal  HPI: 67 year old mildly overweight single African American female with no children, who is followed by Dr. Allyson Sabal. She has a history of remote alcohol and tobacco abuse, having quit 6 to 7 years ago. She smoked 50 pack years prior to that and does have COPD. She had PVOD, status post bilateral iliac artery PTA and stenting back in 2009 with a known short-segment occlusion from mid right SFA. She does get some calf claudication, but denies chest pain or shortness of breath. She does have chronic renal insufficiency and chronic pancreatitis as well as alcoholic hepatitis and type 2 diabetes. Her last Myoview, performed March 18, 2011, was not ischemic. Her most recent arterial Dopplers, performed April 16, 2012, revealed ABIs of 0.77 bilaterally with patent iliac stents and short-segment occlusion, mid right SFA, unchanged from prior Doppler study. Most recent doppler 09/2012 with ABIs  Rt 0.68 Lt 0.69. Her last lipid profile performed in October revealed a total cholesterol of 123, HDL of 64, and HDL of 31.  She is an add on today for pain behind Rt knee.  It began 2 weeks ago and last pm she tells me it was feverish, the knee.She denies that it is gout, which she has had before. Pain to stand, pain to bend.  No claudication, the claudication has improved since she last saw Dr. Allyson Sabal.  She has been using Voltaren gel without relief.  She has trouble walking on the knee and she has had recent falls. No syncope per the pt but maybe her rugs, so she added rubber mats underneath.  She is not sure if the knee just gave way.   Allergies  Allergen Reactions  . Penicillins Hives    Current Outpatient Prescriptions  Medication Sig Dispense Refill  . albuterol (PROVENTIL HFA;VENTOLIN HFA)  108 (90 BASE) MCG/ACT inhaler Inhale 2 puffs into the lungs every 6 (six) hours as needed. For shortness of breath      . Alcohol Swabs (ALCOHOL PREP) 70 % PADS       . aspirin 81 MG EC tablet Take 81 mg by mouth daily.        . cilostazol (PLETAL) 100 MG tablet Take 50 mg by mouth 2 (two) times daily before a meal.       . diclofenac sodium (VOLTAREN) 1 % GEL Apply 1 application topically daily as needed. For leg pain      . diphenhydrAMINE (BENADRYL) 25 mg capsule Take 25 mg by mouth at bedtime as needed. For cough      . ergocalciferol (VITAMIN D2) 50000 UNITS capsule Take 50,000 Units by mouth once a week. On Monday      . fluticasone (FLONASE) 50 MCG/ACT nasal spray Place 2 sprays into the nose daily. 50 mcg /act 2 sprays nasal once a day       . folic acid (FOLVITE) 1 MG tablet Take 1 mg by mouth daily.      . furosemide (LASIX) 40 MG tablet Take 1 tablet (40 mg total) by mouth daily.  30 tablet  0  . glimepiride (AMARYL) 4 MG tablet Take 4 mg by mouth Daily.       Marland Kitchen glucose blood test strip 1 each by Other  route as needed. Use as instructed      . Hydrocodone-Acetaminophen 5-300 MG TABS Take 1 tablet by mouth 2 (two) times daily as needed. For pain      . insulin glargine (LANTUS SOLOSTAR) 100 UNIT/ML injection Inject 8 Units into the skin at bedtime.       . Lancets (ACCU-CHEK MULTICLIX) lancets as needed.      . lidocaine (LIDODERM) 5 % Place 1 patch onto the skin daily. Remove & Discard patch within 12 hours or as directed by MD       . lisinopril (PRINIVIL,ZESTRIL) 5 MG tablet Take 1 tablet (5 mg total) by mouth daily.  30 tablet  0  . Menthol, Topical Analgesic, 1 % AERP Apply 1.25 % topically 3 (three) times daily as needed (cream).      . methocarbamol (ROBAXIN) 500 MG tablet Take 500 mg by mouth as needed.       . metoprolol (LOPRESSOR) 50 MG tablet Take 0.5 tablets (25 mg total) by mouth 2 (two) times daily.  180 tablet  0  . montelukast (SINGULAIR) 10 MG tablet Take 10 mg by  mouth at bedtime.        . Nebulizer MISC by Does not apply route at bedtime as needed.      Marland Kitchen omeprazole (PRILOSEC) 40 MG capsule Take 40 mg by mouth 2 (two) times daily.       Marland Kitchen oxyCODONE (OXY IR/ROXICODONE) 5 MG immediate release tablet as needed.      Docia Barrier IN Inhale into the lungs. 2 liters nightly      . potassium chloride (K-DUR,KLOR-CON) 10 MEQ tablet Take 10 mEq by mouth 2 (two) times daily.       . sitaGLIPtan (JANUVIA) 50 MG tablet Take 50 mg by mouth daily.        . temazepam (RESTORIL) 30 MG capsule Take 30 mg by mouth at bedtime as needed. For sleep      . triamcinolone cream (KENALOG) 0.5 % Apply 1 application topically daily as needed. For legs       No current facility-administered medications for this visit.    Past Medical History  Diagnosis Date  . Alcoholic cirrhosis   . Reflux esophagitis   . Gastroesophageal reflux   . Colon polyps   . CHF (congestive heart failure) 10/13    grade 1 diastolic dysfunction, Nl LVF  . Hypertension   . History of GI diverticular bleed   . Pneumonia     history of  . Asthma   . Anemia   . Blood transfusion   . Villous adenoma of colon 05/10/11  . PVD (peripheral vascular disease) 2009    bilat iliac stenting  . Chronic obstructive pulmonary disease (COPD)   . Anxiety   . Arthritis   . Depression   . Glaucoma   . Gout   . Acute venous embolism and thrombosis of deep vessels of proximal lower extremity   . Back pain   . Leg swelling   . Shortness of breath   . Acquired cyst of kidney   . Sleep apnea     no study done .  Marland Kitchen Diabetes mellitus     insulin dep   . Hepatitis     alcoholic hepatitis    Past Surgical History  Procedure Laterality Date  . Abdominal hysterectomy    . Cataract extraction    . Colonoscopy    . Laparoscopic assissted total colectomy w/ j-pouch  04/22/11  .  Ventral hernia repair  06/13/2012    Procedure: LAPAROSCOPIC VENTRAL HERNIA;  Surgeon: Ernestene Mention, MD;  Location: [facility] OR;   Service: General;  Laterality: N/A;  Laparoscopic Assisted Ventral Hernia with Mesh    ZOX:WRUEAVW:UJ colds or fevers, no weight changes Skin:no rashes or ulcers HEENT:no blurred vision, no congestion CV:see HPI PUL:see HPI GI:no diarrhea constipation or melena, no indigestion GU:no hematuria, no dysuria MS:+ Rt. Knee pain, no claudication to mild Neuro:no syncope, no lightheadedness Endo:+ diabetes, no thyroid disease  PHYSICAL EXAM BP 118/80  Pulse 84  Ht 5' (1.524 m)  Wt 149 lb 4.8 oz (67.722 kg)  BMI 29.16 kg/m2 General:Pleasant affect, NAD Skin:Warm and dry, brisk capillary refill HEENT:normocephalic, sclera clear, mucus membranes moist Neck:supple, no JVD, no bruits  Heart:S1S2 RRR without murmur, gallup, rub or click Lungs:clear without rales, rhonchi, or wheezes WJX:BJYN, non tender, + BS, do not palpate liver spleen or masses Ext:no to trace lower ext edema rt > lt, lt knee no edema no tenderness.  Rt. Knee with swelling on medial aspect and post knee. + pain with flexion and extension.  2+ radial pulses Neuro:alert and oriented, MAE, follows commands, + facial symmetry   ASSESSMENT AND PLAN Knee pain, acute Pain in Rt. Knee, last pm associated with fever of the knee.  + swelling.  She denies this is her gout.  Check venous doppler and r/o baker's cyst.  If negative I will order labs, uric acid, sed rate and CBC with diff.  She will need ortho consult.    PVD, LEIA/LIIA and RCIA pta 7/09- ABIs 0.68 and 0.69 Feb 2014 She does have PAD, but this pain is localized to her Rt knee, increased with bending/flexing the knee.  No true claudication.  Atypical chest pain- no history of CAD. Myoview low risk 2012 No chest pain.  No SOB   I have ordered Rt knee films ? trauma with her falls .  Will arrange follow up once lab results and xray results back.

## 2013-05-20 NOTE — Assessment & Plan Note (Signed)
She does have PAD, but this pain is localized to her Rt knee, increased with bending/flexing the knee.  No true claudication.

## 2013-05-20 NOTE — Assessment & Plan Note (Signed)
No chest pain.  No SOB

## 2013-05-20 NOTE — Progress Notes (Signed)
Right Lower Ext. Venous Duplex Completed. Negative for DVT. Sanaz Scarlett, BS, RDMS, RVT  

## 2013-05-20 NOTE — Patient Instructions (Addendum)
Ice the knee 30 min three times a day.   Continue with your voltren gel.  Have blood work done, have X-ray done.  Follow up depending on results of above.

## 2013-05-21 ENCOUNTER — Ambulatory Visit
Admission: RE | Admit: 2013-05-21 | Discharge: 2013-05-21 | Disposition: A | Discharge status: Home / Self Care | Payer: PRIVATE HEALTH INSURANCE | Source: Ambulatory Visit | Attending: Cardiology | Admitting: Cardiology

## 2013-05-21 DIAGNOSIS — M25561 Pain in right knee: Secondary | ICD-10-CM

## 2013-05-27 ENCOUNTER — Ambulatory Visit: Payer: PRIVATE HEALTH INSURANCE | Admitting: Physician Assistant

## 2013-05-29 ENCOUNTER — Telehealth: Payer: Self-pay | Admitting: Cardiology

## 2013-05-29 MED ORDER — ALLOPURINOL 100 MG PO TABS: 100.0000 mg | ORAL_TABLET | Freq: Two times a day (BID) | ORAL | Status: DC

## 2013-05-29 NOTE — Telephone Encounter (Signed)
Message forwarded to L. Ingold, NP. 

## 2013-05-29 NOTE — Telephone Encounter (Signed)
Spoke with patient and gave results. 

## 2013-05-29 NOTE — Telephone Encounter (Signed)
Message copied by Marella Bile on Wed May 29, 2013  1:10 PM ------      Message from: Leone Brand      Created: Tue May 21, 2013 12:01 PM       Please have pt see ortho.  Her pain is in her rt knee.  Her uric acid is elevated so this may be gout. We could try allopurinol 100 mg BID for now and if no improvement see ortho. ------

## 2013-05-29 NOTE — Telephone Encounter (Signed)
Returning  A call from Vernona Rieger from  Sunday or Monday.

## 2013-05-29 NOTE — Telephone Encounter (Signed)
Pt returning call- forwarding to Samara Deist, she left message.

## 2013-06-07 ENCOUNTER — Encounter: Payer: Self-pay | Admitting: *Deleted

## 2013-07-17 ENCOUNTER — Encounter (HOSPITAL_COMMUNITY): Payer: Self-pay | Admitting: *Deleted

## 2013-07-17 ENCOUNTER — Encounter (HOSPITAL_COMMUNITY): Payer: Self-pay | Admitting: Pharmacy Technician

## 2013-08-05 ENCOUNTER — Other Ambulatory Visit: Payer: Self-pay | Admitting: Gastroenterology

## 2013-08-06 ENCOUNTER — Encounter (HOSPITAL_COMMUNITY)
Admission: RE | Disposition: A | Discharge status: Home / Self Care | Payer: Self-pay | Source: Ambulatory Visit | Attending: Gastroenterology

## 2013-08-06 ENCOUNTER — Ambulatory Visit (HOSPITAL_COMMUNITY): Payer: PRIVATE HEALTH INSURANCE | Admitting: Certified Registered Nurse Anesthetist

## 2013-08-06 ENCOUNTER — Ambulatory Visit (HOSPITAL_COMMUNITY)
Admission: RE | Admit: 2013-08-06 | Discharge: 2013-08-06 | Disposition: A | Discharge status: Home / Self Care | Payer: PRIVATE HEALTH INSURANCE | Source: Ambulatory Visit | Attending: Gastroenterology | Admitting: Gastroenterology

## 2013-08-06 ENCOUNTER — Encounter (HOSPITAL_COMMUNITY): Payer: PRIVATE HEALTH INSURANCE | Admitting: Certified Registered Nurse Anesthetist

## 2013-08-06 ENCOUNTER — Encounter (HOSPITAL_COMMUNITY): Payer: Self-pay

## 2013-08-06 DIAGNOSIS — Z87891 Personal history of nicotine dependence: Secondary | ICD-10-CM | POA: Insufficient documentation

## 2013-08-06 DIAGNOSIS — Z860100 Personal history of colon polyps, unspecified: Secondary | ICD-10-CM | POA: Insufficient documentation

## 2013-08-06 DIAGNOSIS — I739 Peripheral vascular disease, unspecified: Secondary | ICD-10-CM | POA: Insufficient documentation

## 2013-08-06 DIAGNOSIS — Z980 Intestinal bypass and anastomosis status: Secondary | ICD-10-CM | POA: Insufficient documentation

## 2013-08-06 DIAGNOSIS — Z79899 Other long term (current) drug therapy: Secondary | ICD-10-CM | POA: Insufficient documentation

## 2013-08-06 DIAGNOSIS — J4489 Other specified chronic obstructive pulmonary disease: Secondary | ICD-10-CM | POA: Insufficient documentation

## 2013-08-06 DIAGNOSIS — K648 Other hemorrhoids: Secondary | ICD-10-CM | POA: Insufficient documentation

## 2013-08-06 DIAGNOSIS — J449 Chronic obstructive pulmonary disease, unspecified: Secondary | ICD-10-CM | POA: Insufficient documentation

## 2013-08-06 DIAGNOSIS — Z09 Encounter for follow-up examination after completed treatment for conditions other than malignant neoplasm: Secondary | ICD-10-CM | POA: Insufficient documentation

## 2013-08-06 DIAGNOSIS — N189 Chronic kidney disease, unspecified: Secondary | ICD-10-CM | POA: Insufficient documentation

## 2013-08-06 DIAGNOSIS — K7460 Unspecified cirrhosis of liver: Secondary | ICD-10-CM | POA: Insufficient documentation

## 2013-08-06 DIAGNOSIS — E119 Type 2 diabetes mellitus without complications: Secondary | ICD-10-CM | POA: Insufficient documentation

## 2013-08-06 DIAGNOSIS — K5730 Diverticulosis of large intestine without perforation or abscess without bleeding: Secondary | ICD-10-CM | POA: Insufficient documentation

## 2013-08-06 DIAGNOSIS — I129 Hypertensive chronic kidney disease with stage 1 through stage 4 chronic kidney disease, or unspecified chronic kidney disease: Secondary | ICD-10-CM | POA: Insufficient documentation

## 2013-08-06 HISTORY — DX: Dependence on supplemental oxygen: Z99.81

## 2013-08-06 HISTORY — PX: COLONOSCOPY WITH PROPOFOL: SHX5780

## 2013-08-06 HISTORY — PX: ESOPHAGOGASTRODUODENOSCOPY (EGD) WITH PROPOFOL: SHX5813

## 2013-08-06 LAB — GLUCOSE, CAPILLARY: Glucose-Capillary: 90 mg/dL (ref 70–99)

## 2013-08-06 SURGERY — ESOPHAGOGASTRODUODENOSCOPY (EGD) WITH PROPOFOL
Anesthesia: Monitor Anesthesia Care

## 2013-08-06 MED ORDER — ONDANSETRON HCL 4 MG/2ML IJ SOLN
INTRAMUSCULAR | Status: AC
  Filled 2013-08-06: qty 2

## 2013-08-06 MED ORDER — KETAMINE HCL 50 MG/ML IJ SOLN
INTRAMUSCULAR | Status: DC | PRN
  Administered 2013-08-06: 25 mg via INTRAMUSCULAR

## 2013-08-06 MED ORDER — GLYCOPYRROLATE 0.2 MG/ML IJ SOLN
INTRAMUSCULAR | Status: AC
  Filled 2013-08-06: qty 1

## 2013-08-06 MED ORDER — ONDANSETRON HCL 4 MG/2ML IJ SOLN
INTRAMUSCULAR | Status: DC | PRN
  Administered 2013-08-06: 4 mg via INTRAVENOUS

## 2013-08-06 MED ORDER — GLYCOPYRROLATE 0.2 MG/ML IJ SOLN
INTRAMUSCULAR | Status: DC | PRN
  Administered 2013-08-06: 0.2 mg via INTRAVENOUS

## 2013-08-06 MED ORDER — LACTATED RINGERS IV SOLN
INTRAVENOUS | Status: DC
  Administered 2013-08-06: 1000 mL via INTRAVENOUS

## 2013-08-06 MED ORDER — PROPOFOL 10 MG/ML IV BOLUS
INTRAVENOUS | Status: DC | PRN
  Administered 2013-08-06 (×3): 10 mg via INTRAVENOUS

## 2013-08-06 MED ORDER — PROPOFOL 10 MG/ML IV BOLUS
INTRAVENOUS | Status: AC
  Filled 2013-08-06: qty 20

## 2013-08-06 MED ORDER — PROMETHAZINE HCL 25 MG/ML IJ SOLN: 6.2500 mg | INTRAMUSCULAR | Status: DC | PRN

## 2013-08-06 MED ORDER — SODIUM CHLORIDE 0.9 % IV SOLN: INTRAVENOUS | Status: DC

## 2013-08-06 MED ORDER — PROPOFOL INFUSION 10 MG/ML OPTIME
INTRAVENOUS | Status: DC | PRN
  Administered 2013-08-06: 50 ug/kg/min via INTRAVENOUS

## 2013-08-06 SURGICAL SUPPLY — 25 items

## 2013-08-06 NOTE — Anesthesia Preprocedure Evaluation (Signed)
Anesthesia Evaluation  Patient identified by MRN, date of birth, ID band Patient awake    Reviewed: Allergy & Precautions, H&P , NPO status , Patient's Chart, lab work & pertinent test results  Airway Mallampati: II TM Distance: >3 FB Neck ROM: Full    Dental no notable dental hx.    Pulmonary asthma , sleep apnea , former smoker,  breath sounds clear to auscultation  Pulmonary exam normal       Cardiovascular hypertension, Pt. on medications + Peripheral Vascular Disease and +CHF Rhythm:Regular Rate:Normal     Neuro/Psych negative neurological ROS  negative psych ROS   GI/Hepatic negative GI ROS, (+) Cirrhosis -       , Hepatitis -, Toxin Related  Endo/Other  diabetes, Oral Hypoglycemic Agents  Renal/GU Renal InsufficiencyRenal disease  negative genitourinary   Musculoskeletal negative musculoskeletal ROS (+)   Abdominal   Peds negative pediatric ROS (+)  Hematology negative hematology ROS (+)   Anesthesia Other Findings   Reproductive/Obstetrics negative OB ROS                           Anesthesia Physical Anesthesia Plan  ASA: III  Anesthesia Plan: MAC   Post-op Pain Management:    Induction: Intravenous  Airway Management Planned: Nasal Cannula  Additional Equipment:   Intra-op Plan:   Post-operative Plan:   Informed Consent: I have reviewed the patients History and Physical, chart, labs and discussed the procedure including the risks, benefits and alternatives for the proposed anesthesia with the patient or authorized representative who has indicated his/her understanding and acceptance.   Dental advisory given  Plan Discussed with: CRNA and Surgeon  Anesthesia Plan Comments:         Anesthesia Quick Evaluation

## 2013-08-06 NOTE — Op Note (Signed)
[facility] [address] [place], [address]   COLONOSCOPY PROCEDURE REPORT  PATIENT: [name], [name]  MR#: [id number] BIRTHDATE: [birth year] , 67  yrs. old GENDER: Female ENDOSCOPIST: Charlott Rakes, MD REFERRED XB:[id number] Concepcion Elk, M.D. PROCEDURE DATE:  08/06/2013 PROCEDURE:   Colonoscopy, diagnostic ASA CLASS:   Class III INDICATIONS:Patient's personal history of adenomatous colon polyps.  MEDICATIONS: See Anesthesia Report.  DESCRIPTION OF PROCEDURE:   After the risks benefits and alternatives of the procedure were thoroughly explained, informed consent was obtained.  The Pentax Ped Colon [id number]  endoscope was introduced through the anus and advanced to the surgical anastomosis , limited by No adverse events experienced.   The quality of the prep was fair. .  The instrument was then slowly withdrawn as the colon was fully examined.     FINDINGS:  Rectal exam unremarkable.  Pediatric colonoscope inserted into the colon and advanced to the ileocolonic anastomosis.    On careful withdrawal of the colonoscope diffuse left-sided diverticuli were noted.    Retroflexion was attempted but was unsuccessful due to a small rectal vault. On slow withdrawal from the rectum small internal hemorrhoids were noted.  COMPLICATIONS: None  IMPRESSION:     1. Left-sided diverticulosis 2. Internal hemorrhoids 3. Exam to ileocolonic anastomosis  RECOMMENDATIONS: Repeat colonoscopy in 3 years; High fiber diet    ______________________________ eSigned:  Charlott Rakes, MD 08/06/2013 10:10 AM   KG:[id number] Concepcion Elk, MD

## 2013-08-06 NOTE — Addendum Note (Signed)
Addended by: Yuval Nolet on: 08/06/2013 08:15 AM   Modules accepted: Orders  

## 2013-08-06 NOTE — Interval H&P Note (Signed)
History and Physical Interval Note:  08/06/2013 9:12 AM  [name]  has presented today for surgery, with the diagnosis of hx. of polyps/cirrhosis  The various methods of treatment have been discussed with the patient and family. After consideration of risks, benefits and other options for treatment, the patient has consented to  Procedure(s): ESOPHAGOGASTRODUODENOSCOPY (EGD) WITH PROPOFOL (N/A) COLONOSCOPY WITH PROPOFOL (N/A) as a surgical intervention .  The patient's history has been reviewed, patient examined, no change in status, stable for surgery.  I have reviewed the patient's chart and labs.  Questions were answered to the patient's satisfaction.     Leeon Makar C.

## 2013-08-06 NOTE — Transfer of Care (Signed)
Immediate Anesthesia Transfer of Care Note  Patient: [name]  Procedure(s) Performed: Procedure(s) (LRB): ESOPHAGOGASTRODUODENOSCOPY (EGD) WITH PROPOFOL (N/A) COLONOSCOPY WITH PROPOFOL (N/A)  Patient Location: PACU  Anesthesia Type: MAC  Level of Consciousness: sedated, patient cooperative and responds to stimulation  Airway & Oxygen Therapy: Patient Spontanous Breathing and Patient connected to face mask oxgen  Post-op Assessment: Report given to PACU RN and Post -op Vital signs reviewed and stable  Post vital signs: Reviewed and stable  Complications: No apparent anesthesia complications

## 2013-08-06 NOTE — Preoperative (Signed)
Beta Blockers   Reason not to administer Beta Blockers:Not Applicable, took beta blocker 08/07/11

## 2013-08-06 NOTE — Anesthesia Postprocedure Evaluation (Signed)
  Anesthesia Post-op Note  Patient: [name]  Procedure(s) Performed: Procedure(s) (LRB): ESOPHAGOGASTRODUODENOSCOPY (EGD) WITH PROPOFOL (N/A) COLONOSCOPY WITH PROPOFOL (N/A)  Patient Location: PACU  Anesthesia Type: MAC  Level of Consciousness: awake and alert   Airway and Oxygen Therapy: Patient Spontanous Breathing  Post-op Pain: mild  Post-op Assessment: Post-op Vital signs reviewed, Patient's Cardiovascular Status Stable, Respiratory Function Stable, Patent Airway and No signs of Nausea or vomiting  Last Vitals:  Filed Vitals:   08/06/13 1025  BP: 149/90  Pulse:   Temp:   Resp: 12    Post-op Vital Signs: stable   Complications: No apparent anesthesia complications

## 2013-08-06 NOTE — H&P (Signed)
  Date of Initial H&P: 07/30/13  History reviewed, patient examined, no change in status, stable for surgery. 

## 2013-08-06 NOTE — Op Note (Signed)
[facility] [address] [place], [address]   ENDOSCOPY PROCEDURE REPORT  PATIENT: [name], [name]  MR#: [id number] BIRTHDATE: [birth year] , 67  yrs. old GENDER: Female  ENDOSCOPIST: Charlott Rakes, MD REFERRED AO:ZHYQM Concepcion Elk, M.D.  PROCEDURE DATE:  08/06/2013 PROCEDURE:   EGD, diagnostic ASA CLASS:   Class III INDICATIONS:Cirrhosis MEDICATIONS: See Anesthesia Report.  TOPICAL ANESTHETIC:  DESCRIPTION OF PROCEDURE:   After the risks benefits and alternatives of the procedure were thoroughly explained, informed consent was obtained.  The Pentax Gastroscope [id number]  endoscope was introduced through the mouth and advanced to the second portion of the duodenum , limited by Without limitations.   The instrument was slowly withdrawn as the mucosa was fully examined.     FINDINGS: The endoscope was inserted into the oropharynx and esophagus was intubated.  The gastroesophageal junction was noted to be 40 cm from the incisors. NO esophageal varices were seen. Endoscope was advanced into the stomach, which revealed normal appearing gastric mucosa.  The endoscope was advanced to the duodenal bulb which revealed a focal area of subepithelial hemorrhages consistent with mild duodenitis. The second portion of duodenum was examined and was unremarkable.  The endoscope was withdrawn back into the stomach and retroflexion revealed a normal proximal stomach.  COMPLICATIONS: None  ENDOSCOPIC IMPRESSION:     Mild duodenitis o/w normal EGD  RECOMMENDATIONS: Continue PPI; Repeat EGD in 2 years   REPEAT EXAM: N/A  _______________________________ Charlott Rakes, MD eSigned:  Charlott Rakes, MD 08/06/2013 10:15 AM    VH:QIONG Concepcion Elk, MD  PATIENT NAME:  [name], [name] MR#: [id number]

## 2013-08-07 ENCOUNTER — Encounter (HOSPITAL_COMMUNITY): Payer: Self-pay | Admitting: Gastroenterology

## 2013-08-28 ENCOUNTER — Other Ambulatory Visit: Payer: Self-pay | Admitting: Cardiovascular Disease

## 2013-08-28 NOTE — Telephone Encounter (Signed)
Rx was sent to pharmacy electronically. 

## 2013-09-03 ENCOUNTER — Encounter: Payer: Self-pay | Admitting: Cardiovascular Disease

## 2013-09-03 ENCOUNTER — Ambulatory Visit (INDEPENDENT_AMBULATORY_CARE_PROVIDER_SITE_OTHER): Payer: PRIVATE HEALTH INSURANCE | Admitting: Cardiovascular Disease

## 2013-09-03 VITALS — BP 104/70 | HR 88 | Ht 60.0 in | Wt 150.0 lb

## 2013-09-03 DIAGNOSIS — I739 Peripheral vascular disease, unspecified: Secondary | ICD-10-CM

## 2013-09-03 DIAGNOSIS — E118 Type 2 diabetes mellitus with unspecified complications: Secondary | ICD-10-CM

## 2013-09-03 NOTE — Progress Notes (Signed)
09/03/2013 [name]   1946/05/08  [id number]  Primary Physician Philis Fendt, MD Primary Cardiologist: [name] MD Renae Gloss   HPI:  The patient is a 67year-old mildly overweight single [place] American female with no children, who I last saw 6 months ago. She has a history of remote alcohol and tobacco abuse, having quit 6 to 7 years ago. She smoked 50 pack years prior to that and does have COPD. She had PVOD, status post bilateral iliac artery PTA and stenting back in 2009 with a known short-segment occlusion from mid right SFA. She does get some calf claudication, but denies chest pain or shortness of breath. She does have chronic renal insufficiency and chronic pancreatitis as well as alcoholic hepatitis and type 2 diabetes. Her last Myoview, performed March 18, 2011, was not ischemic. Her most recent arterial Dopplers, performed April 16, 2012, revealed ABIs of 0.77 bilaterally with patent iliac stents and short-segment occlusion, mid right SFA, unchanged from prior Doppler study. Her last lipid profile performed in October revealed a total cholesterol of 123, HDL of 64, and HDL of 31. She saw [name], PA-C in the office a week ago with similar complaints. She denies chest pain or shortness of breath.she saw [name] registered nurse practitioner in the office after that with left knee pain. Venous Dopplers did not show evidence of DVT or Baker's cyst. The pain has since resolved.     Current Outpatient Prescriptions  Medication Sig Dispense Refill  . albuterol (PROVENTIL HFA;VENTOLIN HFA) 108 (90 BASE) MCG/ACT inhaler Inhale 2 puffs into the lungs every 6 (six) hours as needed. For shortness of breath      . albuterol (PROVENTIL) (2.5 MG/3ML) 0.083% nebulizer solution Take 2.5 mg by nebulization every 6 (six) hours as needed for wheezing or shortness of breath.      Marland Kitchen aspirin 81 MG EC tablet Take 81 mg by mouth daily.        . cilostazol (PLETAL)  100 MG tablet Take 50 mg by mouth 2 (two) times daily before a meal.       . diclofenac sodium (VOLTAREN) 1 % GEL Apply 1 application topically daily as needed. For leg pain      . diphenhydrAMINE (BENADRYL) 25 mg capsule Take 25 mg by mouth at bedtime as needed. For cough      . ergocalciferol (VITAMIN D2) 50000 UNITS capsule Take 50,000 Units by mouth once a week. On Monday      . fluticasone (FLONASE) 50 MCG/ACT nasal spray Place 2 sprays into the nose daily. 50 mcg /act 2 sprays nasal once a day       . folic acid (FOLVITE) 1 MG tablet Take 1 mg by mouth daily.      . furosemide (LASIX) 40 MG tablet Take 40 mg by mouth every morning.      Marland Kitchen glimepiride (AMARYL) 4 MG tablet Take 4 mg by mouth Daily.       . insulin glargine (LANTUS SOLOSTAR) 100 UNIT/ML injection Inject 10 Units into the skin at bedtime.       . lidocaine (LIDODERM) 5 % Place 1 patch onto the skin daily. Remove & Discard patch within 12 hours or as directed by MD       . methocarbamol (ROBAXIN) 500 MG tablet Take 500 mg by mouth 2 (two) times daily.      . metoprolol (LOPRESSOR) 50 MG tablet TAKE 1/2 TABLET BY MOUTH TWICE DAILY  180 tablet  0  . montelukast (SINGULAIR) 10 MG tablet Take 10 mg by mouth at bedtime.       Marland Kitchen omeprazole (PRILOSEC) 40 MG capsule Take 40 mg by mouth daily.       Marland Kitchen oxyCODONE (OXY IR/ROXICODONE) 5 MG immediate release tablet Take 5 mg by mouth daily as needed for moderate pain.       [name] IN Inhale into the lungs. 2 liters nightly      . Polyethylene Glycol 400 (BLINK TEARS OP) Apply to eye as needed.      . potassium chloride (K-DUR,KLOR-CON) 10 MEQ tablet Take 10 mEq by mouth 2 (two) times daily.       . sitaGLIPtan (JANUVIA) 50 MG tablet Take 50 mg by mouth daily.       . temazepam (RESTORIL) 30 MG capsule Take 30 mg by mouth at bedtime as needed. For sleep      . triamcinolone cream (KENALOG) 0.5 % Apply 1 application topically daily as needed. For legs       No current  facility-administered medications for this visit.    Allergies  Allergen Reactions  . Penicillins Hives    History   Social History  . Marital Status: Single    Spouse Name: N/A    Number of Children: N/A  . Years of Education: N/A   Occupational History  . Not on file.   Social History Main Topics  . Smoking status: Former Smoker -- 0.50 packs/day for 30 years    Types: Cigarettes    Quit date: 11/06/2005  . Smokeless tobacco: Never Used  . Alcohol Use: No     Comment: stopped 2005  . Drug Use: No  . Sexual Activity: Not on file   Other Topics Concern  . Not on file   Social History Narrative  . No narrative on file     Review of Systems: General: negative for chills, fever, night sweats or weight changes.  Cardiovascular: negative for chest pain, dyspnea on exertion, edema, orthopnea, palpitations, paroxysmal nocturnal dyspnea or shortness of breath Dermatological: negative for rash Respiratory: negative for cough or wheezing Urologic: negative for hematuria Abdominal: negative for nausea, vomiting, diarrhea, bright red blood per rectum, melena, or hematemesis Neurologic: negative for visual changes, syncope, or dizziness All other systems reviewed and are otherwise negative except as noted above.    Blood pressure 104/70, pulse 88, height 5' (1.524 m), weight 150 lb (68.04 kg).  General appearance: alert and no distress Neck: no adenopathy, no carotid bruit, no JVD, supple, symmetrical, trachea midline and thyroid not enlarged, symmetric, no tenderness/mass/nodules Lungs: clear to auscultation bilaterally Heart: regular rate and rhythm, S1, S2 normal, no murmur, click, rub or gallop Extremities: extremities normal, atraumatic, no cyanosis or edema  EKG normal sinus rhythm at 88 without ST or T wave changes  ASSESSMENT AND PLAN:   Claudication History of PAD status post bilateral iliac stenting by Dr. Einar Gip she 7/13/009 with a known short segment  occlusion of the right SFA. Her most recent lower extremity Doppler studies performed 10/25/12 revealed a right ABI of 0.6 a total of 2.69. Her iliac stents appeared patent. She did have a total right SFA. She really denies claudication.      [name] MD FACP,FACC,FAHA, [facility] 09/03/2013 3:05 PM

## 2013-09-03 NOTE — Patient Instructions (Signed)
Your physician wants you to follow-up in: 6 months with [name] NP and 1 year with Dr Gwenlyn Found.  You will receive a reminder letter in the mail two months in advance. If you don't receive a letter, please call our office to schedule the follow-up appointment.

## 2013-09-03 NOTE — Assessment & Plan Note (Signed)
History of PAD status post bilateral iliac stenting by Dr. Einar Gip she 7/13/009 with a known short segment occlusion of the right SFA. Her most recent lower extremity Doppler studies performed 10/25/12 revealed a right ABI of 0.6 a total of 2.69. Her iliac stents appeared patent. She did have a total right SFA. She really denies claudication.

## 2013-09-04 ENCOUNTER — Encounter: Payer: Self-pay | Admitting: Cardiovascular Disease

## 2013-09-30 ENCOUNTER — Telehealth (HOSPITAL_COMMUNITY): Payer: Self-pay | Admitting: *Deleted

## 2013-10-01 ENCOUNTER — Other Ambulatory Visit (HOSPITAL_COMMUNITY): Payer: Self-pay | Admitting: Cardiovascular Disease

## 2013-10-01 DIAGNOSIS — I739 Peripheral vascular disease, unspecified: Secondary | ICD-10-CM

## 2013-10-04 ENCOUNTER — Ambulatory Visit (HOSPITAL_COMMUNITY)
Admission: RE | Admit: 2013-10-04 | Discharge: 2013-10-04 | Disposition: A | Discharge status: Home / Self Care | Payer: PRIVATE HEALTH INSURANCE | Source: Ambulatory Visit | Attending: Cardiovascular Disease | Admitting: Cardiovascular Disease

## 2013-10-04 DIAGNOSIS — I739 Peripheral vascular disease, unspecified: Secondary | ICD-10-CM | POA: Insufficient documentation

## 2013-10-04 DIAGNOSIS — I70209 Unspecified atherosclerosis of native arteries of extremities, unspecified extremity: Secondary | ICD-10-CM | POA: Insufficient documentation

## 2013-10-04 NOTE — Progress Notes (Signed)
Lower Extremity Arterial Duplex Completed. °Brianna L Mazza,RVT °

## 2013-10-11 ENCOUNTER — Encounter: Payer: Self-pay | Admitting: *Deleted

## 2013-10-11 ENCOUNTER — Telehealth: Payer: Self-pay | Admitting: *Deleted

## 2013-10-11 DIAGNOSIS — I739 Peripheral vascular disease, unspecified: Secondary | ICD-10-CM

## 2013-10-11 NOTE — Telephone Encounter (Signed)
Order placed for repeat lower extremity arterial doppler in 1 year

## 2013-10-11 NOTE — Telephone Encounter (Signed)
Message copied by Chauncy Lean on Fri Oct 11, 2013  1:00 PM ------      Message from: [name]      Created: Thu Oct 10, 2013  6:40 AM       No change from prior study. Repeat in 12 months. ------

## 2013-10-28 ENCOUNTER — Other Ambulatory Visit: Payer: Self-pay

## 2013-10-28 DIAGNOSIS — Z1231 Encounter for screening mammogram for malignant neoplasm of breast: Secondary | ICD-10-CM

## 2013-12-04 ENCOUNTER — Ambulatory Visit
Admission: RE | Admit: 2013-12-04 | Discharge: 2013-12-04 | Disposition: A | Discharge status: Home / Self Care | Payer: PRIVATE HEALTH INSURANCE | Source: Ambulatory Visit

## 2013-12-04 DIAGNOSIS — Z1231 Encounter for screening mammogram for malignant neoplasm of breast: Secondary | ICD-10-CM

## 2014-01-07 ENCOUNTER — Other Ambulatory Visit: Payer: Self-pay | Admitting: Cardiovascular Disease

## 2014-01-07 NOTE — Telephone Encounter (Signed)
Rx was sent to pharmacy electronically. 

## 2014-03-04 ENCOUNTER — Ambulatory Visit (INDEPENDENT_AMBULATORY_CARE_PROVIDER_SITE_OTHER): Payer: PRIVATE HEALTH INSURANCE | Admitting: Cardiology

## 2014-03-04 ENCOUNTER — Encounter: Payer: Self-pay | Admitting: Cardiology

## 2014-03-04 VITALS — BP 130/80 | HR 85 | Ht 60.0 in | Wt 150.0 lb

## 2014-03-04 DIAGNOSIS — I2510 Atherosclerotic heart disease of native coronary artery without angina pectoris: Secondary | ICD-10-CM

## 2014-03-04 DIAGNOSIS — E782 Mixed hyperlipidemia: Secondary | ICD-10-CM

## 2014-03-04 DIAGNOSIS — I739 Peripheral vascular disease, unspecified: Secondary | ICD-10-CM

## 2014-03-04 DIAGNOSIS — R0789 Other chest pain: Secondary | ICD-10-CM

## 2014-03-04 DIAGNOSIS — J449 Chronic obstructive pulmonary disease, unspecified: Secondary | ICD-10-CM

## 2014-03-04 DIAGNOSIS — E118 Type 2 diabetes mellitus with unspecified complications: Secondary | ICD-10-CM

## 2014-03-04 DIAGNOSIS — Z79899 Other long term (current) drug therapy: Secondary | ICD-10-CM

## 2014-03-04 DIAGNOSIS — I2584 Coronary atherosclerosis due to calcified coronary lesion: Secondary | ICD-10-CM

## 2014-03-04 NOTE — Patient Instructions (Signed)
1. Your physician recommends that you schedule a follow-up appointment in: 6 months with Dr. Gwenlyn Found

## 2014-03-04 NOTE — Assessment & Plan Note (Signed)
Depends on her diet, followed by PCP

## 2014-03-04 NOTE — Progress Notes (Signed)
03/04/2014   PCP: Philis Fendt, MD   Chief Complaint  Patient presents with  . Follow-up    6 month visit    Primary Cardiologist:Dr. Adora Fridge   HPI:  68year-old mildly overweight single [place] American female with no children, who is followed by Dr. Gwenlyn Found. She has a history of remote alcohol and tobacco abuse, having quit 10 years ago. She smoked 50 pack years prior to that and does have COPD on home oxygen prn.. She had PVOD, status post bilateral iliac artery PTA and stenting back in 2009 with a known short-segment occlusion from mid right SFA. She does have occ. calf claudication. She does have chronic renal insufficiency and chronic pancreatitis as well as alcoholic hepatitis and type 2 diabetes. Her last Myoview, performed March 18, 2011, was not ischemic. Her most recent arterial Dopplers, performed Feb, 2015, revealed ABIs of 0.73 on rt and Lt. 0.58 with patent iliac stents and short-segment occlusion, mid right SFA, unchanged from prior Doppler study. Her last lipid profile performed in October 2013 revealed a total cholesterol of 123, HDL of 64, and HDL of 31.  Today patient is back for regular followup.  She denies chest pain or shortness of breath. She wears her oxygen on a prn basis.  Her glucose fluctuates with her diet.  She is somewhat concerned about her cholesterol.    Allergies  Allergen Reactions  . Penicillins Hives    Current Outpatient Prescriptions  Medication Sig Dispense Refill  . albuterol (PROVENTIL HFA;VENTOLIN HFA) 108 (90 BASE) MCG/ACT inhaler Inhale 2 puffs into the lungs every 6 (six) hours as needed. For shortness of breath      . albuterol (PROVENTIL) (2.5 MG/3ML) 0.083% nebulizer solution Take 2.5 mg by nebulization every 6 (six) hours as needed for wheezing or shortness of breath.      Marland Kitchen aspirin 81 MG EC tablet Take 81 mg by mouth daily.        . cilostazol (PLETAL) 100 MG tablet Take 50 mg by mouth 2 (two) times daily before a  meal.       . diclofenac sodium (VOLTAREN) 1 % GEL Apply 1 application topically daily as needed. For leg pain      . diphenhydrAMINE (BENADRYL) 25 mg capsule Take 25 mg by mouth at bedtime as needed. For cough      . ergocalciferol (VITAMIN D2) 50000 UNITS capsule Take 50,000 Units by mouth once a week. On Monday      . fluticasone (FLONASE) 50 MCG/ACT nasal spray Place 2 sprays into the nose daily. 50 mcg /act 2 sprays nasal once a day       . folic acid (FOLVITE) 1 MG tablet Take 1 mg by mouth daily.      . furosemide (LASIX) 40 MG tablet Take 40 mg by mouth every morning.      Marland Kitchen glimepiride (AMARYL) 4 MG tablet Take 4 mg by mouth Daily.       . insulin glargine (LANTUS SOLOSTAR) 100 UNIT/ML injection Inject 10 Units into the skin at bedtime.       . lidocaine (LIDODERM) 5 % Place 1 patch onto the skin daily. Remove & Discard patch within 12 hours or as directed by MD       . methocarbamol (ROBAXIN) 500 MG tablet Take 500 mg by mouth 2 (two) times daily.      . metoprolol (LOPRESSOR) 50 MG tablet TAKE 1/2 TABLET BY MOUTH  TWICE DAILY  180 tablet  1  . montelukast (SINGULAIR) 10 MG tablet Take 10 mg by mouth at bedtime.       Marland Kitchen omeprazole (PRILOSEC) 40 MG capsule Take 40 mg by mouth daily.       Marland Kitchen oxyCODONE (OXY IR/ROXICODONE) 5 MG immediate release tablet Take 5 mg by mouth daily as needed for moderate pain.       [name] IN Inhale into the lungs. 2 liters nightly      . Polyethylene Glycol 400 (BLINK TEARS OP) Apply to eye as needed.      . potassium chloride (K-DUR,KLOR-CON) 10 MEQ tablet Take 10 mEq by mouth 2 (two) times daily.       . sitaGLIPtan (JANUVIA) 50 MG tablet Take 50 mg by mouth daily.       . temazepam (RESTORIL) 30 MG capsule Take 30 mg by mouth at bedtime as needed. For sleep      . triamcinolone cream (KENALOG) 0.5 % Apply 1 application topically daily as needed. For legs       No current facility-administered medications for this visit.    Past Medical History    Diagnosis Date  . Alcoholic cirrhosis   . Reflux esophagitis   . Gastroesophageal reflux   . Colon polyps   . CHF (congestive heart failure) 10/13    grade 1 diastolic dysfunction, Nl LVF  . Hypertension   . History of GI diverticular bleed   . Pneumonia     history of  . Asthma   . Anemia   . Blood transfusion few years ago  . Villous adenoma of colon 05/10/11  . PVD (peripheral vascular disease) 2009    bilat iliac stenting  . Chronic obstructive pulmonary disease (COPD)   . Arthritis   . Glaucoma     left  . Gout   . Acute venous embolism and thrombosis of deep vessels of proximal lower extremity   . Back pain   . Leg swelling   . Diabetes mellitus     insulin dep   . Hepatitis     alcoholic hepatitis  . Depression   . Anxiety   . On home oxygen therapy oxygen 2 liter per minute prn per [place]    Past Surgical History  Procedure Laterality Date  . Cataract extraction Right yrs ago  . Colonoscopy    . Laparoscopic assissted total colectomy w/ j-pouch  04/22/11  . Ventral hernia repair  06/13/2012    Procedure: LAPAROSCOPIC VENTRAL HERNIA;  Surgeon: Adin Hector, MD;  Location: [place];  Service: General;  Laterality: N/A;  Laparoscopic Assisted Ventral Hernia with Mesh  . Abdominal hysterectomy  yrs ago  . Esophagogastroduodenoscopy (egd) with propofol N/A 08/06/2013    Procedure: ESOPHAGOGASTRODUODENOSCOPY (EGD) WITH PROPOFOL;  Surgeon: Lear Ng, MD;  Location: [facility];  Service: Endoscopy;  Laterality: N/A;  . Colonoscopy with propofol N/A 08/06/2013    Procedure: COLONOSCOPY WITH PROPOFOL;  Surgeon: Lear Ng, MD;  Location: [facility];  Service: Endoscopy;  Laterality: N/A;    CBJ:SEGBTDV:VO colds or fevers, no weight changes Skin:no rashes or ulcers HEENT:no blurred vision, no congestion CV:see HPI PUL:see HPI GI:no diarrhea , constipation or melena, no indigestion GU:no hematuria, no dysuria MS:no joint pain, no  claudication Neuro:no syncope, no lightheadedness Endo:+ diabetes- stable mostly, no thyroid disease  Wt Readings from Last 3 Encounters:  03/04/14 150 lb (68.04 kg)  09/03/13 150 lb (68.04 kg)  08/06/13 147 lb (  66.679 kg)   Lipid Panel     Component Value Date/Time   CHOL 123 06/19/2012 0240   TRIG 139 06/19/2012 0240   HDL 31* 06/19/2012 0240   CHOLHDL 4.0 06/19/2012 0240   VLDL 28 06/19/2012 0240   LDLCALC 64 06/19/2012 0240      PHYSICAL EXAM BP 130/80  Pulse 85  Ht 5' (1.524 m)  Wt 150 lb (68.04 kg)  BMI 29.30 kg/m2 General:Pleasant affect, NAD Skin:Warm and dry, brisk capillary refill HEENT:normocephalic, sclera clear, mucus membranes moist Neck:supple, no JVD, no bruits  Heart:S1S2 RRR without murmur, gallup, rub or click Lungs:clear though diminished in the bases without rales, rhonchi, or wheezes AYO:KHTXHFSFS, non tender, + BS, do not palpate liver spleen or masses Ext:tr lt lower ext edema none on the rt.,  2+ radial pulses Neuro:alert and oriented, MAE, follows commands, + facial symmetry  EKG:SR no acute changes from 08/2013  ASSESSMENT AND PLAN PVD, LEIA/LIIA and RCIA pta 7/09- ABIs 0.68 and 0.69 Feb 2014 No specific complaints. LEA dopplers in 09/2013 were stable. Not on statin, wil check lipids, last evaluated 2013  Controlled diabetes mellitus type 2 with complications Depends on her diet, followed by PCP  COPD (chronic obstructive pulmonary disease) Use home O2 as needed.  Atypical chest pain- no history of CAD. Myoview low risk 2012 Normal EKG, no chest pain.

## 2014-03-04 NOTE — Assessment & Plan Note (Addendum)
No specific complaints. LEA dopplers in 09/2013 were stable. Not on statin, wil check lipids, last evaluated 2013

## 2014-03-04 NOTE — Assessment & Plan Note (Signed)
Use home O2 as needed.

## 2014-03-04 NOTE — Assessment & Plan Note (Signed)
Normal EKG, no chest pain.

## 2014-03-06 ENCOUNTER — Other Ambulatory Visit: Payer: Self-pay | Admitting: *Deleted

## 2014-03-06 LAB — HEPATIC FUNCTION PANEL
ALBUMIN: 3.7 g/dL (ref 3.5–5.2)
ALT: 11 U/L (ref 0–35)
AST: 19 U/L (ref 0–37)
Alkaline Phosphatase: 102 U/L (ref 39–117)
BILIRUBIN TOTAL: 0.2 mg/dL (ref 0.2–1.2)
Bilirubin, Direct: <0.1 mg/dL (ref 0.0–0.3)
Total Protein: 6.7 g/dL (ref 6.0–8.3)

## 2014-03-06 LAB — LIPID PANEL
Cholesterol: 150 mg/dL (ref 0–200)
HDL: 30 mg/dL — AB (ref >39)
LDL CALC: 66 mg/dL (ref 0–99)
Total CHOL/HDL Ratio: 5 Ratio
Triglycerides: 272 mg/dL — ABNORMAL HIGH (ref <150)
VLDL: 54 mg/dL — AB (ref 0–40)

## 2014-03-06 MED ORDER — ATORVASTATIN CALCIUM 10 MG PO TABS: 10.0000 mg | ORAL_TABLET | Freq: Every day | ORAL | Status: DC

## 2014-03-06 NOTE — Progress Notes (Signed)
[facility] called and they would follow up on pt's loop recorder

## 2014-03-10 ENCOUNTER — Other Ambulatory Visit: Payer: Self-pay | Admitting: Internal Medicine

## 2014-03-10 DIAGNOSIS — E2839 Other primary ovarian failure: Secondary | ICD-10-CM

## 2014-03-19 ENCOUNTER — Ambulatory Visit
Admission: RE | Admit: 2014-03-19 | Discharge: 2014-03-19 | Disposition: A | Discharge status: Home / Self Care | Payer: PRIVATE HEALTH INSURANCE | Source: Ambulatory Visit | Attending: Internal Medicine | Admitting: Internal Medicine

## 2014-03-19 DIAGNOSIS — E2839 Other primary ovarian failure: Secondary | ICD-10-CM

## 2014-04-24 ENCOUNTER — Telehealth: Payer: Self-pay | Admitting: Cardiology

## 2014-04-24 NOTE — Telephone Encounter (Signed)
I agree stop taking, use Benadryl if needed.  Go to Er if symptoms increase.

## 2014-04-24 NOTE — Telephone Encounter (Signed)
Please call,,concerning her Lipitor.

## 2014-04-24 NOTE — Telephone Encounter (Signed)
Returned call to patient she stated since she started taking lipitor her eyes,legs,feet and hands are swollen.Stated she is going to stop taking.Message sent to [name] NP for advice.

## 2014-04-25 NOTE — Telephone Encounter (Signed)
Returned call to patient Regina Kicks NP agreed stop taking LIpitor.Advised take Benadryl if needed.Advised to go to ER if symptoms increase.

## 2014-05-26 ENCOUNTER — Other Ambulatory Visit: Payer: Self-pay | Admitting: Cardiovascular Disease

## 2014-05-26 NOTE — Telephone Encounter (Signed)
Rx was sent to pharmacy electronically. 

## 2014-06-14 ENCOUNTER — Inpatient Hospital Stay (HOSPITAL_COMMUNITY): Payer: PRIVATE HEALTH INSURANCE

## 2014-06-14 ENCOUNTER — Encounter (HOSPITAL_COMMUNITY): Payer: Self-pay | Admitting: Emergency Medicine

## 2014-06-14 ENCOUNTER — Inpatient Hospital Stay (HOSPITAL_COMMUNITY)
Admission: EM | Admit: 2014-06-14 | Discharge: 2014-06-19 | DRG: 377 | Disposition: A | Discharge status: Skilled Nursing Facility | Payer: PRIVATE HEALTH INSURANCE | Attending: Internal Medicine | Admitting: Internal Medicine

## 2014-06-14 DIAGNOSIS — N179 Acute kidney failure, unspecified: Secondary | ICD-10-CM | POA: Diagnosis present

## 2014-06-14 DIAGNOSIS — E118 Type 2 diabetes mellitus with unspecified complications: Secondary | ICD-10-CM

## 2014-06-14 DIAGNOSIS — D638 Anemia in other chronic diseases classified elsewhere: Secondary | ICD-10-CM | POA: Diagnosis present

## 2014-06-14 DIAGNOSIS — Z79899 Other long term (current) drug therapy: Secondary | ICD-10-CM | POA: Diagnosis not present

## 2014-06-14 DIAGNOSIS — R0689 Other abnormalities of breathing: Secondary | ICD-10-CM | POA: Diagnosis not present

## 2014-06-14 DIAGNOSIS — Z9049 Acquired absence of other specified parts of digestive tract: Secondary | ICD-10-CM | POA: Diagnosis present

## 2014-06-14 DIAGNOSIS — K219 Gastro-esophageal reflux disease without esophagitis: Secondary | ICD-10-CM | POA: Diagnosis present

## 2014-06-14 DIAGNOSIS — E861 Hypovolemia: Secondary | ICD-10-CM | POA: Diagnosis present

## 2014-06-14 DIAGNOSIS — K7030 Alcoholic cirrhosis of liver without ascites: Secondary | ICD-10-CM | POA: Diagnosis present

## 2014-06-14 DIAGNOSIS — D500 Iron deficiency anemia secondary to blood loss (chronic): Secondary | ICD-10-CM | POA: Diagnosis present

## 2014-06-14 DIAGNOSIS — E1122 Type 2 diabetes mellitus with diabetic chronic kidney disease: Secondary | ICD-10-CM | POA: Diagnosis present

## 2014-06-14 DIAGNOSIS — Z9981 Dependence on supplemental oxygen: Secondary | ICD-10-CM

## 2014-06-14 DIAGNOSIS — I5032 Chronic diastolic (congestive) heart failure: Secondary | ICD-10-CM

## 2014-06-14 DIAGNOSIS — Z880 Allergy status to penicillin: Secondary | ICD-10-CM | POA: Diagnosis not present

## 2014-06-14 DIAGNOSIS — D62 Acute posthemorrhagic anemia: Secondary | ICD-10-CM | POA: Diagnosis present

## 2014-06-14 DIAGNOSIS — K625 Hemorrhage of anus and rectum: Secondary | ICD-10-CM

## 2014-06-14 DIAGNOSIS — I739 Peripheral vascular disease, unspecified: Secondary | ICD-10-CM | POA: Diagnosis present

## 2014-06-14 DIAGNOSIS — J449 Chronic obstructive pulmonary disease, unspecified: Secondary | ICD-10-CM | POA: Diagnosis present

## 2014-06-14 DIAGNOSIS — R001 Bradycardia, unspecified: Secondary | ICD-10-CM | POA: Diagnosis present

## 2014-06-14 DIAGNOSIS — K5731 Diverticulosis of large intestine without perforation or abscess with bleeding: Principal | ICD-10-CM | POA: Diagnosis present

## 2014-06-14 DIAGNOSIS — R4182 Altered mental status, unspecified: Secondary | ICD-10-CM

## 2014-06-14 DIAGNOSIS — K861 Other chronic pancreatitis: Secondary | ICD-10-CM | POA: Diagnosis present

## 2014-06-14 DIAGNOSIS — K7460 Unspecified cirrhosis of liver: Secondary | ICD-10-CM

## 2014-06-14 DIAGNOSIS — J9621 Acute and chronic respiratory failure with hypoxia: Secondary | ICD-10-CM | POA: Diagnosis not present

## 2014-06-14 DIAGNOSIS — R05 Cough: Secondary | ICD-10-CM

## 2014-06-14 DIAGNOSIS — N183 Chronic kidney disease, stage 3 unspecified: Secondary | ICD-10-CM

## 2014-06-14 DIAGNOSIS — Z87891 Personal history of nicotine dependence: Secondary | ICD-10-CM | POA: Diagnosis not present

## 2014-06-14 DIAGNOSIS — Z85038 Personal history of other malignant neoplasm of large intestine: Secondary | ICD-10-CM

## 2014-06-14 DIAGNOSIS — Z452 Encounter for adjustment and management of vascular access device: Secondary | ICD-10-CM

## 2014-06-14 DIAGNOSIS — Z86010 Personal history of colonic polyps: Secondary | ICD-10-CM

## 2014-06-14 DIAGNOSIS — Z881 Allergy status to other antibiotic agents status: Secondary | ICD-10-CM

## 2014-06-14 DIAGNOSIS — R140 Abdominal distension (gaseous): Secondary | ICD-10-CM

## 2014-06-14 DIAGNOSIS — I129 Hypertensive chronic kidney disease with stage 1 through stage 4 chronic kidney disease, or unspecified chronic kidney disease: Secondary | ICD-10-CM | POA: Diagnosis present

## 2014-06-14 DIAGNOSIS — K922 Gastrointestinal hemorrhage, unspecified: Secondary | ICD-10-CM

## 2014-06-14 DIAGNOSIS — Z7982 Long term (current) use of aspirin: Secondary | ICD-10-CM

## 2014-06-14 DIAGNOSIS — G9340 Encephalopathy, unspecified: Secondary | ICD-10-CM | POA: Diagnosis not present

## 2014-06-14 DIAGNOSIS — Z638 Other specified problems related to primary support group: Secondary | ICD-10-CM

## 2014-06-14 DIAGNOSIS — J9690 Respiratory failure, unspecified, unspecified whether with hypoxia or hypercapnia: Secondary | ICD-10-CM

## 2014-06-14 DIAGNOSIS — K5900 Constipation, unspecified: Secondary | ICD-10-CM

## 2014-06-14 DIAGNOSIS — J9600 Acute respiratory failure, unspecified whether with hypoxia or hypercapnia: Secondary | ICD-10-CM

## 2014-06-14 DIAGNOSIS — R059 Cough, unspecified: Secondary | ICD-10-CM

## 2014-06-14 DIAGNOSIS — Z4659 Encounter for fitting and adjustment of other gastrointestinal appliance and device: Secondary | ICD-10-CM

## 2014-06-14 LAB — TYPE AND SCREEN
ABO/RH(D): O POS
Antibody Screen: NEGATIVE
DONOR AG TYPE: NEGATIVE
Donor AG Type: NEGATIVE
Unit division: 0
Unit division: 0

## 2014-06-14 LAB — BASIC METABOLIC PANEL
ANION GAP: 12 (ref 5–15)
Anion gap: 12 (ref 5–15)
BUN: 18 mg/dL (ref 6–23)
BUN: 16 mg/dL (ref 6–23)
CALCIUM: 8.2 mg/dL — AB (ref 8.4–10.5)
CHLORIDE: 106 meq/L (ref 96–112)
CHLORIDE: 106 meq/L (ref 96–112)
CO2: 20 meq/L (ref 19–32)
CO2: 20 mEq/L (ref 19–32)
CREATININE: 1.11 mg/dL — AB (ref 0.50–1.10)
Calcium: 8.9 mg/dL (ref 8.4–10.5)
Creatinine, Ser: 1.2 mg/dL — ABNORMAL HIGH (ref 0.50–1.10)
GFR calc Af Amer: 52 mL/min — ABNORMAL LOW (ref >90)
GFR calc Af Amer: 57 mL/min — ABNORMAL LOW (ref >90)
GFR calc non Af Amer: 45 mL/min — ABNORMAL LOW (ref >90)
GFR calc non Af Amer: 49 mL/min — ABNORMAL LOW (ref >90)
Glucose, Bld: 264 mg/dL — ABNORMAL HIGH (ref 70–99)
Glucose, Bld: 212 mg/dL — ABNORMAL HIGH (ref 70–99)
POTASSIUM: 4.2 meq/L (ref 3.7–5.3)
Potassium: 4.1 mEq/L (ref 3.7–5.3)
SODIUM: 138 meq/L (ref 137–147)
Sodium: 138 mEq/L (ref 137–147)

## 2014-06-14 LAB — CBC
HCT: 37.3 % (ref 36.0–46.0)
HCT: 27.5 % — ABNORMAL LOW (ref 36.0–46.0)
HCT: 33.3 % — ABNORMAL LOW (ref 36.0–46.0)
HEMATOCRIT: 32.1 % — AB (ref 36.0–46.0)
HEMATOCRIT: 31.5 % — AB (ref 36.0–46.0)
HEMOGLOBIN: 11.4 g/dL — AB (ref 12.0–15.0)
Hemoglobin: 10 g/dL — ABNORMAL LOW (ref 12.0–15.0)
Hemoglobin: 8.6 g/dL — ABNORMAL LOW (ref 12.0–15.0)
Hemoglobin: 10.8 g/dL — ABNORMAL LOW (ref 12.0–15.0)
Hemoglobin: 10.1 g/dL — ABNORMAL LOW (ref 12.0–15.0)
MCH: 25.4 pg — ABNORMAL LOW (ref 26.0–34.0)
MCH: 25.2 pg — ABNORMAL LOW (ref 26.0–34.0)
MCH: 25.1 pg — AB (ref 26.0–34.0)
MCH: 26.5 pg (ref 26.0–34.0)
MCH: 26 pg (ref 26.0–34.0)
MCHC: 30.6 g/dL (ref 30.0–36.0)
MCHC: 31.2 g/dL (ref 30.0–36.0)
MCHC: 31.3 g/dL (ref 30.0–36.0)
MCHC: 32.4 g/dL (ref 30.0–36.0)
MCHC: 32.1 g/dL (ref 30.0–36.0)
MCV: 83.1 fL (ref 78.0–100.0)
MCV: 80.9 fL (ref 78.0–100.0)
MCV: 80.4 fL (ref 78.0–100.0)
MCV: 81.6 fL (ref 78.0–100.0)
MCV: 81 fL (ref 78.0–100.0)
PLATELETS: 279 10*3/uL (ref 150–400)
PLATELETS: 262 10*3/uL (ref 150–400)
PLATELETS: 219 10*3/uL (ref 150–400)
Platelets: 321 10*3/uL (ref 150–400)
Platelets: 226 10*3/uL (ref 150–400)
RBC: 4.49 MIL/uL (ref 3.87–5.11)
RBC: 3.97 MIL/uL (ref 3.87–5.11)
RBC: 3.42 MIL/uL — AB (ref 3.87–5.11)
RBC: 4.08 MIL/uL (ref 3.87–5.11)
RBC: 3.89 MIL/uL (ref 3.87–5.11)
RDW: 15.5 % (ref 11.5–15.5)
RDW: 15.4 % (ref 11.5–15.5)
RDW: 15.4 % (ref 11.5–15.5)
RDW: 15.3 % (ref 11.5–15.5)
RDW: 15.1 % (ref 11.5–15.5)
WBC: 12.1 10*3/uL — ABNORMAL HIGH (ref 4.0–10.5)
WBC: 11.7 10*3/uL — AB (ref 4.0–10.5)
WBC: 12.1 10*3/uL — ABNORMAL HIGH (ref 4.0–10.5)
WBC: 12.8 10*3/uL — ABNORMAL HIGH (ref 4.0–10.5)
WBC: 14.1 10*3/uL — ABNORMAL HIGH (ref 4.0–10.5)

## 2014-06-14 LAB — TROPONIN I
Troponin I: <0.3 ng/mL (ref <0.30)
Troponin I: <0.3 ng/mL (ref <0.30)

## 2014-06-14 LAB — POCT I-STAT 3, ART BLOOD GAS (G3+)
Acid-base deficit: 3 mmol/L — ABNORMAL HIGH (ref 0.0–2.0)
Bicarbonate: 22.4 mEq/L (ref 20.0–24.0)
O2 SAT: 100 %
PCO2 ART: 41.1 mmHg (ref 35.0–45.0)
Patient temperature: 98
TCO2: 24 mmol/L (ref 0–100)
pH, Arterial: 7.342 — ABNORMAL LOW (ref 7.350–7.450)
pO2, Arterial: 324 mmHg — ABNORMAL HIGH (ref 80.0–100.0)

## 2014-06-14 LAB — GLUCOSE, CAPILLARY
GLUCOSE-CAPILLARY: 140 mg/dL — AB (ref 70–99)
GLUCOSE-CAPILLARY: 186 mg/dL — AB (ref 70–99)
GLUCOSE-CAPILLARY: 139 mg/dL — AB (ref 70–99)
Glucose-Capillary: 275 mg/dL — ABNORMAL HIGH (ref 70–99)
Glucose-Capillary: 141 mg/dL — ABNORMAL HIGH (ref 70–99)
Glucose-Capillary: 116 mg/dL — ABNORMAL HIGH (ref 70–99)

## 2014-06-14 LAB — COMPREHENSIVE METABOLIC PANEL
ALBUMIN: 2.4 g/dL — AB (ref 3.5–5.2)
ALK PHOS: 89 U/L (ref 39–117)
ALT: 14 U/L (ref 0–35)
ALT: 11 U/L (ref 0–35)
ANION GAP: 12 (ref 5–15)
AST: 23 U/L (ref 0–37)
AST: 16 U/L (ref 0–37)
Albumin: 3.2 g/dL — ABNORMAL LOW (ref 3.5–5.2)
Alkaline Phosphatase: 67 U/L (ref 39–117)
Anion gap: 12 (ref 5–15)
BUN: 19 mg/dL (ref 6–23)
BUN: 18 mg/dL (ref 6–23)
CALCIUM: 8.2 mg/dL — AB (ref 8.4–10.5)
CHLORIDE: 104 meq/L (ref 96–112)
CO2: 23 mEq/L (ref 19–32)
CO2: 19 meq/L (ref 19–32)
CREATININE: 1.32 mg/dL — AB (ref 0.50–1.10)
CREATININE: 1.3 mg/dL — AB (ref 0.50–1.10)
Calcium: 9.3 mg/dL (ref 8.4–10.5)
Chloride: 111 mEq/L (ref 96–112)
GFR calc Af Amer: 47 mL/min — ABNORMAL LOW (ref >90)
GFR calc Af Amer: 47 mL/min — ABNORMAL LOW (ref >90)
GFR calc non Af Amer: 40 mL/min — ABNORMAL LOW (ref >90)
GFR, EST NON AFRICAN AMERICAN: 41 mL/min — AB (ref >90)
GLUCOSE: 238 mg/dL — AB (ref 70–99)
Glucose, Bld: 150 mg/dL — ABNORMAL HIGH (ref 70–99)
POTASSIUM: 4.2 meq/L (ref 3.7–5.3)
Potassium: 3.7 mEq/L (ref 3.7–5.3)
SODIUM: 142 meq/L (ref 137–147)
Sodium: 139 mEq/L (ref 137–147)
Total Bilirubin: <0.2 mg/dL — ABNORMAL LOW (ref 0.3–1.2)
Total Protein: 7.5 g/dL (ref 6.0–8.3)
Total Protein: 5.5 g/dL — ABNORMAL LOW (ref 6.0–8.3)

## 2014-06-14 LAB — BLOOD GAS, ARTERIAL
ACID-BASE DEFICIT: 4.1 mmol/L — AB (ref 0.0–2.0)
Bicarbonate: 20.9 mEq/L (ref 20.0–24.0)
Drawn by: 330991
O2 CONTENT: 2 L/min
O2 Saturation: 91 %
PCO2 ART: 41.7 mmHg (ref 35.0–45.0)
PO2 ART: 64.5 mmHg — AB (ref 80.0–100.0)
Patient temperature: 98.6
TCO2: 22.2 mmol/L (ref 0–100)
pH, Arterial: 7.322 — ABNORMAL LOW (ref 7.350–7.450)

## 2014-06-14 LAB — URINALYSIS, ROUTINE W REFLEX MICROSCOPIC
BILIRUBIN URINE: NEGATIVE
Glucose, UA: NEGATIVE mg/dL
Hgb urine dipstick: NEGATIVE
Ketones, ur: NEGATIVE mg/dL
Leukocytes, UA: NEGATIVE
NITRITE: NEGATIVE
PH: 6 (ref 5.0–8.0)
Protein, ur: NEGATIVE mg/dL
SPECIFIC GRAVITY, URINE: 1.009 (ref 1.005–1.030)
Urobilinogen, UA: 0.2 mg/dL (ref 0.0–1.0)

## 2014-06-14 LAB — PREPARE RBC (CROSSMATCH)

## 2014-06-14 LAB — AMMONIA: AMMONIA: 80 umol/L — AB (ref 11–60)

## 2014-06-14 LAB — MRSA PCR SCREENING: MRSA BY PCR: NEGATIVE

## 2014-06-14 LAB — STREP PNEUMONIAE URINARY ANTIGEN: Strep Pneumo Urinary Antigen: NEGATIVE

## 2014-06-14 LAB — PROTIME-INR
INR: 1.11 (ref 0.00–1.49)
PROTHROMBIN TIME: 14.4 s (ref 11.6–15.2)

## 2014-06-14 LAB — APTT: aPTT: 36 seconds (ref 24–37)

## 2014-06-14 LAB — PROCALCITONIN: Procalcitonin: <0.1 ng/mL

## 2014-06-14 MED ORDER — FOLIC ACID 1 MG PO TABS
1.0000 mg | ORAL_TABLET | Freq: Every day | ORAL | Status: DC
  Filled 2014-06-14 (×2): qty 1

## 2014-06-14 MED ORDER — FENTANYL CITRATE 0.05 MG/ML IJ SOLN: 50.0000 ug | Freq: Once | INTRAMUSCULAR | Status: AC

## 2014-06-14 MED ORDER — INSULIN ASPART 100 UNIT/ML ~~LOC~~ SOLN
0.0000 [IU] | SUBCUTANEOUS | Status: DC
  Administered 2014-06-14: 3 [IU] via SUBCUTANEOUS
  Administered 2014-06-14: 2 [IU] via SUBCUTANEOUS
  Administered 2014-06-15: 3 [IU] via SUBCUTANEOUS
  Administered 2014-06-15 (×4): 2 [IU] via SUBCUTANEOUS
  Administered 2014-06-15 – 2014-06-16 (×3): 3 [IU] via SUBCUTANEOUS
  Administered 2014-06-16: 5 [IU] via SUBCUTANEOUS
  Administered 2014-06-16: 3 [IU] via SUBCUTANEOUS

## 2014-06-14 MED ORDER — FUROSEMIDE 40 MG PO TABS: 40.0000 mg | ORAL_TABLET | Freq: Every morning | ORAL | Status: DC

## 2014-06-14 MED ORDER — CETYLPYRIDINIUM CHLORIDE 0.05 % MT LIQD
7.0000 mL | Freq: Four times a day (QID) | OROMUCOSAL | Status: DC
  Administered 2014-06-15 – 2014-06-16 (×6): 7 mL via OROMUCOSAL

## 2014-06-14 MED ORDER — ALBUTEROL SULFATE (2.5 MG/3ML) 0.083% IN NEBU: 2.5000 mg | INHALATION_SOLUTION | Freq: Four times a day (QID) | RESPIRATORY_TRACT | Status: DC | PRN

## 2014-06-14 MED ORDER — ALLOPURINOL 100 MG PO TABS
100.0000 mg | ORAL_TABLET | Freq: Two times a day (BID) | ORAL | Status: DC
  Filled 2014-06-14 (×2): qty 1

## 2014-06-14 MED ORDER — PANTOPRAZOLE SODIUM 40 MG IV SOLR
40.0000 mg | INTRAVENOUS | Status: DC
  Administered 2014-06-14 – 2014-06-15 (×2): 40 mg via INTRAVENOUS
  Filled 2014-06-14 (×4): qty 40

## 2014-06-14 MED ORDER — ETOMIDATE 2 MG/ML IV SOLN
16.5000 mg | INTRAVENOUS | Status: DC | PRN
  Administered 2014-06-14: 20 mg via INTRAVENOUS

## 2014-06-14 MED ORDER — MIDAZOLAM HCL 2 MG/2ML IJ SOLN
INTRAMUSCULAR | Status: AC
  Administered 2014-06-14: 2 mg
  Filled 2014-06-14: qty 2

## 2014-06-14 MED ORDER — VECURONIUM BROMIDE 10 MG IV SOLR: 5.5000 mg | INTRAVENOUS | Status: DC | PRN

## 2014-06-14 MED ORDER — LIDOCAINE HCL (CARDIAC) 20 MG/ML IV SOLN: 83.0000 mg | INTRAVENOUS | Status: DC | PRN

## 2014-06-14 MED ORDER — LINAGLIPTIN 5 MG PO TABS
5.0000 mg | ORAL_TABLET | Freq: Every day | ORAL | Status: DC
  Filled 2014-06-14: qty 1

## 2014-06-14 MED ORDER — FENTANYL CITRATE 0.05 MG/ML IJ SOLN: 50.0000 ug | INTRAMUSCULAR | Status: DC | PRN

## 2014-06-14 MED ORDER — IPRATROPIUM-ALBUTEROL 0.5-2.5 (3) MG/3ML IN SOLN
3.0000 mL | Freq: Four times a day (QID) | RESPIRATORY_TRACT | Status: DC
  Administered 2014-06-14 – 2014-06-19 (×18): 3 mL via RESPIRATORY_TRACT
  Filled 2014-06-14 (×20): qty 3

## 2014-06-14 MED ORDER — PANTOPRAZOLE SODIUM 40 MG PO TBEC: 40.0000 mg | DELAYED_RELEASE_TABLET | Freq: Every day | ORAL | Status: DC

## 2014-06-14 MED ORDER — MONTELUKAST SODIUM 10 MG PO TABS
10.0000 mg | ORAL_TABLET | Freq: Every day | ORAL | Status: DC
  Filled 2014-06-14: qty 1

## 2014-06-14 MED ORDER — ONDANSETRON HCL 4 MG/2ML IJ SOLN: 4.0000 mg | Freq: Four times a day (QID) | INTRAMUSCULAR | Status: DC | PRN

## 2014-06-14 MED ORDER — FENTANYL BOLUS VIA INFUSION
25.0000 ug | INTRAVENOUS | Status: DC | PRN
  Administered 2014-06-15 – 2014-06-16 (×2): 50 ug via INTRAVENOUS
  Filled 2014-06-14: qty 50

## 2014-06-14 MED ORDER — ROCURONIUM BROMIDE 50 MG/5ML IV SOLN
55.0000 mg | INTRAVENOUS | Status: DC | PRN
  Administered 2014-06-14: 50 mg via INTRAVENOUS
  Filled 2014-06-14: qty 12

## 2014-06-14 MED ORDER — ZOLPIDEM TARTRATE 5 MG PO TABS: 5.0000 mg | ORAL_TABLET | Freq: Every evening | ORAL | Status: DC | PRN

## 2014-06-14 MED ORDER — ATROPINE SULFATE 1 MG/ML IJ SOLN: 1.0000 mg | INTRAMUSCULAR | Status: DC | PRN

## 2014-06-14 MED ORDER — LEVOFLOXACIN IN D5W 750 MG/150ML IV SOLN: 750.0000 mg | INTRAVENOUS | Status: DC

## 2014-06-14 MED ORDER — SODIUM CHLORIDE 0.9 % IV SOLN
0.0000 ug/h | INTRAVENOUS | Status: DC
  Administered 2014-06-14 – 2014-06-16 (×3): 50 ug/h via INTRAVENOUS
  Filled 2014-06-14 (×2): qty 50

## 2014-06-14 MED ORDER — SODIUM CHLORIDE 0.9 % IV SOLN: Freq: Once | INTRAVENOUS | Status: AC

## 2014-06-14 MED ORDER — KCL IN DEXTROSE-NACL 20-5-0.45 MEQ/L-%-% IV SOLN
INTRAVENOUS | Status: DC
  Administered 2014-06-14 – 2014-06-15 (×2): via INTRAVENOUS
  Filled 2014-06-14 (×4): qty 1000

## 2014-06-14 MED ORDER — VANCOMYCIN HCL IN DEXTROSE 1-5 GM/200ML-% IV SOLN
1000.0000 mg | INTRAVENOUS | Status: DC
  Administered 2014-06-15: 1000 mg via INTRAVENOUS
  Filled 2014-06-14: qty 200

## 2014-06-14 MED ORDER — SUCCINYLCHOLINE CHLORIDE 20 MG/ML IJ SOLN
83.0000 mg | INTRAMUSCULAR | Status: DC | PRN
  Filled 2014-06-14: qty 7.5

## 2014-06-14 MED ORDER — ROCURONIUM BROMIDE 50 MG/5ML IV SOLN
55.0000 mg | INTRAVENOUS | Status: AC | PRN
  Filled 2014-06-14: qty 12

## 2014-06-14 MED ORDER — ETOMIDATE 2 MG/ML IV SOLN: 16.5000 mg | INTRAVENOUS | Status: AC | PRN

## 2014-06-14 MED ORDER — FENTANYL CITRATE 0.05 MG/ML IJ SOLN
INTRAMUSCULAR | Status: AC
  Filled 2014-06-14: qty 2

## 2014-06-14 MED ORDER — ASPIRIN EC 81 MG PO TBEC
81.0000 mg | DELAYED_RELEASE_TABLET | Freq: Every day | ORAL | Status: DC
  Filled 2014-06-14: qty 1

## 2014-06-14 MED ORDER — CILOSTAZOL 50 MG PO TABS
50.0000 mg | ORAL_TABLET | Freq: Two times a day (BID) | ORAL | Status: DC
  Administered 2014-06-14: 50 mg via ORAL
  Filled 2014-06-14 (×3): qty 1

## 2014-06-14 MED ORDER — ONDANSETRON HCL 4 MG PO TABS: 4.0000 mg | ORAL_TABLET | Freq: Four times a day (QID) | ORAL | Status: DC | PRN

## 2014-06-14 MED ORDER — INSULIN DETEMIR 100 UNIT/ML ~~LOC~~ SOLN
8.0000 [IU] | Freq: Every day | SUBCUTANEOUS | Status: DC
  Filled 2014-06-14: qty 0.08

## 2014-06-14 MED ORDER — CHLORHEXIDINE GLUCONATE 0.12 % MT SOLN
15.0000 mL | Freq: Two times a day (BID) | OROMUCOSAL | Status: DC
  Administered 2014-06-14 – 2014-06-16 (×4): 15 mL via OROMUCOSAL
  Filled 2014-06-14 (×3): qty 15

## 2014-06-14 MED ORDER — SODIUM CHLORIDE 0.9 % IV BOLUS (SEPSIS)
750.0000 mL | Freq: Once | INTRAVENOUS | Status: AC
  Administered 2014-06-14: 750 mL via INTRAVENOUS

## 2014-06-14 MED ORDER — LEVOFLOXACIN IN D5W 750 MG/150ML IV SOLN
750.0000 mg | INTRAVENOUS | Status: DC
  Administered 2014-06-14: 750 mg via INTRAVENOUS
  Filled 2014-06-14: qty 150

## 2014-06-14 MED ORDER — METHOCARBAMOL 500 MG PO TABS
500.0000 mg | ORAL_TABLET | Freq: Every day | ORAL | Status: DC | PRN
  Filled 2014-06-14: qty 1

## 2014-06-14 MED ORDER — INSULIN ASPART 100 UNIT/ML ~~LOC~~ SOLN
0.0000 [IU] | Freq: Three times a day (TID) | SUBCUTANEOUS | Status: DC
  Administered 2014-06-14: 5 [IU] via SUBCUTANEOUS

## 2014-06-14 MED ORDER — FENTANYL CITRATE 0.05 MG/ML IJ SOLN
INTRAMUSCULAR | Status: AC
  Administered 2014-06-14: 50 ug
  Filled 2014-06-14: qty 2

## 2014-06-14 MED ORDER — FENTANYL CITRATE 0.05 MG/ML IJ SOLN
50.0000 ug | INTRAMUSCULAR | Status: DC | PRN
  Administered 2014-06-14 (×2): 50 ug via INTRAVENOUS
  Filled 2014-06-14: qty 2

## 2014-06-14 MED ORDER — DIPHENHYDRAMINE HCL 25 MG PO CAPS
25.0000 mg | ORAL_CAPSULE | Freq: Every evening | ORAL | Status: DC | PRN
Start: 2014-06-14 — End: 2014-06-14

## 2014-06-14 MED ORDER — OXYCODONE HCL 5 MG PO TABS: 5.0000 mg | ORAL_TABLET | Freq: Every day | ORAL | Status: DC | PRN

## 2014-06-14 MED ORDER — SODIUM CHLORIDE 0.9 % IV SOLN
1000.0000 mL | INTRAVENOUS | Status: DC
  Administered 2014-06-14: 1000 mL via INTRAVENOUS

## 2014-06-14 MED ORDER — GLIMEPIRIDE 4 MG PO TABS
4.0000 mg | ORAL_TABLET | Freq: Every day | ORAL | Status: DC
  Administered 2014-06-14: 4 mg via ORAL
  Filled 2014-06-14 (×2): qty 1

## 2014-06-14 MED ORDER — METOPROLOL TARTRATE 25 MG PO TABS
25.0000 mg | ORAL_TABLET | Freq: Two times a day (BID) | ORAL | Status: DC
  Filled 2014-06-14 (×2): qty 1

## 2014-06-14 MED ORDER — FLUTICASONE PROPIONATE 50 MCG/ACT NA SUSP
2.0000 | Freq: Every day | NASAL | Status: DC
  Filled 2014-06-14: qty 16

## 2014-06-14 MED ORDER — SODIUM CHLORIDE 0.9 % IV SOLN
Freq: Once | INTRAVENOUS | Status: AC
  Administered 2014-06-14: 14:00:00 via INTRAVENOUS

## 2014-06-14 MED ORDER — SODIUM CHLORIDE 0.9 % IJ SOLN
3.0000 mL | Freq: Two times a day (BID) | INTRAMUSCULAR | Status: DC
  Administered 2014-06-15 – 2014-06-19 (×9): 3 mL via INTRAVENOUS

## 2014-06-14 MED ORDER — ALBUTEROL SULFATE HFA 108 (90 BASE) MCG/ACT IN AERS: 2.0000 | INHALATION_SPRAY | Freq: Four times a day (QID) | RESPIRATORY_TRACT | Status: DC | PRN

## 2014-06-14 MED ORDER — VANCOMYCIN HCL IN DEXTROSE 1-5 GM/200ML-% IV SOLN
1000.0000 mg | Freq: Once | INTRAVENOUS | Status: AC
  Administered 2014-06-14: 1000 mg via INTRAVENOUS
  Filled 2014-06-14: qty 200

## 2014-06-14 NOTE — Procedures (Signed)
Central Venous Catheter Insertion Procedure Note [name] [id number] Feb 20, 1946  Procedure: Insertion of Central Venous Catheter Indications: Assessment of intravascular volume, Drug and/or fluid administration and Frequent blood sampling  Procedure Details Consent: Unable to obtain consent because of emergent medical necessity. Time Out: Verified patient identification, verified procedure, site/side was marked, verified correct patient position, special equipment/implants available, medications/allergies/relevent history reviewed, required imaging and test results available.  Performed  Maximum sterile technique was used including antiseptics, cap, gloves, gown, hand hygiene, mask and sheet. Skin prep: Chlorhexidine; local anesthetic administered A antimicrobial bonded/coated triple lumen catheter was placed in the left internal jugular vein using the Seldinger technique.  Evaluation Blood flow good Complications: No apparent complications Patient did tolerate procedure well. Chest X-ray ordered to verify placement.  CXR: pending.   Performed under direct MD supervision.  Performed using ultrasound guidance.  Wire visualized in vessel under ultrasound.   Nickolas [place], NP 06/14/2014  9:48 AM Pager: [phone] or [phone]   I was present for and supervised the entire procedure  Merton Border, MD ; [facility] service Mobile [phone].  After 5:30 PM or weekends, call [phone]

## 2014-06-14 NOTE — Procedures (Signed)
Oral Intubation Procedure Note   Procedure: Intubation Indications: Hypoventilation, AMS Consent: Unable to obtain consent because of altered level of consciousness. Time Out: Verified patient identification, verified procedure, site/side was marked, verified correct patient position, special equipment/implants available, medications/allergies/relevent history reviewed, required imaging and test results available.   Pre-meds: Etomidate 20 mg IV  Neuromuscular blockade: Rocuronium 50 mg IV  Laryngoscope: #3 MAC  Visualization: cords fully visualized  ETT: 7.5 ETT passed on first attempt and secured @ 23 cm at upper gums  Findings:  Normal airway   Evaluation:  CXR revealed proper position of ETT  Pt tolerated procedure well without complications   Merton Border, MD ; [facility] service Mobile [phone].  After 5:30 PM or weekends, call [phone]

## 2014-06-14 NOTE — Progress Notes (Signed)
0820 RN is called to pt room stat, pt found to be breathing, slow to response to voice but not opening her eyes when talking to or asked to. Pt family at bed side report pt having seizure like activity and became unresponsive afterwards; pt vitals taken; charge RN in room; pt belly noted to be distended with clots noted in bed. Rapid response RN called and at side; MD notified and ordered stat ABG and Type & Screen; Dr. [place] Pink at bedside.  MD ordered to transport pt off to ICU. Francis Gaines Sajan Cheatwood RN.

## 2014-06-14 NOTE — ED Notes (Signed)
PER EMS: pt from home and reports rectal bleeding that started about an hour ago with copious amounts of bright red blood clots. Pt denies nausea, vomiting, dizziness or any pain. A&Ox4, ambulatory.

## 2014-06-14 NOTE — Progress Notes (Signed)
ETT pulled back 3cm per [facility] NP.  Pt tolerated well, BBS heard on auscultation

## 2014-06-14 NOTE — ED Notes (Signed)
Dr.Knapp at bedside  

## 2014-06-14 NOTE — ED Notes (Signed)
Pt up to [facility] on arrival. (Denies: pain, nausea, sob or dizziness), pt alert, NAD, cal;m, interactive, resps e/u, speaking in clear complete sentences. Skin W&D.

## 2014-06-14 NOTE — Consult Note (Signed)
PULMONARY / CRITICAL CARE MEDICINE   Name: [name] MRN: [id number] DOB: 04-02-1946    ADMISSION DATE:  06/14/2014 CONSULTATION DATE:  06/14/14  REFERRING MD :  [place] Pink   CHIEF COMPLAINT:  AMS  INITIAL PRESENTATION: 68yo female with hx COPD, DM, CKD, hx colon ca s/p resection 2012, ETOH cirrhosis presented 10/17 with BRBPR.  She was admitted by [facility] to floor and quickly deteriorated with AMS, requiring tx to ICU and intubation.    STUDIES:  CT head 10/17>>>  SIGNIFICANT EVENTS:    HISTORY OF PRESENT ILLNESS:  68yo female with hx COPD, DM, CKD, hx colon ca s/p resection 2012, ETOH cirrhosis presented 10/17 with BRBPR.  She was admitted by [facility] to floor and quickly deteriorated with AMS, requiring tx to ICU and intubation.    PAST MEDICAL HISTORY :  Past Medical History  Diagnosis Date  . Alcoholic cirrhosis   . Reflux esophagitis   . Gastroesophageal reflux   . Colon polyps   . CHF (congestive heart failure) 10/13    grade 1 diastolic dysfunction, Nl LVF  . Hypertension   . History of GI diverticular bleed   . Pneumonia     history of  . Asthma   . Anemia   . Blood transfusion few years ago  . Villous adenoma of colon 05/10/11  . PVD (peripheral vascular disease) 2009    bilat iliac stenting  . Chronic obstructive pulmonary disease (COPD)   . Arthritis   . Glaucoma     left  . Gout   . Acute venous embolism and thrombosis of deep vessels of proximal lower extremity   . Back pain   . Leg swelling   . Diabetes mellitus     insulin dep   . Hepatitis     alcoholic hepatitis  . Depression   . Anxiety   . On home oxygen therapy oxygen 2 liter per minute prn per [place]     has past surgical history that includes Cataract extraction (Right, yrs ago); Colonoscopy; Laparoscopic assissted total colectomy w/ j-pouch (04/22/11); Ventral hernia repair (06/13/2012); Abdominal hysterectomy (yrs ago); Esophagogastroduodenoscopy (egd) with propofol (N/A, 08/06/2013); and  Colonoscopy with propofol (N/A, 08/06/2013). Prior to Admission medications   Medication Sig Start Date End Date Taking? Authorizing Provider  albuterol (PROVENTIL HFA;VENTOLIN HFA) 108 (90 BASE) MCG/ACT inhaler Inhale 2 puffs into the lungs every 6 (six) hours as needed. For shortness of breath   Yes Historical Provider, MD  albuterol (PROVENTIL) (2.5 MG/3ML) 0.083% nebulizer solution Take 2.5 mg by nebulization every 6 (six) hours as needed for wheezing or shortness of breath.   Yes Historical Provider, MD  allopurinol (ZYLOPRIM) 100 MG tablet Take 100 mg by mouth 2 (two) times daily.   Yes Historical Provider, MD  aspirin 81 MG EC tablet Take 81 mg by mouth daily.     Yes Historical Provider, MD  cilostazol (PLETAL) 100 MG tablet Take 50 mg by mouth 2 (two) times daily before a meal.    Yes Historical Provider, MD  diclofenac sodium (VOLTAREN) 1 % GEL Apply 1 application topically daily as needed (leg pain). For leg pain   Yes Historical Provider, MD  diphenhydrAMINE (BENADRYL) 25 mg capsule Take 25 mg by mouth at bedtime as needed. For cough   Yes Historical Provider, MD  ergocalciferol (VITAMIN D2) 50000 UNITS capsule Take 50,000 Units by mouth once a week. On Monday   Yes Historical Provider, MD  fluticasone (FLONASE) 50 MCG/ACT nasal spray Place  2 sprays into the nose daily. 50 mcg /act 2 sprays nasal once a day    Yes Historical Provider, MD  folic acid (FOLVITE) 1 MG tablet Take 1 mg by mouth daily.   Yes Historical Provider, MD  furosemide (LASIX) 40 MG tablet Take 40 mg by mouth every morning.   Yes Historical Provider, MD  glimepiride (AMARYL) 4 MG tablet Take 4 mg by mouth Daily.  06/04/12  Yes Historical Provider, MD  Insulin Detemir (LEVEMIR FLEXTOUCH) 100 UNIT/ML Pen Inject 8 Units into the skin daily at 10 pm.   Yes Historical Provider, MD  lidocaine (LIDODERM) 5 % Place 1 patch onto the skin daily as needed (pain). Remove & Discard patch within 12 hours or as directed by MD   Yes  Historical Provider, MD  methocarbamol (ROBAXIN) 500 MG tablet Take 500 mg by mouth daily as needed for muscle spasms.    Yes Historical Provider, MD  metoprolol (LOPRESSOR) 50 MG tablet Take 0.5 tablets (25 mg total) by mouth 2 (two) times daily. 05/26/14  Yes [name], MD  montelukast (SINGULAIR) 10 MG tablet Take 10 mg by mouth at bedtime.    Yes Historical Provider, MD  omeprazole (PRILOSEC) 40 MG capsule Take 40 mg by mouth daily.    Yes Historical Provider, MD  oxyCODONE (OXY IR/ROXICODONE) 5 MG immediate release tablet Take 5 mg by mouth daily as needed for moderate pain.  03/13/13  Yes Historical Provider, MD  OXYGEN-HELIUM IN Inhale into the lungs. 2 liters nightly   Yes Historical Provider, MD  Polyethylene Glycol 400 (BLINK TEARS OP) Place 1 drop into the right eye daily as needed (dryness).    Yes Historical Provider, MD  sitaGLIPtan (JANUVIA) 50 MG tablet Take 50 mg by mouth daily.    Yes Historical Provider, MD  triamcinolone cream (KENALOG) 0.5 % Apply 1 application topically daily as needed. For legs   Yes Historical Provider, MD   Allergies  Allergen Reactions  . Penicillins Hives  . Lipitor [Atorvastatin] Swelling    FAMILY HISTORY:  indicated that her mother is alive. She indicated that her father is alive. She indicated that only one of her two sisters is alive. She indicated that four of her six brothers are alive.  SOCIAL HISTORY:  reports that she quit smoking about 8 years ago. Her smoking use included Cigarettes. She has a 15 pack-year smoking history. She has never used smokeless tobacco. She reports that she does not drink alcohol or use illicit drugs.  REVIEW OF SYSTEMS:  Unable.  AMS.   SUBJECTIVE:   VITAL SIGNS: Temp:  [98.5 F (36.9 C)-98.7 F (37.1 C)] 98.6 F (37 C) (10/17 0828) Pulse Rate:  [73-97] 73 (10/17 1030) Resp:  [8-25] 21 (10/17 1030) BP: (87-188)/(48-123) 188/123 mmHg (10/17 1030) SpO2:  [92 %-100 %] 100 % (10/17 0930) FiO2 (%):  [1  %] 1 % (10/17 0912) Weight:  [150 lb 9.2 oz (68.3 kg)] 150 lb 9.2 oz (68.3 kg) (10/17 0433) HEMODYNAMICS:   VENTILATOR SETTINGS: Vent Mode:  [-] PRVC FiO2 (%):  [1 %] 1 % Set Rate:  [14 bmp] 14 bmp Vt Set:  [500 mL] 500 mL PEEP:  [5 cmH20] 5 cmH20 Plateau Pressure:  [12 cmH20] 12 cmH20 INTAKE / OUTPUT:  Intake/Output Summary (Last 24 hours) at 06/14/14 1038 Last data filed at 06/14/14 0821  Gross per 24 hour  Intake    250 ml  Output      1 ml  Net  249 ml    PHYSICAL EXAMINATION: General:  Chronically ill appearing female, acutely ill  Neuro:  Obtunded, occasionally moans, pupils 76mm sluggish HEENT:  Mm pale, dry, no JVD, sclera anicteric  Cardiovascular:  s1s2 rrr Lungs:  resps shallow, labored, diminished throughout, few scattered wheeze  Abdomen:  Round, mildly distended but soft, hypoactive BS  Musculoskeletal:  Cool, pale, no edema   LABS:  CBC  Recent Labs Lab 06/14/14 0112 06/14/14 0455 06/14/14 0900  WBC 12.1* 11.7* 12.1*  HGB 11.4* 10.0* 8.6*  HCT 37.3 32.1* 27.5*  PLT 321 279 262   Coag's  Recent Labs Lab 06/14/14 0112  APTT 36  INR 1.11   BMET  Recent Labs Lab 06/14/14 0112 06/14/14 0455 06/14/14 0900  NA 139 138 142  K 4.2 4.2 3.7  CL 104 106 111  CO2 23 20 19   BUN 19 18 18   CREATININE 1.32* 1.20* 1.30*  GLUCOSE 238* 264* 150*   Electrolytes  Recent Labs Lab 06/14/14 0112 06/14/14 0455 06/14/14 0900  CALCIUM 9.3 8.9 8.2*   Sepsis Markers No results found for this basename: LATICACIDVEN, PROCALCITON, O2SATVEN,  in the last 168 hours ABG  Recent Labs Lab 06/14/14 0843 06/14/14 1005  PHART 7.322* 7.342*  PCO2ART 41.7 41.1  PO2ART 64.5* 324.0*   Liver Enzymes  Recent Labs Lab 06/14/14 0112 06/14/14 0900  AST 23 16  ALT 14 11  ALKPHOS 89 67  BILITOT <0.2* <0.2*  ALBUMIN 3.2* 2.4*   Cardiac Enzymes  Recent Labs Lab 06/14/14 0900  TROPONINI <0.30   Glucose  Recent Labs Lab 06/14/14 0624  GLUCAP  275*    Imaging No results found.   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  Doubt solely r/t GI bleeding.  P:   RASS goal:  -1 Daily SBT, WUA  Sedation protocol while on vent  Stat CT head  Check ammonia    Family updated:  No family available 10/17 - updated by RN on tx to ICU    TODAY'S SUMMARY:     I have personally obtained a history, examined the patient, evaluated laboratory and imaging results, formulated the assessment and plan and placed orders. CRITICAL CARE: The patient is critically ill with multiple organ systems failure and requires high complexity decision making for assessment  and support, frequent evaluation and titration of therapies, application of advanced monitoring technologies and extensive interpretation of multiple databases. Critical Care Time devoted to patient care services described in this note is 40 minutes.   Merton Border, MD ; [facility] [phone].  After 5:30 PM or weekends, call  [phone]   06/14/2014  10:38 AM

## 2014-06-14 NOTE — ED Provider Notes (Signed)
CSN: [id number]     Arrival date & time 06/14/14  0047 History   First MD Initiated Contact with Patient 06/14/14 0059     Chief Complaint  Patient presents with  . Rectal Bleeding    Patient is a 68 y.o. female presenting with hematochezia. The history is provided by the patient.  Rectal Bleeding Quality:  Bright red Amount:  Copious Duration:  1 hour Timing:  Constant Chronicity:  Recurrent (She had an episode in the past where she was admitted for intestinal bleeding but it was a while ago) Context: not constipation, not hemorrhoids and not rectal pain   Relieved by:  Nothing Worsened by:  Nothing tried Associated symptoms: light-headedness   Associated symptoms: no abdominal pain, no epistaxis, no fever, no loss of consciousness and no vomiting   Risk factors: no anticoagulant use, no hx of colorectal surgery and no liver disease     Past Medical History  Diagnosis Date  . Alcoholic cirrhosis   . Reflux esophagitis   . Gastroesophageal reflux   . Colon polyps   . CHF (congestive heart failure) 10/13    grade 1 diastolic dysfunction, Nl LVF  . Hypertension   . History of GI diverticular bleed   . Pneumonia     history of  . Asthma   . Anemia   . Blood transfusion few years ago  . Villous adenoma of colon 05/10/11  . PVD (peripheral vascular disease) 2009    bilat iliac stenting  . Chronic obstructive pulmonary disease (COPD)   . Arthritis   . Glaucoma     left  . Gout   . Acute venous embolism and thrombosis of deep vessels of proximal lower extremity   . Back pain   . Leg swelling   . Diabetes mellitus     insulin dep   . Hepatitis     alcoholic hepatitis  . Depression   . Anxiety   . On home oxygen therapy oxygen 2 liter per minute prn per [place]   Past Surgical History  Procedure Laterality Date  . Cataract extraction Right yrs ago  . Colonoscopy    . Laparoscopic assissted total colectomy w/ j-pouch  04/22/11  . Ventral hernia repair  06/13/2012   Procedure: LAPAROSCOPIC VENTRAL HERNIA;  Surgeon: Adin Hector, MD;  Location: [place];  Service: General;  Laterality: N/A;  Laparoscopic Assisted Ventral Hernia with Mesh  . Abdominal hysterectomy  yrs ago  . Esophagogastroduodenoscopy (egd) with propofol N/A 08/06/2013    Procedure: ESOPHAGOGASTRODUODENOSCOPY (EGD) WITH PROPOFOL;  Surgeon: Lear Ng, MD;  Location: [facility];  Service: Endoscopy;  Laterality: N/A;  . Colonoscopy with propofol N/A 08/06/2013    Procedure: COLONOSCOPY WITH PROPOFOL;  Surgeon: Lear Ng, MD;  Location: [facility];  Service: Endoscopy;  Laterality: N/A;   Family History  Problem Relation Age of Onset  . Cancer Brother     heent ca  . Cancer Sister     liver ca   History  Substance Use Topics  . Smoking status: Former Smoker -- 0.50 packs/day for 30 years    Types: Cigarettes    Quit date: 11/06/2005  . Smokeless tobacco: Never Used  . Alcohol Use: No     Comment: stopped 2005   OB History   Grav Para Term Preterm Abortions TAB SAB Ect Mult Living                 Review of Systems  Constitutional: Negative for  fever.  HENT: Negative for nosebleeds.   Gastrointestinal: Positive for hematochezia. Negative for vomiting and abdominal pain.  Neurological: Positive for light-headedness. Negative for loss of consciousness.  All other systems reviewed and are negative.     Allergies  Penicillins and Lipitor  Home Medications   Prior to Admission medications   Medication Sig Start Date End Date Taking? Authorizing Provider  albuterol (PROVENTIL HFA;VENTOLIN HFA) 108 (90 BASE) MCG/ACT inhaler Inhale 2 puffs into the lungs every 6 (six) hours as needed. For shortness of breath    Historical Provider, MD  albuterol (PROVENTIL) (2.5 MG/3ML) 0.083% nebulizer solution Take 2.5 mg by nebulization every 6 (six) hours as needed for wheezing or shortness of breath.    Historical Provider, MD  aspirin 81 MG EC tablet Take 81 mg by  mouth daily.      Historical Provider, MD  cilostazol (PLETAL) 100 MG tablet Take 50 mg by mouth 2 (two) times daily before a meal.     Historical Provider, MD  diclofenac sodium (VOLTAREN) 1 % GEL Apply 1 application topically daily as needed. For leg pain    Historical Provider, MD  diphenhydrAMINE (BENADRYL) 25 mg capsule Take 25 mg by mouth at bedtime as needed. For cough    Historical Provider, MD  ergocalciferol (VITAMIN D2) 50000 UNITS capsule Take 50,000 Units by mouth once a week. On Monday    Historical Provider, MD  fluticasone (FLONASE) 50 MCG/ACT nasal spray Place 2 sprays into the nose daily. 50 mcg /act 2 sprays nasal once a day     Historical Provider, MD  folic acid (FOLVITE) 1 MG tablet Take 1 mg by mouth daily.    Historical Provider, MD  furosemide (LASIX) 40 MG tablet Take 40 mg by mouth every morning.    Historical Provider, MD  glimepiride (AMARYL) 4 MG tablet Take 4 mg by mouth Daily.  06/04/12   Historical Provider, MD  insulin glargine (LANTUS SOLOSTAR) 100 UNIT/ML injection Inject 10 Units into the skin at bedtime.     Historical Provider, MD  lidocaine (LIDODERM) 5 % Place 1 patch onto the skin daily. Remove & Discard patch within 12 hours or as directed by MD     Historical Provider, MD  methocarbamol (ROBAXIN) 500 MG tablet Take 500 mg by mouth 2 (two) times daily.    Historical Provider, MD  metoprolol (LOPRESSOR) 50 MG tablet Take 0.5 tablets (25 mg total) by mouth 2 (two) times daily. 05/26/14   [name], MD  montelukast (SINGULAIR) 10 MG tablet Take 10 mg by mouth at bedtime.     Historical Provider, MD  omeprazole (PRILOSEC) 40 MG capsule Take 40 mg by mouth daily.     Historical Provider, MD  oxyCODONE (OXY IR/ROXICODONE) 5 MG immediate release tablet Take 5 mg by mouth daily as needed for moderate pain.  03/13/13   Historical Provider, MD  OXYGEN-HELIUM IN Inhale into the lungs. 2 liters nightly    Historical Provider, MD  Polyethylene Glycol 400 (BLINK  TEARS OP) Apply to eye as needed.    Historical Provider, MD  potassium chloride (K-DUR,KLOR-CON) 10 MEQ tablet Take 10 mEq by mouth 2 (two) times daily.  03/08/13   Historical Provider, MD  sitaGLIPtan (JANUVIA) 50 MG tablet Take 50 mg by mouth daily.     Historical Provider, MD  temazepam (RESTORIL) 30 MG capsule Take 30 mg by mouth at bedtime as needed. For sleep    Historical Provider, MD  triamcinolone cream (KENALOG)  0.5 % Apply 1 application topically daily as needed. For legs    Historical Provider, MD   BP 157/87  Pulse 88  Temp(Src) 98.5 F (36.9 C) (Oral)  Resp 18  SpO2 100% Physical Exam  Nursing note and vitals reviewed. Constitutional: She appears well-developed and well-nourished. No distress.  HENT:  Head: Normocephalic and atraumatic.  Right Ear: External ear normal.  Left Ear: External ear normal.  Eyes: Conjunctivae are normal. Right eye exhibits no discharge. Left eye exhibits no discharge. No scleral icterus.  Neck: Neck supple. No tracheal deviation present.  Cardiovascular: Normal rate, regular rhythm and intact distal pulses.   Pulmonary/Chest: Effort normal and breath sounds normal. No stridor. No respiratory distress. She has no wheezes. She has no rales.  Abdominal: Soft. Bowel sounds are normal. She exhibits no distension. There is no tenderness. There is no rebound and no guarding.  Protuberant   Genitourinary:  Copious amount of clotted blood in the commode  Musculoskeletal: She exhibits no edema and no tenderness.  Neurological: She is alert. She has normal strength. No cranial nerve deficit (no facial droop, extraocular movements intact, no slurred speech) or sensory deficit. She exhibits normal muscle tone. She displays no seizure activity. Coordination normal.  Skin: Skin is warm and dry. No rash noted.  Psychiatric: She has a normal mood and affect.    ED Course  Procedures (including critical care time) Labs Review Labs Reviewed  CBC -  Abnormal; Notable for the following:    WBC 12.1 (*)    Hemoglobin 11.4 (*)    MCH 25.4 (*)    All other components within normal limits  COMPREHENSIVE METABOLIC PANEL - Abnormal; Notable for the following:    Glucose, Bld 238 (*)    Creatinine, Ser 1.32 (*)    Albumin 3.2 (*)    Total Bilirubin <0.2 (*)    GFR calc non Af Amer 40 (*)    GFR calc Af Amer 47 (*)    All other components within normal limits  BASIC METABOLIC PANEL - Abnormal; Notable for the following:    Glucose, Bld 264 (*)    Creatinine, Ser 1.20 (*)    GFR calc non Af Amer 45 (*)    GFR calc Af Amer 52 (*)    All other components within normal limits  CBC - Abnormal; Notable for the following:    WBC 11.7 (*)    Hemoglobin 10.0 (*)    HCT 32.1 (*)    MCH 25.2 (*)    All other components within normal limits  GLUCOSE, CAPILLARY - Abnormal; Notable for the following:    Glucose-Capillary 275 (*)    All other components within normal limits  URINE CULTURE  APTT  PROTIME-INR  URINALYSIS, ROUTINE W REFLEX MICROSCOPIC  TYPE AND SCREEN     MDM   Final diagnoses:  Gastrointestinal hemorrhage associated with anorectal source    Likely recurrent diverticular bleed.  Remains heme stable.  Admit for further treatment, monitoring.    Dorie Rank, MD 06/14/14 [phone]

## 2014-06-14 NOTE — ED Notes (Signed)
Last BM thursday (normal), rectal bleeding onset Friday 2330 (3 episodes: 2 at home  And 1 upon arrival to ED), last at Friday night 2300 PTA. abd soft NT. (denies: pain, sob, nvd, fever dizziness or other sx).

## 2014-06-14 NOTE — Progress Notes (Signed)
Patient seen at approximately 8:20 AM. When seen after arrival to floor.  Hospitalist cross covering for partner. Patient is a 68 year old female past medical history of chronic kidney disease and cirrhosis of the liver who was being admitted for bright red blood per rectum. Labs initially the emergency room were reported stable. After arrival to the floor, a family member witnessed patient started having decreased level of responsiveness and rigors. Also started having significantly increase in abdominal distention. Patient's blood pressure dropped into the 80s.   Was called for assessment. Patient started on IV fluids and somewhat lethargic. Responded to sternal rub. Blood pressures improved to the 90s. Discuss of care and plan to transfer to ICU. Labs ordered including ammonia, CBC, ABG, seeing that and troponin. Patient developed another decreased responsiveness episode was witnessed tremors of all 4 extremities. Pressure dropped into the 50s. Abdomen noted more protuberant. Patient was reclined back and actually became more awake somewhat drowsy, but more interactive. Pressures improved and the 80s and patient transferred to ICU.

## 2014-06-14 NOTE — Progress Notes (Signed)
ANTIBIOTIC CONSULT NOTE - INITIAL  Pharmacy Consult for Vancomcyin Indication: rule out pneumonia  Allergies  Allergen Reactions  . Penicillins Hives  . Lipitor [Atorvastatin] Swelling    Patient Measurements: Height: 5' (152.4 cm) Weight: 150 lb 9.2 oz (68.3 kg) IBW/kg (Calculated) : 45.5  Vital Signs: Temp: 98.6 F (37 C) (10/17 0828) Temp Source: Oral (10/17 0828) BP: 178/95 mmHg (10/17 1045) Pulse Rate: 86 (10/17 1045) Intake/Output from previous day: 10/16 0701 - 10/17 0700 In: 250 [I.V.:250] Out: 1 [Stool:1] Intake/Output from this shift:    Labs:  Recent Labs  06/14/14 0112 06/14/14 0455 06/14/14 0900  WBC 12.1* 11.7* 12.1*  HGB 11.4* 10.0* 8.6*  PLT 321 279 262  CREATININE 1.32* 1.20* 1.30*   Estimated Creatinine Clearance: 35.2 ml/min (by C-G formula based on Cr of 1.3). No results found for this basename: VANCOTROUGH, VANCOPEAK, VANCORANDOM, GENTTROUGH, GENTPEAK, GENTRANDOM, TOBRATROUGH, TOBRAPEAK, TOBRARND, AMIKACINPEAK, AMIKACINTROU, AMIKACIN,  in the last 72 hours   Microbiology: No results found for this or any previous visit (from the past 720 hour(s)).  Medical History: Past Medical History  Diagnosis Date  . Alcoholic cirrhosis   . Reflux esophagitis   . Gastroesophageal reflux   . Colon polyps   . CHF (congestive heart failure) 10/13    grade 1 diastolic dysfunction, Nl LVF  . Hypertension   . History of GI diverticular bleed   . Pneumonia     history of  . Asthma   . Anemia   . Blood transfusion few years ago  . Villous adenoma of colon 05/10/11  . PVD (peripheral vascular disease) 2009    bilat iliac stenting  . Chronic obstructive pulmonary disease (COPD)   . Arthritis   . Glaucoma     left  . Gout   . Acute venous embolism and thrombosis of deep vessels of proximal lower extremity   . Back pain   . Leg swelling   . Diabetes mellitus     insulin dep   . Hepatitis     alcoholic hepatitis  . Depression   . Anxiety   .  On home oxygen therapy oxygen 2 liter per minute prn per [place]    Assessment: 36 YOF who presented on 10/17 with BRBPR, developed AMS, required intubation and transfer to the ICU. CXR shows possible infiltrate and pharmacy was consulted to start Vancomycin + Levaquin for empiric coverage. SCr 1.3, CrCl~30-40 ml/min.   Goal of Therapy:  Vancomycin trough level 15-20 mcg/ml Proper antibiotics for infection/cultures adjusted for renal/hepatic function   Plan:  1. Vancomycin 1g IV x 1 dose now followed by 1g IV every 24 hours 2. Adjust Levaquin to 750 mg IV every 48 hours 3. Will continue to follow renal function, culture results, LOT, and antibiotic de-escalation plans   Alycia Rossetti, PharmD, BCPS Clinical Pharmacist Pager: [phone] 06/14/2014 10:55 AM

## 2014-06-14 NOTE — Consult Note (Signed)
Referring Provider:  Dr. Merton Border Primary Care Physician:  Philis Fendt, MD Primary Gastroenterologist:  Dr. Michail Sermon  Reason for Consultation:  Hematochezia  HPI: [name] is a 68 y.o. female who was admitted through the emergency room earlier today with a history of bright red blood per rectum and became somewhat unresponsive and lethargic on the floor this morning, prompting transfer to the ICU and intubation. There has been no further hematochezia since coming to the ICU 5 hours ago, the patient's vital signs are excellent apart from transient bradycardia down into the 50s, specifically no shock per hypotension, and her NG drainage is clear. Since admission, there has been a decline in her hemoglobin from 11.4-8.6, although much of this may be due to dilution from IV fluids.   Because she is currently intubated, I am unable to obtain a verbal history. However, from chart review, it does not appear that this patient has any prior history of GI bleeding.  The patient carries a diagnosis of "cirrhosis" and a recent ultrasound was thought to show a shrunken liver; however, there was no splenomegaly noted in that exam, and endoscopy last December by Dr. Cannon Kettle showed no evidence of varices. Moreover, the patient has a consistently normal platelet count and a CT scan 2 years ago did not show changes suggestive of cirrhosis. Therefore, I think this diagnosis is somewhat uncertain, and in any event, there has been no evidence of portal hypertension.  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 Followup colonoscopy in December 2014 showed left-sided diverticulosis. The patient has a previous history of reflux esophagitis in 2011 but none was present on her more recent endoscopy last December.  There is radiographic evidence, on prior CT in 2013, of pancreatic calcifications consistent with chronic  pancreatitis.   Past Medical History  Diagnosis Date  . Alcoholic cirrhosis   . Reflux esophagitis   . Gastroesophageal reflux   . Colon polyps   . CHF (congestive heart failure) 10/13    grade 1 diastolic dysfunction, Nl LVF  . Hypertension   . History of GI diverticular bleed   . Pneumonia     history of  . Asthma   . Anemia   . Blood transfusion few years ago  . Villous adenoma of colon 05/10/11  . PVD (peripheral vascular disease) 2009    bilat iliac stenting  . Chronic obstructive pulmonary disease (COPD)   . Arthritis   . Glaucoma     left  . Gout   . Acute venous embolism and thrombosis of deep vessels of proximal lower extremity   . Back pain   . Leg swelling   . Diabetes mellitus     insulin dep   . Hepatitis     alcoholic hepatitis  . Depression   . Anxiety   . On home oxygen therapy oxygen 2 liter per minute prn per [place]    Past Surgical History  Procedure Laterality Date  . Cataract extraction Right yrs ago  . Colonoscopy    . Laparoscopic assissted total colectomy w/ j-pouch  04/22/11  . Ventral hernia repair  06/13/2012    Procedure: LAPAROSCOPIC VENTRAL HERNIA;  Surgeon: Adin Hector, MD;  Location: [facility];  Service: General;  Laterality: N/A;  Laparoscopic Assisted Ventral Hernia with Mesh  . Abdominal hysterectomy  yrs ago  . Esophagogastroduodenoscopy (egd) with propofol N/A 08/06/2013    Procedure: ESOPHAGOGASTRODUODENOSCOPY (EGD) WITH  PROPOFOL;  Surgeon: Lear Ng, MD;  Location: [facility];  Service: Endoscopy;  Laterality: N/A;  . Colonoscopy with propofol N/A 08/06/2013    Procedure: COLONOSCOPY WITH PROPOFOL;  Surgeon: Lear Ng, MD;  Location: [facility];  Service: Endoscopy;  Laterality: N/A;    Prior to Admission medications   Medication Sig Start Date End Date Taking? Authorizing Provider  albuterol (PROVENTIL HFA;VENTOLIN HFA) 108 (90 BASE) MCG/ACT inhaler Inhale 2 puffs into the lungs every 6 (six) hours as  needed. For shortness of breath   Yes Historical Provider, MD  albuterol (PROVENTIL) (2.5 MG/3ML) 0.083% nebulizer solution Take 2.5 mg by nebulization every 6 (six) hours as needed for wheezing or shortness of breath.   Yes Historical Provider, MD  allopurinol (ZYLOPRIM) 100 MG tablet Take 100 mg by mouth 2 (two) times daily.   Yes Historical Provider, MD  aspirin 81 MG EC tablet Take 81 mg by mouth daily.     Yes Historical Provider, MD  cilostazol (PLETAL) 100 MG tablet Take 50 mg by mouth 2 (two) times daily before a meal.    Yes Historical Provider, MD  diclofenac sodium (VOLTAREN) 1 % GEL Apply 1 application topically daily as needed (leg pain). For leg pain   Yes Historical Provider, MD  diphenhydrAMINE (BENADRYL) 25 mg capsule Take 25 mg by mouth at bedtime as needed. For cough   Yes Historical Provider, MD  ergocalciferol (VITAMIN D2) 50000 UNITS capsule Take 50,000 Units by mouth once a week. On Monday   Yes Historical Provider, MD  fluticasone (FLONASE) 50 MCG/ACT nasal spray Place 2 sprays into the nose daily. 50 mcg /act 2 sprays nasal once a day    Yes Historical Provider, MD  folic acid (FOLVITE) 1 MG tablet Take 1 mg by mouth daily.   Yes Historical Provider, MD  furosemide (LASIX) 40 MG tablet Take 40 mg by mouth every morning.   Yes Historical Provider, MD  glimepiride (AMARYL) 4 MG tablet Take 4 mg by mouth Daily.  06/04/12  Yes Historical Provider, MD  Insulin Detemir (LEVEMIR FLEXTOUCH) 100 UNIT/ML Pen Inject 8 Units into the skin daily at 10 pm.   Yes Historical Provider, MD  lidocaine (LIDODERM) 5 % Place 1 patch onto the skin daily as needed (pain). Remove & Discard patch within 12 hours or as directed by MD   Yes Historical Provider, MD  methocarbamol (ROBAXIN) 500 MG tablet Take 500 mg by mouth daily as needed for muscle spasms.    Yes Historical Provider, MD  metoprolol (LOPRESSOR) 50 MG tablet Take 0.5 tablets (25 mg total) by mouth 2 (two) times daily. 05/26/14  Yes [name], MD  montelukast (SINGULAIR) 10 MG tablet Take 10 mg by mouth at bedtime.    Yes Historical Provider, MD  omeprazole (PRILOSEC) 40 MG capsule Take 40 mg by mouth daily.    Yes Historical Provider, MD  oxyCODONE (OXY IR/ROXICODONE) 5 MG immediate release tablet Take 5 mg by mouth daily as needed for moderate pain.  03/13/13  Yes Historical Provider, MD  OXYGEN-HELIUM IN Inhale into the lungs. 2 liters nightly   Yes Historical Provider, MD  Polyethylene Glycol 400 (BLINK TEARS OP) Place 1 drop into the right eye daily as needed (dryness).    Yes Historical Provider, MD  sitaGLIPtan (JANUVIA) 50 MG tablet Take 50 mg by mouth daily.    Yes Historical Provider, MD  triamcinolone cream (KENALOG) 0.5 % Apply 1 application topically daily as needed. For  legs   Yes Historical Provider, MD    Current Facility-Administered Medications  Medication Dose Route Frequency Provider Last Rate Last Dose  . albuterol (PROVENTIL) (2.5 MG/3ML) 0.083% nebulizer solution 2.5 mg  2.5 mg Nebulization Q6H PRN Theodis Blaze, MD      . etomidate (AMIDATE) injection 16.5-36 mg  16.5-36 mg Intravenous PRN Wilhelmina Mcardle, MD      . fentaNYL (SUBLIMAZE) 2,500 mcg in sodium chloride 0.9 % 250 mL (10 mcg/mL) infusion  0-400 mcg/hr Intravenous Continuous Wilhelmina Mcardle, MD 7.5 mL/hr at 06/14/14 1413 75 mcg/hr at 06/14/14 1413  . fentaNYL (SUBLIMAZE) bolus via infusion 25-50 mcg  25-50 mcg Intravenous Q1H PRN Wilhelmina Mcardle, MD      . fentaNYL (SUBLIMAZE) injection 50 mcg  50 mcg Intravenous Once Wilhelmina Mcardle, MD      . folic acid (FOLVITE) tablet 1 mg  1 mg Oral Daily Theodis Blaze, MD      . insulin aspart (novoLOG) injection 0-15 Units  0-15 Units Subcutaneous 6 times per day Marijean Heath, NP      . ipratropium-albuterol (DUONEB) 0.5-2.5 (3) MG/3ML nebulizer solution 3 mL  3 mL Nebulization Q6H Marijean Heath, NP   3 mL at 06/14/14 1245  . levofloxacin (LEVAQUIN) IVPB 750 mg  750 mg Intravenous Q48H  Wilhelmina Mcardle, MD   750 mg at 06/14/14 1146  . ondansetron (ZOFRAN) tablet 4 mg  4 mg Oral Q6H PRN Theodis Blaze, MD       Or  . ondansetron [facility]) injection 4 mg  4 mg Intravenous Q6H PRN Theodis Blaze, MD      . pantoprazole (PROTONIX) injection 40 mg  40 mg Intravenous Q24H Marijean Heath, NP   40 mg at 06/14/14 1140  . rocuronium (ZEMURON) injection 55-120 mg  55-120 mg Intravenous PRN Wilhelmina Mcardle, MD      . sodium chloride 0.9 % injection 3 mL  3 mL Intravenous Q12H Theodis Blaze, MD      . Derrill Memo ON 06/15/2014] vancomycin (VANCOCIN) IVPB 1000 mg/200 mL premix  1,000 mg Intravenous Q24H Rolla Flatten, [facility]        Allergies as of 06/14/2014 - Review Complete 06/14/2014  Allergen Reaction Noted  . Penicillins Hives 01/28/2011  . Lipitor [atorvastatin] Swelling 04/24/2014    Family History  Problem Relation Age of Onset  . Cancer Brother     heent ca  . Cancer Sister     liver ca    History   Social History  . Marital Status: Single    Spouse Name: N/A    Number of Children: N/A  . Years of Education: N/A   Occupational History  . Not on file.   Social History Main Topics  . Smoking status: Former Smoker -- 0.50 packs/day for 30 years    Types: Cigarettes    Quit date: 11/06/2005  . Smokeless tobacco: Never Used  . Alcohol Use: No     Comment: stopped 2005  . Drug Use: No  . Sexual Activity: Not on file   Other Topics Concern  . Not on file   Social History Narrative  . No narrative on file    Review of Systems: Unobtainable from this intubated patient  Physical Exam: Vital signs in last 24 hours: Temp:  [97.5 F (36.4 C)-98.8 F (37.1 C)] 97.8 F (36.6 C) (10/17 1415) Pulse Rate:  [64-97] 79 (10/17 1415) Resp:  [8-26]  13 (10/17 1415) BP: (87-188)/(48-123) 102/51 mmHg (10/17 1415) SpO2:  [92 %-100 %] 100 % (10/17 1415) FiO2 (%):  [1 %-60 %] 60 % (10/17 1246) Weight:  [68.3 kg (150 lb 9.2 oz)] 68.3 kg (150 lb 9.2 oz) (10/17  0433) Last BM Date: 06/14/14 General:   Alert, intubated, well-developed, well-nourished, , with facial quivering but no overt rigors or seizure activity. Head:  Normocephalic and atraumatic. Eyes:  Sclera clear, no icterus.   Conjunctiva pink. Neck:   No masses or thyromegaly. Lungs:  Clear throughout to auscultation.   No wheezes, crackles, or rhonchi. No evident respiratory distress. Heart:   Regular rate and rhythm; no murmurs, clicks, rubs,  or gallops. Abdomen:  Soft, nontender, somewhat adipose but nondistended, mild tympany. No masses, hepatosplenomegaly or ventral hernias noted. Normal bowel sounds, without bruits, guarding, or rebound.  No obvious ascites. Msk:   Symmetrical without gross deformities. Pulses:  Normal radial pulse noted. Extremities:   Without clubbing, cyanosis, or edema. Neurologic:  Alert and coherent;  grossly normal neurologically apart from facial quivering. Skin:  Intact without significant lesions or rashes. Cervical Nodes:  No significant cervical adenopathy. Psych: Unable to assess while intubated  Intake/Output from previous day: 10/16 0701 - 10/17 0700 In: 250 [I.V.:250] Out: 1 [Stool:1] Intake/Output this shift: Total I/O In: 260.7 [I.V.:0.7; Blood:60; IV Piggyback:200] Out: 425 [Urine:425]  Lab Results:  Recent Labs  06/14/14 0112 06/14/14 0455 06/14/14 0900  WBC 12.1* 11.7* 12.1*  HGB 11.4* 10.0* 8.6*  HCT 37.3 32.1* 27.5*  PLT 321 279 262   BMET  Recent Labs  06/14/14 0455 06/14/14 0900 06/14/14 1027  NA 138 142 138  K 4.2 3.7 4.1  CL 106 111 106  CO2 20 19 20   GLUCOSE 264* 150* 212*  BUN 18 18 16   CREATININE 1.20* 1.30* 1.11*  CALCIUM 8.9 8.2* 8.2*   LFT  Recent Labs  06/14/14 0900  PROT 5.5*  ALBUMIN 2.4*  AST 16  ALT 11  ALKPHOS 67  BILITOT <0.2*   PT/INR  Recent Labs  06/14/14 0112  LABPROT 14.4  INR 1.11    Studies/Results: US Abdomen Port  06/14/2014   CLINICAL DATA:  History of cirrhosis.  Altered mental status. Hypotension and abdominal distention. Gastrointestinal bleeding.  EXAM: ULTRASOUND PORTABLE ABDOMEN  COMPARISON:  CT abdomen and pelvis 01/13/2012. MRI abdomen 02/17/2012.  FINDINGS: Gallbladder: No gallstones or wall thickening visualized. No sonographic Murphy sign noted.  Common bile duct: Diameter: 0.4 cm  Liver: The liver appears shrunken compatible with history of cirrhosis. No focal liver lesion is identified. There is hepatopetal flow of the portal vein.  IVC: No abnormality visualized.  Pancreas: Very poorly seen.  No focal lesion identified.  Spleen: Size and appearance within normal limits.  Right Kidney: Length: 9.9 cm. Cortical echogenicity is increased. No hydronephrosis. 1.1 cm simple cyst noted.  Left Kidney: Length: 9.9 cm. There is increased cortical echogenicity. No hydronephrosis or mass.  Abdominal aorta: No aneurysm visualized.  Other findings: None.  IMPRESSION: No acute abnormality.  Findings compatible with medical renal disease.  The liver appears cirrhotic.   Electronically Signed   By: Inge Rise M.D.   On: 06/14/2014 11:22   Dg Chest Port 1 View  06/14/2014   CLINICAL DATA:  Chronic cough  EXAM: PORTABLE CHEST - 1 VIEW  COMPARISON:  Radiograph 11/17/2012  FINDINGS: Normal cardiac silhouette. There is interval improvement bilateral pleural effusions from remote exam. There is left lower lobe segmental opacity. No  pneumothorax.  IMPRESSION: Left lower lobe opacity representing atelectasis or infiltrate. Recommend follow-up radiographs to ensure resolution.   Electronically Signed   By: Suzy Bouchard M.D.   On: 06/14/2014 08:45   Dg Chest Port 1v Same Day  06/14/2014   CLINICAL DATA:  Assess central line placement.  EXAM: PORTABLE CHEST - 1 VIEW SAME DAY  COMPARISON:  06/14/2014 at 6:38 a.m.  FINDINGS: Endotracheal tube has tip within the origin of the right mainstem bronchus. Nasogastric tube tip is not seen although the side port is over the stomach  in the left upper quadrant. Left IJ central venous catheter has tip overlying the SVC.  Lungs are hypoinflated with continued left basilar opacification likely atelectasis although cannot exclude early infection. No evidence of pneumothorax. Cannot exclude a smaller left pleural fluid. Mild prominence of the perihilar markings likely due in part to the degree of hypoinflation. Cardiomediastinal silhouette and remainder of the exam is unchanged.  IMPRESSION: Left basilar opacification which likely represents atelectasis although cannot exclude infection. Cannot exclude a small amount left pleural fluid.  Tubes and lines as described. Note that the endotracheal tube has tip over the origin of the right mainstem bronchus.  These results were called by telephone at the time of interpretation on 06/14/2014 at 10:52 am to patient's nurse, Jeronimo Greaves,, who verbally acknowledged these results.   Electronically Signed   By: Marin Olp M.D.   On: 06/14/2014 10:53    Impression: 1. Hematochezia, transient 2. Acute on chronic anemia, with apparent posthemorrhagic component 3. Lethargy and hypotension while on the floor earlier this morning 4. History of left-sided diverticulosis 5. History of "cirrhosis" of questionable accuracy, but no prior endoscopic or radiographic or biochemical evidence of portal hypertension 6. Status post right hemicolectomy for large villous adenoma in 2012  DISCUSSION: I would favor this being a diverticular bleed, since there has been no rise in BUN and the patient has no likely cause for upper tract bleeding (she does take a daily 81 mg aspirin, but is also on omeprazole 20 mg daily). Moreover, there is no blood from her NG tube.  Plan: 1. Close clinical monitoring 2. In the event of recurrent active bleeding, would consider a bleeding scan for localization 3. I do not think her sufficient suspicion of an upper tract source of bleeding in this patient to justify endoscopic  evaluation at the present time 4. With a history of known left-sided diverticular disease, and colonoscopy within the past year having shown no other sources of bleeding from the lower tract (such as vascular ectasia, colitis, masses, ulcerations, etc.), I do not feel that repeating the colonoscopy at this time would likely shed more light on the patient's problem. 5. We will continue to follow with you. Please call at anytime if she destabilizes, and/or more immediate input from [place] is needed.   LOS: 0 days   Tiondra Fang V  06/14/2014, 2:39 PM

## 2014-06-14 NOTE — Significant Event (Addendum)
Rapid Response Event Note  Overview: Time Called: 0813 Arrival Time: 0815 Event Type: Hypotension  Initial Focused Assessment:  Called by primary Rn for new admit from ED with GIB and hypotension.  Upon my arrival to patients room Rn and family in room.  Patient lying in bed, moaning minimally responsive.  As per family and RN patient suddenly went unresponsive and had seizure like activity on upper body.  ABD very distended, soft has red bloody stool with clots in the bed.  As per family since unresponsive episode, ABD has gotten much bigger.  BP 80's/40's, Spo2 90% placed on nasal cannula at 2 lpm. 250cc bolus given   Interventions:  Tele Monitor reviewed and show patient having multiple pauses. MD paged and updated.  ABG done.  Fluids opened wide.  Orders to transfer to ICU.  When getting patient ready for transfer, patient became unresponsive, HR dropped into the 30's, and abd started to get bigger.  Code Blue called and MD summoned to room.  Placed on zoll.  Patient was unresponsive for about 2-3 minutes and then came to.     Event Summary:  Patient transferred to 9M 14 on zoll, oxygen.  MD present on transfer    at      at          [facility]

## 2014-06-14 NOTE — H&P (Addendum)
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 Patient denies any abdominal pain, no nausea or vomiting. She reports similar episodes in the past that were due to diverticular bleed, she does explain that this time there is a lot more bleeding than with previous episodes. Patient also denies chest pain or shortness of breath, no fevers and chills, reports having endoscopy and colonoscopy in 2014 and was told she had duodenitis and as esophagitis.   In emergency department, patient is hemodynamically stable, vital signs stable. Had one large bloody bowel movement. [facility] hospitalist asked to admit for further evaluation.   Assessment and Plan: Active Problems:   GI bleeding - has history of diverticular bleed and also colonic polyp - Hg stable for now, since patient had another large bloody bowel movement will repeat CBC now - call GI in AM if bleeding persists and Hg drops - no indication for transfusion    Acute on chronic renal failure stage II  - secondary to use of lasix and bleeding as noted above - allow regular diet and hold IVF as pt is not dehydrated - hold lasix and repeat BMP in AM   Leukocytosis - unclear etiology, noted cough during the exam, productive of clear sputum - Will ask for chest x-ray  - urinalysis pending, patient denies any specific urinary symptoms  - repeat CBC in AM   Anemia of chronic disease  - Hg  stable for now   DM type II with complication of CKD stage II  - continue home medical regimen with oral medications and continue Insulin - add SSI    HTN - stable on admission    Chronic CHF diastolic, grade I - euvolemic on exam - weight on admission 150 lbs - monitor daly weight and I's and O's   SCDs for DVT prophylaxis  Radiological Exams on Admission: No results found.   Code Status: Full Family Communication: Pt at bedside Disposition Plan: Admit for further evaluation     Review of Systems:  Constitutional: Negative for diaphoresis.  HENT: Negative for hearing loss, ear pain, nosebleeds, congestion, sore throat, neck pain, tinnitus and ear discharge.   Eyes: Negative for blurred vision, double vision, photophobia, pain, discharge and redness.  Respiratory: Negative for hemoptysis, sputum production, shortness of breath, wheezing and stridor.   Cardiovascular: Negative for chest pain, palpitations, orthopnea, claudication and leg swelling.  Gastrointestinal: Negative for nausea, vomiting and abdominal pain.  Genitourinary: Negative for dysuria, urgency, frequency, hematuria and flank pain.  Musculoskeletal: Negative for myalgias, back pain, joint pain and falls.  Skin: Negative for itching and rash.  Neurological: Negative for dizziness and weakness. Endo/Heme/Allergies: Negative for environmental allergies and polydipsia. Does not bruise/bleed easily.  Psychiatric/Behavioral: Negative for suicidal ideas. The patient is not nervous/anxious.      Past Medical History  Diagnosis Date  . Alcoholic cirrhosis   . Reflux esophagitis   . Gastroesophageal reflux   . Colon polyps   . CHF (congestive heart failure) 10/13  grade 1 diastolic dysfunction, Nl LVF  . Hypertension   . History of GI diverticular bleed   . Pneumonia     history of  . Asthma   . Anemia   . Blood transfusion few years ago  . Villous adenoma of colon 05/10/11  . PVD (peripheral vascular  disease) 2009    bilat iliac stenting  . Chronic obstructive pulmonary disease (COPD)   . Arthritis   . Glaucoma     left  . Gout   . Acute venous embolism and thrombosis of deep vessels of proximal lower extremity   . Back pain   . Leg swelling   . Diabetes mellitus     insulin dep   . Hepatitis     alcoholic hepatitis  . Depression   . Anxiety   . On home oxygen therapy oxygen 2 liter per minute prn per [place]    Past Surgical History  Procedure Laterality Date  . Cataract extraction Right yrs ago  . Colonoscopy    . Laparoscopic assissted total colectomy w/ j-pouch  04/22/11  . Ventral hernia repair  06/13/2012    Procedure: LAPAROSCOPIC VENTRAL HERNIA;  Surgeon: Adin Hector, MD;  Location: [place];  Service: General;  Laterality: N/A;  Laparoscopic Assisted Ventral Hernia with Mesh  . Abdominal hysterectomy  yrs ago  . Esophagogastroduodenoscopy (egd) with propofol N/A 08/06/2013    Procedure: ESOPHAGOGASTRODUODENOSCOPY (EGD) WITH PROPOFOL;  Surgeon: Lear Ng, MD;  Location: [facility];  Service: Endoscopy;  Laterality: N/A;  . Colonoscopy with propofol N/A 08/06/2013    Procedure: COLONOSCOPY WITH PROPOFOL;  Surgeon: Lear Ng, MD;  Location: [facility];  Service: Endoscopy;  Laterality: N/A;    Social History:  reports that she quit smoking about 8 years ago. Her smoking use included Cigarettes. She has a 15 pack-year smoking history. She has never used smokeless tobacco. She reports that she does not drink alcohol or use illicit drugs.  Allergies  Allergen Reactions  . Penicillins Hives  . Lipitor [Atorvastatin] Swelling    Family History  Problem Relation Age of Onset  . Cancer Brother     heent ca  . Cancer Sister     liver ca    Prior to Admission medications   Medication Sig Start Date End Date Taking? Authorizing Provider  albuterol (PROVENTIL HFA;VENTOLIN HFA) 108 (90 BASE) MCG/ACT inhaler Inhale 2 puffs into the lungs every 6 (six)  hours as needed. For shortness of breath   Yes Historical Provider, MD  albuterol (PROVENTIL) (2.5 MG/3ML) 0.083% nebulizer solution Take 2.5 mg by nebulization every 6 (six) hours as needed for wheezing or shortness of breath.   Yes Historical Provider, MD  allopurinol (ZYLOPRIM) 100 MG tablet Take 100 mg by mouth 2 (two) times daily.   Yes Historical Provider, MD  aspirin 81 MG EC tablet Take 81 mg by mouth daily.     Yes Historical Provider, MD  cilostazol (PLETAL) 100 MG tablet Take 50 mg by mouth 2 (two) times daily before a meal.    Yes Historical Provider, MD  diclofenac sodium (VOLTAREN) 1 % GEL Apply 1 application topically daily as needed (leg pain). For leg pain   Yes Historical Provider, MD  diphenhydrAMINE (BENADRYL) 25 mg capsule Take 25 mg by mouth at bedtime as needed. For cough   Yes Historical Provider, MD  ergocalciferol (VITAMIN D2) 50000 UNITS capsule Take 50,000 Units by mouth once a week. On Monday   Yes Historical Provider,  MD  fluticasone (FLONASE) 50 MCG/ACT nasal spray Place 2 sprays into the nose daily. 50 mcg /act 2 sprays nasal once a day    Yes Historical Provider, MD  folic acid (FOLVITE) 1 MG tablet Take 1 mg by mouth daily.   Yes Historical Provider, MD  furosemide (LASIX) 40 MG tablet Take 40 mg by mouth every morning.   Yes Historical Provider, MD  glimepiride (AMARYL) 4 MG tablet Take 4 mg by mouth Daily.  06/04/12  Yes Historical Provider, MD  Insulin Detemir (LEVEMIR FLEXTOUCH) 100 UNIT/ML Pen Inject 8 Units into the skin daily at 10 pm.   Yes Historical Provider, MD  lidocaine (LIDODERM) 5 % Place 1 patch onto the skin daily as needed (pain). Remove & Discard patch within 12 hours or as directed by MD   Yes Historical Provider, MD  methocarbamol (ROBAXIN) 500 MG tablet Take 500 mg by mouth daily as needed for muscle spasms.    Yes Historical Provider, MD  metoprolol (LOPRESSOR) 50 MG tablet Take 0.5 tablets (25 mg total) by mouth 2 (two) times daily. 05/26/14  Yes  [name], MD  montelukast (SINGULAIR) 10 MG tablet Take 10 mg by mouth at bedtime.    Yes Historical Provider, MD  omeprazole (PRILOSEC) 40 MG capsule Take 40 mg by mouth daily.    Yes Historical Provider, MD  oxyCODONE (OXY IR/ROXICODONE) 5 MG immediate release tablet Take 5 mg by mouth daily as needed for moderate pain.  03/13/13  Yes Historical Provider, MD  OXYGEN-HELIUM IN Inhale into the lungs. 2 liters nightly   Yes Historical Provider, MD  Polyethylene Glycol 400 (BLINK TEARS OP) Place 1 drop into the right eye daily as needed (dryness).    Yes Historical Provider, MD  sitaGLIPtan (JANUVIA) 50 MG tablet Take 50 mg by mouth daily.    Yes Historical Provider, MD  triamcinolone cream (KENALOG) 0.5 % Apply 1 application topically daily as needed. For legs   Yes Historical Provider, MD    Physical Exam: Filed Vitals:   06/14/14 0145 06/14/14 0230 06/14/14 0245 06/14/14 0315  BP: 142/68 141/73 148/74 140/79  Pulse: 76 77 79 78  Temp:      TempSrc:      Resp: 13 17 25 17   SpO2: 96% 97% 95% 94%    Physical Exam  Constitutional: Appears well-developed and well-nourished. No distress.  HENT: Normocephalic. External right and left ear normal. Oropharynx is clear and moist.  Eyes: Conjunctivae and EOM are normal. PERRLA, no scleral icterus.  Neck: Normal ROM. Neck supple. No JVD. No tracheal deviation. No thyromegaly.  CVS: RRR, S1/S2 +, no gallops, no carotid bruit.  Pulmonary: Effort and breath sounds normal, no stridor, mild bibasilar rhonchi  Abdominal: Soft. BS +,  no distension, tenderness, rebound or guarding.  Musculoskeletal: Normal range of motion. Lymphadenopathy: No lymphadenopathy noted, cervical, inguinal. Neuro: Alert. Normal reflexes, muscle tone coordination. No cranial nerve deficit. Skin: Skin is warm and dry. No rash noted. Not diaphoretic. No erythema. No pallor.  Psychiatric: Normal mood and affect. Behavior, judgment, thought content normal.   Labs on  Admission:  Basic Metabolic Panel:  Recent Labs Lab 06/14/14 0112  NA 139  K 4.2  CL 104  CO2 23  GLUCOSE 238*  BUN 19  CREATININE 1.32*  CALCIUM 9.3   Liver Function Tests:  Recent Labs Lab 06/14/14 0112  AST 23  ALT 14  ALKPHOS 89  BILITOT <0.2*  PROT 7.5  ALBUMIN 3.2*  CBC:  Recent Labs Lab 06/14/14 0112  WBC 12.1*  HGB 11.4*  HCT 37.3  MCV 83.1  PLT 321    EKG: Normal sinus rhythm, no ST/T wave changes  Faye Ramsay, MD  [facility] Pager [phone]  If 7PM-7AM, please contact night-coverage [email or web address] Password [email or web address] 06/14/2014, 3:47 AM

## 2014-06-15 ENCOUNTER — Inpatient Hospital Stay (HOSPITAL_COMMUNITY): Payer: PRIVATE HEALTH INSURANCE

## 2014-06-15 LAB — GLUCOSE, CAPILLARY
GLUCOSE-CAPILLARY: 139 mg/dL — AB (ref 70–99)
Glucose-Capillary: 144 mg/dL — ABNORMAL HIGH (ref 70–99)
Glucose-Capillary: 145 mg/dL — ABNORMAL HIGH (ref 70–99)
Glucose-Capillary: 149 mg/dL — ABNORMAL HIGH (ref 70–99)
Glucose-Capillary: 170 mg/dL — ABNORMAL HIGH (ref 70–99)
Glucose-Capillary: 173 mg/dL — ABNORMAL HIGH (ref 70–99)

## 2014-06-15 LAB — LEGIONELLA ANTIGEN, URINE

## 2014-06-15 LAB — CBC
HCT: 29.5 % — ABNORMAL LOW (ref 36.0–46.0)
HCT: 30.9 % — ABNORMAL LOW (ref 36.0–46.0)
Hemoglobin: 9.6 g/dL — ABNORMAL LOW (ref 12.0–15.0)
Hemoglobin: 10.1 g/dL — ABNORMAL LOW (ref 12.0–15.0)
MCH: 26.1 pg (ref 26.0–34.0)
MCH: 26.4 pg (ref 26.0–34.0)
MCHC: 32.5 g/dL (ref 30.0–36.0)
MCHC: 32.7 g/dL (ref 30.0–36.0)
MCV: 80.2 fL (ref 78.0–100.0)
MCV: 80.9 fL (ref 78.0–100.0)
Platelets: 201 10*3/uL (ref 150–400)
Platelets: 227 10*3/uL (ref 150–400)
RBC: 3.68 MIL/uL — AB (ref 3.87–5.11)
RBC: 3.82 MIL/uL — ABNORMAL LOW (ref 3.87–5.11)
RDW: 15.3 % (ref 11.5–15.5)
RDW: 15.5 % (ref 11.5–15.5)
WBC: 13.5 10*3/uL — ABNORMAL HIGH (ref 4.0–10.5)
WBC: 15.9 10*3/uL — ABNORMAL HIGH (ref 4.0–10.5)

## 2014-06-15 LAB — BASIC METABOLIC PANEL
Anion gap: 11 (ref 5–15)
BUN: 15 mg/dL (ref 6–23)
CO2: 18 mEq/L — ABNORMAL LOW (ref 19–32)
Calcium: 7.5 mg/dL — ABNORMAL LOW (ref 8.4–10.5)
Chloride: 111 mEq/L (ref 96–112)
Creatinine, Ser: 1.12 mg/dL — ABNORMAL HIGH (ref 0.50–1.10)
GFR, EST AFRICAN AMERICAN: 57 mL/min — AB (ref >90)
GFR, EST NON AFRICAN AMERICAN: 49 mL/min — AB (ref >90)
Glucose, Bld: 174 mg/dL — ABNORMAL HIGH (ref 70–99)
POTASSIUM: 3.8 meq/L (ref 3.7–5.3)
SODIUM: 140 meq/L (ref 137–147)

## 2014-06-15 LAB — TROPONIN I

## 2014-06-15 LAB — URINE CULTURE
COLONY COUNT: NO GROWTH
CULTURE: NO GROWTH

## 2014-06-15 LAB — PROCALCITONIN

## 2014-06-15 MED ORDER — ALBUTEROL SULFATE (2.5 MG/3ML) 0.083% IN NEBU: 2.5000 mg | INHALATION_SOLUTION | RESPIRATORY_TRACT | Status: DC | PRN

## 2014-06-15 MED ORDER — FUROSEMIDE 40 MG PO TABS
40.0000 mg | ORAL_TABLET | Freq: Every day | ORAL | Status: DC
  Administered 2014-06-15: 40 mg
  Filled 2014-06-15 (×2): qty 1

## 2014-06-15 MED ORDER — PANTOPRAZOLE SODIUM 40 MG PO PACK
40.0000 mg | PACK | Freq: Every day | ORAL | Status: DC
  Administered 2014-06-15: 40 mg
  Filled 2014-06-15 (×2): qty 20

## 2014-06-15 MED ORDER — FUROSEMIDE 40 MG PO TABS
40.0000 mg | ORAL_TABLET | Freq: Every day | ORAL | Status: DC
  Filled 2014-06-15: qty 1

## 2014-06-15 MED ORDER — VITAL HIGH PROTEIN PO LIQD
1000.0000 mL | ORAL | Status: DC
  Administered 2014-06-15: 1000 mL
  Filled 2014-06-15 (×3): qty 1000

## 2014-06-15 MED ORDER — CLONIDINE HCL 0.1 MG PO TABS
0.1000 mg | ORAL_TABLET | Freq: Three times a day (TID) | ORAL | Status: DC
  Administered 2014-06-15 (×2): 0.1 mg
  Filled 2014-06-15 (×5): qty 1

## 2014-06-15 MED ORDER — FOLIC ACID 1 MG PO TABS
1.0000 mg | ORAL_TABLET | Freq: Every day | ORAL | Status: DC
  Filled 2014-06-15: qty 1

## 2014-06-15 MED ORDER — CLONIDINE HCL 0.1 MG PO TABS
0.1000 mg | ORAL_TABLET | Freq: Three times a day (TID) | ORAL | Status: DC
  Filled 2014-06-15 (×2): qty 1

## 2014-06-15 NOTE — Plan of Care (Signed)
Problem: Phase I Progression Outcomes Goal: Voiding-avoid urinary catheter unless indicated Outcome: Not Met (add Reason) Foley indicated for unstable patient

## 2014-06-15 NOTE — Plan of Care (Signed)
Problem: Consults Goal: Ventilated Patients Patient Education See Patient Education Module for education specifics. Outcome: Completed/Met Date Met:  06/15/14 With family  Problem: Phase I Progression Outcomes Goal: VTE prophylaxis Outcome: Completed/Met Date Met:  06/15/14 SCD's

## 2014-06-15 NOTE — Progress Notes (Signed)
PULMONARY / CRITICAL CARE MEDICINE   Name: [name] MRN: [id number] DOB: 1945/10/17    ADMISSION DATE:  06/14/2014 CONSULTATION DATE:  06/14/14  REFERRING MD :  [place] Pink   CHIEF COMPLAINT:  AMS  INITIAL PRESENTATION: 68yo female with hx COPD, DM, CKD, hx colon ca s/p resection 2012, ETOH cirrhosis presented 10/17 with BRBPR.  She was admitted by [facility] to floor and quickly deteriorated with AMS, requiring tx to ICU and intubation.    STUDIES:  CT head 10/17>>> neg acute  abd u/s 10/17>>> neg acute, cirrhotic liver, medical renal disease   SIGNIFICANT EVENTS:   SUBJECTIVE:  Awake. Denies pain.  No further bleeding, hgb stable.  Failed SBT r/t tachycardia, tachypnea.   VITAL SIGNS: Temp:  [97.7 F (36.5 C)-99 F (37.2 C)] 99 F (37.2 C) (10/18 1125) Pulse Rate:  [67-142] 103 (10/18 1100) Resp:  [11-26] 13 (10/18 1100) BP: (59-179)/(43-92) 117/66 mmHg (10/18 1100) SpO2:  [98 %-100 %] 98 % (10/18 1100) FiO2 (%):  [40 %-60 %] 40 % (10/18 1010) HEMODYNAMICS: CVP:  [16 mmHg] 16 mmHg VENTILATOR SETTINGS: Vent Mode:  [-] PRVC FiO2 (%):  [40 %-60 %] 40 % Set Rate:  [14 bmp] 14 bmp Vt Set:  [500 mL] 500 mL PEEP:  [5 cmH20] 5 cmH20 Pressure Support:  [10 cmH20] 10 cmH20 Plateau Pressure:  [16 cmH20-22 cmH20] 19 cmH20 INTAKE / OUTPUT:  Intake/Output Summary (Last 24 hours) at 06/15/14 1218 Last data filed at 06/15/14 1000  Gross per 24 hour  Intake 2628.5 ml  Output   1396 ml  Net 1232.5 ml    PHYSICAL EXAMINATION: General:  Chronically ill appearing female, NAD on vent  Neuro:  Awake, alert, nods appropriately, MAE, follows commands  HEENT:  Mm pale, dry, no JVD, sclera anicteric  Cardiovascular:  s1s2 rrr, mild tachy  Lungs:  resps even, non labored on vent, failed SBT, few scattered wheeze  Abdomen:  Round, mildly distended but soft, hypoactive BS  Musculoskeletal:  Warm and dry, no edema   LABS:  CBC  Recent Labs Lab 06/14/14 1900 06/15/14 0409  06/15/14 0925  WBC 14.1* 13.5* 15.9*  HGB 10.1* 9.6* 10.1*  HCT 31.5* 29.5* 30.9*  PLT 226 201 227   Coag's  Recent Labs Lab 06/14/14 0112  APTT 36  INR 1.11   BMET  Recent Labs Lab 06/14/14 0900 06/14/14 1027 06/15/14 0409  NA 142 138 140  K 3.7 4.1 3.8  CL 111 106 111  CO2 19 20 18*  BUN 18 16 15   CREATININE 1.30* 1.11* 1.12*  GLUCOSE 150* 212* 174*   Electrolytes  Recent Labs Lab 06/14/14 0900 06/14/14 1027 06/15/14 0409  CALCIUM 8.2* 8.2* 7.5*   Sepsis Markers  Recent Labs Lab 06/14/14 1037 06/15/14 0409  PROCALCITON <0.10 <0.10   ABG  Recent Labs Lab 06/14/14 0843 06/14/14 1005  PHART 7.322* 7.342*  PCO2ART 41.7 41.1  PO2ART 64.5* 324.0*   Liver Enzymes  Recent Labs Lab 06/14/14 0112 06/14/14 0900  AST 23 16  ALT 14 11  ALKPHOS 89 67  BILITOT <0.2* <0.2*  ALBUMIN 3.2* 2.4*   Cardiac Enzymes  Recent Labs Lab 06/14/14 1027 06/14/14 1627 06/15/14 0103  TROPONINI <0.30 <0.30 <0.30   Glucose  Recent Labs Lab 06/14/14 1130 06/14/14 1626 06/14/14 2040 06/15/14 0022 06/15/14 0425 06/15/14 0753  GLUCAP 116* 186* 139* 144* 149* 139*    Imaging CXR 10/18>> bilat atx   ASSESSMENT / PLAN:  PULMONARY OETT  10/17>>> Acute  respiratory failure - r/t AMS  Hx COPD - no evidence acute exacerbation  ?CAP P:   Cont vent support  Daily SBT  F/u CXR  Scheduled BD's    CARDIOVASCULAR CVL  L IJ CVL 10/17>>> Hypotension - hypovolemia r/t blood loss - improved.  Trop neg.  Hx HTN  Hx dCHF Bradycardia - intermittent - resolved.  ?? R/t her change in mental status  P:  Trend CVP  Gentle volume, PRBCs as needed  Resume home lasix  Avoid home B Blocker for now with bradycardia  Clonidine 0.1 TID   RENAL Acute on CKD II P:   Gentle volume  F/u chem  Resume home lasix   GASTROINTESTINAL Acute GI bleed - BRBPR, has hx diverticular bleed, hx colon ca s/p resection 2012.  Appears resolved. No further bleeding.  Hx  ETOH cirrhosis  P:   Cbc in am   GI following   Transfuse PRN as below  F/u LFT's  Trend ammonia - consider lactulose - hold for now with recent suspected diverticular bleed   HEMATOLOGIC Blood loss anemia  P:  SCD's  PRBC PRN   INFECTIOUS Leukocytosis ?CAP - LLL atx v infiltrate  P:   BCx2  10/17>>> UC 10/17>>> Sputum 10/17>>> Vanc 10/17>>> Levaquin 10/17>>>  Empiric abx for CAP with AMS, clinical decline - narrow quickly, pct unimpressive PCN allergic    ENDOCRINE DM   P:   SSI   NEUROLOGIC AMS - unclear etiology.  Doubt solely r/t GI bleeding.  Much improved.  P:   RASS goal:  -1 Daily SBT, WUA  Sedation protocol while on vent    Family updated:  No family available 10/18.  Per nsg family situation is difficult - legally married but estranged from husband.  Has a neighbor who appears to help care for her but conflict between neighbor and a ?estranged brother.  Adult daughter who she does not speak to.  May need social work consult.    TODAY'S SUMMARY:       Merton Border, MD ; [facility] [phone].  After 5:30 PM or weekends, call [phone]

## 2014-06-15 NOTE — Progress Notes (Addendum)
SW consult placed for patient due to conversation at bedside that she does not want her brother or other family members involved in making medical decisions for her. Patient informed that she needs to have advanced directives. Patient is able to participate in conversation via writing and is willing to make decisions regarding her medical care. Patient has written that she would like for [facility] [phone] and Learta Codding  [phone] to be present when SW sees patient and that she is the most comfortable with these two ladies making medical decisions for her.

## 2014-06-15 NOTE — Progress Notes (Signed)
I remain mystified by this patient, who has had recurrent "sinking spells" including lethargy and hypoventilation requiring intubation yesterday morning, and a hypotensive episode again last night, for which I was called by the nurse because the critical care physicians were tied up with a code at that time. In any event, the patient's hypotension yesterday evening responded to fluids.   With a (questionable) history of "cirrhosis" (see yesterday's note; there is no evidence of portal hypertension in this patient), certainly the idea of GI bleeding as a cause for her recurring hypotension comes to mind, but there has been no bump in her BUN nor drop in her hemoglobin in association with these episodes, nor any bloody output from either end since her initial presenting hematochezia. Therefore, I would look for alternative etiologies of her recurring instability.   I will sign off at this time, but would be happy to see the patient again if I can be of further assistance in her care.  Cleotis Nipper, M.D. [phone]

## 2014-06-16 LAB — GLUCOSE, CAPILLARY
GLUCOSE-CAPILLARY: 169 mg/dL — AB (ref 70–99)
Glucose-Capillary: 189 mg/dL — ABNORMAL HIGH (ref 70–99)
Glucose-Capillary: 203 mg/dL — ABNORMAL HIGH (ref 70–99)
Glucose-Capillary: 161 mg/dL — ABNORMAL HIGH (ref 70–99)
Glucose-Capillary: 97 mg/dL (ref 70–99)
Glucose-Capillary: 177 mg/dL — ABNORMAL HIGH (ref 70–99)

## 2014-06-16 LAB — BASIC METABOLIC PANEL
Anion gap: 12 (ref 5–15)
BUN: 15 mg/dL (ref 6–23)
CHLORIDE: 108 meq/L (ref 96–112)
CO2: 20 mEq/L (ref 19–32)
Calcium: 8.1 mg/dL — ABNORMAL LOW (ref 8.4–10.5)
Creatinine, Ser: 1.27 mg/dL — ABNORMAL HIGH (ref 0.50–1.10)
GFR calc Af Amer: 49 mL/min — ABNORMAL LOW (ref >90)
GFR, EST NON AFRICAN AMERICAN: 42 mL/min — AB (ref >90)
Glucose, Bld: 203 mg/dL — ABNORMAL HIGH (ref 70–99)
Potassium: 3.8 mEq/L (ref 3.7–5.3)
Sodium: 140 mEq/L (ref 137–147)

## 2014-06-16 LAB — CBC
HCT: 28.6 % — ABNORMAL LOW (ref 36.0–46.0)
Hemoglobin: 9.4 g/dL — ABNORMAL LOW (ref 12.0–15.0)
MCH: 26.3 pg (ref 26.0–34.0)
MCHC: 32.9 g/dL (ref 30.0–36.0)
MCV: 79.9 fL (ref 78.0–100.0)
Platelets: 198 10*3/uL (ref 150–400)
RBC: 3.58 MIL/uL — ABNORMAL LOW (ref 3.87–5.11)
RDW: 15.9 % — ABNORMAL HIGH (ref 11.5–15.5)
WBC: 12.9 10*3/uL — ABNORMAL HIGH (ref 4.0–10.5)

## 2014-06-16 LAB — AMMONIA: AMMONIA: 26 umol/L (ref 11–60)

## 2014-06-16 MED ORDER — CLONIDINE HCL 0.1 MG PO TABS
0.1000 mg | ORAL_TABLET | Freq: Three times a day (TID) | ORAL | Status: DC
  Administered 2014-06-16 – 2014-06-19 (×7): 0.1 mg via ORAL
  Filled 2014-06-16 (×12): qty 1

## 2014-06-16 MED ORDER — FUROSEMIDE 40 MG PO TABS
40.0000 mg | ORAL_TABLET | Freq: Every day | ORAL | Status: DC
  Administered 2014-06-16 – 2014-06-18 (×3): 40 mg via ORAL
  Filled 2014-06-16 (×3): qty 1

## 2014-06-16 MED ORDER — PANTOPRAZOLE SODIUM 40 MG PO TBEC
40.0000 mg | DELAYED_RELEASE_TABLET | Freq: Every day | ORAL | Status: DC
  Administered 2014-06-16 – 2014-06-19 (×4): 40 mg via ORAL
  Filled 2014-06-16 (×4): qty 1

## 2014-06-16 MED ORDER — CARVEDILOL 3.125 MG PO TABS
3.1250 mg | ORAL_TABLET | Freq: Two times a day (BID) | ORAL | Status: DC
  Administered 2014-06-16: 3.125 mg via ORAL
  Filled 2014-06-16 (×5): qty 1

## 2014-06-16 MED ORDER — FOLIC ACID 1 MG PO TABS
1.0000 mg | ORAL_TABLET | Freq: Every day | ORAL | Status: DC
  Administered 2014-06-16 – 2014-06-19 (×4): 1 mg via ORAL
  Filled 2014-06-16 (×4): qty 1

## 2014-06-16 MED ORDER — INSULIN ASPART 100 UNIT/ML ~~LOC~~ SOLN
0.0000 [IU] | Freq: Every day | SUBCUTANEOUS | Status: DC
  Administered 2014-06-17: 2 [IU] via SUBCUTANEOUS

## 2014-06-16 MED ORDER — PANTOPRAZOLE SODIUM 40 MG PO PACK: 40.0000 mg | PACK | Freq: Every day | ORAL | Status: DC

## 2014-06-16 MED ORDER — INSULIN ASPART 100 UNIT/ML ~~LOC~~ SOLN
0.0000 [IU] | Freq: Three times a day (TID) | SUBCUTANEOUS | Status: DC
  Administered 2014-06-17: 2 [IU] via SUBCUTANEOUS
  Administered 2014-06-17 – 2014-06-18 (×3): 3 [IU] via SUBCUTANEOUS
  Administered 2014-06-18 – 2014-06-19 (×2): 5 [IU] via SUBCUTANEOUS
  Administered 2014-06-19: 3 [IU] via SUBCUTANEOUS
  Filled 2014-06-16: qty 5
  Filled 2014-06-16: qty 3

## 2014-06-16 MED ORDER — FENTANYL CITRATE 0.05 MG/ML IJ SOLN: 12.5000 ug | INTRAMUSCULAR | Status: DC | PRN

## 2014-06-16 NOTE — Plan of Care (Signed)
Problem: Phase I Progression Outcomes Goal: Voiding-avoid urinary catheter unless indicated Outcome: Not Met (add Reason) Patient required foley while intubated, plan to dc today

## 2014-06-16 NOTE — Plan of Care (Signed)
Problem: Phase II Progression Outcomes Goal: Date pt extubated/weaned off vent Outcome: Completed/Met Date Met:  06/16/14 06/16/2014    Goal: Time pt extubated/weaned off vent Outcome: Completed/Met Date Met:  06/16/14 06/16/2014

## 2014-06-16 NOTE — Progress Notes (Signed)
eLink Physician-Brief Progress Note Patient Name: [name] DOB: 1946/02/05 MRN: [id number]   Date of Service  06/16/2014  HPI/Events of Note    eICU Interventions  Changed SSI schedule to q ac + hs     Intervention Category Intermediate Interventions: Other:  Atira Borello S. 06/16/2014, 7:48 PM

## 2014-06-16 NOTE — Progress Notes (Signed)
235cc of Fentanyl wasted with Livia Snellen RN.

## 2014-06-16 NOTE — Care Management Note (Addendum)
    Page 1 of 1   06/18/2014     3:14:20 PM CARE MANAGEMENT NOTE 06/18/2014  Patient:  [name], [name]   Account Number:  [id number]  Date Initiated:  06/16/2014  Documentation initiated by:  Elissa Hefty  Subjective/Objective Assessment:   adm w gi bleed     Action/Plan:   lives at home, pcp dr Jeanie Cooks   Anticipated DC Date:     Anticipated DC Plan:           Choice offered to / List presented to:             Status of service:   Medicare Important Message given?  YES (If response is "NO", the following Medicare IM given date fields will be blank) Date Medicare IM given:  06/17/2014 Medicare IM given by:  Elissa Hefty Date Additional Medicare IM given:   Additional Medicare IM given by:    Discharge Disposition:    Per UR Regulation:  Reviewed for med. necessity/level of care/duration of stay  If discussed at [place] of Stay Meetings, dates discussed:   06/19/2014    Comments:  06-18-14 [place], RN,BSN [phone] Plan is for SNF oncemedically stable for d/c.

## 2014-06-16 NOTE — Progress Notes (Signed)
Inpatient Diabetes Program Recommendations  AACE/ADA: New Consensus Statement on Inpatient Glycemic Control (2013)  Target Ranges:  Prepandial:   less than 140 mg/dL      Peak postprandial:   less than 180 mg/dL (1-2 hours)      Critically ill patients:  140 - 180 mg/dL   Reason for Assessment:  Results for AYODELE, HARTSOCK (MRN [id number]) as of 06/16/2014 15:15  Ref. Range 06/15/2014 20:24 06/16/2014 00:24 06/16/2014 03:40 06/16/2014 08:46 06/16/2014 11:56  Glucose-Capillary Latest Range: 70-99 mg/dL 170 (H) 169 (H) 189 (H) 203 (H) 161 (H)   Diabetes history: Type 2 diabetes Outpatient Diabetes medications: Amaryl 4 mg daily,  Levemir 8 units q HS, Januvia 50 mg daily Current orders for Inpatient glycemic control:   Please consider restarting Levemir 8 units q HS.   Thanks, Adah Perl, RN, BC-ADM Inpatient Diabetes Coordinator Pager [phone]

## 2014-06-16 NOTE — Progress Notes (Signed)
Pt's brother Clint Lipps called and requested visitation be limited to certain people, including himself, brother Rolan Bucco, and friend Caren Griffins. Pt is alert and communicates appropriately via head shakes/nods, writing, and mouthing words. Communicated brother's request to limit visitors, but pt refused and does not want visitation limited to certain people.... [name]

## 2014-06-16 NOTE — Clinical Social Work Note (Signed)
Clinical Social Worker met with patient at bedside in reference to advanced directives. CSW spoke with patient at length on the events leading up to inquiry for an advanced directive. Patient reported her brother was advocating to limit her visitation while pt was on ventilator and that she does not want him making any medical decisions on her behalf. Patient reported wanting her friend, Rudell Cobb to become her [place]. CSW notified chaplain on patient's request for advanced directives information.  Clinical Social Worker will sign off for now as social work intervention is no longer needed. Please consult [place] again if new need arises.   Glendon Axe, MSW, LCSWA [phone] 06/16/2014 5:09 PM n

## 2014-06-16 NOTE — Plan of Care (Signed)
Problem: Consults Goal: GI Bleeding Patient Education See Patient Education Module for education specifics.  Outcome: Not Met (add Reason) Patient unable to learn, poor support system.

## 2014-06-16 NOTE — Progress Notes (Signed)
Chaplain followed up with pt and friends at bedside.  Pt is waiting for one more person to consult re HCOA and Living Will.  Other person is planning to be here at 1600 and HCPOA should be completed then.  Chaplain advised pt and friends to page chaplain at that time to complete the advanced directive.  Chaplain provided emotional support and education.  06/16/14 1300  Clinical Encounter Type  Visited With Patient and family together  Visit Type Follow-up  Spiritual Encounters  Spiritual Needs Literature;Emotional  Stress Factors  Patient Stress Factors Exhausted;Health changes;Major life changes  Family Stress Factors None identified  Advance Directives (For Healthcare)  Does patient have an advance directive? No  Would patient like information on creating an advanced directive? Yes - Educational materials given   Geralyn Flash

## 2014-06-16 NOTE — Progress Notes (Signed)
INITIAL NUTRITION ASSESSMENT  DOCUMENTATION CODES Per approved criteria  -Not Applicable   INTERVENTION:  Diet advancement as able post extubation. Recommend regular diet.  NUTRITION DIAGNOSIS: Inadequate oral intake related to inability to eat as evidenced by NPO status.   Goal: Intake to meet >90% of estimated nutrition needs.  Monitor:  TF tolerance/adequacy, weight trend, labs, vent status.  Reason for Assessment: MD Consult for TF initiation and management.  68 y.o. female  Admitting Dx: AMS  ASSESSMENT: 68 yo female with hx COPD, DM, CKD, hx colon ca s/p resection 2012, ETOH cirrhosis presented 10/17 with BRBPR. She was admitted by [facility] to floor and quickly deteriorated with AMS, requiring tx to ICU and intubation.   Patient was just extubated this morning. Plans to evaluate in 4 hours for ability to begin PO diet per discussion with RN. Patient reports that she has lost weight, but this is not evident in weight history, which shows that she has gained weight. Weight fluctuations are likely related to fluid status. Nutrition focused physical exam completed.  No muscle or subcutaneous fat depletion noticed.   Received MD Consult for TF initiation and management per adult TF protocol. Patient has been receiving Vital High Protein at 10 ml/h, currently off since extubation.   Height: Ht Readings from Last 1 Encounters:  06/14/14 5' (1.524 m)    Weight: Wt Readings from Last 1 Encounters:  06/16/14 156 lb 8.4 oz (71 kg)    Ideal Body Weight: 45.5 kg  % Ideal Body Weight: 156%  Wt Readings from Last 10 Encounters:  06/16/14 156 lb 8.4 oz (71 kg)  03/04/14 150 lb (68.04 kg)  09/03/13 150 lb (68.04 kg)  08/06/13 147 lb (66.679 kg)  08/06/13 147 lb (66.679 kg)  05/20/13 149 lb 4.8 oz (67.722 kg)  03/21/13 143 lb 6.4 oz (65.046 kg)  03/13/13 144 lb (65.318 kg)  11/17/12 153 lb 9.6 oz (69.673 kg)  09/11/12 152 lb 4 oz (69.06 kg)   Admission Weight: 150 lb 9 oz  (68.4 kg)  Usual Body Weight: 150 lb  % Usual Body Weight: 100%  BMI:  29.5 (using admission weight)  Estimated Nutritional Needs: Kcal: 1400-1600 Protein: 80-95 gm Fluid: 1.5 L  Skin: WDL  Diet Order:  NPO  EDUCATION NEEDS: -No education needs identified at this time   Intake/Output Summary (Last 24 hours) at 06/16/14 0851 Last data filed at 06/16/14 0800  Gross per 24 hour  Intake    898 ml  Output   1180 ml  Net   -282 ml    Last BM: 10/17   Labs:   Recent Labs Lab 06/14/14 1027 06/15/14 0409 06/16/14 0545  NA 138 140 140  K 4.1 3.8 3.8  CL 106 111 108  CO2 20 18* 20  BUN 16 15 15   CREATININE 1.11* 1.12* 1.27*  CALCIUM 8.2* 7.5* 8.1*  GLUCOSE 212* 174* 203*    CBG (last 3)   Recent Labs  06/15/14 2024 06/16/14 0024 06/16/14 0340  GLUCAP 170* 169* 189*    Scheduled Meds: . antiseptic oral rinse  7 mL Mouth Rinse QID  . carvedilol  3.125 mg Oral BID WC  . chlorhexidine  15 mL Mouth Rinse BID  . cloNIDine  0.1 mg Per Tube TID  . feeding supplement (VITAL HIGH PROTEIN)  1,000 mL Per Tube Q24H  . folic acid  1 mg Per Tube Daily  . furosemide  40 mg Per Tube Daily  . insulin aspart  0-15 Units Subcutaneous 6 times per day  . ipratropium-albuterol  3 mL Nebulization Q6H  . pantoprazole sodium  40 mg Per Tube Q1200  . sodium chloride  3 mL Intravenous Q12H    Continuous Infusions: . dextrose 5 % and 0.45 % NaCl with KCl 20 mEq/L 10 mL/hr at 06/15/14 1228  . fentaNYL infusion INTRAVENOUS Stopped (06/16/14 0830)    Past Medical History  Diagnosis Date  . Alcoholic cirrhosis   . Reflux esophagitis   . Gastroesophageal reflux   . Colon polyps   . CHF (congestive heart failure) 10/13    grade 1 diastolic dysfunction, Nl LVF  . Hypertension   . History of GI diverticular bleed   . Pneumonia     history of  . Asthma   . Anemia   . Blood transfusion few years ago  . Villous adenoma of colon 05/10/11  . PVD (peripheral vascular disease)  2009    bilat iliac stenting  . Chronic obstructive pulmonary disease (COPD)   . Arthritis   . Glaucoma     left  . Gout   . Acute venous embolism and thrombosis of deep vessels of proximal lower extremity   . Back pain   . Leg swelling   . Diabetes mellitus     insulin dep   . Hepatitis     alcoholic hepatitis  . Depression   . Anxiety   . On home oxygen therapy oxygen 2 liter per minute prn per [place]    Past Surgical History  Procedure Laterality Date  . Cataract extraction Right yrs ago  . Colonoscopy    . Laparoscopic assissted total colectomy w/ j-pouch  04/22/11  . Ventral hernia repair  06/13/2012    Procedure: LAPAROSCOPIC VENTRAL HERNIA;  Surgeon: Adin Hector, MD;  Location: [place];  Service: General;  Laterality: N/A;  Laparoscopic Assisted Ventral Hernia with Mesh  . Abdominal hysterectomy  yrs ago  . Esophagogastroduodenoscopy (egd) with propofol N/A 08/06/2013    Procedure: ESOPHAGOGASTRODUODENOSCOPY (EGD) WITH PROPOFOL;  Surgeon: Lear Ng, MD;  Location: [facility];  Service: Endoscopy;  Laterality: N/A;  . Colonoscopy with propofol N/A 08/06/2013    Procedure: COLONOSCOPY WITH PROPOFOL;  Surgeon: Lear Ng, MD;  Location: [facility];  Service: Endoscopy;  Laterality: N/A;    Molli Barrows, RD, LDN, [place] Pager [phone] After Hours Pager [phone]

## 2014-06-16 NOTE — Progress Notes (Signed)
[facility] Progress Note Patient Name: LINNAE RASOOL DOB: 1945-12-22 MRN: [id number]   Date of Service  06/16/2014  HPI/Events of Note    eICU Interventions  Diet ordered     Intervention Category Minor Interventions: Routine modifications to care plan (e.g. PRN medications for pain, fever)  Nakhia Levitan S. 06/16/2014, 5:06 PM

## 2014-06-16 NOTE — Plan of Care (Signed)
Problem: Consults Goal: General Medical Patient Education See Patient Education Module for specific education. Outcome: Completed/Met Date Met:  06/16/14 Provided education to patient and family about current acute illness. Provided education on Healthcare POA and [place].   Problem: Phase I Progression Outcomes Goal: Voiding-avoid urinary catheter unless indicated Outcome: Not Applicable Date Met:  94/07/68 Address on previous care plan Goal: Other Phase I Outcomes/Goals Outcome: Completed/Met Date Met:  06/16/14 Social work and [employer] consult to provided support

## 2014-06-16 NOTE — Procedures (Signed)
Extubation Procedure Note  Patient Details:   Name: [name] DOB: 19-May-1946 MRN: [id number]   Airway Documentation:  Airway (Active)    Evaluation  O2 sats: stable throughout Complications: No apparent complications Patient did tolerate procedure well. Bilateral Breath Sounds: Diminished;Clear Suctioning: Oral;Airway Yes  Pt extubated to 4L [place]. Pt had no cuff leak, but ok to extubate per Dr Alva Garnet. Pt had large brown/pink tinged mucous plug in ett. MD aware. Pt stable throughout extubation and able to speak name post extubation. Pt had copious oral thick secretions. Pt given yankauer for sputum.   Laymond Purser M 06/16/2014, 9:25 AM

## 2014-06-16 NOTE — Progress Notes (Signed)
PULMONARY / CRITICAL CARE MEDICINE   Name: [name] MRN: [id number] DOB: 1945-08-31    ADMISSION DATE:  06/14/2014 CONSULTATION DATE:  06/14/14  REFERRING MD :  Mary[place] Pink   CHIEF COMPLAINT:  AMS  INITIAL PRESENTATION: 68yo female with hx COPD, DM, CKD, hx colon ca s/p resection 2012, ETOH cirrhosis presented 10/17 with BRBPR.  She was admitted by [facility] to floor and quickly deteriorated with AMS, requiring tx to ICU and intubation.    STUDIES:  CT head 10/17>>> neg acute  abd u/s 10/17>>> neg acute, cirrhotic liver, medical renal disease   SIGNIFICANT EVENTS: 10/18 unable to wean 2/2 tachycardia and tachypnea  10/19 successful extubation  SUBJECTIVE:  Concern regarding social situation and decision making ability of family members; no more episodes of brady or bleeding  VITAL SIGNS: Temp:  [98.4 F (36.9 C)-99.3 F (37.4 C)] 99.1 F (37.3 C) (10/19 0341) Pulse Rate:  [85-142] 94 (10/19 0700) Resp:  [11-22] 14 (10/19 0700) BP: (95-152)/(54-94) 112/59 mmHg (10/19 0700) SpO2:  [97 %-100 %] 99 % (10/19 0700) FiO2 (%):  [40 %] 40 % (10/19 0303) Weight:  [156 lb 8.4 oz (71 kg)-157 lb 3 oz (71.3 kg)] 156 lb 8.4 oz (71 kg) (10/19 0500) HEMODYNAMICS:   VENTILATOR SETTINGS: Vent Mode:  [-] PRVC FiO2 (%):  [40 %] 40 % Set Rate:  [14 bmp] 14 bmp Vt Set:  [500 mL] 500 mL PEEP:  [5 cmH20] 5 cmH20 Pressure Support:  [10 cmH20] 10 cmH20 Plateau Pressure:  [18 cmH20-28 cmH20] 19 cmH20 INTAKE / OUTPUT:  Intake/Output Summary (Last 24 hours) at 06/16/14 0748 Last data filed at 06/16/14 0500  Gross per 24 hour  Intake   1148 ml  Output   1230 ml  Net    -82 ml    PHYSICAL EXAMINATION: General:  Chronically ill appearing female, NAD on vent  Neuro:  Awake, alert, nods appropriately, does not use left hand HEENT:  Mm pale, dry, no JVD, sclera anicteric  Cardiovascular:  s1s2 rrr, mild tachy  Lungs:  CTAB, nml WOB, ventilated  Abdomen:  Round, mildly distended but soft  (non tender), hypoactive BS  Musculoskeletal:  Warm and dry, no edema   LABS:  CBC  Recent Labs Lab 06/15/14 0409 06/15/14 0925 06/16/14 0545  WBC 13.5* 15.9* 12.9*  HGB 9.6* 10.1* 9.4*  HCT 29.5* 30.9* 28.6*  PLT 201 227 198   Coag's  Recent Labs Lab 06/14/14 0112  APTT 36  INR 1.11   BMET  Recent Labs Lab 06/14/14 1027 06/15/14 0409 06/16/14 0545  NA 138 140 140  K 4.1 3.8 3.8  CL 106 111 108  CO2 20 18* 20  BUN 16 15 15   CREATININE 1.11* 1.12* 1.27*  GLUCOSE 212* 174* 203*   Electrolytes  Recent Labs Lab 06/14/14 1027 06/15/14 0409 06/16/14 0545  CALCIUM 8.2* 7.5* 8.1*   Sepsis Markers  Recent Labs Lab 06/14/14 1037 06/15/14 0409  PROCALCITON <0.10 <0.10   ABG  Recent Labs Lab 06/14/14 0843 06/14/14 1005  PHART 7.322* 7.342*  PCO2ART 41.7 41.1  PO2ART 64.5* 324.0*   Liver Enzymes  Recent Labs Lab 06/14/14 0112 06/14/14 0900  AST 23 16  ALT 14 11  ALKPHOS 89 67  BILITOT <0.2* <0.2*  ALBUMIN 3.2* 2.4*   Cardiac Enzymes  Recent Labs Lab 06/14/14 1027 06/14/14 1627 06/15/14 0103  TROPONINI <0.30 <0.30 <0.30   Glucose  Recent Labs Lab 06/15/14 0753 06/15/14 1122 06/15/14 1627 06/15/14 2024 06/16/14 0024  06/16/14 0340  GLUCAP 139* 173* 145* 170* 169* 189*    Imaging CXR 10/18>> bilat atx   ASSESSMENT / PLAN:  PULMONARY OETT  10/17>> 10/19 Acute respiratory failure - r/t AMS  Hx COPD - no evidence acute exacerbation  ?CAP P:   Supp O2 post extubation Monitor in ICU post extubation  CARDIOVASCULAR CVL  L IJ CVL 10/17 >>  Hypotension, resolved  Hx HTN  Hx dCHF Tachy/Bradycardia - intermittent   P:  Cont current Rx  RENAL Acute on CKD II P:   Monitor BMET intermittently Monitor I/Os Correct electrolytes as indicated Cont daily furosemide  GASTROINTESTINAL Acute LGIB - resolved Hx of diverticulosis Hx ETOH cirrhosis  Chronic PPI use P:   SUP: PO PPI Advance diet as tolerated post  extubation  HEMATOLOGIC Acute blood loss anemia due to transient LGIB P:  DVT px: SCDs Monitor CBC intermittently Transfuse per usual ICU guidelines  INFECTIOUS Leukocytosis Doubt PNA  P:   BCx2  10/17>>> UC 10/17>>> Sputum 10/17>>> Vanc 10/17>>>10/18 Levaquin 10/17>>>10/18  Following off abx  ENDOCRINE DM 2  P:   Transition SSI to ACHS post extubation  NEUROLOGIC AMS, resolved.  P:   RASS goal:  0 Minimize sedatives/opioids post extubation    TODAY'S SUMMARY:   CCM X 35 mins  Merton Border, MD;  PCCM service; Mobile [phone]

## 2014-06-16 NOTE — Progress Notes (Signed)
Chaplain responded to consult from [place] that family dynamics indicate a need for an advanced directive.  As per CSW, pt's sibling has assumed power of pt's healthcare against pt's wishes.  Pt wishes to create HCPOA giving this authority to a neighbor.  Chaplain educated pt as to different Advanced Directives and gave pt paperwork to review.  Chaplain will follow up with pt later in day to complete document if needed.  06/16/14 1100  Clinical Encounter Type  Visited With Patient;Health care provider  Visit Type Initial  Referral From Social work  Spiritual Encounters  Spiritual Needs Literature  Stress Factors  Patient Stress Factors Exhausted;Family relationships;Health changes  Family Stress Factors None identified  Advance Directives (For Healthcare)  Does patient have an advance directive? No  Would patient like information on creating an advanced directive? Yes - Educational materials given  Geralyn Flash

## 2014-06-17 LAB — GLUCOSE, CAPILLARY
GLUCOSE-CAPILLARY: 130 mg/dL — AB (ref 70–99)
GLUCOSE-CAPILLARY: 163 mg/dL — AB (ref 70–99)
GLUCOSE-CAPILLARY: 215 mg/dL — AB (ref 70–99)
Glucose-Capillary: 159 mg/dL — ABNORMAL HIGH (ref 70–99)

## 2014-06-17 MED ORDER — ALLOPURINOL 100 MG PO TABS
100.0000 mg | ORAL_TABLET | Freq: Two times a day (BID) | ORAL | Status: DC
  Administered 2014-06-17 – 2014-06-19 (×5): 100 mg via ORAL
  Filled 2014-06-17 (×5): qty 1

## 2014-06-17 MED ORDER — CARVEDILOL 6.25 MG PO TABS
6.2500 mg | ORAL_TABLET | Freq: Two times a day (BID) | ORAL | Status: DC
  Administered 2014-06-17 – 2014-06-19 (×4): 6.25 mg via ORAL
  Filled 2014-06-17 (×7): qty 1

## 2014-06-17 NOTE — Progress Notes (Signed)
06/16/14 1600  Clinical Encounter Type  Visited With Patient;Other (Comment) (Friends)  Visit Type Follow-up  Referral From Patient  Advance Directives (For Healthcare)  Does patient have an advance directive? Yes   Chaplain visited with patient and patient's two friends. Chaplain assisted the patient in completing an advanced directive. Patient's two friends became her power of attorneys. Patient's two friends assisted were very supportive of the patient and encouraged her to make her own decisions. Patient was in good spirits but has a lot of family anxiety, especially concerning one of her relatives that is trying to control her situation. Patient successfully completed her advanced directive while chaplain was present and the chaplain made copies for the patient and the patient's friends. Chaplain will continue to provide emotional and spiritual support for patient and patient's family as needed. Gar Ponto, Chaplain 9:03 AM

## 2014-06-17 NOTE — Evaluation (Signed)
Physical Therapy Evaluation Patient Details Name: [name] MRN: [id number] DOB: [birth year] Today's Date: 06/17/2014   History of Present Illness  68yo female with hx COPD, DM, CKD, hx colon ca s/p resection 2012, ETOH cirrhosis presented 10/17 with BRBPR.  She was admitted by [facility] to floor and quickly deteriorated with AMS, requiring tx to ICU and intubation.    Clinical Impression  Pt very slow moving today with encouragement, cues, reassurance and assist for initiation. Pt without assist at home but does state she has some sort of call bell in her apartment. Pt with bil foot pain stating she takes medication for gout at home but Allopurinol not given since 10/17 with RN made aware of pt medication and request to resume. Pt reports feet feel like gout. Pt very limited with mobility today due to foot pain and her tolerance for therapy will largely dictate discharge disposition. Pt dropping to 85% on RA at rest and returned to 2L with sats 93%. Pt will benefit from acute therapy to maximize mobility, function, gait and safety for increased independence at D/C.     Follow Up Recommendations SNF;Supervision/Assistance - 24 hour    Equipment Recommendations  None recommended by PT    Recommendations for Other Services       Precautions / Restrictions Precautions Precautions: Fall Precaution Comments: watch sats      Mobility  Bed Mobility Overal bed mobility: Needs Assistance Bed Mobility: Supine to Sit     Supine to sit: Supervision     General bed mobility comments: increased time with cues for initiation  Transfers Overall transfer level: Needs assistance   Transfers: Sit to/from Stand;Stand Pivot Transfers Sit to Stand: Min assist Stand pivot transfers: Min assist       General transfer comment: cues for hand placement, posture, RW use and safety  Ambulation/Gait Ambulation/Gait assistance:  (pt unable secondary to bil foot pain)              Stairs            Wheelchair Mobility    Modified Rankin (Stroke Patients Only)       Balance Overall balance assessment: Needs assistance   Sitting balance-Leahy Scale: Good       Standing balance-Leahy Scale: Poor                               Pertinent Vitals/Pain Pain Assessment: 0-10 Pain Score: 5  Pain Location: bil feet in standing Pain Descriptors / Indicators: Aching Pain Intervention(s): Repositioned;Other (comment) (Rn notified)    Home Living Family/patient expects to be discharged to:: Private residence Living Arrangements: Alone   Type of Home: Apartment Home Access: Level entry     [place]: One [place]: [place] - 2 wheels;Cane - single point;Bedside commode;Tub bench;Hand held shower head;Walker - standard      Prior Function           Comments: pt stating she cares for herself and doesn't use AD in her apt and doesn't normally walk outside other than to [place]     Hand Dominance        Extremity/Trunk Assessment   Upper Extremity Assessment: Generalized weakness           Lower Extremity Assessment: Generalized weakness      Cervical / Trunk Assessment: Kyphotic  Communication   Communication: No difficulties  Cognition Arousal/Alertness: Awake/alert Behavior During Therapy: [facility] for tasks  assessed/performed Overall Cognitive Status: No family/caregiver present to determine baseline cognitive functioning Area of Impairment: Problem solving             Problem Solving: Slow processing      General Comments      Exercises        Assessment/Plan    PT Assessment Patient needs continued PT services  PT Diagnosis Difficulty walking;Acute pain;Generalized weakness   PT Problem List Decreased strength;Decreased activity tolerance;Decreased balance;Decreased mobility;Cardiopulmonary status limiting activity;Pain;Decreased safety awareness;Decreased knowledge of use of DME  PT Treatment  Interventions Gait training;DME instruction;Balance training;Functional mobility training;Therapeutic activities;Therapeutic exercise;Patient/family education   PT Goals (Current goals can be found in the Care Plan section) Acute Rehab PT Goals Patient Stated Goal: return home PT Goal Formulation: With patient Time For Goal Achievement: 07/01/14 Potential to Achieve Goals: Fair    Frequency Min 3X/week   Barriers to discharge Decreased caregiver support      Co-evaluation               End of Session Equipment Utilized During Treatment: Gait belt Activity Tolerance: Patient limited by pain Patient left: in chair;with call bell/phone within reach;with chair alarm set Nurse Communication: Mobility status;Precautions         Time: 1478-2956 PT Time Calculation (min): 30 min   Charges:   PT Evaluation $Initial PT Evaluation Tier I: 1 Procedure PT Treatments $Therapeutic Activity: 8-22 mins   PT G CodesMelford Aase 06/17/2014, 1:53 PM Elwyn Reach, [place]

## 2014-06-17 NOTE — Progress Notes (Signed)
Patient's afternoon blood pressure 94/51, afternoon doses of coreg and clonodine held.  Dr. Melvyn Novas paged and notified.  Will continue to monitor.  Sanda Linger

## 2014-06-17 NOTE — Plan of Care (Signed)
Problem: Phase II Progression Outcomes Goal: Discharge plan established Outcome: Progressing CSW consulted

## 2014-06-17 NOTE — Progress Notes (Signed)
PULMONARY / CRITICAL CARE MEDICINE   Name: [name] MRN: [id number] DOB: 17-Nov-1945    ADMISSION DATE:  06/14/2014 CONSULTATION DATE:  06/14/14  REFERRING MD :  [place] Pink   CHIEF COMPLAINT:  AMS  INITIAL PRESENTATION: 68yo female with hx COPD, DM, CKD, hx colon ca s/p resection 2012, ETOH cirrhosis presented 10/17 with BRBPR.  She was admitted by [facility] to floor and quickly deteriorated with AMS, requiring tx to ICU and intubation.    STUDIES:  CT head 10/17>>> neg acute  abd u/s 10/17>>> neg acute, cirrhotic liver, medical renal disease   SIGNIFICANT EVENTS: 10/18 unable to wean 2/2 tachycardia and tachypnea  10/19 successful extubation  SUBJECTIVE:  No further episodes of bleeding; tolerated extubation well yesterday  VITAL SIGNS: Temp:  [98.3 F (36.8 C)-99.2 F (37.3 C)] 99 F (37.2 C) (10/20 0500) Pulse Rate:  [92-128] 92 (10/20 0700) Resp:  [13-32] 23 (10/20 0700) BP: (90-138)/(51-80) 111/56 mmHg (10/20 0700) SpO2:  [91 %-100 %] 98 % (10/20 0700) FiO2 (%):  [40 %] 40 % (10/19 0856) HEMODYNAMICS:   VENTILATOR SETTINGS: Vent Mode:  [-] CPAP;PSV FiO2 (%):  [40 %] 40 % PEEP:  [5 cmH20] 5 cmH20 Pressure Support:  [5 cmH20] 5 cmH20 INTAKE / OUTPUT:  Intake/Output Summary (Last 24 hours) at 06/17/14 0743 Last data filed at 06/16/14 2210  Gross per 24 hour  Intake    323 ml  Output    985 ml  Net   -662 ml    PHYSICAL EXAMINATION: General:  Chronically ill appearing female, NAD Neuro:  Awake, alert HEENT:  Mm pale, dry, no JVD, sclera anicteric  Cardiovascular:  s1s2 rrr, mild tachy  Lungs:  CTAB, nml WOB, [place] in place Abdomen:  Round, mildly distended but soft (non tender), hypoactive BS  Musculoskeletal:  Warm and dry, no edema   LABS:  CBC  Recent Labs Lab 06/15/14 0409 06/15/14 0925 06/16/14 0545  WBC 13.5* 15.9* 12.9*  HGB 9.6* 10.1* 9.4*  HCT 29.5* 30.9* 28.6*  PLT 201 227 198   Coag's  Recent Labs Lab 06/14/14 0112  APTT 36   INR 1.11   BMET  Recent Labs Lab 06/14/14 1027 06/15/14 0409 06/16/14 0545  NA 138 140 140  K 4.1 3.8 3.8  CL 106 111 108  CO2 20 18* 20  BUN 16 15 15   CREATININE 1.11* 1.12* 1.27*  GLUCOSE 212* 174* 203*   Electrolytes  Recent Labs Lab 06/14/14 1027 06/15/14 0409 06/16/14 0545  CALCIUM 8.2* 7.5* 8.1*   Sepsis Markers  Recent Labs Lab 06/14/14 1037 06/15/14 0409  PROCALCITON <0.10 <0.10   ABG  Recent Labs Lab 06/14/14 0843 06/14/14 1005  PHART 7.322* 7.342*  PCO2ART 41.7 41.1  PO2ART 64.5* 324.0*   Liver Enzymes  Recent Labs Lab 06/14/14 0112 06/14/14 0900  AST 23 16  ALT 14 11  ALKPHOS 89 67  BILITOT <0.2* <0.2*  ALBUMIN 3.2* 2.4*   Cardiac Enzymes  Recent Labs Lab 06/14/14 1027 06/14/14 1627 06/15/14 0103  TROPONINI <0.30 <0.30 <0.30   Glucose  Recent Labs Lab 06/16/14 0024 06/16/14 0340 06/16/14 0846 06/16/14 1156 06/16/14 1537 06/16/14 2201  GLUCAP 169* 189* 203* 161* 97 177*    Imaging CXR 10/18>> bilat atx   ASSESSMENT / PLAN:  PULMONARY OETT  10/17>> 10/19 Acute respiratory failure - r/t AMS  Hx COPD - no evidence acute exacerbation  ?CAP P:   Supp O2 post extubation Wean as able CXR as needed  CARDIOVASCULAR  CVL  L IJ CVL 10/17 >>  Hypotension, resolved  Hx HTN  Hx dCHF Tachy/Bradycardia - intermittent   P:  Will increase coreg to 6.25 BID Otherwise cont current meds   RENAL Acute on CKD II P:   Monitor BMET intermittently Monitor I/Os Correct electrolytes as indicated Cont daily furosemide, watch Cr  GASTROINTESTINAL Acute LGIB - resolved Hx of diverticulosis Hx ETOH cirrhosis  Chronic PPI use P:   SUP: PO PPI Heart healthy/carb modified KVO  HEMATOLOGIC Acute blood loss anemia due to transient LGIB P:  DVT px: SCDs Monitor CBC intermittently  INFECTIOUS Leukocytosis Doubt PNA  P:   BCx2  10/17>>> UC 10/17>>> Sputum 10/17>>> Vanc 10/17>>>10/18 Levaquin  10/17>>>10/18  Following off abx  ENDOCRINE DM 2  P:   SSI AC/qhs Will monitor for 24 hr needs  NEUROLOGIC AMS, resolved.  P:   RASS goal:n/a Minimize sedatives/opioids post extubation  TODAY'S SUMMARY:  Transfer to tele, firm up Social situation with SW/chaplain assistance, O2 needs  Bernadene Bell, MD Family Medicine PGY-2 Please page or call with questions  PCCM ATTENDING: I have interviewed and examined the patient and reviewed the database. I have formulated the assessment and plan as reflected in the note above with amendments made by me.   All ICU issues resolved. Transfer back to med-surg and [facility] service. Discussed with Dr Sherral Hammers  Merton Border, MD;  PCCM service; Mobile [phone]

## 2014-06-18 ENCOUNTER — Inpatient Hospital Stay (HOSPITAL_COMMUNITY): Payer: PRIVATE HEALTH INSURANCE

## 2014-06-18 LAB — TYPE AND SCREEN
ABO/RH(D): O POS
ANTIBODY SCREEN: NEGATIVE
Donor AG Type: NEGATIVE
Donor AG Type: NEGATIVE
UNIT DIVISION: 0
UNIT DIVISION: 0

## 2014-06-18 LAB — GLUCOSE, CAPILLARY
GLUCOSE-CAPILLARY: 153 mg/dL — AB (ref 70–99)
Glucose-Capillary: 168 mg/dL — ABNORMAL HIGH (ref 70–99)
Glucose-Capillary: 211 mg/dL — ABNORMAL HIGH (ref 70–99)
Glucose-Capillary: 94 mg/dL (ref 70–99)

## 2014-06-18 NOTE — Progress Notes (Signed)
NP on call notified of negative abd xray results. Hoover Brunette, RN

## 2014-06-18 NOTE — Progress Notes (Addendum)
Clinical Social Work Department CLINICAL SOCIAL WORK PLACEMENT NOTE 06/18/2014  Patient:  [name], [name]  Account Number:  [id number] Admit date:  06/14/2014  Clinical Social Worker:  Adair Laundry  Date/time:  06/18/2014 10:40 AM  Clinical Social Work is seeking post-discharge placement for this patient at the following level of care:   SKILLED NURSING   (*CSW will update this form in Epic as items are completed)   06/18/2014  Patient/family provided with [place] Department of Clinical Social Work's list of facilities offering this level of care within the geographic area requested by the patient (or if unable, by the patient's family).  06/18/2014  Patient/family informed of their freedom to choose among providers that offer the needed level of care, that participate in Medicare, Medicaid or managed care program needed by the patient, have an available bed and are willing to accept the patient.  06/18/2014  Patient/family informed of [facility]' ownership interest in [facility], as well as of the fact that they are under no obligation to receive care at this facility.  PASARR submitted to EDS on 06/18/2014 PASARR number received on 06/18/2014  FL2 transmitted to all facilities in geographic area requested by pt/family on  06/18/2014 FL2 transmitted to all facilities within larger geographic area on   Patient informed that his/her managed care company has contracts with or will negotiate with  certain facilities, including the following:     Patient/family informed of bed offers received:  06/18/2014 Patient chooses bed at [facility] Physician recommends and patient chooses bed [facility]    [facility]  06/19/2014 Patient to be transferred to facility by [place] Patient and family notified of transfer on 06/19/2014 Name of family member notified:  Caren Griffins  The following physician request were entered in  Epic: Physician Request  Please sign FL2.    Additional CommentsBerton Mount, [place]

## 2014-06-18 NOTE — Progress Notes (Signed)
Clinical Social Work Department BRIEF PSYCHOSOCIAL ASSESSMENT 06/18/2014  Patient:  [name], [name]     Account Number:  [id number]     Admit date:  06/14/2014  Clinical Social Worker:  Adair Laundry  Date/Time:  06/18/2014 10:00 AM  Referred by:  Physician  Date Referred:  06/18/2014 Referred for  SNF Placement   Other Referral:   Interview type:  Patient Other interview type:    PSYCHOSOCIAL DATA Living Status:  ALONE Admitted from facility:   Level of care:   Primary support name:  Stormy Card Primary support relationship to patient:  FAMILY Degree of support available:   Pt has good community support    CURRENT CONCERNS Current Concerns  Post-Acute Placement   Other Concerns:    SOCIAL WORK ASSESSMENT / PLAN CSW visited pt room and spoke with pt about PT recommendation. Pt laying in bed and in good spirits during conversation. Pt informed CSW she has no previous experience with ST rehab or SNF but does agree this is what she will need at dc. CSW explained SNF referral process and pt is agreeable to referral being sent to all [facility]. Pt stated that she would want to talk to her friend Caren Griffins who is a Marine scientist. Pt hoping Caren Griffins can help her choose a facility. CSW explained that when bed offers were available CSW would bring them to pt. Pt thankful for this.   Assessment/plan status:  Psychosocial Support/Ongoing Assessment of Needs Other assessment/ plan:   Information/referral to community resources:   SNF list to be provided with bed offers    PATIENT'S/FAMILY'S RESPONSE TO PLAN OF CARE: Pt is agreeable to SNF       [place], [place]

## 2014-06-18 NOTE — Progress Notes (Signed)
PROGRESS NOTE    [name] QPY:[id number] DOB: [birth year] DOA: 06/14/2014 PCP: Philis Fendt, MD  HPI/Brief narrative 68yo female with hx COPD, DM, CKD, hx colon ca s/p resection 2012, ETOH cirrhosis presented 10/17 with BRBPR. She was admitted by [facility] to floor and quickly deteriorated with AMS, requiring tx to ICU and intubation. After she was stabilized, her care was transferred back to medical floor and [facility] on 10/21   Assessment/Plan:  1. Acute respiratory failure: Related to altered mental status. No evidence of COPD exacerbation. Stable post extubation. Resolved. 2. COPD: Stable 3. Hypotension: Resolved. 4. Hypertension: Controlled. Continue carvedilol 5. History of CHF: Compensated. 6. Intermittent tachycardia/bradycardia: Remains in sinus rhythm on monitor. Continue carvedilol. 7. Acute on stage III chronic kidney disease. Creatinine stable. 8. Acute lower GI bleed: Resolved. Unclear etiology but has history of diverticulosis. Continue chronic PPI use. 9. Acute blood loss anemia due to transient lower GI bleed. Hemoglobin stable. 10. History of alcoholic cirrhosis 11. Mild leukocytosis: No clinical focus of sepsis. Urine culture negative. Has been off antibiotics. 12. Type II DM with renal complications: Fluctuating and mildly uncontrolled CBG is. Continue SSI. 13. Altered mental status: Resolved. Minimize sedative/opioids.   Code Status: Full Family Communication: None at bedside Disposition Plan: SNF when medically stable.   Consultants:  Critical care medicine  Procedures:  Central line-DC'd  Extubated  Antibiotics:  None currently   Subjective: Denies complaints. States that her abdominal size is the usual. Denies BM since admission. Denies abdominal pain, nausea or vomiting. Nursing concerned regarding patient's abdominal size.  Objective: Filed Vitals:   06/18/14 0900 06/18/14 1300 06/18/14 1500 06/18/14 1641  BP: 98/51 112/92 120/65     Pulse: 92 108 105   Temp:  99.2 F (37.3 C)    TempSrc:  Oral    Resp:  18    Height:      Weight:      SpO2:  92%  95%    Intake/Output Summary (Last 24 hours) at 06/18/14 1859 Last data filed at 06/18/14 1308  Gross per 24 hour  Intake      3 ml  Output    700 ml  Net   -697 ml   Filed Weights   06/15/14 2000 06/16/14 0500 06/18/14 0601  Weight: 71.3 kg (157 lb 3 oz) 71 kg (156 lb 8.4 oz) 71.1 kg (156 lb 12 oz)     Exam:  General exam: Pleasant middle-aged female looks older than stated age, lying comfortably in bed Respiratory system: Clear. No increased work of breathing. Cardiovascular system: S1 & S2 heard, RRR. No JVD, murmurs, gallops, clicks or pedal edema. Telemetry: Sinus rhythm. Gastrointestinal system: Abdomen is obese/?? Distended, soft and nontender. Normal bowel sounds heard. Central nervous system: Alert and oriented. No focal neurological deficits. Extremities: Symmetric 5 x 5 power.   Data Reviewed: Basic Metabolic Panel:  Recent Labs Lab 06/14/14 0455 06/14/14 0900 06/14/14 1027 06/15/14 0409 06/16/14 0545  NA 138 142 138 140 140  K 4.2 3.7 4.1 3.8 3.8  CL 106 111 106 111 108  CO2 20 19 20  18* 20  GLUCOSE 264* 150* 212* 174* 203*  BUN 18 18 16 15 15   CREATININE 1.20* 1.30* 1.11* 1.12* 1.27*  CALCIUM 8.9 8.2* 8.2* 7.5* 8.1*   Liver Function Tests:  Recent Labs Lab 06/14/14 0112 06/14/14 0900  AST 23 16  ALT 14 11  ALKPHOS 89 67  BILITOT <0.2* <0.2*  PROT 7.5 5.5*  ALBUMIN 3.2*  2.4*   No results found for this basename: LIPASE, AMYLASE,  in the last 168 hours  Recent Labs Lab 06/14/14 0900 06/16/14 0830  AMMONIA 80* 26   CBC:  Recent Labs Lab 06/14/14 1456 06/14/14 1900 06/15/14 0409 06/15/14 0925 06/16/14 0545  WBC 12.8* 14.1* 13.5* 15.9* 12.9*  HGB 10.8* 10.1* 9.6* 10.1* 9.4*  HCT 33.3* 31.5* 29.5* 30.9* 28.6*  MCV 81.6 81.0 80.2 80.9 79.9  PLT 219 226 201 227 198   Cardiac Enzymes:  Recent Labs Lab  06/14/14 0900 06/14/14 1027 06/14/14 1627 06/15/14 0103  TROPONINI <0.30 <0.30 <0.30 <0.30   BNP (last 3 results) No results found for this basename: PROBNP,  in the last 8760 hours CBG:  Recent Labs Lab 06/17/14 1624 06/17/14 2030 06/18/14 0745 06/18/14 1131 06/18/14 1651  GLUCAP 159* 215* 168* 211* 94    Recent Results (from the past 240 hour(s))  URINE CULTURE     Status: None   Collection Time    06/14/14 10:27 AM      Result Value Ref Range Status   Specimen Description URINE, CATHETERIZED   Final   Special Requests NONE   Final   Culture  Setup Time     Final   Value: 06/14/2014 19:36     Performed at [facility]     Final   Value: NO GROWTH     Performed at [facility]   Culture     Final   Value: NO GROWTH     Performed at [facility]   Report Status 06/15/2014 FINAL   Final  MRSA PCR SCREENING     Status: None   Collection Time    06/14/14 10:37 AM      Result Value Ref Range Status   MRSA by PCR NEGATIVE  NEGATIVE Final   Comment:            The GeneXpert MRSA Assay (FDA     approved for NASAL specimens     only), is one component of a     comprehensive MRSA colonization     surveillance program. It is not     intended to diagnose MRSA     infection nor to guide or     monitor treatment for     MRSA infections.       Studies: No results found.      Scheduled Meds: . allopurinol  100 mg Oral BID  . carvedilol  6.25 mg Oral BID WC  . cloNIDine  0.1 mg Oral TID  . folic acid  1 mg Oral Daily  . furosemide  40 mg Oral Daily  . insulin aspart  0-15 Units Subcutaneous TID WC  . insulin aspart  0-5 Units Subcutaneous QHS  . ipratropium-albuterol  3 mL Nebulization Q6H  . pantoprazole  40 mg Oral Daily  . sodium chloride  3 mL Intravenous Q12H   Continuous Infusions: . dextrose 5 % and 0.45 % NaCl with KCl 20 mEq/L Stopped (06/16/14 0900)    Active Problems:   GI bleeding   GI bleed   Acute  encephalopathy    Time spent: 25 minutes    Yurianna Tusing, MD, FACP, FHM. [facility] Pager [phone]  If 7PM-7AM, please contact night-coverage [email or web address] Password [email or web address] 06/18/2014, 6:59 PM    LOS: 4 days

## 2014-06-18 NOTE — Progress Notes (Signed)
CSW (Clinical Education officer, museum) provided pt with bed offers. Pt will review and talk to her friends/family and make a decision. Pt agreeable to notifying CSW of decision tomorrow morning.  [place], [place]

## 2014-06-19 DIAGNOSIS — N183 Chronic kidney disease, stage 3 (moderate): Secondary | ICD-10-CM

## 2014-06-19 LAB — BASIC METABOLIC PANEL
Anion gap: 13 (ref 5–15)
BUN: 35 mg/dL — AB (ref 6–23)
CALCIUM: 8.7 mg/dL (ref 8.4–10.5)
CO2: 23 mEq/L (ref 19–32)
Chloride: 98 mEq/L (ref 96–112)
Creatinine, Ser: 1.41 mg/dL — ABNORMAL HIGH (ref 0.50–1.10)
GFR calc Af Amer: 43 mL/min — ABNORMAL LOW (ref >90)
GFR, EST NON AFRICAN AMERICAN: 37 mL/min — AB (ref >90)
GLUCOSE: 184 mg/dL — AB (ref 70–99)
Potassium: 4 mEq/L (ref 3.7–5.3)
SODIUM: 134 meq/L — AB (ref 137–147)

## 2014-06-19 LAB — CBC
HCT: 26.5 % — ABNORMAL LOW (ref 36.0–46.0)
HCT: 30.2 % — ABNORMAL LOW (ref 36.0–46.0)
HEMOGLOBIN: 8.5 g/dL — AB (ref 12.0–15.0)
HEMOGLOBIN: 9.5 g/dL — AB (ref 12.0–15.0)
MCH: 25.9 pg — ABNORMAL LOW (ref 26.0–34.0)
MCH: 25.8 pg — AB (ref 26.0–34.0)
MCHC: 32.1 g/dL (ref 30.0–36.0)
MCHC: 31.5 g/dL (ref 30.0–36.0)
MCV: 80.8 fL (ref 78.0–100.0)
MCV: 82.1 fL (ref 78.0–100.0)
Platelets: 248 10*3/uL (ref 150–400)
Platelets: 251 10*3/uL (ref 150–400)
RBC: 3.28 MIL/uL — ABNORMAL LOW (ref 3.87–5.11)
RBC: 3.68 MIL/uL — ABNORMAL LOW (ref 3.87–5.11)
RDW: 15.7 % — AB (ref 11.5–15.5)
RDW: 15.6 % — ABNORMAL HIGH (ref 11.5–15.5)
WBC: 10.4 10*3/uL (ref 4.0–10.5)
WBC: 9.2 10*3/uL (ref 4.0–10.5)

## 2014-06-19 LAB — GLUCOSE, CAPILLARY
Glucose-Capillary: 168 mg/dL — ABNORMAL HIGH (ref 70–99)
Glucose-Capillary: 218 mg/dL — ABNORMAL HIGH (ref 70–99)

## 2014-06-19 MED ORDER — CARVEDILOL 6.25 MG PO TABS: 6.2500 mg | ORAL_TABLET | Freq: Two times a day (BID) | ORAL | Status: DC

## 2014-06-19 MED ORDER — CLONIDINE HCL 0.1 MG PO TABS: 0.1000 mg | ORAL_TABLET | Freq: Three times a day (TID) | ORAL | Status: DC

## 2014-06-19 MED ORDER — FUROSEMIDE 40 MG PO TABS: 40.0000 mg | ORAL_TABLET | Freq: Every day | ORAL | Status: DC

## 2014-06-19 NOTE — Progress Notes (Signed)
Pt discharged to skilled nursing facility via stretcher per EMS, condition stable, report called.

## 2014-06-19 NOTE — Progress Notes (Signed)
UR complete.  Jacinto Keil RN, MSN 

## 2014-06-19 NOTE — Progress Notes (Signed)
CSW (Clinical Education officer, museum) notified facility representative that pt is ready for dc today. Facility confirmed they can accept pt today.  [place], [place]

## 2014-06-19 NOTE — Progress Notes (Signed)
CSW (Clinical Social Worker) prepared pt dc packet and placed with shadow chart. CSW arranged non-emergent ambulance transport. Pt, pt family, pt nurse, and facility informed. CSW signing off.  Jerry Clyne, LCSWA [phone]  

## 2014-06-19 NOTE — Discharge Summary (Signed)
Physician Discharge Summary  [name] LPF:[id number] DOB: 1946/07/19 DOA: 06/14/2014  PCP: Philis Fendt, MD  Admit date: 06/14/2014 Discharge date: 06/19/2014  Time spent: Greater than 30 minutes  Recommendations for Outpatient Follow-up:  1. PCP or M.D. at SNF in days with repeat labs (CBC & BMP). Decision to be made regarding resuming Lasix which is currently on hold. 2. Dr. Wilford Corner, GI 3. Recommend repeating chest x-ray in 4-6 weeks to reassess atelectasis on recent chest x-ray.  Discharge Diagnoses:  Active Problems:   GI bleeding   GI bleed   Acute encephalopathy   Discharge Condition: Improved & Stable  Diet recommendation: Heart healthy and diabetic diet.  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 She was admitted to the floor and quickly deteriorated with altered mental status and required transfer to ICU and intubation. After she was stabilized, her care was transferred back to the floor.  Hospital Course:   1. Transient hematochezia: GI was consulted and favor this being a diverticular bleed since there was no rise in BUN and the patient has now likely cause for upper tract bleeding even though she was taking 81 mg of aspirin but was on PPI. Moreover there was no blood from her NG tube. GI recommended close monitoring and if she had  recurrent active bleeding then consider bleeding scan for localization. They did not think that there was sufficient suspicion of an upper tract source of bleeding to justify EGD. She also has known left-sided diverticular disease and recent colonoscopy showed no other sources of bleeding and GI did not feel that repeating the colonoscopy at this time would shed more light and the patient's problem. No further overt rectal bleeding. Patient had a large normal BM yesterday. Continue PPI. Monitor outpatient. 2. Acute on chronic anemia: May have had post hemorrhagic complement. No evidence of overt bleeding. Hemoglobin has been in the 9.5-10 range until today when it's 8.5. This may be a lab variation. Will repeat prior to discharge to confirm stability and needs to be closely monitored as outpatient. 3. History of "cirrhosis": GI was not convinced of this diagnosis in the absence of prior endoscopic features or biochemical evidence. Abdominal ultrasound however suggests cirrhosis. Outpatient followup. 4. History of left-sided diverticulosis 5. Status post right hemicolectomy for large villous adenoma in 2002 6. Acute respiratory failure: Related to altered mental status. No evidence of COPD exacerbation. Stable status post extubation. Resolved. 7. COPD: Stable. 8. Hypotension: Resolved. 9. Hypertension: Controlled. Continue carvedilol and consider gradually tapering clonidine that was started. 10. Chronic diastolic CHF: Clinically appears compensated. Creatinine has increased slightly from 1.2-1.4 range. Hold Lasix temporarily and resume outpatient based on improvement in creatinine and will require close followup. 11. Intermittent tachycardia/bradycardia: Started on carvedilol and uptitrated. Remains in sinus rhythm-mild sinus tachycardia in the 100s. 12. Acute on stage III chronic kidney disease: Baseline creatinine may be  in the 1.2-1.3 range. Has slightly increased while on Lasix which has been held.  Monitor BMP closely. 13. Mild leukocytosis: No clinical focus of sepsis. Urine cultures negative. Has been off antibiotics. 14. Type II DM with renal complications: CBGs fluctuating. Resume low dose nightly Levemir. Continue to hold oral hypoglycemics secondary to renal insufficiency and to avoid hypoglycemia. Close monitoring and management as outpatient. 15. Altered mental status: Resolved.  Consultations:  Gastroenterology  Critical care medicine  Procedures:  Intubation for mechanical ventilation  Central line    Discharge Exam:  Complaints:  Patient denies complaints. States that her abdominal size is the usual. Denies abdominal pain, nausea or vomiting. Had a large normal BM yesterday.  Filed Vitals:   06/18/14 2100 06/19/14 0146 06/19/14 0500 06/19/14 1024  BP: 129/69  118/64 109/63  Pulse: 94  84   Temp: 98.9 F (37.2 C)  97.5 F (36.4 C)   TempSrc: Oral  Oral   Resp: 18  18   Height:      Weight:   68.402 kg (150 lb 12.8 oz)   SpO2: 96% 97% 96%    General exam: Pleasant middle-aged female looks older than stated age, lying comfortably in bed  Respiratory system: Clear. No increased work of breathing.  Cardiovascular system: S1 & S2 heard, RRR. No JVD, murmurs, gallops, clicks or pedal edema. Telemetry: Sinus rhythm- ST 110's.  Gastrointestinal system: Abdomen is obese, soft and nontender. Normal bowel sounds heard.  Central nervous system: Alert and oriented. No focal neurological deficits.  Extremities: Symmetric 5 x 5 power.   Discharge Instructions      Discharge Instructions   (HEART FAILURE PATIENTS) Call MD:  Anytime you have any of the following symptoms: 1) 3 pound weight gain in 24 hours or 5 pounds in 1 week 2) shortness of breath, with or without a dry hacking cough 3) swelling in the hands, feet or stomach 4) if you have to sleep on extra pillows at night in order to breathe.    Complete by:  As directed      Call MD for:    Complete by:  As  directed   Rectal bleeding     Diet - low sodium heart healthy    Complete by:  As directed      Diet Carb Modified    Complete by:  As directed      Increase activity slowly    Complete by:  As directed             Medication List    STOP taking these medications       diphenhydrAMINE 25 mg capsule  Commonly known as:  BENADRYL     furosemide 40 MG tablet  Commonly known as:  LASIX     glimepiride 4 MG tablet  Commonly known as:  AMARYL     lidocaine 5 %  Commonly known as:  LIDODERM     methocarbamol 500 MG tablet  Commonly known as:  ROBAXIN     metoprolol 50 MG tablet  Commonly known as:  LOPRESSOR     oxyCODONE 5 MG immediate release tablet  Commonly known as:  Oxy IR/ROXICODONE     sitaGLIPtin 50 MG tablet  Commonly known as:  JANUVIA      TAKE these medications       albuterol 108 (90 BASE) MCG/ACT inhaler  Commonly known as:  PROVENTIL HFA;VENTOLIN HFA  Inhale 2 puffs into the lungs every 6 (six) hours as  needed. For shortness of breath     albuterol (2.5 MG/3ML) 0.083% nebulizer solution  Commonly known as:  PROVENTIL  Take 2.5 mg by nebulization every 6 (six) hours as needed for wheezing or shortness of breath.     allopurinol 100 MG tablet  Commonly known as:  ZYLOPRIM  Take 100 mg by mouth 2 (two) times daily.     aspirin 81 MG EC tablet  Take 81 mg by mouth daily.     BLINK TEARS OP  Place 1 drop into the right eye daily as needed (dryness).     carvedilol 6.25 MG tablet  Commonly known as:  COREG  Take 1 tablet (6.25 mg total) by mouth 2 (two) times daily with a meal.     cilostazol 100 MG tablet  Commonly known as:  PLETAL  Take 50 mg by mouth 2 (two) times daily before a meal.     cloNIDine 0.1 MG tablet  Commonly known as:  CATAPRES  Take 1 tablet (0.1 mg total) by mouth 3 (three) times daily.     diclofenac sodium 1 % Gel  Commonly known as:  VOLTAREN  Apply 1 application topically daily as needed (leg pain). For leg pain      ergocalciferol 50000 UNITS capsule  Commonly known as:  VITAMIN D2  Take 50,000 Units by mouth once a week. On Monday     fluticasone 50 MCG/ACT nasal spray  Commonly known as:  FLONASE  Place 2 sprays into the nose daily. 50 mcg /act 2 sprays nasal once a day     folic acid 1 MG tablet  Commonly known as:  FOLVITE  Take 1 mg by mouth daily.     LEVEMIR FLEXTOUCH 100 UNIT/ML Pen  Generic drug:  Insulin Detemir  Inject 8 Units into the skin daily at 10 pm.     montelukast 10 MG tablet  Commonly known as:  SINGULAIR  Take 10 mg by mouth at bedtime.     omeprazole 40 MG capsule  Commonly known as:  PRILOSEC  Take 40 mg by mouth daily.     OXYGEN  Inhale into the lungs. 2 liters nightly     triamcinolone cream 0.5 %  Commonly known as:  KENALOG  Apply 1 application topically daily as needed. For legs       Follow-up Information   Follow up with PCP or M.D. at SNF.       The results of significant diagnostics from this hospitalization (including imaging, microbiology, ancillary and laboratory) are listed below for reference.    Significant Diagnostic Studies: US Abdomen Port  06/14/2014   CLINICAL DATA:  History of cirrhosis. Altered mental status. Hypotension and abdominal distention. Gastrointestinal bleeding.  EXAM: ULTRASOUND PORTABLE ABDOMEN  COMPARISON:  CT abdomen and pelvis 01/13/2012. MRI abdomen 02/17/2012.  FINDINGS: Gallbladder: No gallstones or wall thickening visualized. No sonographic Murphy sign noted.  Common bile duct: Diameter: 0.4 cm  Liver: The liver appears shrunken compatible with history of cirrhosis. No focal liver lesion is identified. There is hepatopetal flow of the portal vein.  IVC: No abnormality visualized.  Pancreas: Very poorly seen.  No focal lesion identified.  Spleen: Size and appearance within normal limits.  Right Kidney: Length: 9.9 cm. Cortical echogenicity is increased. No hydronephrosis. 1.1 cm simple cyst noted.  Left Kidney:  Length: 9.9 cm. There is increased cortical echogenicity. No hydronephrosis or mass.  Abdominal aorta: No aneurysm visualized.  Other findings: None.  IMPRESSION: No acute  abnormality.  Findings compatible with medical renal disease.  The liver appears cirrhotic.   Electronically Signed   By: Inge Rise M.D.   On: 06/14/2014 11:22   Dg Chest Port 1 View  06/15/2014   CLINICAL DATA:  Respiratory failure  EXAM: PORTABLE CHEST - 1 VIEW  COMPARISON:  06/14/2014  FINDINGS: Endotracheal tube tip is above the carina. There is a left IJ catheter with tip in the cavoatrial junction. NG tube tip is in the stomach. Normal heart size. Similar appearance of small pleural effusions and bibasilar atelectasis.  IMPRESSION: No change in small pleural effusions and bibasilar atelectasis.   Electronically Signed   By: Kerby Moors M.D.   On: 06/15/2014 07:54   Dg Chest Port 1 View  06/14/2014   CLINICAL DATA:  Chronic cough  EXAM: PORTABLE CHEST - 1 VIEW  COMPARISON:  Radiograph 11/17/2012  FINDINGS: Normal cardiac silhouette. There is interval improvement bilateral pleural effusions from remote exam. There is left lower lobe segmental opacity. No pneumothorax.  IMPRESSION: Left lower lobe opacity representing atelectasis or infiltrate. Recommend follow-up radiographs to ensure resolution.   Electronically Signed   By: Suzy Bouchard M.D.   On: 06/14/2014 08:45   Dg Chest Port 1v Same Day  06/14/2014   CLINICAL DATA:  Assess central line placement.  EXAM: PORTABLE CHEST - 1 VIEW SAME DAY  COMPARISON:  06/14/2014 at 6:38 a.m.  FINDINGS: Endotracheal tube has tip within the origin of the right mainstem bronchus. Nasogastric tube tip is not seen although the side port is over the stomach in the left upper quadrant. Left IJ central venous catheter has tip overlying the SVC.  Lungs are hypoinflated with continued left basilar opacification likely atelectasis although cannot exclude early infection. No evidence of  pneumothorax. Cannot exclude a smaller left pleural fluid. Mild prominence of the perihilar markings likely due in part to the degree of hypoinflation. Cardiomediastinal silhouette and remainder of the exam is unchanged.  IMPRESSION: Left basilar opacification which likely represents atelectasis although cannot exclude infection. Cannot exclude a small amount left pleural fluid.  Tubes and lines as described. Note that the endotracheal tube has tip over the origin of the right mainstem bronchus.  These results were called by telephone at the time of interpretation on 06/14/2014 at 10:52 am to patient's nurse, Jeronimo Greaves,, who verbally acknowledged these results.   Electronically Signed   By: Marin Olp M.D.   On: 06/14/2014 10:53   Dg Abd Portable 1v  06/18/2014   CLINICAL DATA:  Constipation for 5 days.  Subsequent encounter.  EXAM: PORTABLE ABDOMEN - 1 VIEW  COMPARISON:  Radiographs 06/15/2014 and 11/17/2012.  FINDINGS: 1924 hr. The nasogastric tube has been removed. The bowel gas pattern is nonobstructive. There is stool within the right colon. Bilateral iliac stents are noted. There are scattered vascular calcifications. Bibasilar pulmonary opacities appear unchanged.  IMPRESSION: No acute abdominal findings demonstrated following nasogastric tube removal.   Electronically Signed   By: Camie Patience M.D.   On: 06/18/2014 19:38   Dg Abd Portable 1v  06/15/2014   CLINICAL DATA:  Nasogastric tube placement  EXAM: PORTABLE ABDOMEN - 1 VIEW  COMPARISON:  None.  FINDINGS: Nasogastric tube with the tip projecting over the fundus of the stomach. There is no bowel dilatation to suggest obstruction. There is no evidence of pneumoperitoneum, portal venous gas or pneumatosis. There are no pathologic calcifications along the expected course of the ureters. Bilateral iliac artery stents are noted. The  osseous structures are unremarkable.  IMPRESSION: Nasogastric tube with the tip projecting over the fundus of the  stomach.   Electronically Signed   By: Kathreen Devoid   On: 06/15/2014 14:12   Ct Portable Head W/o Cm  06/14/2014   CLINICAL DATA:  Acute hypoxia. Altered mental status. Status post intubation.  EXAM: CT HEAD WITHOUT CONTRAST  TECHNIQUE: Contiguous axial images were obtained from the base of the skull through the vertex without intravenous contrast.  COMPARISON:  04/28/2011.  FINDINGS: The head is suboptimally positioned within the gantry with almost direct coronal imaging. Grossly normal appearing cerebral hemispheres and posterior fossa structures. Normal size and position of the ventricles. No visible intracranial hemorrhage, mass lesion or CT evidence of acute infarction.  IMPRESSION: Limited examination due to suboptimal positioning. No visible abnormality.   Electronically Signed   By: Enrique Sack M.D.   On: 06/14/2014 14:56    Microbiology: Recent Results (from the past 240 hour(s))  URINE CULTURE     Status: None   Collection Time    06/14/14 10:27 AM      Result Value Ref Range Status   Specimen Description URINE, CATHETERIZED   Final   Special Requests NONE   Final   Culture  Setup Time     Final   Value: 06/14/2014 19:36     Performed at [facility]     Final   Value: NO GROWTH     Performed at [facility]   Culture     Final   Value: NO GROWTH     Performed at [facility]   Report Status 06/15/2014 FINAL   Final  MRSA PCR SCREENING     Status: None   Collection Time    06/14/14 10:37 AM      Result Value Ref Range Status   MRSA by PCR NEGATIVE  NEGATIVE Final   Comment:            The GeneXpert MRSA Assay (FDA     approved for NASAL specimens     only), is one component of a     comprehensive MRSA colonization     surveillance program. It is not     intended to diagnose MRSA     infection nor to guide or     monitor treatment for     MRSA infections.     Labs: Basic Metabolic Panel:  Recent Labs Lab 06/14/14 0900  06/14/14 1027 06/15/14 0409 06/16/14 0545 06/19/14 0430  NA 142 138 140 140 134*  K 3.7 4.1 3.8 3.8 4.0  CL 111 106 111 108 98  CO2 19 20 18* 20 23  GLUCOSE 150* 212* 174* 203* 184*  BUN 18 16 15 15  35*  CREATININE 1.30* 1.11* 1.12* 1.27* 1.41*  CALCIUM 8.2* 8.2* 7.5* 8.1* 8.7   Liver Function Tests:  Recent Labs Lab 06/14/14 0112 06/14/14 0900  AST 23 16  ALT 14 11  ALKPHOS 89 67  BILITOT <0.2* <0.2*  PROT 7.5 5.5*  ALBUMIN 3.2* 2.4*   No results found for this basename: LIPASE, AMYLASE,  in the last 168 hours  Recent Labs Lab 06/14/14 0900 06/16/14 0830  AMMONIA 80* 26   CBC:  Recent Labs Lab 06/14/14 1900 06/15/14 0409 06/15/14 0925 06/16/14 0545 06/19/14 0430  WBC 14.1* 13.5* 15.9* 12.9* 10.4  HGB 10.1* 9.6* 10.1* 9.4* 8.5*  HCT 31.5* 29.5* 30.9* 28.6* 26.5*  MCV 81.0 80.2 80.9  79.9 80.8  PLT 226 201 227 198 248   Cardiac Enzymes:  Recent Labs Lab 06/14/14 0900 06/14/14 1027 06/14/14 1627 06/15/14 0103  TROPONINI <0.30 <0.30 <0.30 <0.30   BNP: BNP (last 3 results) No results found for this basename: PROBNP,  in the last 8760 hours CBG:  Recent Labs Lab 06/18/14 1131 06/18/14 1651 06/18/14 2130 06/19/14 0722 06/19/14 1111  GLUCAP 211* 94 153* 168* 218*       Signed:  Vernell Leep, MD, FACP, FHM. [facility] Pager [phone]  If 7PM-7AM, please contact night-coverage [email or web address] Password [email or web address] 06/19/2014, 1:12 PM

## 2014-07-26 ENCOUNTER — Emergency Department (HOSPITAL_COMMUNITY): Payer: PRIVATE HEALTH INSURANCE

## 2014-07-26 ENCOUNTER — Inpatient Hospital Stay (HOSPITAL_COMMUNITY)
Admission: EM | Admit: 2014-07-26 | Discharge: 2014-07-30 | DRG: 391 | Disposition: A | Discharge status: Home Health | Payer: PRIVATE HEALTH INSURANCE | Attending: Internal Medicine | Admitting: Internal Medicine

## 2014-07-26 ENCOUNTER — Encounter (HOSPITAL_COMMUNITY): Payer: Self-pay | Admitting: Vascular Surgery

## 2014-07-26 DIAGNOSIS — E873 Alkalosis: Secondary | ICD-10-CM | POA: Diagnosis present

## 2014-07-26 DIAGNOSIS — R0789 Other chest pain: Secondary | ICD-10-CM | POA: Diagnosis present

## 2014-07-26 DIAGNOSIS — E1122 Type 2 diabetes mellitus with diabetic chronic kidney disease: Secondary | ICD-10-CM | POA: Diagnosis present

## 2014-07-26 DIAGNOSIS — A084 Viral intestinal infection, unspecified: Principal | ICD-10-CM | POA: Diagnosis present

## 2014-07-26 DIAGNOSIS — K5791 Diverticulosis of intestine, part unspecified, without perforation or abscess with bleeding: Secondary | ICD-10-CM | POA: Diagnosis present

## 2014-07-26 DIAGNOSIS — R1032 Left lower quadrant pain: Secondary | ICD-10-CM

## 2014-07-26 DIAGNOSIS — E1165 Type 2 diabetes mellitus with hyperglycemia: Secondary | ICD-10-CM | POA: Diagnosis present

## 2014-07-26 DIAGNOSIS — K55059 Acute (reversible) ischemia of intestine, part and extent unspecified: Secondary | ICD-10-CM

## 2014-07-26 DIAGNOSIS — K219 Gastro-esophageal reflux disease without esophagitis: Secondary | ICD-10-CM | POA: Diagnosis present

## 2014-07-26 DIAGNOSIS — D649 Anemia, unspecified: Secondary | ICD-10-CM | POA: Diagnosis present

## 2014-07-26 DIAGNOSIS — G4730 Sleep apnea, unspecified: Secondary | ICD-10-CM | POA: Diagnosis present

## 2014-07-26 DIAGNOSIS — J449 Chronic obstructive pulmonary disease, unspecified: Secondary | ICD-10-CM | POA: Diagnosis present

## 2014-07-26 DIAGNOSIS — Z888 Allergy status to other drugs, medicaments and biological substances status: Secondary | ICD-10-CM

## 2014-07-26 DIAGNOSIS — E1310 Other specified diabetes mellitus with ketoacidosis without coma: Secondary | ICD-10-CM | POA: Diagnosis present

## 2014-07-26 DIAGNOSIS — Z7902 Long term (current) use of antithrombotics/antiplatelets: Secondary | ICD-10-CM

## 2014-07-26 DIAGNOSIS — Z7982 Long term (current) use of aspirin: Secondary | ICD-10-CM

## 2014-07-26 DIAGNOSIS — I5032 Chronic diastolic (congestive) heart failure: Secondary | ICD-10-CM | POA: Diagnosis present

## 2014-07-26 DIAGNOSIS — N183 Chronic kidney disease, stage 3 (moderate): Secondary | ICD-10-CM | POA: Diagnosis present

## 2014-07-26 DIAGNOSIS — Z9981 Dependence on supplemental oxygen: Secondary | ICD-10-CM | POA: Diagnosis not present

## 2014-07-26 DIAGNOSIS — J441 Chronic obstructive pulmonary disease with (acute) exacerbation: Secondary | ICD-10-CM | POA: Diagnosis present

## 2014-07-26 DIAGNOSIS — R1031 Right lower quadrant pain: Secondary | ICD-10-CM | POA: Diagnosis present

## 2014-07-26 DIAGNOSIS — Z9049 Acquired absence of other specified parts of digestive tract: Secondary | ICD-10-CM | POA: Diagnosis present

## 2014-07-26 DIAGNOSIS — Z95820 Peripheral vascular angioplasty status with implants and grafts: Secondary | ICD-10-CM

## 2014-07-26 DIAGNOSIS — Z880 Allergy status to penicillin: Secondary | ICD-10-CM | POA: Diagnosis not present

## 2014-07-26 DIAGNOSIS — K279 Peptic ulcer, site unspecified, unspecified as acute or chronic, without hemorrhage or perforation: Secondary | ICD-10-CM | POA: Diagnosis present

## 2014-07-26 DIAGNOSIS — M109 Gout, unspecified: Secondary | ICD-10-CM | POA: Diagnosis present

## 2014-07-26 DIAGNOSIS — R109 Unspecified abdominal pain: Secondary | ICD-10-CM

## 2014-07-26 DIAGNOSIS — J45909 Unspecified asthma, uncomplicated: Secondary | ICD-10-CM | POA: Diagnosis present

## 2014-07-26 DIAGNOSIS — Z7951 Long term (current) use of inhaled steroids: Secondary | ICD-10-CM | POA: Diagnosis not present

## 2014-07-26 DIAGNOSIS — K7030 Alcoholic cirrhosis of liver without ascites: Secondary | ICD-10-CM | POA: Diagnosis present

## 2014-07-26 DIAGNOSIS — Z87891 Personal history of nicotine dependence: Secondary | ICD-10-CM | POA: Diagnosis not present

## 2014-07-26 DIAGNOSIS — Z794 Long term (current) use of insulin: Secondary | ICD-10-CM | POA: Diagnosis not present

## 2014-07-26 DIAGNOSIS — I739 Peripheral vascular disease, unspecified: Secondary | ICD-10-CM | POA: Diagnosis present

## 2014-07-26 DIAGNOSIS — E118 Type 2 diabetes mellitus with unspecified complications: Secondary | ICD-10-CM | POA: Diagnosis present

## 2014-07-26 DIAGNOSIS — R05 Cough: Secondary | ICD-10-CM | POA: Diagnosis present

## 2014-07-26 DIAGNOSIS — I129 Hypertensive chronic kidney disease with stage 1 through stage 4 chronic kidney disease, or unspecified chronic kidney disease: Secondary | ICD-10-CM | POA: Diagnosis present

## 2014-07-26 DIAGNOSIS — K529 Noninfective gastroenteritis and colitis, unspecified: Secondary | ICD-10-CM | POA: Diagnosis present

## 2014-07-26 DIAGNOSIS — N179 Acute kidney failure, unspecified: Secondary | ICD-10-CM | POA: Diagnosis present

## 2014-07-26 LAB — BASIC METABOLIC PANEL
Anion gap: 16 — ABNORMAL HIGH (ref 5–15)
Anion gap: 17 — ABNORMAL HIGH (ref 5–15)
BUN: 9 mg/dL (ref 6–23)
BUN: 9 mg/dL (ref 6–23)
CALCIUM: 8.9 mg/dL (ref 8.4–10.5)
CHLORIDE: 102 meq/L (ref 96–112)
CO2: 22 meq/L (ref 19–32)
CO2: 22 mEq/L (ref 19–32)
Calcium: 8.6 mg/dL (ref 8.4–10.5)
Chloride: 102 mEq/L (ref 96–112)
Creatinine, Ser: 0.92 mg/dL (ref 0.50–1.10)
Creatinine, Ser: 0.91 mg/dL (ref 0.50–1.10)
GFR calc Af Amer: 72 mL/min — ABNORMAL LOW (ref >90)
GFR calc Af Amer: 73 mL/min — ABNORMAL LOW (ref >90)
GFR calc non Af Amer: 62 mL/min — ABNORMAL LOW (ref >90)
GFR calc non Af Amer: 63 mL/min — ABNORMAL LOW (ref >90)
GLUCOSE: 298 mg/dL — AB (ref 70–99)
GLUCOSE: 204 mg/dL — AB (ref 70–99)
Potassium: 3.1 mEq/L — ABNORMAL LOW (ref 3.7–5.3)
Potassium: 3.7 mEq/L (ref 3.7–5.3)
SODIUM: 141 meq/L (ref 137–147)
Sodium: 140 mEq/L (ref 137–147)

## 2014-07-26 LAB — CBC WITH DIFFERENTIAL/PLATELET
BASOS ABS: 0 10*3/uL (ref 0.0–0.1)
BASOS ABS: 0 10*3/uL (ref 0.0–0.1)
BASOS PCT: 0 % (ref 0–1)
Basophils Relative: 0 % (ref 0–1)
EOS PCT: 0 % (ref 0–5)
EOS PCT: 0 % (ref 0–5)
Eosinophils Absolute: 0 10*3/uL (ref 0.0–0.7)
Eosinophils Absolute: 0 10*3/uL (ref 0.0–0.7)
HCT: 35 % — ABNORMAL LOW (ref 36.0–46.0)
HEMATOCRIT: 33.8 % — AB (ref 36.0–46.0)
HEMOGLOBIN: 10.5 g/dL — AB (ref 12.0–15.0)
Hemoglobin: 10.9 g/dL — ABNORMAL LOW (ref 12.0–15.0)
Lymphocytes Relative: 13 % (ref 12–46)
Lymphocytes Relative: 9 % — ABNORMAL LOW (ref 12–46)
Lymphs Abs: 1.8 10*3/uL (ref 0.7–4.0)
Lymphs Abs: 1.2 10*3/uL (ref 0.7–4.0)
MCH: 25.6 pg — ABNORMAL LOW (ref 26.0–34.0)
MCH: 25 pg — AB (ref 26.0–34.0)
MCHC: 31.1 g/dL (ref 30.0–36.0)
MCHC: 31.1 g/dL (ref 30.0–36.0)
MCV: 82.2 fL (ref 78.0–100.0)
MCV: 80.5 fL (ref 78.0–100.0)
MONO ABS: 0.3 10*3/uL (ref 0.1–1.0)
MONO ABS: 0.2 10*3/uL (ref 0.1–1.0)
MONOS PCT: 2 % — AB (ref 3–12)
Monocytes Relative: 3 % (ref 3–12)
NEUTROS ABS: 12.1 10*3/uL — AB (ref 1.7–7.7)
Neutro Abs: 11.5 10*3/uL — ABNORMAL HIGH (ref 1.7–7.7)
Neutrophils Relative %: 84 % — ABNORMAL HIGH (ref 43–77)
Neutrophils Relative %: 89 % — ABNORMAL HIGH (ref 43–77)
Platelets: 314 10*3/uL (ref 150–400)
Platelets: 334 10*3/uL (ref 150–400)
RBC: 4.26 MIL/uL (ref 3.87–5.11)
RBC: 4.2 MIL/uL (ref 3.87–5.11)
RDW: 15 % (ref 11.5–15.5)
RDW: 14.9 % (ref 11.5–15.5)
WBC: 13.6 10*3/uL — ABNORMAL HIGH (ref 4.0–10.5)
WBC: 13.5 10*3/uL — ABNORMAL HIGH (ref 4.0–10.5)

## 2014-07-26 LAB — COMPREHENSIVE METABOLIC PANEL
ALBUMIN: 3.3 g/dL — AB (ref 3.5–5.2)
ALT: 8 U/L (ref 0–35)
AST: 16 U/L (ref 0–37)
Alkaline Phosphatase: 91 U/L (ref 39–117)
Anion gap: 17 — ABNORMAL HIGH (ref 5–15)
BUN: 10 mg/dL (ref 6–23)
CALCIUM: 9.2 mg/dL (ref 8.4–10.5)
CO2: 22 meq/L (ref 19–32)
CREATININE: 1.02 mg/dL (ref 0.50–1.10)
Chloride: 99 mEq/L (ref 96–112)
GFR calc Af Amer: 64 mL/min — ABNORMAL LOW (ref >90)
GFR, EST NON AFRICAN AMERICAN: 55 mL/min — AB (ref >90)
Glucose, Bld: 356 mg/dL — ABNORMAL HIGH (ref 70–99)
Potassium: 3.7 mEq/L (ref 3.7–5.3)
Sodium: 138 mEq/L (ref 137–147)
Total Bilirubin: 0.4 mg/dL (ref 0.3–1.2)
Total Protein: 7 g/dL (ref 6.0–8.3)

## 2014-07-26 LAB — URINALYSIS, ROUTINE W REFLEX MICROSCOPIC
Bilirubin Urine: NEGATIVE
Glucose, UA: >1000 mg/dL — AB
Ketones, ur: 15 mg/dL — AB
Leukocytes, UA: NEGATIVE
Nitrite: NEGATIVE
PH: 6.5 (ref 5.0–8.0)
Protein, ur: 30 mg/dL — AB
Specific Gravity, Urine: 1.023 (ref 1.005–1.030)
Urobilinogen, UA: 0.2 mg/dL (ref 0.0–1.0)

## 2014-07-26 LAB — GLUCOSE, CAPILLARY
GLUCOSE-CAPILLARY: 225 mg/dL — AB (ref 70–99)
Glucose-Capillary: 200 mg/dL — ABNORMAL HIGH (ref 70–99)

## 2014-07-26 LAB — I-STAT VENOUS BLOOD GAS, ED
Acid-Base Excess: 1 mmol/L (ref 0.0–2.0)
BICARBONATE: 28 meq/L — AB (ref 20.0–24.0)
O2 Saturation: 65 %
PCO2 VEN: 53.3 mmHg — AB (ref 45.0–50.0)
PH VEN: 7.329 — AB (ref 7.250–7.300)
TCO2: 30 mmol/L (ref 0–100)
pO2, Ven: 37 mmHg (ref 30.0–45.0)

## 2014-07-26 LAB — POC OCCULT BLOOD, ED: Fecal Occult Bld: NEGATIVE

## 2014-07-26 LAB — I-STAT TROPONIN, ED: Troponin i, poc: 0 ng/mL (ref 0.00–0.08)

## 2014-07-26 LAB — I-STAT CG4 LACTIC ACID, ED: LACTIC ACID, VENOUS: 2.38 mmol/L — AB (ref 0.5–2.2)

## 2014-07-26 LAB — URINE MICROSCOPIC-ADD ON

## 2014-07-26 LAB — PROCALCITONIN: Procalcitonin: <0.1 ng/mL

## 2014-07-26 LAB — CBG MONITORING, ED
GLUCOSE-CAPILLARY: 315 mg/dL — AB (ref 70–99)
Glucose-Capillary: 310 mg/dL — ABNORMAL HIGH (ref 70–99)

## 2014-07-26 LAB — PROTIME-INR
INR: 1.08 (ref 0.00–1.49)
Prothrombin Time: 14.2 seconds (ref 11.6–15.2)

## 2014-07-26 LAB — LIPASE, BLOOD: Lipase: 8 U/L — ABNORMAL LOW (ref 11–59)

## 2014-07-26 LAB — KETONES, QUALITATIVE

## 2014-07-26 LAB — MRSA PCR SCREENING: MRSA BY PCR: NEGATIVE

## 2014-07-26 MED ORDER — CLINDAMYCIN PHOSPHATE 600 MG/50ML IV SOLN
600.0000 mg | Freq: Four times a day (QID) | INTRAVENOUS | Status: DC
  Administered 2014-07-26: 600 mg via INTRAVENOUS
  Filled 2014-07-26 (×3): qty 50

## 2014-07-26 MED ORDER — SODIUM CHLORIDE 0.9 % IV SOLN
INTRAVENOUS | Status: DC
  Administered 2014-07-28: 10:00:00 via INTRAVENOUS

## 2014-07-26 MED ORDER — SODIUM CHLORIDE 0.9 % IJ SOLN
3.0000 mL | Freq: Two times a day (BID) | INTRAMUSCULAR | Status: DC
  Administered 2014-07-27 (×2): 3 mL via INTRAVENOUS

## 2014-07-26 MED ORDER — METOPROLOL TARTRATE 1 MG/ML IV SOLN: 5.0000 mg | Freq: Four times a day (QID) | INTRAVENOUS | Status: DC

## 2014-07-26 MED ORDER — CLINDAMYCIN PHOSPHATE 600 MG/50ML IV SOLN
600.0000 mg | Freq: Three times a day (TID) | INTRAVENOUS | Status: DC
  Administered 2014-07-27 – 2014-07-29 (×7): 600 mg via INTRAVENOUS
  Filled 2014-07-26 (×9): qty 50

## 2014-07-26 MED ORDER — MORPHINE SULFATE 4 MG/ML IJ SOLN
4.0000 mg | INTRAMUSCULAR | Status: DC | PRN
  Administered 2014-07-26 – 2014-07-28 (×3): 4 mg via INTRAVENOUS
  Filled 2014-07-26 (×3): qty 1

## 2014-07-26 MED ORDER — ALBUTEROL SULFATE (2.5 MG/3ML) 0.083% IN NEBU: 2.5000 mg | INHALATION_SOLUTION | Freq: Four times a day (QID) | RESPIRATORY_TRACT | Status: DC | PRN

## 2014-07-26 MED ORDER — HYDRALAZINE HCL 20 MG/ML IJ SOLN
20.0000 mg | Freq: Four times a day (QID) | INTRAMUSCULAR | Status: DC | PRN
  Administered 2014-07-26 – 2014-07-27 (×3): 20 mg via INTRAVENOUS
  Filled 2014-07-26 (×3): qty 1

## 2014-07-26 MED ORDER — INSULIN REGULAR HUMAN 100 UNIT/ML IJ SOLN
INTRAMUSCULAR | Status: AC
  Administered 2014-07-26: 21:00:00 via INTRAVENOUS
  Filled 2014-07-26: qty 2.5

## 2014-07-26 MED ORDER — IOHEXOL 300 MG/ML  SOLN
100.0000 mL | Freq: Once | INTRAMUSCULAR | Status: AC | PRN
  Administered 2014-07-26: 100 mL via INTRAVENOUS

## 2014-07-26 MED ORDER — ONDANSETRON HCL 4 MG/2ML IJ SOLN
4.0000 mg | Freq: Once | INTRAMUSCULAR | Status: AC
  Administered 2014-07-26: 4 mg via INTRAVENOUS
  Filled 2014-07-26: qty 2

## 2014-07-26 MED ORDER — DEXTROSE-NACL 5-0.45 % IV SOLN
INTRAVENOUS | Status: DC
  Administered 2014-07-26 – 2014-07-28 (×4): via INTRAVENOUS

## 2014-07-26 MED ORDER — INSULIN ASPART 100 UNIT/ML ~~LOC~~ SOLN
10.0000 [IU] | Freq: Once | SUBCUTANEOUS | Status: AC
  Administered 2014-07-26: 10 [IU] via SUBCUTANEOUS
  Filled 2014-07-26: qty 1

## 2014-07-26 MED ORDER — IOHEXOL 300 MG/ML  SOLN
25.0000 mL | INTRAMUSCULAR | Status: DC | PRN
  Administered 2014-07-26: 25 mL via ORAL

## 2014-07-26 MED ORDER — SODIUM CHLORIDE 0.9 % IV BOLUS (SEPSIS)
1000.0000 mL | Freq: Once | INTRAVENOUS | Status: AC
  Administered 2014-07-26: 1000 mL via INTRAVENOUS

## 2014-07-26 MED ORDER — SODIUM CHLORIDE 0.9 % IV SOLN
INTRAVENOUS | Status: DC
  Administered 2014-07-26: 18:00:00 via INTRAVENOUS

## 2014-07-26 MED ORDER — CILOSTAZOL 50 MG PO TABS
50.0000 mg | ORAL_TABLET | Freq: Two times a day (BID) | ORAL | Status: DC
  Administered 2014-07-27 – 2014-07-30 (×7): 50 mg via ORAL
  Filled 2014-07-26 (×9): qty 1

## 2014-07-26 MED ORDER — POTASSIUM CHLORIDE 10 MEQ/100ML IV SOLN
10.0000 meq | INTRAVENOUS | Status: AC
  Administered 2014-07-27: 10 meq via INTRAVENOUS
  Filled 2014-07-26: qty 100

## 2014-07-26 MED ORDER — MONTELUKAST SODIUM 10 MG PO TABS
10.0000 mg | ORAL_TABLET | Freq: Every day | ORAL | Status: DC
  Administered 2014-07-27 – 2014-07-29 (×3): 10 mg via ORAL
  Filled 2014-07-26 (×5): qty 1

## 2014-07-26 MED ORDER — PANTOPRAZOLE SODIUM 40 MG PO TBEC
40.0000 mg | DELAYED_RELEASE_TABLET | Freq: Every day | ORAL | Status: DC
  Administered 2014-07-27 – 2014-07-30 (×4): 40 mg via ORAL
  Filled 2014-07-26 (×4): qty 1

## 2014-07-26 MED ORDER — MORPHINE SULFATE 2 MG/ML IJ SOLN
2.0000 mg | Freq: Once | INTRAMUSCULAR | Status: AC
  Administered 2014-07-26: 2 mg via INTRAVENOUS
  Filled 2014-07-26: qty 1

## 2014-07-26 MED ORDER — MORPHINE SULFATE 2 MG/ML IJ SOLN
2.0000 mg | INTRAMUSCULAR | Status: DC | PRN
Start: 2014-07-26 — End: 2014-07-26
  Filled 2014-07-26: qty 1

## 2014-07-26 MED ORDER — METOPROLOL TARTRATE 1 MG/ML IV SOLN
5.0000 mg | Freq: Four times a day (QID) | INTRAVENOUS | Status: DC
  Administered 2014-07-26 – 2014-07-30 (×14): 5 mg via INTRAVENOUS
  Filled 2014-07-26 (×18): qty 5

## 2014-07-26 MED ORDER — SODIUM CHLORIDE 0.9 % IV BOLUS (SEPSIS)
500.0000 mL | Freq: Once | INTRAVENOUS | Status: AC
  Administered 2014-07-26: 500 mL via INTRAVENOUS

## 2014-07-26 MED ORDER — POTASSIUM CHLORIDE 10 MEQ/100ML IV SOLN
10.0000 meq | INTRAVENOUS | Status: AC
  Administered 2014-07-26: 10 meq via INTRAVENOUS
  Filled 2014-07-26: qty 100

## 2014-07-26 NOTE — ED Provider Notes (Signed)
Emergency Ultrasound Study:   Angiocath insertion Performed by: Debby Freiberg  Consent: Verbal consent obtained. Risks and benefits: risks, benefits and alternatives were discussed Immediately prior to procedure the correct patient, procedure, equipment, support staff and site/side marked as needed.  Indication: difficult IV access Preparation: Patient was prepped and draped in the usual sterile fashion. Vein Location: L brachiocephalic vein was visualized during assessment for potential access sites and was found to be patent/ easily compressed with linear ultrasound.  The needle was visualized with real-time ultrasound and guided into the vein. Gauge: 20  Image saved and stored.  Normal blood return.  Patient tolerance: Patient tolerated the procedure well with no immediate complications.      Debby Freiberg, MD 07/26/14 [phone]

## 2014-07-26 NOTE — Progress Notes (Signed)
Per Levada Dy, patient's RN, MD does not want to wait for pt to drink PO contrast.

## 2014-07-26 NOTE — ED Notes (Signed)
CBG 310..nurse notified and doctor in room

## 2014-07-26 NOTE — ED Notes (Signed)
I Stat Lactic Acid results shown to [employer] PA

## 2014-07-26 NOTE — ED Notes (Signed)
Pt reports to the ED for eval of N/V and abdominal pain. Pt reports she had been constipated but today she had an episode of diarrhea. Pt was recently released from [facility] for rehab. Pt denies any hematemesis or blood in her stool. CBG 320 mg/dl PTA. Symptoms began this am. Localized the abdominal pain to her whole abdomen. Pt has hx of diverticulosis and reports it feels similar to when she has a flare up of her diverticulosis. Pt denies any CP or SOB. Pt denies chills and is unsure if she has had a fever. Denies any urinary symptoms. Pt A&Ox4, resp e/u, and skin warm and dry.

## 2014-07-26 NOTE — H&P (Signed)
[facility] History and Physical  [name] BHA:[id number] DOB: 09-23-1945 DOA: 07/26/2014  Referring physician: ed PCP: Philis Fendt, MD  Specialists: GI  Chief Complaint: n/v  HPI: 50 ?, h/o cirrhosis on [place], COPD on chronic O2, Ty 2 DM c renal manifestation, Grade 1 diastolic dysfunction ECHO 05/2012, CKD stage 2-3, R hemicolectomy 2012 large villous adenoma EGD + Colonoscopy 07/2013 Diverticulosis admitted from home after recent stay at [facility] for [facility]. She states over the past 3-4 days whereas her blood sugars asre usually uin the 80-90 range, her sugars have been as high as 300-500. On 07/18/14 AM she awoke around 8:30 with feelings of nausea. She also had episodes of coughing and proceeded only to eat half a boiled egg she did not really eat much yesterday-with the cough she had episodes of diarrhea-her stool was formed but soft . She has been vomiting mainly liquids she denies any chest pain. She felt like she was warm to touch and to have diarrhea and loose stool every time she had a cough. Her cough and loose stool eventually subsided and she indeed to have nausea and vomiting. She states that when she was at the nursing facility she had someone in the room with her who had similar symptoms of sickness and illness. She states she has generalized abdominal pain and Hemoccult performed here was negative although it appeared reddish to practitioner who saw her. On arrival her sats were in the 88% and she was placed on oxygen 3 L. She rates her pain currently as 9 on 10 despite receiving IV morphine  Labs on admission showed normal lipase 8 albumin 3.3 Lactic acid 2.38 Anion gap 17 WBC 13.6 Hemoglobin 10.9 with baseline in 9-10 INR 1.08 PTT 14.2 Random glucose 356(and on recheck 310     Review of Systems: The patient denied Chest pain Shortness of breath Rash Dysuria Blurred vision or double vision Unilateral weakness Seizure  She has a cough  she has diarrhea she has protracted nausea and vomiting Any other symptoms and all of her organ systems were Reviewed  Past Medical History  Diagnosis Date  . Alcoholic cirrhosis   . Reflux esophagitis   . Gastroesophageal reflux   . Colon polyps   . CHF (congestive heart failure) 10/13    grade 1 diastolic dysfunction, Nl LVF  . Hypertension   . History of GI diverticular bleed   . Pneumonia     history of  . Asthma   . Anemia   . Blood transfusion few years ago  . Villous adenoma of colon 05/10/11  . PVD (peripheral vascular disease) 2009    bilat iliac stenting  . Chronic obstructive pulmonary disease (COPD)   . Arthritis   . Glaucoma     left  . Gout   . Acute venous embolism and thrombosis of deep vessels of proximal lower extremity   . Back pain   . Leg swelling   . Diabetes mellitus     insulin dep   . Hepatitis     alcoholic hepatitis  . Depression   . Anxiety   . On home oxygen therapy oxygen 2 liter per minute prn per [facility]   Past Surgical History  Procedure Laterality Date  . Cataract extraction Right yrs ago  . Colonoscopy    . Laparoscopic assissted total colectomy w/ j-pouch  04/22/11  . Ventral hernia repair  06/13/2012    Procedure: LAPAROSCOPIC VENTRAL HERNIA;  Surgeon: Adin Hector, MD;  Location: [facility] OR;  Service: General;  Laterality: N/A;  Laparoscopic Assisted Ventral Hernia with Mesh  . Abdominal hysterectomy  yrs ago  . Esophagogastroduodenoscopy (egd) with propofol N/A 08/06/2013    Procedure: ESOPHAGOGASTRODUODENOSCOPY (EGD) WITH PROPOFOL;  Surgeon: Lear Ng, MD;  Location: [facility] ENDOSCOPY;  Service: Endoscopy;  Laterality: N/A;  . Colonoscopy with propofol N/A 08/06/2013    Procedure: COLONOSCOPY WITH PROPOFOL;  Surgeon: Lear Ng, MD;  Location: [facility] ENDOSCOPY;  Service: Endoscopy;  Laterality: N/A;   Social History:  History   Social History Narrative    Allergies  Allergen Reactions  . Penicillins Hives  . Lipitor  [Atorvastatin] Swelling    Family History  Problem Relation Age of Onset  . Cancer Brother     heent ca  . Cancer Sister     liver ca    Prior to Admission medications   Medication Sig Start Date End Date Taking? Authorizing Provider  albuterol (PROVENTIL HFA;VENTOLIN HFA) 108 (90 BASE) MCG/ACT inhaler Inhale 2 puffs into the lungs every 6 (six) hours as needed. For shortness of breath   Yes Historical Provider, MD  albuterol (PROVENTIL) (2.5 MG/3ML) 0.083% nebulizer solution Take 2.5 mg by nebulization every 6 (six) hours as needed for wheezing or shortness of breath.   Yes Historical Provider, MD  allopurinol (ZYLOPRIM) 100 MG tablet Take 100 mg by mouth 2 (two) times daily.   Yes Historical Provider, MD  aspirin 81 MG EC tablet Take 81 mg by mouth daily.     Yes Historical Provider, MD  carvedilol (COREG) 6.25 MG tablet Take 1 tablet (6.25 mg total) by mouth 2 (two) times daily with a meal. 06/19/14  Yes Modena Jansky, MD  cilostazol (PLETAL) 100 MG tablet Take 50 mg by mouth 2 (two) times daily before a meal.    Yes Historical Provider, MD  cloNIDine (CATAPRES) 0.1 MG tablet Take 1 tablet (0.1 mg total) by mouth 3 (three) times daily. 06/19/14  Yes Modena Jansky, MD  diclofenac sodium (VOLTAREN) 1 % GEL Apply 1 application topically daily as needed (leg pain). For leg pain   Yes Historical Provider, MD  ergocalciferol (VITAMIN D2) 50000 UNITS capsule Take 50,000 Units by mouth once a week. On Monday   Yes Historical Provider, MD  fluticasone (FLONASE) 50 MCG/ACT nasal spray Place 2 sprays into the nose daily. 50 mcg /act 2 sprays nasal once a day    Yes Historical Provider, MD  folic acid (FOLVITE) 1 MG tablet Take 1 mg by mouth daily.   Yes Historical Provider, MD  Insulin Detemir (LEVEMIR FLEXTOUCH) 100 UNIT/ML Pen Inject 8 Units into the skin daily at 10 pm.   Yes Historical Provider, MD  montelukast (SINGULAIR) 10 MG tablet Take 10 mg by mouth at bedtime.    Yes Historical  Provider, MD  omeprazole (PRILOSEC) 40 MG capsule Take 40 mg by mouth daily.    Yes Historical Provider, MD  OXYGEN-HELIUM IN Inhale into the lungs. 2 liters nightly   Yes Historical Provider, MD  Polyethylene Glycol 400 (BLINK TEARS OP) Place 1 drop into the right eye daily as needed (dryness).    Yes Historical Provider, MD  triamcinolone cream (KENALOG) 0.5 % Apply 1 application topically daily as needed. For legs   Yes Historical Provider, MD   Physical Exam: Filed Vitals:   07/26/14 1327 07/26/14 1330 07/26/14 1400 07/26/14 1530  BP:  188/75 150/89 186/105  Pulse:  60 98 54  Temp:  TempSrc:      Resp:  15 23 10   SpO2: 97% 97% 97% 98%     General:  Alert sleepy but arousable enough to answer questions  Eyes: EOMI NCAT  ENT: Dry mucosa  Neck: Soft supple JVD flat  Cardiovascular: S1-S2 slightly tachycardic low 90 range  Respiratory: Clinically clear no added sound  Abdomen: Distended diffuse tenderness no rebound but tender in the lower quadrant  Skin: No lower extremity edema  Musculoskeletal: Range of motion intact  Psychiatric: Sleepy euthymic  Neurologic: Grossly intact moving all 4 limbs equally  Labs on Admission:  Basic Metabolic Panel:  Recent Labs Lab 07/26/14 1230  NA 138  K 3.7  CL 99  CO2 22  GLUCOSE 356*  BUN 10  CREATININE 1.02  CALCIUM 9.2   Liver Function Tests:  Recent Labs Lab 07/26/14 1230  AST 16  ALT 8  ALKPHOS 91  BILITOT 0.4  PROT 7.0  ALBUMIN 3.3*    Recent Labs Lab 07/26/14 1230  LIPASE 8*   No results for input(s): AMMONIA in the last 168 hours. CBC:  Recent Labs Lab 07/26/14 1230  WBC 13.6*  NEUTROABS 11.5*  HGB 10.9*  HCT 35.0*  MCV 82.2  PLT 314   Cardiac Enzymes: No results for input(s): CKTOTAL, CKMB, CKMBINDEX, TROPONINI in the last 168 hours.  BNP (last 3 results) No results for input(s): PROBNP in the last 8760 hours. CBG: No results for input(s): GLUCAP in the last 168  hours.  Radiological Exams on Admission: Ct Abdomen Pelvis W Contrast  07/26/2014   CLINICAL DATA:  Diffuse abdominal pain since yesterday. Constipation yesterday and diarrhea today.  EXAM: CT ABDOMEN AND PELVIS WITH CONTRAST  TECHNIQUE: Multidetector CT imaging of the abdomen and pelvis was performed using the standard protocol following bolus administration of intravenous contrast.  CONTRAST:  1104mL OMNIPAQUE IOHEXOL 300 MG/ML  SOLN  COMPARISON:  Abdomen radiographs obtained earlier today. Abdomen and pelvis CT dated 01/13/2012.  FINDINGS: Mildly dilated esophagus with a possible small sliding hiatal hernia. Interval repair of the previously demonstrated ventral hernia. There is a small ventral hernia today containing fat at the level of the mid pelvis. Multiple colonic diverticula without evidence of diverticulitis. Surgically absent right colon with a small bowel to colon anastomosis.  Minimal diffuse low density of the liver relative to the spleen. Unremarkable spleen, gallbladder, adrenal glands and urinary bladder. Surgically absent uterus and ovaries. Multiple bilateral renal cysts. Diffusely atrophied pancreas containing coarse calcifications. Mild atelectasis or scarring at both lung bases. Thoracolumbar scoliosis and degenerative changes. Stable 30% T11 vertebral compression deformity the and 10% L2 vertebral compression deformity.  IMPRESSION: 1. No acute abnormality. 2. Mildly dilated esophagus with a possible small sliding hiatal hernia. 3. Interval ventral hernia repair with a small residual or recurrent ventral hernia containing fat. 4. Colonic diverticulosis. 5. Minimal diffuse hepatic steatosis. 6. Stable changes of chronic pancreatitis with diffuse pancreatic atrophy.   Electronically Signed   By: Enrique Sack M.D.   On: 07/26/2014 16:33   Dg Abd Portable 1v  07/26/2014   CLINICAL DATA:  Abdominal pain, nausea/vomiting, history of GI bleed, diverticulitis, and hernia repair  EXAM: PORTABLE  ABDOMEN - 1 VIEW  COMPARISON:  06/18/2014  FINDINGS: Paucity of bowel gas, without disproportionate small bowel dilatation to suggest small bowel obstruction.  Vascular iliac stents.  Degenerative changes of the lumbar spine and bilateral hips. Visualized bony pelvis appears intact.  IMPRESSION: Unremarkable abdominal radiograph.   Electronically Signed  By: Julian Hy M.D.   On: 07/26/2014 14:37   EKG Sinus rhythm PR interval 0.12, QRS axis 45 abnormally rapid R-wave progression   Assessment/Plan Principal Problem:   Abdominal pain, acute, bilateral lower quadrant-differential diagnosis is broad. Given her history of vascular disease status post intervention, I'm most concerned about the degree of her abdominal pain and symptomatology reflecting possible mesenteric ischemia-I will get a CT angiogram of abdomen pelvis to rule out ischemia. Second on my list of concern would be infectious diarrhea for which I will place the patient on clindamycin which should offer good intra-abdominal coverage. She is allergic to penicillins and that is why we held off of Zosyn she should be aggressively hydrated with saline at 125 cc per hour.  Would repeat her lab every 12 hours. Radiologist Dr. Laurence Ferrari  Discussed with me and his read is that Celiac and SMA patent but IMA seems to be a little occluded, but doesn't see any any blockages-he recommends we defer imaging for at least 12 hours. My thoughts are this could just be a viral GE like Norovirus hence ordered GI pathogen    Sepsis-potentially infectious colitis versus secondary to decreased perfusion from mesenteric ischemia.  We will obtain a stool panel. Her lactic acid was 2.38  I will obtain a pro-calcitonin.   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 Her delta ratio is 2.5 representing high anion gap  acidosis with concurrent metabolic alkalosis. I believe she might be in early DKA and this may also be one of the presentations that she has and differentials. As such we will get stat qualitative ketones as well as urine analysis to determine if there ketones present.  She could also have starvation ketosis which would complicate the issue. We will recheck her blood sugar every 2 hours. If there is a concern for DKA, we will start her on DKA protocol    ? Of blood in stool-Hemoccult bneg.  Monitor.     CKD (chronic kidney disease), stage III-stable-Bun/Creat to be followed   Atypical chest pain- no history of CAD. Myoview low risk 2012. Hold Aspirin   PVD, LEIA/LIIA and RCIA pta 7/09- ABIs 0.68 and 0.69 Feb 2014   PUD (peptic ulcer disease) GI bleed in past (2011) and more recently LGIB suspected to be divertuicular complicated by hypovolemic shock requiring ICU care.  Blood pressures very stable currently-monitor.  Hold Cilastozol for right now  Admit to SDU Discussed with both GI and Radiology Full code NO family +  Time spent: 75 min  Verlon Au [facility] Pager [phone]  If 7PM-7AM, please contact night-coverage [email or web address] Password [facility] 07/26/2014, 4:49 PM

## 2014-07-26 NOTE — ED Provider Notes (Signed)
CSN: [id number]     Arrival date & time 07/26/14  1146 History   First MD Initiated Contact with Patient 07/26/14 1205     Chief Complaint  Patient presents with  . Abdominal Pain  . Nausea  . Emesis     (Consider location/radiation/quality/duration/timing/severity/associated sxs/prior Treatment) The history is provided by the patient and a friend. No language interpreter was used.  [name] is a 68 y/o F with PMHx of alcohol-induced cirrhosis, GERD, colonic polyps, congestive heart failure, hypertension, pneumonia, PVD, DVT, diabetes, leg swelling, asthma presenting to the ED with abdominal pain with nausea and vomiting that occurred this morning. As per power of attorney, Caren Griffins who is friend of the patient-reported the patient started to have nausea at approximately 8:30-9:30 AM this morning. Reported that shortly after the nausea and vomiting patient is started to express abdominal pain described as a burning sensation that is generalized. Reported patient has had at least 10 episodes of emesis-patient reported that she is not able to keep any food or fluids down. Friend reported that patient is been having sugars high as 358 this morning. Stated that patient has been unable to keep any of her medications down. Reported that she's been having normal bowel movements without any signs applied. Denied fever, chills, sick contacts, changes to bowel movement, melena, hematochezia, hematuria, chest pain, shortness of breath, difficulty breathing. PCP Dr. Warren Danes GI Dr. Michail Sermon  Past Medical History  Diagnosis Date  . Alcoholic cirrhosis   . Reflux esophagitis   . Gastroesophageal reflux   . Colon polyps   . CHF (congestive heart failure) 10/13    grade 1 diastolic dysfunction, Nl LVF  . Hypertension   . History of GI diverticular bleed   . Pneumonia     history of  . Asthma   . Anemia   . Blood transfusion few years ago  . Villous adenoma of colon 05/10/11  . PVD (peripheral  vascular disease) 2009    bilat iliac stenting  . Chronic obstructive pulmonary disease (COPD)   . Arthritis   . Glaucoma     left  . Gout   . Acute venous embolism and thrombosis of deep vessels of proximal lower extremity   . Back pain   . Leg swelling   . Diabetes mellitus     insulin dep   . Hepatitis     alcoholic hepatitis  . Depression   . Anxiety   . On home oxygen therapy oxygen 2 liter per minute prn per [facility]   Past Surgical History  Procedure Laterality Date  . Cataract extraction Right yrs ago  . Colonoscopy    . Laparoscopic assissted total colectomy w/ j-pouch  04/22/11  . Ventral hernia repair  06/13/2012    Procedure: LAPAROSCOPIC VENTRAL HERNIA;  Surgeon: Adin Hector, MD;  Location: [facility];  Service: General;  Laterality: N/A;  Laparoscopic Assisted Ventral Hernia with Mesh  . Abdominal hysterectomy  yrs ago  . Esophagogastroduodenoscopy (egd) with propofol N/A 08/06/2013    Procedure: ESOPHAGOGASTRODUODENOSCOPY (EGD) WITH PROPOFOL;  Surgeon: Lear Ng, MD;  Location: [facility];  Service: Endoscopy;  Laterality: N/A;  . Colonoscopy with propofol N/A 08/06/2013    Procedure: COLONOSCOPY WITH PROPOFOL;  Surgeon: Lear Ng, MD;  Location: [facility];  Service: Endoscopy;  Laterality: N/A;   Family History  Problem Relation Age of Onset  . Cancer Brother     heent ca  . Cancer Sister     liver  ca   History  Substance Use Topics  . Smoking status: Former Smoker -- 0.50 packs/day for 30 years    Types: Cigarettes    Quit date: 11/06/2005  . Smokeless tobacco: Never Used  . Alcohol Use: No     Comment: stopped 2005   OB History    No data available     Review of Systems  Constitutional: Negative for fever and chills.  Respiratory: Negative for chest tightness and shortness of breath.   Cardiovascular: Negative for chest pain.  Gastrointestinal: Positive for nausea, abdominal pain and constipation. Negative for diarrhea, blood in  stool and anal bleeding.  Genitourinary: Negative for dysuria and hematuria.  Musculoskeletal: Negative for back pain and neck pain.      Allergies  Penicillins and Lipitor  Home Medications   Prior to Admission medications   Medication Sig Start Date End Date Taking? Authorizing Provider  albuterol (PROVENTIL HFA;VENTOLIN HFA) 108 (90 BASE) MCG/ACT inhaler Inhale 2 puffs into the lungs every 6 (six) hours as needed. For shortness of breath   Yes Historical Provider, MD  albuterol (PROVENTIL) (2.5 MG/3ML) 0.083% nebulizer solution Take 2.5 mg by nebulization every 6 (six) hours as needed for wheezing or shortness of breath.   Yes Historical Provider, MD  allopurinol (ZYLOPRIM) 100 MG tablet Take 100 mg by mouth 2 (two) times daily.   Yes Historical Provider, MD  aspirin 81 MG EC tablet Take 81 mg by mouth daily.     Yes Historical Provider, MD  carvedilol (COREG) 6.25 MG tablet Take 1 tablet (6.25 mg total) by mouth 2 (two) times daily with a meal. 06/19/14  Yes Modena Jansky, MD  cilostazol (PLETAL) 100 MG tablet Take 50 mg by mouth 2 (two) times daily before a meal.    Yes Historical Provider, MD  cloNIDine (CATAPRES) 0.1 MG tablet Take 1 tablet (0.1 mg total) by mouth 3 (three) times daily. 06/19/14  Yes Modena Jansky, MD  diclofenac sodium (VOLTAREN) 1 % GEL Apply 1 application topically daily as needed (leg pain). For leg pain   Yes Historical Provider, MD  ergocalciferol (VITAMIN D2) 50000 UNITS capsule Take 50,000 Units by mouth once a week. On Monday   Yes Historical Provider, MD  fluticasone (FLONASE) 50 MCG/ACT nasal spray Place 2 sprays into the nose daily. 50 mcg /act 2 sprays nasal once a day    Yes Historical Provider, MD  folic acid (FOLVITE) 1 MG tablet Take 1 mg by mouth daily.   Yes Historical Provider, MD  Insulin Detemir (LEVEMIR FLEXTOUCH) 100 UNIT/ML Pen Inject 8 Units into the skin daily at 10 pm.   Yes Historical Provider, MD  montelukast (SINGULAIR) 10 MG  tablet Take 10 mg by mouth at bedtime.    Yes Historical Provider, MD  omeprazole (PRILOSEC) 40 MG capsule Take 40 mg by mouth daily.    Yes Historical Provider, MD  OXYGEN-HELIUM IN Inhale into the lungs. 2 liters nightly   Yes Historical Provider, MD  Polyethylene Glycol 400 (BLINK TEARS OP) Place 1 drop into the right eye daily as needed (dryness).    Yes Historical Provider, MD  triamcinolone cream (KENALOG) 0.5 % Apply 1 application topically daily as needed. For legs   Yes Historical Provider, MD   BP 195/94 mmHg  Pulse 84  Temp(Src) 97.6 F (36.4 C) (Oral)  Resp 17  SpO2 96% Physical Exam  Constitutional: She is oriented to person, place, and time. She appears well-developed and well-nourished. No distress.  HENT:  Head: Normocephalic and atraumatic.  Dry mucus membranes  Eyes: Conjunctivae and EOM are normal. Pupils are equal, round, and reactive to light. Right eye exhibits no discharge. Left eye exhibits no discharge.  Neck: Normal range of motion. Neck supple.  Cardiovascular: Normal rate, regular rhythm and normal heart sounds.   Pulmonary/Chest: Effort normal and breath sounds normal. No respiratory distress. She has no wheezes. She has no rales.  Abdominal: She exhibits distension. There is tenderness. There is guarding. There is no rebound.  Abdominal distension noted Decreased bowel sounds in all quadrants of the abdomen  Abdomen hard upon palpation  Tenderness upon palpation to all quadrants of the abdomen - diffuse  Genitourinary:  Rectal Exam: Negative swelling, erythema, inflammation, hemorrhoids noted to the anus. Negative BRBPR. Sphincter tone intact. Negative blood on glove. Brown soft stools on glove.   Musculoskeletal: Normal range of motion.  Neurological: She is alert and oriented to person, place, and time. No cranial nerve deficit. She exhibits normal muscle tone. Coordination normal.  Skin: Skin is warm and dry. No rash noted. She is not diaphoretic. No  erythema.  Psychiatric: She has a normal mood and affect. Her behavior is normal. Thought content normal.  Nursing note and vitals reviewed.   ED Course  Procedures (including critical care time)  Results for orders placed or performed during the hospital encounter of 07/26/14  CBC WITH DIFFERENTIAL  Result Value Ref Range   WBC 13.6 (H) 4.0 - 10.5 K/uL   RBC 4.26 3.87 - 5.11 MIL/uL   Hemoglobin 10.9 (L) 12.0 - 15.0 g/dL   HCT 35.0 (L) 36.0 - 46.0 %   MCV 82.2 78.0 - 100.0 fL   MCH 25.6 (L) 26.0 - 34.0 pg   MCHC 31.1 30.0 - 36.0 g/dL   RDW 15.0 11.5 - 15.5 %   Platelets 314 150 - 400 K/uL   Neutrophils Relative % 84 (H) 43 - 77 %   Neutro Abs 11.5 (H) 1.7 - 7.7 K/uL   Lymphocytes Relative 13 12 - 46 %   Lymphs Abs 1.8 0.7 - 4.0 K/uL   Monocytes Relative 3 3 - 12 %   Monocytes Absolute 0.3 0.1 - 1.0 K/uL   Eosinophils Relative 0 0 - 5 %   Eosinophils Absolute 0.0 0.0 - 0.7 K/uL   Basophils Relative 0 0 - 1 %   Basophils Absolute 0.0 0.0 - 0.1 K/uL  Comprehensive metabolic panel  Result Value Ref Range   Sodium 138 137 - 147 mEq/L   Potassium 3.7 3.7 - 5.3 mEq/L   Chloride 99 96 - 112 mEq/L   CO2 22 19 - 32 mEq/L   Glucose, Bld 356 (H) 70 - 99 mg/dL   BUN 10 6 - 23 mg/dL   Creatinine, Ser 1.02 0.50 - 1.10 mg/dL   Calcium 9.2 8.4 - 10.5 mg/dL   Total Protein 7.0 6.0 - 8.3 g/dL   Albumin 3.3 (L) 3.5 - 5.2 g/dL   AST 16 0 - 37 U/L   ALT 8 0 - 35 U/L   Alkaline Phosphatase 91 39 - 117 U/L   Total Bilirubin 0.4 0.3 - 1.2 mg/dL   GFR calc non Af Amer 55 (L) >90 mL/min   GFR calc Af Amer 64 (L) >90 mL/min   Anion gap 17 (H) 5 - 15  Lipase, blood  Result Value Ref Range   Lipase 8 (L) 11 - 59 U/L  Urinalysis with microscopic  Result Value Ref Range  Color, Urine YELLOW YELLOW   APPearance CLEAR CLEAR   Specific Gravity, Urine 1.023 1.005 - 1.030   pH 6.5 5.0 - 8.0   Glucose, UA >1000 (A) NEGATIVE mg/dL   Hgb urine dipstick SMALL (A) NEGATIVE   Bilirubin Urine  NEGATIVE NEGATIVE   Ketones, ur 15 (A) NEGATIVE mg/dL   Protein, ur 30 (A) NEGATIVE mg/dL   Urobilinogen, UA 0.2 0.0 - 1.0 mg/dL   Nitrite NEGATIVE NEGATIVE   Leukocytes, UA NEGATIVE NEGATIVE  Protime-INR  Result Value Ref Range   Prothrombin Time 14.2 11.6 - 15.2 seconds   INR 1.08 0.00 - 1.49  Ketones, qualitative  Result Value Ref Range   Acetone, Bld SMALL (A) NEGATIVE  Urine microscopic-add on  Result Value Ref Range   RBC / HPF 0-2 <3 RBC/hpf  I-Stat CG4 Lactic Acid, ED  Result Value Ref Range   Lactic Acid, Venous 2.38 (H) 0.5 - 2.2 mmol/L  POC occult blood, ED  Result Value Ref Range   Fecal Occult Bld NEGATIVE NEGATIVE  I-stat troponin, ED  Result Value Ref Range   Troponin i, poc 0.00 0.00 - 0.08 ng/mL   Comment 3          CBG monitoring, ED  Result Value Ref Range   Glucose-Capillary 310 (H) 70 - 99 mg/dL  I-Stat venous blood gas, ED  Result Value Ref Range   pH, Ven 7.329 (H) 7.250 - 7.300   pCO2, Ven 53.3 (H) 45.0 - 50.0 mmHg   pO2, Ven 37.0 30.0 - 45.0 mmHg   Bicarbonate 28.0 (H) 20.0 - 24.0 mEq/L   TCO2 30 0 - 100 mmol/L   O2 Saturation 65.0 %   Acid-Base Excess 1.0 0.0 - 2.0 mmol/L   Sample type VENOUS    Comment NOTIFIED PHYSICIAN   CBG monitoring, ED  Result Value Ref Range   Glucose-Capillary 315 (H) 70 - 99 mg/dL    Labs Review Labs Reviewed  CBC WITH DIFFERENTIAL - Abnormal; Notable for the following:    WBC 13.6 (*)    Hemoglobin 10.9 (*)    HCT 35.0 (*)    MCH 25.6 (*)    Neutrophils Relative % 84 (*)    Neutro Abs 11.5 (*)    All other components within normal limits  COMPREHENSIVE METABOLIC PANEL - Abnormal; Notable for the following:    Glucose, Bld 356 (*)    Albumin 3.3 (*)    GFR calc non Af Amer 55 (*)    GFR calc Af Amer 64 (*)    Anion gap 17 (*)    All other components within normal limits  LIPASE, BLOOD - Abnormal; Notable for the following:    Lipase 8 (*)    All other components within normal limits  URINALYSIS,  ROUTINE W REFLEX MICROSCOPIC - Abnormal; Notable for the following:    Glucose, UA >1000 (*)    Hgb urine dipstick SMALL (*)    Ketones, ur 15 (*)    Protein, ur 30 (*)    All other components within normal limits  KETONES, QUALITATIVE - Abnormal; Notable for the following:    Acetone, Bld SMALL (*)    All other components within normal limits  I-STAT CG4 LACTIC ACID, ED - Abnormal; Notable for the following:    Lactic Acid, Venous 2.38 (*)    All other components within normal limits  CBG MONITORING, ED - Abnormal; Notable for the following:    Glucose-Capillary 310 (*)    All  other components within normal limits  I-STAT VENOUS BLOOD GAS, ED - Abnormal; Notable for the following:    pH, Ven 7.329 (*)    pCO2, Ven 53.3 (*)    Bicarbonate 28.0 (*)    All other components within normal limits  CBG MONITORING, ED - Abnormal; Notable for the following:    Glucose-Capillary 315 (*)    All other components within normal limits  PROTIME-INR  URINE MICROSCOPIC-ADD ON  OCCULT BLOOD X 1 CARD TO LAB, STOOL  BLOOD GAS, VENOUS  GI PATHOGEN PANEL BY PCR, STOOL  PROCALCITONIN  PROCALCITONIN  BASIC METABOLIC PANEL  BASIC METABOLIC PANEL  CBC WITH DIFFERENTIAL  CBC WITH DIFFERENTIAL  BASIC METABOLIC PANEL  BASIC METABOLIC PANEL  POC OCCULT BLOOD, ED  I-STAT TROPOININ, ED    Imaging Review Ct Abdomen Pelvis W Contrast  07/26/2014   CLINICAL DATA:  Diffuse abdominal pain since yesterday. Constipation yesterday and diarrhea today.  EXAM: CT ABDOMEN AND PELVIS WITH CONTRAST  TECHNIQUE: Multidetector CT imaging of the abdomen and pelvis was performed using the standard protocol following bolus administration of intravenous contrast.  CONTRAST:  141mL OMNIPAQUE IOHEXOL 300 MG/ML  SOLN  COMPARISON:  Abdomen radiographs obtained earlier today. Abdomen and pelvis CT dated 01/13/2012.  FINDINGS: Mildly dilated esophagus with a possible small sliding hiatal hernia. Interval repair of the previously  demonstrated ventral hernia. There is a small ventral hernia today containing fat at the level of the mid pelvis. Multiple colonic diverticula without evidence of diverticulitis. Surgically absent right colon with a small bowel to colon anastomosis.  Minimal diffuse low density of the liver relative to the spleen. Unremarkable spleen, gallbladder, adrenal glands and urinary bladder. Surgically absent uterus and ovaries. Multiple bilateral renal cysts. Diffusely atrophied pancreas containing coarse calcifications. Mild atelectasis or scarring at both lung bases. Thoracolumbar scoliosis and degenerative changes. Stable 30% T11 vertebral compression deformity the and 10% L2 vertebral compression deformity.  IMPRESSION: 1. No acute abnormality. 2. Mildly dilated esophagus with a possible small sliding hiatal hernia. 3. Interval ventral hernia repair with a small residual or recurrent ventral hernia containing fat. 4. Colonic diverticulosis. 5. Minimal diffuse hepatic steatosis. 6. Stable changes of chronic pancreatitis with diffuse pancreatic atrophy.   Electronically Signed   By: Enrique Sack M.D.   On: 07/26/2014 16:33   Dg Abd Portable 1v  07/26/2014   CLINICAL DATA:  Abdominal pain, nausea/vomiting, history of GI bleed, diverticulitis, and hernia repair  EXAM: PORTABLE ABDOMEN - 1 VIEW  COMPARISON:  06/18/2014  FINDINGS: Paucity of bowel gas, without disproportionate small bowel dilatation to suggest small bowel obstruction.  Vascular iliac stents.  Degenerative changes of the lumbar spine and bilateral hips. Visualized bony pelvis appears intact.  IMPRESSION: Unremarkable abdominal radiograph.   Electronically Signed   By: Julian Hy M.D.   On: 07/26/2014 14:37     EKG Interpretation   Date/Time:  Saturday July 26 2014 16:32:25 EST Ventricular Rate:  80 PR Interval:  159 QRS Duration: 94 QT Interval:  450 QTC Calculation: 519 R Axis:   59 Text Interpretation:  Sinus arrhythmia Probable  left atrial enlargement  Abnormal R-wave progression, early transition Prolonged QT interval agree.  no stemi.  Confirmed by Johnney Killian, MD, Jeannie Done [phone]) on 07/26/2014 4:44:37  PM       1:27 PM Attending physician, Dr. Jerilynn Mages. Pfeiffer at bedside.   4:53 PM This provider spoke with Dr. Verlon Au - [facility]. Discussed case in great detail, labs, imaging, ED course, vitals,  recent admission. Patient to be seen by Dr. Verlon Au.   MDM   Final diagnoses:  Abdominal pain    Medications  iohexol (OMNIPAQUE) 300 MG/ML solution 25 mL (25 mLs Oral Contrast Given 07/26/14 1353)  pantoprazole (PROTONIX) EC tablet 40 mg (not administered)  montelukast (SINGULAIR) tablet 10 mg (not administered)  cilostazol (PLETAL) tablet 50 mg (not administered)  albuterol (PROVENTIL) (2.5 MG/3ML) 0.083% nebulizer solution 2.5 mg (not administered)  metoprolol (LOPRESSOR) injection 5 mg (not administered)  metoprolol (LOPRESSOR) injection 5 mg (5 mg Intravenous Given 07/26/14 1743)  clindamycin (CLEOCIN) IVPB 600 mg (600 mg Intravenous New Bag/Given 07/26/14 1743)  0.9 %  sodium chloride infusion (not administered)  sodium chloride 0.9 % injection 3 mL (not administered)  hydrALAZINE (APRESOLINE) injection 20 mg (20 mg Intravenous Given 07/26/14 1812)  0.9 %  sodium chloride infusion ( Intravenous New Bag/Given 07/26/14 1818)  dextrose 5 %-0.45 % sodium chloride infusion (not administered)  insulin regular (NOVOLIN R,HUMULIN R) 250 Units in sodium chloride 0.9 % 250 mL (1 Units/mL) infusion (not administered)  potassium chloride 10 mEq in 100 mL IVPB (not administered)  morphine 4 MG/ML injection 4 mg (4 mg Intravenous Given 07/26/14 1818)  sodium chloride 0.9 % bolus 1,000 mL (0 mLs Intravenous Stopped 07/26/14 1743)  morphine 2 MG/ML injection 2 mg (2 mg Intravenous Given 07/26/14 1351)  ondansetron (ZOFRAN) injection 4 mg (4 mg Intravenous Given 07/26/14 1338)  iohexol (OMNIPAQUE) 300 MG/ML solution 100  mL (100 mLs Intravenous Contrast Given 07/26/14 1557)  morphine 2 MG/ML injection 2 mg (2 mg Intravenous Given 07/26/14 1639)  sodium chloride 0.9 % bolus 500 mL (500 mLs Intravenous New Bag/Given 07/26/14 1647)  ondansetron (ZOFRAN) injection 4 mg (4 mg Intravenous Given 07/26/14 1647)  insulin aspart (novoLOG) injection 10 Units (10 Units Subcutaneous Given 07/26/14 1710)    Filed Vitals:   07/26/14 1530 07/26/14 1700 07/26/14 1800 07/26/14 1812  BP: 186/105 192/105 195/94 195/94  Pulse: 54 96 84   Temp:      TempSrc:      Resp: 10 26 17    SpO2: 98% 93% 96%    This provider reviewed the patient's chart. Patient was seen and assessed in ED setting on 06/14/2014 for a hemorrhagic GI bleed. Patient was admitted to floor where she had an episode where she coded-patient was admitted to ICU. Patient was intubated on 06/15/2014. Patient recently released from rehabilitation on Tuesday, 07/22/2014. EKG noted sinus arrhythmia with left atrial enlargement-heart rate 80 bpm. I-STAT troponin negative elevation. Negative elevated troponin. Elevated leukocytosis with a white blood cell count of 13.6. Hemoglobin 10.9, hematocrit 35.0. Lactic acid elevated at 2.38. Lipase negative elevation-8. PT/INR within normal limits. Repeat CBG after fluids identified glucose of 310-after 1 L. Ketones small amount. Urinalysis noted small hemoglobin with negative nitrites or leukocytes-ketones 15. VBG noted mild acidity with a pH of 7.329. Bicarbonate elevated after fluids administered 28.0. Fecal occult negative. Portable abdominal x-ray unremarkable findings - negative signs of dilatation or free air.  Doubt small bowel obstruction. Doubt acute abdominal processes according to CT abdomen and pelvis with contrast. Doubt acute pancreatitis. Doubt acute appendicitis. Patient presenting to the ED with glucose of 356-hyperglycemia with mild elevated anion gap of 17.0 mg/L. Suspicion to be possible gastroparesis secondary to  early beginnings of DKA. IV fluids, pain medications and IV anti-nausea medications administered in ED setting with minimal relief of nausea and vomiting. Nausea and emesis intractable. Discussed case in great detail with Dr. Verlon Au, [facility]. Patient to be admitted to stepdown unit for DKA with possible beginnings of gastroparesis, intractable nausea and emesis, abdominal pain. Vital have been stable while in ED setting-patient is chronically on oxygen therapy at home via nasal cannula. Mildly elevated blood pressure intermittently throughout stay. Patient does not appear septic. Patient started on glucose stabilizer in ED setting. Patient stable for transfer.   Jamse Mead, PA-C 07/26/14 1839  Charlesetta Shanks, MD 07/29/14 [phone]

## 2014-07-26 NOTE — ED Notes (Addendum)
IV started by EMS would not flush.  Redressed and repositioned with good flow.  Stopped again after pt moved arm.  Unable to flush or correct a second time by this RN or other RN.  Second RN attempted IV x 2.  Dr Colin Rhein at bedside to attempt IV access with [place]. Phlebotomy returned to bedside to assist.

## 2014-07-27 ENCOUNTER — Encounter (HOSPITAL_COMMUNITY): Payer: Self-pay | Admitting: *Deleted

## 2014-07-27 LAB — BASIC METABOLIC PANEL
ANION GAP: 15 (ref 5–15)
ANION GAP: 13 (ref 5–15)
ANION GAP: 15 (ref 5–15)
ANION GAP: 14 (ref 5–15)
Anion gap: 13 (ref 5–15)
BUN: 10 mg/dL (ref 6–23)
BUN: 10 mg/dL (ref 6–23)
BUN: 10 mg/dL (ref 6–23)
BUN: 10 mg/dL (ref 6–23)
BUN: 10 mg/dL (ref 6–23)
CALCIUM: 9 mg/dL (ref 8.4–10.5)
CALCIUM: 8.8 mg/dL (ref 8.4–10.5)
CALCIUM: 8.5 mg/dL (ref 8.4–10.5)
CHLORIDE: 103 meq/L (ref 96–112)
CHLORIDE: 104 meq/L (ref 96–112)
CO2: 24 mEq/L (ref 19–32)
CO2: 26 meq/L (ref 19–32)
CO2: 24 mEq/L (ref 19–32)
CO2: 24 meq/L (ref 19–32)
CO2: 23 mEq/L (ref 19–32)
CREATININE: 1.07 mg/dL (ref 0.50–1.10)
CREATININE: 1.13 mg/dL — AB (ref 0.50–1.10)
Calcium: 8.9 mg/dL (ref 8.4–10.5)
Calcium: 8.4 mg/dL (ref 8.4–10.5)
Chloride: 100 mEq/L (ref 96–112)
Chloride: 101 mEq/L (ref 96–112)
Chloride: 99 mEq/L (ref 96–112)
Creatinine, Ser: 1 mg/dL (ref 0.50–1.10)
Creatinine, Ser: 1.07 mg/dL (ref 0.50–1.10)
Creatinine, Ser: 1.17 mg/dL — ABNORMAL HIGH (ref 0.50–1.10)
GFR calc Af Amer: 65 mL/min — ABNORMAL LOW (ref >90)
GFR calc Af Amer: 60 mL/min — ABNORMAL LOW (ref >90)
GFR calc Af Amer: 60 mL/min — ABNORMAL LOW (ref >90)
GFR calc Af Amer: 56 mL/min — ABNORMAL LOW (ref >90)
GFR calc Af Amer: 54 mL/min — ABNORMAL LOW (ref >90)
GFR calc non Af Amer: 56 mL/min — ABNORMAL LOW (ref >90)
GFR calc non Af Amer: 52 mL/min — ABNORMAL LOW (ref >90)
GFR calc non Af Amer: 52 mL/min — ABNORMAL LOW (ref >90)
GFR calc non Af Amer: 48 mL/min — ABNORMAL LOW (ref >90)
GFR, EST NON AFRICAN AMERICAN: 46 mL/min — AB (ref >90)
GLUCOSE: 65 mg/dL — AB (ref 70–99)
Glucose, Bld: 180 mg/dL — ABNORMAL HIGH (ref 70–99)
Glucose, Bld: 151 mg/dL — ABNORMAL HIGH (ref 70–99)
Glucose, Bld: 146 mg/dL — ABNORMAL HIGH (ref 70–99)
Glucose, Bld: 356 mg/dL — ABNORMAL HIGH (ref 70–99)
POTASSIUM: 3.7 meq/L (ref 3.7–5.3)
Potassium: 4.1 mEq/L (ref 3.7–5.3)
Potassium: 3.6 mEq/L — ABNORMAL LOW (ref 3.7–5.3)
Potassium: 3.5 mEq/L — ABNORMAL LOW (ref 3.7–5.3)
Potassium: 4.4 mEq/L (ref 3.7–5.3)
SODIUM: 136 meq/L — AB (ref 137–147)
Sodium: 142 mEq/L (ref 137–147)
Sodium: 143 mEq/L (ref 137–147)
Sodium: 139 mEq/L (ref 137–147)
Sodium: 138 mEq/L (ref 137–147)

## 2014-07-27 LAB — GLUCOSE, CAPILLARY
GLUCOSE-CAPILLARY: 191 mg/dL — AB (ref 70–99)
GLUCOSE-CAPILLARY: 130 mg/dL — AB (ref 70–99)
GLUCOSE-CAPILLARY: 120 mg/dL — AB (ref 70–99)
GLUCOSE-CAPILLARY: 101 mg/dL — AB (ref 70–99)
GLUCOSE-CAPILLARY: 92 mg/dL (ref 70–99)
GLUCOSE-CAPILLARY: 96 mg/dL (ref 70–99)
Glucose-Capillary: 100 mg/dL — ABNORMAL HIGH (ref 70–99)
Glucose-Capillary: 166 mg/dL — ABNORMAL HIGH (ref 70–99)
Glucose-Capillary: 176 mg/dL — ABNORMAL HIGH (ref 70–99)
Glucose-Capillary: 187 mg/dL — ABNORMAL HIGH (ref 70–99)
Glucose-Capillary: 201 mg/dL — ABNORMAL HIGH (ref 70–99)
Glucose-Capillary: 203 mg/dL — ABNORMAL HIGH (ref 70–99)
Glucose-Capillary: 250 mg/dL — ABNORMAL HIGH (ref 70–99)
Glucose-Capillary: 63 mg/dL — ABNORMAL LOW (ref 70–99)
Glucose-Capillary: 70 mg/dL (ref 70–99)
Glucose-Capillary: 71 mg/dL (ref 70–99)
Glucose-Capillary: 77 mg/dL (ref 70–99)
Glucose-Capillary: 87 mg/dL (ref 70–99)
Glucose-Capillary: 92 mg/dL (ref 70–99)
Glucose-Capillary: 95 mg/dL (ref 70–99)

## 2014-07-27 LAB — CBC WITH DIFFERENTIAL/PLATELET
BASOS ABS: 0 10*3/uL (ref 0.0–0.1)
BASOS ABS: 0 10*3/uL (ref 0.0–0.1)
Basophils Relative: 0 % (ref 0–1)
Basophils Relative: 0 % (ref 0–1)
EOS PCT: 0 % (ref 0–5)
Eosinophils Absolute: 0 10*3/uL (ref 0.0–0.7)
Eosinophils Absolute: 0 10*3/uL (ref 0.0–0.7)
Eosinophils Relative: 0 % (ref 0–5)
HCT: 38 % (ref 36.0–46.0)
HCT: 36.4 % (ref 36.0–46.0)
Hemoglobin: 11.9 g/dL — ABNORMAL LOW (ref 12.0–15.0)
Hemoglobin: 11.1 g/dL — ABNORMAL LOW (ref 12.0–15.0)
LYMPHS ABS: 2.3 10*3/uL (ref 0.7–4.0)
LYMPHS PCT: 15 % (ref 12–46)
LYMPHS PCT: 16 % (ref 12–46)
Lymphs Abs: 2.5 10*3/uL (ref 0.7–4.0)
MCH: 25.4 pg — ABNORMAL LOW (ref 26.0–34.0)
MCH: 24.8 pg — ABNORMAL LOW (ref 26.0–34.0)
MCHC: 31.3 g/dL (ref 30.0–36.0)
MCHC: 30.5 g/dL (ref 30.0–36.0)
MCV: 81 fL (ref 78.0–100.0)
MCV: 81.4 fL (ref 78.0–100.0)
Monocytes Absolute: 0.9 10*3/uL (ref 0.1–1.0)
Monocytes Absolute: 0.7 10*3/uL (ref 0.1–1.0)
Monocytes Relative: 6 % (ref 3–12)
Monocytes Relative: 5 % (ref 3–12)
NEUTROS ABS: 11.9 10*3/uL — AB (ref 1.7–7.7)
Neutro Abs: 12.3 10*3/uL — ABNORMAL HIGH (ref 1.7–7.7)
Neutrophils Relative %: 79 % — ABNORMAL HIGH (ref 43–77)
Neutrophils Relative %: 79 % — ABNORMAL HIGH (ref 43–77)
PLATELETS: 373 10*3/uL (ref 150–400)
PLATELETS: 414 10*3/uL — AB (ref 150–400)
RBC: 4.69 MIL/uL (ref 3.87–5.11)
RBC: 4.47 MIL/uL (ref 3.87–5.11)
RDW: 15.3 % (ref 11.5–15.5)
RDW: 15.4 % (ref 11.5–15.5)
WBC: 15.5 10*3/uL — AB (ref 4.0–10.5)
WBC: 15.1 10*3/uL — AB (ref 4.0–10.5)

## 2014-07-27 LAB — PROCALCITONIN: Procalcitonin: <0.1 ng/mL

## 2014-07-27 MED ORDER — DEXTROSE 50 % IV SOLN
INTRAVENOUS | Status: AC
  Filled 2014-07-27: qty 50

## 2014-07-27 MED ORDER — CLONIDINE HCL 0.3 MG/24HR TD PTWK
0.3000 mg | MEDICATED_PATCH | TRANSDERMAL | Status: DC
  Administered 2014-07-27: 0.3 mg via TRANSDERMAL
  Filled 2014-07-27: qty 1

## 2014-07-27 MED ORDER — PROMETHAZINE HCL 25 MG/ML IJ SOLN
12.5000 mg | Freq: Four times a day (QID) | INTRAMUSCULAR | Status: DC | PRN
  Administered 2014-07-27: 12.5 mg via INTRAVENOUS
  Filled 2014-07-27: qty 1

## 2014-07-27 MED ORDER — INSULIN ASPART 100 UNIT/ML ~~LOC~~ SOLN: 5.0000 [IU] | Freq: Once | SUBCUTANEOUS | Status: DC

## 2014-07-27 MED ORDER — SODIUM CHLORIDE 0.9 % IJ SOLN
10.0000 mL | INTRAMUSCULAR | Status: DC | PRN
  Administered 2014-07-29 (×3): 10 mL
  Filled 2014-07-27 (×3): qty 40

## 2014-07-27 MED ORDER — DEXTROSE 50 % IV SOLN
12.0000 mL | Freq: Once | INTRAVENOUS | Status: AC
  Administered 2014-07-27: 12 mL via INTRAVENOUS

## 2014-07-27 MED ORDER — CETYLPYRIDINIUM CHLORIDE 0.05 % MT LIQD
7.0000 mL | Freq: Two times a day (BID) | OROMUCOSAL | Status: DC
  Administered 2014-07-28 – 2014-07-30 (×5): 7 mL via OROMUCOSAL

## 2014-07-27 MED ORDER — INSULIN ASPART 100 UNIT/ML ~~LOC~~ SOLN
0.0000 [IU] | Freq: Three times a day (TID) | SUBCUTANEOUS | Status: DC
  Administered 2014-07-28: 2 [IU] via SUBCUTANEOUS

## 2014-07-27 MED ORDER — ONDANSETRON HCL 4 MG/2ML IJ SOLN: 4.0000 mg | Freq: Four times a day (QID) | INTRAMUSCULAR | Status: DC | PRN

## 2014-07-27 MED ORDER — SODIUM CHLORIDE 0.9 % IJ SOLN
10.0000 mL | Freq: Two times a day (BID) | INTRAMUSCULAR | Status: DC
  Administered 2014-07-27: 10 mL
  Administered 2014-07-27: 20 mL
  Administered 2014-07-28 – 2014-07-30 (×3): 10 mL

## 2014-07-27 NOTE — Progress Notes (Signed)
07-27-14  1320   IV Team Note; called to restart 2 ivs, STAT; pt with extremely poor access; has been stuck multiple times per her healthcare POA;  She is requesting a central line or picc;  RN aware and will call MD;  No attempts made at this time but assessed by 2 iv team RNs;    Raynelle Fanning RN IV Team

## 2014-07-27 NOTE — Progress Notes (Signed)
Potassium Chloride 64mEq x 2 doses ordered for patient with K+3.1. First dose was hung as 2035, ordered expired before 2nd dose was given. Reorder was done on original order to give patient her 2nd dose of 84mEq. Order was completed at 0000. Patient with PIV so potassium is running slowly.

## 2014-07-27 NOTE — Progress Notes (Signed)
Peripherally Inserted Central Catheter/Midline Placement  The IV Nurse has discussed with the patient and/or persons authorized to consent for the patient, the purpose of this procedure and the potential benefits and risks involved with this procedure.  The benefits include less needle sticks, lab draws from the catheter and patient may be discharged home with the catheter.  Risks include, but not limited to, infection, bleeding, blood clot (thrombus formation), and puncture of an artery; nerve damage and irregular heat beat.  Alternatives to this procedure were also discussed.  PICC/Midline Placement Documentation  PICC / Midline Double Lumen 07/27/14 PICC Right Brachial 42 cm 0 cm (Active)  Indication for Insertion or Continuance of Line Poor Vasculature-patient has had multiple peripheral attempts or PIVs lasting less than 24 hours;Limited venous access - need for IV therapy >5 days (PICC only) 07/27/2014  3:40 PM  Exposed Catheter (cm) 0 cm 07/27/2014  3:40 PM  Site Assessment Clean;Dry;Intact 07/27/2014  3:40 PM  Lumen #1 Status Flushed;Saline locked;Blood return noted 07/27/2014  3:40 PM  Lumen #2 Status Flushed;Saline locked;Blood return noted 07/27/2014  3:40 PM  Dressing Type Transparent 07/27/2014  3:40 PM  Dressing Status Clean;Dry;Intact;Antimicrobial disc in place 07/27/2014  3:40 PM  Line Care Connections checked and tightened 07/27/2014  3:40 PM  Line Adjustment (NICU/IV Team Only) No 07/27/2014  3:40 PM  Dressing Intervention New dressing 07/27/2014  3:40 PM  Dressing Change Due 08/03/14 07/27/2014  3:40 PM       Rolena Infante 07/27/2014, 3:41 PM

## 2014-07-27 NOTE — Progress Notes (Signed)
Patient arrived from ED. No one notified RN of patients arrival, charge nurse made aware, patient lying in bed with covers disarranged. Insulin gtt hanging on pole not connected to patient. Patient alert and oriented, very cooperative. Patient oriented to room and equipment. Friend in to see patient. Orders released. Will proceed with monitoring and treating patient as prescribed.

## 2014-07-27 NOTE — Progress Notes (Signed)
[name] EQA:[id number] DOB: [birth year] DOA: 07/26/2014 PCP: Philis Fendt, MD  Brief narrative: 68 year old female cirrhosis, COPD on chronic O2, type 2 diabetes mellitus with renal manifestations, grade 1 diastolic dysfunction 97/9892 CK due to 23, right hemicolectomy 2012 re -admitted 07/27/14 with nausea vomiting diarrhea in setting of recent large GI bleed with hypovolemic shock Found to have multiple metabolic abnormalities inclusive of anion gap 17 and ketones positive and admitted for viral gastroenteritis with precipitated DKA  Past medical history-As per Problem list Chart reviewed as below- Reviewed  Consultants: None  Procedures: None  Antibiotics: Clindamycin  Subjective   Feels well but looks tired. Slept the whole night No abdominal pain at present Nursing reports no other issues No diarrhea still has some slight nausea no vomiting no chest pain   Objective    Interim History:   Telemetry: Sinus rhythm    Objective: Filed Vitals:   07/27/14 0400 07/27/14 0700 07/27/14 0800 07/27/14 0804  BP: 156/68 168/75 187/80 187/80  Pulse: 83 77 71 65  Temp:    97.4 F (36.3 C)  TempSrc:    Oral  Resp: 17 20 20 23   Height:      Weight:      SpO2: 97% 97% 96% 98%    Intake/Output Summary (Last 24 hours) at 07/27/14 0843 Last data filed at 07/26/14 2300  Gross per 24 hour  Intake    225 ml  Output      0 ml  Net    225 ml    Exam:  General: Alert pleasant oriented no apparent distress  Cardiovascular: S1-S2 no murmur rub or gallop  Respiratory: Clinically clear no added sound  Abdomen: Soft nontender nondistended no rebound or guarding Skin no lower extremity edema Neuro range of motion intact  Data Reviewed: Basic Metabolic Panel:  Recent Labs Lab 07/26/14 1230 07/26/14 1847 07/26/14 2247 07/27/14 0224 07/27/14 0720  NA 138 140 141 142 143  K 3.7 3.1* 3.7 4.1 3.6*  CL 99 102 102 103 104  CO2 22 22 22 24 26   GLUCOSE 356* 298*  204* 180* 65*  BUN 10 9 9 10 10   CREATININE 1.02 0.92 0.91 1.00 1.07  CALCIUM 9.2 8.6 8.9 8.9 9.0   Liver Function Tests:  Recent Labs Lab 07/26/14 1230  AST 16  ALT 8  ALKPHOS 91  BILITOT 0.4  PROT 7.0  ALBUMIN 3.3*    Recent Labs Lab 07/26/14 1230  LIPASE 8*   No results for input(s): AMMONIA in the last 168 hours. CBC:  Recent Labs Lab 07/26/14 1230 07/26/14 1847 07/27/14 0720  WBC 13.6* 13.5* 15.5*  NEUTROABS 11.5* 12.1* 12.3*  HGB 10.9* 10.5* 11.9*  HCT 35.0* 33.8* 38.0  MCV 82.2 80.5 81.0  PLT 314 334 373   Cardiac Enzymes: No results for input(s): CKTOTAL, CKMB, CKMBINDEX, TROPONINI in the last 168 hours. BNP: Invalid input(s): POCBNP CBG:  Recent Labs Lab 07/27/14 0517 07/27/14 0613 07/27/14 0733 07/27/14 0755 07/27/14 0813  GLUCAP 95 71 63* 70 120*    Recent Results (from the past 240 hour(s))  MRSA PCR Screening     Status: None   Collection Time: 07/26/14  9:36 PM  Result Value Ref Range Status   MRSA by PCR NEGATIVE NEGATIVE Final    Comment:        The GeneXpert MRSA Assay (FDA approved for NASAL specimens only), is one component of a comprehensive MRSA colonization surveillance program. It is not intended to diagnose  MRSA infection nor to guide or monitor treatment for MRSA infections.      Studies:              All Imaging reviewed and is as per above notation   Scheduled Meds: . cilostazol  50 mg Oral BID AC  . clindamycin (CLEOCIN) IV  600 mg Intravenous Q8H  . dextrose      . metoprolol  5 mg Intravenous 4 times per day  . montelukast  10 mg Oral QHS  . pantoprazole  40 mg Oral Daily  . sodium chloride  3 mL Intravenous Q12H   Continuous Infusions: . sodium chloride    . sodium chloride 125 mL/hr at 07/26/14 1818  . dextrose 5 % and 0.45% NaCl 75 mL/hr at 07/26/14 2035  . insulin (NOVOLIN-R) infusion       Assessment/Plan: 1. DKA, initial anion gap 17, currently 13. Repeat one more basic metabolic panel and  then transition off of insulin stabilizer. Precipitating issue probably viral gastroenteritis. 2. COPD-currently stable at present time. 3. Metabolic acidosis-likely secondary to DKA. Continue IV saline 4. Early mild sepsis secondary to Viral gastroenteritis-initial lactic acid 2.38 , pro-calcitonin less than 1.0 to 4 Will consider tapering off antibiotic-For now continue clindamycin. White count is elevated but this may be stress response from diarrhea stateawait PCR. Potentially noravirus. Patient was making some improvement.  5. CK D stage III-renal function within normal limits. Replace potassium if falls below 3.2. 6. Atypical chest pain-no chest pain right now had a low risk Myoview 2012. Hold aspirin for now 7. Peripheral vascular disease 2009 -outpatient follow-up 8. Peptic ulcer disease with GI bleed in 2011 and more recent lower GI bleed 2015-blood pressure stable. Hold aspirin cilostazol for now.   Code Status: Full Family Communication: No family at bedside Disposition Plan: Likely transition to telemetry in the next 24 hours and potentially home soon if able to tolerate by mouth.   [name], MD  [facility] Pager [phone] 07/27/2014, 8:43 AM    LOS: 1 day

## 2014-07-27 NOTE — Progress Notes (Signed)
Blood glucose reading of 63, patients insulin gtt off as directed and 2ml of Dextrose 50% given IV push. Will recheck blood sugar in 15 minutes. Patient awake alert and communicating. Will follow directions of glucose stablizer.

## 2014-07-27 NOTE — Progress Notes (Signed)
Pt's HR increased into the mid 140s. Appeared to be SVT.  Gave scheduled lopressor 5mg  IV early.  Brought HR right down into the 110s.  Will continue to monitor closely and update as needed; pt resting.

## 2014-07-27 NOTE — Progress Notes (Signed)
Pt's IV infiltrated, (second one for today), IV STAT page order put in.  Pt c/o n/v and abdominal pain.  Unable to give IV zofran at this time, will when IV obtained. MD aware. Will continue to monitor closely and update as needed.

## 2014-07-28 LAB — CBC WITH DIFFERENTIAL/PLATELET
BASOS ABS: 0 10*3/uL (ref 0.0–0.1)
BASOS ABS: 0 10*3/uL (ref 0.0–0.1)
Basophils Relative: 0 % (ref 0–1)
Basophils Relative: 0 % (ref 0–1)
Eosinophils Absolute: 0 10*3/uL (ref 0.0–0.7)
Eosinophils Absolute: 0.1 10*3/uL (ref 0.0–0.7)
Eosinophils Relative: 0 % (ref 0–5)
Eosinophils Relative: 1 % (ref 0–5)
HCT: 35 % — ABNORMAL LOW (ref 36.0–46.0)
HCT: 33.2 % — ABNORMAL LOW (ref 36.0–46.0)
HEMOGLOBIN: 10 g/dL — AB (ref 12.0–15.0)
Hemoglobin: 11 g/dL — ABNORMAL LOW (ref 12.0–15.0)
LYMPHS PCT: 14 % (ref 12–46)
Lymphocytes Relative: 21 % (ref 12–46)
Lymphs Abs: 1.9 10*3/uL (ref 0.7–4.0)
Lymphs Abs: 2.4 10*3/uL (ref 0.7–4.0)
MCH: 25.9 pg — ABNORMAL LOW (ref 26.0–34.0)
MCH: 24.7 pg — ABNORMAL LOW (ref 26.0–34.0)
MCHC: 31.4 g/dL (ref 30.0–36.0)
MCHC: 30.1 g/dL (ref 30.0–36.0)
MCV: 82.4 fL (ref 78.0–100.0)
MCV: 82 fL (ref 78.0–100.0)
MONOS PCT: 9 % (ref 3–12)
MONOS PCT: 7 % (ref 3–12)
Monocytes Absolute: 1.2 10*3/uL — ABNORMAL HIGH (ref 0.1–1.0)
Monocytes Absolute: 0.8 10*3/uL (ref 0.1–1.0)
NEUTROS ABS: 10.4 10*3/uL — AB (ref 1.7–7.7)
NEUTROS ABS: 8.1 10*3/uL — AB (ref 1.7–7.7)
NEUTROS PCT: 77 % (ref 43–77)
NEUTROS PCT: 71 % (ref 43–77)
PLATELETS: 367 10*3/uL (ref 150–400)
Platelets: 331 10*3/uL (ref 150–400)
RBC: 4.25 MIL/uL (ref 3.87–5.11)
RBC: 4.05 MIL/uL (ref 3.87–5.11)
RDW: 15.7 % — AB (ref 11.5–15.5)
RDW: 15.6 % — ABNORMAL HIGH (ref 11.5–15.5)
WBC: 13.5 10*3/uL — AB (ref 4.0–10.5)
WBC: 11.3 10*3/uL — ABNORMAL HIGH (ref 4.0–10.5)

## 2014-07-28 LAB — BASIC METABOLIC PANEL
Anion gap: 13 (ref 5–15)
BUN: 11 mg/dL (ref 6–23)
CHLORIDE: 101 meq/L (ref 96–112)
CO2: 25 mEq/L (ref 19–32)
Calcium: 8.4 mg/dL (ref 8.4–10.5)
Creatinine, Ser: 1.27 mg/dL — ABNORMAL HIGH (ref 0.50–1.10)
GFR calc non Af Amer: 42 mL/min — ABNORMAL LOW (ref >90)
GFR, EST AFRICAN AMERICAN: 48 mL/min — AB (ref >90)
Glucose, Bld: 256 mg/dL — ABNORMAL HIGH (ref 70–99)
POTASSIUM: 3.5 meq/L — AB (ref 3.7–5.3)
SODIUM: 139 meq/L (ref 137–147)

## 2014-07-28 LAB — GLUCOSE, CAPILLARY
GLUCOSE-CAPILLARY: 160 mg/dL — AB (ref 70–99)
GLUCOSE-CAPILLARY: 283 mg/dL — AB (ref 70–99)
Glucose-Capillary: 279 mg/dL — ABNORMAL HIGH (ref 70–99)
Glucose-Capillary: 108 mg/dL — ABNORMAL HIGH (ref 70–99)
Glucose-Capillary: 123 mg/dL — ABNORMAL HIGH (ref 70–99)

## 2014-07-28 LAB — PROCALCITONIN: Procalcitonin: <0.1 ng/mL

## 2014-07-28 MED ORDER — INSULIN ASPART 100 UNIT/ML ~~LOC~~ SOLN
3.0000 [IU] | Freq: Three times a day (TID) | SUBCUTANEOUS | Status: DC
  Administered 2014-07-29 – 2014-07-30 (×5): 3 [IU] via SUBCUTANEOUS

## 2014-07-28 MED ORDER — INSULIN ASPART 100 UNIT/ML ~~LOC~~ SOLN
0.0000 [IU] | Freq: Three times a day (TID) | SUBCUTANEOUS | Status: DC
  Administered 2014-07-29: 3 [IU] via SUBCUTANEOUS
  Administered 2014-07-30: 2 [IU] via SUBCUTANEOUS

## 2014-07-28 MED ORDER — INSULIN DETEMIR 100 UNIT/ML FLEXPEN: 8.0000 [IU] | PEN_INJECTOR | Freq: Every day | SUBCUTANEOUS | Status: DC

## 2014-07-28 MED ORDER — INSULIN ASPART 100 UNIT/ML ~~LOC~~ SOLN
5.0000 [IU] | Freq: Once | SUBCUTANEOUS | Status: AC
  Administered 2014-07-28: 5 [IU] via SUBCUTANEOUS

## 2014-07-28 MED ORDER — CARVEDILOL 6.25 MG PO TABS
6.2500 mg | ORAL_TABLET | Freq: Two times a day (BID) | ORAL | Status: DC
  Administered 2014-07-28 – 2014-07-30 (×5): 6.25 mg via ORAL
  Filled 2014-07-28 (×7): qty 1

## 2014-07-28 MED ORDER — OXYCODONE-ACETAMINOPHEN 5-325 MG PO TABS: 1.0000 | ORAL_TABLET | ORAL | Status: DC | PRN

## 2014-07-28 MED ORDER — INSULIN DETEMIR 100 UNIT/ML ~~LOC~~ SOLN
8.0000 [IU] | Freq: Every day | SUBCUTANEOUS | Status: DC
  Administered 2014-07-28 – 2014-07-29 (×2): 8 [IU] via SUBCUTANEOUS
  Filled 2014-07-28 (×3): qty 0.08

## 2014-07-28 MED ORDER — LACTULOSE 10 GM/15ML PO SOLN
20.0000 g | Freq: Two times a day (BID) | ORAL | Status: DC
  Administered 2014-07-28 – 2014-07-29 (×3): 20 g via ORAL
  Filled 2014-07-28 (×6): qty 30

## 2014-07-28 MED ORDER — SUCRALFATE 1 G PO TABS
1.0000 g | ORAL_TABLET | Freq: Three times a day (TID) | ORAL | Status: DC
  Administered 2014-07-28 – 2014-07-30 (×11): 1 g via ORAL
  Filled 2014-07-28 (×13): qty 1

## 2014-07-28 MED ORDER — MORPHINE SULFATE 2 MG/ML IJ SOLN: 2.0000 mg | INTRAMUSCULAR | Status: DC | PRN

## 2014-07-28 MED ORDER — INSULIN ASPART 100 UNIT/ML ~~LOC~~ SOLN
3.0000 [IU] | Freq: Once | SUBCUTANEOUS | Status: AC
Start: 2014-07-28 — End: 2014-07-28
  Administered 2014-07-28: 3 [IU] via SUBCUTANEOUS

## 2014-07-28 NOTE — Progress Notes (Signed)
CBG 279.  Paged MD to inform.  Md ordered 5 units of insulin to be given. Vicie Mutters, RN

## 2014-07-28 NOTE — Care Management Note (Signed)
    Page 1 of 1   07/28/2014     10:26:59 AM CARE MANAGEMENT NOTE 07/28/2014  Patient:  [name], [name]   Account Number:  [id number]  Date Initiated:  07/28/2014  Documentation initiated by:  Elissa Hefty  Subjective/Objective Assessment:   adm w abd pain, inc wbc     Action/Plan:   lives alone, pcp dr Jeanie Cooks   Anticipated DC Date:  07/30/2014   Anticipated DC Plan:  HOME/SELF CARE         Choice offered to / List presented to:             Status of service:   Medicare Important Message given?   (If response is "NO", the following Medicare IM given date fields will be blank) Date Medicare IM given:   Medicare IM given by:   Date Additional Medicare IM given:   Additional Medicare IM given by:    Discharge Disposition:    Per UR Regulation:  Reviewed for med. necessity/level of care/duration of stay  If discussed at [place] of Stay Meetings, dates discussed:    Comments:

## 2014-07-28 NOTE — Progress Notes (Signed)
[name] KXF:[id number] DOB: 1946-01-17 DOA: 07/26/2014 PCP: Philis Fendt, MD  Brief narrative: 68 year old female cirrhosis, COPD on chronic O2, type 2 diabetes mellitus with renal manifestations, grade 1 diastolic dysfunction 69/6789 CK due to 23, right hemicolectomy 2012 re -admitted 07/27/14 with nausea vomiting diarrhea in setting of recent large GI bleed with hypovolemic shock Found to have multiple metabolic abnormalities inclusive of anion gap 17 and ketones positive and admitted for viral gastroenteritis with precipitated DKA  Past medical history-As per Problem list Chart reviewed as below- Reviewed  Consultants: None  Procedures: None  Antibiotics: Clindamycin  Subjective   Doing fair.  Slept the whole night acc to nursing. Still has abdominal pain Mild nausea No cp or sob Thinks she can take po meds No fever no chills No family h/o IBD Weak but feeling a little better than prior Absd pain remains in the lower quadrant   Objective    Interim History:   Telemetry: Sinus rhythm    Objective: Filed Vitals:   07/28/14 0400 07/28/14 0500 07/28/14 0600 07/28/14 0700  BP: 140/88 159/73 173/81 168/88  Pulse: 95 96 71 87  Temp:      TempSrc:      Resp: 25 23 20 23   Height:      Weight:      SpO2: 97% 96% 96% 97%    Intake/Output Summary (Last 24 hours) at 07/28/14 0756 Last data filed at 07/28/14 0700  Gross per 24 hour  Intake 1771.25 ml  Output      0 ml  Net 1771.25 ml    Exam:  General: Alert pleasant oriented no apparent distress  Cardiovascular: S1-S2 no murmur rub or gallop  Respiratory: Clinically clear no added sound  Abdomen: Soft nontender slighlt distended, bs decreased, no rebound, mildy tender Skin no lower extremity edema Neuro range of motion intact  Data Reviewed: Basic Metabolic Panel:  Recent Labs Lab 07/27/14 0720 07/27/14 1030 07/27/14 1705 07/27/14 1817 07/28/14 0510  NA 143 139 138 136* 139  K 3.6*  3.5* 4.4 3.7 3.5*  CL 104 100 101 99 101  CO2 26 24 24 23 25   GLUCOSE 65* 151* 146* 356* 256*  BUN 10 10 10 10 11   CREATININE 1.07 1.07 1.13* 1.17* 1.27*  CALCIUM 9.0 8.8 8.5 8.4 8.4   Liver Function Tests:  Recent Labs Lab 07/26/14 1230  AST 16  ALT 8  ALKPHOS 91  BILITOT 0.4  PROT 7.0  ALBUMIN 3.3*    Recent Labs Lab 07/26/14 1230  LIPASE 8*   No results for input(s): AMMONIA in the last 168 hours. CBC:  Recent Labs Lab 07/26/14 1230 07/26/14 1847 07/27/14 0720 07/27/14 1817 07/28/14 0610  WBC 13.6* 13.5* 15.5* 15.1* 13.5*  NEUTROABS 11.5* 12.1* 12.3* 11.9* PENDING  HGB 10.9* 10.5* 11.9* 11.1* 11.0*  HCT 35.0* 33.8* 38.0 36.4 35.0*  MCV 82.2 80.5 81.0 81.4 82.4  PLT 314 334 373 414* 367   Cardiac Enzymes: No results for input(s): CKTOTAL, CKMB, CKMBINDEX, TROPONINI in the last 168 hours. BNP: Invalid input(s): POCBNP CBG:  Recent Labs Lab 07/27/14 1303 07/27/14 1354 07/27/14 1550 07/27/14 2332 07/28/14 0407  GLUCAP 87 92 96 250* 279*    Recent Results (from the past 240 hour(s))  MRSA PCR Screening     Status: None   Collection Time: 07/26/14  9:36 PM  Result Value Ref Range Status   MRSA by PCR NEGATIVE NEGATIVE Final    Comment:  The GeneXpert MRSA Assay (FDA approved for NASAL specimens only), is one component of a comprehensive MRSA colonization surveillance program. It is not intended to diagnose MRSA infection nor to guide or monitor treatment for MRSA infections.      Studies:              All Imaging reviewed and is as per above notation   Scheduled Meds: . antiseptic oral rinse  7 mL Mouth Rinse BID  . carvedilol  6.25 mg Oral BID WC  . cilostazol  50 mg Oral BID AC  . clindamycin (CLEOCIN) IV  600 mg Intravenous Q8H  . cloNIDine  0.3 mg Transdermal Q Sun  . insulin aspart  0-9 Units Subcutaneous TID WC  . insulin aspart  5 Units Subcutaneous Once  . insulin detemir  8 Units Subcutaneous QHS  . metoprolol  5 mg  Intravenous 4 times per day  . montelukast  10 mg Oral QHS  . pantoprazole  40 mg Oral Daily  . sodium chloride  10-40 mL Intracatheter Q12H  . sucralfate  1 g Oral TID WC & HS   Continuous Infusions: . sodium chloride    . sodium chloride 125 mL/hr at 07/26/14 1818  . dextrose 5 % and 0.45% NaCl 75 mL/hr at 07/28/14 0515     Assessment/Plan:  1. DKA, initial anion gap 17, currently 13. Resolved. Continue clear liquids and monitor.   2. COPD-currently stable at present time. 3. Peptic ulcer disease with GI bleed in 2011-more recent lower GI bleed 2015.  Cautiously re-started ASA and cilastozol. Added Carafate as unclear if GErd component as well 4. Metabolic acidosis-likely secondary to DKA. Continue IV saline 125-->50 cc/hour.  Resolving slowly 5. Early mild sepsis secondary to Viral gastroenteritis-initial lactic acid 2.38 , pro-calcitonin less than 1.0 to 4 Will consider tapering off antibiotic once stool cultures return-she has had no stool so far-For now continue clindamycin. White count is elevated but this may be stress response from diarrhea state. Potentially noravirus. Patient was making some improvement.  6. Htn-elevated and not controlled-added back Clonidine on 11/29as a patch 0.3 mg.  On hydralazine 20 q6 prn and Metoprolol 5 mg q6 with parameters. Coreg added back 11/30  7. CKD stage III-renal function within normal limits. Replace potassium if falls below 3.2. 8. Sinus tachycardia-add back coreg 6.25 bid.  Keep metoprolol with parameters 9. Atypical chest pain-no chest pain right now had a low risk Myoview 2012. Hold aspirin for now 10. Peripheral vascular disease 2009 -outpatient follow-up  Code Status: Full Family Communication: discussed with her friend [name] on phone 07/28/14 Disposition Plan: Likely transition to telemetry if no other issues  35 min   [name], MD  [facility] Pager [phone] 07/28/2014, 7:56 AM    LOS: 2 days

## 2014-07-28 NOTE — Progress Notes (Addendum)
Patient on q 4 hour CBG checks. 2000 CBG =201 & 2400 CBG =250. Paged MD to inform.  MD ordered 3 units of insulin to be given.   Vicie Mutters, RN

## 2014-07-29 LAB — COMPREHENSIVE METABOLIC PANEL
ALT: 5 U/L (ref 0–35)
AST: 12 U/L (ref 0–37)
Albumin: 2.5 g/dL — ABNORMAL LOW (ref 3.5–5.2)
Alkaline Phosphatase: 63 U/L (ref 39–117)
Anion gap: 12 (ref 5–15)
BUN: 13 mg/dL (ref 6–23)
CO2: 23 meq/L (ref 19–32)
CREATININE: 1.78 mg/dL — AB (ref 0.50–1.10)
Calcium: 8.2 mg/dL — ABNORMAL LOW (ref 8.4–10.5)
Chloride: 105 mEq/L (ref 96–112)
GFR calc Af Amer: 32 mL/min — ABNORMAL LOW (ref >90)
GFR, EST NON AFRICAN AMERICAN: 28 mL/min — AB (ref >90)
Glucose, Bld: 156 mg/dL — ABNORMAL HIGH (ref 70–99)
Potassium: 3.3 mEq/L — ABNORMAL LOW (ref 3.7–5.3)
SODIUM: 140 meq/L (ref 137–147)
TOTAL PROTEIN: 5.6 g/dL — AB (ref 6.0–8.3)
Total Bilirubin: 0.3 mg/dL (ref 0.3–1.2)

## 2014-07-29 LAB — CBC WITH DIFFERENTIAL/PLATELET
BASOS ABS: 0 10*3/uL (ref 0.0–0.1)
BASOS PCT: 0 % (ref 0–1)
Basophils Absolute: 0 10*3/uL (ref 0.0–0.1)
Basophils Relative: 0 % (ref 0–1)
EOS ABS: 0.1 10*3/uL (ref 0.0–0.7)
EOS PCT: 1 % (ref 0–5)
Eosinophils Absolute: 0.1 10*3/uL (ref 0.0–0.7)
Eosinophils Relative: 1 % (ref 0–5)
HCT: 30 % — ABNORMAL LOW (ref 36.0–46.0)
HEMATOCRIT: 32.3 % — AB (ref 36.0–46.0)
HEMOGLOBIN: 9.1 g/dL — AB (ref 12.0–15.0)
HEMOGLOBIN: 10.1 g/dL — AB (ref 12.0–15.0)
LYMPHS ABS: 2.1 10*3/uL (ref 0.7–4.0)
LYMPHS PCT: 21 % (ref 12–46)
Lymphocytes Relative: 29 % (ref 12–46)
Lymphs Abs: 3.2 10*3/uL (ref 0.7–4.0)
MCH: 24.8 pg — ABNORMAL LOW (ref 26.0–34.0)
MCH: 25.8 pg — AB (ref 26.0–34.0)
MCHC: 30.3 g/dL (ref 30.0–36.0)
MCHC: 31.3 g/dL (ref 30.0–36.0)
MCV: 81.7 fL (ref 78.0–100.0)
MCV: 82.4 fL (ref 78.0–100.0)
MONO ABS: 0.9 10*3/uL (ref 0.1–1.0)
MONOS PCT: 9 % (ref 3–12)
MONOS PCT: 8 % (ref 3–12)
Monocytes Absolute: 0.9 10*3/uL (ref 0.1–1.0)
NEUTROS PCT: 69 % (ref 43–77)
Neutro Abs: 7.1 10*3/uL (ref 1.7–7.7)
Neutro Abs: 7 10*3/uL (ref 1.7–7.7)
Neutrophils Relative %: 62 % (ref 43–77)
PLATELETS: 299 10*3/uL (ref 150–400)
Platelets: 312 10*3/uL (ref 150–400)
RBC: 3.67 MIL/uL — ABNORMAL LOW (ref 3.87–5.11)
RBC: 3.92 MIL/uL (ref 3.87–5.11)
RDW: 15.8 % — ABNORMAL HIGH (ref 11.5–15.5)
RDW: 15.8 % — ABNORMAL HIGH (ref 11.5–15.5)
WBC: 10.3 10*3/uL (ref 4.0–10.5)
WBC: 11.3 10*3/uL — ABNORMAL HIGH (ref 4.0–10.5)

## 2014-07-29 LAB — GI PATHOGEN PANEL BY PCR, STOOL
C DIFFICILE TOXIN A/B: NEGATIVE
Campylobacter by PCR: NEGATIVE
Cryptosporidium by PCR: NEGATIVE
E COLI (ETEC) LT/ST: NEGATIVE
E COLI (STEC): NEGATIVE
E coli 0157 by PCR: NEGATIVE
G lamblia by PCR: NEGATIVE
NOROVIRUS G1/G2: NEGATIVE
Rotavirus A by PCR: NEGATIVE
Salmonella by PCR: NEGATIVE
Shigella by PCR: NEGATIVE

## 2014-07-29 LAB — CLOSTRIDIUM DIFFICILE BY PCR: Toxigenic C. Difficile by PCR: NEGATIVE

## 2014-07-29 LAB — GLUCOSE, CAPILLARY
GLUCOSE-CAPILLARY: 108 mg/dL — AB (ref 70–99)
Glucose-Capillary: 109 mg/dL — ABNORMAL HIGH (ref 70–99)
Glucose-Capillary: 231 mg/dL — ABNORMAL HIGH (ref 70–99)
Glucose-Capillary: 214 mg/dL — ABNORMAL HIGH (ref 70–99)

## 2014-07-29 LAB — OCCULT BLOOD X 1 CARD TO LAB, STOOL: FECAL OCCULT BLD: NEGATIVE

## 2014-07-29 MED ORDER — FLUTICASONE PROPIONATE 50 MCG/ACT NA SUSP
2.0000 | Freq: Every day | NASAL | Status: DC
  Administered 2014-07-29 – 2014-07-30 (×2): 2 via NASAL
  Filled 2014-07-29: qty 16

## 2014-07-29 MED ORDER — ERGOCALCIFEROL 1.25 MG (50000 UT) PO CAPS
50000.0000 [IU] | ORAL_CAPSULE | ORAL | Status: DC
  Administered 2014-07-29: 50000 [IU] via ORAL
  Filled 2014-07-29: qty 1

## 2014-07-29 MED ORDER — ASPIRIN 81 MG PO TBEC
81.0000 mg | DELAYED_RELEASE_TABLET | Freq: Every day | ORAL | Status: DC
  Administered 2014-07-29 – 2014-07-30 (×2): 81 mg via ORAL
  Filled 2014-07-29 (×2): qty 1

## 2014-07-29 MED ORDER — POLYVINYL ALCOHOL 1.4 % OP SOLN
1.0000 [drp] | OPHTHALMIC | Status: DC | PRN
  Filled 2014-07-29: qty 15

## 2014-07-29 MED ORDER — CLONIDINE HCL 0.1 MG PO TABS
0.1000 mg | ORAL_TABLET | Freq: Three times a day (TID) | ORAL | Status: DC
  Administered 2014-07-29 – 2014-07-30 (×3): 0.1 mg via ORAL
  Filled 2014-07-29 (×5): qty 1

## 2014-07-29 MED ORDER — POLYVINYL ALCOHOL 1.4 % OP SOLN
1.0000 [drp] | Freq: Every day | OPHTHALMIC | Status: DC | PRN
  Filled 2014-07-29: qty 15

## 2014-07-29 MED ORDER — FLUTICASONE PROPIONATE 50 MCG/ACT NA SUSP: 2.0000 | Freq: Every day | NASAL | Status: DC

## 2014-07-29 MED ORDER — CLINDAMYCIN HCL 300 MG PO CAPS
300.0000 mg | ORAL_CAPSULE | Freq: Three times a day (TID) | ORAL | Status: DC
  Administered 2014-07-29 – 2014-07-30 (×3): 300 mg via ORAL
  Filled 2014-07-29 (×6): qty 1

## 2014-07-29 MED ORDER — ALLOPURINOL 100 MG PO TABS
100.0000 mg | ORAL_TABLET | Freq: Two times a day (BID) | ORAL | Status: DC
  Administered 2014-07-29 – 2014-07-30 (×3): 100 mg via ORAL
  Filled 2014-07-29 (×5): qty 1

## 2014-07-29 NOTE — Plan of Care (Signed)
Problem: Phase I Progression Outcomes Goal: Pain controlled with appropriate interventions Outcome: Completed/Met Date Met:  07/29/14 Goal: Initial discharge plan identified Outcome: Completed/Met Date Met:  07/29/14 Goal: Voiding-avoid urinary catheter unless indicated Outcome: Completed/Met Date Met:  07/29/14 Goal: Hemodynamically stable Outcome: Completed/Met Date Met:  07/29/14

## 2014-07-29 NOTE — Progress Notes (Signed)
GI panel has not resulted, C. Diff is negative. Consulted with Jackalyn Lombard from [facility]. She said it was ok to take pt off of enteric precautions.   Penni Bombard, RN 07/29/2014 6:47 PM

## 2014-07-29 NOTE — Plan of Care (Signed)
Problem: Phase I Progression Outcomes Goal: Pain controlled with appropriate interventions Outcome: Progressing Goal: OOB as tolerated unless otherwise ordered Outcome: Completed/Met Date Met:  07/29/14 Goal: Voiding-avoid urinary catheter unless indicated Outcome: Progressing  Problem: Phase II Progression Outcomes Goal: Progress activity as tolerated unless otherwise ordered Outcome: Completed/Met Date Met:  07/29/14 Goal: Vital signs remain stable Outcome: Progressing Goal: Obtain order to discontinue catheter if appropriate Outcome: Not Applicable Date Met:  97/67/34  Problem: Phase III Progression Outcomes Goal: Pain controlled on oral analgesia Outcome: Progressing

## 2014-07-29 NOTE — Progress Notes (Signed)
[name] RFF:[id number] DOB: [birth year] DOA: 07/26/2014 PCP: Philis Fendt, MD  Brief narrative: 68 year old female cirrhosis, COPD on chronic O2, type 2 diabetes mellitus with renal manifestations, grade 1 diastolic dysfunction 35/7017 CK due to 23, right hemicolectomy 2012 re -admitted 07/27/14 with nausea vomiting diarrhea in setting of recent large GI bleed with hypovolemic shock Found to have multiple metabolic abnormalities inclusive of anion gap 17 and ketones positive and admitted for viral gastroenteritis with precipitated DKA  Past medical history-As per Problem list Chart reviewed as below- Reviewed  Consultants: None  Procedures: None  Antibiotics: Clindamycin  Subjective   Much better Abd pain completely resolved 2 watery stools overnight Npo CP or fever NO cp or sob    Objective    Interim History:   Telemetry: Sinus rhythm    Objective: Filed Vitals:   07/28/14 1754 07/28/14 2034 07/29/14 0602 07/29/14 1000  BP: 130/76 124/57 130/57 129/64  Pulse: 111 113 112 90  Temp: 98 F (36.7 C) 98.9 F (37.2 C) 98.5 F (36.9 C) 98.2 F (36.8 C)  TempSrc: Oral   Oral  Resp: 18 18 18 18   Height:      Weight:  65 kg (143 lb 4.8 oz)    SpO2: 100% 100% 90% 93%    Intake/Output Summary (Last 24 hours) at 07/29/14 1020 Last data filed at 07/29/14 0900  Gross per 24 hour  Intake  637.5 ml  Output      0 ml  Net  637.5 ml    Exam:  General: Alert pleasant oriented no apparent distress  Cardiovascular: S1-S2 no murmur rub or gallop  Respiratory: Clinically clear no added sound  Abdomen: Soft nontender slighlt distended, bs decreased, no rebound, mildy tender  Data Reviewed: Basic Metabolic Panel:  Recent Labs Lab 07/27/14 1030 07/27/14 1705 07/27/14 1817 07/28/14 0510 07/29/14 0505  NA 139 138 136* 139 140  K 3.5* 4.4 3.7 3.5* 3.3*  CL 100 101 99 101 105  CO2 24 24 23 25 23   GLUCOSE 151* 146* 356* 256* 156*  BUN 10 10 10 11 13     CREATININE 1.07 1.13* 1.17* 1.27* 1.78*  CALCIUM 8.8 8.5 8.4 8.4 8.2*   Liver Function Tests:  Recent Labs Lab 07/26/14 1230 07/29/14 0505  AST 16 12  ALT 8 5  ALKPHOS 91 63  BILITOT 0.4 0.3  PROT 7.0 5.6*  ALBUMIN 3.3* 2.5*    Recent Labs Lab 07/26/14 1230  LIPASE 8*   No results for input(s): AMMONIA in the last 168 hours. CBC:  Recent Labs Lab 07/27/14 0720 07/27/14 1817 07/28/14 0610 07/28/14 1654 07/29/14 0505  WBC 15.5* 15.1* 13.5* 11.3* 10.3  NEUTROABS 12.3* 11.9* 10.4* 8.1* 7.1  HGB 11.9* 11.1* 11.0* 10.0* 9.1*  HCT 38.0 36.4 35.0* 33.2* 30.0*  MCV 81.0 81.4 82.4 82.0 81.7  PLT 373 414* 367 331 299   Cardiac Enzymes: No results for input(s): CKTOTAL, CKMB, CKMBINDEX, TROPONINI in the last 168 hours. BNP: Invalid input(s): POCBNP CBG:  Recent Labs Lab 07/28/14 0816 07/28/14 1635 07/28/14 1651 07/28/14 2029 07/29/14 0920  GLUCAP 160* 108* 123* 283* 109*    Recent Results (from the past 240 hour(s))  MRSA PCR Screening     Status: None   Collection Time: 07/26/14  9:36 PM  Result Value Ref Range Status   MRSA by PCR NEGATIVE NEGATIVE Final    Comment:        The GeneXpert MRSA Assay (FDA approved for NASAL specimens only),  is one component of a comprehensive MRSA colonization surveillance program. It is not intended to diagnose MRSA infection nor to guide or monitor treatment for MRSA infections.      Studies:              All Imaging reviewed and is as per above notation   Scheduled Meds: . allopurinol  100 mg Oral BID  . antiseptic oral rinse  7 mL Mouth Rinse BID  . aspirin  81 mg Oral Daily  . carvedilol  6.25 mg Oral BID WC  . cilostazol  50 mg Oral BID AC  . clindamycin  300 mg Oral 3 times per day  . cloNIDine  0.3 mg Transdermal Q Sun  . cloNIDine  0.1 mg Oral TID  . ergocalciferol  50,000 Units Oral Weekly  . fluticasone  2 spray Each Nare Daily  . fluticasone  2 spray Each Nare Daily  . insulin aspart  0-9 Units  Subcutaneous TID WC  . insulin aspart  3 Units Subcutaneous TID WC  . insulin aspart  5 Units Subcutaneous Once  . insulin detemir  8 Units Subcutaneous QHS  . lactulose  20 g Oral BID  . metoprolol  5 mg Intravenous 4 times per day  . montelukast  10 mg Oral QHS  . pantoprazole  40 mg Oral Daily  . sodium chloride  10-40 mL Intracatheter Q12H  . sucralfate  1 g Oral TID WC & HS   Continuous Infusions: . sodium chloride 50 mL/hr at 07/28/14 1700     Assessment/Plan:  1. DKA, initial anion gap 17, currently 13. Resolved. gradaute diet to [facility] Diabetic and monitor  2. COPD-currently stable at present time. 3. Peptic ulcer disease with GI bleed in 2011-more recent lower GI bleed 2015.  Cautiously re-started ASA and cilastozol. Added Carafate as unclear if Jerrye Bushy component as well-this seems to have helped her abd pain 4. Metabolic acidosis-likely secondary to DKA. Continue IV saline 125-->50 cc/hour.  Resolving slowly 5. Early mild sepsis secondary to Viral gastroenteritis-initial lactic acid 2.38 , pro-calcitonin less than 1.0 to 4 await GI pathogen panel-For now continue clindamycin as po. White count is decreased from admission 15-->10.3 on 12/. Potentially noravirus. Patient was making some improvement.  6. Htn-elevated and not controlled-added back Clonidine on 11/29as a patch 0.3 mg.  On hydralazine 20 q6 prn and Metoprolol 5 mg q6 with parameters. Coreg added back 11/30 -adjust s needed 7. CKD stage III-renal function within normal limits. Replace potassium if falls below 3.2. 8. Sinus tachycardia-add back coreg 6.25 bid.  Keep metoprolol with parameters 9. Atypical chest pain-no chest pain right now had a low risk Myoview 2012. Re-start ASA 10. Peripheral vascular disease 2009 -outpatient follow-up 11. Gout-restart Allopurinol 12. Seasonal allergies-re-started flonase  Code Status: Full Family Communication: no family at bedside-to update Cynthis her friend herself.  Need DM  education Disposition Plan: Likely d/c in 1-2 days if stool cult comes back neg-would cover otherwise with maybe 7-10 days po clindaymycin  15 min   [name], MD  [facility] Pager [phone] 07/29/2014, 10:20 AM    LOS: 3 days

## 2014-07-30 DIAGNOSIS — R1032 Left lower quadrant pain: Secondary | ICD-10-CM

## 2014-07-30 DIAGNOSIS — R1031 Right lower quadrant pain: Secondary | ICD-10-CM

## 2014-07-30 LAB — CBC WITH DIFFERENTIAL/PLATELET
BASOS PCT: 0 % (ref 0–1)
Basophils Absolute: 0 10*3/uL (ref 0.0–0.1)
EOS PCT: 1 % (ref 0–5)
Eosinophils Absolute: 0.1 10*3/uL (ref 0.0–0.7)
HEMATOCRIT: 26.9 % — AB (ref 36.0–46.0)
Hemoglobin: 8.5 g/dL — ABNORMAL LOW (ref 12.0–15.0)
LYMPHS PCT: 27 % (ref 12–46)
Lymphs Abs: 2.3 10*3/uL (ref 0.7–4.0)
MCH: 26 pg (ref 26.0–34.0)
MCHC: 31.6 g/dL (ref 30.0–36.0)
MCV: 82.3 fL (ref 78.0–100.0)
MONO ABS: 0.7 10*3/uL (ref 0.1–1.0)
Monocytes Relative: 8 % (ref 3–12)
NEUTROS ABS: 5.6 10*3/uL (ref 1.7–7.7)
Neutrophils Relative %: 64 % (ref 43–77)
PLATELETS: 261 10*3/uL (ref 150–400)
RBC: 3.27 MIL/uL — ABNORMAL LOW (ref 3.87–5.11)
RDW: 15.8 % — AB (ref 11.5–15.5)
WBC: 8.7 10*3/uL (ref 4.0–10.5)

## 2014-07-30 LAB — GLUCOSE, CAPILLARY
GLUCOSE-CAPILLARY: 201 mg/dL — AB (ref 70–99)
GLUCOSE-CAPILLARY: 112 mg/dL — AB (ref 70–99)
GLUCOSE-CAPILLARY: 107 mg/dL — AB (ref 70–99)
Glucose-Capillary: 195 mg/dL — ABNORMAL HIGH (ref 70–99)

## 2014-07-30 LAB — COMPREHENSIVE METABOLIC PANEL
ALK PHOS: 58 U/L (ref 39–117)
ALT: 6 U/L (ref 0–35)
ANION GAP: 13 (ref 5–15)
AST: 12 U/L (ref 0–37)
Albumin: 2.2 g/dL — ABNORMAL LOW (ref 3.5–5.2)
BUN: 14 mg/dL (ref 6–23)
CHLORIDE: 105 meq/L (ref 96–112)
CO2: 20 meq/L (ref 19–32)
CREATININE: 1.76 mg/dL — AB (ref 0.50–1.10)
Calcium: 7.9 mg/dL — ABNORMAL LOW (ref 8.4–10.5)
GFR calc Af Amer: 33 mL/min — ABNORMAL LOW (ref >90)
GFR, EST NON AFRICAN AMERICAN: 28 mL/min — AB (ref >90)
GLUCOSE: 130 mg/dL — AB (ref 70–99)
POTASSIUM: 3.5 meq/L — AB (ref 3.7–5.3)
Sodium: 138 mEq/L (ref 137–147)
Total Bilirubin: 0.2 mg/dL — ABNORMAL LOW (ref 0.3–1.2)
Total Protein: 5 g/dL — ABNORMAL LOW (ref 6.0–8.3)

## 2014-07-30 LAB — PROTIME-INR
INR: 1.21 (ref 0.00–1.49)
Prothrombin Time: 15.4 seconds — ABNORMAL HIGH (ref 11.6–15.2)

## 2014-07-30 MED ORDER — POTASSIUM CHLORIDE CRYS ER 20 MEQ PO TBCR
40.0000 meq | EXTENDED_RELEASE_TABLET | Freq: Once | ORAL | Status: AC
Start: 2014-07-30 — End: 2014-07-30
  Administered 2014-07-30: 40 meq via ORAL
  Filled 2014-07-30: qty 2

## 2014-07-30 MED ORDER — INSULIN GLARGINE 100 UNIT/ML ~~LOC~~ SOLN: 8.0000 [IU] | Freq: Every day | SUBCUTANEOUS | Status: DC

## 2014-07-30 MED ORDER — INSULIN DETEMIR 100 UNIT/ML FLEXPEN: 12.0000 [IU] | PEN_INJECTOR | Freq: Every day | SUBCUTANEOUS | Status: DC

## 2014-07-30 MED ORDER — INSULIN SYRINGE-NEEDLE U-100 25G X 1" 1 ML MISC
Status: DC
Start: 2014-07-30 — End: 2015-06-03

## 2014-07-30 MED ORDER — FREESTYLE SYSTEM KIT: 1.0000 | PACK | Freq: Three times a day (TID) | Status: DC

## 2014-07-30 MED ORDER — INSULIN ASPART 100 UNIT/ML ~~LOC~~ SOLN: SUBCUTANEOUS | Status: DC

## 2014-07-30 NOTE — Discharge Summary (Addendum)
[name], is a 68 y.o. female  DOB July 03, 1946  MRN [id number].  Admission date:  07/26/2014  Admitting Physician  Nita Sells, MD  Discharge Date:  07/30/2014   Primary MD  Philis Fendt, MD  Recommendations for primary care physician for things to follow:   Monitor glycemic control closely.   Admission Diagnosis  Colitis [K52.9] Acute mesenteric ischemia [K55.0] Abdominal pain [R10.9] Diabetic ketoacidosis without coma associated with other specified diabetes mellitus [E13.10]   Discharge Diagnosis  Colitis [K52.9] Acute mesenteric ischemia [K55.0] Abdominal pain [R10.9] Diabetic ketoacidosis without coma associated with other specified diabetes mellitus [E13.10]    Principal Problem:   Abdominal pain, acute, bilateral lower quadrant Active Problems:   COPD (chronic obstructive pulmonary disease)   Sleep apnea   Controlled diabetes mellitus type 2 with complications   CKD (chronic kidney disease), stage III   Atypical chest pain- no history of CAD. Myoview low risk 2012   PVD, LEIA/LIIA and RCIA pta 7/09- ABIs 0.68 and 0.69 Feb 2014   PUD (peptic ulcer disease) GI bleed in past (2011)   Claudication   Colitis      Past Medical History  Diagnosis Date  . Alcoholic cirrhosis   . Reflux esophagitis   . Gastroesophageal reflux   . Colon polyps   . CHF (congestive heart failure) 10/13    grade 1 diastolic dysfunction, Nl LVF  . Hypertension   . History of GI diverticular bleed   . Pneumonia     history of  . Asthma   . Anemia   . Blood transfusion few years ago  . Villous adenoma of colon 05/10/11  . PVD (peripheral vascular disease) 2009    bilat iliac stenting  . Chronic obstructive pulmonary disease (COPD)   . Arthritis   . Glaucoma     left  . Gout   . Acute venous embolism and  thrombosis of deep vessels of proximal lower extremity   . Back pain   . Leg swelling   . Diabetes mellitus     insulin dep   . Hepatitis     alcoholic hepatitis  . Depression   . Anxiety   . On home oxygen therapy oxygen 2 liter per minute prn per [place]    Past Surgical History  Procedure Laterality Date  . Cataract extraction Right yrs ago  . Colonoscopy    . Laparoscopic assissted total colectomy w/ j-pouch  04/22/11  . Ventral hernia repair  06/13/2012    Procedure: LAPAROSCOPIC VENTRAL HERNIA;  Surgeon: Adin Hector, MD;  Location: [facility];  Service: General;  Laterality: N/A;  Laparoscopic Assisted Ventral Hernia with Mesh  . Abdominal hysterectomy  yrs ago  . Esophagogastroduodenoscopy (egd) with propofol N/A 08/06/2013    Procedure: ESOPHAGOGASTRODUODENOSCOPY (EGD) WITH PROPOFOL;  Surgeon: Lear Ng, MD;  Location: [facility];  Service: Endoscopy;  Laterality: N/A;  . Colonoscopy with propofol N/A 08/06/2013    Procedure: COLONOSCOPY WITH PROPOFOL;  Surgeon: Lear Ng, MD;  Location: [facility];  Service: Endoscopy;  Laterality: N/A;       History of present illness and  Hospital Course:     Kindly see H&P for history of present illness and admission details, please review complete Labs, Consult reports and Test reports for all details in brief  HPI  from the history and physical done on the day of admission   67 ?, h/o cirrhosis on [place], COPD on chronic O2, Ty 2 DM c renal manifestation, Grade 1 diastolic dysfunction ECHO 05/2012, CKD stage 2-3, R hemicolectomy 2012 large villous adenoma EGD + Colonoscopy 07/2013 Diverticulosis admitted from home after recent stay at [facility]. She states over the past 3-4 days whereas her blood sugars asre usually uin the 80-90 range, her sugars have been as high as 300-500. On 07/18/14 AM she awoke around 8:30 with feelings of nausea. She also had episodes of coughing and proceeded only to eat  half a boiled egg she did not really eat much yesterday-with the cough she had episodes of diarrhea-her stool was formed but soft . She has been vomiting mainly liquids she denies any chest pain. She felt like she was warm to touch and to have diarrhea and loose stool every time she had a cough. Her cough and loose stool eventually subsided and she indeed to have nausea and vomiting. She states that when she was at the nursing facility she had someone in the room with her who had similar symptoms of sickness and illness. She states she has generalized abdominal pain and Hemoccult performed here was negative although it appeared reddish to practitioner who saw her.      Hospital Course    1.DM2 with Mild DKA - resolved, A1c was 8, she wanted to go on Lantus 8 units which she was taking before and it has been ordered, added sliding scale, testing supplies provided, will see diabetic educator prior to discharge and follow with PCP closely. Given written instructions to do CBGs every before meals at bedtime and maintain a logbook. 2 showed a logbook to PCP in the next 2-3 days.    2. Viral gastroenteritis with mild sepsis. Completely resolved with supportive care. C. difficile PCR and GI pathogen panel negative.    3. History of peptic ulcer disease in the remote past and lower GI bleed recently. Stable no acute issues. Outpatient follow-up with GI post discharge as needed.   4. Essential hypertension. Stable. Home regimen of clonidine and Coreg continued.    5. CK D stage III. Stable. Renal function at baseline.    6. History of gout. Continue allopurinol.    7. PAD. Currently on aspirin, Pletal, blood pressure medications for secondary prevention. No acute issues.     Discharge Condition: Stable   Follow UP  Follow-up Information    Follow up with AVBUERE,EDWIN A, MD. Schedule an appointment as soon as possible for a visit in 2 days.   Specialty:  Internal Medicine    Contact information:   [address] [place] [address] [phone]         Discharge Instructions  and  Discharge Medications          Discharge Instructions    Discharge instructions    Complete by:  As directed   Follow with Primary MD Nolene Ebbs A, MD in 2-3 days   Get CBC, CMP, 2 view Chest X ray checked  by Primary MD next visit.    Activity: As tolerated with Full fall precautions  use walker/cane & assistance as needed   Disposition Home     Diet: Diabetic Low Carb diet.  Accuchecks 4 times/day, Once in AM empty stomach and then before each meal. Log in all results and show them to your Prim.MD in 3 days. If any glucose reading is under 80 or above 300 call your Prim MD immidiately. Follow Low glucose instructions for glucose under 80 as instructed.    For Heart failure patients - Check your Weight same time everyday, if you gain over 2 pounds, or you develop in leg swelling, experience more shortness of breath or chest pain, call your Primary MD immediately. Follow Cardiac Low Salt Diet and 1.8 lit/day fluid restriction.   On your next visit with your primary care physician please Get Medicines reviewed and adjusted.   Please request your Prim.MD to go over all Hospital Tests and Procedure/Radiological results at the follow up, please get all Hospital records sent to your Prim MD by signing hospital release before you go home.   If you experience worsening of your admission symptoms, develop shortness of breath, life threatening emergency, suicidal or homicidal thoughts you must seek medical attention immediately by calling 911 or calling your MD immediately  if symptoms less severe.  You Must read complete instructions/literature along with all the possible adverse reactions/side effects for all the Medicines you take and that have been prescribed to you. Take any new Medicines after you have completely understood and accpet all the possible adverse  reactions/side effects.   Do not drive, operating heavy machinery, perform activities at heights, swimming or participation in water activities or provide baby sitting services if your were admitted for syncope or siezures until you have seen by Primary MD or a Neurologist and advised to do so again.  Do not drive when taking Pain medications.    Do not take more than prescribed Pain, Sleep and Anxiety Medications  Special Instructions: If you have smoked or chewed Tobacco  in the last 2 yrs please stop smoking, stop any regular Alcohol  and or any Recreational drug use.  Wear Seat belts while driving.   Please note  You were cared for by a hospitalist during your hospital stay. If you have any questions about your discharge medications or the care you received while you were in the hospital after you are discharged, you can call the unit and asked to speak with the hospitalist on call if the hospitalist that took care of you is not available. Once you are discharged, your primary care physician will handle any further medical issues. Please note that NO REFILLS for any discharge medications will be authorized once you are discharged, as it is imperative that you return to your primary care physician (or establish a relationship with a primary care physician if you do not have one) for your aftercare needs so that they can reassess your need for medications and monitor your lab values.     Increase activity slowly    Complete by:  As directed             Medication List    STOP taking these medications        LEVEMIR FLEXTOUCH 100 UNIT/ML Pen  Generic drug:  Insulin Detemir      TAKE these medications        albuterol 108 (90 BASE) MCG/ACT inhaler  Commonly known as:  PROVENTIL HFA;VENTOLIN HFA  Inhale 2 puffs into the lungs every 6 (six) hours as needed.  For shortness of breath     albuterol (2.5 MG/3ML) 0.083% nebulizer solution  Commonly known as:  PROVENTIL  Take 2.5 mg by  nebulization every 6 (six) hours as needed for wheezing or shortness of breath.     allopurinol 100 MG tablet  Commonly known as:  ZYLOPRIM  Take 100 mg by mouth 2 (two) times daily.     aspirin 81 MG EC tablet  Take 81 mg by mouth daily.     BLINK TEARS OP  Place 1 drop into the right eye daily as needed (dryness).     carvedilol 6.25 MG tablet  Commonly known as:  COREG  Take 1 tablet (6.25 mg total) by mouth 2 (two) times daily with a meal.     cilostazol 100 MG tablet  Commonly known as:  PLETAL  Take 50 mg by mouth 2 (two) times daily before a meal.     cloNIDine 0.1 MG tablet  Commonly known as:  CATAPRES  Take 1 tablet (0.1 mg total) by mouth 3 (three) times daily.     diclofenac sodium 1 % Gel  Commonly known as:  VOLTAREN  Apply 1 application topically daily as needed (leg pain). For leg pain     ergocalciferol 50000 UNITS capsule  Commonly known as:  VITAMIN D2  Take 50,000 Units by mouth once a week. On Monday     fluticasone 50 MCG/ACT nasal spray  Commonly known as:  FLONASE  Place 2 sprays into the nose daily. 50 mcg /act 2 sprays nasal once a day     folic acid 1 MG tablet  Commonly known as:  FOLVITE  Take 1 mg by mouth daily.     glucose monitoring kit monitoring kit  1 each by Does not apply route 4 (four) times daily - after meals and at bedtime. 1 month Diabetic Testing Supplies for QAC-QHS accuchecks.Any brand OK     insulin aspart 100 UNIT/ML injection  Commonly known as:  novoLOG  - Before each meal 3 times a day, 140-199 - 2 units, 200-250 - 4 units, 251-299 - 6 units,  300-349 - 8 units,  350 or above 10 units.  - Insulin PEN if approved, provide syringes and needles if needed.     insulin glargine 100 UNIT/ML injection  Commonly known as:  LANTUS  Inject 0.08 mLs (8 Units total) into the skin at bedtime. Pen if approved     Insulin Syringe-Needle U-100 25G X 1" 1 ML Misc  Any brand, for 4 times a day insulin SQ, 1 month supply.      montelukast 10 MG tablet  Commonly known as:  SINGULAIR  Take 10 mg by mouth at bedtime.     omeprazole 40 MG capsule  Commonly known as:  PRILOSEC  Take 40 mg by mouth daily.     OXYGEN  Inhale into the lungs. 2 liters nightly     triamcinolone cream 0.5 %  Commonly known as:  KENALOG  Apply 1 application topically daily as needed. For legs          Diet and Activity recommendation: See Discharge Instructions above   Consults obtained - DM educator   Major procedures and Radiology Reports - PLEASE review detailed and final reports for all details, in brief -       Ct Abdomen Pelvis W Contrast  07/26/2014   CLINICAL DATA:  Diffuse abdominal pain since yesterday. Constipation yesterday and diarrhea today.  EXAM: CT ABDOMEN AND  PELVIS WITH CONTRAST  TECHNIQUE: Multidetector CT imaging of the abdomen and pelvis was performed using the standard protocol following bolus administration of intravenous contrast.  CONTRAST:  177m OMNIPAQUE IOHEXOL 300 MG/ML  SOLN  COMPARISON:  Abdomen radiographs obtained earlier today. Abdomen and pelvis CT dated 01/13/2012.  FINDINGS: Mildly dilated esophagus with a possible small sliding hiatal hernia. Interval repair of the previously demonstrated ventral hernia. There is a small ventral hernia today containing fat at the level of the mid pelvis. Multiple colonic diverticula without evidence of diverticulitis. Surgically absent right colon with a small bowel to colon anastomosis.  Minimal diffuse low density of the liver relative to the spleen. Unremarkable spleen, gallbladder, adrenal glands and urinary bladder. Surgically absent uterus and ovaries. Multiple bilateral renal cysts. Diffusely atrophied pancreas containing coarse calcifications. Mild atelectasis or scarring at both lung bases. Thoracolumbar scoliosis and degenerative changes. Stable 30% T11 vertebral compression deformity the and 10% L2 vertebral compression deformity.  IMPRESSION: 1. No  acute abnormality. 2. Mildly dilated esophagus with a possible small sliding hiatal hernia. 3. Interval ventral hernia repair with a small residual or recurrent ventral hernia containing fat. 4. Colonic diverticulosis. 5. Minimal diffuse hepatic steatosis. 6. Stable changes of chronic pancreatitis with diffuse pancreatic atrophy.   Electronically Signed   By: SEnrique SackM.D.   On: 07/26/2014 16:33   Dg Abd Portable 1v  07/26/2014   CLINICAL DATA:  Abdominal pain, nausea/vomiting, history of GI bleed, diverticulitis, and hernia repair  EXAM: PORTABLE ABDOMEN - 1 VIEW  COMPARISON:  06/18/2014  FINDINGS: Paucity of bowel gas, without disproportionate small bowel dilatation to suggest small bowel obstruction.  Vascular iliac stents.  Degenerative changes of the lumbar spine and bilateral hips. Visualized bony pelvis appears intact.  IMPRESSION: Unremarkable abdominal radiograph.   Electronically Signed   By: SJulian HyM.D.   On: 07/26/2014 14:37    Micro Results      Recent Results (from the past 240 hour(s))  MRSA PCR Screening     Status: None   Collection Time: 07/26/14  9:36 PM  Result Value Ref Range Status   MRSA by PCR NEGATIVE NEGATIVE Final    Comment:        The GeneXpert MRSA Assay (FDA approved for NASAL specimens only), is one component of a comprehensive MRSA colonization surveillance program. It is not intended to diagnose MRSA infection nor to guide or monitor treatment for MRSA infections.   Clostridium Difficile by PCR     Status: None   Collection Time: 07/29/14 10:44 AM  Result Value Ref Range Status   C difficile by pcr NEGATIVE NEGATIVE Final     Campylobacter by PCR Negative   C difficile toxin A/B Negative   E coli 0157 by PCR Negative   E coli (ETEC) LT/ST Negative   E coli (STEC) Negative   Salmonella by PCR Negative   Shigella by PCR Negative   Norovirus G!/G2 Negative   Rotavirus A by PCR Negative   G lamblia by PCR Negative     Cryptosporidium by PCR Negative       Today   Subjective:   Regina Bundicktoday has no headache,no chest abdominal pain,no new weakness tingling or numbness, feels much better wants to go home today.    Objective:   Blood pressure 120/63, pulse 84, temperature 98.2 F (36.8 C), temperature source Oral, resp. rate 18, height 5' (1.524 m), weight 65 kg (143 lb 4.8 oz), SpO2 99 %.  Intake/Output Summary (Last 24 hours) at 07/30/14 1132 Last data filed at 07/30/14 0900  Gross per 24 hour  Intake   2450 ml  Output      0 ml  Net   2450 ml    Exam Awake Alert, Oriented x 3, No new F.N deficits, Normal affect [place].AT,PERRAL Supple Neck,No JVD, No cervical lymphadenopathy appriciated.  Symmetrical Chest wall movement, Good air movement bilaterally, CTAB RRR,No Gallops,Rubs or new Murmurs, No Parasternal Heave +ve B.Sounds, Abd Soft, Non tender, No organomegaly appriciated, No rebound -guarding or rigidity. No Cyanosis, Clubbing or edema, No new Rash or bruise  Data Review   CBC w Diff:  Lab Results  Component Value Date   WBC 8.7 07/30/2014   HGB 8.5* 07/30/2014   HCT 26.9* 07/30/2014   PLT 261 07/30/2014   LYMPHOPCT 27 07/30/2014   MONOPCT 8 07/30/2014   EOSPCT 1 07/30/2014   BASOPCT 0 07/30/2014    CMP:  Lab Results  Component Value Date   NA 138 07/30/2014   K 3.5* 07/30/2014   CL 105 07/30/2014   CO2 20 07/30/2014   BUN 14 07/30/2014   CREATININE 1.76* 07/30/2014   CREATININE 1.58* 03/13/2013   PROT 5.0* 07/30/2014   ALBUMIN 2.2* 07/30/2014   BILITOT 0.2* 07/30/2014   ALKPHOS 58 07/30/2014   AST 12 07/30/2014   ALT 6 07/30/2014  . Lab Results  Component Value Date   HGBA1C 8.0* 06/13/2012    CBG (last 3)   Recent Labs  07/29/14 1628 07/29/14 2059 07/30/14 0835  GLUCAP 214* 108* 112*       Total Time in preparing paper work, data evaluation and todays exam - 35 minutes  Thurnell Lose M.D on 07/30/2014 at 11:32 AM  [facility] Office  [phone]

## 2014-07-30 NOTE — Progress Notes (Signed)
Patient Discharge: Disposition: Patient discharged home. Education: She and health care POA was educated on medications, prescriptions, insulin, diet, and discharge instructions. IV: PICC line removed by the IV team. Telemetry: Discontinued before discharge. Transportation: Patient transported in w/c with the staff Belongings: Patient took all her belongings with her.

## 2014-07-30 NOTE — Plan of Care (Signed)
Problem: Consults Goal: General Medical Patient Education See Patient Education Module for specific education.  Outcome: Completed/Met Date Met:  07/30/14 Goal: Skin Care Protocol Initiated - if Braden Score 18 or less If consults are not indicated, leave blank or document N/A  Outcome: Not Applicable Date Met:  21/79/81 Goal: Nutrition Consult-if indicated Outcome: Not Applicable Date Met:  02/54/86 Goal: Diabetes Guidelines if Diabetic/Glucose > 140 If diabetic or lab glucose is > 140 mg/dl - Initiate Diabetes/Hyperglycemia Guidelines & Document Interventions  Outcome: Completed/Met Date Met:  07/30/14  Problem: Phase II Progression Outcomes Goal: Discharge plan established Outcome: Completed/Met Date Met:  07/30/14 Goal: Vital signs remain stable Outcome: Completed/Met Date Met:  07/30/14 Goal: IV changed to normal saline lock Outcome: Not Applicable Date Met:  28/24/17 Goal: Other Phase II Outcomes/Goals Outcome: Not Applicable Date Met:  53/01/04  Problem: Phase III Progression Outcomes Goal: Pain controlled on oral analgesia Outcome: Completed/Met Date Met:  07/30/14 Goal: Activity at appropriate level-compared to baseline (UP IN CHAIR FOR HEMODIALYSIS)  Outcome: Completed/Met Date Met:  07/30/14 Goal: Voiding independently Outcome: Completed/Met Date Met:  07/30/14 Goal: IV/normal saline lock discontinued Outcome: Not Applicable Date Met:  04/59/13 Goal: Foley discontinued Outcome: Not Applicable Date Met:  68/59/92 Goal: Discharge plan remains appropriate-arrangements made Outcome: Completed/Met Date Met:  07/30/14 Goal: Other Phase III Outcomes/Goals Outcome: Not Applicable Date Met:  34/14/43

## 2014-07-30 NOTE — Discharge Instructions (Signed)
Follow with Primary MD Nolene Ebbs A, MD in 2-3 days   Get CBC, CMP, 2 view Chest X ray checked  by Primary MD next visit.    Activity: As tolerated with Full fall precautions use walker/cane & assistance as needed   Disposition Home     Diet: Diabetic Low Carb diet.  Accuchecks 4 times/day, Once in AM empty stomach and then before each meal. Log in all results and show them to your Prim.MD in 3 days. If any glucose reading is under 80 or above 300 call your Prim MD immidiately. Follow Low glucose instructions for glucose under 80 as instructed.    For Heart failure patients - Check your Weight same time everyday, if you gain over 2 pounds, or you develop in leg swelling, experience more shortness of breath or chest pain, call your Primary MD immediately. Follow Cardiac Low Salt Diet and 1.8 lit/day fluid restriction.   On your next visit with your primary care physician please Get Medicines reviewed and adjusted.   Please request your Prim.MD to go over all Hospital Tests and Procedure/Radiological results at the follow up, please get all Hospital records sent to your Prim MD by signing hospital release before you go home.   If you experience worsening of your admission symptoms, develop shortness of breath, life threatening emergency, suicidal or homicidal thoughts you must seek medical attention immediately by calling 911 or calling your MD immediately  if symptoms less severe.  You Must read complete instructions/literature along with all the possible adverse reactions/side effects for all the Medicines you take and that have been prescribed to you. Take any new Medicines after you have completely understood and accpet all the possible adverse reactions/side effects.   Do not drive, operating heavy machinery, perform activities at heights, swimming or participation in water activities or provide baby sitting services if your were admitted for syncope or siezures until you have  seen by Primary MD or a Neurologist and advised to do so again.  Do not drive when taking Pain medications.    Do not take more than prescribed Pain, Sleep and Anxiety Medications  Special Instructions: If you have smoked or chewed Tobacco  in the last 2 yrs please stop smoking, stop any regular Alcohol  and or any Recreational drug use.  Wear Seat belts while driving.   Please note  You were cared for by a hospitalist during your hospital stay. If you have any questions about your discharge medications or the care you received while you were in the hospital after you are discharged, you can call the unit and asked to speak with the hospitalist on call if the hospitalist that took care of you is not available. Once you are discharged, your primary care physician will handle any further medical issues. Please note that NO REFILLS for any discharge medications will be authorized once you are discharged, as it is imperative that you return to your primary care physician (or establish a relationship with a primary care physician if you do not have one) for your aftercare needs so that they can reassess your need for medications and monitor your lab values.

## 2014-07-30 NOTE — Plan of Care (Signed)
Problem: Phase I Progression Outcomes Goal: Other Phase I Outcomes/Goals Outcome: Not Applicable Date Met:  07/30/14     

## 2014-07-30 NOTE — Progress Notes (Signed)
Inpatient Diabetes Program Recommendations  AACE/ADA: New Consensus Statement on Inpatient Glycemic Control (2013)  Target Ranges:  Prepandial:   less than 140 mg/dL      Peak postprandial:   less than 180 mg/dL (1-2 hours)       Critically ill patients:  140 - 180 mg/dL   Reason for visit: Consult for "DM and insulin teaching". This coordinator met with patient to discuss insulin use at home.  Pt was taking Lantus (SoloStar pen) prior to admission.  Pt reports concern that she was given Levemir at rehab facility and was then discharged with orders to take Levemir.  She did not understand why they switched her from Lantus to Levemir.  She was unsure what Levemir was and went back to taking her Lantus when she got home.  Pt was adamant that she takes only Lantus at home and does not know why her med reconciliation says Levemir and not Lantus.  This coordinator called Dr. Candiss Norse and asked for discharge order to be Lantus instead of Levemir.  Order was changed.  Pt is NEW to NOVOLOG TID.  We discussed timing and administration of the new insulin.   Pt is anxious that she will not be able to do it.  She already knows how to give insulin using the pen.  Pt has a good support system and reports that "two nurses live across the hall" from her.  Bedside RN will reinforce teaching when patient's friend/family is present to take her home.  Encouraged patient to call her PCP if she has difficulty with a 2 insulin regimen.  Pt is monitoring CBGs TIDHS at home. No further questions/concerns at the end of our visit.   Thank you  Raoul Pitch BSN, RN,CDE Inpatient Diabetes Coordinator [phone] (team pager)

## 2014-07-30 NOTE — Care Management Note (Signed)
CARE MANAGEMENT NOTE 07/30/2014  Patient:  [name], [name]   Account Number:  [id number]  Date Initiated:  07/28/2014  Documentation initiated by:  Elissa Hefty  Subjective/Objective Assessment:   adm w abd pain, inc wbc     Action/Plan:   lives alone, pcp dr Jeanie Cooks  07/30/2014 Pt active with [facility] for [facility], disease management. Order placed to resume services.   Anticipated DC Date:  07/30/2014   Anticipated DC Plan:  [place]         Choice offered to / List presented to:          [employer] arranged  HH-1 RN      [place]   Status of service:  Completed, signed off Medicare Important Message given?  YES (If response is "NO", the following Medicare IM given date fields will be blank) Date Medicare IM given:  07/29/2014 Medicare IM given by:  Merrick Maggio Date Additional Medicare IM given:   Additional Medicare IM given by:    Discharge Disposition:  [place]  Per UR Regulation:  Reviewed for med. necessity/level of care/duration of stay  If discussed at [place] of Stay Meetings, dates discussed:    Comments:

## 2014-08-01 ENCOUNTER — Other Ambulatory Visit: Payer: Self-pay | Admitting: Cardiology

## 2014-08-01 NOTE — Telephone Encounter (Signed)
Allopurinol refused - defer to PCP

## 2014-08-27 ENCOUNTER — Encounter: Payer: Self-pay | Admitting: Cardiovascular Disease

## 2014-08-27 ENCOUNTER — Ambulatory Visit (INDEPENDENT_AMBULATORY_CARE_PROVIDER_SITE_OTHER): Payer: PRIVATE HEALTH INSURANCE | Admitting: Cardiovascular Disease

## 2014-08-27 VITALS — BP 122/70 | HR 83 | Ht 60.0 in | Wt 138.8 lb

## 2014-08-27 DIAGNOSIS — I739 Peripheral vascular disease, unspecified: Secondary | ICD-10-CM

## 2014-08-27 DIAGNOSIS — I10 Essential (primary) hypertension: Secondary | ICD-10-CM | POA: Insufficient documentation

## 2014-08-27 NOTE — Assessment & Plan Note (Signed)
History of hypertension with blood pressure measured today 120/70. She is on Catapres, carvedilol. Continue current meds at current dosing

## 2014-08-27 NOTE — Progress Notes (Signed)
08/27/2014 [name]   June 22, 1946  [id number]  Primary Physician Philis Fendt, MD Primary Cardiologist: [name] MD Renae Gloss   HPI:  The patient is a 68 year-old mildly overweight single [place] American female with no children, who I last saw 12 months ago. She has a history of remote alcohol and tobacco abuse, having quit 6 to 7 years ago. She smoked 50 pack years prior to that and does have COPD. She had PVOD, status post bilateral iliac artery PTA and stenting back in 2009 with a known short-segment occlusion from mid right SFA. She does get some calf claudication, but denies chest pain or shortness of breath. She does have chronic renal insufficiency and chronic pancreatitis as well as alcoholic hepatitis and type 2 diabetes. Her last Myoview, performed March 18, 2011, was not ischemic. Her most recent arterial Dopplers, performed April 16, 2012, revealed ABIs of 0.77 bilaterally with patent iliac stents and short-segment occlusion, mid right SFA, unchanged from prior Doppler study. She  saw Regina Ransom, PA-C in the office a week ago with similar complaints. She denies chest pain or shortness of breath.she saw [name] registered nurse practitioner in the office after that with left knee pain. Venous Dopplers did not show evidence of DVT or Baker's cyst. Her last lipid profile performed 03/04/14 revealed a total cholesterol 150, LDL of 66 and HDL of 30.   Current Outpatient Prescriptions  Medication Sig Dispense Refill  . albuterol (PROVENTIL HFA;VENTOLIN HFA) 108 (90 BASE) MCG/ACT inhaler Inhale 2 puffs into the lungs every 6 (six) hours as needed. For shortness of breath    . albuterol (PROVENTIL) (2.5 MG/3ML) 0.083% nebulizer solution Take 2.5 mg by nebulization every 6 (six) hours as needed for wheezing or shortness of breath.    . allopurinol (ZYLOPRIM) 100 MG tablet Take 100 mg by mouth 2 (two) times daily.    Marland Kitchen aspirin 81 MG EC tablet Take 81 mg by  mouth daily.      . carvedilol (COREG) 6.25 MG tablet Take 1 tablet (6.25 mg total) by mouth 2 (two) times daily with a meal.    . cilostazol (PLETAL) 100 MG tablet Take 50 mg by mouth 2 (two) times daily before a meal.     . cloNIDine (CATAPRES) 0.1 MG tablet Take 1 tablet (0.1 mg total) by mouth 3 (three) times daily.    . ergocalciferol (VITAMIN D2) 50000 UNITS capsule Take 50,000 Units by mouth once a week. On Monday    . fluticasone (FLONASE) 50 MCG/ACT nasal spray Place 2 sprays into the nose daily. 50 mcg /act 2 sprays nasal once a day     . glucose monitoring kit (FREESTYLE) monitoring kit 1 each by Does not apply route 4 (four) times daily - after meals and at bedtime. 1 month Diabetic Testing Supplies for QAC-QHS accuchecks.Any brand OK 1 each 1  . insulin aspart (NOVOLOG) 100 UNIT/ML injection Before each meal 3 times a day, 140-199 - 2 units, 200-250 - 4 units, 251-299 - 6 units,  300-349 - 8 units,  350 or above 10 units. Insulin PEN if approved, provide syringes and needles if needed. 10 mL 11  . insulin glargine (LANTUS) 100 UNIT/ML injection Inject 0.08 mLs (8 Units total) into the skin at bedtime. Pen if approved 10 mL 11  . Insulin Syringe-Needle U-100 25G X 1" 1 ML MISC Any brand, for 4 times a day insulin SQ, 1 month supply. 30 each 0  .  metoprolol (LOPRESSOR) 50 MG tablet Take by mouth 2 (two) times daily. Take 0.5 (half) of a tablet, twice a day    . montelukast (SINGULAIR) 10 MG tablet Take 10 mg by mouth at bedtime.     Marland Kitchen omeprazole (PRILOSEC) 40 MG capsule Take 40 mg by mouth daily.     [name] IN Inhale into the lungs. 2 liters nightly    . Polyethylene Glycol 400 (BLINK TEARS OP) Place 1 drop into the right eye daily as needed (dryness).      No current facility-administered medications for this visit.    Allergies  Allergen Reactions  . Penicillins Hives  . Lipitor [Atorvastatin] Swelling    History   Social History  . Marital Status: Single    Spouse  Name: N/A    Number of Children: N/A  . Years of Education: N/A   Occupational History  . Not on file.   Social History Main Topics  . Smoking status: Former Smoker -- 0.50 packs/day for 30 years    Types: Cigarettes    Quit date: 11/06/2005  . Smokeless tobacco: Never Used  . Alcohol Use: No     Comment: stopped 2005  . Drug Use: No  . Sexual Activity: Not on file   Other Topics Concern  . Not on file   Social History Narrative     Review of Systems: General: negative for chills, fever, night sweats or weight changes.  Cardiovascular: negative for chest pain, dyspnea on exertion, edema, orthopnea, palpitations, paroxysmal nocturnal dyspnea or shortness of breath Dermatological: negative for rash Respiratory: negative for cough or wheezing Urologic: negative for hematuria Abdominal: negative for nausea, vomiting, diarrhea, bright red blood per rectum, melena, or hematemesis Neurologic: negative for visual changes, syncope, or dizziness All other systems reviewed and are otherwise negative except as noted above.    Blood pressure 122/70, pulse 83, height 5' (1.524 m), weight 138 lb 12.8 oz (62.959 kg).  General appearance: alert and no distress Neck: no adenopathy, no carotid bruit, no JVD, supple, symmetrical, trachea midline and thyroid not enlarged, symmetric, no tenderness/mass/nodules Lungs: clear to auscultation bilaterally Heart: regular rate and rhythm, S1, S2 normal, no murmur, click, rub or gallop Extremities: 2+ edema left, 1+ edema right  EKG normal sinus rhythm 83 without ST or T-wave changes. I personally reviewed this EKG  ASSESSMENT AND PLAN:   Claudication History of peripheral arterial disease status post bilateral iliac stenting by Dr. Nadyne Coombes 03/10/08 with a known occluded mid right SFA. Her most recent Dopplers performed 10/04/13 revealed patent iliac stents with an occluded right SFA. She denies claudication.  Essential hypertension History of  hypertension with blood pressure measured today 120/70. She is on Catapres, carvedilol. Continue current meds at current dosing      [name] MD [facility], [facility] 08/27/2014 4:24 PM

## 2014-08-27 NOTE — Patient Instructions (Signed)
Your physician wants you to follow-up in 1 year with Dr. Berry. You will receive a reminder letter in the mail 2 months in advance. If you do not receive a letter, please call our office to schedule the follow-up appointment.  

## 2014-08-27 NOTE — Assessment & Plan Note (Signed)
History of peripheral arterial disease status post bilateral iliac stenting by Dr. Nadyne Coombes 03/10/08 with a known occluded mid right SFA. Her most recent Dopplers performed 10/04/13 revealed patent iliac stents with an occluded right SFA. She denies claudication.

## 2014-09-04 ENCOUNTER — Telehealth: Payer: Self-pay | Admitting: Cardiovascular Disease

## 2014-09-04 NOTE — Telephone Encounter (Signed)
Please call,pt have question about her Lopressor.

## 2014-09-04 NOTE — Telephone Encounter (Signed)
Spoke to patient  patient wanted to know if she should be taking lopressor as well as Carvedilol.and Clonidine Her pharmacy is inquiring. RN reviewed patient last office visit- patient should be taking Carvedilol and Clonidine along with other medications Patient verbalized understanding.

## 2014-09-05 ENCOUNTER — Telehealth: Payer: Self-pay | Admitting: Cardiovascular Disease

## 2014-09-05 DIAGNOSIS — N183 Chronic kidney disease, stage 3 (moderate): Secondary | ICD-10-CM | POA: Diagnosis not present

## 2014-09-05 DIAGNOSIS — F329 Major depressive disorder, single episode, unspecified: Secondary | ICD-10-CM | POA: Diagnosis not present

## 2014-09-05 DIAGNOSIS — I5032 Chronic diastolic (congestive) heart failure: Secondary | ICD-10-CM | POA: Diagnosis not present

## 2014-09-05 DIAGNOSIS — E1122 Type 2 diabetes mellitus with diabetic chronic kidney disease: Secondary | ICD-10-CM | POA: Diagnosis not present

## 2014-09-05 DIAGNOSIS — I739 Peripheral vascular disease, unspecified: Secondary | ICD-10-CM | POA: Diagnosis not present

## 2014-09-05 DIAGNOSIS — J45909 Unspecified asthma, uncomplicated: Secondary | ICD-10-CM | POA: Diagnosis not present

## 2014-09-05 DIAGNOSIS — D649 Anemia, unspecified: Secondary | ICD-10-CM | POA: Diagnosis not present

## 2014-09-05 DIAGNOSIS — I129 Hypertensive chronic kidney disease with stage 1 through stage 4 chronic kidney disease, or unspecified chronic kidney disease: Secondary | ICD-10-CM | POA: Diagnosis not present

## 2014-09-05 DIAGNOSIS — J449 Chronic obstructive pulmonary disease, unspecified: Secondary | ICD-10-CM | POA: Diagnosis not present

## 2014-09-05 DIAGNOSIS — K7030 Alcoholic cirrhosis of liver without ascites: Secondary | ICD-10-CM | POA: Diagnosis not present

## 2014-09-05 MED ORDER — CLONIDINE HCL 0.1 MG PO TABS: 0.1000 mg | ORAL_TABLET | Freq: Three times a day (TID) | ORAL | Status: DC

## 2014-09-05 MED ORDER — CARVEDILOL 6.25 MG PO TABS: 6.2500 mg | ORAL_TABLET | Freq: Two times a day (BID) | ORAL | Status: DC

## 2014-09-05 NOTE — Telephone Encounter (Signed)
Refill submitted to patient's preferred pharmacy. Informed patient. Pt voiced understanding, no other stated concerns at this time.  

## 2014-09-05 NOTE — Telephone Encounter (Signed)
Pt need new prescriptions for her Clonidine and Carvedilol and refills #90. Please call to Physicians [phone].Please call these in today if possible.

## 2014-09-12 DIAGNOSIS — E1122 Type 2 diabetes mellitus with diabetic chronic kidney disease: Secondary | ICD-10-CM | POA: Diagnosis not present

## 2014-09-12 DIAGNOSIS — K7030 Alcoholic cirrhosis of liver without ascites: Secondary | ICD-10-CM | POA: Diagnosis not present

## 2014-09-12 DIAGNOSIS — I5032 Chronic diastolic (congestive) heart failure: Secondary | ICD-10-CM | POA: Diagnosis not present

## 2014-09-12 DIAGNOSIS — I739 Peripheral vascular disease, unspecified: Secondary | ICD-10-CM | POA: Diagnosis not present

## 2014-09-12 DIAGNOSIS — D649 Anemia, unspecified: Secondary | ICD-10-CM | POA: Diagnosis not present

## 2014-09-12 DIAGNOSIS — J45909 Unspecified asthma, uncomplicated: Secondary | ICD-10-CM | POA: Diagnosis not present

## 2014-09-12 DIAGNOSIS — J449 Chronic obstructive pulmonary disease, unspecified: Secondary | ICD-10-CM | POA: Diagnosis not present

## 2014-09-12 DIAGNOSIS — I129 Hypertensive chronic kidney disease with stage 1 through stage 4 chronic kidney disease, or unspecified chronic kidney disease: Secondary | ICD-10-CM | POA: Diagnosis not present

## 2014-09-12 DIAGNOSIS — F329 Major depressive disorder, single episode, unspecified: Secondary | ICD-10-CM | POA: Diagnosis not present

## 2014-09-12 DIAGNOSIS — N183 Chronic kidney disease, stage 3 (moderate): Secondary | ICD-10-CM | POA: Diagnosis not present

## 2014-09-22 DIAGNOSIS — J449 Chronic obstructive pulmonary disease, unspecified: Secondary | ICD-10-CM | POA: Diagnosis not present

## 2014-09-29 DIAGNOSIS — J449 Chronic obstructive pulmonary disease, unspecified: Secondary | ICD-10-CM | POA: Diagnosis not present

## 2014-10-03 ENCOUNTER — Other Ambulatory Visit (HOSPITAL_COMMUNITY): Payer: Self-pay | Admitting: Cardiovascular Disease

## 2014-10-03 DIAGNOSIS — I739 Peripheral vascular disease, unspecified: Secondary | ICD-10-CM

## 2014-10-16 ENCOUNTER — Ambulatory Visit (HOSPITAL_COMMUNITY)
Admission: RE | Admit: 2014-10-16 | Discharge: 2014-10-16 | Disposition: A | Discharge status: Home / Self Care | Payer: Medicare Other | Source: Ambulatory Visit | Attending: Cardiology | Admitting: Cardiology

## 2014-10-16 DIAGNOSIS — I739 Peripheral vascular disease, unspecified: Secondary | ICD-10-CM | POA: Diagnosis not present

## 2014-10-16 NOTE — Progress Notes (Signed)
Arterial Duplex Lower Ext. Completed. Diago Haik, BS, RDMS, RVT  

## 2014-10-17 DIAGNOSIS — E1121 Type 2 diabetes mellitus with diabetic nephropathy: Secondary | ICD-10-CM | POA: Diagnosis not present

## 2014-10-17 DIAGNOSIS — K219 Gastro-esophageal reflux disease without esophagitis: Secondary | ICD-10-CM | POA: Diagnosis not present

## 2014-10-17 DIAGNOSIS — I739 Peripheral vascular disease, unspecified: Secondary | ICD-10-CM | POA: Diagnosis not present

## 2014-10-20 DIAGNOSIS — E119 Type 2 diabetes mellitus without complications: Secondary | ICD-10-CM | POA: Diagnosis not present

## 2014-10-21 DIAGNOSIS — E119 Type 2 diabetes mellitus without complications: Secondary | ICD-10-CM | POA: Diagnosis not present

## 2014-10-22 DIAGNOSIS — E119 Type 2 diabetes mellitus without complications: Secondary | ICD-10-CM | POA: Diagnosis not present

## 2014-10-23 DIAGNOSIS — E119 Type 2 diabetes mellitus without complications: Secondary | ICD-10-CM | POA: Diagnosis not present

## 2014-10-23 DIAGNOSIS — J449 Chronic obstructive pulmonary disease, unspecified: Secondary | ICD-10-CM | POA: Diagnosis not present

## 2014-10-24 DIAGNOSIS — E119 Type 2 diabetes mellitus without complications: Secondary | ICD-10-CM | POA: Diagnosis not present

## 2014-10-27 DIAGNOSIS — E119 Type 2 diabetes mellitus without complications: Secondary | ICD-10-CM | POA: Diagnosis not present

## 2014-10-28 DIAGNOSIS — E119 Type 2 diabetes mellitus without complications: Secondary | ICD-10-CM | POA: Diagnosis not present

## 2014-10-29 DIAGNOSIS — E119 Type 2 diabetes mellitus without complications: Secondary | ICD-10-CM | POA: Diagnosis not present

## 2014-10-30 DIAGNOSIS — E119 Type 2 diabetes mellitus without complications: Secondary | ICD-10-CM | POA: Diagnosis not present

## 2014-10-31 DIAGNOSIS — E119 Type 2 diabetes mellitus without complications: Secondary | ICD-10-CM | POA: Diagnosis not present

## 2014-11-03 ENCOUNTER — Telehealth: Payer: Self-pay | Admitting: Cardiovascular Disease

## 2014-11-03 DIAGNOSIS — R600 Localized edema: Secondary | ICD-10-CM | POA: Diagnosis not present

## 2014-11-03 DIAGNOSIS — E119 Type 2 diabetes mellitus without complications: Secondary | ICD-10-CM | POA: Diagnosis not present

## 2014-11-03 DIAGNOSIS — J449 Chronic obstructive pulmonary disease, unspecified: Secondary | ICD-10-CM | POA: Diagnosis not present

## 2014-11-03 DIAGNOSIS — E1121 Type 2 diabetes mellitus with diabetic nephropathy: Secondary | ICD-10-CM | POA: Diagnosis not present

## 2014-11-03 DIAGNOSIS — J302 Other seasonal allergic rhinitis: Secondary | ICD-10-CM | POA: Diagnosis not present

## 2014-11-03 DIAGNOSIS — Z13220 Encounter for screening for lipoid disorders: Secondary | ICD-10-CM | POA: Diagnosis not present

## 2014-11-03 DIAGNOSIS — K7460 Unspecified cirrhosis of liver: Secondary | ICD-10-CM | POA: Diagnosis not present

## 2014-11-03 DIAGNOSIS — Z23 Encounter for immunization: Secondary | ICD-10-CM | POA: Diagnosis not present

## 2014-11-03 DIAGNOSIS — M79606 Pain in leg, unspecified: Secondary | ICD-10-CM

## 2014-11-03 NOTE — Telephone Encounter (Signed)
Returned call to patient. She states her legs are swollen, tight, hot. She states they hurt and she "doesn't want to keep taking pain pills". Patient states her legs have been "hurting quite some time". Patient states her legs are changing color and that her legs and feet are red. She states her left leg is hot and there is a lump in back at the bend. She states her legs hurt all the time - at rest, walking - she has to prop them up at night. She states her fingers leaves indentions in boht legs.   She states she used to take furosemide and this "used to do good" for her legs.  Furosemide 40mg  once daily was stopped 10/22 at hospital discharge.  Patient states her weights are coming down - 127lbs today.   Patient has no other complaints  Will defer to Dr. Gwenlyn Found to review and advise on patient's symptoms.   Spoke to Dr. Gwenlyn Found - concerns of DVT given symptoms. He advised that patient have a bilateral LE venous duplex 11/04/14 and follow up with him 3/8  Patient will see Dr. Gwenlyn Found 3/8 @ 2:45pm Message given to [facility] to contact patient for doppler study

## 2014-11-03 NOTE — Telephone Encounter (Signed)
Please call,legs are swollen and hurting.

## 2014-11-04 ENCOUNTER — Encounter: Payer: Self-pay | Admitting: Cardiovascular Disease

## 2014-11-04 ENCOUNTER — Ambulatory Visit (HOSPITAL_COMMUNITY)
Admission: RE | Admit: 2014-11-04 | Discharge: 2014-11-04 | Disposition: A | Discharge status: Home / Self Care | Payer: Medicare Other | Source: Ambulatory Visit | Attending: Cardiology | Admitting: Cardiology

## 2014-11-04 ENCOUNTER — Ambulatory Visit (INDEPENDENT_AMBULATORY_CARE_PROVIDER_SITE_OTHER): Payer: Medicare Other | Admitting: Cardiovascular Disease

## 2014-11-04 VITALS — BP 126/82 | HR 88 | Ht 60.0 in | Wt 127.0 lb

## 2014-11-04 DIAGNOSIS — E119 Type 2 diabetes mellitus without complications: Secondary | ICD-10-CM | POA: Diagnosis not present

## 2014-11-04 DIAGNOSIS — Z79899 Other long term (current) drug therapy: Secondary | ICD-10-CM

## 2014-11-04 DIAGNOSIS — R600 Localized edema: Secondary | ICD-10-CM | POA: Diagnosis not present

## 2014-11-04 DIAGNOSIS — I739 Peripheral vascular disease, unspecified: Secondary | ICD-10-CM | POA: Diagnosis not present

## 2014-11-04 DIAGNOSIS — M79605 Pain in left leg: Secondary | ICD-10-CM | POA: Insufficient documentation

## 2014-11-04 DIAGNOSIS — L0390 Cellulitis, unspecified: Secondary | ICD-10-CM | POA: Insufficient documentation

## 2014-11-04 DIAGNOSIS — I10 Essential (primary) hypertension: Secondary | ICD-10-CM

## 2014-11-04 DIAGNOSIS — M79606 Pain in leg, unspecified: Secondary | ICD-10-CM

## 2014-11-04 DIAGNOSIS — R609 Edema, unspecified: Secondary | ICD-10-CM | POA: Diagnosis not present

## 2014-11-04 DIAGNOSIS — M79604 Pain in right leg: Secondary | ICD-10-CM | POA: Insufficient documentation

## 2014-11-04 MED ORDER — CEPHALEXIN 500 MG PO CAPS: 500.0000 mg | ORAL_CAPSULE | Freq: Three times a day (TID) | ORAL | Status: DC

## 2014-11-04 NOTE — Patient Instructions (Signed)
  We will see you back in follow up in 3 weeks with an extender and 3 months with Dr Gwenlyn Found.   Dr Gwenlyn Found has ordered: 1. Have blood work done today or in the next couple of days.  2. Start Keflex 500mg  one tablet three times daily until finished. (10 days)

## 2014-11-04 NOTE — Assessment & Plan Note (Signed)
History of hypertension blood pressure measured at 126/82. She is on clonidine and carvedilol. Continue current meds at current dosing

## 2014-11-04 NOTE — Assessment & Plan Note (Signed)
The patient comes in today with complaints of bilateral lower extremity cellulitis. Legs aren't tender to palpation, discolored and mildly swollen. There is Doppler showed no evidence of DVT. She is penicillin allergic. I suspect she has bilateral lower extremity cellulitis. I'm going to begin her on Keflex 500 mg by mouth 3 times a day for 10 days and will have her seen back in the office in follow-up. We will check a BMET as well.

## 2014-11-04 NOTE — Telephone Encounter (Signed)
Doppler @ 130pm 3/8

## 2014-11-04 NOTE — Telephone Encounter (Signed)
I spoke with patient and notified her to be here at 1:30 for an ultrasound of her leg and then see Dr Gwenlyn Found at 2:45.  She verbalized understanding.

## 2014-11-04 NOTE — Progress Notes (Signed)
11/04/2014 [name]   [birth year]  [id number]  Primary Physician Regina Fendt, MD Primary Cardiologist: Regina Harp MD [name]    HPI: The patient is a 69 year-old mildly overweight single [place] American female with no children, who I last saw 2-3 months ago. She has a history of remote alcohol and tobacco abuse, having quit 6 to 7 years ago. She smoked 50 pack years prior to that and does have COPD. She had PVOD, status post bilateral iliac artery PTA and stenting back in 2009 with a known short-segment occlusion from mid right SFA. She does get some calf claudication, but denies chest pain or shortness of breath. She does have chronic renal insufficiency and chronic pancreatitis as well as alcoholic hepatitis and type 2 diabetes. Her last Myoview, performed March 18, 2011, was not ischemic. Her most recent arterial Dopplers, performed April 16, 2012, revealed ABIs of 0.77 bilaterally with patent iliac stents and short-segment occlusion, mid right SFA, unchanged from prior Doppler study. She saw Regina Ransom, PA-C in the office a week ago with similar complaints. She denies chest pain or shortness of breath.she saw [name] registered nurse practitioner in the office after that with left knee pain. Since I saw her in the office she complains of bilateral calf and leg pain some discoloration and edema. She had venous Dopplers today did not show any evidence of  DVT but did show a right Baker's cyst.  Current Outpatient Prescriptions  Medication Sig Dispense Refill  . albuterol (PROVENTIL HFA;VENTOLIN HFA) 108 (90 BASE) MCG/ACT inhaler Inhale 2 puffs into the lungs every 6 (six) hours as needed. For shortness of breath    . albuterol (PROVENTIL) (2.5 MG/3ML) 0.083% nebulizer solution Take 2.5 mg by nebulization every 6 (six) hours as needed for wheezing or shortness of breath.    . allopurinol (ZYLOPRIM) 100 MG tablet Take 100 mg by mouth 2 (two) times daily.     Marland Kitchen aspirin 81 MG EC tablet Take 81 mg by mouth daily.      . carvedilol (COREG) 6.25 MG tablet Take 1 tablet (6.25 mg total) by mouth 2 (two) times daily with a meal. 60 tablet 11  . cilostazol (PLETAL) 100 MG tablet Take 50 mg by mouth 2 (two) times daily before a meal.     . cloNIDine (CATAPRES) 0.1 MG tablet Take 1 tablet (0.1 mg total) by mouth 3 (three) times daily. 90 tablet 11  . ergocalciferol (VITAMIN D2) 50000 UNITS capsule Take 50,000 Units by mouth once a week. On Monday    . fluticasone (FLONASE) 50 MCG/ACT nasal spray Place 2 sprays into the nose daily. 50 mcg /act 2 sprays nasal once a day     . glucose monitoring kit (FREESTYLE) monitoring kit 1 each by Does not apply route 4 (four) times daily - after meals and at bedtime. 1 month Diabetic Testing Supplies for QAC-QHS accuchecks.Any brand OK 1 each 1  . insulin aspart (NOVOLOG) 100 UNIT/ML injection Before each meal 3 times a day, 140-199 - 2 units, 200-250 - 4 units, 251-299 - 6 units,  300-349 - 8 units,  350 or above 10 units. Insulin PEN if approved, provide syringes and needles if needed. 10 mL 11  . insulin glargine (LANTUS) 100 UNIT/ML injection Inject 0.08 mLs (8 Units total) into the skin at bedtime. Pen if approved 10 mL 11  . Insulin Syringe-Needle U-100 25G X 1" 1 ML MISC Any brand, for 4 times a  day insulin SQ, 1 month supply. 30 each 0  . montelukast (SINGULAIR) 10 MG tablet Take 10 mg by mouth at bedtime.     Marland Kitchen omeprazole (PRILOSEC) 40 MG capsule Take 40 mg by mouth daily.     [name] IN Inhale into the lungs. 2 liters nightly    . Polyethylene Glycol 400 (BLINK TEARS OP) Place 1 drop into the right eye daily as needed (dryness).     . cephALEXin (KEFLEX) 500 MG capsule Take 1 capsule (500 mg total) by mouth 3 (three) times daily. 30 capsule 0   No current facility-administered medications for this visit.    Allergies  Allergen Reactions  . Penicillins Hives  . Lipitor [Atorvastatin] Swelling     History   Social History  . Marital Status: Single    Spouse Name: N/A  . Number of Children: N/A  . Years of Education: N/A   Occupational History  . Not on file.   Social History Main Topics  . Smoking status: Former Smoker -- 0.50 packs/day for 30 years    Types: Cigarettes    Quit date: 11/06/2005  . Smokeless tobacco: Never Used  . Alcohol Use: No     Comment: stopped 2005  . Drug Use: No  . Sexual Activity: Not on file   Other Topics Concern  . Not on file   Social History Narrative     Review of Systems: General: negative for chills, fever, night sweats or weight changes.  Cardiovascular: negative for chest pain, dyspnea on exertion, edema, orthopnea, palpitations, paroxysmal nocturnal dyspnea or shortness of breath Dermatological: negative for rash Respiratory: negative for cough or wheezing Urologic: negative for hematuria Abdominal: negative for nausea, vomiting, diarrhea, bright red blood per rectum, melena, or hematemesis Neurologic: negative for visual changes, syncope, or dizziness All other systems reviewed and are otherwise negative except as noted above.    Blood pressure 126/82, pulse 88, height 5' (1.524 m), weight 127 lb (57.607 kg).  General appearance: alert and no distress Neck: no adenopathy, no carotid bruit, no JVD, supple, symmetrical, trachea midline and thyroid not enlarged, symmetric, no tenderness/mass/nodules Lungs: clear to auscultation bilaterally Heart: regular rate and rhythm, S1, S2 normal, no murmur, click, rub or gallop Extremities: 1-2+ edema, changes of cellulitis, tenderness to palpation  EKG not performed today  ASSESSMENT AND PLAN:   Cellulitis The patient comes in today with complaints of bilateral lower extremity cellulitis. Legs aren't tender to palpation, discolored and mildly swollen. There is Doppler showed no evidence of DVT. She is penicillin allergic. I suspect she has bilateral lower extremity cellulitis.  I'm going to begin her on Keflex 500 mg by mouth 3 times a day for 10 days and will have her seen back in the office in follow-up. We will check a BMET as well.   Essential hypertension History of hypertension blood pressure measured at 126/82. She is on clonidine and carvedilol. Continue current meds at current dosing   PVD, LEIA/LIIA and RCIA pta 7/09- ABIs 0.68 and 0.69 Feb 2014 History of peripheral arterial disease status post bilateral iliac stenting in the past back in 2009. Her most recent arterial Dopplers performed 10/16/14 revealed a left ABI 0.61 with tibial vessel disease and oriented 0.77 with an occluded right SFA. Her iliac stents appeared patent       Regina Harp MD [facility], [facility] 11/04/2014 3:30 PM

## 2014-11-04 NOTE — Assessment & Plan Note (Signed)
History of peripheral arterial disease status post bilateral iliac stenting in the past back in 2009. Her most recent arterial Dopplers performed 10/16/14 revealed a left ABI 0.61 with tibial vessel disease and oriented 0.77 with an occluded right SFA. Her iliac stents appeared patent

## 2014-11-04 NOTE — Progress Notes (Signed)
Bilateral Lower Extremity Venous Duplex Completed. [place]

## 2014-11-05 DIAGNOSIS — E119 Type 2 diabetes mellitus without complications: Secondary | ICD-10-CM | POA: Diagnosis not present

## 2014-11-06 DIAGNOSIS — E119 Type 2 diabetes mellitus without complications: Secondary | ICD-10-CM | POA: Diagnosis not present

## 2014-11-07 DIAGNOSIS — E119 Type 2 diabetes mellitus without complications: Secondary | ICD-10-CM | POA: Diagnosis not present

## 2014-11-10 DIAGNOSIS — E119 Type 2 diabetes mellitus without complications: Secondary | ICD-10-CM | POA: Diagnosis not present

## 2014-11-11 DIAGNOSIS — E119 Type 2 diabetes mellitus without complications: Secondary | ICD-10-CM | POA: Diagnosis not present

## 2014-11-12 ENCOUNTER — Other Ambulatory Visit: Payer: Self-pay

## 2014-11-12 DIAGNOSIS — E119 Type 2 diabetes mellitus without complications: Secondary | ICD-10-CM | POA: Diagnosis not present

## 2014-11-12 DIAGNOSIS — Z1231 Encounter for screening mammogram for malignant neoplasm of breast: Secondary | ICD-10-CM

## 2014-11-13 DIAGNOSIS — E119 Type 2 diabetes mellitus without complications: Secondary | ICD-10-CM | POA: Diagnosis not present

## 2014-11-14 DIAGNOSIS — E119 Type 2 diabetes mellitus without complications: Secondary | ICD-10-CM | POA: Diagnosis not present

## 2014-11-17 DIAGNOSIS — E119 Type 2 diabetes mellitus without complications: Secondary | ICD-10-CM | POA: Diagnosis not present

## 2014-11-18 DIAGNOSIS — E119 Type 2 diabetes mellitus without complications: Secondary | ICD-10-CM | POA: Diagnosis not present

## 2014-11-19 DIAGNOSIS — E119 Type 2 diabetes mellitus without complications: Secondary | ICD-10-CM | POA: Diagnosis not present

## 2014-11-20 DIAGNOSIS — E119 Type 2 diabetes mellitus without complications: Secondary | ICD-10-CM | POA: Diagnosis not present

## 2014-11-21 DIAGNOSIS — J449 Chronic obstructive pulmonary disease, unspecified: Secondary | ICD-10-CM | POA: Diagnosis not present

## 2014-11-24 ENCOUNTER — Telehealth: Payer: Self-pay | Admitting: Cardiovascular Disease

## 2014-11-24 DIAGNOSIS — E119 Type 2 diabetes mellitus without complications: Secondary | ICD-10-CM | POA: Diagnosis not present

## 2014-11-24 NOTE — Telephone Encounter (Signed)
°  1. Which medications need to be refilled? Metoprolol Tartrate  2. Which pharmacy is medication to be sent to?[facility]*  3. Do they need a 30 day or 90 day supply? 30 and refills 4. Would they like a call back once the medication has been sent to the pharmacy?

## 2014-11-24 NOTE — Telephone Encounter (Signed)
Spoke with [facility] tech - metoprolol is NOT on patient's med list. Was not on med list at last OV and was previously clarified with this pharmacy by another RN early in 2016. Rx for carvedilol was sent to this pharmacy in Jan 2016  They will update this Rx on file and contact patient.

## 2014-11-25 DIAGNOSIS — E119 Type 2 diabetes mellitus without complications: Secondary | ICD-10-CM | POA: Diagnosis not present

## 2014-11-26 DIAGNOSIS — E119 Type 2 diabetes mellitus without complications: Secondary | ICD-10-CM | POA: Diagnosis not present

## 2014-11-27 DIAGNOSIS — E119 Type 2 diabetes mellitus without complications: Secondary | ICD-10-CM | POA: Diagnosis not present

## 2014-11-28 DIAGNOSIS — E119 Type 2 diabetes mellitus without complications: Secondary | ICD-10-CM | POA: Diagnosis not present

## 2014-12-01 DIAGNOSIS — E119 Type 2 diabetes mellitus without complications: Secondary | ICD-10-CM | POA: Diagnosis not present

## 2014-12-02 DIAGNOSIS — E119 Type 2 diabetes mellitus without complications: Secondary | ICD-10-CM | POA: Diagnosis not present

## 2014-12-03 DIAGNOSIS — E119 Type 2 diabetes mellitus without complications: Secondary | ICD-10-CM | POA: Diagnosis not present

## 2014-12-04 DIAGNOSIS — E119 Type 2 diabetes mellitus without complications: Secondary | ICD-10-CM | POA: Diagnosis not present

## 2014-12-05 DIAGNOSIS — E119 Type 2 diabetes mellitus without complications: Secondary | ICD-10-CM | POA: Diagnosis not present

## 2014-12-08 ENCOUNTER — Ambulatory Visit: Payer: Medicare Other

## 2014-12-08 ENCOUNTER — Ambulatory Visit
Admission: RE | Admit: 2014-12-08 | Discharge: 2014-12-08 | Disposition: A | Discharge status: Home / Self Care | Payer: Medicare Other | Source: Ambulatory Visit

## 2014-12-08 DIAGNOSIS — Z1231 Encounter for screening mammogram for malignant neoplasm of breast: Secondary | ICD-10-CM | POA: Diagnosis not present

## 2014-12-08 DIAGNOSIS — E119 Type 2 diabetes mellitus without complications: Secondary | ICD-10-CM | POA: Diagnosis not present

## 2014-12-09 ENCOUNTER — Encounter: Payer: Self-pay | Admitting: Cardiology

## 2014-12-09 ENCOUNTER — Other Ambulatory Visit: Payer: Self-pay | Admitting: *Deleted

## 2014-12-09 ENCOUNTER — Ambulatory Visit (INDEPENDENT_AMBULATORY_CARE_PROVIDER_SITE_OTHER): Payer: Medicare Other | Admitting: Cardiology

## 2014-12-09 VITALS — BP 158/62 | HR 86 | Ht 60.0 in | Wt 126.1 lb

## 2014-12-09 DIAGNOSIS — E878 Other disorders of electrolyte and fluid balance, not elsewhere classified: Secondary | ICD-10-CM

## 2014-12-09 DIAGNOSIS — Z79899 Other long term (current) drug therapy: Secondary | ICD-10-CM

## 2014-12-09 DIAGNOSIS — E119 Type 2 diabetes mellitus without complications: Secondary | ICD-10-CM | POA: Diagnosis not present

## 2014-12-09 MED ORDER — CEPHALEXIN 500 MG PO CAPS: 500.0000 mg | ORAL_CAPSULE | Freq: Three times a day (TID) | ORAL | Status: DC

## 2014-12-09 NOTE — Progress Notes (Signed)
12/09/2014 [name]   [birth year]  [id number]  Primary Physician Philis Fendt, MD Primary Cardiologist: Dr. Gwenlyn Found  Reason For Visit/CC: F/U for Cellulitis  HPI:  The patient is a 69 y/o female, followed by Dr. Gwenlyn Found for PVD s/p remote post bilateral iliac artery PTA and stenting back in 2009. She also has a remote history of alcohol and tobacco abuse, having quit 6 to 7 years ago. She smoked 50 pack years prior to that and does have COPD. She does have chronic renal insufficiency and chronic pancreatitis as well as alcoholic hepatitis and type 2 diabetes. Her last Myoview, performed March 18, 2011, was not ischemic.  She was seen recently by Dr. Gwenlyn Found on 11/04/14 and complained bilateral calf/ leg pain and some discoloration and edema. Venous dopplers were ordered and showed no evidence of DVT but did show a right Baker's cyst.  It was suspected that she had bilateral lower extremity cellulitis. She was placed on Keflex 500 mg by mouth 3 times a day for 10 days.   She presents back for f/u. She notes significant improvement in appearance/ decreased swelling but she still has mild erythema and increased warmth. She denies fever and chills. No n/v/d. No CP or dyspnea.    Current Outpatient Prescriptions  Medication Sig Dispense Refill  . albuterol (PROVENTIL HFA;VENTOLIN HFA) 108 (90 BASE) MCG/ACT inhaler Inhale 2 puffs into the lungs every 6 (six) hours as needed. For shortness of breath    . albuterol (PROVENTIL) (2.5 MG/3ML) 0.083% nebulizer solution Take 2.5 mg by nebulization every 6 (six) hours as needed for wheezing or shortness of breath.    . allopurinol (ZYLOPRIM) 100 MG tablet Take 100 mg by mouth 2 (two) times daily.    Marland Kitchen aspirin 81 MG EC tablet Take 81 mg by mouth daily.      . carvedilol (COREG) 6.25 MG tablet Take 1 tablet (6.25 mg total) by mouth 2 (two) times daily with a meal. 60 tablet 11  . cilostazol (PLETAL) 100 MG tablet Take 50 mg by mouth 2 (two) times daily  before a meal.     . cloNIDine (CATAPRES) 0.1 MG tablet Take 1 tablet (0.1 mg total) by mouth 3 (three) times daily. 90 tablet 11  . ergocalciferol (VITAMIN D2) 50000 UNITS capsule Take 50,000 Units by mouth once a week. On Monday    . fluticasone (FLONASE) 50 MCG/ACT nasal spray Place 2 sprays into the nose daily. 50 mcg /act 2 sprays nasal once a day     . glucose monitoring kit (FREESTYLE) monitoring kit 1 each by Does not apply route 4 (four) times daily - after meals and at bedtime. 1 month Diabetic Testing Supplies for QAC-QHS accuchecks.Any brand OK 1 each 1  . insulin aspart (NOVOLOG) 100 UNIT/ML injection Before each meal 3 times a day, 140-199 - 2 units, 200-250 - 4 units, 251-299 - 6 units,  300-349 - 8 units,  350 or above 10 units. Insulin PEN if approved, provide syringes and needles if needed. 10 mL 11  . insulin glargine (LANTUS) 100 UNIT/ML injection Inject 0.08 mLs (8 Units total) into the skin at bedtime. Pen if approved 10 mL 11  . Insulin Syringe-Needle U-100 25G X 1" 1 ML MISC Any brand, for 4 times a day insulin SQ, 1 month supply. 30 each 0  . montelukast (SINGULAIR) 10 MG tablet Take 10 mg by mouth at bedtime.     Marland Kitchen omeprazole (PRILOSEC) 40 MG capsule Take 40  mg by mouth daily.     [name] IN Inhale into the lungs. 2 liters nightly    . Polyethylene Glycol 400 (BLINK TEARS OP) Place 1 drop into the right eye daily as needed (dryness).     . cephALEXin (KEFLEX) 500 MG capsule Take 1 capsule (500 mg total) by mouth 3 (three) times daily. (Patient not taking: Reported on 12/09/2014) 30 capsule 0   No current facility-administered medications for this visit.    Allergies  Allergen Reactions  . Penicillins Hives  . Lipitor [Atorvastatin] Swelling    History   Social History  . Marital Status: Single    Spouse Name: N/A  . Number of Children: N/A  . Years of Education: N/A   Occupational History  . Not on file.   Social History Main Topics  . Smoking  status: Former Smoker -- 0.50 packs/day for 30 years    Types: Cigarettes    Quit date: 11/06/2005  . Smokeless tobacco: Never Used  . Alcohol Use: No     Comment: stopped 2005  . Drug Use: No  . Sexual Activity: Not on file   Other Topics Concern  . Not on file   Social History Narrative     Review of Systems: General: negative for chills, fever, night sweats or weight changes.  Cardiovascular: negative for chest pain, dyspnea on exertion, edema, orthopnea, palpitations, paroxysmal nocturnal dyspnea or shortness of breath Dermatological: negative for rash Respiratory: negative for cough or wheezing Urologic: negative for hematuria Abdominal: negative for nausea, vomiting, diarrhea, bright red blood per rectum, melena, or hematemesis Neurologic: negative for visual changes, syncope, or dizziness All other systems reviewed and are otherwise negative except as noted above.    Blood pressure 158/62, pulse 86, height 5' (1.524 m), weight 126 lb 1.6 oz (57.199 kg).  General appearance: alert, cooperative and no distress Neck: no carotid bruit and no JVD Lungs: clear to auscultation bilaterally Heart: regular rate and rhythm, S1, S2 normal, no murmur, click, rub or gallop Extremities: mild anterior LEE erthyema and increased warmth bilaterally Pulses: 2+ and symmetric Skin: warm and dry Neurologic: Grossly normal  EKG Not performed  ASSESSMENT AND PLAN:   1. Bilateral LE Cellulitis: improved with initial course of Keflex but still with mild erythema and increased warmth bilaterally. No systemic symptoms. Will repeat 2nd round of Keflex, 500 mg TID x 10 days. F/U in 2 weeks.     [name] 12/09/2014 3:39 PM

## 2014-12-09 NOTE — Patient Instructions (Signed)
Your physician recommends that you schedule a follow-up appointment in: 2 weeks with Ellen Henri PA  Take keflex 500 mg three times  Day for 10 days  Have you lab test done as you leave today

## 2014-12-10 ENCOUNTER — Telehealth: Payer: Self-pay | Admitting: Cardiovascular Disease

## 2014-12-10 DIAGNOSIS — Z79899 Other long term (current) drug therapy: Secondary | ICD-10-CM

## 2014-12-10 DIAGNOSIS — E119 Type 2 diabetes mellitus without complications: Secondary | ICD-10-CM | POA: Diagnosis not present

## 2014-12-10 LAB — BASIC METABOLIC PANEL
BUN: 14 mg/dL (ref 6–23)
CO2: 26 mEq/L (ref 19–32)
Calcium: 9 mg/dL (ref 8.4–10.5)
Chloride: 101 mEq/L (ref 96–112)
Creat: 0.94 mg/dL (ref 0.50–1.10)
Glucose, Bld: 152 mg/dL — ABNORMAL HIGH (ref 70–99)
Potassium: 5.1 mEq/L (ref 3.5–5.3)
Sodium: 137 mEq/L (ref 135–145)

## 2014-12-10 MED ORDER — CEPHALEXIN 500 MG PO CAPS: 500.0000 mg | ORAL_CAPSULE | Freq: Three times a day (TID) | ORAL | Status: DC

## 2014-12-10 NOTE — Telephone Encounter (Signed)
°  1. Which medications need to be refilled? Cephalexin  2. Which pharmacy is medication to be sent to?[facility] on [address]  3. Do they need a 30 day or 90 day supply? 10 day supply ( 1 tab po tid)  4. Would they like a call back once the medication has been sent to the pharmacy? yes

## 2014-12-10 NOTE — Telephone Encounter (Signed)
Submitted to pt's preferred pharmacy.  Called pt to notify, phone rings w/o VM pickup.

## 2014-12-11 DIAGNOSIS — E119 Type 2 diabetes mellitus without complications: Secondary | ICD-10-CM | POA: Diagnosis not present

## 2014-12-12 DIAGNOSIS — E119 Type 2 diabetes mellitus without complications: Secondary | ICD-10-CM | POA: Diagnosis not present

## 2014-12-15 DIAGNOSIS — E119 Type 2 diabetes mellitus without complications: Secondary | ICD-10-CM | POA: Diagnosis not present

## 2014-12-16 DIAGNOSIS — E119 Type 2 diabetes mellitus without complications: Secondary | ICD-10-CM | POA: Diagnosis not present

## 2014-12-17 DIAGNOSIS — E119 Type 2 diabetes mellitus without complications: Secondary | ICD-10-CM | POA: Diagnosis not present

## 2014-12-18 DIAGNOSIS — E119 Type 2 diabetes mellitus without complications: Secondary | ICD-10-CM | POA: Diagnosis not present

## 2014-12-19 DIAGNOSIS — E119 Type 2 diabetes mellitus without complications: Secondary | ICD-10-CM | POA: Diagnosis not present

## 2014-12-22 DIAGNOSIS — J449 Chronic obstructive pulmonary disease, unspecified: Secondary | ICD-10-CM | POA: Diagnosis not present

## 2014-12-22 DIAGNOSIS — E119 Type 2 diabetes mellitus without complications: Secondary | ICD-10-CM | POA: Diagnosis not present

## 2014-12-23 DIAGNOSIS — E119 Type 2 diabetes mellitus without complications: Secondary | ICD-10-CM | POA: Diagnosis not present

## 2014-12-24 DIAGNOSIS — E119 Type 2 diabetes mellitus without complications: Secondary | ICD-10-CM | POA: Diagnosis not present

## 2014-12-25 DIAGNOSIS — E119 Type 2 diabetes mellitus without complications: Secondary | ICD-10-CM | POA: Diagnosis not present

## 2014-12-26 DIAGNOSIS — E119 Type 2 diabetes mellitus without complications: Secondary | ICD-10-CM | POA: Diagnosis not present

## 2014-12-29 DIAGNOSIS — E119 Type 2 diabetes mellitus without complications: Secondary | ICD-10-CM | POA: Diagnosis not present

## 2014-12-30 DIAGNOSIS — E119 Type 2 diabetes mellitus without complications: Secondary | ICD-10-CM | POA: Diagnosis not present

## 2014-12-31 DIAGNOSIS — E119 Type 2 diabetes mellitus without complications: Secondary | ICD-10-CM | POA: Diagnosis not present

## 2015-01-01 ENCOUNTER — Encounter: Payer: Self-pay | Admitting: Cardiology

## 2015-01-01 ENCOUNTER — Ambulatory Visit (INDEPENDENT_AMBULATORY_CARE_PROVIDER_SITE_OTHER): Payer: Medicare Other | Admitting: Cardiology

## 2015-01-01 VITALS — BP 142/78 | HR 78 | Ht 60.0 in | Wt 126.3 lb

## 2015-01-01 DIAGNOSIS — L02419 Cutaneous abscess of limb, unspecified: Secondary | ICD-10-CM

## 2015-01-01 DIAGNOSIS — L03119 Cellulitis of unspecified part of limb: Secondary | ICD-10-CM

## 2015-01-01 DIAGNOSIS — E119 Type 2 diabetes mellitus without complications: Secondary | ICD-10-CM | POA: Diagnosis not present

## 2015-01-01 NOTE — Progress Notes (Signed)
01/01/2015 [name]   Aug 09, 1946  [id number]  Primary Physician: Philis Fendt, MD Primary Cardiologist: Dr. Gwenlyn Found  Reason for Visit/CC: F/U for Cellulitis  HPI:  The patient is a 69 y/o female, followed by Dr. Gwenlyn Found for PVD s/p remote post bilateral iliac artery PTA and stenting back in 2009. She also has a remote history of alcohol and tobacco abuse, having quit 6 to 7 years ago. She smoked 50 pack years prior to that and does have COPD. She does have chronic renal insufficiency and chronic pancreatitis as well as alcoholic hepatitis and type 2 diabetes. Her last Myoview, performed March 18, 2011, was not ischemic.  She was seen recently by Dr. Gwenlyn Found on 11/04/14 and complained bilateral calf/ leg pain and some discoloration and edema. Venous dopplers were ordered and showed no evidence of DVT but did show a right Baker's cyst. It was suspected that she had bilateral lower extremity cellulitis. She was placed on Keflex 500 mg by mouth 3 times a day for 10 days.   She presented back for f/u on 12/09/14. She noted significant improvement in appearance/ decreased swelling but she still had mild erythema and increased warmth. She denied fever and chills. Given residual erythema and warmth, decision was made to repeat another round of Keflex.  She presents back again for f/u. She has had further improvement. Swelling has resolved. No increased warmth. She continues to have mild erythema on the distal anterior aspect of the right LEE. No signs of systemic infection, denying fever, chills n/v.     Current Outpatient Prescriptions  Medication Sig Dispense Refill  . albuterol (PROVENTIL HFA;VENTOLIN HFA) 108 (90 BASE) MCG/ACT inhaler Inhale 2 puffs into the lungs every 6 (six) hours as needed. For shortness of breath    . albuterol (PROVENTIL) (2.5 MG/3ML) 0.083% nebulizer solution Take 2.5 mg by nebulization every 6 (six) hours as needed for wheezing or shortness of breath.    .  allopurinol (ZYLOPRIM) 100 MG tablet Take 100 mg by mouth 2 (two) times daily.    Marland Kitchen aspirin 81 MG EC tablet Take 81 mg by mouth daily.      . carvedilol (COREG) 6.25 MG tablet Take 1 tablet (6.25 mg total) by mouth 2 (two) times daily with a meal. 60 tablet 11  . cilostazol (PLETAL) 100 MG tablet Take 50 mg by mouth 2 (two) times daily before a meal.     . cloNIDine (CATAPRES) 0.1 MG tablet Take 1 tablet (0.1 mg total) by mouth 3 (three) times daily. 90 tablet 11  . ergocalciferol (VITAMIN D2) 50000 UNITS capsule Take 50,000 Units by mouth once a week. On Monday    . fluticasone (FLONASE) 50 MCG/ACT nasal spray Place 2 sprays into the nose daily. 50 mcg /act 2 sprays nasal once a day     . glucose monitoring kit (FREESTYLE) monitoring kit 1 each by Does not apply route 4 (four) times daily - after meals and at bedtime. 1 month Diabetic Testing Supplies for QAC-QHS accuchecks.Any brand OK 1 each 1  . insulin aspart (NOVOLOG) 100 UNIT/ML injection Before each meal 3 times a day, 140-199 - 2 units, 200-250 - 4 units, 251-299 - 6 units,  300-349 - 8 units,  350 or above 10 units. Insulin PEN if approved, provide syringes and needles if needed. 10 mL 11  . insulin glargine (LANTUS) 100 UNIT/ML injection Inject 0.08 mLs (8 Units total) into the skin at bedtime. Pen if approved 10 mL 11  .  Insulin Syringe-Needle U-100 25G X 1" 1 ML MISC Any brand, for 4 times a day insulin SQ, 1 month supply. 30 each 0  . montelukast (SINGULAIR) 10 MG tablet Take 10 mg by mouth at bedtime.     Marland Kitchen omeprazole (PRILOSEC) 40 MG capsule Take 40 mg by mouth daily.     [name] IN Inhale into the lungs. 2 liters nightly    . Polyethylene Glycol 400 (BLINK TEARS OP) Place 1 drop into the right eye daily as needed (dryness).     . simvastatin (ZOCOR) 10 MG tablet Take 1 tablet by mouth daily.     No current facility-administered medications for this visit.    Allergies  Allergen Reactions  . Penicillins Hives  .  Lipitor [Atorvastatin] Swelling    History   Social History  . Marital Status: Single    Spouse Name: N/A  . Number of Children: N/A  . Years of Education: N/A   Occupational History  . Not on file.   Social History Main Topics  . Smoking status: Former Smoker -- 0.50 packs/day for 30 years    Types: Cigarettes    Quit date: 11/06/2005  . Smokeless tobacco: Never Used  . Alcohol Use: No     Comment: stopped 2005  . Drug Use: No  . Sexual Activity: Not on file   Other Topics Concern  . Not on file   Social History Narrative     Review of Systems: General: negative for chills, fever, night sweats or weight changes.  Cardiovascular: negative for chest pain, dyspnea on exertion, edema, orthopnea, palpitations, paroxysmal nocturnal dyspnea or shortness of breath Dermatological: negative for rash Respiratory: negative for cough or wheezing Urologic: negative for hematuria Abdominal: negative for nausea, vomiting, diarrhea, bright red blood per rectum, melena, or hematemesis Neurologic: negative for visual changes, syncope, or dizziness All other systems reviewed and are otherwise negative except as noted above.    Blood pressure 142/78, pulse 78, height 5' (1.524 m), weight 126 lb 4.8 oz (57.289 kg).  General appearance: alert, cooperative and no distress Neck: no carotid bruit and no JVD Lungs: clear to auscultation bilaterally Heart: regular rate and rhythm, S1, S2 normal, no murmur, click, rub or gallop Extremities: no LEE Pulses: 2+ and symmetric Skin: warm and dry Neurologic: Grossly normal  EKG Not performed  ASSESSMENT AND PLAN:   1. LEE Cellulitis:  Improved after treatment with Keflex x 2 rounds. Pain, swelling and increased warmth resolved. No s/s of systemic infection. Patient scheduled to f/u with PCP on 01/05/15.   PLAN Continue current plan of care. Keep f/u with Dr. Gwenlyn Found on 02/10/16.  Kayin Osment, BRITTAINYPA-C 01/01/2015 12:38 PM

## 2015-01-01 NOTE — Patient Instructions (Signed)
Follow up with Dr Gwenlyn Found at your regularly scheduled appointment.

## 2015-01-02 DIAGNOSIS — E119 Type 2 diabetes mellitus without complications: Secondary | ICD-10-CM | POA: Diagnosis not present

## 2015-01-05 DIAGNOSIS — L308 Other specified dermatitis: Secondary | ICD-10-CM | POA: Diagnosis not present

## 2015-01-05 DIAGNOSIS — E119 Type 2 diabetes mellitus without complications: Secondary | ICD-10-CM | POA: Diagnosis not present

## 2015-01-05 DIAGNOSIS — K7460 Unspecified cirrhosis of liver: Secondary | ICD-10-CM | POA: Diagnosis not present

## 2015-01-05 DIAGNOSIS — I739 Peripheral vascular disease, unspecified: Secondary | ICD-10-CM | POA: Diagnosis not present

## 2015-01-05 DIAGNOSIS — E1121 Type 2 diabetes mellitus with diabetic nephropathy: Secondary | ICD-10-CM | POA: Diagnosis not present

## 2015-01-05 DIAGNOSIS — J449 Chronic obstructive pulmonary disease, unspecified: Secondary | ICD-10-CM | POA: Diagnosis not present

## 2015-01-06 DIAGNOSIS — E119 Type 2 diabetes mellitus without complications: Secondary | ICD-10-CM | POA: Diagnosis not present

## 2015-01-07 DIAGNOSIS — E119 Type 2 diabetes mellitus without complications: Secondary | ICD-10-CM | POA: Diagnosis not present

## 2015-01-08 DIAGNOSIS — E119 Type 2 diabetes mellitus without complications: Secondary | ICD-10-CM | POA: Diagnosis not present

## 2015-01-09 DIAGNOSIS — E119 Type 2 diabetes mellitus without complications: Secondary | ICD-10-CM | POA: Diagnosis not present

## 2015-01-12 DIAGNOSIS — E119 Type 2 diabetes mellitus without complications: Secondary | ICD-10-CM | POA: Diagnosis not present

## 2015-01-13 DIAGNOSIS — E119 Type 2 diabetes mellitus without complications: Secondary | ICD-10-CM | POA: Diagnosis not present

## 2015-01-14 DIAGNOSIS — E119 Type 2 diabetes mellitus without complications: Secondary | ICD-10-CM | POA: Diagnosis not present

## 2015-01-15 DIAGNOSIS — E119 Type 2 diabetes mellitus without complications: Secondary | ICD-10-CM | POA: Diagnosis not present

## 2015-01-16 DIAGNOSIS — E119 Type 2 diabetes mellitus without complications: Secondary | ICD-10-CM | POA: Diagnosis not present

## 2015-01-19 DIAGNOSIS — E119 Type 2 diabetes mellitus without complications: Secondary | ICD-10-CM | POA: Diagnosis not present

## 2015-01-20 DIAGNOSIS — E119 Type 2 diabetes mellitus without complications: Secondary | ICD-10-CM | POA: Diagnosis not present

## 2015-01-21 DIAGNOSIS — J449 Chronic obstructive pulmonary disease, unspecified: Secondary | ICD-10-CM | POA: Diagnosis not present

## 2015-01-21 DIAGNOSIS — E119 Type 2 diabetes mellitus without complications: Secondary | ICD-10-CM | POA: Diagnosis not present

## 2015-01-22 DIAGNOSIS — E119 Type 2 diabetes mellitus without complications: Secondary | ICD-10-CM | POA: Diagnosis not present

## 2015-01-23 DIAGNOSIS — E119 Type 2 diabetes mellitus without complications: Secondary | ICD-10-CM | POA: Diagnosis not present

## 2015-01-26 DIAGNOSIS — E119 Type 2 diabetes mellitus without complications: Secondary | ICD-10-CM | POA: Diagnosis not present

## 2015-01-28 DIAGNOSIS — E119 Type 2 diabetes mellitus without complications: Secondary | ICD-10-CM | POA: Diagnosis not present

## 2015-01-29 DIAGNOSIS — E119 Type 2 diabetes mellitus without complications: Secondary | ICD-10-CM | POA: Diagnosis not present

## 2015-01-30 DIAGNOSIS — E119 Type 2 diabetes mellitus without complications: Secondary | ICD-10-CM | POA: Diagnosis not present

## 2015-02-02 DIAGNOSIS — E119 Type 2 diabetes mellitus without complications: Secondary | ICD-10-CM | POA: Diagnosis not present

## 2015-02-03 DIAGNOSIS — E119 Type 2 diabetes mellitus without complications: Secondary | ICD-10-CM | POA: Diagnosis not present

## 2015-02-04 DIAGNOSIS — E119 Type 2 diabetes mellitus without complications: Secondary | ICD-10-CM | POA: Diagnosis not present

## 2015-02-05 DIAGNOSIS — E119 Type 2 diabetes mellitus without complications: Secondary | ICD-10-CM | POA: Diagnosis not present

## 2015-02-06 DIAGNOSIS — E119 Type 2 diabetes mellitus without complications: Secondary | ICD-10-CM | POA: Diagnosis not present

## 2015-02-09 DIAGNOSIS — E119 Type 2 diabetes mellitus without complications: Secondary | ICD-10-CM | POA: Diagnosis not present

## 2015-02-10 ENCOUNTER — Ambulatory Visit (INDEPENDENT_AMBULATORY_CARE_PROVIDER_SITE_OTHER): Payer: Medicare Other | Admitting: Cardiovascular Disease

## 2015-02-10 ENCOUNTER — Encounter: Payer: Self-pay | Admitting: Cardiovascular Disease

## 2015-02-10 VITALS — BP 110/68 | HR 86 | Ht 60.0 in | Wt 121.0 lb

## 2015-02-10 DIAGNOSIS — I739 Peripheral vascular disease, unspecified: Secondary | ICD-10-CM | POA: Diagnosis not present

## 2015-02-10 DIAGNOSIS — E785 Hyperlipidemia, unspecified: Secondary | ICD-10-CM

## 2015-02-10 DIAGNOSIS — E119 Type 2 diabetes mellitus without complications: Secondary | ICD-10-CM | POA: Diagnosis not present

## 2015-02-10 DIAGNOSIS — I10 Essential (primary) hypertension: Secondary | ICD-10-CM | POA: Diagnosis not present

## 2015-02-10 NOTE — Assessment & Plan Note (Signed)
History of hypertension blood pressure measured at 110/68. She is on carvedilol and clonidine. Continue current meds at current dosing

## 2015-02-10 NOTE — Patient Instructions (Signed)
Your physician wants you to follow-up in: 1 year with Dr Berry. You will receive a reminder letter in the mail two months in advance. If you don't receive a letter, please call our office to schedule the follow-up appointment.  

## 2015-02-10 NOTE — Assessment & Plan Note (Signed)
History of peripheral vascular disease status post bilateral iliac artery stenting back in 2009 with a known short segment occlusion mid right SFA. She really has no lifestyle limiting claudication. Her last Doppler studies performed 10/04/13 revealed a right ABI 0.73 with an occluded right SFA and left ABI of 0.58.

## 2015-02-10 NOTE — Assessment & Plan Note (Signed)
History of hyperlipidemia on simvastatin 10 mg a day followed by her PCP

## 2015-02-10 NOTE — Progress Notes (Signed)
02/10/2015 MONACA WADAS   01-25-1946  [id number]  Primary Physician Philis Fendt, MD Primary Cardiologist: [name] MD [name]   HPI:  The patient is a 69 year-old mildly overweight single [place] American female with no children, who I last saw 2-3 months ago. She has a history of remote alcohol and tobacco abuse, having quit 6 to 7 years ago. She smoked 50 pack years prior to that and does have COPD. She had PVOD, status post bilateral iliac artery PTA and stenting back in 2009 with a known short-segment occlusion from mid right SFA. She does get some calf claudication, but denies chest pain or shortness of breath. She does have chronic renal insufficiency and chronic pancreatitis as well as alcoholic hepatitis and type 2 diabetes. Her last Myoview, performed March 18, 2011, was not ischemic. Her most recent arterial Dopplers, performed April 16, 2012, revealed ABIs of 0.77 bilaterally with patent iliac stents and short-segment occlusion, mid right SFA, unchanged from prior Doppler study. She saw [name], PA-C in the office a week ago with similar complaints. She denies chest pain or shortness of breath.she saw [name] registered nurse practitioner in the office after that with left knee pain. Since I saw her in the office 3 months ago she denies chest pain, shortness of breath or claudication.   Current Outpatient Prescriptions  Medication Sig Dispense Refill  . albuterol (PROVENTIL HFA;VENTOLIN HFA) 108 (90 BASE) MCG/ACT inhaler Inhale 2 puffs into the lungs every 6 (six) hours as needed. For shortness of breath    . albuterol (PROVENTIL) (2.5 MG/3ML) 0.083% nebulizer solution Take 2.5 mg by nebulization every 6 (six) hours as needed for wheezing or shortness of breath.    . allopurinol (ZYLOPRIM) 100 MG tablet Take 100 mg by mouth 2 (two) times daily.    Marland Kitchen aspirin 81 MG EC tablet Take 81 mg by mouth daily.      . carvedilol (COREG) 6.25 MG tablet  Take 1 tablet (6.25 mg total) by mouth 2 (two) times daily with a meal. 60 tablet 11  . cilostazol (PLETAL) 100 MG tablet Take 50 mg by mouth 2 (two) times daily before a meal.     . cloNIDine (CATAPRES) 0.1 MG tablet Take 1 tablet (0.1 mg total) by mouth 3 (three) times daily. 90 tablet 11  . ergocalciferol (VITAMIN D2) 50000 UNITS capsule Take 50,000 Units by mouth once a week. On Monday    . fluticasone (FLONASE) 50 MCG/ACT nasal spray Place 2 sprays into the nose daily. 50 mcg /act 2 sprays nasal once a day     . glucose monitoring kit (FREESTYLE) monitoring kit 1 each by Does not apply route 4 (four) times daily - after meals and at bedtime. 1 month Diabetic Testing Supplies for QAC-QHS accuchecks.Any brand OK 1 each 1  . insulin aspart (NOVOLOG) 100 UNIT/ML injection Before each meal 3 times a day, 140-199 - 2 units, 200-250 - 4 units, 251-299 - 6 units,  300-349 - 8 units,  350 or above 10 units. Insulin PEN if approved, provide syringes and needles if needed. 10 mL 11  . insulin glargine (LANTUS) 100 UNIT/ML injection Inject 0.08 mLs (8 Units total) into the skin at bedtime. Pen if approved 10 mL 11  . Insulin Syringe-Needle U-100 25G X 1" 1 ML MISC Any brand, for 4 times a day insulin SQ, 1 month supply. 30 each 0  . montelukast (SINGULAIR) 10 MG tablet Take 10 mg  by mouth at bedtime.     Marland Kitchen omeprazole (PRILOSEC) 40 MG capsule Take 40 mg by mouth daily.     [name] IN Inhale into the lungs. 2 liters nightly    . Polyethylene Glycol 400 (BLINK TEARS OP) Place 1 drop into the right eye daily as needed (dryness).     . simvastatin (ZOCOR) 10 MG tablet Take 1 tablet by mouth daily.     No current facility-administered medications for this visit.    Allergies  Allergen Reactions  . Penicillins Hives  . Lipitor [Atorvastatin] Swelling    History   Social History  . Marital Status: Single    Spouse Name: N/A  . Number of Children: N/A  . Years of Education: N/A    Occupational History  . Not on file.   Social History Main Topics  . Smoking status: Former Smoker -- 0.50 packs/day for 30 years    Types: Cigarettes    Quit date: 11/06/2005  . Smokeless tobacco: Never Used  . Alcohol Use: No     Comment: stopped 2005  . Drug Use: No  . Sexual Activity: Not on file   Other Topics Concern  . Not on file   Social History Narrative     Review of Systems: General: negative for chills, fever, night sweats or weight changes.  Cardiovascular: negative for chest pain, dyspnea on exertion, edema, orthopnea, palpitations, paroxysmal nocturnal dyspnea or shortness of breath Dermatological: negative for rash Respiratory: negative for cough or wheezing Urologic: negative for hematuria Abdominal: negative for nausea, vomiting, diarrhea, bright red blood per rectum, melena, or hematemesis Neurologic: negative for visual changes, syncope, or dizziness All other systems reviewed and are otherwise negative except as noted above.    Blood pressure 110/68, pulse 86, height 5' (1.524 m), weight 121 lb (54.885 kg).  General appearance: alert and no distress Neck: no adenopathy, no carotid bruit, no JVD, supple, symmetrical, trachea midline and thyroid not enlarged, symmetric, no tenderness/mass/nodules Lungs: clear to auscultation bilaterally Heart: regular rate and rhythm, S1, S2 normal, no murmur, click, rub or gallop Extremities: extremities normal, atraumatic, no cyanosis or edema  EKG normal sinus rhythm at 86 without ST or T-wave changes. I personally reviewed this EKG  ASSESSMENT AND PLAN:   PVD, LEIA/LIIA and RCIA pta 7/09- ABIs 0.68 and 0.69 Feb 2014 History of peripheral vascular disease status post bilateral iliac artery stenting back in 2009 with a known short segment occlusion mid right SFA. She really has no lifestyle limiting claudication. Her last Doppler studies performed 10/04/13 revealed a right ABI 0.73 with an occluded right SFA and left  ABI of 0.58.  Essential hypertension History of hypertension blood pressure measured at 110/68. She is on carvedilol and clonidine. Continue current meds at current dosing  Hyperlipidemia History of hyperlipidemia on simvastatin 10 mg a day followed by her PCP      [name] MD [facility], [facility] 02/10/2015 4:29 PM

## 2015-02-11 DIAGNOSIS — E119 Type 2 diabetes mellitus without complications: Secondary | ICD-10-CM | POA: Diagnosis not present

## 2015-02-12 DIAGNOSIS — E119 Type 2 diabetes mellitus without complications: Secondary | ICD-10-CM | POA: Diagnosis not present

## 2015-02-13 ENCOUNTER — Emergency Department (HOSPITAL_COMMUNITY)
Admission: EM | Admit: 2015-02-13 | Discharge: 2015-02-13 | Discharge status: Absent without leave | Payer: Medicare Other

## 2015-02-13 DIAGNOSIS — E119 Type 2 diabetes mellitus without complications: Secondary | ICD-10-CM | POA: Diagnosis not present

## 2015-02-21 DIAGNOSIS — J449 Chronic obstructive pulmonary disease, unspecified: Secondary | ICD-10-CM | POA: Diagnosis not present

## 2015-03-04 ENCOUNTER — Other Ambulatory Visit: Payer: Self-pay | Admitting: Cardiovascular Disease

## 2015-05-01 DIAGNOSIS — J449 Chronic obstructive pulmonary disease, unspecified: Secondary | ICD-10-CM | POA: Diagnosis not present

## 2015-05-01 DIAGNOSIS — E1121 Type 2 diabetes mellitus with diabetic nephropathy: Secondary | ICD-10-CM | POA: Diagnosis not present

## 2015-05-01 DIAGNOSIS — J209 Acute bronchitis, unspecified: Secondary | ICD-10-CM | POA: Diagnosis not present

## 2015-05-01 DIAGNOSIS — K219 Gastro-esophageal reflux disease without esophagitis: Secondary | ICD-10-CM | POA: Diagnosis not present

## 2015-05-09 ENCOUNTER — Emergency Department (HOSPITAL_COMMUNITY): Payer: Medicare Other

## 2015-05-09 ENCOUNTER — Encounter (HOSPITAL_COMMUNITY): Payer: Self-pay | Admitting: Nurse Practitioner

## 2015-05-09 ENCOUNTER — Inpatient Hospital Stay (HOSPITAL_COMMUNITY)
Admission: EM | Admit: 2015-05-09 | Discharge: 2015-05-12 | DRG: 392 | Disposition: A | Discharge status: Home Health | Payer: Medicare Other | Attending: Family Medicine | Admitting: Family Medicine

## 2015-05-09 DIAGNOSIS — E1122 Type 2 diabetes mellitus with diabetic chronic kidney disease: Secondary | ICD-10-CM | POA: Diagnosis not present

## 2015-05-09 DIAGNOSIS — M1990 Unspecified osteoarthritis, unspecified site: Secondary | ICD-10-CM | POA: Diagnosis present

## 2015-05-09 DIAGNOSIS — E875 Hyperkalemia: Secondary | ICD-10-CM | POA: Diagnosis present

## 2015-05-09 DIAGNOSIS — Z7982 Long term (current) use of aspirin: Secondary | ICD-10-CM

## 2015-05-09 DIAGNOSIS — I129 Hypertensive chronic kidney disease with stage 1 through stage 4 chronic kidney disease, or unspecified chronic kidney disease: Secondary | ICD-10-CM | POA: Diagnosis not present

## 2015-05-09 DIAGNOSIS — E876 Hypokalemia: Secondary | ICD-10-CM | POA: Diagnosis present

## 2015-05-09 DIAGNOSIS — F419 Anxiety disorder, unspecified: Secondary | ICD-10-CM | POA: Diagnosis present

## 2015-05-09 DIAGNOSIS — K529 Noninfective gastroenteritis and colitis, unspecified: Secondary | ICD-10-CM | POA: Diagnosis not present

## 2015-05-09 DIAGNOSIS — Z8711 Personal history of peptic ulcer disease: Secondary | ICD-10-CM | POA: Diagnosis not present

## 2015-05-09 DIAGNOSIS — D649 Anemia, unspecified: Secondary | ICD-10-CM | POA: Diagnosis not present

## 2015-05-09 DIAGNOSIS — I501 Left ventricular failure: Secondary | ICD-10-CM | POA: Diagnosis not present

## 2015-05-09 DIAGNOSIS — I739 Peripheral vascular disease, unspecified: Secondary | ICD-10-CM | POA: Diagnosis present

## 2015-05-09 DIAGNOSIS — J45909 Unspecified asthma, uncomplicated: Secondary | ICD-10-CM | POA: Diagnosis present

## 2015-05-09 DIAGNOSIS — Z86010 Personal history of colonic polyps: Secondary | ICD-10-CM

## 2015-05-09 DIAGNOSIS — R140 Abdominal distension (gaseous): Secondary | ICD-10-CM

## 2015-05-09 DIAGNOSIS — A419 Sepsis, unspecified organism: Secondary | ICD-10-CM | POA: Diagnosis not present

## 2015-05-09 DIAGNOSIS — E860 Dehydration: Secondary | ICD-10-CM | POA: Diagnosis present

## 2015-05-09 DIAGNOSIS — Z9981 Dependence on supplemental oxygen: Secondary | ICD-10-CM | POA: Diagnosis not present

## 2015-05-09 DIAGNOSIS — E11649 Type 2 diabetes mellitus with hypoglycemia without coma: Secondary | ICD-10-CM | POA: Diagnosis present

## 2015-05-09 DIAGNOSIS — R52 Pain, unspecified: Secondary | ICD-10-CM

## 2015-05-09 DIAGNOSIS — Z79899 Other long term (current) drug therapy: Secondary | ICD-10-CM

## 2015-05-09 DIAGNOSIS — H409 Unspecified glaucoma: Secondary | ICD-10-CM | POA: Diagnosis present

## 2015-05-09 DIAGNOSIS — J449 Chronic obstructive pulmonary disease, unspecified: Secondary | ICD-10-CM | POA: Diagnosis present

## 2015-05-09 DIAGNOSIS — Z880 Allergy status to penicillin: Secondary | ICD-10-CM | POA: Diagnosis not present

## 2015-05-09 DIAGNOSIS — Z800 Family history of malignant neoplasm of digestive organs: Secondary | ICD-10-CM | POA: Diagnosis not present

## 2015-05-09 DIAGNOSIS — I959 Hypotension, unspecified: Secondary | ICD-10-CM | POA: Diagnosis not present

## 2015-05-09 DIAGNOSIS — N183 Chronic kidney disease, stage 3 unspecified: Secondary | ICD-10-CM | POA: Diagnosis present

## 2015-05-09 DIAGNOSIS — Z888 Allergy status to other drugs, medicaments and biological substances status: Secondary | ICD-10-CM | POA: Diagnosis not present

## 2015-05-09 DIAGNOSIS — K868 Other specified diseases of pancreas: Secondary | ICD-10-CM | POA: Diagnosis not present

## 2015-05-09 DIAGNOSIS — K219 Gastro-esophageal reflux disease without esophagitis: Secondary | ICD-10-CM | POA: Diagnosis present

## 2015-05-09 DIAGNOSIS — E785 Hyperlipidemia, unspecified: Secondary | ICD-10-CM | POA: Diagnosis present

## 2015-05-09 DIAGNOSIS — C189 Malignant neoplasm of colon, unspecified: Secondary | ICD-10-CM | POA: Diagnosis present

## 2015-05-09 DIAGNOSIS — M109 Gout, unspecified: Secondary | ICD-10-CM | POA: Diagnosis present

## 2015-05-09 DIAGNOSIS — I10 Essential (primary) hypertension: Secondary | ICD-10-CM | POA: Diagnosis present

## 2015-05-09 DIAGNOSIS — J441 Chronic obstructive pulmonary disease with (acute) exacerbation: Secondary | ICD-10-CM | POA: Diagnosis present

## 2015-05-09 DIAGNOSIS — G4733 Obstructive sleep apnea (adult) (pediatric): Secondary | ICD-10-CM | POA: Diagnosis not present

## 2015-05-09 DIAGNOSIS — Z85038 Personal history of other malignant neoplasm of large intestine: Secondary | ICD-10-CM

## 2015-05-09 DIAGNOSIS — R112 Nausea with vomiting, unspecified: Secondary | ICD-10-CM | POA: Diagnosis present

## 2015-05-09 DIAGNOSIS — K7030 Alcoholic cirrhosis of liver without ascites: Secondary | ICD-10-CM | POA: Diagnosis present

## 2015-05-09 DIAGNOSIS — K439 Ventral hernia without obstruction or gangrene: Secondary | ICD-10-CM | POA: Diagnosis present

## 2015-05-09 DIAGNOSIS — E118 Type 2 diabetes mellitus with unspecified complications: Secondary | ICD-10-CM | POA: Diagnosis present

## 2015-05-09 DIAGNOSIS — K861 Other chronic pancreatitis: Secondary | ICD-10-CM | POA: Diagnosis present

## 2015-05-09 DIAGNOSIS — K279 Peptic ulcer, site unspecified, unspecified as acute or chronic, without hemorrhage or perforation: Secondary | ICD-10-CM | POA: Diagnosis present

## 2015-05-09 DIAGNOSIS — R404 Transient alteration of awareness: Secondary | ICD-10-CM | POA: Diagnosis not present

## 2015-05-09 DIAGNOSIS — Z87891 Personal history of nicotine dependence: Secondary | ICD-10-CM | POA: Diagnosis not present

## 2015-05-09 DIAGNOSIS — E1151 Type 2 diabetes mellitus with diabetic peripheral angiopathy without gangrene: Secondary | ICD-10-CM | POA: Diagnosis not present

## 2015-05-09 DIAGNOSIS — R350 Frequency of micturition: Secondary | ICD-10-CM | POA: Diagnosis present

## 2015-05-09 DIAGNOSIS — I5032 Chronic diastolic (congestive) heart failure: Secondary | ICD-10-CM | POA: Diagnosis not present

## 2015-05-09 DIAGNOSIS — K625 Hemorrhage of anus and rectum: Secondary | ICD-10-CM | POA: Diagnosis present

## 2015-05-09 DIAGNOSIS — G4730 Sleep apnea, unspecified: Secondary | ICD-10-CM | POA: Diagnosis present

## 2015-05-09 DIAGNOSIS — K7010 Alcoholic hepatitis without ascites: Secondary | ICD-10-CM | POA: Diagnosis not present

## 2015-05-09 DIAGNOSIS — F329 Major depressive disorder, single episode, unspecified: Secondary | ICD-10-CM | POA: Diagnosis present

## 2015-05-09 DIAGNOSIS — Z794 Long term (current) use of insulin: Secondary | ICD-10-CM

## 2015-05-09 DIAGNOSIS — R42 Dizziness and giddiness: Secondary | ICD-10-CM | POA: Diagnosis not present

## 2015-05-09 DIAGNOSIS — N179 Acute kidney failure, unspecified: Secondary | ICD-10-CM | POA: Diagnosis present

## 2015-05-09 DIAGNOSIS — R197 Diarrhea, unspecified: Secondary | ICD-10-CM | POA: Diagnosis not present

## 2015-05-09 DIAGNOSIS — K7460 Unspecified cirrhosis of liver: Secondary | ICD-10-CM | POA: Diagnosis present

## 2015-05-09 DIAGNOSIS — K5730 Diverticulosis of large intestine without perforation or abscess without bleeding: Secondary | ICD-10-CM | POA: Diagnosis not present

## 2015-05-09 HISTORY — DX: Chronic diastolic (congestive) heart failure: I50.32

## 2015-05-09 LAB — URINALYSIS, ROUTINE W REFLEX MICROSCOPIC
BILIRUBIN URINE: NEGATIVE
Glucose, UA: NEGATIVE mg/dL
Ketones, ur: NEGATIVE mg/dL
NITRITE: NEGATIVE
PH: 6 (ref 5.0–8.0)
Protein, ur: NEGATIVE mg/dL
SPECIFIC GRAVITY, URINE: 1.01 (ref 1.005–1.030)
Urobilinogen, UA: 0.2 mg/dL (ref 0.0–1.0)

## 2015-05-09 LAB — CBC
HEMATOCRIT: 29.7 % — AB (ref 36.0–46.0)
HEMOGLOBIN: 9.7 g/dL — AB (ref 12.0–15.0)
MCH: 27.8 pg (ref 26.0–34.0)
MCHC: 32.7 g/dL (ref 30.0–36.0)
MCV: 85.1 fL (ref 78.0–100.0)
Platelets: 191 10*3/uL (ref 150–400)
RBC: 3.49 MIL/uL — ABNORMAL LOW (ref 3.87–5.11)
RDW: 14 % (ref 11.5–15.5)
WBC: 13.2 10*3/uL — AB (ref 4.0–10.5)

## 2015-05-09 LAB — COMPREHENSIVE METABOLIC PANEL
ALBUMIN: 2.6 g/dL — AB (ref 3.5–5.0)
ALT: 22 U/L (ref 14–54)
AST: 44 U/L — AB (ref 15–41)
Alkaline Phosphatase: 108 U/L (ref 38–126)
Anion gap: 10 (ref 5–15)
BUN: 32 mg/dL — AB (ref 6–20)
CHLORIDE: 100 mmol/L — AB (ref 101–111)
CO2: 24 mmol/L (ref 22–32)
Calcium: 8.1 mg/dL — ABNORMAL LOW (ref 8.9–10.3)
Creatinine, Ser: 2.06 mg/dL — ABNORMAL HIGH (ref 0.44–1.00)
GFR calc Af Amer: 27 mL/min — ABNORMAL LOW (ref >60)
GFR calc non Af Amer: 23 mL/min — ABNORMAL LOW (ref >60)
GLUCOSE: 116 mg/dL — AB (ref 65–99)
POTASSIUM: 2.4 mmol/L — AB (ref 3.5–5.1)
Sodium: 134 mmol/L — ABNORMAL LOW (ref 135–145)
Total Bilirubin: 0.5 mg/dL (ref 0.3–1.2)
Total Protein: 5.9 g/dL — ABNORMAL LOW (ref 6.5–8.1)

## 2015-05-09 LAB — URINE MICROSCOPIC-ADD ON

## 2015-05-09 LAB — CBG MONITORING, ED: Glucose-Capillary: 113 mg/dL — ABNORMAL HIGH (ref 65–99)

## 2015-05-09 LAB — LIPASE, BLOOD: LIPASE: 18 U/L — AB (ref 22–51)

## 2015-05-09 LAB — ETHANOL

## 2015-05-09 MED ORDER — MORPHINE SULFATE (PF) 4 MG/ML IV SOLN: 1.0000 mg | INTRAVENOUS | Status: DC | PRN

## 2015-05-09 MED ORDER — CILOSTAZOL 50 MG PO TABS
50.0000 mg | ORAL_TABLET | Freq: Two times a day (BID) | ORAL | Status: DC
  Administered 2015-05-10 – 2015-05-12 (×5): 50 mg via ORAL
  Filled 2015-05-09 (×9): qty 1

## 2015-05-09 MED ORDER — INSULIN GLARGINE 100 UNIT/ML ~~LOC~~ SOLN
5.0000 [IU] | Freq: Every day | SUBCUTANEOUS | Status: DC
  Administered 2015-05-10 – 2015-05-11 (×3): 5 [IU] via SUBCUTANEOUS
  Filled 2015-05-09 (×4): qty 0.05

## 2015-05-09 MED ORDER — POLYETHYLENE GLYCOL 400 0.25 % OP SOLN: Freq: Every day | OPHTHALMIC | Status: DC | PRN

## 2015-05-09 MED ORDER — SODIUM CHLORIDE 0.9 % IV SOLN
INTRAVENOUS | Status: DC
  Administered 2015-05-10: 02:00:00 via INTRAVENOUS

## 2015-05-09 MED ORDER — CARVEDILOL 6.25 MG PO TABS
6.2500 mg | ORAL_TABLET | Freq: Two times a day (BID) | ORAL | Status: DC
  Administered 2015-05-10 – 2015-05-12 (×4): 6.25 mg via ORAL
  Filled 2015-05-09 (×9): qty 1

## 2015-05-09 MED ORDER — FLUTICASONE PROPIONATE 50 MCG/ACT NA SUSP
2.0000 | Freq: Every day | NASAL | Status: DC | PRN
  Filled 2015-05-09: qty 16

## 2015-05-09 MED ORDER — POLYVINYL ALCOHOL 1.4 % OP SOLN
1.0000 [drp] | Freq: Every day | OPHTHALMIC | Status: DC | PRN
  Filled 2015-05-09: qty 15

## 2015-05-09 MED ORDER — SIMVASTATIN 10 MG PO TABS
10.0000 mg | ORAL_TABLET | Freq: Every day | ORAL | Status: DC
  Administered 2015-05-10 – 2015-05-11 (×2): 10 mg via ORAL
  Filled 2015-05-09 (×4): qty 1

## 2015-05-09 MED ORDER — ALLOPURINOL 100 MG PO TABS
100.0000 mg | ORAL_TABLET | Freq: Two times a day (BID) | ORAL | Status: DC
  Administered 2015-05-10 – 2015-05-12 (×5): 100 mg via ORAL
  Filled 2015-05-09 (×8): qty 1

## 2015-05-09 MED ORDER — FAMOTIDINE IN NACL 20-0.9 MG/50ML-% IV SOLN
20.0000 mg | Freq: Two times a day (BID) | INTRAVENOUS | Status: DC
  Administered 2015-05-10 (×3): 20 mg via INTRAVENOUS
  Filled 2015-05-09 (×4): qty 50

## 2015-05-09 MED ORDER — ALBUTEROL SULFATE (2.5 MG/3ML) 0.083% IN NEBU: 2.5000 mg | INHALATION_SOLUTION | Freq: Four times a day (QID) | RESPIRATORY_TRACT | Status: DC | PRN

## 2015-05-09 MED ORDER — ONDANSETRON HCL 4 MG/2ML IJ SOLN: 4.0000 mg | Freq: Three times a day (TID) | INTRAMUSCULAR | Status: DC | PRN

## 2015-05-09 MED ORDER — MORPHINE SULFATE (PF) 4 MG/ML IV SOLN
4.0000 mg | Freq: Once | INTRAVENOUS | Status: AC
  Administered 2015-05-09: 4 mg via INTRAVENOUS
  Filled 2015-05-09: qty 1

## 2015-05-09 MED ORDER — HEPARIN SODIUM (PORCINE) 5000 UNIT/ML IJ SOLN
5000.0000 [IU] | Freq: Three times a day (TID) | INTRAMUSCULAR | Status: DC
  Administered 2015-05-10 – 2015-05-12 (×8): 5000 [IU] via SUBCUTANEOUS
  Filled 2015-05-09 (×11): qty 1

## 2015-05-09 MED ORDER — MAGNESIUM SULFATE 2 GM/50ML IV SOLN
2.0000 g | INTRAVENOUS | Status: AC
  Administered 2015-05-09: 2 g via INTRAVENOUS
  Filled 2015-05-09: qty 50

## 2015-05-09 MED ORDER — ONDANSETRON HCL 4 MG/2ML IJ SOLN
4.0000 mg | Freq: Once | INTRAMUSCULAR | Status: AC
  Administered 2015-05-09: 4 mg via INTRAVENOUS
  Filled 2015-05-09: qty 2

## 2015-05-09 MED ORDER — POTASSIUM CHLORIDE 10 MEQ/100ML IV SOLN
10.0000 meq | INTRAVENOUS | Status: AC
  Administered 2015-05-10 (×3): 10 meq via INTRAVENOUS
  Filled 2015-05-09 (×2): qty 100

## 2015-05-09 MED ORDER — MONTELUKAST SODIUM 10 MG PO TABS
10.0000 mg | ORAL_TABLET | Freq: Every day | ORAL | Status: DC
  Administered 2015-05-10 – 2015-05-11 (×2): 10 mg via ORAL
  Filled 2015-05-09 (×4): qty 1

## 2015-05-09 MED ORDER — VITAMIN D (ERGOCALCIFEROL) 1.25 MG (50000 UNIT) PO CAPS
50000.0000 [IU] | ORAL_CAPSULE | ORAL | Status: DC
  Administered 2015-05-11: 50000 [IU] via ORAL
  Filled 2015-05-09: qty 1

## 2015-05-09 MED ORDER — INSULIN ASPART 100 UNIT/ML ~~LOC~~ SOLN
0.0000 [IU] | Freq: Three times a day (TID) | SUBCUTANEOUS | Status: DC
  Administered 2015-05-10: 2 [IU] via SUBCUTANEOUS

## 2015-05-09 MED ORDER — SODIUM CHLORIDE 0.9 % IV BOLUS (SEPSIS)
1000.0000 mL | Freq: Once | INTRAVENOUS | Status: AC
  Administered 2015-05-09: 1000 mL via INTRAVENOUS

## 2015-05-09 MED ORDER — ASPIRIN EC 81 MG PO TBEC
81.0000 mg | DELAYED_RELEASE_TABLET | Freq: Every day | ORAL | Status: DC
  Administered 2015-05-10 – 2015-05-12 (×3): 81 mg via ORAL
  Filled 2015-05-09 (×3): qty 1

## 2015-05-09 MED ORDER — POTASSIUM CHLORIDE 10 MEQ/100ML IV SOLN
10.0000 meq | Freq: Once | INTRAVENOUS | Status: AC
  Administered 2015-05-09: 10 meq via INTRAVENOUS
  Filled 2015-05-09: qty 100

## 2015-05-09 MED ORDER — POTASSIUM CHLORIDE CRYS ER 20 MEQ PO TBCR: 40.0000 meq | EXTENDED_RELEASE_TABLET | Freq: Once | ORAL | Status: DC

## 2015-05-09 MED ORDER — ONDANSETRON HCL 4 MG/2ML IJ SOLN
4.0000 mg | Freq: Once | INTRAMUSCULAR | Status: AC | PRN
  Administered 2015-05-09: 4 mg via INTRAVENOUS
  Filled 2015-05-09: qty 2

## 2015-05-09 MED ORDER — NICOTINE 21 MG/24HR TD PT24
21.0000 mg | MEDICATED_PATCH | Freq: Every day | TRANSDERMAL | Status: DC
  Filled 2015-05-09 (×3): qty 1

## 2015-05-09 NOTE — ED Notes (Signed)
Bed: QV95 Expected date: 05/09/15 Expected time: 5:21 PM Means of arrival: Ambulance Comments: Hold 22 for N/V D elderly

## 2015-05-09 NOTE — ED Notes (Signed)
Pt is presented from home, report of N/D/D in the last 4 days, pt currently vomiting, plan to get a rectal temp after pt is stable.

## 2015-05-09 NOTE — H&P (Signed)
[facility] History and Physical  [name] WFU:[id number] DOB: 05-19-1946 DOA: 05/09/2015  Referring physician: ED physician PCP: Philis Fendt, MD  Specialists:   Chief Complaint: Nausea, vomiting, diarrhea, abdominal pain  HPI: [name] is a 69 y.o. female with PMH of peptic ulcer disease, GI bleeding, colitis, asthma, COPD, anemia, PVD, DVT, alcoholic hepatitis, CKD-III, hypertension, hyperlipidemia, diabetes mellitus, GERD, gout, depression, anxiety, who presents with nausea, vomiting, diarrhea and abdominal pain.  Patient reports that she had nausea, vomiting, diarrhea 3 weeks ago, which lasted for 3 days and then resolved. She has been doing fine until 4 days ago, when she started having similar symptoms again, including nausea, vomiting, diarrhea and abdominal pain. She vomited 4 times and had 4 bowel movements with loose stool today. She has diffused moderate abdominal pain, which is constant and nonradiating. She also has abdominal distention. She reports that she has an increased urinary frequency recently, but no dysuria or burning on urination. She said has mild cough and shortness of breath due to COPD, which are at her baseline. She does not have chest pain. She does not have fever, but has chills. No unilateral weakness. Patient's blood pressure was initially low at 84/64, which improved to 129/53 after 2 L of normal saline bolus in ED  In ED, patient was found to have lipase 18, WBC 13.2, temperature normal, no tachycardia, positive urinalysis with a small amount of leukocytes, potassium 2.4, worsening renal function. CT-abdomen/pelvis showed calcifications in the pancreas consistent with chronic pancreatitis. Prominence the gastric wall may be due to thickening or normal under distention, gastritis is not excluded. No evidence of bowel obstruction. Diverticulosis of sigmoid colon without evidence of diverticulitis. Inferior ventral abdominal wall hernia containing  fat. Patient is admitted to inpatient for further evaluation and treatment.  Where does patient live?   At home  Can patient participate in ADLs?  Barely   Review of Systems:   General: no fevers, has chills, no changes in body weight, has poor appetite, has fatigue HEENT: no blurry vision, hearing changes or sore throat Pulm: has dyspnea, coughing, no wheezing CV: no chest pain, palpitations Abd: has nausea, vomiting, abdominal pain, diarrhea, no constipation GU: no dysuria, burning on urination, has increased urinary frequency, no hematuria  Ext: no leg edema Neuro: no unilateral weakness, numbness, or tingling, no vision change or hearing loss Skin: no rash MSK: No muscle spasm, no deformity, no limitation of range of movement in spin Heme: No easy bruising.  Travel history: No recent long distant travel.  Allergy:  Allergies  Allergen Reactions  . Penicillins Hives  . Lipitor [Atorvastatin] Swelling    Past Medical History  Diagnosis Date  . Alcoholic cirrhosis   . Reflux esophagitis   . Gastroesophageal reflux   . Colon polyps   . CHF (congestive heart failure) 10/13    grade 1 diastolic dysfunction, Nl LVF  . Hypertension   . History of GI diverticular bleed   . Pneumonia     history of  . Asthma   . Anemia   . Blood transfusion few years ago  . Villous adenoma of colon 05/10/11  . PVD (peripheral vascular disease) 2009    bilat iliac stenting  . Chronic obstructive pulmonary disease (COPD)   . Arthritis   . Glaucoma     left  . Gout   . Acute venous embolism and thrombosis of deep vessels of proximal lower extremity   . Back pain   . Leg  swelling   . Diabetes mellitus     insulin dep   . Hepatitis     alcoholic hepatitis  . Depression   . Anxiety   . On home oxygen therapy oxygen 2 liter per minute prn per [place]  . Cellulitis     bilateral lower extremities    Past Surgical History  Procedure Laterality Date  . Cataract extraction Right yrs ago  .  Colonoscopy    . Laparoscopic assissted total colectomy w/ j-pouch  04/22/11  . Ventral hernia repair  06/13/2012    Procedure: LAPAROSCOPIC VENTRAL HERNIA;  Surgeon: Adin Hector, MD;  Location: [facility];  Service: General;  Laterality: N/A;  Laparoscopic Assisted Ventral Hernia with Mesh  . Abdominal hysterectomy  yrs ago  . Esophagogastroduodenoscopy (egd) with propofol N/A 08/06/2013    Procedure: ESOPHAGOGASTRODUODENOSCOPY (EGD) WITH PROPOFOL;  Surgeon: Lear Ng, MD;  Location: [facility];  Service: Endoscopy;  Laterality: N/A;  . Colonoscopy with propofol N/A 08/06/2013    Procedure: COLONOSCOPY WITH PROPOFOL;  Surgeon: Lear Ng, MD;  Location: [facility];  Service: Endoscopy;  Laterality: N/A;    Social History:  reports that she quit smoking about 9 years ago. Her smoking use included Cigarettes. She has a 15 pack-year smoking history. She has never used smokeless tobacco. She reports that she does not drink alcohol or use illicit drugs.  Family History:  Family History  Problem Relation Age of Onset  . Cancer Brother     heent ca  . Cancer Sister     liver ca     Prior to Admission medications   Medication Sig Start Date End Date Taking? Authorizing Provider  albuterol (PROVENTIL HFA;VENTOLIN HFA) 108 (90 BASE) MCG/ACT inhaler Inhale 2 puffs into the lungs every 6 (six) hours as needed. For shortness of breath   Yes Historical Provider, MD  albuterol (PROVENTIL) (2.5 MG/3ML) 0.083% nebulizer solution Take 2.5 mg by nebulization every 6 (six) hours as needed for wheezing or shortness of breath.   Yes Historical Provider, MD  allopurinol (ZYLOPRIM) 100 MG tablet Take 100 mg by mouth 2 (two) times daily.   Yes Historical Provider, MD  aspirin 81 MG EC tablet Take 81 mg by mouth daily.     Yes Historical Provider, MD  carvedilol (COREG) 6.25 MG tablet Take 1 tablet (6.25 mg total) by mouth 2 (two) times daily with a meal. 09/05/14  Yes [name], MD   cilostazol (PLETAL) 100 MG tablet Take 50 mg by mouth 2 (two) times daily before a meal.    Yes Historical Provider, MD  cloNIDine (CATAPRES) 0.1 MG tablet Take 1 tablet (0.1 mg total) by mouth 3 (three) times daily. 09/05/14  Yes [name], MD  ergocalciferol (VITAMIN D2) 50000 UNITS capsule Take 50,000 Units by mouth once a week. On Monday   Yes Historical Provider, MD  fluticasone (FLONASE) 50 MCG/ACT nasal spray Place 2 sprays into the nose daily as needed for allergies.    Yes Historical Provider, MD  glucose monitoring kit (FREESTYLE) monitoring kit 1 each by Does not apply route 4 (four) times daily - after meals and at bedtime. 1 month Diabetic Testing Supplies for QAC-QHS accuchecks.Any brand OK 07/30/14  Yes Thurnell Lose, MD  insulin glargine (LANTUS) 100 UNIT/ML injection Inject 0.08 mLs (8 Units total) into the skin at bedtime. Pen if approved 07/30/14  Yes Thurnell Lose, MD  Insulin Syringe-Needle U-100 25G X 1" 1 ML MISC Any  brand, for 4 times a day insulin SQ, 1 month supply. 07/30/14  Yes Thurnell Lose, MD  metoprolol (LOPRESSOR) 50 MG tablet Take 25 mg by mouth 2 (two) times daily. 03/04/15  Yes Historical Provider, MD  montelukast (SINGULAIR) 10 MG tablet Take 10 mg by mouth at bedtime.    Yes Historical Provider, MD  omeprazole (PRILOSEC) 40 MG capsule Take 40 mg by mouth daily.    Yes Historical Provider, MD  OXYGEN-HELIUM IN Inhale into the lungs. 2 liters nightly   Yes Historical Provider, MD  Polyethylene Glycol 400 (BLINK TEARS OP) Place 1 drop into the right eye daily as needed (dryness).    Yes Historical Provider, MD  simvastatin (ZOCOR) 10 MG tablet Take 10 mg by mouth daily.  11/30/14  Yes Historical Provider, MD  insulin aspart (NOVOLOG) 100 UNIT/ML injection Before each meal 3 times a day, 140-199 - 2 units, 200-250 - 4 units, 251-299 - 6 units,  300-349 - 8 units,  350 or above 10 units. Insulin PEN if approved, provide syringes and needles if needed. Patient  not taking: Reported on 05/09/2015 07/30/14   Thurnell Lose, MD    Physical Exam: Filed Vitals:   05/09/15 1755 05/09/15 1919 05/09/15 2027 05/09/15 2253  BP: 150/83 84/64 129/53 126/64  Pulse: 89 74 69 84  Temp: 98.4 F (36.9 C) 98.4 F (36.9 C) 97.6 F (36.4 C) 97.7 F (36.5 C)  TempSrc: Oral Oral Oral Oral  Resp: '18 16 17 20  ' SpO2: 98% 94% 98% 94%   General: Not in acute distress HEENT:       Eyes: PERRL, EOMI, no scleral icterus.       ENT: No discharge from the ears and nose, no pharynx injection, no tonsillar enlargement.        Neck: No JVD, no bruit, no mass felt. Heme: No neck lymph node enlargement. Cardiac: S1/S2, RRR, No murmurs, No gallops or rubs. Pulm: No rales, wheezing, rhonchi or rubs. Abd: Soft, distended, diffusely tender, no rebound pain, no organomegaly, BS present. Ext: No pitting leg edema bilaterally. 2+DP/PT pulse bilaterally. Musculoskeletal: No joint deformities, No joint redness or warmth, no limitation of ROM in spin. Skin: No rashes.  Neuro: Alert, oriented X3, cranial nerves II-XII grossly intact, muscle strength 5/5 in all extremities, sensation to light touch intact.  Psych: Patient is not psychotic, no suicidal or hemocidal ideation.  Labs on Admission:  Basic Metabolic Panel:  Recent Labs Lab 05/09/15 1900  NA 134*  K 2.4*  CL 100*  CO2 24  GLUCOSE 116*  BUN 32*  CREATININE 2.06*  CALCIUM 8.1*   Liver Function Tests:  Recent Labs Lab 05/09/15 1900  AST 44*  ALT 22  ALKPHOS 108  BILITOT 0.5  PROT 5.9*  ALBUMIN 2.6*    Recent Labs Lab 05/09/15 1900  LIPASE 18*   No results for input(s): AMMONIA in the last 168 hours. CBC:  Recent Labs Lab 05/09/15 1900  WBC 13.2*  HGB 9.7*  HCT 29.7*  MCV 85.1  PLT 191   Cardiac Enzymes: No results for input(s): CKTOTAL, CKMB, CKMBINDEX, TROPONINI in the last 168 hours.  BNP (last 3 results) No results for input(s): BNP in the last 8760 hours.  ProBNP (last 3  results) No results for input(s): PROBNP in the last 8760 hours.  CBG:  Recent Labs Lab 05/09/15 1856  GLUCAP 113*    Radiological Exams on Admission: Ct Abdomen Pelvis Wo Contrast  05/09/2015   CLINICAL  DATA:  Nausea, vomiting, and diarrhea for the last 4 days.  EXAM: CT ABDOMEN AND PELVIS WITHOUT CONTRAST  TECHNIQUE: Multidetector CT imaging of the abdomen and pelvis was performed following the standard protocol without IV contrast.  COMPARISON:  07/26/2014  FINDINGS: Evaluation of solid organs and vascular structures is limited without IV contrast material. Evaluation of bowel is limited without oral contrast material. Infiltration or atelectasis in both lung bases.  Multiple calcifications in the pancreas suggesting chronic pancreatitis. No acute infiltration or pancreatic ductal dilatation is identified. The unenhanced appearance of the liver, spleen, gallbladder, adrenal glands, kidneys, inferior vena cava, and retroperitoneal lymph nodes is unremarkable. Calcification of the abdominal aorta without aneurysm. Stents in the iliac arteries. Gastric wall appears mildly thickened diffusely. This may be due to under distention or gastritis. Small bowel are decompressed scattered gas and stool in the colon. Partial right hemicolectomy with ileal colonic anastomosis in the right abdomen. No free air or free fluid in the abdomen. Ventral anterior abdominal wall hernia containing fat.  Pelvis: Uterus is surgically absent. Bladder wall is not thickened. Diverticulosis of the sigmoid colon. No evidence of diverticulitis. No free or loculated pelvic fluid collections. No pelvic mass or lymphadenopathy. Mild edema in the mesentery and retroperitoneum is nonspecific. Degenerative changes in the spine and hips. No destructive bone lesions.  IMPRESSION: Calcifications in the pancreas consistent with chronic pancreatitis. Prominence the gastric wall may be due to thickening or normal under distention. Gastritis is  not excluded. No evidence of bowel obstruction. Diverticulosis of sigmoid colon without evidence of diverticulitis. Inferior ventral abdominal wall hernia containing fat.   Electronically Signed   By: Lucienne Capers M.D.   On: 05/09/2015 21:50    EKG: Independently reviewed.  Abnormal findings:  Borderline low voltage   Assessment/Plan Principal Problem:   Nausea vomiting and diarrhea Active Problems:   Anemia   COPD (chronic obstructive pulmonary disease)   Sleep apnea   Controlled diabetes mellitus type 2 with complications   Acute renal failure superimposed on stage 3 chronic kidney disease   PVD, LEIA/LIIA and RCIA pta 7/09- ABIs 0.68 and 0.69 Feb 2014   PUD (peptic ulcer disease) GI bleed in past (2011)   Cirrhosis of liver   Colon cancer, lap chole 2012   GI bleed   Essential hypertension   Hyperlipidemia   Hypokalemia   Increased urinary frequency  Nausea, vomiting, diarrhea and AP: Etiology is not clear. CT-abdomen/pelvis showed chronic pancreatitis and possible gastritis, no diverticulitis. It is likely due to viral gastritis, but need to rule out other possibilities, such as C. difficile colitis. Patient is septic with leukocytosis and hypotension on admission. Hemodynamically stable.  -will admit to tele bed -When necessary Zofran for nausea, morphine for pain -will get Procalcitonin and trend lactic acid levels per sepsis protocol. -IVF: 2L of NS bolus in ED, followed by 75 cc/h (patient has a congestive heart failure, limiting aggressive IV fluids treatment). -Replace electrolytes  Anemia: Hemoglobin stable -Follow-up CBC  COPD (chronic obstructive pulmonary disease): Stable. No signs of acute exacerbation. -When necessary Albuterol nebulizers -Continue Singulair  HTN: -will hold clonidine due to hypotension and sepsis -continue coreg (patient has metoprolol on her medication list, not sure whether she is taking)  OSA: -CPAP  DM-II: Last A1c 8.0 on  06/13/12, not well controled. Patient is taking Lantus and NovoLog at home -will decrease Lantus dose from 8-5 units daily -SSI -Check A1c  AoCKD-III: Baseline Cre is 0.94 on 12/09/14, her Cre is  2.06 and a BUN 32 on admission. Likely due to prerenal secondary to dehydration due to diarrhea. No hydronephrosis by CT abdomen/pelvis - IVF as above - Check FeNa  - Follow up renal function by BMP - Avoid NSAIDs  PVD: LEIA/LIIA and RCIA pta 7/09- ABIs 0.68 and 0.69 Feb 2014 -continue aspirin and Cilostazole  GERD and PUD (peptic ulcer disease): -Switch PPI to  IV Pepcid until C. difficile PCR negative  HLD: Last LDL was 66 on 03/04/14 -Continue home medications: Zocor  Hypokalemia: K= 2.4 on admission. - Repleted - 2 g of magnesium sulfate were given in emergency room - Check Mg level  Increased urinary frequency:  -Check a urinalysis    DVT ppx: SQ Heparin      Code Status: Full code Family Communication: Yes, patient's HPA, Ms. Pennis  at bed side Disposition Plan: Admit to inpatient   Date of Service 05/09/2015    Ivor Costa [facility] Pager [phone]  If 7PM-7AM, please contact night-coverage [email or web address] Password [email or web address] 05/09/2015, 11:11 PM

## 2015-05-09 NOTE — ED Provider Notes (Signed)
CSN: [id number]     Arrival date & time 05/09/15  1732 History   First MD Initiated Contact with Patient 05/09/15 1736     Chief Complaint  Patient presents with  . N/V/D      (Consider location/radiation/quality/duration/timing/severity/associated sxs/prior Treatment) HPI Comments: Patient presents to the emergency department with chief complaint of abdominal pain, nausea, vomiting, and diarrhea 4 days.  She is accompanied by her daughter, who states that she has been drinking more alcohol than normal. Patient states that she has been having diarrhea, but has not been bloody. She does have a history of diverticulitis and diverticular bleed which last year resulted in ICU admission. She denies any fevers, chills, cough, chest pain, shortness of breath. There are no aggravating or alleviating factors.  The history is provided by the patient. No language interpreter was used.    Past Medical History  Diagnosis Date  . Alcoholic cirrhosis   . Reflux esophagitis   . Gastroesophageal reflux   . Colon polyps   . CHF (congestive heart failure) 10/13    grade 1 diastolic dysfunction, Nl LVF  . Hypertension   . History of GI diverticular bleed   . Pneumonia     history of  . Asthma   . Anemia   . Blood transfusion few years ago  . Villous adenoma of colon 05/10/11  . PVD (peripheral vascular disease) 2009    bilat iliac stenting  . Chronic obstructive pulmonary disease (COPD)   . Arthritis   . Glaucoma     left  . Gout   . Acute venous embolism and thrombosis of deep vessels of proximal lower extremity   . Back pain   . Leg swelling   . Diabetes mellitus     insulin dep   . Hepatitis     alcoholic hepatitis  . Depression   . Anxiety   . On home oxygen therapy oxygen 2 liter per minute prn per   . Cellulitis     bilateral lower extremities   Past Surgical History  Procedure Laterality Date  . Cataract extraction Right yrs ago  . Colonoscopy    . Laparoscopic assissted  total colectomy w/ j-pouch  04/22/11  . Ventral hernia repair  06/13/2012    Procedure: LAPAROSCOPIC VENTRAL HERNIA;  Surgeon: Adin Hector, MD;  Location: [facility];  Service: General;  Laterality: N/A;  Laparoscopic Assisted Ventral Hernia with Mesh  . Abdominal hysterectomy  yrs ago  . Esophagogastroduodenoscopy (egd) with propofol N/A 08/06/2013    Procedure: ESOPHAGOGASTRODUODENOSCOPY (EGD) WITH PROPOFOL;  Surgeon: Lear Ng, MD;  Location: [facility];  Service: Endoscopy;  Laterality: N/A;  . Colonoscopy with propofol N/A 08/06/2013    Procedure: COLONOSCOPY WITH PROPOFOL;  Surgeon: Lear Ng, MD;  Location: [facility];  Service: Endoscopy;  Laterality: N/A;   Family History  Problem Relation Age of Onset  . Cancer Brother     heent ca  . Cancer Sister     liver ca   Social History  Substance Use Topics  . Smoking status: Former Smoker -- 0.50 packs/day for 30 years    Types: Cigarettes    Quit date: 11/06/2005  . Smokeless tobacco: Never Used  . Alcohol Use: No     Comment: stopped 2005   OB History    No data available     Review of Systems  Constitutional: Negative for fever and chills.  Respiratory: Negative for shortness of breath.   Cardiovascular: Negative for  chest pain.  Gastrointestinal: Positive for nausea, vomiting and diarrhea. Negative for constipation.  Genitourinary: Negative for dysuria.      Allergies  Penicillins and Lipitor  Home Medications   Prior to Admission medications   Medication Sig Start Date End Date Taking? Authorizing Provider  albuterol (PROVENTIL HFA;VENTOLIN HFA) 108 (90 BASE) MCG/ACT inhaler Inhale 2 puffs into the lungs every 6 (six) hours as needed. For shortness of breath   Yes Historical Provider, MD  albuterol (PROVENTIL) (2.5 MG/3ML) 0.083% nebulizer solution Take 2.5 mg by nebulization every 6 (six) hours as needed for wheezing or shortness of breath.   Yes Historical Provider, MD  allopurinol  (ZYLOPRIM) 100 MG tablet Take 100 mg by mouth 2 (two) times daily.   Yes Historical Provider, MD  aspirin 81 MG EC tablet Take 81 mg by mouth daily.     Yes Historical Provider, MD  carvedilol (COREG) 6.25 MG tablet Take 1 tablet (6.25 mg total) by mouth 2 (two) times daily with a meal. 09/05/14  Yes [name], MD  cilostazol (PLETAL) 100 MG tablet Take 50 mg by mouth 2 (two) times daily before a meal.    Yes Historical Provider, MD  cloNIDine (CATAPRES) 0.1 MG tablet Take 1 tablet (0.1 mg total) by mouth 3 (three) times daily. 09/05/14  Yes [name], MD  ergocalciferol (VITAMIN D2) 50000 UNITS capsule Take 50,000 Units by mouth once a week. On Monday   Yes Historical Provider, MD  fluticasone (FLONASE) 50 MCG/ACT nasal spray Place 2 sprays into the nose daily as needed for allergies.    Yes Historical Provider, MD  glucose monitoring kit (FREESTYLE) monitoring kit 1 each by Does not apply route 4 (four) times daily - after meals and at bedtime. 1 month Diabetic Testing Supplies for QAC-QHS accuchecks.Any brand OK 07/30/14  Yes Thurnell Lose, MD  insulin glargine (LANTUS) 100 UNIT/ML injection Inject 0.08 mLs (8 Units total) into the skin at bedtime. Pen if approved 07/30/14  Yes Thurnell Lose, MD  Insulin Syringe-Needle U-100 25G X 1" 1 ML MISC Any brand, for 4 times a day insulin SQ, 1 month supply. 07/30/14  Yes Thurnell Lose, MD  metoprolol (LOPRESSOR) 50 MG tablet Take 25 mg by mouth 2 (two) times daily. 03/04/15  Yes Historical Provider, MD  montelukast (SINGULAIR) 10 MG tablet Take 10 mg by mouth at bedtime.    Yes Historical Provider, MD  omeprazole (PRILOSEC) 40 MG capsule Take 40 mg by mouth daily.    Yes Historical Provider, MD  OXYGEN-HELIUM IN Inhale into the lungs. 2 liters nightly   Yes Historical Provider, MD  Polyethylene Glycol 400 (BLINK TEARS OP) Place 1 drop into the right eye daily as needed (dryness).    Yes Historical Provider, MD  simvastatin (ZOCOR) 10 MG  tablet Take 10 mg by mouth daily.  11/30/14  Yes Historical Provider, MD  insulin aspart (NOVOLOG) 100 UNIT/ML injection Before each meal 3 times a day, 140-199 - 2 units, 200-250 - 4 units, 251-299 - 6 units,  300-349 - 8 units,  350 or above 10 units. Insulin PEN if approved, provide syringes and needles if needed. Patient not taking: Reported on 05/09/2015 07/30/14   Thurnell Lose, MD   BP 150/83 mmHg  Pulse 89  Temp(Src) 98.4 F (36.9 C) (Oral)  Resp 18  SpO2 98% Physical Exam  Constitutional: She is oriented to person, place, and time. She appears well-developed and well-nourished.  HENT:  Head:  Normocephalic and atraumatic.  Eyes: Conjunctivae and EOM are normal. Pupils are equal, round, and reactive to light.  Neck: Normal range of motion. Neck supple.  Cardiovascular: Normal rate and regular rhythm.  Exam reveals no gallop and no friction rub.   No murmur heard. Pulmonary/Chest: Effort normal and breath sounds normal. No respiratory distress. She has no wheezes. She has no rales. She exhibits no tenderness.  Abdominal: Soft. She exhibits no distension and no mass. There is tenderness. There is no rebound and no guarding.  Moderate upper abdominal tenderness to palpation, no other focal abdominal tenderness  Musculoskeletal: Normal range of motion. She exhibits no edema or tenderness.  Neurological: She is alert and oriented to person, place, and time.  Skin: Skin is warm and dry.  Psychiatric: She has a normal mood and affect. Her behavior is normal. Judgment and thought content normal.  Nursing note and vitals reviewed.   ED Course  Procedures (including critical care time) Results for orders placed or performed during the hospital encounter of 05/09/15  Lipase, blood  Result Value Ref Range   Lipase 18 (L) 22 - 51 U/L  Comprehensive metabolic panel  Result Value Ref Range   Sodium 134 (L) 135 - 145 mmol/L   Potassium 2.4 (LL) 3.5 - 5.1 mmol/L   Chloride 100 (L) 101 -  111 mmol/L   CO2 24 22 - 32 mmol/L   Glucose, Bld 116 (H) 65 - 99 mg/dL   BUN 32 (H) 6 - 20 mg/dL   Creatinine, Ser 2.06 (H) 0.44 - 1.00 mg/dL   Calcium 8.1 (L) 8.9 - 10.3 mg/dL   Total Protein 5.9 (L) 6.5 - 8.1 g/dL   Albumin 2.6 (L) 3.5 - 5.0 g/dL   AST 44 (H) 15 - 41 U/L   ALT 22 14 - 54 U/L   Alkaline Phosphatase 108 38 - 126 U/L   Total Bilirubin 0.5 0.3 - 1.2 mg/dL   GFR calc non Af Amer 23 (L) >60 mL/min   GFR calc Af Amer 27 (L) >60 mL/min   Anion gap 10 5 - 15  CBC  Result Value Ref Range   WBC 13.2 (H) 4.0 - 10.5 K/uL   RBC 3.49 (L) 3.87 - 5.11 MIL/uL   Hemoglobin 9.7 (L) 12.0 - 15.0 g/dL   HCT 29.7 (L) 36.0 - 46.0 %   MCV 85.1 78.0 - 100.0 fL   MCH 27.8 26.0 - 34.0 pg   MCHC 32.7 30.0 - 36.0 g/dL   RDW 14.0 11.5 - 15.5 %   Platelets 191 150 - 400 K/uL  Urinalysis, Routine w reflex microscopic (not at [facility])  Result Value Ref Range   Color, Urine YELLOW YELLOW   APPearance CLOUDY (A) CLEAR   Specific Gravity, Urine 1.010 1.005 - 1.030   pH 6.0 5.0 - 8.0   Glucose, UA NEGATIVE NEGATIVE mg/dL   Hgb urine dipstick SMALL (A) NEGATIVE   Bilirubin Urine NEGATIVE NEGATIVE   Ketones, ur NEGATIVE NEGATIVE mg/dL   Protein, ur NEGATIVE NEGATIVE mg/dL   Urobilinogen, UA 0.2 0.0 - 1.0 mg/dL   Nitrite NEGATIVE NEGATIVE   Leukocytes, UA SMALL (A) NEGATIVE  Ethanol  Result Value Ref Range   Alcohol, Ethyl (B) <5 <5 mg/dL  Urine microscopic-add on  Result Value Ref Range   WBC, UA 7-10 <3 WBC/hpf   RBC / HPF 0-2 <3 RBC/hpf   Bacteria, UA MANY (A) RARE  CBG monitoring, ED  Result Value Ref Range   Glucose-Capillary  113 (H) 65 - 99 mg/dL   Ct Abdomen Pelvis Wo Contrast  05/09/2015   CLINICAL DATA:  Nausea, vomiting, and diarrhea for the last 4 days.  EXAM: CT ABDOMEN AND PELVIS WITHOUT CONTRAST  TECHNIQUE: Multidetector CT imaging of the abdomen and pelvis was performed following the standard protocol without IV contrast.  COMPARISON:  07/26/2014  FINDINGS: Evaluation of  solid organs and vascular structures is limited without IV contrast material. Evaluation of bowel is limited without oral contrast material. Infiltration or atelectasis in both lung bases.  Multiple calcifications in the pancreas suggesting chronic pancreatitis. No acute infiltration or pancreatic ductal dilatation is identified. The unenhanced appearance of the liver, spleen, gallbladder, adrenal glands, kidneys, inferior vena cava, and retroperitoneal lymph nodes is unremarkable. Calcification of the abdominal aorta without aneurysm. Stents in the iliac arteries. Gastric wall appears mildly thickened diffusely. This may be due to under distention or gastritis. Small bowel are decompressed scattered gas and stool in the colon. Partial right hemicolectomy with ileal colonic anastomosis in the right abdomen. No free air or free fluid in the abdomen. Ventral anterior abdominal wall hernia containing fat.  Pelvis: Uterus is surgically absent. Bladder wall is not thickened. Diverticulosis of the sigmoid colon. No evidence of diverticulitis. No free or loculated pelvic fluid collections. No pelvic mass or lymphadenopathy. Mild edema in the mesentery and retroperitoneum is nonspecific. Degenerative changes in the spine and hips. No destructive bone lesions.  IMPRESSION: Calcifications in the pancreas consistent with chronic pancreatitis. Prominence the gastric wall may be due to thickening or normal under distention. Gastritis is not excluded. No evidence of bowel obstruction. Diverticulosis of sigmoid colon without evidence of diverticulitis. Inferior ventral abdominal wall hernia containing fat.   Electronically Signed   By: Lucienne Capers M.D.   On: 05/09/2015 21:50    I have personally reviewed and evaluated these images and lab results as part of my medical decision-making.   EKG Interpretation None      MDM   Final diagnoses:  Nausea vomiting and diarrhea  Hypokalemia    Patient with abdominal  pain, nausea, vomiting, diarrhea. Will check labs, CT scan, treat pain, give fluids, and will reassess.  Notify the patient potassium is 2.4. Will supplement. Likely secondary to diarrhea. We'll continue with fluids. CT pending.  CT negative for diverticulitis. Patient seen by and discussed with Dr. Rex Kras, who recommends admission for hypokalemia and diarrhea. Currently supplementing potassium and magnesium.  Appreciate Dr. Blaine Hamper for admitting the patient.    Montine Circle, PA-C 05/09/15 [place], MD 05/09/15 [phone]

## 2015-05-10 ENCOUNTER — Encounter (HOSPITAL_COMMUNITY): Payer: Self-pay | Admitting: Rehabilitation

## 2015-05-10 DIAGNOSIS — R197 Diarrhea, unspecified: Secondary | ICD-10-CM

## 2015-05-10 DIAGNOSIS — J449 Chronic obstructive pulmonary disease, unspecified: Secondary | ICD-10-CM

## 2015-05-10 DIAGNOSIS — E876 Hypokalemia: Secondary | ICD-10-CM

## 2015-05-10 DIAGNOSIS — R112 Nausea with vomiting, unspecified: Secondary | ICD-10-CM

## 2015-05-10 DIAGNOSIS — E118 Type 2 diabetes mellitus with unspecified complications: Secondary | ICD-10-CM

## 2015-05-10 LAB — COMPREHENSIVE METABOLIC PANEL
ALT: 17 U/L (ref 14–54)
ANION GAP: 9 (ref 5–15)
AST: 30 U/L (ref 15–41)
Albumin: 2.2 g/dL — ABNORMAL LOW (ref 3.5–5.0)
Alkaline Phosphatase: 91 U/L (ref 38–126)
BUN: 26 mg/dL — ABNORMAL HIGH (ref 6–20)
CHLORIDE: 108 mmol/L (ref 101–111)
CO2: 21 mmol/L — AB (ref 22–32)
CREATININE: 1.65 mg/dL — AB (ref 0.44–1.00)
Calcium: 7.6 mg/dL — ABNORMAL LOW (ref 8.9–10.3)
GFR calc non Af Amer: 30 mL/min — ABNORMAL LOW (ref >60)
GFR, EST AFRICAN AMERICAN: 35 mL/min — AB (ref >60)
Glucose, Bld: 96 mg/dL (ref 65–99)
POTASSIUM: 2.6 mmol/L — AB (ref 3.5–5.1)
SODIUM: 138 mmol/L (ref 135–145)
Total Bilirubin: 0.5 mg/dL (ref 0.3–1.2)
Total Protein: 5 g/dL — ABNORMAL LOW (ref 6.5–8.1)

## 2015-05-10 LAB — APTT: APTT: 26 s (ref 24–37)

## 2015-05-10 LAB — CBC
HEMATOCRIT: 28.7 % — AB (ref 36.0–46.0)
HEMOGLOBIN: 9.2 g/dL — AB (ref 12.0–15.0)
MCH: 27.5 pg (ref 26.0–34.0)
MCHC: 32.1 g/dL (ref 30.0–36.0)
MCV: 85.9 fL (ref 78.0–100.0)
Platelets: 193 10*3/uL (ref 150–400)
RBC: 3.34 MIL/uL — AB (ref 3.87–5.11)
RDW: 14.2 % (ref 11.5–15.5)
WBC: 12 10*3/uL — ABNORMAL HIGH (ref 4.0–10.5)

## 2015-05-10 LAB — GLUCOSE, CAPILLARY
GLUCOSE-CAPILLARY: 131 mg/dL — AB (ref 65–99)
GLUCOSE-CAPILLARY: 139 mg/dL — AB (ref 65–99)
GLUCOSE-CAPILLARY: 149 mg/dL — AB (ref 65–99)
GLUCOSE-CAPILLARY: 194 mg/dL — AB (ref 65–99)
GLUCOSE-CAPILLARY: 62 mg/dL — AB (ref 65–99)
Glucose-Capillary: 56 mg/dL — ABNORMAL LOW (ref 65–99)
Glucose-Capillary: 156 mg/dL — ABNORMAL HIGH (ref 65–99)

## 2015-05-10 LAB — PROCALCITONIN: Procalcitonin: <0.1 ng/mL

## 2015-05-10 LAB — PROTIME-INR
INR: 0.97 (ref 0.00–1.49)
PROTHROMBIN TIME: 13.1 s (ref 11.6–15.2)

## 2015-05-10 LAB — LACTIC ACID, PLASMA: Lactic Acid, Venous: 4 mmol/L (ref 0.5–2.0)

## 2015-05-10 LAB — MAGNESIUM: MAGNESIUM: 1.3 mg/dL — AB (ref 1.7–2.4)

## 2015-05-10 MED ORDER — POTASSIUM CHLORIDE 2 MEQ/ML IV SOLN
INTRAVENOUS | Status: DC
  Administered 2015-05-10: 10:00:00 via INTRAVENOUS
  Filled 2015-05-10 (×2): qty 1000

## 2015-05-10 MED ORDER — POTASSIUM CHLORIDE CRYS ER 20 MEQ PO TBCR
40.0000 meq | EXTENDED_RELEASE_TABLET | ORAL | Status: AC
  Administered 2015-05-10 (×3): 40 meq via ORAL
  Filled 2015-05-10 (×3): qty 2

## 2015-05-10 MED ORDER — DEXTROSE 50 % IV SOLN
INTRAVENOUS | Status: AC
  Administered 2015-05-10: 50 mL
  Filled 2015-05-10: qty 50

## 2015-05-10 MED ORDER — DEXTROSE-NACL 5-0.9 % IV SOLN
INTRAVENOUS | Status: DC
  Administered 2015-05-10: 03:00:00 via INTRAVENOUS

## 2015-05-10 MED ORDER — SODIUM CHLORIDE 0.9 % IV SOLN
INTRAVENOUS | Status: DC
  Administered 2015-05-10: 17:00:00 via INTRAVENOUS
  Filled 2015-05-10 (×6): qty 1000

## 2015-05-10 MED ORDER — MAGNESIUM SULFATE 2 GM/50ML IV SOLN
2.0000 g | Freq: Once | INTRAVENOUS | Status: AC
  Administered 2015-05-10: 2 g via INTRAVENOUS
  Filled 2015-05-10: qty 50

## 2015-05-10 MED ORDER — MAGNESIUM SULFATE BOLUS VIA INFUSION: 2.0000 g | Freq: Once | INTRAVENOUS | Status: DC

## 2015-05-10 NOTE — Progress Notes (Signed)
Utilization Review Completed.Sulay Brymer T9/06/2015  

## 2015-05-10 NOTE — Progress Notes (Signed)
[facility] PROGRESS NOTE  [name] KWI:[id number] DOB: 04-30-1946 DOA: 05/09/2015 PCP: Philis Fendt, MD  Assessment/Plan: 1. Gastroenteritis- patient came with nausea vomiting and diarrhea. CT abdomen pelvis showed chronic otitis and possible gastritis no diverticulitis. Lactic acid came back elevated 4.0., Patient currently in no abdominal pain tolerating diet no more nausea vomiting. Lactic acid likely elevated due to dehydration. We'll repeat lactic acid in 6 hrs. 2. COPD- patient currently not in acute exacerbation. Continue when necessary albuterol nebulizer 3. Hypertension- patient was on clonidine at home, currently on hold. Continue Coreg 4. Diabetes mellitus- hemoglobin A1c 8.0 on 06/13/2012. Continue Lantus and NovoLog 5. Acute on CK D stage III- patient's baseline creatinine 0.94, came with creatinine of 2.06 and BUN 32 likely dehydration induced. Continue IV fluids. Check BMP in a.m. 6. Hypokalemia- replace potassium and check BMP in a.m. 7. Peripheral vascular disease- continue aspirin and Pletal 8. Hypomagnesemia- replace magnesium and check mag level in a.m.  Code Status: Full code Family Communication: No family at bedside Disposition Plan: Home when medically stable   Consultants:  None  Procedures:  None  Antibiotics:  None  HPI/Subjective: 69 y.o. female with PMH of peptic ulcer disease, GI bleeding, colitis, asthma, COPD, anemia, PVD, DVT, alcoholic hepatitis, CKD-III, hypertension, hyperlipidemia, diabetes mellitus, GERD, gout, depression, anxiety, who presents with nausea, vomiting, diarrhea and abdominal pain. In ED, patient was found to have lipase 18, WBC 13.2, temperature normal, no tachycardia, positive urinalysis with a small amount of leukocytes, potassium 2.4, worsening renal function. CT-abdomen/pelvis showed calcifications in the pancreas consistent with chronic pancreatitis. Prominence the gastric wall may be due to thickening or  normal under distention, gastritis is not excluded. No evidence of bowel obstruction. Diverticulosis of sigmoid colon without evidence of diverticulitis. Inferior ventral abdominal wall hernia containing fat. Patient is admitted to inpatient for further evaluation and treatment.  Today patient feels better, abdomen is still distended. Patient ambulating in the hallway.  Objective: Filed Vitals:   05/10/15 1400  BP: 145/79  Pulse: 83  Temp: 98.3 F (36.8 C)  Resp: 18    Intake/Output Summary (Last 24 hours) at 05/10/15 1659 Last data filed at 05/10/15 0900  Gross per 24 hour  Intake    480 ml  Output      0 ml  Net    480 ml   Filed Weights   05/09/15 2353 05/10/15 0544  Weight: 55.838 kg (123 lb 1.6 oz) 56.745 kg (125 lb 1.6 oz)    Exam:   General:  Appears in no acute distress  Cardiovascular: S1-S2 is regular  Respiratory: Clear to auscultation bilaterally  Abdomen: Distended, nontender to palpation  Musculoskeletal: No cyanosis/clubbing/edema. Lower extremities  Data Reviewed: Basic Metabolic Panel:  Recent Labs Lab 05/09/15 1900 05/10/15 0602  NA 134* 138  K 2.4* 2.6*  CL 100* 108  CO2 24 21*  GLUCOSE 116* 96  BUN 32* 26*  CREATININE 2.06* 1.65*  CALCIUM 8.1* 7.6*  MG  --  1.3*   Liver Function Tests:  Recent Labs Lab 05/09/15 1900 05/10/15 0602  AST 44* 30  ALT 22 17  ALKPHOS 108 91  BILITOT 0.5 0.5  PROT 5.9* 5.0*  ALBUMIN 2.6* 2.2*    Recent Labs Lab 05/09/15 1900  LIPASE 18*   No results for input(s): AMMONIA in the last 168 hours. CBC:  Recent Labs Lab 05/09/15 1900 05/10/15 0602  WBC 13.2* 12.0*  HGB 9.7* 9.2*  HCT 29.7* 28.7*  MCV 85.1 85.9  PLT 191 193    CBG:  Recent Labs Lab 05/10/15 0206 05/10/15 0808 05/10/15 0922 05/10/15 1204 05/10/15 1613  GLUCAP 139* 56* 131* 194* 149*    No results found for this or any previous visit (from the past 240 hour(s)).   Studies: Ct Abdomen Pelvis Wo  Contrast  05/09/2015   CLINICAL DATA:  Nausea, vomiting, and diarrhea for the last 4 days.  EXAM: CT ABDOMEN AND PELVIS WITHOUT CONTRAST  TECHNIQUE: Multidetector CT imaging of the abdomen and pelvis was performed following the standard protocol without IV contrast.  COMPARISON:  07/26/2014  FINDINGS:   IMPRESSION: Calcifications in the pancreas consistent with chronic pancreatitis. Prominence the gastric wall may be due to thickening or normal under distention. Gastritis is not excluded. No evidence of bowel obstruction. Diverticulosis of sigmoid colon without evidence of diverticulitis. Inferior ventral abdominal wall hernia containing fat.      Scheduled Meds: . allopurinol  100 mg Oral BID  . aspirin EC  81 mg Oral Daily  . carvedilol  6.25 mg Oral BID WC  . cilostazol  50 mg Oral BID AC  . famotidine (PEPCID) IV  20 mg Intravenous Q12H  . heparin  5,000 Units Subcutaneous 3 times per day  . insulin aspart  0-9 Units Subcutaneous TID WC  . insulin glargine  5 Units Subcutaneous QHS  . montelukast  10 mg Oral QHS  . nicotine  21 mg Transdermal Daily  . potassium chloride  40 mEq Oral Once  . potassium chloride  40 mEq Oral Q4H  . simvastatin  10 mg Oral q1800  . [START ON 05/11/2015] Vitamin D (Ergocalciferol)  50,000 Units Oral Weekly   Continuous Infusions: . dextrose 5 % and 0.45% NaCl 1,000 mL with potassium chloride 20 mEq infusion 75 mL/hr at 05/10/15 1024    Principal Problem:   Nausea vomiting and diarrhea Active Problems:   Anemia   COPD (chronic obstructive pulmonary disease)   Sleep apnea   Controlled diabetes mellitus type 2 with complications   Acute renal failure superimposed on stage 3 chronic kidney disease   PVD, LEIA/LIIA and RCIA pta 7/09- ABIs 0.68 and 0.69 Feb 2014   PUD (peptic ulcer disease) GI bleed in past (2011)   Cirrhosis of liver   Colon cancer, lap chole 2012   GI bleed   Essential hypertension   Hyperlipidemia   Hypokalemia   Increased urinary  frequency   Sepsis   Chronic diastolic congestive heart failure    Time spent: 25 min    [facility]  [facility] Pager [phone]. If 7PM-7AM, please contact night-coverage at [email or web address], password [facility] 05/10/2015, 4:59 PM  LOS: 1 day

## 2015-05-10 NOTE — Evaluation (Signed)
Physical Therapy Evaluation Patient Details Name: [name] MRN: [id number] DOB: Jan 25, 1946 Today's Date: 05/10/2015   History of Present Illness  69 yo female admitted with N/V/D. hx of ETOH cirrhosis, CHF, HTN, arthritis, PVD, COPD  Clinical Impression  On eval, pt required Min guard assist for mobility-walked ~200 feet with RW. Pt tolerated activity well. Recommend daily ambulation with nursing supervision/assist.     Follow Up Recommendations Home health PT    Equipment Recommendations  None recommended by PT    Recommendations for Other Services       Precautions / Restrictions Precautions Precautions: Fall Precaution Comments: O2 dep at night Restrictions Weight Bearing Restrictions: No      Mobility  Bed Mobility               General bed mobility comments: pt oob in recliner  Transfers Overall transfer level: Needs assistance Equipment used: Rolling walker (2 wheeled) Transfers: Sit to/from Stand Sit to Stand: Min guard         General transfer comment: close guard for safety. VC safety, hand placement  Ambulation/Gait Ambulation/Gait assistance: Min guard Ambulation Distance (Feet): 200 Feet Assistive device: Rolling walker (2 wheeled) Gait Pattern/deviations: Step-through pattern;Decreased stride length     General Gait Details: slow but steady. o2 sats >90% on RA. dsypnea 2/4.   Stairs            Wheelchair Mobility    Modified Rankin (Stroke Patients Only)       Balance Overall balance assessment: Needs assistance         Standing balance support: During functional activity Standing balance-Leahy Scale: Fair                               Pertinent Vitals/Pain Pain Assessment: No/denies pain    Home Living Family/patient expects to be discharged to:: Private residence Living Arrangements: Alone   Type of Home: Apartment Home Access: Level entry     Home Layout: One level Home Equipment: Walker  - 2 wheels;Cane - single point;Bedside commode;Tub bench;Hand held shower head;Walker - standard;Walker - 4 wheels      Prior Function     Gait / Transfers Assistance Needed: uses quad cane per pt           Hand Dominance        Extremity/Trunk Assessment   Upper Extremity Assessment: Generalized weakness           Lower Extremity Assessment: Generalized weakness      Cervical / Trunk Assessment: Normal  Communication   Communication: No difficulties  Cognition Arousal/Alertness: Awake/alert Behavior During Therapy: WFL for tasks assessed/performed Overall Cognitive Status: Within Functional Limits for tasks assessed                      General Comments      Exercises        Assessment/Plan    PT Assessment Patient needs continued PT services  PT Diagnosis Difficulty walking;Generalized weakness   PT Problem List Decreased strength;Decreased activity tolerance;Decreased balance;Decreased mobility;Decreased knowledge of use of DME  PT Treatment Interventions DME instruction;Gait training;Functional mobility training;Patient/family education;Therapeutic exercise;Balance training   PT Goals (Current goals can be found in the Care Plan section) Acute Rehab PT Goals Patient Stated Goal: home PT Goal Formulation: With patient Time For Goal Achievement: 05/24/15 Potential to Achieve Goals: Good    Frequency Min 3X/week   Barriers to  discharge        Co-evaluation               End of Session Equipment Utilized During Treatment: Gait belt Activity Tolerance: Patient tolerated treatment well Patient left: in chair;with call bell/phone within reach           Time: 2258-3462 PT Time Calculation (min) (ACUTE ONLY): 20 min   Charges:   PT Evaluation $Initial PT Evaluation Tier I: 1 Procedure     PT G Codes:        Weston Anna, MPT Pager: [phone]

## 2015-05-10 NOTE — Progress Notes (Signed)
0040 FSBS 62. Pt was asymptomatic. NPO. Hypoglycemic protocol implemented. Retake FSBS 139.

## 2015-05-11 ENCOUNTER — Inpatient Hospital Stay (HOSPITAL_COMMUNITY): Payer: Medicare Other

## 2015-05-11 DIAGNOSIS — N179 Acute kidney failure, unspecified: Secondary | ICD-10-CM

## 2015-05-11 DIAGNOSIS — N183 Chronic kidney disease, stage 3 (moderate): Secondary | ICD-10-CM

## 2015-05-11 DIAGNOSIS — I10 Essential (primary) hypertension: Secondary | ICD-10-CM

## 2015-05-11 LAB — GLUCOSE, CAPILLARY
GLUCOSE-CAPILLARY: 27 mg/dL — AB (ref 65–99)
GLUCOSE-CAPILLARY: 28 mg/dL — AB (ref 65–99)
GLUCOSE-CAPILLARY: 59 mg/dL — AB (ref 65–99)
Glucose-Capillary: 124 mg/dL — ABNORMAL HIGH (ref 65–99)
Glucose-Capillary: 99 mg/dL (ref 65–99)
Glucose-Capillary: 116 mg/dL — ABNORMAL HIGH (ref 65–99)

## 2015-05-11 LAB — COMPREHENSIVE METABOLIC PANEL
ALBUMIN: 2.6 g/dL — AB (ref 3.5–5.0)
ALT: 23 U/L (ref 14–54)
ANION GAP: 6 (ref 5–15)
AST: 47 U/L — ABNORMAL HIGH (ref 15–41)
Alkaline Phosphatase: 113 U/L (ref 38–126)
BILIRUBIN TOTAL: 0.8 mg/dL (ref 0.3–1.2)
BUN: 19 mg/dL (ref 6–20)
CALCIUM: 8.4 mg/dL — AB (ref 8.9–10.3)
CO2: 19 mmol/L — ABNORMAL LOW (ref 22–32)
Chloride: 111 mmol/L (ref 101–111)
Creatinine, Ser: 1.37 mg/dL — ABNORMAL HIGH (ref 0.44–1.00)
GFR calc non Af Amer: 38 mL/min — ABNORMAL LOW (ref >60)
GFR, EST AFRICAN AMERICAN: 44 mL/min — AB (ref >60)
GLUCOSE: 76 mg/dL (ref 65–99)
POTASSIUM: 5.9 mmol/L — AB (ref 3.5–5.1)
Sodium: 136 mmol/L (ref 135–145)
TOTAL PROTEIN: 6 g/dL — AB (ref 6.5–8.1)

## 2015-05-11 LAB — LACTIC ACID, PLASMA: Lactic Acid, Venous: 1.6 mmol/L (ref 0.5–2.0)

## 2015-05-11 LAB — CBC
HEMATOCRIT: 34.4 % — AB (ref 36.0–46.0)
HEMOGLOBIN: 11 g/dL — AB (ref 12.0–15.0)
MCH: 28 pg (ref 26.0–34.0)
MCHC: 32 g/dL (ref 30.0–36.0)
MCV: 87.5 fL (ref 78.0–100.0)
Platelets: 255 10*3/uL (ref 150–400)
RBC: 3.93 MIL/uL (ref 3.87–5.11)
RDW: 14.2 % (ref 11.5–15.5)
WBC: 10.1 10*3/uL (ref 4.0–10.5)

## 2015-05-11 LAB — HEMOGLOBIN A1C
HEMOGLOBIN A1C: 7.2 % — AB (ref 4.8–5.6)
MEAN PLASMA GLUCOSE: 160 mg/dL

## 2015-05-11 MED ORDER — CLONIDINE HCL 0.1 MG PO TABS
0.1000 mg | ORAL_TABLET | Freq: Three times a day (TID) | ORAL | Status: DC
  Administered 2015-05-11 – 2015-05-12 (×3): 0.1 mg via ORAL
  Filled 2015-05-11 (×5): qty 1

## 2015-05-11 MED ORDER — SODIUM POLYSTYRENE SULFONATE 15 GM/60ML PO SUSP
15.0000 g | Freq: Once | ORAL | Status: AC
Start: 2015-05-11 — End: 2015-05-11
  Administered 2015-05-11: 15 g via ORAL
  Filled 2015-05-11: qty 60

## 2015-05-11 MED ORDER — FAMOTIDINE 20 MG PO TABS
20.0000 mg | ORAL_TABLET | Freq: Two times a day (BID) | ORAL | Status: DC
  Administered 2015-05-11 – 2015-05-12 (×3): 20 mg via ORAL
  Filled 2015-05-11 (×4): qty 1

## 2015-05-11 NOTE — Progress Notes (Signed)
CBG: 28  Treatment: 15 GM carbohydrate snack (Pt has no IV and refuses to have one inserted. MD aware.)  Symptoms: None  Follow-up CBG: [id number]  Result: 59  Possible Reasons for Event: Unknown  Comments/MD notified: Dr Darrick Meigs notified at [phone]. Called back at 0913. Aware of patient's blood sugar, refusal of IV, and RN instructed to try orange juice and breakfast and recheck.   Patient's blood sugar still low after having orange juice. Patient nonsymptomatic. Walking around room with no dizziness, chills, or nausea. Encouraged patient to finish her breakfast and brought her more juice. Will monitor.

## 2015-05-11 NOTE — Progress Notes (Signed)
OT Cancellation Note  Patient Details Name: [name] MRN: [id number] DOB: Jun 09, 1946   Cancelled Treatment:    Reason Eval/Treat Not Completed: Fatigue/lethargy limiting ability to participate Pt just back to bed and ask OT to return next day.  Mickel Baas [place], [place] 05/11/2015, 1:36 PM

## 2015-05-11 NOTE — Care Management Important Message (Signed)
Important Message  Patient Details  Name: VICENTA OLDS MRN: [id number] Date of Birth: Feb 15, 1946   Medicare Important Message Given:  [facility] notification given    Camillo Flaming 05/11/2015, 12:48 [place] Message  Patient Details  Name: DEJANAY WAMBOLDT MRN: [id number] Date of Birth: [birth year]   Medicare Important Message Given:  Yes-second notification given    Camillo Flaming 05/11/2015, 12:48 PM

## 2015-05-11 NOTE — Progress Notes (Signed)
Pt refused nocturnal CPAP.  Pt states that she does not wear CPAP at home and only wears a nasal cannula at 2 Lpm while sleeping.  Pt placed on 2 Lpm Nasal Cannula.  Pt resting comfortably.

## 2015-05-11 NOTE — Progress Notes (Signed)
Patient pulled IV out to right hand. IV site to Right AC does not work. Patient is difficult stick. Refused to let IV Team reinsert IV.

## 2015-05-11 NOTE — Progress Notes (Signed)
PT Cancellation Note  Patient Details Name: [name] MRN: [id number] DOB: 1946-06-20   Cancelled Treatment:    Reason Eval/Treat Not Completed: Medical issues which prohibited therapy (CBG 59 Will follow. )   Philomena Doheny 05/11/2015, 12:39 PM [phone]

## 2015-05-11 NOTE — Progress Notes (Signed)
[facility] PROGRESS NOTE  [name] QQI:[id number] DOB: Sep 24, 1945 DOA: 05/09/2015 PCP: Philis Fendt, MD  Assessment/Plan: 1. Gastroenteritis- resolved, patient came with nausea vomiting and diarrhea. CT abdomen pelvis showed chronic otitis and possible gastritis no diverticulitis. Lactic acid came back elevated 4.0., Patient currently in no abdominal pain tolerating diet no more nausea vomiting. Lactic acid was likely elevated due to dehydration. Repeat lactic acid level this morning is 1.6. Abdominal x-ray showed no acute abnormality. Will advance the diet as tolerated. 2. Hyperkalemia- today potassium is elevated 5.9, will give 1 dose of Kayexalate and check BMP in a.m. 3. COPD- patient currently not in acute exacerbation. Continue when necessary albuterol nebulizer 4. Hypertension- patient was on clonidine at home, will resume the clonidine. Continue Coreg 5. Diabetes mellitus- hemoglobin A1c 8.0 on 06/13/2012. Continue Lantus and NovoLog 6. Acute on CK D stage III- patient's baseline creatinine 0.94, came with creatinine of 2.06 and BUN 32 likely dehydration induced. Continue IV fluids. This morning BUN 19 with creatinine 1.37. 7. Peripheral vascular disease- continue aspirin and Pletal 8. Hypomagnesemia- magnesium replaced, will check magnesium level in a.m.  Code Status: Full code Family Communication: No family at bedside Disposition Plan: Home when medically stable   Consultants:  None  Procedures:  None  Antibiotics:  None  HPI/Subjective: 69 y.o. female with PMH of peptic ulcer disease, GI bleeding, colitis, asthma, COPD, anemia, PVD, DVT, alcoholic hepatitis, CKD-III, hypertension, hyperlipidemia, diabetes mellitus, GERD, gout, depression, anxiety, who presents with nausea, vomiting, diarrhea and abdominal pain. In ED, patient was found to have lipase 18, WBC 13.2, temperature normal, no tachycardia, positive urinalysis with a small amount of leukocytes,  potassium 2.4, worsening renal function. CT-abdomen/pelvis showed calcifications in the pancreas consistent with chronic pancreatitis. Prominence the gastric wall may be due to thickening or normal under distention, gastritis is not excluded. No evidence of bowel obstruction. Diverticulosis of sigmoid colon without evidence of diverticulitis. Inferior ventral abdominal wall hernia containing fat. Patient is admitted to inpatient for further evaluation and treatment.  Today patient feels better, she is tolerating the liquid diet.  Objective: Filed Vitals:   05/11/15 0653  BP: 153/59  Pulse: 71  Temp: 97.3 F (36.3 C)  Resp: 20    Intake/Output Summary (Last 24 hours) at 05/11/15 1218 Last data filed at 05/11/15 0900  Gross per 24 hour  Intake    900 ml  Output      0 ml  Net    900 ml   Filed Weights   05/09/15 2353 05/10/15 0544 05/11/15 0653  Weight: 55.838 kg (123 lb 1.6 oz) 56.745 kg (125 lb 1.6 oz) 58.4 kg (128 lb 12 oz)    Exam:   General:  Appears in no acute distress  Cardiovascular: S1-S2 is regular  Respiratory: Clear to auscultation bilaterally  Abdomen: Distended, nontender to palpation  Musculoskeletal: No cyanosis/clubbing/edema. Lower extremities  Data Reviewed: Basic Metabolic Panel:  Recent Labs Lab 05/09/15 1900 05/10/15 0602 05/11/15 0935  NA 134* 138 136  K 2.4* 2.6* 5.9*  CL 100* 108 111  CO2 24 21* 19*  GLUCOSE 116* 96 76  BUN 32* 26* 19  CREATININE 2.06* 1.65* 1.37*  CALCIUM 8.1* 7.6* 8.4*  MG  --  1.3*  --    Liver Function Tests:  Recent Labs Lab 05/09/15 1900 05/10/15 0602 05/11/15 0935  AST 44* 30 47*  ALT '22 17 23  '$ ALKPHOS 108 91 113  BILITOT 0.5 0.5 0.8  PROT 5.9* 5.0* 6.0*  ALBUMIN 2.6* 2.2* 2.6*    Recent Labs Lab 05/09/15 1900  LIPASE 18*   No results for input(s): AMMONIA in the last 168 hours. CBC:  Recent Labs Lab 05/09/15 1900 05/10/15 0602 05/11/15 0935  WBC 13.2* 12.0* 10.1  HGB 9.7* 9.2* 11.0*   HCT 29.7* 28.7* 34.4*  MCV 85.1 85.9 87.5  PLT 191 193 255    CBG:  Recent Labs Lab 05/10/15 1613 05/10/15 2203 05/11/15 0904 05/11/15 0906 05/11/15 0957  GLUCAP 149* 156* 28* 27* 59*    No results found for this or any previous visit (from the past 240 hour(s)).   Studies: Ct Abdomen Pelvis Wo Contrast  05/09/2015   CLINICAL DATA:  Nausea, vomiting, and diarrhea for the last 4 days.  EXAM: CT ABDOMEN AND PELVIS WITHOUT CONTRAST  TECHNIQUE: Multidetector CT imaging of the abdomen and pelvis was performed following the standard protocol without IV contrast.  COMPARISON:  07/26/2014  FINDINGS:   IMPRESSION: Calcifications in the pancreas consistent with chronic pancreatitis. Prominence the gastric wall may be due to thickening or normal under distention. Gastritis is not excluded. No evidence of bowel obstruction. Diverticulosis of sigmoid colon without evidence of diverticulitis. Inferior ventral abdominal wall hernia containing fat.      Scheduled Meds: . allopurinol  100 mg Oral BID  . aspirin EC  81 mg Oral Daily  . carvedilol  6.25 mg Oral BID WC  . cilostazol  50 mg Oral BID AC  . famotidine  20 mg Oral BID  . heparin  5,000 Units Subcutaneous 3 times per day  . insulin aspart  0-9 Units Subcutaneous TID WC  . insulin glargine  5 Units Subcutaneous QHS  . montelukast  10 mg Oral QHS  . nicotine  21 mg Transdermal Daily  . potassium chloride  40 mEq Oral Once  . simvastatin  10 mg Oral q1800  . sodium polystyrene  15 g Oral Once  . Vitamin D (Ergocalciferol)  50,000 Units Oral Weekly   Continuous Infusions: . sodium chloride 0.9 % 1,000 mL infusion 100 mL/hr at 05/10/15 1715    Principal Problem:   Nausea vomiting and diarrhea Active Problems:   Anemia   COPD (chronic obstructive pulmonary disease)   Sleep apnea   Controlled diabetes mellitus type 2 with complications   Acute renal failure superimposed on stage 3 chronic kidney disease   PVD, LEIA/LIIA and  RCIA pta 7/09- ABIs 0.68 and 0.69 Feb 2014   PUD (peptic ulcer disease) GI bleed in past (2011)   Cirrhosis of liver   Colon cancer, lap chole 2012   GI bleed   Essential hypertension   Hyperlipidemia   Hypokalemia   Increased urinary frequency   Sepsis   Chronic diastolic congestive heart failure    Time spent: 25 min    [facility]  [facility] Pager [phone]. If 7PM-7AM, please contact night-coverage at [email or web address], password [facility] 05/11/2015, 12:18 PM  LOS: 2 days

## 2015-05-11 NOTE — Progress Notes (Signed)
Patient refused to wear CPAP. RT will continue to monitor 

## 2015-05-12 LAB — BASIC METABOLIC PANEL
ANION GAP: 4 — AB (ref 5–15)
BUN: 15 mg/dL (ref 6–20)
CALCIUM: 8.2 mg/dL — AB (ref 8.9–10.3)
CO2: 23 mmol/L (ref 22–32)
Chloride: 110 mmol/L (ref 101–111)
Creatinine, Ser: 1.15 mg/dL — ABNORMAL HIGH (ref 0.44–1.00)
GFR, EST AFRICAN AMERICAN: 55 mL/min — AB (ref >60)
GFR, EST NON AFRICAN AMERICAN: 47 mL/min — AB (ref >60)
GLUCOSE: 27 mg/dL — AB (ref 65–99)
Potassium: 4.8 mmol/L (ref 3.5–5.1)
Sodium: 137 mmol/L (ref 135–145)

## 2015-05-12 LAB — GLUCOSE, CAPILLARY
Glucose-Capillary: 25 mg/dL — CL (ref 65–99)
Glucose-Capillary: 106 mg/dL — ABNORMAL HIGH (ref 65–99)
Glucose-Capillary: 91 mg/dL (ref 65–99)

## 2015-05-12 LAB — MAGNESIUM: MAGNESIUM: 1.2 mg/dL — AB (ref 1.7–2.4)

## 2015-05-12 MED ORDER — MAGNESIUM SULFATE 2 GM/50ML IV SOLN
2.0000 g | Freq: Once | INTRAVENOUS | Status: AC
  Administered 2015-05-12: 2 g via INTRAVENOUS
  Filled 2015-05-12: qty 50

## 2015-05-12 MED ORDER — MAGNESIUM OXIDE 250 MG PO TABS: 250.0000 mg | ORAL_TABLET | Freq: Every day | ORAL | Status: DC

## 2015-05-12 NOTE — Evaluation (Signed)
Occupational Therapy Evaluation Patient Details Name: [name] MRN: [id number] DOB: 1946/05/06 Today's Date: 05/12/2015    History of Present Illness 69 yo female admitted with N/V/D. hx of ETOH cirrhosis, CHF, HTN, arthritis, PVD, COPD   Clinical Impression   Pt admitted with above.  She presents to OT with decreased safety awareness, and impaired balance as well as generalized weakness. She requires min guard assist for ADLs.  She is planning to discharge home today.  All further OT needs deferred to [facility].   Acute OT will sign off at this time.     Follow Up Recommendations  Home health OT;Supervision/Assistance - 24 hour    Equipment Recommendations  None recommended by OT    Recommendations for Other Services       Precautions / Restrictions Precautions Precautions: Fall Precaution Comments: O2 dep at night      Mobility Bed Mobility Overal bed mobility: Independent                Transfers Overall transfer level: Needs assistance   Transfers: Sit to/from Stand;Stand Pivot Transfers Sit to Stand: Min guard Stand pivot transfers: Min guard       General transfer comment: wide BOS, min guard for balance     Balance Overall balance assessment: Needs assistance Sitting-balance support: Feet supported Sitting balance-Leahy Scale: Good     Standing balance support: During functional activity Standing balance-Leahy Scale: Fair                              ADL Overall ADL's : Needs assistance/impaired Eating/Feeding: Independent   Grooming: Wash/dry hands;Wash/dry face;Oral care;Min guard;Standing   Upper Body Bathing: Set up;Sitting   Lower Body Bathing: Min guard;Sit to/from stand   Upper Body Dressing : Set up;Sitting   Lower Body Dressing: Min guard;Sit to/from stand   Toilet Transfer: Min guard;Ambulation;Comfort height toilet   Toileting- Clothing Manipulation and Hygiene: Min guard;Sit to/from stand        Functional mobility during ADLs: Min guard;Cane General ADL Comments: Pt self distracts frequently      Vision     Perception     Praxis      Pertinent Vitals/Pain       Hand Dominance Right   Extremity/Trunk Assessment Upper Extremity Assessment Upper Extremity Assessment: Generalized weakness   Lower Extremity Assessment Lower Extremity Assessment: Defer to PT evaluation   Cervical / Trunk Assessment Cervical / Trunk Assessment: Normal   Communication Communication Communication: No difficulties   Cognition Arousal/Alertness: Awake/alert Behavior During Therapy: WFL for tasks assessed/performed Overall Cognitive Status: No family/caregiver present to determine baseline cognitive functioning                     General Comments       Exercises       Shoulder Instructions      Home Living Family/patient expects to be discharged to:: Private residence Living Arrangements: Alone Available Help at Discharge: Personal care attendant Type of Home: Apartment Home Access: Level entry     Home Layout: One level     Bathroom Shower/Tub: Tub/shower unit Shower/tub characteristics: Architectural technologist: Standard     Home Equipment: Environmental consultant - 2 wheels;Cane - single point;Bedside commode;Tub bench;Hand held shower head;Walker - standard;Walker - 4 wheels          Prior Functioning/Environment Level of Independence: Needs assistance  Gait / Transfers Assistance Needed: uses quad cane per  pt ADL's / Homemaking Assistance Needed: requires assist for IADLs         OT Diagnosis: Generalized weakness;Cognitive deficits   OT Problem List: Decreased strength;Decreased activity tolerance;Impaired balance (sitting and/or standing);Decreased cognition   OT Treatment/Interventions:      OT Goals(Current goals can be found in the care plan section) Acute Rehab OT Goals Patient Stated Goal: go home   OT Frequency:     Barriers to D/C:             Co-evaluation              End of Session Nurse Communication: Mobility status  Activity Tolerance: Patient tolerated treatment well Patient left: in bed;with call bell/phone within reach;with bed alarm set   Time: 1420-1453 OT Time Calculation (min): 33 min Charges:  OT General Charges $OT Visit: 1 Procedure OT Evaluation $Initial OT Evaluation Tier I: 1 Procedure OT Treatments $Self Care/Home Management : 8-22 mins G-Codes:    Donye Dauenhauer M 05-25-2015, 3:05 PM

## 2015-05-12 NOTE — Progress Notes (Signed)
CRITICAL VALUE ALERT  Critical value received:  Glucose 27  Date of notification:  05/12/15  Time of notification:  0715  Critical value read back:Yes.    Nurse who received alert:  c Effie Janoski rn  MD notified (1st page):  lama  Time of first page:  0810  MD notified (2nd page):  Time of second page:  Responding MD:  Darrick Meigs  Time MD responded:  [phone]

## 2015-05-12 NOTE — Progress Notes (Signed)
05/12/15 approximately 0715 notified by lab of critical glucose 27. Juice given. Repeat CBG 25. Second juice given and MD paged. New orders given and followed.

## 2015-05-12 NOTE — Care Management Note (Signed)
Case Management Note  Patient Details  Name: [name] MRN: [id number] Date of Birth: December 19, 1945  Subjective/Objective:                   1. nausea vomiting and diarrhea. Action/Plan:  Discharge planning Expected Discharge Date:  05/12/15               Expected Discharge Plan:  [place]  In-House Referral:     Discharge planning Services  CM Consult  Post Acute Care Choice:  Home Health Choice offered to:  Patient  DME Arranged:    DME Agency:     HH Arranged:  PT, OT HH Agency:  [place]  Status of Service:  Completed, signed off  Medicare Important Message Given:  Yes-second notification given Date Medicare IM Given:    Medicare IM give by:    Date Additional Medicare IM Given:    Additional Medicare Important Message give by:     If discussed at [place] of Stay Meetings, dates discussed:    Additional Comments: CM met with pt to offer choice and pt states she cannot remember who she had last but wants them again.  CM made calls to [facility] rendered services to pt in January 2016.  Cm made pt aware and she states she would like [facility] again.  Referral called to [employer], Tim.  No other CM needs were communicated. Dellie Catholic, RN 05/12/2015, 2:38 PM

## 2015-05-12 NOTE — Progress Notes (Signed)
Pt discharged home. Discharge instructions given. Pt verbalized understanding. VSS. Pt escorted to main entrance via [facility] with personal belongings by Andee Poles NT to awaiting private vehicle.

## 2015-05-12 NOTE — Discharge Summary (Signed)
Physician Discharge Summary  [name] CBS:[id number] DOB: 1946/03/28 DOA: 05/09/2015  PCP: Philis Fendt, MD  Admit date: 05/09/2015 Discharge date: 05/12/2015  Time spent: 25 * minutes  Recommendations for Outpatient Follow-up:  1. *Follow up PCP in 2 weeks  Discharge Diagnoses:  Principal Problem:   Nausea vomiting and diarrhea Active Problems:   Anemia   COPD (chronic obstructive pulmonary disease)   Sleep apnea   Controlled diabetes mellitus type 2 with complications   Acute renal failure superimposed on stage 3 chronic kidney disease   PVD, LEIA/LIIA and RCIA pta 7/09- ABIs 0.68 and 0.69 Feb 2014   PUD (peptic ulcer disease) GI bleed in past (2011)   Cirrhosis of liver   Colon cancer, lap chole 2012   GI bleed   Essential hypertension   Hyperlipidemia   Hypokalemia   Increased urinary frequency   Sepsis   Chronic diastolic congestive heart failure   Discharge Condition: Stable  Diet recommendation: regular diet  Filed Weights   05/10/15 0544 05/11/15 0653 05/12/15 0509  Weight: 56.745 kg (125 lb 1.6 oz) 58.4 kg (128 lb 12 oz) 56.2 kg (123 lb 14.4 oz)    History of present illness:  69 y.o. female with PMH of peptic ulcer disease, GI bleeding, colitis, asthma, COPD, anemia, PVD, DVT, alcoholic hepatitis, CKD-III, hypertension, hyperlipidemia, diabetes mellitus, GERD, gout, depression, anxiety, who presents with nausea, vomiting, diarrhea and abdominal pain.nausea, vomiting, diarrhea 3 weeks ago, which lasted for 3 days and then resolved. She has been doing fine until 4 days ago, when she started having similar symptoms again, including nausea, vomiting, diarrhea and abdominal pain. She vomited 4 times and had 4 bowel movements with loose stool today. She has diffused moderate abdominal pain, which is constant and nonradiating. She also has abdominal distention. CT-abdomen/pelvis showed calcifications in the pancreas consistent with chronic pancreatitis.  Prominence the gastric wall may be due to thickening or normal under distention, gastritis is not excluded. No evidence of bowel obstruction. Diverticulosis of sigmoid colon without evidence of diverticulitis. Inferior ventral abdominal wall hernia containing fat. Patient is admitted to inpatient for further evaluation and treatment.   Hospital Course:  1. Gastroenteritis- resolved, patient presented with nausea vomiting and diarrhea. CT abdomen pelvis showed chronic pancreatitis and possible gastritis no diverticulitis. Lactic acid came back elevated 4.0., Patient during her hospital stay was completely asymptomatic did not have any abdominal pain tolerating liquid diet. Lactic acid was likely elevated due to dehydration. Repeat lactic acid level yesterday morning was 1.6. Abdominal x-ray showed no acute abnormality. Patient had one loose bowel movement today after she ate regular food which patient says is normal for her as she always did loose bowel movement after eating food.  but no nausea or vomiting and she tolerated the food well. She only had one loose bowel movement today during her entire hospital stay.  2. Hyperkalemia- potassium was elevated 5.9, will give 1 dose of Kayexalate was given and this morning potassium is 4.8.  3. COPD- patient currently not in acute exacerbation. Continue when necessary albuterol nebulizer 4. Hypertension- patient was on clonidine at home, will resume the clonidine. Continue Coreg 5. Diabetes mellitus- hemoglobin A1c 8.0 on 06/13/2012. Continue Lantus and NovoLog 6. Acute on CK D stage III- patient's baseline creatinine 0.94, came with creatinine of 2.06 and BUN 32 likely dehydration induced. Continue IV fluids. This morning BUN and creatinine is 15/1.15.  7. Peripheral vascular disease- continue aspirin and Pletal 8. Hypomagnesemia- magnesium level is 1.2,  will replace magnesium before discharge. One dose of mag sulfate 2 g IV will be given before discharge. We  will also send on daily magnesium oxide as outpatient.  9.  Hypoglycemia- patient had episodes of hypoglycemia in the hospital after she was put on the liquid diet, we have advanced her diet today patient ate regular food with no comp medications. Will check the sugar before discharge.  Procedures:  None   Consultations:   Filed Vitals:   05/12/15 0509  BP: 135/73  Pulse: 80  Temp: 97.4 F (36.3 C)  Resp: 20    General: Appears in no acute distress  Cardiovascular: S1-S2 regular  Respiratory: Clear to auscultation bilaterally   abdomen- soft, nontender, no organomegaly, mild distention.  Discharge Instructions   Discharge Instructions    Diet - low sodium heart healthy    Complete by:  As directed      Increase activity slowly    Complete by:  As directed           Current Discharge Medication List    CONTINUE these medications which have NOT CHANGED   Details  albuterol (PROVENTIL HFA;VENTOLIN HFA) 108 (90 BASE) MCG/ACT inhaler Inhale 2 puffs into the lungs every 6 (six) hours as needed. For shortness of breath    albuterol (PROVENTIL) (2.5 MG/3ML) 0.083% nebulizer solution Take 2.5 mg by nebulization every 6 (six) hours as needed for wheezing or shortness of breath.    allopurinol (ZYLOPRIM) 100 MG tablet Take 100 mg by mouth 2 (two) times daily.    aspirin 81 MG EC tablet Take 81 mg by mouth daily.      carvedilol (COREG) 6.25 MG tablet Take 1 tablet (6.25 mg total) by mouth 2 (two) times daily with a meal. Qty: 60 tablet, Refills: 11    cilostazol (PLETAL) 100 MG tablet Take 50 mg by mouth 2 (two) times daily before a meal.     cloNIDine (CATAPRES) 0.1 MG tablet Take 1 tablet (0.1 mg total) by mouth 3 (three) times daily. Qty: 90 tablet, Refills: 11    ergocalciferol (VITAMIN D2) 50000 UNITS capsule Take 50,000 Units by mouth once a week. On Monday    fluticasone (FLONASE) 50 MCG/ACT nasal spray Place 2 sprays into the nose daily as needed for allergies.      glucose monitoring kit (FREESTYLE) monitoring kit 1 each by Does not apply route 4 (four) times daily - after meals and at bedtime. 1 month Diabetic Testing Supplies for QAC-QHS accuchecks.Any brand OK Qty: 1 each, Refills: 1    insulin glargine (LANTUS) 100 UNIT/ML injection Inject 0.08 mLs (8 Units total) into the skin at bedtime. Pen if approved Qty: 10 mL, Refills: 11    Insulin Syringe-Needle U-100 25G X 1" 1 ML MISC Any brand, for 4 times a day insulin SQ, 1 month supply. Qty: 30 each, Refills: 0    metoprolol (LOPRESSOR) 50 MG tablet Take 25 mg by mouth 2 (two) times daily. Refills: 0    montelukast (SINGULAIR) 10 MG tablet Take 10 mg by mouth at bedtime.     omeprazole (PRILOSEC) 40 MG capsule Take 40 mg by mouth daily.     OXYGEN-HELIUM IN Inhale into the lungs. 2 liters nightly    Polyethylene Glycol 400 (BLINK TEARS OP) Place 1 drop into the right eye daily as needed (dryness).     simvastatin (ZOCOR) 10 MG tablet Take 10 mg by mouth daily.     insulin aspart (NOVOLOG) 100 UNIT/ML injection  Before each meal 3 times a day, 140-199 - 2 units, 200-250 - 4 units, 251-299 - 6 units,  300-349 - 8 units,  350 or above 10 units. Insulin PEN if approved, provide syringes and needles if needed. Qty: 10 mL, Refills: 11       Allergies  Allergen Reactions  . Penicillins Hives  . Lipitor [Atorvastatin] Swelling      The results of significant diagnostics from this hospitalization (including imaging, microbiology, ancillary and laboratory) are listed below for reference.    Significant Diagnostic Studies: Ct Abdomen Pelvis Wo Contrast  05/09/2015   CLINICAL DATA:  Nausea, vomiting, and diarrhea for the last 4 days.  EXAM: CT ABDOMEN AND PELVIS WITHOUT CONTRAST  TECHNIQUE: Multidetector CT imaging of the abdomen and pelvis was performed following the standard protocol without IV contrast.  COMPARISON:  07/26/2014  FINDINGS: Evaluation of solid organs and vascular structures  is limited without IV contrast material. Evaluation of bowel is limited without oral contrast material. Infiltration or atelectasis in both lung bases.  Multiple calcifications in the pancreas suggesting chronic pancreatitis. No acute infiltration or pancreatic ductal dilatation is identified. The unenhanced appearance of the liver, spleen, gallbladder, adrenal glands, kidneys, inferior vena cava, and retroperitoneal lymph nodes is unremarkable. Calcification of the abdominal aorta without aneurysm. Stents in the iliac arteries. Gastric wall appears mildly thickened diffusely. This may be due to under distention or gastritis. Small bowel are decompressed scattered gas and stool in the colon. Partial right hemicolectomy with ileal colonic anastomosis in the right abdomen. No free air or free fluid in the abdomen. Ventral anterior abdominal wall hernia containing fat.  Pelvis: Uterus is surgically absent. Bladder wall is not thickened. Diverticulosis of the sigmoid colon. No evidence of diverticulitis. No free or loculated pelvic fluid collections. No pelvic mass or lymphadenopathy. Mild edema in the mesentery and retroperitoneum is nonspecific. Degenerative changes in the spine and hips. No destructive bone lesions.  IMPRESSION: Calcifications in the pancreas consistent with chronic pancreatitis. Prominence the gastric wall may be due to thickening or normal under distention. Gastritis is not excluded. No evidence of bowel obstruction. Diverticulosis of sigmoid colon without evidence of diverticulitis. Inferior ventral abdominal wall hernia containing fat.   Electronically Signed   By: Lucienne Capers M.D.   On: 05/09/2015 21:50   Dg Abd 2 Views  05/11/2015   CLINICAL DATA:  Abdominal distention. Prior abdominal hernia repair.  EXAM: ABDOMEN - 2 VIEW  COMPARISON:  05/09/2015 CT  FINDINGS: Nonobstructive bowel gas pattern. No free air organomegaly. Calcifications in the region of the pancreas as seen on prior CT  compatible with chronic pancreatitis. Vascular stents in the iliac vessels. No acute bony abnormality.  Small left pleural effusion. Visualized lung bases are otherwise clear.  IMPRESSION: No evidence of bowel obstruction or free air.  Pancreatic calcifications compatible with chronic pancreatitis.  Small left effusion.   Electronically Signed   By: Rolm Baptise M.D.   On: 05/11/2015 11:57    Microbiology: No results found for this or any previous visit (from the past 240 hour(s)).   Labs: Basic Metabolic Panel:  Recent Labs Lab 05/09/15 1900 05/10/15 0602 05/11/15 0935 05/12/15 0615  NA 134* 138 136 137  K 2.4* 2.6* 5.9* 4.8  CL 100* 108 111 110  CO2 24 21* 19* 23  GLUCOSE 116* 96 76 27*  BUN 32* 26* 19 15  CREATININE 2.06* 1.65* 1.37* 1.15*  CALCIUM 8.1* 7.6* 8.4* 8.2*  MG  --  1.3*  --  1.2*   Liver Function Tests:  Recent Labs Lab 05/09/15 1900 05/10/15 0602 05/11/15 0935  AST 44* 30 47*  ALT _0 ALKPHOS 108 91 113  BILITOT 0.5 0.5 0.8  PROT 5.9* 5.0* 6.0*  ALBUMIN 2.6* 2.2* 2.6*    Recent Labs Lab 05/09/15 1900  LIPASE 18*   No results for input(s): AMMONIA in the last 168 hours. CBC:  Recent Labs Lab 05/09/15 1900 05/10/15 0602 05/11/15 0935  WBC 13.2* 12.0* 10.1  HGB 9.7* 9.2* 11.0*  HCT 29.7* 28.7* 34.4*  MCV 85.1 85.9 87.5  PLT 191 193 255    CBG:  Recent Labs Lab 05/11/15 0957 05/11/15 1159 05/11/15 1702 05/11/15 2100 05/12/15 0757  GLUCAP 59* 124* 99 116* 25*       Signed:  Casie Sturgeon S  [facility] 05/12/2015, 11:30 AM

## 2015-05-17 DIAGNOSIS — I129 Hypertensive chronic kidney disease with stage 1 through stage 4 chronic kidney disease, or unspecified chronic kidney disease: Secondary | ICD-10-CM | POA: Diagnosis not present

## 2015-05-17 DIAGNOSIS — M109 Gout, unspecified: Secondary | ICD-10-CM | POA: Diagnosis not present

## 2015-05-17 DIAGNOSIS — J45909 Unspecified asthma, uncomplicated: Secondary | ICD-10-CM | POA: Diagnosis not present

## 2015-05-17 DIAGNOSIS — N183 Chronic kidney disease, stage 3 (moderate): Secondary | ICD-10-CM | POA: Diagnosis not present

## 2015-05-17 DIAGNOSIS — J449 Chronic obstructive pulmonary disease, unspecified: Secondary | ICD-10-CM | POA: Diagnosis not present

## 2015-05-17 DIAGNOSIS — E1122 Type 2 diabetes mellitus with diabetic chronic kidney disease: Secondary | ICD-10-CM | POA: Diagnosis not present

## 2015-05-17 DIAGNOSIS — D649 Anemia, unspecified: Secondary | ICD-10-CM | POA: Diagnosis not present

## 2015-05-17 DIAGNOSIS — R2689 Other abnormalities of gait and mobility: Secondary | ICD-10-CM | POA: Diagnosis not present

## 2015-05-17 DIAGNOSIS — K219 Gastro-esophageal reflux disease without esophagitis: Secondary | ICD-10-CM | POA: Diagnosis not present

## 2015-05-17 DIAGNOSIS — M6281 Muscle weakness (generalized): Secondary | ICD-10-CM | POA: Diagnosis not present

## 2015-05-17 DIAGNOSIS — I509 Heart failure, unspecified: Secondary | ICD-10-CM | POA: Diagnosis not present

## 2015-05-17 DIAGNOSIS — K7030 Alcoholic cirrhosis of liver without ascites: Secondary | ICD-10-CM | POA: Diagnosis not present

## 2015-05-17 DIAGNOSIS — E1151 Type 2 diabetes mellitus with diabetic peripheral angiopathy without gangrene: Secondary | ICD-10-CM | POA: Diagnosis not present

## 2015-05-19 ENCOUNTER — Telehealth: Payer: Self-pay | Admitting: Cardiovascular Disease

## 2015-05-19 DIAGNOSIS — E1122 Type 2 diabetes mellitus with diabetic chronic kidney disease: Secondary | ICD-10-CM | POA: Diagnosis not present

## 2015-05-19 DIAGNOSIS — D649 Anemia, unspecified: Secondary | ICD-10-CM | POA: Diagnosis not present

## 2015-05-19 DIAGNOSIS — E1151 Type 2 diabetes mellitus with diabetic peripheral angiopathy without gangrene: Secondary | ICD-10-CM | POA: Diagnosis not present

## 2015-05-19 DIAGNOSIS — I509 Heart failure, unspecified: Secondary | ICD-10-CM | POA: Diagnosis not present

## 2015-05-19 DIAGNOSIS — K7030 Alcoholic cirrhosis of liver without ascites: Secondary | ICD-10-CM | POA: Diagnosis not present

## 2015-05-19 DIAGNOSIS — I129 Hypertensive chronic kidney disease with stage 1 through stage 4 chronic kidney disease, or unspecified chronic kidney disease: Secondary | ICD-10-CM | POA: Diagnosis not present

## 2015-05-19 DIAGNOSIS — R2689 Other abnormalities of gait and mobility: Secondary | ICD-10-CM | POA: Diagnosis not present

## 2015-05-19 DIAGNOSIS — M109 Gout, unspecified: Secondary | ICD-10-CM | POA: Diagnosis not present

## 2015-05-19 DIAGNOSIS — N183 Chronic kidney disease, stage 3 (moderate): Secondary | ICD-10-CM | POA: Diagnosis not present

## 2015-05-19 DIAGNOSIS — J449 Chronic obstructive pulmonary disease, unspecified: Secondary | ICD-10-CM | POA: Diagnosis not present

## 2015-05-19 DIAGNOSIS — K219 Gastro-esophageal reflux disease without esophagitis: Secondary | ICD-10-CM | POA: Diagnosis not present

## 2015-05-19 DIAGNOSIS — J45909 Unspecified asthma, uncomplicated: Secondary | ICD-10-CM | POA: Diagnosis not present

## 2015-05-19 DIAGNOSIS — M6281 Muscle weakness (generalized): Secondary | ICD-10-CM | POA: Diagnosis not present

## 2015-05-19 NOTE — Telephone Encounter (Signed)
Called patient to let her know that on discharge from the hospital she is to continue on her ASA 81 mg daily

## 2015-05-19 NOTE — Telephone Encounter (Signed)
[name] is calling because they took her off of aspirin '81mg'$  when she was in the hospital and she wants to know what she do now , should she restart or  should she just stay off of them .  Please call   Thanks

## 2015-05-20 DIAGNOSIS — M6281 Muscle weakness (generalized): Secondary | ICD-10-CM | POA: Diagnosis not present

## 2015-05-20 DIAGNOSIS — K7030 Alcoholic cirrhosis of liver without ascites: Secondary | ICD-10-CM | POA: Diagnosis not present

## 2015-05-20 DIAGNOSIS — I509 Heart failure, unspecified: Secondary | ICD-10-CM | POA: Diagnosis not present

## 2015-05-20 DIAGNOSIS — J45909 Unspecified asthma, uncomplicated: Secondary | ICD-10-CM | POA: Diagnosis not present

## 2015-05-20 DIAGNOSIS — M109 Gout, unspecified: Secondary | ICD-10-CM | POA: Diagnosis not present

## 2015-05-20 DIAGNOSIS — E1151 Type 2 diabetes mellitus with diabetic peripheral angiopathy without gangrene: Secondary | ICD-10-CM | POA: Diagnosis not present

## 2015-05-20 DIAGNOSIS — E1122 Type 2 diabetes mellitus with diabetic chronic kidney disease: Secondary | ICD-10-CM | POA: Diagnosis not present

## 2015-05-20 DIAGNOSIS — D649 Anemia, unspecified: Secondary | ICD-10-CM | POA: Diagnosis not present

## 2015-05-20 DIAGNOSIS — J449 Chronic obstructive pulmonary disease, unspecified: Secondary | ICD-10-CM | POA: Diagnosis not present

## 2015-05-20 DIAGNOSIS — N183 Chronic kidney disease, stage 3 (moderate): Secondary | ICD-10-CM | POA: Diagnosis not present

## 2015-05-20 DIAGNOSIS — I129 Hypertensive chronic kidney disease with stage 1 through stage 4 chronic kidney disease, or unspecified chronic kidney disease: Secondary | ICD-10-CM | POA: Diagnosis not present

## 2015-05-20 DIAGNOSIS — K219 Gastro-esophageal reflux disease without esophagitis: Secondary | ICD-10-CM | POA: Diagnosis not present

## 2015-05-20 DIAGNOSIS — R2689 Other abnormalities of gait and mobility: Secondary | ICD-10-CM | POA: Diagnosis not present

## 2015-05-21 DIAGNOSIS — R2689 Other abnormalities of gait and mobility: Secondary | ICD-10-CM | POA: Diagnosis not present

## 2015-05-21 DIAGNOSIS — J45909 Unspecified asthma, uncomplicated: Secondary | ICD-10-CM | POA: Diagnosis not present

## 2015-05-21 DIAGNOSIS — M109 Gout, unspecified: Secondary | ICD-10-CM | POA: Diagnosis not present

## 2015-05-21 DIAGNOSIS — I509 Heart failure, unspecified: Secondary | ICD-10-CM | POA: Diagnosis not present

## 2015-05-21 DIAGNOSIS — K219 Gastro-esophageal reflux disease without esophagitis: Secondary | ICD-10-CM | POA: Diagnosis not present

## 2015-05-21 DIAGNOSIS — K7030 Alcoholic cirrhosis of liver without ascites: Secondary | ICD-10-CM | POA: Diagnosis not present

## 2015-05-21 DIAGNOSIS — N183 Chronic kidney disease, stage 3 (moderate): Secondary | ICD-10-CM | POA: Diagnosis not present

## 2015-05-21 DIAGNOSIS — E1151 Type 2 diabetes mellitus with diabetic peripheral angiopathy without gangrene: Secondary | ICD-10-CM | POA: Diagnosis not present

## 2015-05-21 DIAGNOSIS — M6281 Muscle weakness (generalized): Secondary | ICD-10-CM | POA: Diagnosis not present

## 2015-05-21 DIAGNOSIS — D649 Anemia, unspecified: Secondary | ICD-10-CM | POA: Diagnosis not present

## 2015-05-21 DIAGNOSIS — J449 Chronic obstructive pulmonary disease, unspecified: Secondary | ICD-10-CM | POA: Diagnosis not present

## 2015-05-21 DIAGNOSIS — E1122 Type 2 diabetes mellitus with diabetic chronic kidney disease: Secondary | ICD-10-CM | POA: Diagnosis not present

## 2015-05-21 DIAGNOSIS — I129 Hypertensive chronic kidney disease with stage 1 through stage 4 chronic kidney disease, or unspecified chronic kidney disease: Secondary | ICD-10-CM | POA: Diagnosis not present

## 2015-05-22 DIAGNOSIS — J449 Chronic obstructive pulmonary disease, unspecified: Secondary | ICD-10-CM | POA: Diagnosis not present

## 2015-05-22 DIAGNOSIS — M109 Gout, unspecified: Secondary | ICD-10-CM | POA: Diagnosis not present

## 2015-05-22 DIAGNOSIS — E162 Hypoglycemia, unspecified: Secondary | ICD-10-CM | POA: Diagnosis not present

## 2015-05-22 DIAGNOSIS — E1151 Type 2 diabetes mellitus with diabetic peripheral angiopathy without gangrene: Secondary | ICD-10-CM | POA: Diagnosis not present

## 2015-05-22 DIAGNOSIS — E1122 Type 2 diabetes mellitus with diabetic chronic kidney disease: Secondary | ICD-10-CM | POA: Diagnosis not present

## 2015-05-22 DIAGNOSIS — J45909 Unspecified asthma, uncomplicated: Secondary | ICD-10-CM | POA: Diagnosis not present

## 2015-05-22 DIAGNOSIS — M6281 Muscle weakness (generalized): Secondary | ICD-10-CM | POA: Diagnosis not present

## 2015-05-22 DIAGNOSIS — R2689 Other abnormalities of gait and mobility: Secondary | ICD-10-CM | POA: Diagnosis not present

## 2015-05-22 DIAGNOSIS — N183 Chronic kidney disease, stage 3 (moderate): Secondary | ICD-10-CM | POA: Diagnosis not present

## 2015-05-22 DIAGNOSIS — K7030 Alcoholic cirrhosis of liver without ascites: Secondary | ICD-10-CM | POA: Diagnosis not present

## 2015-05-22 DIAGNOSIS — I509 Heart failure, unspecified: Secondary | ICD-10-CM | POA: Diagnosis not present

## 2015-05-22 DIAGNOSIS — D649 Anemia, unspecified: Secondary | ICD-10-CM | POA: Diagnosis not present

## 2015-05-22 DIAGNOSIS — E161 Other hypoglycemia: Secondary | ICD-10-CM | POA: Diagnosis not present

## 2015-05-22 DIAGNOSIS — I129 Hypertensive chronic kidney disease with stage 1 through stage 4 chronic kidney disease, or unspecified chronic kidney disease: Secondary | ICD-10-CM | POA: Diagnosis not present

## 2015-05-22 DIAGNOSIS — K219 Gastro-esophageal reflux disease without esophagitis: Secondary | ICD-10-CM | POA: Diagnosis not present

## 2015-05-25 DIAGNOSIS — J449 Chronic obstructive pulmonary disease, unspecified: Secondary | ICD-10-CM | POA: Diagnosis not present

## 2015-05-25 DIAGNOSIS — M109 Gout, unspecified: Secondary | ICD-10-CM | POA: Diagnosis not present

## 2015-05-25 DIAGNOSIS — D649 Anemia, unspecified: Secondary | ICD-10-CM | POA: Diagnosis not present

## 2015-05-25 DIAGNOSIS — K7030 Alcoholic cirrhosis of liver without ascites: Secondary | ICD-10-CM | POA: Diagnosis not present

## 2015-05-25 DIAGNOSIS — I509 Heart failure, unspecified: Secondary | ICD-10-CM | POA: Diagnosis not present

## 2015-05-25 DIAGNOSIS — E1122 Type 2 diabetes mellitus with diabetic chronic kidney disease: Secondary | ICD-10-CM | POA: Diagnosis not present

## 2015-05-25 DIAGNOSIS — E1151 Type 2 diabetes mellitus with diabetic peripheral angiopathy without gangrene: Secondary | ICD-10-CM | POA: Diagnosis not present

## 2015-05-25 DIAGNOSIS — M6281 Muscle weakness (generalized): Secondary | ICD-10-CM | POA: Diagnosis not present

## 2015-05-25 DIAGNOSIS — J45909 Unspecified asthma, uncomplicated: Secondary | ICD-10-CM | POA: Diagnosis not present

## 2015-05-25 DIAGNOSIS — I129 Hypertensive chronic kidney disease with stage 1 through stage 4 chronic kidney disease, or unspecified chronic kidney disease: Secondary | ICD-10-CM | POA: Diagnosis not present

## 2015-05-25 DIAGNOSIS — N183 Chronic kidney disease, stage 3 (moderate): Secondary | ICD-10-CM | POA: Diagnosis not present

## 2015-05-25 DIAGNOSIS — R2689 Other abnormalities of gait and mobility: Secondary | ICD-10-CM | POA: Diagnosis not present

## 2015-05-25 DIAGNOSIS — K219 Gastro-esophageal reflux disease without esophagitis: Secondary | ICD-10-CM | POA: Diagnosis not present

## 2015-05-26 DIAGNOSIS — N183 Chronic kidney disease, stage 3 (moderate): Secondary | ICD-10-CM | POA: Diagnosis not present

## 2015-05-26 DIAGNOSIS — J45909 Unspecified asthma, uncomplicated: Secondary | ICD-10-CM | POA: Diagnosis not present

## 2015-05-26 DIAGNOSIS — K219 Gastro-esophageal reflux disease without esophagitis: Secondary | ICD-10-CM | POA: Diagnosis not present

## 2015-05-26 DIAGNOSIS — J449 Chronic obstructive pulmonary disease, unspecified: Secondary | ICD-10-CM | POA: Diagnosis not present

## 2015-05-26 DIAGNOSIS — I509 Heart failure, unspecified: Secondary | ICD-10-CM | POA: Diagnosis not present

## 2015-05-26 DIAGNOSIS — E1151 Type 2 diabetes mellitus with diabetic peripheral angiopathy without gangrene: Secondary | ICD-10-CM | POA: Diagnosis not present

## 2015-05-26 DIAGNOSIS — E1122 Type 2 diabetes mellitus with diabetic chronic kidney disease: Secondary | ICD-10-CM | POA: Diagnosis not present

## 2015-05-26 DIAGNOSIS — R2689 Other abnormalities of gait and mobility: Secondary | ICD-10-CM | POA: Diagnosis not present

## 2015-05-26 DIAGNOSIS — M109 Gout, unspecified: Secondary | ICD-10-CM | POA: Diagnosis not present

## 2015-05-26 DIAGNOSIS — M6281 Muscle weakness (generalized): Secondary | ICD-10-CM | POA: Diagnosis not present

## 2015-05-26 DIAGNOSIS — I129 Hypertensive chronic kidney disease with stage 1 through stage 4 chronic kidney disease, or unspecified chronic kidney disease: Secondary | ICD-10-CM | POA: Diagnosis not present

## 2015-05-26 DIAGNOSIS — K7030 Alcoholic cirrhosis of liver without ascites: Secondary | ICD-10-CM | POA: Diagnosis not present

## 2015-05-26 DIAGNOSIS — D649 Anemia, unspecified: Secondary | ICD-10-CM | POA: Diagnosis not present

## 2015-05-27 DIAGNOSIS — D649 Anemia, unspecified: Secondary | ICD-10-CM | POA: Diagnosis not present

## 2015-05-27 DIAGNOSIS — K7030 Alcoholic cirrhosis of liver without ascites: Secondary | ICD-10-CM | POA: Diagnosis not present

## 2015-05-27 DIAGNOSIS — R2689 Other abnormalities of gait and mobility: Secondary | ICD-10-CM | POA: Diagnosis not present

## 2015-05-27 DIAGNOSIS — I129 Hypertensive chronic kidney disease with stage 1 through stage 4 chronic kidney disease, or unspecified chronic kidney disease: Secondary | ICD-10-CM | POA: Diagnosis not present

## 2015-05-27 DIAGNOSIS — J45909 Unspecified asthma, uncomplicated: Secondary | ICD-10-CM | POA: Diagnosis not present

## 2015-05-27 DIAGNOSIS — E1151 Type 2 diabetes mellitus with diabetic peripheral angiopathy without gangrene: Secondary | ICD-10-CM | POA: Diagnosis not present

## 2015-05-27 DIAGNOSIS — N183 Chronic kidney disease, stage 3 (moderate): Secondary | ICD-10-CM | POA: Diagnosis not present

## 2015-05-27 DIAGNOSIS — M109 Gout, unspecified: Secondary | ICD-10-CM | POA: Diagnosis not present

## 2015-05-27 DIAGNOSIS — I509 Heart failure, unspecified: Secondary | ICD-10-CM | POA: Diagnosis not present

## 2015-05-27 DIAGNOSIS — M6281 Muscle weakness (generalized): Secondary | ICD-10-CM | POA: Diagnosis not present

## 2015-05-27 DIAGNOSIS — E1122 Type 2 diabetes mellitus with diabetic chronic kidney disease: Secondary | ICD-10-CM | POA: Diagnosis not present

## 2015-05-27 DIAGNOSIS — K219 Gastro-esophageal reflux disease without esophagitis: Secondary | ICD-10-CM | POA: Diagnosis not present

## 2015-05-27 DIAGNOSIS — J449 Chronic obstructive pulmonary disease, unspecified: Secondary | ICD-10-CM | POA: Diagnosis not present

## 2015-05-28 DIAGNOSIS — E1122 Type 2 diabetes mellitus with diabetic chronic kidney disease: Secondary | ICD-10-CM | POA: Diagnosis not present

## 2015-05-28 DIAGNOSIS — D649 Anemia, unspecified: Secondary | ICD-10-CM | POA: Diagnosis not present

## 2015-05-28 DIAGNOSIS — N183 Chronic kidney disease, stage 3 (moderate): Secondary | ICD-10-CM | POA: Diagnosis not present

## 2015-05-28 DIAGNOSIS — I129 Hypertensive chronic kidney disease with stage 1 through stage 4 chronic kidney disease, or unspecified chronic kidney disease: Secondary | ICD-10-CM | POA: Diagnosis not present

## 2015-05-28 DIAGNOSIS — I509 Heart failure, unspecified: Secondary | ICD-10-CM | POA: Diagnosis not present

## 2015-05-28 DIAGNOSIS — J449 Chronic obstructive pulmonary disease, unspecified: Secondary | ICD-10-CM | POA: Diagnosis not present

## 2015-05-28 DIAGNOSIS — M6281 Muscle weakness (generalized): Secondary | ICD-10-CM | POA: Diagnosis not present

## 2015-05-28 DIAGNOSIS — J45909 Unspecified asthma, uncomplicated: Secondary | ICD-10-CM | POA: Diagnosis not present

## 2015-05-28 DIAGNOSIS — K219 Gastro-esophageal reflux disease without esophagitis: Secondary | ICD-10-CM | POA: Diagnosis not present

## 2015-05-28 DIAGNOSIS — E1151 Type 2 diabetes mellitus with diabetic peripheral angiopathy without gangrene: Secondary | ICD-10-CM | POA: Diagnosis not present

## 2015-05-28 DIAGNOSIS — R2689 Other abnormalities of gait and mobility: Secondary | ICD-10-CM | POA: Diagnosis not present

## 2015-05-28 DIAGNOSIS — M109 Gout, unspecified: Secondary | ICD-10-CM | POA: Diagnosis not present

## 2015-05-28 DIAGNOSIS — K7030 Alcoholic cirrhosis of liver without ascites: Secondary | ICD-10-CM | POA: Diagnosis not present

## 2015-05-29 DIAGNOSIS — I509 Heart failure, unspecified: Secondary | ICD-10-CM | POA: Diagnosis not present

## 2015-05-29 DIAGNOSIS — J449 Chronic obstructive pulmonary disease, unspecified: Secondary | ICD-10-CM | POA: Diagnosis not present

## 2015-05-29 DIAGNOSIS — K7030 Alcoholic cirrhosis of liver without ascites: Secondary | ICD-10-CM | POA: Diagnosis not present

## 2015-05-29 DIAGNOSIS — K219 Gastro-esophageal reflux disease without esophagitis: Secondary | ICD-10-CM | POA: Diagnosis not present

## 2015-05-29 DIAGNOSIS — R2689 Other abnormalities of gait and mobility: Secondary | ICD-10-CM | POA: Diagnosis not present

## 2015-05-29 DIAGNOSIS — M109 Gout, unspecified: Secondary | ICD-10-CM | POA: Diagnosis not present

## 2015-05-29 DIAGNOSIS — E1122 Type 2 diabetes mellitus with diabetic chronic kidney disease: Secondary | ICD-10-CM | POA: Diagnosis not present

## 2015-05-29 DIAGNOSIS — M6281 Muscle weakness (generalized): Secondary | ICD-10-CM | POA: Diagnosis not present

## 2015-05-29 DIAGNOSIS — J45909 Unspecified asthma, uncomplicated: Secondary | ICD-10-CM | POA: Diagnosis not present

## 2015-05-29 DIAGNOSIS — E1151 Type 2 diabetes mellitus with diabetic peripheral angiopathy without gangrene: Secondary | ICD-10-CM | POA: Diagnosis not present

## 2015-05-29 DIAGNOSIS — N183 Chronic kidney disease, stage 3 (moderate): Secondary | ICD-10-CM | POA: Diagnosis not present

## 2015-05-29 DIAGNOSIS — I129 Hypertensive chronic kidney disease with stage 1 through stage 4 chronic kidney disease, or unspecified chronic kidney disease: Secondary | ICD-10-CM | POA: Diagnosis not present

## 2015-05-29 DIAGNOSIS — D649 Anemia, unspecified: Secondary | ICD-10-CM | POA: Diagnosis not present

## 2015-06-01 DIAGNOSIS — K7460 Unspecified cirrhosis of liver: Secondary | ICD-10-CM | POA: Diagnosis not present

## 2015-06-01 DIAGNOSIS — E1121 Type 2 diabetes mellitus with diabetic nephropathy: Secondary | ICD-10-CM | POA: Diagnosis not present

## 2015-06-01 DIAGNOSIS — K219 Gastro-esophageal reflux disease without esophagitis: Secondary | ICD-10-CM | POA: Diagnosis not present

## 2015-06-01 DIAGNOSIS — R600 Localized edema: Secondary | ICD-10-CM | POA: Diagnosis not present

## 2015-06-01 DIAGNOSIS — F600 Paranoid personality disorder: Secondary | ICD-10-CM | POA: Diagnosis not present

## 2015-06-01 DIAGNOSIS — Z23 Encounter for immunization: Secondary | ICD-10-CM | POA: Diagnosis not present

## 2015-06-02 ENCOUNTER — Emergency Department (HOSPITAL_COMMUNITY)
Admission: EM | Admit: 2015-06-02 | Discharge: 2015-06-03 | Disposition: A | Discharge status: Skilled Nursing Facility | Payer: Medicare Other | Source: Home / Self Care | Attending: Emergency Medicine | Admitting: Emergency Medicine

## 2015-06-02 ENCOUNTER — Emergency Department (HOSPITAL_COMMUNITY): Payer: Medicare Other

## 2015-06-02 ENCOUNTER — Encounter (HOSPITAL_COMMUNITY): Payer: Self-pay | Admitting: Emergency Medicine

## 2015-06-02 ENCOUNTER — Emergency Department (HOSPITAL_COMMUNITY)
Admission: EM | Admit: 2015-06-02 | Discharge: 2015-06-02 | Disposition: A | Discharge status: Home / Self Care | Payer: Medicare Other | Attending: Emergency Medicine | Admitting: Emergency Medicine

## 2015-06-02 DIAGNOSIS — Z86018 Personal history of other benign neoplasm: Secondary | ICD-10-CM | POA: Diagnosis not present

## 2015-06-02 DIAGNOSIS — W1839XA Other fall on same level, initial encounter: Secondary | ICD-10-CM | POA: Diagnosis not present

## 2015-06-02 DIAGNOSIS — Z87891 Personal history of nicotine dependence: Secondary | ICD-10-CM | POA: Insufficient documentation

## 2015-06-02 DIAGNOSIS — R531 Weakness: Secondary | ICD-10-CM

## 2015-06-02 DIAGNOSIS — Z8701 Personal history of pneumonia (recurrent): Secondary | ICD-10-CM | POA: Diagnosis not present

## 2015-06-02 DIAGNOSIS — Z86010 Personal history of colonic polyps: Secondary | ICD-10-CM | POA: Insufficient documentation

## 2015-06-02 DIAGNOSIS — Z862 Personal history of diseases of the blood and blood-forming organs and certain disorders involving the immune mechanism: Secondary | ICD-10-CM | POA: Insufficient documentation

## 2015-06-02 DIAGNOSIS — Z86718 Personal history of other venous thrombosis and embolism: Secondary | ICD-10-CM | POA: Diagnosis not present

## 2015-06-02 DIAGNOSIS — M79601 Pain in right arm: Secondary | ICD-10-CM | POA: Diagnosis not present

## 2015-06-02 DIAGNOSIS — I509 Heart failure, unspecified: Secondary | ICD-10-CM | POA: Diagnosis not present

## 2015-06-02 DIAGNOSIS — Y998 Other external cause status: Secondary | ICD-10-CM | POA: Diagnosis not present

## 2015-06-02 DIAGNOSIS — K219 Gastro-esophageal reflux disease without esophagitis: Secondary | ICD-10-CM | POA: Diagnosis not present

## 2015-06-02 DIAGNOSIS — S42301A Unspecified fracture of shaft of humerus, right arm, initial encounter for closed fracture: Secondary | ICD-10-CM | POA: Diagnosis not present

## 2015-06-02 DIAGNOSIS — Z7982 Long term (current) use of aspirin: Secondary | ICD-10-CM | POA: Diagnosis not present

## 2015-06-02 DIAGNOSIS — Y9389 Activity, other specified: Secondary | ICD-10-CM | POA: Insufficient documentation

## 2015-06-02 DIAGNOSIS — R52 Pain, unspecified: Secondary | ICD-10-CM | POA: Diagnosis not present

## 2015-06-02 DIAGNOSIS — Y92129 Unspecified place in nursing home as the place of occurrence of the external cause: Secondary | ICD-10-CM | POA: Insufficient documentation

## 2015-06-02 DIAGNOSIS — Z794 Long term (current) use of insulin: Secondary | ICD-10-CM | POA: Insufficient documentation

## 2015-06-02 DIAGNOSIS — Z79899 Other long term (current) drug therapy: Secondary | ICD-10-CM | POA: Insufficient documentation

## 2015-06-02 DIAGNOSIS — J449 Chronic obstructive pulmonary disease, unspecified: Secondary | ICD-10-CM | POA: Diagnosis not present

## 2015-06-02 DIAGNOSIS — M25511 Pain in right shoulder: Secondary | ICD-10-CM | POA: Diagnosis not present

## 2015-06-02 DIAGNOSIS — F419 Anxiety disorder, unspecified: Secondary | ICD-10-CM | POA: Diagnosis not present

## 2015-06-02 DIAGNOSIS — Z880 Allergy status to penicillin: Secondary | ICD-10-CM | POA: Insufficient documentation

## 2015-06-02 DIAGNOSIS — F329 Major depressive disorder, single episode, unspecified: Secondary | ICD-10-CM | POA: Insufficient documentation

## 2015-06-02 DIAGNOSIS — E11649 Type 2 diabetes mellitus with hypoglycemia without coma: Secondary | ICD-10-CM | POA: Diagnosis not present

## 2015-06-02 DIAGNOSIS — S4991XA Unspecified injury of right shoulder and upper arm, initial encounter: Secondary | ICD-10-CM | POA: Diagnosis present

## 2015-06-02 DIAGNOSIS — E162 Hypoglycemia, unspecified: Secondary | ICD-10-CM

## 2015-06-02 DIAGNOSIS — Z7951 Long term (current) use of inhaled steroids: Secondary | ICD-10-CM | POA: Insufficient documentation

## 2015-06-02 DIAGNOSIS — S42291A Other displaced fracture of upper end of right humerus, initial encounter for closed fracture: Secondary | ICD-10-CM | POA: Diagnosis not present

## 2015-06-02 DIAGNOSIS — M1990 Unspecified osteoarthritis, unspecified site: Secondary | ICD-10-CM | POA: Diagnosis not present

## 2015-06-02 DIAGNOSIS — I5032 Chronic diastolic (congestive) heart failure: Secondary | ICD-10-CM | POA: Insufficient documentation

## 2015-06-02 DIAGNOSIS — Z872 Personal history of diseases of the skin and subcutaneous tissue: Secondary | ICD-10-CM | POA: Diagnosis not present

## 2015-06-02 DIAGNOSIS — R627 Adult failure to thrive: Secondary | ICD-10-CM | POA: Insufficient documentation

## 2015-06-02 LAB — CBC WITH DIFFERENTIAL/PLATELET
Basophils Absolute: 0 10*3/uL (ref 0.0–0.1)
Basophils Relative: 0 %
EOS ABS: 0 10*3/uL (ref 0.0–0.7)
Eosinophils Relative: 0 %
HEMATOCRIT: 25.8 % — AB (ref 36.0–46.0)
HEMOGLOBIN: 8.3 g/dL — AB (ref 12.0–15.0)
LYMPHS ABS: 1.6 10*3/uL (ref 0.7–4.0)
LYMPHS PCT: 16 %
MCH: 27.4 pg (ref 26.0–34.0)
MCHC: 32.2 g/dL (ref 30.0–36.0)
MCV: 85.1 fL (ref 78.0–100.0)
Monocytes Absolute: 0.7 10*3/uL (ref 0.1–1.0)
Monocytes Relative: 7 %
NEUTROS ABS: 8 10*3/uL — AB (ref 1.7–7.7)
NEUTROS PCT: 77 %
Platelets: 287 10*3/uL (ref 150–400)
RBC: 3.03 MIL/uL — AB (ref 3.87–5.11)
RDW: 15.6 % — ABNORMAL HIGH (ref 11.5–15.5)
WBC: 10.4 10*3/uL (ref 4.0–10.5)

## 2015-06-02 LAB — BLOOD GAS, ARTERIAL
Acid-base deficit: 6.5 mmol/L — ABNORMAL HIGH (ref 0.0–2.0)
BICARBONATE: 16.8 meq/L — AB (ref 20.0–24.0)
DRAWN BY: 295031
FIO2: 0.21
O2 Saturation: 99.3 %
PH ART: 7.417 (ref 7.350–7.450)
Patient temperature: 98.6
TCO2: 15.8 mmol/L (ref 0–100)
pCO2 arterial: 26.5 mmHg — ABNORMAL LOW (ref 35.0–45.0)
pO2, Arterial: 185 mmHg — ABNORMAL HIGH (ref 80.0–100.0)

## 2015-06-02 LAB — COMPREHENSIVE METABOLIC PANEL
ALT: 16 U/L (ref 14–54)
ANION GAP: 5 (ref 5–15)
AST: 28 U/L (ref 15–41)
Albumin: 2.1 g/dL — ABNORMAL LOW (ref 3.5–5.0)
Alkaline Phosphatase: 125 U/L (ref 38–126)
BUN: 20 mg/dL (ref 6–20)
CHLORIDE: 113 mmol/L — AB (ref 101–111)
CO2: 19 mmol/L — AB (ref 22–32)
CREATININE: 1.26 mg/dL — AB (ref 0.44–1.00)
Calcium: 7.8 mg/dL — ABNORMAL LOW (ref 8.9–10.3)
GFR, EST AFRICAN AMERICAN: 49 mL/min — AB (ref >60)
GFR, EST NON AFRICAN AMERICAN: 42 mL/min — AB (ref >60)
Glucose, Bld: 153 mg/dL — ABNORMAL HIGH (ref 65–99)
POTASSIUM: 3.6 mmol/L (ref 3.5–5.1)
SODIUM: 137 mmol/L (ref 135–145)
Total Bilirubin: 0.6 mg/dL (ref 0.3–1.2)
Total Protein: 5.1 g/dL — ABNORMAL LOW (ref 6.5–8.1)

## 2015-06-02 LAB — CBG MONITORING, ED
GLUCOSE-CAPILLARY: 129 mg/dL — AB (ref 65–99)
GLUCOSE-CAPILLARY: 118 mg/dL — AB (ref 65–99)

## 2015-06-02 LAB — I-STAT CG4 LACTIC ACID, ED: LACTIC ACID, VENOUS: 0.98 mmol/L (ref 0.5–2.0)

## 2015-06-02 MED ORDER — INSULIN GLARGINE 100 UNIT/ML ~~LOC~~ SOLN: 5.0000 [IU] | Freq: Every day | SUBCUTANEOUS | Status: DC

## 2015-06-02 MED ORDER — TRAMADOL HCL 50 MG PO TABS: 50.0000 mg | ORAL_TABLET | Freq: Four times a day (QID) | ORAL | Status: DC | PRN

## 2015-06-02 MED ORDER — MORPHINE SULFATE (PF) 4 MG/ML IV SOLN
4.0000 mg | Freq: Once | INTRAVENOUS | Status: AC
  Administered 2015-06-02: 4 mg via INTRAMUSCULAR
  Filled 2015-06-02: qty 1

## 2015-06-02 MED ORDER — SODIUM CHLORIDE 0.9 % IV BOLUS (SEPSIS): 500.0000 mL | Freq: Once | INTRAVENOUS | Status: DC

## 2015-06-02 NOTE — ED Notes (Signed)
Per EMS-Called to car by caregiver. Caregiver reports "failure to thrive". Pt was seen this am, for "fractured shoulder". Pt is alert, oriented and appropriate, Mobility with assist. Unable to use walker. Caregiver called EMS from car. Caregiver and patient are concerned the need about placement in assisted living. CBG 178

## 2015-06-02 NOTE — ED Notes (Signed)
Tried 2x after IV team left - everything blew instantly.  RN aware.

## 2015-06-02 NOTE — Discharge Instructions (Signed)
As discussed, there were 2 concerns identified during today's visit.  Of primary concern is your episode of low blood sugar or hypoglycemia.  Is important to take all medication as directed, monitor your blood sugar carefully, and discuss your insulin dosing with your primary care physician.  It is also important to follow-up with our orthopedist for further evaluation, management of your right shoulder injury.  Return here for concerning changes in your condition.

## 2015-06-02 NOTE — ED Notes (Signed)
Main lab at bedside 

## 2015-06-02 NOTE — ED Notes (Signed)
Bed: HN88 Expected date:  Expected time:  Means of arrival:  Comments: Ems-htn, hypoglycemia

## 2015-06-02 NOTE — ED Notes (Signed)
3 IV attempts by 2 RNs. Patient limited to one extremity at this time. IV consult placed.

## 2015-06-02 NOTE — ED Notes (Signed)
Respiratory at bedside.

## 2015-06-02 NOTE — ED Notes (Signed)
Pt from home. Home health aide checked on pt. Found pt sitting in doorway holding R arm. CBG was found to be 31, hx of DM. Pt ate half a PB sandwich, last CBG at 1008 was 42

## 2015-06-02 NOTE — ED Notes (Signed)
Main lab tried to get labs - was unsuccessful.

## 2015-06-02 NOTE — ED Provider Notes (Signed)
CSN: [id number]     Arrival date & time 06/02/15  1005 History   First MD Initiated Contact with Patient 06/02/15 1009     Chief Complaint  Patient presents with  . Hypoglycemia  . Arm Pain     (Consider location/radiation/quality/duration/timing/severity/associated sxs/prior Treatment) HPI  Patient presents from home with 2 primary complaints. Chief complaint #1, hyperglycemia. Today, patient was found by her home health aide to be hypoglycemic. Patient has been taking all medication as directed, seemingly, since recent admission to our affiliated hospital for hypoglycemia. After the patient was hypoglycemic this morning, she was provided food, repeat glucose 42. EMS reports the patient has been tolerating oral intake well. Chief complaint #2, arm pain. Home health aide also found the patient sitting in a doorway, holding her right arm, after presumed fall. Patient cannot describe complete circumstances of the fall, does complain of pain throughout the right arm, but no loss of sensation or strength in the hand itself. She denies other pain, including head pain, neck pain, chest pain, belly pain. The pain is sore, severe, worse with any motion.   Past Medical History  Diagnosis Date  . Alcoholic cirrhosis ([place])   . Reflux esophagitis   . Gastroesophageal reflux   . Colon polyps   . CHF (congestive heart failure) ([place]) 10/13    grade 1 diastolic dysfunction, Nl LVF  . Hypertension   . History of GI diverticular bleed   . Pneumonia     history of  . Asthma   . Anemia   . Blood transfusion few years ago  . Villous adenoma of colon 05/10/11  . PVD (peripheral vascular disease) ([place]) 2009    bilat iliac stenting  . Chronic obstructive pulmonary disease (COPD) ([place])   . Arthritis   . Glaucoma     left  . Gout   . Acute venous embolism and thrombosis of deep vessels of proximal lower extremity ([place])   . Back pain   . Leg swelling   . Diabetes mellitus     insulin dep    . Hepatitis     alcoholic hepatitis  . Depression   . Anxiety   . On home oxygen therapy oxygen 2 liter per minute prn per [place]  . Cellulitis     bilateral lower extremities  . Chronic diastolic congestive heart failure [facility])    Past Surgical History  Procedure Laterality Date  . Cataract extraction Right yrs ago  . Colonoscopy    . Laparoscopic assissted total colectomy w/ j-pouch  04/22/11  . Ventral hernia repair  06/13/2012    Procedure: LAPAROSCOPIC VENTRAL HERNIA;  Surgeon: Adin Hector, MD;  Location: [facility];  Service: General;  Laterality: N/A;  Laparoscopic Assisted Ventral Hernia with Mesh  . Abdominal hysterectomy  yrs ago  . Esophagogastroduodenoscopy (egd) with propofol N/A 08/06/2013    Procedure: ESOPHAGOGASTRODUODENOSCOPY (EGD) WITH PROPOFOL;  Surgeon: Lear Ng, MD;  Location: [facility];  Service: Endoscopy;  Laterality: N/A;  . Colonoscopy with propofol N/A 08/06/2013    Procedure: COLONOSCOPY WITH PROPOFOL;  Surgeon: Lear Ng, MD;  Location: [facility];  Service: Endoscopy;  Laterality: N/A;   Family History  Problem Relation Age of Onset  . Cancer Brother     heent ca  . Cancer Sister     liver ca   Social History  Substance Use Topics  . Smoking status: Former Smoker -- 0.50 packs/day for 30 years    Types: Cigarettes  Quit date: 11/06/2005  . Smokeless tobacco: Never Used  . Alcohol Use: No     Comment: stopped 2005   OB History    No data available     Review of Systems  Constitutional:       Per HPI, otherwise negative  HENT:       Per HPI, otherwise negative  Respiratory:       Per HPI, otherwise negative  Cardiovascular:       Per HPI, otherwise negative  Gastrointestinal: Negative for vomiting.  Endocrine:       Negative aside from HPI  Genitourinary:       Neg aside from HPI   Musculoskeletal:       Per HPI, otherwise negative  Skin: Negative.   Neurological: Positive for weakness. Negative for syncope.       Allergies  Penicillins and Lipitor  Home Medications   Prior to Admission medications   Medication Sig Start Date End Date Taking? Authorizing Provider  albuterol (PROVENTIL HFA;VENTOLIN HFA) 108 (90 BASE) MCG/ACT inhaler Inhale 2 puffs into the lungs every 6 (six) hours as needed. For shortness of breath   Yes Historical Provider, MD  albuterol (PROVENTIL) (2.5 MG/3ML) 0.083% nebulizer solution Take 2.5 mg by nebulization every 6 (six) hours as needed for wheezing or shortness of breath.   Yes Historical Provider, MD  allopurinol (ZYLOPRIM) 100 MG tablet Take 100 mg by mouth 2 (two) times daily.   Yes Historical Provider, MD  aspirin 81 MG EC tablet Take 81 mg by mouth daily.     Yes Historical Provider, MD  carvedilol (COREG) 6.25 MG tablet Take 1 tablet (6.25 mg total) by mouth 2 (two) times daily with a meal. 09/05/14  Yes [name], MD  cilostazol (PLETAL) 100 MG tablet Take 50 mg by mouth 2 (two) times daily before a meal.    Yes Historical Provider, MD  cloNIDine (CATAPRES) 0.1 MG tablet Take 1 tablet (0.1 mg total) by mouth 3 (three) times daily. 09/05/14  Yes [name], MD  ergocalciferol (VITAMIN D2) 50000 UNITS capsule Take 50,000 Units by mouth every Monday. On Monday   Yes Historical Provider, MD  fluticasone (FLONASE) 50 MCG/ACT nasal spray Place 2 sprays into the nose daily as needed for allergies.    Yes Historical Provider, MD  glucose monitoring kit (FREESTYLE) monitoring kit 1 each by Does not apply route 4 (four) times daily - after meals and at bedtime. 1 month Diabetic Testing Supplies for QAC-QHS accuchecks.Any brand OK 07/30/14  Yes Thurnell Lose, MD  Insulin Syringe-Needle U-100 25G X 1" 1 ML MISC Any brand, for 4 times a day insulin SQ, 1 month supply. 07/30/14  Yes Thurnell Lose, MD  metoprolol (LOPRESSOR) 50 MG tablet Take 25 mg by mouth 2 (two) times daily. 03/04/15  Yes Historical Provider, MD  montelukast (SINGULAIR) 10 MG tablet Take 10 mg by  mouth at bedtime.    Yes Historical Provider, MD  omeprazole (PRILOSEC) 40 MG capsule Take 40 mg by mouth daily.    Yes Historical Provider, MD  OXYGEN-HELIUM IN Inhale 2 L into the lungs at bedtime. 2 liters nightly   Yes Historical Provider, MD  Polyethylene Glycol 400 (BLINK TEARS OP) Place 1 drop into the right eye daily as needed (dryness).    Yes Historical Provider, MD  simvastatin (ZOCOR) 10 MG tablet Take 10 mg by mouth daily.  11/30/14  Yes Historical Provider, MD  insulin glargine (LANTUS) 100  UNIT/ML injection Inject 0.05 mLs (5 Units total) into the skin at bedtime. Pen if approved 06/02/15   Carmin Muskrat, MD  Magnesium Oxide 250 MG TABS Take 1 tablet (250 mg total) by mouth daily. 05/12/15   Oswald Hillock, MD  traMADol (ULTRAM) 50 MG tablet Take 1 tablet (50 mg total) by mouth every 6 (six) hours as needed. 06/02/15   Carmin Muskrat, MD   BP 172/67 mmHg  Pulse 61  Temp(Src) 97.5 F (36.4 C) (Oral)  Resp 16  SpO2 96% Physical Exam  Constitutional: She is oriented to person, place, and time. She appears well-developed and well-nourished. No distress.  HENT:  Head: Normocephalic and atraumatic.  Eyes: Conjunctivae and EOM are normal.  Neck: No spinous process tenderness and no muscular tenderness present. No erythema and normal range of motion present.  Cardiovascular: Normal rate and regular rhythm.   Pulmonary/Chest: Effort normal and breath sounds normal. No stridor. No respiratory distress.  Abdominal: She exhibits no distension.  Musculoskeletal: She exhibits no edema.       Right shoulder: She exhibits tenderness and bony tenderness.       Left shoulder: Normal.       Right elbow: Tenderness found. Radial head, medial epicondyle, lateral epicondyle and olecranon process tenderness noted.       Right wrist: She exhibits tenderness and bony tenderness.       Right hip: Normal.       Left hip: Normal.  Neurological: She is alert and oriented to person, place, and time. No  cranial nerve deficit.  Skin: Skin is warm and dry.  Psychiatric: She has a normal mood and affect.  Nursing note and vitals reviewed.  I discussed patient's case with EMS providers on arrival. ED Course  Procedures (including critical care time) Labs Review Labs Reviewed  COMPREHENSIVE METABOLIC PANEL - Abnormal; Notable for the following:    Chloride 113 (*)    CO2 19 (*)    Glucose, Bld 153 (*)    Creatinine, Ser 1.26 (*)    Calcium 7.8 (*)    Total Protein 5.1 (*)    Albumin 2.1 (*)    GFR calc non Af Amer 42 (*)    GFR calc Af Amer 49 (*)    All other components within normal limits  CBC WITH DIFFERENTIAL/PLATELET - Abnormal; Notable for the following:    RBC 3.03 (*)    Hemoglobin 8.3 (*)    HCT 25.8 (*)    RDW 15.6 (*)    Neutro Abs 8.0 (*)    All other components within normal limits  BLOOD GAS, ARTERIAL - Abnormal; Notable for the following:    pCO2 arterial 26.5 (*)    pO2, Arterial 185 (*)    Bicarbonate 16.8 (*)    Acid-base deficit 6.5 (*)    All other components within normal limits  CBG MONITORING, ED - Abnormal; Notable for the following:    Glucose-Capillary 129 (*)    All other components within normal limits  CBG MONITORING, ED - Abnormal; Notable for the following:    Glucose-Capillary 118 (*)    All other components within normal limits  I-STAT CG4 LACTIC ACID, ED    Imaging Review Dg Shoulder Right  06/02/2015   CLINICAL DATA:  Fall at home this morning. Severe right shoulder pain.  EXAM: RIGHT SHOULDER - 2+ VIEW  COMPARISON:  04/30/2011  FINDINGS: Impacted right humeral neck fracture is seen. No evidence of dislocation. Generalized osteopenia noted.  IMPRESSION: Impacted right knee humeral neck fracture.  Osteopenia.   Electronically Signed   By: Earle Gell M.D.   On: 06/02/2015 11:19   I have personally reviewed and evaluated these images and lab results as part of my medical decision-making. Her review demonstrates recent hospitalization for  hypoglycemia.  3:29 PM Patient in no distress, vital signs are stable. IV access has not been obtained.   3:29 PM Labs obtained with the assistance of our respiratory therapy colleagues. Labs are largely reassuring, and on repeat exam patient is in no distress. Blood sugar has remained normal for some time, hemodynamically patient is unremarkable, and patient will follow-up with primary care, orthopedics. Insulin dosing decreased pending primary care follow-up. MDM   Final diagnoses:  Hypoglycemia  Humerus fracture, right, closed, initial encounter   Elderly female presents after being found hypoglycemic of her possible fall. Here the patient is neurologically intact, hemodynamically stable, and labs are reassuring. No evidence for ongoing infection, and the patient has minimal complaint other than shoulder pain. Patient had appropriate immobilization with sling, which was well tolerated, was discharged in stable condition after changing her insulin dosing, providing analgesics, and appropriate follow-up resources.  Carmin Muskrat, MD 06/02/15 [phone]

## 2015-06-02 NOTE — ED Notes (Addendum)
Pt. Is going to receive an IV and nurse is going to attempt to draw labs.

## 2015-06-02 NOTE — ED Notes (Signed)
MD at bedside. 

## 2015-06-02 NOTE — ED Notes (Signed)
Resp did arterial stick - was successful at getting labs.

## 2015-06-02 NOTE — ED Notes (Signed)
Patient transported to X-ray 

## 2015-06-03 DIAGNOSIS — Z7982 Long term (current) use of aspirin: Secondary | ICD-10-CM | POA: Diagnosis not present

## 2015-06-03 DIAGNOSIS — Z86010 Personal history of colonic polyps: Secondary | ICD-10-CM | POA: Diagnosis not present

## 2015-06-03 DIAGNOSIS — I5032 Chronic diastolic (congestive) heart failure: Secondary | ICD-10-CM | POA: Diagnosis not present

## 2015-06-03 DIAGNOSIS — K219 Gastro-esophageal reflux disease without esophagitis: Secondary | ICD-10-CM | POA: Diagnosis not present

## 2015-06-03 DIAGNOSIS — Z7951 Long term (current) use of inhaled steroids: Secondary | ICD-10-CM | POA: Diagnosis not present

## 2015-06-03 DIAGNOSIS — K279 Peptic ulcer, site unspecified, unspecified as acute or chronic, without hemorrhage or perforation: Secondary | ICD-10-CM | POA: Diagnosis not present

## 2015-06-03 DIAGNOSIS — S42291A Other displaced fracture of upper end of right humerus, initial encounter for closed fracture: Secondary | ICD-10-CM | POA: Diagnosis not present

## 2015-06-03 DIAGNOSIS — K7030 Alcoholic cirrhosis of liver without ascites: Secondary | ICD-10-CM | POA: Diagnosis not present

## 2015-06-03 DIAGNOSIS — M1990 Unspecified osteoarthritis, unspecified site: Secondary | ICD-10-CM | POA: Diagnosis not present

## 2015-06-03 DIAGNOSIS — E118 Type 2 diabetes mellitus with unspecified complications: Secondary | ICD-10-CM | POA: Diagnosis not present

## 2015-06-03 DIAGNOSIS — F419 Anxiety disorder, unspecified: Secondary | ICD-10-CM | POA: Diagnosis not present

## 2015-06-03 DIAGNOSIS — S42211B Unspecified displaced fracture of surgical neck of right humerus, initial encounter for open fracture: Secondary | ICD-10-CM | POA: Diagnosis not present

## 2015-06-03 DIAGNOSIS — E1122 Type 2 diabetes mellitus with diabetic chronic kidney disease: Secondary | ICD-10-CM | POA: Diagnosis not present

## 2015-06-03 DIAGNOSIS — I739 Peripheral vascular disease, unspecified: Secondary | ICD-10-CM | POA: Diagnosis not present

## 2015-06-03 DIAGNOSIS — M79601 Pain in right arm: Secondary | ICD-10-CM | POA: Diagnosis not present

## 2015-06-03 DIAGNOSIS — E109 Type 1 diabetes mellitus without complications: Secondary | ICD-10-CM | POA: Diagnosis not present

## 2015-06-03 DIAGNOSIS — R2689 Other abnormalities of gait and mobility: Secondary | ICD-10-CM | POA: Diagnosis not present

## 2015-06-03 DIAGNOSIS — R627 Adult failure to thrive: Secondary | ICD-10-CM | POA: Diagnosis not present

## 2015-06-03 DIAGNOSIS — M25511 Pain in right shoulder: Secondary | ICD-10-CM | POA: Diagnosis not present

## 2015-06-03 DIAGNOSIS — N183 Chronic kidney disease, stage 3 (moderate): Secondary | ICD-10-CM | POA: Diagnosis not present

## 2015-06-03 DIAGNOSIS — E11649 Type 2 diabetes mellitus with hypoglycemia without coma: Secondary | ICD-10-CM | POA: Diagnosis not present

## 2015-06-03 DIAGNOSIS — Z86018 Personal history of other benign neoplasm: Secondary | ICD-10-CM | POA: Diagnosis not present

## 2015-06-03 DIAGNOSIS — Z794 Long term (current) use of insulin: Secondary | ICD-10-CM | POA: Diagnosis not present

## 2015-06-03 DIAGNOSIS — I509 Heart failure, unspecified: Secondary | ICD-10-CM | POA: Diagnosis not present

## 2015-06-03 DIAGNOSIS — J449 Chronic obstructive pulmonary disease, unspecified: Secondary | ICD-10-CM | POA: Diagnosis not present

## 2015-06-03 DIAGNOSIS — F329 Major depressive disorder, single episode, unspecified: Secondary | ICD-10-CM | POA: Diagnosis not present

## 2015-06-03 DIAGNOSIS — M6281 Muscle weakness (generalized): Secondary | ICD-10-CM | POA: Diagnosis not present

## 2015-06-03 DIAGNOSIS — I10 Essential (primary) hypertension: Secondary | ICD-10-CM | POA: Diagnosis not present

## 2015-06-03 DIAGNOSIS — Z87891 Personal history of nicotine dependence: Secondary | ICD-10-CM | POA: Diagnosis not present

## 2015-06-03 DIAGNOSIS — Z86718 Personal history of other venous thrombosis and embolism: Secondary | ICD-10-CM | POA: Diagnosis not present

## 2015-06-03 DIAGNOSIS — Z880 Allergy status to penicillin: Secondary | ICD-10-CM | POA: Diagnosis not present

## 2015-06-03 DIAGNOSIS — Z79899 Other long term (current) drug therapy: Secondary | ICD-10-CM | POA: Diagnosis not present

## 2015-06-03 DIAGNOSIS — I129 Hypertensive chronic kidney disease with stage 1 through stage 4 chronic kidney disease, or unspecified chronic kidney disease: Secondary | ICD-10-CM | POA: Diagnosis not present

## 2015-06-03 DIAGNOSIS — M25421 Effusion, right elbow: Secondary | ICD-10-CM | POA: Diagnosis not present

## 2015-06-03 DIAGNOSIS — Z8701 Personal history of pneumonia (recurrent): Secondary | ICD-10-CM | POA: Diagnosis not present

## 2015-06-03 DIAGNOSIS — C189 Malignant neoplasm of colon, unspecified: Secondary | ICD-10-CM | POA: Diagnosis not present

## 2015-06-03 DIAGNOSIS — S42291D Other displaced fracture of upper end of right humerus, subsequent encounter for fracture with routine healing: Secondary | ICD-10-CM | POA: Diagnosis not present

## 2015-06-03 DIAGNOSIS — Z872 Personal history of diseases of the skin and subcutaneous tissue: Secondary | ICD-10-CM | POA: Diagnosis not present

## 2015-06-03 DIAGNOSIS — S42301A Unspecified fracture of shaft of humerus, right arm, initial encounter for closed fracture: Secondary | ICD-10-CM | POA: Diagnosis not present

## 2015-06-03 DIAGNOSIS — S42294A Other nondisplaced fracture of upper end of right humerus, initial encounter for closed fracture: Secondary | ICD-10-CM | POA: Diagnosis not present

## 2015-06-03 DIAGNOSIS — Z862 Personal history of diseases of the blood and blood-forming organs and certain disorders involving the immune mechanism: Secondary | ICD-10-CM | POA: Diagnosis not present

## 2015-06-03 DIAGNOSIS — D649 Anemia, unspecified: Secondary | ICD-10-CM | POA: Diagnosis not present

## 2015-06-03 DIAGNOSIS — E785 Hyperlipidemia, unspecified: Secondary | ICD-10-CM | POA: Diagnosis not present

## 2015-06-03 LAB — CBC WITH DIFFERENTIAL/PLATELET
Basophils Absolute: 0 10*3/uL (ref 0.0–0.1)
Basophils Relative: 0 %
EOS ABS: 0 10*3/uL (ref 0.0–0.7)
EOS PCT: 0 %
HCT: 29.3 % — ABNORMAL LOW (ref 36.0–46.0)
Hemoglobin: 9.5 g/dL — ABNORMAL LOW (ref 12.0–15.0)
LYMPHS ABS: 1.2 10*3/uL (ref 0.7–4.0)
LYMPHS PCT: 7 %
MCH: 27.4 pg (ref 26.0–34.0)
MCHC: 32.4 g/dL (ref 30.0–36.0)
MCV: 84.4 fL (ref 78.0–100.0)
MONO ABS: 1.1 10*3/uL — AB (ref 0.1–1.0)
MONOS PCT: 6 %
Neutro Abs: 14.5 10*3/uL — ABNORMAL HIGH (ref 1.7–7.7)
Neutrophils Relative %: 87 %
PLATELETS: 365 10*3/uL (ref 150–400)
RBC: 3.47 MIL/uL — ABNORMAL LOW (ref 3.87–5.11)
RDW: 15.5 % (ref 11.5–15.5)
WBC: 16.8 10*3/uL — ABNORMAL HIGH (ref 4.0–10.5)

## 2015-06-03 LAB — COMPREHENSIVE METABOLIC PANEL
ALT: 15 U/L (ref 14–54)
ANION GAP: 4 — AB (ref 5–15)
AST: 29 U/L (ref 15–41)
Albumin: 2.4 g/dL — ABNORMAL LOW (ref 3.5–5.0)
Alkaline Phosphatase: 141 U/L — ABNORMAL HIGH (ref 38–126)
BUN: 20 mg/dL (ref 6–20)
CHLORIDE: 114 mmol/L — AB (ref 101–111)
CO2: 19 mmol/L — ABNORMAL LOW (ref 22–32)
CREATININE: 1.04 mg/dL — AB (ref 0.44–1.00)
Calcium: 8.3 mg/dL — ABNORMAL LOW (ref 8.9–10.3)
GFR, EST NON AFRICAN AMERICAN: 53 mL/min — AB (ref >60)
Glucose, Bld: 189 mg/dL — ABNORMAL HIGH (ref 65–99)
POTASSIUM: 3.6 mmol/L (ref 3.5–5.1)
Sodium: 137 mmol/L (ref 135–145)
Total Bilirubin: 0.8 mg/dL (ref 0.3–1.2)
Total Protein: 6.1 g/dL — ABNORMAL LOW (ref 6.5–8.1)

## 2015-06-03 LAB — CBG MONITORING, ED: GLUCOSE-CAPILLARY: 158 mg/dL — AB (ref 65–99)

## 2015-06-03 MED ORDER — TRAMADOL HCL 50 MG PO TABS
50.0000 mg | ORAL_TABLET | Freq: Four times a day (QID) | ORAL | Status: DC | PRN
  Administered 2015-06-03: 50 mg via ORAL
  Filled 2015-06-03: qty 1

## 2015-06-03 MED ORDER — FLUTICASONE PROPIONATE 50 MCG/ACT NA SUSP
2.0000 | Freq: Every day | NASAL | Status: DC
  Administered 2015-06-03: 2 via NASAL
  Filled 2015-06-03: qty 16

## 2015-06-03 MED ORDER — METOPROLOL TARTRATE 25 MG PO TABS
25.0000 mg | ORAL_TABLET | Freq: Two times a day (BID) | ORAL | Status: DC
  Administered 2015-06-03 (×2): 25 mg via ORAL
  Filled 2015-06-03 (×2): qty 1

## 2015-06-03 MED ORDER — INSULIN ASPART 100 UNIT/ML ~~LOC~~ SOLN: 0.0000 [IU] | SUBCUTANEOUS | Status: DC

## 2015-06-03 MED ORDER — CLONIDINE HCL 0.1 MG PO TABS
0.1000 mg | ORAL_TABLET | Freq: Three times a day (TID) | ORAL | Status: DC
  Administered 2015-06-03 (×3): 0.1 mg via ORAL
  Filled 2015-06-03 (×3): qty 1

## 2015-06-03 MED ORDER — ALBUTEROL SULFATE HFA 108 (90 BASE) MCG/ACT IN AERS: 2.0000 | INHALATION_SPRAY | Freq: Four times a day (QID) | RESPIRATORY_TRACT | Status: DC | PRN

## 2015-06-03 MED ORDER — CARVEDILOL 6.25 MG PO TABS
6.2500 mg | ORAL_TABLET | Freq: Two times a day (BID) | ORAL | Status: DC
  Administered 2015-06-03: 6.25 mg via ORAL
  Filled 2015-06-03 (×2): qty 1

## 2015-06-03 MED ORDER — ALBUTEROL SULFATE (2.5 MG/3ML) 0.083% IN NEBU: 2.5000 mg | INHALATION_SOLUTION | Freq: Four times a day (QID) | RESPIRATORY_TRACT | Status: DC | PRN

## 2015-06-03 MED ORDER — PANTOPRAZOLE SODIUM 40 MG PO TBEC
40.0000 mg | DELAYED_RELEASE_TABLET | Freq: Every day | ORAL | Status: DC
  Administered 2015-06-03: 40 mg via ORAL

## 2015-06-03 MED ORDER — CILOSTAZOL 50 MG PO TABS
50.0000 mg | ORAL_TABLET | Freq: Two times a day (BID) | ORAL | Status: DC
  Administered 2015-06-03: 50 mg via ORAL
  Filled 2015-06-03 (×2): qty 1

## 2015-06-03 MED ORDER — SIMVASTATIN 10 MG PO TABS
10.0000 mg | ORAL_TABLET | Freq: Every day | ORAL | Status: DC
  Administered 2015-06-03: 10 mg via ORAL
  Filled 2015-06-03: qty 1

## 2015-06-03 MED ORDER — ASPIRIN EC 81 MG PO TBEC
81.0000 mg | DELAYED_RELEASE_TABLET | Freq: Every day | ORAL | Status: DC
  Administered 2015-06-03: 81 mg via ORAL
  Filled 2015-06-03: qty 1

## 2015-06-03 MED ORDER — CARVEDILOL 6.25 MG PO TABS: 6.2500 mg | ORAL_TABLET | Freq: Two times a day (BID) | ORAL | Status: DC

## 2015-06-03 MED ORDER — ALLOPURINOL 100 MG PO TABS
100.0000 mg | ORAL_TABLET | Freq: Two times a day (BID) | ORAL | Status: DC
  Filled 2015-06-03: qty 1

## 2015-06-03 MED ORDER — TRAMADOL HCL 50 MG PO TABS
50.0000 mg | ORAL_TABLET | Freq: Once | ORAL | Status: AC
  Administered 2015-06-03: 50 mg via ORAL
  Filled 2015-06-03: qty 1

## 2015-06-03 MED ORDER — PANTOPRAZOLE SODIUM 40 MG PO TBEC
40.0000 mg | DELAYED_RELEASE_TABLET | Freq: Every day | ORAL | Status: DC
  Administered 2015-06-03: 40 mg via ORAL
  Filled 2015-06-03: qty 1

## 2015-06-03 MED ORDER — MONTELUKAST SODIUM 10 MG PO TABS
10.0000 mg | ORAL_TABLET | Freq: Every day | ORAL | Status: DC
  Filled 2015-06-03: qty 1

## 2015-06-03 MED ORDER — ERGOCALCIFEROL 1.25 MG (50000 UT) PO CAPS: 50000.0000 [IU] | ORAL_CAPSULE | ORAL | Status: DC

## 2015-06-03 MED ORDER — MAGNESIUM OXIDE 400 (241.3 MG) MG PO TABS
400.0000 mg | ORAL_TABLET | Freq: Every day | ORAL | Status: DC
  Administered 2015-06-03: 400 mg via ORAL
  Filled 2015-06-03 (×2): qty 1

## 2015-06-03 NOTE — ED Provider Notes (Signed)
Medical screening examination/treatment/procedure(s) were performed by non-physician practitioner and as supervising physician I was immediately available for consultation/collaboration.   EKG Interpretation None       Damariz Paganelli, MD 06/03/15 0202

## 2015-06-03 NOTE — ED Notes (Signed)
Pt unable to perform ADL's or care for herself at home due to shoulder injury; pt unable to ambulate bc she uses a walker; pt to have Social Work consult in the am for evaluation

## 2015-06-03 NOTE — ED Provider Notes (Signed)
0611 - Patient pending SW consult in AM. She is unable to perform her ADLs or care for herself at home secondary to humeral neck fracture. She has no one at home to help care for her. Will involve SW to attempt short term rehab placement. Patient signed out to oncoming midlevel provider.  Antonietta Breach, PA-C 06/03/15 [phone]  April Palumbo, MD 06/03/15 [phone]

## 2015-06-03 NOTE — Progress Notes (Signed)
CSW received consult for patient, has she is unable to ambulate with her walker due to shoulder injury. CSW spoke with charge nurse, who will get PT evaluation ordered.  [name], [place] Work  [employer] [phone]

## 2015-06-03 NOTE — Progress Notes (Addendum)
ED PA/NP spoke with CM about pt.  Inquired about home services for pt; CM went to speak with pt but she is asleep and not responding to her name being called; mouth partially open with rise and fall of chest (respirations) CM noted pt started with home services on 05/12/15 with [facility] per EPIC chart review.  CM obtained update from Cleora Fleet home care coordinator that pt was active with HHPT/OT since [facility] d/c 05/12/15 and [facility] staff feels pt needs placement CM spoke with ED SW and ED PA/NP about this

## 2015-06-03 NOTE — ED Notes (Signed)
Her home caregiver is here and wishes to speak with the social worker, whom we have paged to let her know this.  Pt. Remains in no distress.

## 2015-06-03 NOTE — ED Notes (Signed)
She requests something for her "indigestion" order rec'd. For p.p.i.--will give shortly.

## 2015-06-03 NOTE — Evaluation (Signed)
Physical Therapy Evaluation Patient Details Name: [name] MRN: [id number] DOB: 29-Jul-1946 Today's Date: 06/03/2015   History of Present Illness  69 yo female admitted with hypoglycemia, fall, acute R humeral fx. Hx of ETOH cirrhosis, CHF, HTN, arthritis, COPD  Clinical Impression  On eval, pt required Min assist for mobility-walked ~15 feet with 1 HHA. LOB x 1 requiring external assist to prevent fall. Pain rated 10/10 R UE with mobility. Recommend ST rehab at SNF.     Follow Up Recommendations SNF    Equipment Recommendations  None recommended by PT (pt has all DME)    Recommendations for Other Services OT consult     Precautions / Restrictions Precautions Precautions: Fall Required Braces or Orthoses: Sling (R UE) Restrictions Weight Bearing Restrictions: No      Mobility  Bed Mobility Overal bed mobility: Needs Assistance Bed Mobility: Supine to Sit;Sit to Supine     Supine to sit: Mod assist Sit to supine: Mod assist   General bed mobility comments: Assist for trunk and bil LEs. Increased time.   Transfers Overall transfer level: Needs assistance Equipment used: Rolling walker (2 wheeled);1 person hand held assist Transfers: Sit to/from Stand Sit to Stand: Min assist;From elevated surface         General transfer comment: Assist to rise, stabilize control descent.   Ambulation/Gait Ambulation/Gait assistance: Min assist Ambulation Distance (Feet): 15 Feet Assistive device: 1 person hand held assist Gait Pattern/deviations: Step-through pattern     General Gait Details: LOB x 1 posteriorly requiring external assist to prevent fall. Assist to stabilize.   Stairs            Wheelchair Mobility    Modified Rankin (Stroke Patients Only)       Balance           Standing balance support: During functional activity Standing balance-Leahy Scale: Fair                               Pertinent Vitals/Pain Pain Assessment:  0-10 Pain Location: 0 at rest; 10 with activity Pain Descriptors / Indicators: Aching;Sore;Sharp Pain Intervention(s): Limited activity within patient's tolerance;Repositioned    Home Living Family/patient expects to be discharged to:: Private residence Living Arrangements: Alone Available Help at Discharge: Personal care attendant (5 days a week; 1-2 hours) Type of Home: Apartment Home Access: Level entry     Home Layout: One level Home Equipment: Walker - 2 wheels;Cane - single point;Walker - 4 wheels;Bedside commode;Shower seat;Cane - quad      Prior Function Level of Independence: Needs assistance   Gait / Transfers Assistance Needed: using walker for ambulation  ADL's / Homemaking Assistance Needed: assist for bathing, dressing        Hand Dominance        Extremity/Trunk Assessment   Upper Extremity Assessment: RUE deficits/detail RUE Deficits / Details: in sling RUE: Unable to fully assess due to immobilization       Lower Extremity Assessment: Generalized weakness      Cervical / Trunk Assessment: Normal  Communication   Communication: No difficulties  Cognition Arousal/Alertness: Awake/alert Behavior During Therapy: WFL for tasks assessed/performed Overall Cognitive Status: Within Functional Limits for tasks assessed                      General Comments      Exercises        Assessment/Plan    PT  Assessment Patient needs continued PT services  PT Diagnosis Acute pain;Generalized weakness;Difficulty walking   PT Problem List Decreased strength;Decreased range of motion;Decreased activity tolerance;Decreased balance;Decreased mobility;Decreased knowledge of use of DME;Pain  PT Treatment Interventions DME instruction;Gait training;Functional mobility training;Therapeutic activities;Therapeutic exercise;Patient/family education;Balance training   PT Goals (Current goals can be found in the Care Plan section) Acute Rehab PT Goals Patient  Stated Goal: less pain PT Goal Formulation: With patient Time For Goal Achievement: 06/17/15 Potential to Achieve Goals: Good    Frequency Min 3X/week   Barriers to discharge        Co-evaluation               End of Session Equipment Utilized During Treatment: Gait belt Activity Tolerance: Patient limited by pain Patient left: in bed;with call bell/phone within reach      Functional Assessment Tool Used: clinical judgement Functional Limitation: Mobility: Walking and moving around Mobility: Walking and Moving Around Current Status (X8329): At least 20 percent but less than 40 percent impaired, limited or restricted Mobility: Walking and Moving Around Goal Status [phone]): At least 1 percent but less than 20 percent impaired, limited or restricted    Time: 1126-1145 PT Time Calculation (min) (ACUTE ONLY): 19 min   Charges:   PT Evaluation $Initial PT Evaluation Tier I: 1 Procedure     PT G Codes:   PT G-Codes **NOT FOR INPATIENT CLASS** Functional Assessment Tool Used: clinical judgement Functional Limitation: Mobility: Walking and moving around Mobility: Walking and Moving Around Current Status (M6004): At least 20 percent but less than 40 percent impaired, limited or restricted Mobility: Walking and Moving Around Goal Status [phone]): At least 1 percent but less than 20 percent impaired, limited or restricted    Weston Anna, MPT Pager: [phone]

## 2015-06-03 NOTE — ED Provider Notes (Signed)
Patient with shoulder fracture.  Unable to ambulate with walker at home. [place] is a [place] unable to provide full scope of  Help that patient currently needs. Awaiting care management for placement.   3:33 PM BP 190/93 mmHg  Pulse 92  Temp(Src) 97.6 F (36.4 C) (Oral)  Resp 16  SpO2 98% Home meds reordered. Currently awaiting placement.  Sign out givent to [place].  Margarita Mail, PA-C 06/03/15 1533  April Palumbo, MD 06/05/15 2325

## 2015-06-03 NOTE — Progress Notes (Signed)
Pt accepted to [facility] for skilled nursing for short term rehab. Pt caregiver/poa informed as well at Bedside. Pt to be transported by ptar. RN can call report to [phone]. CSW reinforced pt diet needs due to diverticulosis which admission stated can be accommodated.   [name], [place] Work  [employer] [phone]

## 2015-06-03 NOTE — ED Notes (Signed)
I have just been informed by our social worker that P.T. Recommends SNF placement.  Pt. Remains comfortable in her bed.  We have ordered lunch and will get it to her asap.

## 2015-06-03 NOTE — Progress Notes (Signed)
CSW met with pt at bedside to complete psychosocial assessment. Patient shared that she lives at home alone and has two nurses that come to assist her during the week but are not there for more than a couple of hours. Pt home health services is through [place]. Per RN cm home health as recommended patient to receive skilled nursing care at this time too due to shoulder injury. Pt states she is interested in snf placement in [place]. CSW completed fl2, and will initiate snf placement.   [name], [place] Work  [employer] [phone]

## 2015-06-03 NOTE — ED Provider Notes (Signed)
CSN: [id number]     Arrival date & time 06/02/15  1833 History   First MD Initiated Contact with Patient 06/02/15 2337     Chief Complaint  Patient presents with  . Shoulder Pain  . Failure To Thrive    called by caregiver     (Consider location/radiation/quality/duration/timing/severity/associated sxs/prior Treatment) Patient is a 69 y.o. female presenting with shoulder pain. The history is provided by the patient.  Shoulder Pain Location:  Shoulder Time since incident:  1 day Injury: yes   Mechanism of injury: fall   Fall:    Fall occurred:  Walking and standing Shoulder location:  R shoulder Pain details:    Quality:  Aching   Radiates to:  Does not radiate   Severity:  Severe   Onset quality:  Sudden   Timing:  Constant Chronicity:  New Dislocation: no   Relieved by:  Nothing Worsened by:  Movement Ineffective treatments:  None tried Associated symptoms: no back pain     Past Medical History  Diagnosis Date  . Alcoholic cirrhosis ([place])   . Reflux esophagitis   . Gastroesophageal reflux   . Colon polyps   . CHF (congestive heart failure) ([place]) 10/13    grade 1 diastolic dysfunction, Nl LVF  . Hypertension   . History of GI diverticular bleed   . Pneumonia     history of  . Asthma   . Anemia   . Blood transfusion few years ago  . Villous adenoma of colon 05/10/11  . PVD (peripheral vascular disease) ([place]) 2009    bilat iliac stenting  . Chronic obstructive pulmonary disease (COPD) ([place])   . Arthritis   . Glaucoma     left  . Gout   . Acute venous embolism and thrombosis of deep vessels of proximal lower extremity ([place])   . Back pain   . Leg swelling   . Diabetes mellitus     insulin dep   . Hepatitis     alcoholic hepatitis  . Depression   . Anxiety   . On home oxygen therapy oxygen 2 liter per minute prn per [place]  . Cellulitis     bilateral lower extremities  . Chronic diastolic congestive heart failure [facility])    Past Surgical History  Procedure  Laterality Date  . Cataract extraction Right yrs ago  . Colonoscopy    . Laparoscopic assissted total colectomy w/ j-pouch  04/22/11  . Ventral hernia repair  06/13/2012    Procedure: LAPAROSCOPIC VENTRAL HERNIA;  Surgeon: Adin Hector, MD;  Location: [facility];  Service: General;  Laterality: N/A;  Laparoscopic Assisted Ventral Hernia with Mesh  . Abdominal hysterectomy  yrs ago  . Esophagogastroduodenoscopy (egd) with propofol N/A 08/06/2013    Procedure: ESOPHAGOGASTRODUODENOSCOPY (EGD) WITH PROPOFOL;  Surgeon: Lear Ng, MD;  Location: [facility];  Service: Endoscopy;  Laterality: N/A;  . Colonoscopy with propofol N/A 08/06/2013    Procedure: COLONOSCOPY WITH PROPOFOL;  Surgeon: Lear Ng, MD;  Location: [facility];  Service: Endoscopy;  Laterality: N/A;   Family History  Problem Relation Age of Onset  . Cancer Brother     heent ca  . Cancer Sister     liver ca   Social History  Substance Use Topics  . Smoking status: Former Smoker -- 0.50 packs/day for 30 years    Types: Cigarettes    Quit date: 11/06/2005  . Smokeless tobacco: Never Used  . Alcohol Use: No     Comment:  stopped 2005   OB History    No data available     Review of Systems  Musculoskeletal: Negative for back pain.      Allergies  Penicillins and Lipitor  Home Medications   Prior to Admission medications   Medication Sig Start Date End Date Taking? Authorizing Provider  albuterol (PROVENTIL HFA;VENTOLIN HFA) 108 (90 BASE) MCG/ACT inhaler Inhale 2 puffs into the lungs every 6 (six) hours as needed. For shortness of breath    Historical Provider, MD  albuterol (PROVENTIL) (2.5 MG/3ML) 0.083% nebulizer solution Take 2.5 mg by nebulization every 6 (six) hours as needed for wheezing or shortness of breath.    Historical Provider, MD  allopurinol (ZYLOPRIM) 100 MG tablet Take 100 mg by mouth 2 (two) times daily.    Historical Provider, MD  aspirin 81 MG EC tablet Take 81 mg by mouth  daily.      Historical Provider, MD  carvedilol (COREG) 6.25 MG tablet Take 1 tablet (6.25 mg total) by mouth 2 (two) times daily with a meal. 09/05/14   [name], MD  cilostazol (PLETAL) 100 MG tablet Take 50 mg by mouth 2 (two) times daily before a meal.     Historical Provider, MD  cloNIDine (CATAPRES) 0.1 MG tablet Take 1 tablet (0.1 mg total) by mouth 3 (three) times daily. 09/05/14   [name], MD  ergocalciferol (VITAMIN D2) 50000 UNITS capsule Take 50,000 Units by mouth every Monday. On Monday    Historical Provider, MD  fluticasone (FLONASE) 50 MCG/ACT nasal spray Place 2 sprays into the nose daily as needed for allergies.     Historical Provider, MD  glucose monitoring kit (FREESTYLE) monitoring kit 1 each by Does not apply route 4 (four) times daily - after meals and at bedtime. 1 month Diabetic Testing Supplies for QAC-QHS accuchecks.Any brand OK 07/30/14   Thurnell Lose, MD  insulin glargine (LANTUS) 100 UNIT/ML injection Inject 0.05 mLs (5 Units total) into the skin at bedtime. Pen if approved 06/02/15   Carmin Muskrat, MD  Insulin Syringe-Needle U-100 25G X 1" 1 ML MISC Any brand, for 4 times a day insulin SQ, 1 month supply. 07/30/14   Thurnell Lose, MD  Magnesium Oxide 250 MG TABS Take 1 tablet (250 mg total) by mouth daily. 05/12/15   Oswald Hillock, MD  metoprolol (LOPRESSOR) 50 MG tablet Take 25 mg by mouth 2 (two) times daily. 03/04/15   Historical Provider, MD  montelukast (SINGULAIR) 10 MG tablet Take 10 mg by mouth at bedtime.     Historical Provider, MD  omeprazole (PRILOSEC) 40 MG capsule Take 40 mg by mouth daily.     Historical Provider, MD  OXYGEN-HELIUM IN Inhale 2 L into the lungs at bedtime. 2 liters nightly    Historical Provider, MD  Polyethylene Glycol 400 (BLINK TEARS OP) Place 1 drop into the right eye daily as needed (dryness).     Historical Provider, MD  simvastatin (ZOCOR) 10 MG tablet Take 10 mg by mouth daily.  11/30/14   Historical Provider, MD   traMADol (ULTRAM) 50 MG tablet Take 1 tablet (50 mg total) by mouth every 6 (six) hours as needed. 06/02/15   Carmin Muskrat, MD   BP 202/120 mmHg  Pulse 90  Temp(Src) 98 F (36.7 C) (Oral)  Resp 16  SpO2 97% Physical Exam  ED Course  Procedures (including critical care time) Labs Review Labs Reviewed  CBC WITH DIFFERENTIAL/PLATELET  COMPREHENSIVE METABOLIC PANEL  Imaging Review Dg Shoulder Right  06/02/2015   CLINICAL DATA:  Fall at home this morning. Severe right shoulder pain.  EXAM: RIGHT SHOULDER - 2+ VIEW  COMPARISON:  04/30/2011  FINDINGS: Impacted right humeral neck fracture is seen. No evidence of dislocation. Generalized osteopenia noted.  IMPRESSION: Impacted right knee humeral neck fracture.  Osteopenia.   Electronically Signed   By: Earle Gell M.D.   On: 06/02/2015 11:19   I have personally reviewed and evaluated these images and lab results as part of my medical decision-making.   EKG Interpretation None      MDM Pt has not taken her blood pressure medications. Pt reports she got home and could not tolerate pain and felt weak like she was going to pass out again.  Pt feels like her sugar is dropping again. Glucose is 158.  Labs pending.     Final diagnoses:  None    Pt's care turned over to [facility] Fall labs pending.     Fransico Meadow, PA-C 06/03/15 0109  [name], MD 06/18/15 [phone]

## 2015-06-03 NOTE — ED Notes (Signed)
Pt placed on bedpan with minimal discomfort, stool and urine. New brief placed on pt

## 2015-06-03 NOTE — ED Notes (Signed)
She is resting quietly and is breathing normally.

## 2015-06-03 NOTE — ED Provider Notes (Signed)
[name] is a 69 y.o. female who presents to the ED with shoulder fracture. Patient is currently awaiting placement at a skilled nursing facility. Currently resting comfortably in the ED and in no apparent distress. Patient transferred to living facility. Filed Vitals:   06/03/15 1543  BP: 169/97  Pulse: 74  Temp: 97.8 F (36.6 C)  Resp: [address], PA-C 06/04/15 0049  [name], MD 06/04/15 [phone]

## 2015-06-05 ENCOUNTER — Encounter: Payer: Self-pay | Admitting: Adult Health

## 2015-06-05 ENCOUNTER — Non-Acute Institutional Stay (SKILLED_NURSING_FACILITY): Payer: Medicare Other | Admitting: Adult Health

## 2015-06-05 DIAGNOSIS — S42293A Other displaced fracture of upper end of unspecified humerus, initial encounter for closed fracture: Secondary | ICD-10-CM | POA: Insufficient documentation

## 2015-06-05 DIAGNOSIS — I739 Peripheral vascular disease, unspecified: Secondary | ICD-10-CM

## 2015-06-05 DIAGNOSIS — E785 Hyperlipidemia, unspecified: Secondary | ICD-10-CM

## 2015-06-05 DIAGNOSIS — K7030 Alcoholic cirrhosis of liver without ascites: Secondary | ICD-10-CM

## 2015-06-05 DIAGNOSIS — N183 Chronic kidney disease, stage 3 unspecified: Secondary | ICD-10-CM | POA: Insufficient documentation

## 2015-06-05 DIAGNOSIS — M1A09X0 Idiopathic chronic gout, multiple sites, without tophus (tophi): Secondary | ICD-10-CM | POA: Diagnosis not present

## 2015-06-05 DIAGNOSIS — I10 Essential (primary) hypertension: Secondary | ICD-10-CM

## 2015-06-05 DIAGNOSIS — S42291A Other displaced fracture of upper end of right humerus, initial encounter for closed fracture: Secondary | ICD-10-CM

## 2015-06-05 DIAGNOSIS — J449 Chronic obstructive pulmonary disease, unspecified: Secondary | ICD-10-CM | POA: Diagnosis not present

## 2015-06-05 DIAGNOSIS — I5032 Chronic diastolic (congestive) heart failure: Secondary | ICD-10-CM

## 2015-06-05 DIAGNOSIS — K279 Peptic ulcer, site unspecified, unspecified as acute or chronic, without hemorrhage or perforation: Secondary | ICD-10-CM | POA: Diagnosis not present

## 2015-06-05 NOTE — Progress Notes (Signed)
Patient ID: [name], female   DOB: 10-22-1945, 69 y.o.   MRN: [id number]   Facility: [facility]      Allergies  Allergen Reactions  . Penicillins Hives    Has patient had a PCN reaction causing immediate rash, facial/tongue/throat swelling, SOB or lightheadedness with hypotension: No Has patient had a PCN reaction causing severe rash involving mucus membranes or skin necrosis: No Has patient had a PCN reaction that required hospitalization No Has patient had a PCN reaction occurring within the last 10 years: No If all of the above answers are "NO", then may proceed with Cephalosporin use.   . Lipitor [Atorvastatin] Swelling    Chief Complaint  Patient presents with  . Hospitalization Follow-up    HPI:  She was seen in the ED for right humeral head fracture. She was awaiting placement in skilled nursing prior to her fall. Per the medical record she was using a walker. She is unable to fully participate in the hpi or ros. There are no nursing concerns at this time. More than likely this does represent a long term placement for her.    Past Medical History  Diagnosis Date  . Alcoholic cirrhosis ([place])   . Reflux esophagitis   . Gastroesophageal reflux   . Colon polyps   . CHF (congestive heart failure) ([place]) 10/13    grade 1 diastolic dysfunction, Nl LVF  . Hypertension   . History of GI diverticular bleed   . Pneumonia     history of  . Asthma   . Anemia   . Blood transfusion few years ago  . Villous adenoma of colon 05/10/11  . PVD (peripheral vascular disease) ([place]) 2009    bilat iliac stenting  . Chronic obstructive pulmonary disease (COPD) ([place])   . Arthritis   . Glaucoma     left  . Gout   . Acute venous embolism and thrombosis of deep vessels of proximal lower extremity ([place])   . Back pain   . Leg swelling   . Diabetes mellitus     insulin dep   . Hepatitis     alcoholic hepatitis  . Depression   . Anxiety   . On home oxygen therapy  oxygen 2 liter per minute prn per [place]  . Cellulitis     bilateral lower extremities  . Chronic diastolic congestive heart failure [facility])     Past Surgical History  Procedure Laterality Date  . Cataract extraction Right yrs ago  . Colonoscopy    . Laparoscopic assissted total colectomy w/ j-pouch  04/22/11  . Ventral hernia repair  06/13/2012    Procedure: LAPAROSCOPIC VENTRAL HERNIA;  Surgeon: Adin Hector, MD;  Location: [place];  Service: General;  Laterality: N/A;  Laparoscopic Assisted Ventral Hernia with Mesh  . Abdominal hysterectomy  yrs ago  . Esophagogastroduodenoscopy (egd) with propofol N/A 08/06/2013    Procedure: ESOPHAGOGASTRODUODENOSCOPY (EGD) WITH PROPOFOL;  Surgeon: Lear Ng, MD;  Location: [facility];  Service: Endoscopy;  Laterality: N/A;  . Colonoscopy with propofol N/A 08/06/2013    Procedure: COLONOSCOPY WITH PROPOFOL;  Surgeon: Lear Ng, MD;  Location: [facility];  Service: Endoscopy;  Laterality: N/A;    VITAL SIGNS BP 168/54 mmHg  Pulse 72  Ht 5' (1.524 m)  Wt 124 lb (56.246 kg)  BMI 24.22 kg/m2  Patient's Medications  New Prescriptions   No medications on file  Previous Medications   ALBUTEROL (PROVENTIL HFA;VENTOLIN HFA) 108 (90 BASE) MCG/ACT  INHALER    Inhale 2 puffs into the lungs every 6 (six) hours as needed. For shortness of breath   ALBUTEROL (PROVENTIL) (2.5 MG/3ML) 0.083% NEBULIZER SOLUTION    Take 2.5 mg by nebulization every 6 (six) hours as needed for wheezing or shortness of breath.   ALLOPURINOL (ZYLOPRIM) 100 MG TABLET    Take 100 mg by mouth 2 (two) times daily.   ASPIRIN 81 MG EC TABLET    Take 81 mg by mouth daily.     CARVEDILOL (COREG) 6.25 MG TABLET    Take 1 tablet (6.25 mg total) by mouth 2 (two) times daily with a meal.   CILOSTAZOL (PLETAL) 100 MG TABLET    Take 50 mg by mouth 2 (two) times daily before a meal.    CLONIDINE (CATAPRES) 0.1 MG TABLET    Take 1 tablet (0.1 mg total) by mouth 3 (three) times  daily.   ERGOCALCIFEROL (VITAMIN D2) 50000 UNITS CAPSULE    Take 50,000 Units by mouth every Monday. On Monday   FLUTICASONE (FLONASE) 50 MCG/ACT NASAL SPRAY    Place 2 sprays into the nose daily as needed for allergies.    GLUCOSE MONITORING KIT (FREESTYLE) MONITORING KIT    1 each by Does not apply route 4 (four) times daily - after meals and at bedtime. 1 month Diabetic Testing Supplies for QAC-QHS accuchecks.Any brand OK   MAGNESIUM OXIDE 250 MG TABS    Take 1 tablet (250 mg total) by mouth daily.   METOPROLOL (LOPRESSOR) 50 MG TABLET    Take 25 mg by mouth 2 (two) times daily.   MONTELUKAST (SINGULAIR) 10 MG TABLET    Take 10 mg by mouth at bedtime.    OMEPRAZOLE (PRILOSEC) 40 MG CAPSULE    Take 40 mg by mouth daily.    OXYGEN-HELIUM IN    Inhale 2 L into the lungs at bedtime. 2 liters nightly   SIMVASTATIN (ZOCOR) 10 MG TABLET    Take 10 mg by mouth daily.    TRAMADOL (ULTRAM) 50 MG TABLET    Take 1 tablet (50 mg total) by mouth every 6 (six) hours as needed.  Modified Medications   No medications on file  Discontinued Medications   No medications on file     SIGNIFICANT DIAGNOSTIC EXAMS  02/2013: ABI: 0.68 and 0.69   05-09-15: ct of abdomen and pelvis: Calcifications in the pancreas consistent with chronic pancreatitis. Prominence the gastric wall may be due to thickening or normal under distention. Gastritis is not excluded. No evidence of bowel obstruction. Diverticulosis of sigmoid colon without evidence of diverticulitis. Inferior ventral abdominal wall hernia containing fat.  06-02-15: right shoulder x-ray: Impacted right  humeral neck fracture.  Osteopenia.   LABS REVIEWED:   05-10-15: hgb a1c 7.2 06-02-15; wbc 10.4; hgb 8.3; hct 25.8; mcv 85.1; plt 287; glucose 153; bun 20; creat 1.26; k+ 3.6; na++137; liver normal albumin 2.1  06-03-15: wbc 16.8; hgb 9.5; hct 29.3; mcv 84.4; plt 365; glucose 198; bun 20; creat 1.04; k+ 3.6; na++137; liver normal albumin 2.4      Review  of Systems  Unable to perform ROS: Dementia      Physical Exam  Constitutional: No distress.  Frail   Eyes: Conjunctivae are normal.  Neck: Neck supple. No JVD present. No thyromegaly present.  Cardiovascular: Normal rate and regular rhythm.   Bilateral lower extremities discolored due to pvd Pedal pulses very faint   Respiratory: Effort normal and breath sounds normal. No  respiratory distress. She has no wheezes.  GI: Soft. Bowel sounds are normal. She exhibits no distension. There is no tenderness.  Musculoskeletal: She exhibits no edema.  Right arm in sling Able to move all other extremities   Lymphadenopathy:    She has no cervical adenopathy.  Neurological: She is alert.  Skin: Skin is warm and dry. She is not diaphoretic.  Psychiatric: She has a normal mood and affect.       ASSESSMENT/ PLAN:  1. Right humeral head fracture: will continue therapy as directed and will follow up with orthopedics as indicated.  Will conitnue ultram 50 mg every 6 hours as needed and will monitor her status.   2. COPD: is presently stable at this time. She is 02 dependent; will continue singulair 10 mg nightly; will continue albuterol inhaler or neb treatment every 6 hours as needed and flonase daily as needed for allergic rhinitis  3. Gout: no recent flares present will continue allopurinol 100 mg twice daily   4. Dyslipidemia: will continue zocor 10 mg daily   5. PVD: will continue pletal 100 mg twice daily asa 81 mg daily   6. Hypertension: will  clonidine 0.1 mg three times daily asa 81 mg daily will stop the lopressor as she is on coreg and will increase to 12.5 mg twice daily will have nursing check blood pressure twice daily with pulse and will monitor   7. Chronic diastolic heart failure: will continue coreg at 12.5 mg twice daily is presently not on diuretic will continue asa 81 mg daily and will monitor weight 3 times weekly    8. PUD status post GI bleed (2011): will continue  prilosec 40 mg daily   9. Vit d deficiency: will continue vit d 50,000 units weekly  10. Cirrhosis of liver: her liver function is stable at this time will monitor   11. Chronic kidney disease stage III: renal function is stable with bun/creat 20/1.04   Will repeat cbc and cmp    Time spent with patient   50  minutes >50% time spent counseling; reviewing medical record; tests; labs; and developing future plan of care    Ok Edwards NP [facility]  Contact [phone] Monday through Friday 8am- 5pm  After hours call [phone]

## 2015-06-08 ENCOUNTER — Non-Acute Institutional Stay (SKILLED_NURSING_FACILITY): Payer: Medicare Other | Admitting: Adult Health

## 2015-06-08 ENCOUNTER — Other Ambulatory Visit (HOSPITAL_COMMUNITY): Payer: Self-pay | Admitting: *Deleted

## 2015-06-08 ENCOUNTER — Encounter: Payer: Self-pay | Admitting: Adult Health

## 2015-06-08 DIAGNOSIS — D649 Anemia, unspecified: Secondary | ICD-10-CM

## 2015-06-08 DIAGNOSIS — K279 Peptic ulcer, site unspecified, unspecified as acute or chronic, without hemorrhage or perforation: Secondary | ICD-10-CM

## 2015-06-08 NOTE — Progress Notes (Signed)
Patient ID: [name], female   DOB: 1946/04/11, 69 y.o.   MRN: [id number]   Facility: [facility]      Allergies  Allergen Reactions  . Penicillins Hives    Has patient had a PCN reaction causing immediate rash, facial/tongue/throat swelling, SOB or lightheadedness with hypotension: No Has patient had a PCN reaction causing severe rash involving mucus membranes or skin necrosis: No Has patient had a PCN reaction that required hospitalization No Has patient had a PCN reaction occurring within the last 10 years: No If all of the above answers are "NO", then may proceed with Cephalosporin use.   . Lipitor [Atorvastatin] Swelling    Chief Complaint  Patient presents with  . Acute Visit    follow up blood work     HPI:  Her hgb is 6.4 from 9.4. She has a history of diverticular bleed in the past. She states that she has had a colonoscopy without signs of bleeding present. She denies any changes in her stools; no known blood present. She denies any abdominal pain; chest pain or shortness of breath. She is able to participate in therapy.  We had a prolonged discussion. She will need an outpatient transfusion; she has agreed to have this done.   Past Medical History  Diagnosis Date  . Alcoholic cirrhosis ([place])   . Reflux esophagitis   . Gastroesophageal reflux   . Colon polyps   . CHF (congestive heart failure) ([place]) 10/13    grade 1 diastolic dysfunction, Nl LVF  . Hypertension   . History of GI diverticular bleed   . Pneumonia     history of  . Asthma   . Anemia   . Blood transfusion few years ago  . Villous adenoma of colon 05/10/11  . PVD (peripheral vascular disease) ([place]) 2009    bilat iliac stenting  . Chronic obstructive pulmonary disease (COPD) ([place])   . Arthritis   . Glaucoma     left  . Gout   . Acute venous embolism and thrombosis of deep vessels of proximal lower extremity ([place])   . Back pain   . Leg swelling   . Diabetes mellitus    insulin dep   . Hepatitis     alcoholic hepatitis  . Depression   . Anxiety   . On home oxygen therapy oxygen 2 liter per minute prn per [place]  . Cellulitis     bilateral lower extremities  . Chronic diastolic congestive heart failure [facility])     Past Surgical History  Procedure Laterality Date  . Cataract extraction Right yrs ago  . Colonoscopy    . Laparoscopic assissted total colectomy w/ j-pouch  04/22/11  . Ventral hernia repair  06/13/2012    Procedure: LAPAROSCOPIC VENTRAL HERNIA;  Surgeon: Adin Hector, MD;  Location: [place];  Service: General;  Laterality: N/A;  Laparoscopic Assisted Ventral Hernia with Mesh  . Abdominal hysterectomy  yrs ago  . Esophagogastroduodenoscopy (egd) with propofol N/A 08/06/2013    Procedure: ESOPHAGOGASTRODUODENOSCOPY (EGD) WITH PROPOFOL;  Surgeon: Lear Ng, MD;  [facility];  Service: Endoscopy;  Laterality: N/A;  . Colonoscopy with propofol N/A 08/06/2013    Procedure: COLONOSCOPY WITH PROPOFOL;  Surgeon: Lear Ng, MD;  Location[facility];  Service: Endoscopy;  Laterality: N/A;    VITAL SIGNS BP 144/63 mmHg  Pulse 71  Ht 5' (1.524 m)  Wt 125 lb (56.7 kg)  BMI 24.41 kg/m2  SpO2 95%  Patient's  Medications  New Prescriptions   No medications on file  Previous Medications   ALBUTEROL (PROVENTIL HFA;VENTOLIN HFA) 108 (90 BASE) MCG/ACT INHALER    Inhale 2 puffs into the lungs every 6 (six) hours as needed. For shortness of breath   ALBUTEROL (PROVENTIL) (2.5 MG/3ML) 0.083% NEBULIZER SOLUTION    Take 2.5 mg by nebulization every 6 (six) hours as needed for wheezing or shortness of breath.   ALLOPURINOL (ZYLOPRIM) 100 MG TABLET    Take 100 mg by mouth 2 (two) times daily.   ASPIRIN 81 MG EC TABLET    Take 81 mg by mouth daily.     CARVEDILOL (COREG) 6.25 MG TABLET    Take 1 tablet (6.25 mg total) by mouth 2 (two) times daily with a meal.   CILOSTAZOL (PLETAL) 100 MG TABLET    Take 50 mg by mouth 2 (two) times  daily before a meal.    CLONIDINE (CATAPRES) 0.1 MG TABLET    Take 1 tablet (0.1 mg total) by mouth 3 (three) times daily.   ERGOCALCIFEROL (VITAMIN D2) 50000 UNITS CAPSULE    Take 50,000 Units by mouth every Monday. On Monday   FLUTICASONE (FLONASE) 50 MCG/ACT NASAL SPRAY    Place 2 sprays into the nose daily as needed for allergies.    GLUCOSE MONITORING KIT (FREESTYLE) MONITORING KIT    1 each by Does not apply route 4 (four) times daily - after meals and at bedtime. 1 month Diabetic Testing Supplies for QAC-QHS accuchecks.Any brand OK   MAGNESIUM OXIDE 250 MG TABS    Take 1 tablet (250 mg total) by mouth daily.   METOPROLOL (LOPRESSOR) 50 MG TABLET    Take 25 mg by mouth 2 (two) times daily.   MONTELUKAST (SINGULAIR) 10 MG TABLET    Take 10 mg by mouth at bedtime.    OMEPRAZOLE (PRILOSEC) 40 MG CAPSULE    Take 40 mg by mouth daily.    OXYGEN-HELIUM IN    Inhale 2 L into the lungs at bedtime. 2 liters nightly   SIMVASTATIN (ZOCOR) 10 MG TABLET    Take 10 mg by mouth daily.    TRAMADOL (ULTRAM) 50 MG TABLET    Take 1 tablet (50 mg total) by mouth every 6 (six) hours as needed.  Modified Medications   No medications on file  Discontinued Medications   No medications on file     SIGNIFICANT DIAGNOSTIC EXAMS   02/2013: ABI: 0.68 and 0.69   05-09-15: ct of abdomen and pelvis: Calcifications in the pancreas consistent with chronic pancreatitis. Prominence the gastric wall may be due to thickening or normal under distention. Gastritis is not excluded. No evidence of bowel obstruction. Diverticulosis of sigmoid colon without evidence of diverticulitis. Inferior ventral abdominal wall hernia containing fat.  06-02-15: right shoulder x-ray: Impacted right  humeral neck fracture.  Osteopenia.   LABS REVIEWED:   05-10-15: hgb a1c 7.2 06-02-15; wbc 10.4; hgb 8.3; hct 25.8; mcv 85.1; plt 287; glucose 153; bun 20; creat 1.26; k+ 3.6; na++137; liver normal albumin 2.1  06-03-15: wbc 16.8; hgb 9.5;  hct 29.3; mcv 84.4; plt 365; glucose 198; bun 20; creat 1.04; k+ 3.6; na++137; liver normal albumin 2.4 06-08-15: wbc 8.6; hgb 6.4; hct 20.3; mcv 85.7; plt 289       Review of Systems  Constitutional: Negative for appetite change and fatigue.  HENT: Negative for congestion.   Respiratory: Negative for cough, chest tightness and shortness of breath.   Cardiovascular: Negative  for chest pain, palpitations and leg swelling.  Gastrointestinal: Negative for nausea, abdominal pain, diarrhea and constipation.  Musculoskeletal: Negative for myalgias and arthralgias.  Skin: Negative for pallor.  Neurological: Negative for dizziness.  Psychiatric/Behavioral: The patient is not nervous/anxious.     Physical Exam Constitutional: No distress.  Frail   Eyes: Conjunctivae are normal.  Neck: Neck supple. No JVD present. No thyromegaly present.  Cardiovascular: Normal rate and regular rhythm.   Bilateral lower extremities discolored due to pvd Pedal pulses very faint   Respiratory: Effort normal and breath sounds normal. No respiratory distress. She has no wheezes.  GI: Soft. Bowel sounds are normal. She exhibits no distension. There is no tenderness.  Musculoskeletal: She exhibits no edema.  Right arm in sling Able to move all other extremities   Lymphadenopathy:    She has no cervical adenopathy.  Neurological: She is alert.  Skin: Skin is warm and dry. She is not diaphoretic.  Psychiatric: She has a normal mood and affect.     ASSESSMENT/ PLAN:  1. Anemia with a history of GI bleed: will setup an outpatient transfusion of 2 units packed red blood cells. Will give tylenol benadray and lasix in between units. Will setup GI consult for her GI bleed; will guaiac stool X3 and will check iron studies in the am. Will begin iron 325 mg daily and will hold asa for one week  Will continue to monitor her status.     Time spent with patient  40  minutes >50% time spent counseling; reviewing medical  record; tests; labs; and developing future plan of care   Ok Edwards NP [facility]  Contact [phone] Monday through Friday 8am- 5pm  After hours call [phone]

## 2015-06-09 ENCOUNTER — Ambulatory Visit (HOSPITAL_COMMUNITY)
Admission: RE | Admit: 2015-06-09 | Discharge: 2015-06-09 | Disposition: A | Discharge status: Home / Self Care | Payer: Medicare Other | Source: Ambulatory Visit | Attending: Adult Health | Admitting: Adult Health

## 2015-06-09 DIAGNOSIS — D649 Anemia, unspecified: Secondary | ICD-10-CM | POA: Insufficient documentation

## 2015-06-09 LAB — GLUCOSE, CAPILLARY
GLUCOSE-CAPILLARY: 104 mg/dL — AB (ref 65–99)
GLUCOSE-CAPILLARY: 172 mg/dL — AB (ref 65–99)

## 2015-06-09 LAB — PREPARE RBC (CROSSMATCH)

## 2015-06-09 MED ORDER — ACETAMINOPHEN 325 MG PO TABS
650.0000 mg | ORAL_TABLET | Freq: Once | ORAL | Status: AC
  Administered 2015-06-09: 650 mg via ORAL

## 2015-06-09 MED ORDER — ACETAMINOPHEN 325 MG PO TABS
ORAL_TABLET | ORAL | Status: AC
  Filled 2015-06-09: qty 2

## 2015-06-09 MED ORDER — FUROSEMIDE 20 MG PO TABS
20.0000 mg | ORAL_TABLET | Freq: Once | ORAL | Status: AC
  Administered 2015-06-09: 20 mg via ORAL
  Filled 2015-06-09 (×2): qty 1

## 2015-06-09 MED ORDER — CLONIDINE HCL 0.1 MG PO TABS
0.1000 mg | ORAL_TABLET | Freq: Once | ORAL | Status: AC
  Administered 2015-06-09: 0.1 mg via ORAL

## 2015-06-09 MED ORDER — DIPHENHYDRAMINE HCL 25 MG PO CAPS
25.0000 mg | ORAL_CAPSULE | Freq: Once | ORAL | Status: AC
  Administered 2015-06-09: 25 mg via ORAL

## 2015-06-09 MED ORDER — SODIUM CHLORIDE 0.9 % IV SOLN
Freq: Once | INTRAVENOUS | Status: AC
  Administered 2015-06-09: 11:00:00 via INTRAVENOUS

## 2015-06-09 MED ORDER — DIPHENHYDRAMINE HCL 25 MG PO CAPS
ORAL_CAPSULE | ORAL | Status: AC
  Filled 2015-06-09: qty 1

## 2015-06-09 MED ORDER — CLONIDINE HCL 0.1 MG PO TABS
ORAL_TABLET | ORAL | Status: AC
  Filled 2015-06-09: qty 1

## 2015-06-10 LAB — TYPE AND SCREEN
ABO/RH(D): O POS
ANTIBODY SCREEN: POSITIVE
DAT, IgG: NEGATIVE
Donor AG Type: NEGATIVE
Donor AG Type: NEGATIVE
UNIT DIVISION: 0
UNIT DIVISION: 0

## 2015-06-16 ENCOUNTER — Non-Acute Institutional Stay (SKILLED_NURSING_FACILITY): Payer: Medicare Other | Admitting: Adult Health

## 2015-06-16 DIAGNOSIS — M25421 Effusion, right elbow: Secondary | ICD-10-CM | POA: Diagnosis not present

## 2015-06-16 DIAGNOSIS — E118 Type 2 diabetes mellitus with unspecified complications: Secondary | ICD-10-CM

## 2015-06-16 DIAGNOSIS — Z794 Long term (current) use of insulin: Secondary | ICD-10-CM | POA: Diagnosis not present

## 2015-06-16 DIAGNOSIS — S42291D Other displaced fracture of upper end of right humerus, subsequent encounter for fracture with routine healing: Secondary | ICD-10-CM | POA: Diagnosis not present

## 2015-06-18 ENCOUNTER — Non-Acute Institutional Stay (SKILLED_NURSING_FACILITY): Payer: Medicare Other | Admitting: Internal Medicine

## 2015-06-18 ENCOUNTER — Encounter: Payer: Self-pay | Admitting: Internal Medicine

## 2015-06-18 DIAGNOSIS — I5032 Chronic diastolic (congestive) heart failure: Secondary | ICD-10-CM

## 2015-06-18 DIAGNOSIS — I739 Peripheral vascular disease, unspecified: Secondary | ICD-10-CM

## 2015-06-18 DIAGNOSIS — N183 Chronic kidney disease, stage 3 unspecified: Secondary | ICD-10-CM

## 2015-06-18 DIAGNOSIS — J449 Chronic obstructive pulmonary disease, unspecified: Secondary | ICD-10-CM | POA: Diagnosis not present

## 2015-06-18 DIAGNOSIS — K7030 Alcoholic cirrhosis of liver without ascites: Secondary | ICD-10-CM

## 2015-06-18 DIAGNOSIS — M1A09X0 Idiopathic chronic gout, multiple sites, without tophus (tophi): Secondary | ICD-10-CM | POA: Diagnosis not present

## 2015-06-18 DIAGNOSIS — C189 Malignant neoplasm of colon, unspecified: Secondary | ICD-10-CM | POA: Diagnosis not present

## 2015-06-18 DIAGNOSIS — E118 Type 2 diabetes mellitus with unspecified complications: Secondary | ICD-10-CM

## 2015-06-18 DIAGNOSIS — D649 Anemia, unspecified: Secondary | ICD-10-CM

## 2015-06-18 DIAGNOSIS — Z794 Long term (current) use of insulin: Secondary | ICD-10-CM

## 2015-06-18 DIAGNOSIS — I10 Essential (primary) hypertension: Secondary | ICD-10-CM | POA: Diagnosis not present

## 2015-06-18 DIAGNOSIS — S42291D Other displaced fracture of upper end of right humerus, subsequent encounter for fracture with routine healing: Secondary | ICD-10-CM

## 2015-06-18 DIAGNOSIS — E785 Hyperlipidemia, unspecified: Secondary | ICD-10-CM

## 2015-06-18 NOTE — Progress Notes (Signed)
Patient ID: [name], female   DOB: 05/21/1946, 69 y.o.   MRN: [id number]    HISTORY AND PHYSICAL   DATE: 06/18/15  Location:  [facility]    Place of Service: SNF [phone])   Extended Emergency Contact Information Primary Emergency Contact: [place] Phone: [phone] Relation: Relative Secondary Emergency Contact: Silvano Bilis, [place] of [place] Phone: [phone] Relation: Aunt  Advanced Directive information  FULL CODE  Chief Complaint  Patient presents with  . New Admit To SNF    HPI:  69 yo female seen today as a new admission into SNF following ED visit for right humerus fx s/p fall, FTT and anemia. She reports feeling depressed as her mother expired on Sunday and her funeral is tomorrow. Pain controlled. No nursing issues. No falls. Appetite ok. Hgb dropped to 6.4 upon SNF admission. She was transfused 2 units PRBCs in o/p short stay. She was asymptomatic  COPD - stable on singulair 10 mg nightly; albuterol inhaler and neb treatment every 6 hours as needed, flonase daily as needed for allergic rhinitis. She is O2 dependent  Gout - no acute flares. Takes allopurinol 100 mg twice daily   Dyslipidemia - stable on zocor 10 mg daily   PVD - stable on pletal 100 mg twice daily and asa 81 mg daily   Hypertension - BP controlled on clonidine 0.1 mg three times daily, coreg 12.5 mg twice daily. She takes ASA daily  Chronic diastolic heart failure - stable on coreg at 12.5 mg twice daily, asa 81 mg daily   PUD - hx GI bleed in 2011. Stable on prilosec 40 mg daily   Vit d deficiency - stable on  vit d 50,000 units weekly  Cirrhosis of liver - Etoh related. Stable liver enzymes   Chronic kidney disease stage III - stable Cr 1.04  Past Medical History  Diagnosis Date  . Alcoholic cirrhosis ([place])   . Reflux esophagitis   . Gastroesophageal reflux   . Colon polyps   . CHF  (congestive heart failure) ([place]) 10/13    grade 1 diastolic dysfunction, Nl LVF  . Hypertension   . History of GI diverticular bleed   . Pneumonia     history of  . Asthma   . Anemia   . Blood transfusion few years ago  . Villous adenoma of colon 05/10/11  . PVD (peripheral vascular disease) ([place]) 2009    bilat iliac stenting  . Chronic obstructive pulmonary disease (COPD) ([place])   . Arthritis   . Glaucoma     left  . Gout   . Acute venous embolism and thrombosis of deep vessels of proximal lower extremity ([place])   . Back pain   . Leg swelling   . Diabetes mellitus     insulin dep   . Hepatitis     alcoholic hepatitis  . Depression   . Anxiety   . On home oxygen therapy oxygen 2 liter per minute prn per [place]  . Cellulitis     bilateral lower extremities  . Chronic diastolic congestive heart failure [facility])     Past Surgical History  Procedure Laterality Date  . Cataract extraction Right yrs ago  . Colonoscopy    . Laparoscopic assissted total colectomy w/ j-pouch  04/22/11  . Ventral hernia repair  06/13/2012    Procedure: LAPAROSCOPIC VENTRAL HERNIA;  Surgeon: Edsel Petrin  Dalbert Batman, MD;  Location: [place];  Service: General;  Laterality: N/A;  Laparoscopic Assisted Ventral Hernia with Mesh  . Abdominal hysterectomy  yrs ago  . Esophagogastroduodenoscopy (egd) with propofol N/A 08/06/2013    Procedure: ESOPHAGOGASTRODUODENOSCOPY (EGD) WITH PROPOFOL;  Surgeon: Lear Ng, MD;  Location: [facility];  Service: Endoscopy;  Laterality: N/A;  . Colonoscopy with propofol N/A 08/06/2013    Procedure: COLONOSCOPY WITH PROPOFOL;  Surgeon: Lear Ng, MD;  Location: [facility];  Service: Endoscopy;  Laterality: N/A;    Patient Care Team: Nolene Ebbs, MD as PCP - General (Internal Medicine)  Social History   Social History  . Marital Status: Single    Spouse Name: N/A  . Number of Children: N/A  . Years of Education: N/A   Occupational History  . Not on file.    Social History Main Topics  . Smoking status: Former Smoker -- 0.50 packs/day for 30 years    Types: Cigarettes    Quit date: 11/06/2005  . Smokeless tobacco: Never Used  . Alcohol Use: No     Comment: stopped 2005  . Drug Use: No  . Sexual Activity: Not on file   Other Topics Concern  . Not on file   Social History Narrative     reports that she quit smoking about 9 years ago. Her smoking use included Cigarettes. She has a 15 pack-year smoking history. She has never used smokeless tobacco. She reports that she does not drink alcohol or use illicit drugs.  Family History  Problem Relation Age of Onset  . Cancer Brother     heent ca  . Cancer Sister     liver ca   Family Status  Relation Status Death Age  . Brother Deceased   . Sister Deceased   . Mother Deceased   . Father Alive   . Sister Alive   . Brother Alive   . Brother Alive   . Brother Deceased   . Brother Alive   . Brother Marketing executive History  Administered Date(s) Administered  . Pneumococcal Polysaccharide-23 06/18/2012    Allergies  Allergen Reactions  . Penicillins Hives    Has patient had a PCN reaction causing immediate rash, facial/tongue/throat swelling, SOB or lightheadedness with hypotension: No Has patient had a PCN reaction causing severe rash involving mucus membranes or skin necrosis: No Has patient had a PCN reaction that required hospitalization No Has patient had a PCN reaction occurring within the last 10 years: No If all of the above answers are "NO", then may proceed with Cephalosporin use.   . Lipitor [Atorvastatin] Swelling    Medications: Patient's Medications  New Prescriptions   No medications on file  Previous Medications   ALBUTEROL (PROVENTIL HFA;VENTOLIN HFA) 108 (90 BASE) MCG/ACT INHALER    Inhale 2 puffs into the lungs every 6 (six) hours as needed. For shortness of breath   ALBUTEROL (PROVENTIL) (2.5 MG/3ML) 0.083% NEBULIZER SOLUTION    Take 2.5 mg by  nebulization every 6 (six) hours as needed for wheezing or shortness of breath.   ALLOPURINOL (ZYLOPRIM) 100 MG TABLET    Take 100 mg by mouth 2 (two) times daily.   ASPIRIN 81 MG EC TABLET    Take 81 mg by mouth daily.     CARVEDILOL (COREG) 6.25 MG TABLET    Take 1 tablet (6.25 mg total) by mouth 2 (two) times daily with a meal.   CILOSTAZOL (PLETAL) 100 MG TABLET  Take 50 mg by mouth 2 (two) times daily before a meal.    CLONIDINE (CATAPRES) 0.1 MG TABLET    Take 1 tablet (0.1 mg total) by mouth 3 (three) times daily.   ERGOCALCIFEROL (VITAMIN D2) 50000 UNITS CAPSULE    Take 50,000 Units by mouth every Monday. On Monday   FLUTICASONE (FLONASE) 50 MCG/ACT NASAL SPRAY    Place 2 sprays into the nose daily as needed for allergies.    GLUCOSE MONITORING KIT (FREESTYLE) MONITORING KIT    1 each by Does not apply route 4 (four) times daily - after meals and at bedtime. 1 month Diabetic Testing Supplies for QAC-QHS accuchecks.Any brand OK   MAGNESIUM OXIDE 250 MG TABS    Take 1 tablet (250 mg total) by mouth daily.   METOPROLOL (LOPRESSOR) 50 MG TABLET    Take 25 mg by mouth 2 (two) times daily.   MONTELUKAST (SINGULAIR) 10 MG TABLET    Take 10 mg by mouth at bedtime.    OMEPRAZOLE (PRILOSEC) 40 MG CAPSULE    Take 40 mg by mouth daily.    OXYGEN-HELIUM IN    Inhale 2 L into the lungs at bedtime. 2 liters nightly   SIMVASTATIN (ZOCOR) 10 MG TABLET    Take 10 mg by mouth daily.    TRAMADOL (ULTRAM) 50 MG TABLET    Take 1 tablet (50 mg total) by mouth every 6 (six) hours as needed.  Modified Medications   No medications on file  Discontinued Medications   No medications on file    Review of Systems  Musculoskeletal: Positive for joint swelling and arthralgias.  Psychiatric/Behavioral: Positive for dysphoric mood.  All other systems reviewed and are negative.   Filed Vitals:   06/18/15 2227  BP: 160/88  Pulse: 80  Temp: 97.8 F (36.6 C)  Weight: 124 lb 3.2 oz (56.337 kg)  SpO2: 95%    Body mass index is 24.26 kg/(m^2).  Physical Exam  Constitutional: She is oriented to person, place, and time. She appears well-developed.  Sitting in w/c in NAD, frail appearing  HENT:  Mouth/Throat: Oropharynx is clear and moist. No oropharyngeal exudate.  Eyes: Pupils are equal, round, and reactive to light. No scleral icterus.  Neck: Neck supple. Carotid bruit is not present. No tracheal deviation present. No thyromegaly present.  Cardiovascular: Normal rate, regular rhythm, normal heart sounds and intact distal pulses.  Exam reveals no gallop and no friction rub.   No murmur heard. R>L +1 pitting LE edema. No calf TTP   Pulmonary/Chest: Effort normal and breath sounds normal. No stridor. No respiratory distress. She has no wheezes. She has no rales.  Abdominal: Soft. Bowel sounds are normal. She exhibits no distension and no mass. There is no hepatomegaly. There is no tenderness. There is no rebound and no guarding.  Musculoskeletal: She exhibits edema (right humeral sweeling --> hand. FROM fingers) and tenderness.  Lymphadenopathy:    She has no cervical adenopathy.  Neurological: She is alert and oriented to person, place, and time. She has normal reflexes.  Skin: Skin is warm and dry. No rash noted.  Psychiatric: She has a normal mood and affect. Her behavior is normal. Judgment and thought content normal.     Labs reviewed: Hospital Outpatient Visit on 06/09/2015  Component Date Value Ref Range Status  . ABO/RH(D) 06/09/2015 O POS   Final  . Antibody Screen 06/09/2015 POS   Final  . Sample Expiration 06/09/2015 06/12/2015   Final  .  DAT, IgG 06/09/2015 NEG   Final  . PT AG Type 06/09/2015 ANTI M   Final  . Unit Number 06/09/2015 [id number]   Final  . Blood Component Type 06/09/2015 RED CELLS,LR   Final  . Unit division 06/09/2015 00   Final  . Status of Unit 06/09/2015 ISSUED,FINAL   Final  . Donor AG Type 06/09/2015 NEGATIVE FOR E ANTIGEN NEGATIVE FOR M ANTIGEN    Final  . Transfusion Status 06/09/2015 OK TO TRANSFUSE   Final  . Crossmatch Result 06/09/2015 COMPATIBLE   Final  . Unit Number 06/09/2015 [id number]   Final  . Blood Component Type 06/09/2015 RED CELLS,LR   Final  . Unit division 06/09/2015 00   Final  . Status of Unit 06/09/2015 ISSUED,FINAL   Final  . Donor AG Type 06/09/2015 NEGATIVE FOR M ANTIGEN NEGATIVE FOR E ANTIGEN   Final  . Transfusion Status 06/09/2015 OK TO TRANSFUSE   Final  . Crossmatch Result 06/09/2015 COMPATIBLE   Final  . Order Confirmation 06/09/2015 ORDER PROCESSED BY BLOOD BANK   Final  . Glucose-Capillary 06/09/2015 104* 65 - 99 mg/dL Final  . Glucose-Capillary 06/09/2015 172* 65 - 99 mg/dL Final  Admission on 06/02/2015, Discharged on 06/03/2015  Component Date Value Ref Range Status  . WBC 06/03/2015 16.8* 4.0 - 10.5 K/uL Final  . RBC 06/03/2015 3.47* 3.87 - 5.11 MIL/uL Final  . Hemoglobin 06/03/2015 9.5* 12.0 - 15.0 g/dL Final  . HCT 06/03/2015 29.3* 36.0 - 46.0 % Final  . MCV 06/03/2015 84.4  78.0 - 100.0 fL Final  . MCH 06/03/2015 27.4  26.0 - 34.0 pg Final  . MCHC 06/03/2015 32.4  30.0 - 36.0 g/dL Final  . RDW 06/03/2015 15.5  11.5 - 15.5 % Final  . Platelets 06/03/2015 365  150 - 400 K/uL Final  . Neutrophils Relative % 06/03/2015 87   Final  . Neutro Abs 06/03/2015 14.5* 1.7 - 7.7 K/uL Final  . Lymphocytes Relative 06/03/2015 7   Final  . Lymphs Abs 06/03/2015 1.2  0.7 - 4.0 K/uL Final  . Monocytes Relative 06/03/2015 6   Final  . Monocytes Absolute 06/03/2015 1.1* 0.1 - 1.0 K/uL Final  . Eosinophils Relative 06/03/2015 0   Final  . Eosinophils Absolute 06/03/2015 0.0  0.0 - 0.7 K/uL Final  . Basophils Relative 06/03/2015 0   Final  . Basophils Absolute 06/03/2015 0.0  0.0 - 0.1 K/uL Final  . Sodium 06/03/2015 137  135 - 145 mmol/L Final  . Potassium 06/03/2015 3.6  3.5 - 5.1 mmol/L Final  . Chloride 06/03/2015 114* 101 - 111 mmol/L Final  . CO2 06/03/2015 19* 22 - 32 mmol/L Final  . Glucose,  Bld 06/03/2015 189* 65 - 99 mg/dL Final  . BUN 06/03/2015 20  6 - 20 mg/dL Final  . Creatinine, Ser 06/03/2015 1.04* 0.44 - 1.00 mg/dL Final  . Calcium 06/03/2015 8.3* 8.9 - 10.3 mg/dL Final  . Total Protein 06/03/2015 6.1* 6.5 - 8.1 g/dL Final  . Albumin 06/03/2015 2.4* 3.5 - 5.0 g/dL Final  . AST 06/03/2015 29  15 - 41 U/L Final  . ALT 06/03/2015 15  14 - 54 U/L Final  . Alkaline Phosphatase 06/03/2015 141* 38 - 126 U/L Final  . Total Bilirubin 06/03/2015 0.8  0.3 - 1.2 mg/dL Final  . GFR calc non Af Amer 06/03/2015 53* >60 mL/min Final  . GFR calc Af Amer 06/03/2015 >60  >60 mL/min Final   Comment: (NOTE) The eGFR has  been calculated using the CKD EPI equation. This calculation has not been validated in all clinical situations. eGFR's persistently <60 mL/min signify possible Chronic Kidney Disease.   . Anion gap 06/03/2015 4* 5 - 15 Final  . Glucose-Capillary 06/03/2015 158* 65 - 99 mg/dL Final  Admission on 06/02/2015, Discharged on 06/02/2015  Component Date Value Ref Range Status  . Sodium 06/02/2015 137  135 - 145 mmol/L Final  . Potassium 06/02/2015 3.6  3.5 - 5.1 mmol/L Final  . Chloride 06/02/2015 113* 101 - 111 mmol/L Final  . CO2 06/02/2015 19* 22 - 32 mmol/L Final  . Glucose, Bld 06/02/2015 153* 65 - 99 mg/dL Final  . BUN 06/02/2015 20  6 - 20 mg/dL Final  . Creatinine, Ser 06/02/2015 1.26* 0.44 - 1.00 mg/dL Final  . Calcium 06/02/2015 7.8* 8.9 - 10.3 mg/dL Final  . Total Protein 06/02/2015 5.1* 6.5 - 8.1 g/dL Final  . Albumin 06/02/2015 2.1* 3.5 - 5.0 g/dL Final  . AST 06/02/2015 28  15 - 41 U/L Final  . ALT 06/02/2015 16  14 - 54 U/L Final  . Alkaline Phosphatase 06/02/2015 125  38 - 126 U/L Final  . Total Bilirubin 06/02/2015 0.6  0.3 - 1.2 mg/dL Final  . GFR calc non Af Amer 06/02/2015 42* >60 mL/min Final  . GFR calc Af Amer 06/02/2015 49* >60 mL/min Final   Comment: (NOTE) The eGFR has been calculated using the CKD EPI equation. This calculation has not been  validated in all clinical situations. eGFR's persistently <60 mL/min signify possible Chronic Kidney Disease.   . Anion gap 06/02/2015 5  5 - 15 Final  . WBC 06/02/2015 10.4  4.0 - 10.5 K/uL Final  . RBC 06/02/2015 3.03* 3.87 - 5.11 MIL/uL Final  . Hemoglobin 06/02/2015 8.3* 12.0 - 15.0 g/dL Final  . HCT 06/02/2015 25.8* 36.0 - 46.0 % Final  . MCV 06/02/2015 85.1  78.0 - 100.0 fL Final  . MCH 06/02/2015 27.4  26.0 - 34.0 pg Final  . MCHC 06/02/2015 32.2  30.0 - 36.0 g/dL Final  . RDW 06/02/2015 15.6* 11.5 - 15.5 % Final  . Platelets 06/02/2015 287  150 - 400 K/uL Final  . Neutrophils Relative % 06/02/2015 77   Final  . Neutro Abs 06/02/2015 8.0* 1.7 - 7.7 K/uL Final  . Lymphocytes Relative 06/02/2015 16   Final  . Lymphs Abs 06/02/2015 1.6  0.7 - 4.0 K/uL Final  . Monocytes Relative 06/02/2015 7   Final  . Monocytes Absolute 06/02/2015 0.7  0.1 - 1.0 K/uL Final  . Eosinophils Relative 06/02/2015 0   Final  . Eosinophils Absolute 06/02/2015 0.0  0.0 - 0.7 K/uL Final  . Basophils Relative 06/02/2015 0   Final  . Basophils Absolute 06/02/2015 0.0  0.0 - 0.1 K/uL Final  . Lactic Acid, Venous 06/02/2015 0.98  0.5 - 2.0 mmol/L Final  . Glucose-Capillary 06/02/2015 129* 65 - 99 mg/dL Final  . FIO2 06/02/2015 0.21   Final  . Delivery systems 06/02/2015 ROOM AIR   Final  . pH, Arterial 06/02/2015 7.417  7.350 - 7.450 Final  . pCO2 arterial 06/02/2015 26.5* 35.0 - 45.0 mmHg Final  . pO2, Arterial 06/02/2015 185* 80.0 - 100.0 mmHg Final  . Bicarbonate 06/02/2015 16.8* 20.0 - 24.0 mEq/L Final  . TCO2 06/02/2015 15.8  0 - 100 mmol/L Final  . Acid-base deficit 06/02/2015 6.5* 0.0 - 2.0 mmol/L Final  . O2 Saturation 06/02/2015 99.3   Final  . Patient  temperature 06/02/2015 98.6   Final  . Collection site 06/02/2015 BRACHIAL ARTERY   Final  . Drawn by 06/02/2015 220254   Final  . Sample type 06/02/2015 ARTERIAL DRAW   Final  . [place] Reading test (pass/fail) 06/02/2015 PASS  PASS Final  .  Glucose-Capillary 06/02/2015 118* 65 - 99 mg/dL Final  Admission on 05/09/2015, Discharged on 05/12/2015  Component Date Value Ref Range Status  . Lipase 05/09/2015 18* 22 - 51 U/L Final  . Sodium 05/09/2015 134* 135 - 145 mmol/L Final  . Potassium 05/09/2015 2.4* 3.5 - 5.1 mmol/L Final   Comment: CRITICAL RESULT CALLED TO, READ BACK BY AND VERIFIED WITH: KECHERT,K AT 1940 ON [id number] BY HOOKER,B   . Chloride 05/09/2015 100* 101 - 111 mmol/L Final  . CO2 05/09/2015 24  22 - 32 mmol/L Final  . Glucose, Bld 05/09/2015 116* 65 - 99 mg/dL Final  . BUN 05/09/2015 32* 6 - 20 mg/dL Final  . Creatinine, Ser 05/09/2015 2.06* 0.44 - 1.00 mg/dL Final  . Calcium 05/09/2015 8.1* 8.9 - 10.3 mg/dL Final  . Total Protein 05/09/2015 5.9* 6.5 - 8.1 g/dL Final  . Albumin 05/09/2015 2.6* 3.5 - 5.0 g/dL Final  . AST 05/09/2015 44* 15 - 41 U/L Final  . ALT 05/09/2015 22  14 - 54 U/L Final  . Alkaline Phosphatase 05/09/2015 108  38 - 126 U/L Final  . Total Bilirubin 05/09/2015 0.5  0.3 - 1.2 mg/dL Final  . GFR calc non Af Amer 05/09/2015 23* >60 mL/min Final  . GFR calc Af Amer 05/09/2015 27* >60 mL/min Final   Comment: (NOTE) The eGFR has been calculated using the CKD EPI equation. This calculation has not been validated in all clinical situations. eGFR's persistently <60 mL/min signify possible Chronic Kidney Disease.   . Anion gap 05/09/2015 10  5 - 15 Final  . WBC 05/09/2015 13.2* 4.0 - 10.5 K/uL Final  . RBC 05/09/2015 3.49* 3.87 - 5.11 MIL/uL Final  . Hemoglobin 05/09/2015 9.7* 12.0 - 15.0 g/dL Final  . HCT 05/09/2015 29.7* 36.0 - 46.0 % Final  . MCV 05/09/2015 85.1  78.0 - 100.0 fL Final  . MCH 05/09/2015 27.8  26.0 - 34.0 pg Final  . MCHC 05/09/2015 32.7  30.0 - 36.0 g/dL Final  . RDW 05/09/2015 14.0  11.5 - 15.5 % Final  . Platelets 05/09/2015 191  150 - 400 K/uL Final  . Color, Urine 05/09/2015 YELLOW  YELLOW Final  . APPearance 05/09/2015 CLOUDY* CLEAR Final  . Specific Gravity, Urine  05/09/2015 1.010  1.005 - 1.030 Final  . pH 05/09/2015 6.0  5.0 - 8.0 Final  . Glucose, UA 05/09/2015 NEGATIVE  NEGATIVE mg/dL Final  . Hgb urine dipstick 05/09/2015 SMALL* NEGATIVE Final  . Bilirubin Urine 05/09/2015 NEGATIVE  NEGATIVE Final  . Ketones, ur 05/09/2015 NEGATIVE  NEGATIVE mg/dL Final  . Protein, ur 05/09/2015 NEGATIVE  NEGATIVE mg/dL Final  . Urobilinogen, UA 05/09/2015 0.2  0.0 - 1.0 mg/dL Final  . Nitrite 05/09/2015 NEGATIVE  NEGATIVE Final  . Leukocytes, UA 05/09/2015 SMALL* NEGATIVE Final  . Glucose-Capillary 05/09/2015 113* 65 - 99 mg/dL Final  . Alcohol, Ethyl (B) 05/09/2015 <5  <5 mg/dL Final   Comment:        LOWEST DETECTABLE LIMIT FOR SERUM ALCOHOL IS 5 mg/dL FOR MEDICAL PURPOSES ONLY   . WBC, UA 05/09/2015 7-10  <3 WBC/hpf Final  . RBC / HPF 05/09/2015 0-2  <3 RBC/hpf Final  . Bacteria, UA 05/09/2015 MANY*  RARE Final  . Magnesium 05/10/2015 1.3* 1.7 - 2.4 mg/dL Final  . Hgb A1c MFr Bld 05/10/2015 7.2* 4.8 - 5.6 % Final   Comment: (NOTE)         Pre-diabetes: 5.7 - 6.4         Diabetes: >6.4         Glycemic control for adults with diabetes: <7.0   . Mean Plasma Glucose 05/10/2015 160   Final   Comment: (NOTE) Performed At: [facility] [place], [place] [phone] Lindon Romp MD [id number]   . Sodium 05/10/2015 138  135 - 145 mmol/L Final  . Potassium 05/10/2015 2.6* 3.5 - 5.1 mmol/L Final   Comment: CRITICAL RESULT CALLED TO, READ BACK BY AND VERIFIED WITH: T.PARKS AT 0727 ON 05/10/15 BY S.VANHOORNE   . Chloride 05/10/2015 108  101 - 111 mmol/L Final  . CO2 05/10/2015 21* 22 - 32 mmol/L Final  . Glucose, Bld 05/10/2015 96  65 - 99 mg/dL Final  . BUN 05/10/2015 26* 6 - 20 mg/dL Final  . Creatinine, Ser 05/10/2015 1.65* 0.44 - 1.00 mg/dL Final  . Calcium 05/10/2015 7.6* 8.9 - 10.3 mg/dL Final  . Total Protein 05/10/2015 5.0* 6.5 - 8.1 g/dL Final  . Albumin 05/10/2015 2.2* 3.5 - 5.0 g/dL Final  . AST 05/10/2015 30  15  - 41 U/L Final  . ALT 05/10/2015 17  14 - 54 U/L Final  . Alkaline Phosphatase 05/10/2015 91  38 - 126 U/L Final  . Total Bilirubin 05/10/2015 0.5  0.3 - 1.2 mg/dL Final  . GFR calc non Af Amer 05/10/2015 30* >60 mL/min Final  . GFR calc Af Amer 05/10/2015 35* >60 mL/min Final   Comment: (NOTE) The eGFR has been calculated using the CKD EPI equation. This calculation has not been validated in all clinical situations. eGFR's persistently <60 mL/min signify possible Chronic Kidney Disease.   . Anion gap 05/10/2015 9  5 - 15 Final  . WBC 05/10/2015 12.0* 4.0 - 10.5 K/uL Final  . RBC 05/10/2015 3.34* 3.87 - 5.11 MIL/uL Final  . Hemoglobin 05/10/2015 9.2* 12.0 - 15.0 g/dL Final  . HCT 05/10/2015 28.7* 36.0 - 46.0 % Final  . MCV 05/10/2015 85.9  78.0 - 100.0 fL Final  . MCH 05/10/2015 27.5  26.0 - 34.0 pg Final  . MCHC 05/10/2015 32.1  30.0 - 36.0 g/dL Final  . RDW 05/10/2015 14.2  11.5 - 15.5 % Final  . Platelets 05/10/2015 193  150 - 400 K/uL Final  . Lactic Acid, Venous 05/10/2015 4.0* 0.5 - 2.0 mmol/L Final   Comment: CRITICAL RESULT CALLED TO, READ BACK BY AND VERIFIED WITH: T.PARKS AT 1625 ON 05/10/15 BY S.VANHOORNE   . Procalcitonin 05/10/2015 <0.10   Final   Comment:        Interpretation: PCT (Procalcitonin) <= 0.5 ng/mL: Systemic infection (sepsis) is not likely. Local bacterial infection is possible. (NOTE)         ICU PCT Algorithm               Non ICU PCT Algorithm    ----------------------------     ------------------------------         PCT < 0.25 ng/mL                 PCT < 0.1 ng/mL     Stopping of antibiotics            Stopping of antibiotics  strongly encouraged.               strongly encouraged.    ----------------------------     ------------------------------       PCT level decrease by               PCT < 0.25 ng/mL       >= 80% from peak PCT       OR PCT 0.25 - 0.5 ng/mL          Stopping of antibiotics                                              encouraged.     Stopping of antibiotics           encouraged.    ----------------------------     ------------------------------       PCT level decrease by              PCT >= 0.25 ng/mL       < 80% from peak PCT        AND PCT >= 0.5 ng/mL            Continuin                          g antibiotics                                              encouraged.       Continuing antibiotics            encouraged.    ----------------------------     ------------------------------     PCT level increase compared          PCT > 0.5 ng/mL         with peak PCT AND          PCT >= 0.5 ng/mL             Escalation of antibiotics                                          strongly encouraged.      Escalation of antibiotics        strongly encouraged.   . Prothrombin Time 05/10/2015 13.1  11.6 - 15.2 seconds Final  . INR 05/10/2015 0.97  0.00 - 1.49 Final  . aPTT 05/10/2015 26  24 - 37 seconds Final  . Glucose-Capillary 05/10/2015 62* 65 - 99 mg/dL Final  . Glucose-Capillary 05/10/2015 139* 65 - 99 mg/dL Final  . Glucose-Capillary 05/10/2015 56* 65 - 99 mg/dL Final  . Glucose-Capillary 05/10/2015 131* 65 - 99 mg/dL Final  . Comment 1 05/10/2015 Notify RN   Final  . Glucose-Capillary 05/10/2015 194* 65 - 99 mg/dL Final  . Comment 1 05/10/2015 Notify RN   Final  . Glucose-Capillary 05/10/2015 149* 65 - 99 mg/dL Final  . Glucose-Capillary 05/10/2015 156* 65 - 99 mg/dL Final  . Lactic Acid, Venous 05/11/2015 1.6  0.5 - 2.0 mmol/L Final  . Sodium 05/11/2015 136  135 - 145 mmol/L Final  . Potassium 05/11/2015  5.9* 3.5 - 5.1 mmol/L Final   Comment: DELTA CHECK NOTED REPEATED TO VERIFY NO VISIBLE HEMOLYSIS   . Chloride 05/11/2015 111  101 - 111 mmol/L Final  . CO2 05/11/2015 19* 22 - 32 mmol/L Final  . Glucose, Bld 05/11/2015 76  65 - 99 mg/dL Final  . BUN 05/11/2015 19  6 - 20 mg/dL Final  . Creatinine, Ser 05/11/2015 1.37* 0.44 - 1.00 mg/dL Final  . Calcium 05/11/2015 8.4* 8.9 - 10.3 mg/dL Final   . Total Protein 05/11/2015 6.0* 6.5 - 8.1 g/dL Final  . Albumin 05/11/2015 2.6* 3.5 - 5.0 g/dL Final  . AST 05/11/2015 47* 15 - 41 U/L Final  . ALT 05/11/2015 23  14 - 54 U/L Final  . Alkaline Phosphatase 05/11/2015 113  38 - 126 U/L Final  . Total Bilirubin 05/11/2015 0.8  0.3 - 1.2 mg/dL Final  . GFR calc non Af Amer 05/11/2015 38* >60 mL/min Final  . GFR calc Af Amer 05/11/2015 44* >60 mL/min Final   Comment: (NOTE) The eGFR has been calculated using the CKD EPI equation. This calculation has not been validated in all clinical situations. eGFR's persistently <60 mL/min signify possible Chronic Kidney Disease.   . Anion gap 05/11/2015 6  5 - 15 Final  . WBC 05/11/2015 10.1  4.0 - 10.5 K/uL Final  . RBC 05/11/2015 3.93  3.87 - 5.11 MIL/uL Final  . Hemoglobin 05/11/2015 11.0* 12.0 - 15.0 g/dL Final  . HCT 05/11/2015 34.4* 36.0 - 46.0 % Final  . MCV 05/11/2015 87.5  78.0 - 100.0 fL Final  . MCH 05/11/2015 28.0  26.0 - 34.0 pg Final  . MCHC 05/11/2015 32.0  30.0 - 36.0 g/dL Final  . RDW 05/11/2015 14.2  11.5 - 15.5 % Final  . Platelets 05/11/2015 255  150 - 400 K/uL Final  . Glucose-Capillary 05/11/2015 28* 65 - 99 mg/dL Final  . Comment 1 05/11/2015 Notify RN   Final  . Glucose-Capillary 05/11/2015 27* 65 - 99 mg/dL Final  . Comment 1 05/11/2015 Notify RN   Final  . Glucose-Capillary 05/11/2015 59* 65 - 99 mg/dL Final  . Magnesium 05/12/2015 1.2* 1.7 - 2.4 mg/dL Final  . Glucose-Capillary 05/11/2015 124* 65 - 99 mg/dL Final  . Sodium 05/12/2015 137  135 - 145 mmol/L Final  . Potassium 05/12/2015 4.8  3.5 - 5.1 mmol/L Final   Comment: DELTA CHECK NOTED REPEATED TO VERIFY   . Chloride 05/12/2015 110  101 - 111 mmol/L Final  . CO2 05/12/2015 23  22 - 32 mmol/L Final  . Glucose, Bld 05/12/2015 27* 65 - 99 mg/dL Final   Comment: REPEATED TO VERIFY CRITICAL RESULT CALLED TO, READ BACK BY AND VERIFIED WITH: HEARN,C. RN AT 2956 05/12/15 MULLINS,T   . BUN 05/12/2015 15  6 - 20  mg/dL Final  . Creatinine, Ser 05/12/2015 1.15* 0.44 - 1.00 mg/dL Final  . Calcium 05/12/2015 8.2* 8.9 - 10.3 mg/dL Final  . GFR calc non Af Amer 05/12/2015 47* >60 mL/min Final  . GFR calc Af Amer 05/12/2015 55* >60 mL/min Final   Comment: (NOTE) The eGFR has been calculated using the CKD EPI equation. This calculation has not been validated in all clinical situations. eGFR's persistently <60 mL/min signify possible Chronic Kidney Disease.   . Anion gap 05/12/2015 4* 5 - 15 Final  . Glucose-Capillary 05/11/2015 99  65 - 99 mg/dL Final  . Comment 1 05/11/2015 Notify RN   Final  . Comment 2 05/11/2015  Document in Chart   Final  . Glucose-Capillary 05/11/2015 116* 65 - 99 mg/dL Final  . Glucose-Capillary 05/12/2015 25* 65 - 99 mg/dL Final  . Comment 1 05/12/2015 Notify RN   Final  . Comment 2 05/12/2015 Document in Chart   Final  . Glucose-Capillary 05/12/2015 106* 65 - 99 mg/dL Final  . Comment 1 05/12/2015 Notify RN   Final  . Glucose-Capillary 05/12/2015 91  65 - 99 mg/dL Final    Dg Shoulder Right  06/02/2015  CLINICAL DATA:  Fall at home this morning. Severe right shoulder pain. EXAM: RIGHT SHOULDER - 2+ VIEW COMPARISON:  04/30/2011 FINDINGS: Impacted right humeral neck fracture is seen. No evidence of dislocation. Generalized osteopenia noted. IMPRESSION: Impacted right knee humeral neck fracture.  Osteopenia. Electronically Signed   By: Earle Gell M.D.   On: 06/02/2015 11:19     Assessment/Plan   ICD-9-CM ICD-10-CM   1. Humeral head fracture, right, with routine healing, subsequent encounter V54.11 S42.291D   2. Essential hypertension 401.9 I10   3. Anemia, unspecified anemia type 285.9 D64.9    s/p 2 units PRBCs  4. Alcoholic cirrhosis of liver without ascites (HCC) 571.2 K70.30   5. Chronic diastolic congestive heart failure (HCC) 428.32 I50.32    428.0    6. Chronic obstructive pulmonary disease, unspecified COPD type ([place]) 496 J44.9   7. Idiopathic chronic gout of  multiple sites without tophus 274.02 [id number]   8. Chronic renal disease, stage III 585.3 N18.3   9. Hyperlipidemia 272.4 E78.5   10. PVD, LEIA/LIIA and RCIA pta 7/09- ABIs 0.68 and 0.69 Feb 2014 443.9 I73.9   11. Malignant neoplasm of colon, unspecified part of colon ([place]) 153.9 C18.9   12. Controlled type 2 diabetes mellitus with complication, with long-term current use of insulin (HCC) 250.90 E11.8    V58.67 Z79.4     Cont current meds as ordered  Cont [place] O2 qhs; use prn during the day to keep O2 sats>89%  PT/OT/STas ordered  Follow CBC w diff due to anemia  F/u with GI as scheduled  GOAL: short term rehab and d/c home when medically appropriate. Communicated with pt and nursing.  Will follow  Lauri Till S. Perlie Gold  [facility] [address] [place], [place] [address] [phone] Cell (Monday-Friday 8 AM - 5 PM) [phone] After 5 PM and follow prompts

## 2015-06-19 DIAGNOSIS — M25511 Pain in right shoulder: Secondary | ICD-10-CM | POA: Diagnosis not present

## 2015-06-19 DIAGNOSIS — S42294A Other nondisplaced fracture of upper end of right humerus, initial encounter for closed fracture: Secondary | ICD-10-CM | POA: Diagnosis not present

## 2015-06-30 DIAGNOSIS — K7010 Alcoholic hepatitis without ascites: Secondary | ICD-10-CM | POA: Diagnosis not present

## 2015-06-30 DIAGNOSIS — M545 Low back pain: Secondary | ICD-10-CM | POA: Diagnosis not present

## 2015-06-30 DIAGNOSIS — K7031 Alcoholic cirrhosis of liver with ascites: Secondary | ICD-10-CM | POA: Diagnosis not present

## 2015-06-30 DIAGNOSIS — E1121 Type 2 diabetes mellitus with diabetic nephropathy: Secondary | ICD-10-CM | POA: Diagnosis not present

## 2015-06-30 DIAGNOSIS — J449 Chronic obstructive pulmonary disease, unspecified: Secondary | ICD-10-CM | POA: Diagnosis not present

## 2015-06-30 DIAGNOSIS — I10 Essential (primary) hypertension: Secondary | ICD-10-CM | POA: Diagnosis not present

## 2015-06-30 DIAGNOSIS — S42211B Unspecified displaced fracture of surgical neck of right humerus, initial encounter for open fracture: Secondary | ICD-10-CM | POA: Diagnosis not present

## 2015-06-30 DIAGNOSIS — J45998 Other asthma: Secondary | ICD-10-CM | POA: Diagnosis not present

## 2015-06-30 DIAGNOSIS — J410 Simple chronic bronchitis: Secondary | ICD-10-CM | POA: Diagnosis not present

## 2015-06-30 DIAGNOSIS — K7460 Unspecified cirrhosis of liver: Secondary | ICD-10-CM | POA: Diagnosis not present

## 2015-06-30 DIAGNOSIS — K922 Gastrointestinal hemorrhage, unspecified: Secondary | ICD-10-CM | POA: Diagnosis not present

## 2015-06-30 DIAGNOSIS — I739 Peripheral vascular disease, unspecified: Secondary | ICD-10-CM | POA: Diagnosis not present

## 2015-06-30 DIAGNOSIS — Z8701 Personal history of pneumonia (recurrent): Secondary | ICD-10-CM | POA: Diagnosis not present

## 2015-06-30 DIAGNOSIS — S42291D Other displaced fracture of upper end of right humerus, subsequent encounter for fracture with routine healing: Secondary | ICD-10-CM | POA: Diagnosis not present

## 2015-06-30 DIAGNOSIS — S42301A Unspecified fracture of shaft of humerus, right arm, initial encounter for closed fracture: Secondary | ICD-10-CM | POA: Diagnosis not present

## 2015-06-30 DIAGNOSIS — F419 Anxiety disorder, unspecified: Secondary | ICD-10-CM | POA: Diagnosis not present

## 2015-06-30 DIAGNOSIS — M0899 Juvenile arthritis, unspecified, multiple sites: Secondary | ICD-10-CM | POA: Diagnosis not present

## 2015-06-30 DIAGNOSIS — C189 Malignant neoplasm of colon, unspecified: Secondary | ICD-10-CM | POA: Diagnosis not present

## 2015-06-30 DIAGNOSIS — K210 Gastro-esophageal reflux disease with esophagitis: Secondary | ICD-10-CM | POA: Diagnosis not present

## 2015-06-30 DIAGNOSIS — H42 Glaucoma in diseases classified elsewhere: Secondary | ICD-10-CM | POA: Diagnosis not present

## 2015-06-30 DIAGNOSIS — S42294D Other nondisplaced fracture of upper end of right humerus, subsequent encounter for fracture with routine healing: Secondary | ICD-10-CM | POA: Diagnosis not present

## 2015-06-30 DIAGNOSIS — E785 Hyperlipidemia, unspecified: Secondary | ICD-10-CM | POA: Diagnosis not present

## 2015-06-30 DIAGNOSIS — L259 Unspecified contact dermatitis, unspecified cause: Secondary | ICD-10-CM | POA: Diagnosis not present

## 2015-06-30 DIAGNOSIS — M1A9XX0 Chronic gout, unspecified, without tophus (tophi): Secondary | ICD-10-CM | POA: Diagnosis not present

## 2015-06-30 DIAGNOSIS — I5032 Chronic diastolic (congestive) heart failure: Secondary | ICD-10-CM | POA: Diagnosis not present

## 2015-06-30 DIAGNOSIS — F339 Major depressive disorder, recurrent, unspecified: Secondary | ICD-10-CM | POA: Diagnosis not present

## 2015-06-30 DIAGNOSIS — R601 Generalized edema: Secondary | ICD-10-CM | POA: Diagnosis not present

## 2015-06-30 DIAGNOSIS — Z86718 Personal history of other venous thrombosis and embolism: Secondary | ICD-10-CM | POA: Diagnosis not present

## 2015-06-30 DIAGNOSIS — E109 Type 1 diabetes mellitus without complications: Secondary | ICD-10-CM | POA: Diagnosis not present

## 2015-06-30 DIAGNOSIS — S42211D Unspecified displaced fracture of surgical neck of right humerus, subsequent encounter for fracture with routine healing: Secondary | ICD-10-CM | POA: Diagnosis not present

## 2015-06-30 DIAGNOSIS — K7030 Alcoholic cirrhosis of liver without ascites: Secondary | ICD-10-CM | POA: Diagnosis not present

## 2015-06-30 DIAGNOSIS — Z86010 Personal history of colonic polyps: Secondary | ICD-10-CM | POA: Diagnosis not present

## 2015-07-06 DIAGNOSIS — K7460 Unspecified cirrhosis of liver: Secondary | ICD-10-CM | POA: Diagnosis not present

## 2015-07-06 DIAGNOSIS — S42301A Unspecified fracture of shaft of humerus, right arm, initial encounter for closed fracture: Secondary | ICD-10-CM | POA: Diagnosis not present

## 2015-07-06 DIAGNOSIS — E1121 Type 2 diabetes mellitus with diabetic nephropathy: Secondary | ICD-10-CM | POA: Diagnosis not present

## 2015-07-06 DIAGNOSIS — M1A9XX0 Chronic gout, unspecified, without tophus (tophi): Secondary | ICD-10-CM | POA: Diagnosis not present

## 2015-07-06 DIAGNOSIS — J449 Chronic obstructive pulmonary disease, unspecified: Secondary | ICD-10-CM | POA: Diagnosis not present

## 2015-07-08 ENCOUNTER — Non-Acute Institutional Stay (SKILLED_NURSING_FACILITY): Payer: Medicare Other | Admitting: Adult Health

## 2015-07-08 DIAGNOSIS — S42291D Other displaced fracture of upper end of right humerus, subsequent encounter for fracture with routine healing: Secondary | ICD-10-CM | POA: Diagnosis not present

## 2015-07-08 DIAGNOSIS — K7030 Alcoholic cirrhosis of liver without ascites: Secondary | ICD-10-CM | POA: Diagnosis not present

## 2015-07-08 DIAGNOSIS — J410 Simple chronic bronchitis: Secondary | ICD-10-CM | POA: Diagnosis not present

## 2015-07-08 DIAGNOSIS — Z794 Long term (current) use of insulin: Secondary | ICD-10-CM | POA: Diagnosis not present

## 2015-07-08 DIAGNOSIS — I10 Essential (primary) hypertension: Secondary | ICD-10-CM

## 2015-07-08 DIAGNOSIS — I739 Peripheral vascular disease, unspecified: Secondary | ICD-10-CM | POA: Diagnosis not present

## 2015-07-08 DIAGNOSIS — E785 Hyperlipidemia, unspecified: Secondary | ICD-10-CM

## 2015-07-08 DIAGNOSIS — M1A09X0 Idiopathic chronic gout, multiple sites, without tophus (tophi): Secondary | ICD-10-CM

## 2015-07-08 DIAGNOSIS — K279 Peptic ulcer, site unspecified, unspecified as acute or chronic, without hemorrhage or perforation: Secondary | ICD-10-CM

## 2015-07-08 DIAGNOSIS — E118 Type 2 diabetes mellitus with unspecified complications: Secondary | ICD-10-CM

## 2015-07-08 DIAGNOSIS — I5032 Chronic diastolic (congestive) heart failure: Secondary | ICD-10-CM

## 2015-08-03 ENCOUNTER — Non-Acute Institutional Stay (SKILLED_NURSING_FACILITY): Payer: Medicare Other | Admitting: Adult Health

## 2015-08-03 DIAGNOSIS — J449 Chronic obstructive pulmonary disease, unspecified: Secondary | ICD-10-CM | POA: Diagnosis not present

## 2015-08-03 DIAGNOSIS — S42291D Other displaced fracture of upper end of right humerus, subsequent encounter for fracture with routine healing: Secondary | ICD-10-CM

## 2015-08-03 DIAGNOSIS — J410 Simple chronic bronchitis: Secondary | ICD-10-CM

## 2015-08-03 DIAGNOSIS — K7030 Alcoholic cirrhosis of liver without ascites: Secondary | ICD-10-CM | POA: Diagnosis not present

## 2015-08-03 DIAGNOSIS — K7460 Unspecified cirrhosis of liver: Secondary | ICD-10-CM | POA: Diagnosis not present

## 2015-08-03 DIAGNOSIS — E1121 Type 2 diabetes mellitus with diabetic nephropathy: Secondary | ICD-10-CM | POA: Diagnosis not present

## 2015-08-03 DIAGNOSIS — L259 Unspecified contact dermatitis, unspecified cause: Secondary | ICD-10-CM | POA: Diagnosis not present

## 2015-08-03 DIAGNOSIS — S42301A Unspecified fracture of shaft of humerus, right arm, initial encounter for closed fracture: Secondary | ICD-10-CM | POA: Diagnosis not present

## 2015-08-03 DIAGNOSIS — I5032 Chronic diastolic (congestive) heart failure: Secondary | ICD-10-CM | POA: Diagnosis not present

## 2015-08-07 DIAGNOSIS — S42294D Other nondisplaced fracture of upper end of right humerus, subsequent encounter for fracture with routine healing: Secondary | ICD-10-CM | POA: Diagnosis not present

## 2015-08-10 ENCOUNTER — Emergency Department (HOSPITAL_COMMUNITY): Payer: Medicare Other

## 2015-08-10 ENCOUNTER — Encounter (HOSPITAL_COMMUNITY): Payer: Self-pay | Admitting: Emergency Medicine

## 2015-08-10 ENCOUNTER — Inpatient Hospital Stay (HOSPITAL_COMMUNITY)
Admission: EM | Admit: 2015-08-10 | Discharge: 2015-08-18 | DRG: 871 | Disposition: A | Discharge status: Home Health | Payer: Medicare Other | Attending: Internal Medicine | Admitting: Internal Medicine

## 2015-08-10 DIAGNOSIS — S0511XA Contusion of eyeball and orbital tissues, right eye, initial encounter: Secondary | ICD-10-CM | POA: Diagnosis not present

## 2015-08-10 DIAGNOSIS — E162 Hypoglycemia, unspecified: Secondary | ICD-10-CM | POA: Diagnosis not present

## 2015-08-10 DIAGNOSIS — G9340 Encephalopathy, unspecified: Secondary | ICD-10-CM | POA: Diagnosis present

## 2015-08-10 DIAGNOSIS — A419 Sepsis, unspecified organism: Principal | ICD-10-CM

## 2015-08-10 DIAGNOSIS — Z9841 Cataract extraction status, right eye: Secondary | ICD-10-CM | POA: Diagnosis not present

## 2015-08-10 DIAGNOSIS — I739 Peripheral vascular disease, unspecified: Secondary | ICD-10-CM

## 2015-08-10 DIAGNOSIS — R29818 Other symptoms and signs involving the nervous system: Secondary | ICD-10-CM | POA: Diagnosis not present

## 2015-08-10 DIAGNOSIS — J9621 Acute and chronic respiratory failure with hypoxia: Secondary | ICD-10-CM | POA: Diagnosis not present

## 2015-08-10 DIAGNOSIS — S0990XA Unspecified injury of head, initial encounter: Secondary | ICD-10-CM | POA: Diagnosis not present

## 2015-08-10 DIAGNOSIS — N3000 Acute cystitis without hematuria: Secondary | ICD-10-CM | POA: Diagnosis not present

## 2015-08-10 DIAGNOSIS — Z90710 Acquired absence of both cervix and uterus: Secondary | ICD-10-CM | POA: Diagnosis not present

## 2015-08-10 DIAGNOSIS — Z7982 Long term (current) use of aspirin: Secondary | ICD-10-CM

## 2015-08-10 DIAGNOSIS — E876 Hypokalemia: Secondary | ICD-10-CM | POA: Diagnosis not present

## 2015-08-10 DIAGNOSIS — J410 Simple chronic bronchitis: Secondary | ICD-10-CM

## 2015-08-10 DIAGNOSIS — Z79899 Other long term (current) drug therapy: Secondary | ICD-10-CM | POA: Diagnosis not present

## 2015-08-10 DIAGNOSIS — Z87891 Personal history of nicotine dependence: Secondary | ICD-10-CM

## 2015-08-10 DIAGNOSIS — R52 Pain, unspecified: Secondary | ICD-10-CM | POA: Diagnosis not present

## 2015-08-10 DIAGNOSIS — I639 Cerebral infarction, unspecified: Secondary | ICD-10-CM | POA: Diagnosis not present

## 2015-08-10 DIAGNOSIS — T426X5A Adverse effect of other antiepileptic and sedative-hypnotic drugs, initial encounter: Secondary | ICD-10-CM | POA: Diagnosis not present

## 2015-08-10 DIAGNOSIS — M25532 Pain in left wrist: Secondary | ICD-10-CM | POA: Diagnosis not present

## 2015-08-10 DIAGNOSIS — E872 Acidosis: Secondary | ICD-10-CM | POA: Diagnosis present

## 2015-08-10 DIAGNOSIS — E161 Other hypoglycemia: Secondary | ICD-10-CM | POA: Diagnosis not present

## 2015-08-10 DIAGNOSIS — Y92239 Unspecified place in hospital as the place of occurrence of the external cause: Secondary | ICD-10-CM | POA: Diagnosis not present

## 2015-08-10 DIAGNOSIS — M109 Gout, unspecified: Secondary | ICD-10-CM | POA: Diagnosis present

## 2015-08-10 DIAGNOSIS — Z794 Long term (current) use of insulin: Secondary | ICD-10-CM | POA: Diagnosis not present

## 2015-08-10 DIAGNOSIS — S199XXA Unspecified injury of neck, initial encounter: Secondary | ICD-10-CM | POA: Diagnosis not present

## 2015-08-10 DIAGNOSIS — R918 Other nonspecific abnormal finding of lung field: Secondary | ICD-10-CM | POA: Diagnosis not present

## 2015-08-10 DIAGNOSIS — I5032 Chronic diastolic (congestive) heart failure: Secondary | ICD-10-CM | POA: Diagnosis present

## 2015-08-10 DIAGNOSIS — J45909 Unspecified asthma, uncomplicated: Secondary | ICD-10-CM | POA: Diagnosis present

## 2015-08-10 DIAGNOSIS — Z9981 Dependence on supplemental oxygen: Secondary | ICD-10-CM | POA: Diagnosis not present

## 2015-08-10 DIAGNOSIS — I10 Essential (primary) hypertension: Secondary | ICD-10-CM | POA: Diagnosis not present

## 2015-08-10 DIAGNOSIS — E08649 Diabetes mellitus due to underlying condition with hypoglycemia without coma: Secondary | ICD-10-CM | POA: Diagnosis not present

## 2015-08-10 DIAGNOSIS — Z888 Allergy status to other drugs, medicaments and biological substances status: Secondary | ICD-10-CM

## 2015-08-10 DIAGNOSIS — M25512 Pain in left shoulder: Secondary | ICD-10-CM | POA: Diagnosis not present

## 2015-08-10 DIAGNOSIS — W1830XA Fall on same level, unspecified, initial encounter: Secondary | ICD-10-CM | POA: Diagnosis not present

## 2015-08-10 DIAGNOSIS — R404 Transient alteration of awareness: Secondary | ICD-10-CM | POA: Diagnosis not present

## 2015-08-10 DIAGNOSIS — R112 Nausea with vomiting, unspecified: Secondary | ICD-10-CM | POA: Diagnosis not present

## 2015-08-10 DIAGNOSIS — T68XXXA Hypothermia, initial encounter: Secondary | ICD-10-CM

## 2015-08-10 DIAGNOSIS — E1151 Type 2 diabetes mellitus with diabetic peripheral angiopathy without gangrene: Secondary | ICD-10-CM | POA: Diagnosis not present

## 2015-08-10 DIAGNOSIS — Z880 Allergy status to penicillin: Secondary | ICD-10-CM | POA: Diagnosis not present

## 2015-08-10 DIAGNOSIS — R1110 Vomiting, unspecified: Secondary | ICD-10-CM | POA: Diagnosis not present

## 2015-08-10 DIAGNOSIS — M79602 Pain in left arm: Secondary | ICD-10-CM

## 2015-08-10 DIAGNOSIS — J449 Chronic obstructive pulmonary disease, unspecified: Secondary | ICD-10-CM | POA: Diagnosis present

## 2015-08-10 DIAGNOSIS — R7309 Other abnormal glucose: Secondary | ICD-10-CM | POA: Diagnosis not present

## 2015-08-10 DIAGNOSIS — M25522 Pain in left elbow: Secondary | ICD-10-CM | POA: Diagnosis not present

## 2015-08-10 DIAGNOSIS — N390 Urinary tract infection, site not specified: Secondary | ICD-10-CM | POA: Diagnosis not present

## 2015-08-10 DIAGNOSIS — S0510XA Contusion of eyeball and orbital tissues, unspecified eye, initial encounter: Secondary | ICD-10-CM | POA: Diagnosis not present

## 2015-08-10 DIAGNOSIS — J690 Pneumonitis due to inhalation of food and vomit: Secondary | ICD-10-CM | POA: Diagnosis not present

## 2015-08-10 DIAGNOSIS — F1010 Alcohol abuse, uncomplicated: Secondary | ICD-10-CM | POA: Diagnosis not present

## 2015-08-10 DIAGNOSIS — R4182 Altered mental status, unspecified: Secondary | ICD-10-CM | POA: Diagnosis not present

## 2015-08-10 DIAGNOSIS — N179 Acute kidney failure, unspecified: Secondary | ICD-10-CM | POA: Diagnosis present

## 2015-08-10 DIAGNOSIS — E11649 Type 2 diabetes mellitus with hypoglycemia without coma: Secondary | ICD-10-CM | POA: Diagnosis present

## 2015-08-10 DIAGNOSIS — K219 Gastro-esophageal reflux disease without esophagitis: Secondary | ICD-10-CM | POA: Diagnosis not present

## 2015-08-10 DIAGNOSIS — R000 Tachycardia, unspecified: Secondary | ICD-10-CM | POA: Diagnosis not present

## 2015-08-10 DIAGNOSIS — J441 Chronic obstructive pulmonary disease with (acute) exacerbation: Secondary | ICD-10-CM | POA: Diagnosis present

## 2015-08-10 DIAGNOSIS — J9811 Atelectasis: Secondary | ICD-10-CM | POA: Diagnosis not present

## 2015-08-10 LAB — URINALYSIS, ROUTINE W REFLEX MICROSCOPIC
Bilirubin Urine: NEGATIVE
GLUCOSE, UA: NEGATIVE mg/dL
KETONES UR: NEGATIVE mg/dL
Nitrite: NEGATIVE
PROTEIN: NEGATIVE mg/dL
Specific Gravity, Urine: 1.008 (ref 1.005–1.030)
pH: 6 (ref 5.0–8.0)

## 2015-08-10 LAB — CBC WITH DIFFERENTIAL/PLATELET
Basophils Absolute: 0 10*3/uL (ref 0.0–0.1)
Basophils Relative: 0 %
EOS ABS: 0.1 10*3/uL (ref 0.0–0.7)
EOS PCT: 0 %
HCT: 35.7 % — ABNORMAL LOW (ref 36.0–46.0)
Hemoglobin: 11.3 g/dL — ABNORMAL LOW (ref 12.0–15.0)
LYMPHS ABS: 4.2 10*3/uL — AB (ref 0.7–4.0)
Lymphocytes Relative: 28 %
MCH: 27.4 pg (ref 26.0–34.0)
MCHC: 31.7 g/dL (ref 30.0–36.0)
MCV: 86.7 fL (ref 78.0–100.0)
MONO ABS: 0.8 10*3/uL (ref 0.1–1.0)
Monocytes Relative: 5 %
Neutro Abs: 9.6 10*3/uL — ABNORMAL HIGH (ref 1.7–7.7)
Neutrophils Relative %: 67 %
PLATELETS: 331 10*3/uL (ref 150–400)
RBC: 4.12 MIL/uL (ref 3.87–5.11)
RDW: 15.1 % (ref 11.5–15.5)
WBC: 14.6 10*3/uL — AB (ref 4.0–10.5)

## 2015-08-10 LAB — URINE MICROSCOPIC-ADD ON: RBC / HPF: NONE SEEN RBC/hpf (ref 0–5)

## 2015-08-10 LAB — APTT: APTT: 34 s (ref 24–37)

## 2015-08-10 LAB — COMPREHENSIVE METABOLIC PANEL
ALK PHOS: 182 U/L — AB (ref 38–126)
ALT: 27 U/L (ref 14–54)
ANION GAP: 7 (ref 5–15)
AST: 41 U/L (ref 15–41)
Albumin: 2.1 g/dL — ABNORMAL LOW (ref 3.5–5.0)
BILIRUBIN TOTAL: 0.5 mg/dL (ref 0.3–1.2)
BUN: 12 mg/dL (ref 6–20)
CALCIUM: 8.8 mg/dL — AB (ref 8.9–10.3)
CO2: 25 mmol/L (ref 22–32)
CREATININE: 1.32 mg/dL — AB (ref 0.44–1.00)
Chloride: 109 mmol/L (ref 101–111)
GFR, EST AFRICAN AMERICAN: 46 mL/min — AB (ref >60)
GFR, EST NON AFRICAN AMERICAN: 40 mL/min — AB (ref >60)
Glucose, Bld: 89 mg/dL (ref 65–99)
Potassium: 3.9 mmol/L (ref 3.5–5.1)
Sodium: 141 mmol/L (ref 135–145)
TOTAL PROTEIN: 6 g/dL — AB (ref 6.5–8.1)

## 2015-08-10 LAB — GLUCOSE, CAPILLARY
GLUCOSE-CAPILLARY: 123 mg/dL — AB (ref 65–99)
Glucose-Capillary: 60 mg/dL — ABNORMAL LOW (ref 65–99)

## 2015-08-10 LAB — CBG MONITORING, ED
GLUCOSE-CAPILLARY: 63 mg/dL — AB (ref 65–99)
GLUCOSE-CAPILLARY: 96 mg/dL (ref 65–99)
GLUCOSE-CAPILLARY: 102 mg/dL — AB (ref 65–99)

## 2015-08-10 LAB — PROCALCITONIN

## 2015-08-10 LAB — LACTIC ACID, PLASMA
LACTIC ACID, VENOUS: 2.4 mmol/L — AB (ref 0.5–2.0)
Lactic Acid, Venous: 1.5 mmol/L (ref 0.5–2.0)

## 2015-08-10 LAB — PROTIME-INR
INR: 1.15 (ref 0.00–1.49)
Prothrombin Time: 14.9 seconds (ref 11.6–15.2)

## 2015-08-10 MED ORDER — HEPARIN SODIUM (PORCINE) 5000 UNIT/ML IJ SOLN
5000.0000 [IU] | Freq: Three times a day (TID) | INTRAMUSCULAR | Status: DC
  Administered 2015-08-10 – 2015-08-18 (×24): 5000 [IU] via SUBCUTANEOUS
  Filled 2015-08-10 (×22): qty 1

## 2015-08-10 MED ORDER — SODIUM CHLORIDE 0.9 % IJ SOLN
3.0000 mL | Freq: Two times a day (BID) | INTRAMUSCULAR | Status: DC
Start: 2015-08-10 — End: 2015-08-18
  Administered 2015-08-10 – 2015-08-16 (×10): 3 mL via INTRAVENOUS

## 2015-08-10 MED ORDER — DEXTROSE 50 % IV SOLN: 25.0000 mL | Freq: Once | INTRAVENOUS | Status: DC

## 2015-08-10 MED ORDER — ASPIRIN EC 81 MG PO TBEC
81.0000 mg | DELAYED_RELEASE_TABLET | Freq: Every day | ORAL | Status: DC
  Administered 2015-08-10 – 2015-08-13 (×4): 81 mg via ORAL
  Filled 2015-08-10 (×5): qty 1

## 2015-08-10 MED ORDER — MONTELUKAST SODIUM 10 MG PO TABS
10.0000 mg | ORAL_TABLET | Freq: Every day | ORAL | Status: DC
  Administered 2015-08-10 – 2015-08-13 (×4): 10 mg via ORAL
  Filled 2015-08-10 (×5): qty 1

## 2015-08-10 MED ORDER — SODIUM CHLORIDE 0.9 % IV BOLUS (SEPSIS)
500.0000 mL | Freq: Once | INTRAVENOUS | Status: AC
  Administered 2015-08-10: 500 mL via INTRAVENOUS

## 2015-08-10 MED ORDER — TRAMADOL HCL 50 MG PO TABS
50.0000 mg | ORAL_TABLET | Freq: Four times a day (QID) | ORAL | Status: DC | PRN
  Administered 2015-08-11 (×3): 50 mg via ORAL
  Filled 2015-08-10 (×3): qty 1

## 2015-08-10 MED ORDER — PANTOPRAZOLE SODIUM 40 MG PO TBEC
80.0000 mg | DELAYED_RELEASE_TABLET | Freq: Every day | ORAL | Status: DC
  Administered 2015-08-10 – 2015-08-13 (×4): 80 mg via ORAL
  Filled 2015-08-10 (×5): qty 2

## 2015-08-10 MED ORDER — ALLOPURINOL 100 MG PO TABS
100.0000 mg | ORAL_TABLET | Freq: Two times a day (BID) | ORAL | Status: DC
  Administered 2015-08-10 – 2015-08-13 (×7): 100 mg via ORAL
  Filled 2015-08-10 (×8): qty 1

## 2015-08-10 MED ORDER — ONDANSETRON HCL 4 MG PO TABS
4.0000 mg | ORAL_TABLET | Freq: Four times a day (QID) | ORAL | Status: DC | PRN
  Administered 2015-08-13 (×2): 4 mg via ORAL
  Filled 2015-08-10 (×3): qty 1

## 2015-08-10 MED ORDER — DEXTROSE 5 % IV SOLN
1.0000 g | Freq: Two times a day (BID) | INTRAVENOUS | Status: DC
  Administered 2015-08-10 – 2015-08-11 (×2): 1 g via INTRAVENOUS
  Filled 2015-08-10 (×3): qty 1

## 2015-08-10 MED ORDER — SODIUM CHLORIDE 0.9 % IV SOLN
INTRAVENOUS | Status: DC
Start: 2015-08-10 — End: 2015-08-12
  Administered 2015-08-10 – 2015-08-11 (×4): via INTRAVENOUS

## 2015-08-10 MED ORDER — LEVOFLOXACIN IN D5W 750 MG/150ML IV SOLN: 750.0000 mg | Freq: Once | INTRAVENOUS | Status: DC

## 2015-08-10 MED ORDER — LEVOFLOXACIN IN D5W 750 MG/150ML IV SOLN
750.0000 mg | INTRAVENOUS | Status: DC
  Administered 2015-08-11: 750 mg via INTRAVENOUS
  Filled 2015-08-10: qty 150

## 2015-08-10 MED ORDER — INSULIN ASPART 100 UNIT/ML ~~LOC~~ SOLN
0.0000 [IU] | Freq: Every day | SUBCUTANEOUS | Status: DC
  Administered 2015-08-12: 2 [IU] via SUBCUTANEOUS

## 2015-08-10 MED ORDER — SODIUM CHLORIDE 0.9 % IV BOLUS (SEPSIS)
1000.0000 mL | Freq: Once | INTRAVENOUS | Status: AC
  Administered 2015-08-10: 1000 mL via INTRAVENOUS

## 2015-08-10 MED ORDER — CILOSTAZOL 100 MG PO TABS
50.0000 mg | ORAL_TABLET | Freq: Two times a day (BID) | ORAL | Status: DC
  Administered 2015-08-10 – 2015-08-14 (×8): 50 mg via ORAL
  Filled 2015-08-10 (×12): qty 1

## 2015-08-10 MED ORDER — MAGNESIUM OXIDE 250 MG PO TABS: 250.0000 mg | ORAL_TABLET | Freq: Every day | ORAL | Status: DC

## 2015-08-10 MED ORDER — DEXTROSE 5 % IV SOLN: 2.0000 g | Freq: Once | INTRAVENOUS | Status: DC

## 2015-08-10 MED ORDER — CIPROFLOXACIN IN D5W 400 MG/200ML IV SOLN
400.0000 mg | Freq: Once | INTRAVENOUS | Status: AC
  Administered 2015-08-10: 400 mg via INTRAVENOUS
  Filled 2015-08-10: qty 200

## 2015-08-10 MED ORDER — ONDANSETRON HCL 4 MG/2ML IJ SOLN
4.0000 mg | Freq: Four times a day (QID) | INTRAMUSCULAR | Status: DC | PRN
  Administered 2015-08-11: 4 mg via INTRAVENOUS
  Filled 2015-08-10: qty 2

## 2015-08-10 MED ORDER — MAGNESIUM OXIDE 400 (241.3 MG) MG PO TABS
200.0000 mg | ORAL_TABLET | Freq: Every day | ORAL | Status: DC
  Administered 2015-08-10 – 2015-08-13 (×4): 200 mg via ORAL
  Filled 2015-08-10 (×4): qty 1

## 2015-08-10 MED ORDER — LEVOFLOXACIN IN D5W 750 MG/150ML IV SOLN: 750.0000 mg | INTRAVENOUS | Status: DC

## 2015-08-10 MED ORDER — INSULIN ASPART 100 UNIT/ML ~~LOC~~ SOLN
0.0000 [IU] | Freq: Three times a day (TID) | SUBCUTANEOUS | Status: DC
  Administered 2015-08-11: 1 [IU] via SUBCUTANEOUS
  Administered 2015-08-11: 3 [IU] via SUBCUTANEOUS
  Administered 2015-08-12 – 2015-08-14 (×2): 2 [IU] via SUBCUTANEOUS

## 2015-08-10 MED ORDER — ALBUTEROL SULFATE (2.5 MG/3ML) 0.083% IN NEBU: 2.5000 mg | INHALATION_SOLUTION | Freq: Four times a day (QID) | RESPIRATORY_TRACT | Status: DC | PRN

## 2015-08-10 NOTE — ED Notes (Signed)
Patient sent for scan by another staff member.

## 2015-08-10 NOTE — ED Notes (Signed)
MD states hold off on Dextrose recheck CBG in 10 mins after more OJ given ED resident started 20 R [facility]

## 2015-08-10 NOTE — ED Notes (Signed)
Md aware of rectal temp. Patient remains on bear hugger.

## 2015-08-10 NOTE — H&P (Addendum)
[facility] History and Physical  [name] LNL:[id number] DOB: [birth year] DOA: 08/10/2015  Referring physician: Dr Kathi Simpers - [facility] PCP: Philis Fendt, MD   Chief Complaint: AMS  HPI: [name] is a 69 y.o. female    Level V caveat: Patient presenting an altered mental state.  History provided by patient, ED physician, and EMS who found patient and brought to the ED.  Patient without any recollection of events after going to bed last night. Denies any symptoms at this time including fevers, chest pain, shortness breath, palpitations, abdominal pain, dysuria, frequency, back pain, rash. States that she's been in normal state of health over the last several weeks. Patient was found down in the hallway of her home this morning by her caretaker. Last seen normal at 29 and 11:00 night before in her home. Denies any loss of bowel or bladder function or tongue biting. Patient was admitted recently for humerus fracture. Denies any head trauma during fall.   Upon initial evaluation by EMS patient was noted to have sugars in the 60s and was given oral glucose. Patient also noted to have a right orbital ecchymoses. In the ED patient was noted to be hypothermic at 93.5 rectally was placed on a bear hugger. Glucose was noted to be at 96.   Review of Systems:   Patient unable to provide further review systems outside of what is mentioned in the history of present illness.  Past Medical History  Diagnosis Date  . Alcoholic cirrhosis ([place])   . Reflux esophagitis   . Gastroesophageal reflux   . Colon polyps   . CHF (congestive heart failure) ([place]) 10/13    grade 1 diastolic dysfunction, Nl LVF  . Hypertension   . History of GI diverticular bleed   . Pneumonia     history of  . Asthma   . Anemia   . Blood transfusion few years ago  . Villous adenoma of colon 05/10/11  . PVD (peripheral vascular disease) ([place]) 2009    bilat iliac stenting  . Chronic obstructive pulmonary  disease (COPD) ([place])   . Arthritis   . Glaucoma     left  . Gout   . Acute venous embolism and thrombosis of deep vessels of proximal lower extremity ([place])   . Back pain   . Leg swelling   . Diabetes mellitus     insulin dep   . Hepatitis     alcoholic hepatitis  . Depression   . Anxiety   . On home oxygen therapy oxygen 2 liter per minute prn per [place]  . Cellulitis     bilateral lower extremities  . Chronic diastolic congestive heart failure [facility])    Past Surgical History  Procedure Laterality Date  . Cataract extraction Right yrs ago  . Colonoscopy    . Laparoscopic assissted total colectomy w/ j-pouch  04/22/11  . Ventral hernia repair  06/13/2012    Procedure: LAPAROSCOPIC VENTRAL HERNIA;  Surgeon: Adin Hector, MD;  Location: [facility];  Service: General;  Laterality: N/A;  Laparoscopic Assisted Ventral Hernia with Mesh  . Abdominal hysterectomy  yrs ago  . Esophagogastroduodenoscopy (egd) with propofol N/A 08/06/2013    Procedure: ESOPHAGOGASTRODUODENOSCOPY (EGD) WITH PROPOFOL;  Surgeon: Lear Ng, MD;  Location: [facility];  Service: Endoscopy;  Laterality: N/A;  . Colonoscopy with propofol N/A 08/06/2013    Procedure: COLONOSCOPY WITH PROPOFOL;  Surgeon: Lear Ng, MD;  Location: [facility];  Service: Endoscopy;  Laterality: N/A;  Social History:  reports that she quit smoking about 9 years ago. Her smoking use included Cigarettes. She has a 15 pack-year smoking history. She has never used smokeless tobacco. She reports that she does not drink alcohol or use illicit drugs.  Allergies  Allergen Reactions  . Penicillins Hives    Has patient had a PCN reaction causing immediate rash, facial/tongue/throat swelling, SOB or lightheadedness with hypotension: No Has patient had a PCN reaction causing severe rash involving mucus membranes or skin necrosis: No Has patient had a PCN reaction that required hospitalization No Has patient had a PCN reaction  occurring within the last 10 years: No If all of the above answers are "NO", then may proceed with Cephalosporin use.   . Lipitor [Atorvastatin] Swelling    Family History  Problem Relation Age of Onset  . Cancer Brother     heent ca  . Cancer Sister     liver ca     Prior to Admission medications   Medication Sig Start Date End Date Taking? Authorizing Provider  albuterol (PROVENTIL HFA;VENTOLIN HFA) 108 (90 BASE) MCG/ACT inhaler Inhale 2 puffs into the lungs every 6 (six) hours as needed. For shortness of breath    Historical Provider, MD  albuterol (PROVENTIL) (2.5 MG/3ML) 0.083% nebulizer solution Take 2.5 mg by nebulization every 6 (six) hours as needed for wheezing or shortness of breath.    Historical Provider, MD  allopurinol (ZYLOPRIM) 100 MG tablet Take 100 mg by mouth 2 (two) times daily.    Historical Provider, MD  aspirin 81 MG EC tablet Take 81 mg by mouth daily.      Historical Provider, MD  carvedilol (COREG) 6.25 MG tablet Take 1 tablet (6.25 mg total) by mouth 2 (two) times daily with a meal. 09/05/14   [name], MD  cilostazol (PLETAL) 100 MG tablet Take 50 mg by mouth 2 (two) times daily before a meal.     Historical Provider, MD  cloNIDine (CATAPRES) 0.1 MG tablet Take 1 tablet (0.1 mg total) by mouth 3 (three) times daily. 09/05/14   [name], MD  ergocalciferol (VITAMIN D2) 50000 UNITS capsule Take 50,000 Units by mouth every Monday. On Monday    Historical Provider, MD  fluticasone (FLONASE) 50 MCG/ACT nasal spray Place 2 sprays into the nose daily as needed for allergies.     Historical Provider, MD  glucose monitoring kit (FREESTYLE) monitoring kit 1 each by Does not apply route 4 (four) times daily - after meals and at bedtime. 1 month Diabetic Testing Supplies for QAC-QHS accuchecks.Any brand OK 07/30/14   Thurnell Lose, MD  Magnesium Oxide 250 MG TABS Take 1 tablet (250 mg total) by mouth daily. 05/12/15   Oswald Hillock, MD  metoprolol (LOPRESSOR)  50 MG tablet Take 25 mg by mouth 2 (two) times daily. 03/04/15   Historical Provider, MD  montelukast (SINGULAIR) 10 MG tablet Take 10 mg by mouth at bedtime.     Historical Provider, MD  omeprazole (PRILOSEC) 40 MG capsule Take 40 mg by mouth daily.     Historical Provider, MD  OXYGEN-HELIUM IN Inhale 2 L into the lungs at bedtime. 2 liters nightly    Historical Provider, MD  simvastatin (ZOCOR) 10 MG tablet Take 10 mg by mouth daily.  11/30/14   Historical Provider, MD  traMADol (ULTRAM) 50 MG tablet Take 1 tablet (50 mg total) by mouth every 6 (six) hours as needed. 06/02/15   Carmin Muskrat, MD  Physical Exam: Filed Vitals:   08/10/15 1530 08/10/15 1550 08/10/15 1615 08/10/15 1707  BP: 104/65 109/74 119/72 127/77  Pulse: 121 129  121  Temp:    98.2 F (36.8 C)  TempSrc:    Oral  Resp: '21 16 12   ' Height:    5' (1.524 m)  Weight:    55.3 kg (121 lb 14.6 oz)  SpO2: 96% 97%  100%    Wt Readings from Last 3 Encounters:  08/10/15 55.3 kg (121 lb 14.6 oz)  06/18/15 56.337 kg (124 lb 3.2 oz)  06/08/15 56.7 kg (125 lb)    General: Ill appearing but calm.  Eyes:  PERRL, EOMI, R perioribital ecchymosis ENT: dry mm Neck:  no LAD, masses or thyromegaly Cardiovascular: RRR, II/VI systolic murmur. Trace LE edema.  Respiratory:  CTA bilaterally, no w/r/r. Normal respiratory effort. Abdomen:  soft, ntnd Skin:  No rash. Ecchymosis as above.  Musculoskeletal:  grossly normal tone BUE/BLE Psychiatric:  AOx3. Answers questions appropriately but somewhat delayed in response.  Neurologic:  CN 2-12 grossly intact, moves all extremities in coordinated fashion.          Labs on Admission:  Basic Metabolic Panel:  Recent Labs Lab 08/10/15 1148  NA 141  K 3.9  CL 109  CO2 25  GLUCOSE 89  BUN 12  CREATININE 1.32*  CALCIUM 8.8*   Liver Function Tests:  Recent Labs Lab 08/10/15 1148  AST 41  ALT 27  ALKPHOS 182*  BILITOT 0.5  PROT 6.0*  ALBUMIN 2.1*   No results for input(s):  LIPASE, AMYLASE in the last 168 hours. No results for input(s): AMMONIA in the last 168 hours. CBC:  Recent Labs Lab 08/10/15 1148  WBC 14.6*  NEUTROABS 9.6*  HGB 11.3*  HCT 35.7*  MCV 86.7  PLT 331   Cardiac Enzymes: No results for input(s): CKTOTAL, CKMB, CKMBINDEX, TROPONINI in the last 168 hours.  BNP (last 3 results) No results for input(s): BNP in the last 8760 hours.  ProBNP (last 3 results) No results for input(s): PROBNP in the last 8760 hours.   CREATININE: 1.32 mg/dL ABNORMAL (08/10/15 1148) Estimated creatinine clearance - 30.5 mL/min  CBG:  Recent Labs Lab 08/10/15 1126 08/10/15 1209 08/10/15 1527  GLUCAP 63* 96 102*    Radiological Exams on Admission: Dg Chest 2 View  08/10/2015  CLINICAL DATA:  Fall.  Hypoglycemia. EXAM: CHEST  2 VIEW COMPARISON:  06/15/2014 FINDINGS: Minimal bibasilar opacities, likely atelectasis or scarring. Heart is normal size. No effusions. No acute bony abnormality. Degenerative changes in the shoulders and thoracic spine. Multiple compression fractures in the mid thoracic spine, likely chronic. IMPRESSION: Bibasilar scarring or atelectasis. Electronically Signed   By: Rolm Baptise M.D.   On: 08/10/2015 13:22   Ct Head Wo Contrast  08/10/2015  CLINICAL DATA:  Status post fall. EXAM: CT HEAD WITHOUT CONTRAST CT CERVICAL SPINE WITHOUT CONTRAST TECHNIQUE: Multidetector CT imaging of the head and cervical spine was performed following the standard protocol without intravenous contrast. Multiplanar CT image reconstructions of the cervical spine were also generated. COMPARISON:  06/14/2014 FINDINGS: CT HEAD FINDINGS There is no evidence of mass effect, midline shift, or extra-axial fluid collections. There is no evidence of a space-occupying lesion or intracranial hemorrhage. There is no evidence of a cortical-based area of acute infarction. There is generalized cerebral atrophy. There is periventricular white matter low attenuation likely  secondary to microangiopathy. The ventricles and sulci are appropriate for the patient's age. The basal  cisterns are patent. Visualized portions of the orbits are unremarkable. There is chronic right maxillary sinus disease partially visualized. Cerebrovascular atherosclerotic calcifications are noted. The osseous structures are unremarkable. CT CERVICAL SPINE FINDINGS The alignment is anatomic. The vertebral body heights are maintained. There is no acute fracture. 4 mm of anterolisthesis of C3 on C4 secondary to severe left facet disease. The prevertebral soft tissues are normal. The intraspinal soft tissues are not fully imaged on this examination due to poor soft tissue contrast, but there is no gross soft tissue abnormality. Degenerative disc disease at C3-4, C4-5, C5-6 and C6-7. Broad-based disc osteophyte complexes at C4-5, C5-6 and C6-7. Mild broad-based disc bulge at C7-T1. Moderate left facet arthropathy at C3-4 with mild erosive changes. Mild left facet arthropathy at C4-5. Bilateral uncovertebral degenerative changes at C5-6 with left foraminal narrowing. Right uncovertebral degenerative change at C6-7 with right foraminal narrowing. There is osseous fusion of the right C4-5 posterior elements. The visualized portions of the lung apices demonstrate no focal abnormality. There is bilateral carotid artery atherosclerosis. IMPRESSION: 1. No acute intracranial pathology. 2. No acute osseous injury of the cervical spine. Electronically Signed   By: Kathreen Devoid   On: 08/10/2015 13:12   Ct Cervical Spine Wo Contrast  08/10/2015  CLINICAL DATA:  Status post fall. EXAM: CT HEAD WITHOUT CONTRAST CT CERVICAL SPINE WITHOUT CONTRAST TECHNIQUE: Multidetector CT imaging of the head and cervical spine was performed following the standard protocol without intravenous contrast. Multiplanar CT image reconstructions of the cervical spine were also generated. COMPARISON:  06/14/2014 FINDINGS: CT HEAD FINDINGS There is no  evidence of mass effect, midline shift, or extra-axial fluid collections. There is no evidence of a space-occupying lesion or intracranial hemorrhage. There is no evidence of a cortical-based area of acute infarction. There is generalized cerebral atrophy. There is periventricular white matter low attenuation likely secondary to microangiopathy. The ventricles and sulci are appropriate for the patient's age. The basal cisterns are patent. Visualized portions of the orbits are unremarkable. There is chronic right maxillary sinus disease partially visualized. Cerebrovascular atherosclerotic calcifications are noted. The osseous structures are unremarkable. CT CERVICAL SPINE FINDINGS The alignment is anatomic. The vertebral body heights are maintained. There is no acute fracture. 4 mm of anterolisthesis of C3 on C4 secondary to severe left facet disease. The prevertebral soft tissues are normal. The intraspinal soft tissues are not fully imaged on this examination due to poor soft tissue contrast, but there is no gross soft tissue abnormality. Degenerative disc disease at C3-4, C4-5, C5-6 and C6-7. Broad-based disc osteophyte complexes at C4-5, C5-6 and C6-7. Mild broad-based disc bulge at C7-T1. Moderate left facet arthropathy at C3-4 with mild erosive changes. Mild left facet arthropathy at C4-5. Bilateral uncovertebral degenerative changes at C5-6 with left foraminal narrowing. Right uncovertebral degenerative change at C6-7 with right foraminal narrowing. There is osseous fusion of the right C4-5 posterior elements. The visualized portions of the lung apices demonstrate no focal abnormality. There is bilateral carotid artery atherosclerosis. IMPRESSION: 1. No acute intracranial pathology. 2. No acute osseous injury of the cervical spine. Electronically Signed   By: Kathreen Devoid   On: 08/10/2015 13:12    Assessment/Plan Principal Problem:   Sepsis ([place]) Active Problems:   COPD (chronic obstructive pulmonary  disease) (HCC)   PVD, LEIA/LIIA and RCIA pta 7/09- ABIs 0.68 and 0.69 Feb 2014   Acute encephalopathy   Essential hypertension   Gout  69 year old female with history of GI bleeds, COPD, HTN, PVD,  glaucoma, gout, DVTs, DM, alcoholic hepatitis, depression/anxiety, CHF, presenting with sepsis  Sepsis: Suspect urologic etiology. UA grossly abnormal, Cr 1.32, WBC 14.6, hypotensive, tachycardic, Hypothermic. Encephalopathic w/ no recollection of fall/syncope.   - Stepdown - IVF - Start levaquin and Ceftaz (PCN allergy) - Narrow in am or when Cx returns.  - f/u BCX, UCX - Lactic acid - PT/OT (presenting after being found down on the floor) - CK (long lie syndrome)  HTN: - hold home Meds until pressure improves - Metop, coreg, clonidine  COPD: - continue albuterol, singulair  R orbital ecchymosis: Likely sustained from fall overnight. CT without evidence of orbital fracture or ICP.  Diastolic CHF: last Echo 87% EF and grade 1 diastolic dysfxn. No evidence of overload this this may occur given fluid resusitation - monitor - continue bblocker once pressure improves.   DM: - SSI - A1c  GERD: - continue ppi  PVD: - continue pletal  Gout: - continue allopurinol    Code Status: FULL  DVT Prophylaxis: Hep Family Communication: none Disposition Plan: Pending Improvement    Farida Mcreynolds J, MD Family Medicine [facility] [email or web address] Password [email or web address]

## 2015-08-10 NOTE — ED Notes (Signed)
Pt lives at independent living and was found in hallway of apartment this morning.  Pt was last seen last night around 2100.  Pt initial CBG was 25.  Two glasses orange juice given and oral glucose by EMS.  Pt CBG 64 upon arrival.  Pt A&Ox4.

## 2015-08-10 NOTE — ED Notes (Signed)
MD aware of Patient BP

## 2015-08-10 NOTE — Progress Notes (Signed)
UR COMPLETED  

## 2015-08-10 NOTE — ED Notes (Signed)
bearhugger applied.

## 2015-08-10 NOTE — ED Provider Notes (Signed)
CSN: [id number]     Arrival date & time 08/10/15  1115 History   First MD Initiated Contact with Patient 08/10/15 1115     Chief Complaint  Patient presents with  . Hypoglycemia   Patient is a 69 y.o. female presenting with hypoglycemia. The history is provided by the EMS personnel.  Hypoglycemia Initial blood sugar:  40s Blood sugar after intervention:  60s Onset quality:  Sudden Timing:  Constant Chronicity:  New Diabetic status:  Controlled with insulin Relieved by:  Oral glucose Associated symptoms: altered mental status   Associated symptoms: no dizziness, no seizures, no shortness of breath, no tremors and no vomiting     Past Medical History  Diagnosis Date  . Alcoholic cirrhosis ([place])   . Reflux esophagitis   . Gastroesophageal reflux   . Colon polyps   . CHF (congestive heart failure) ([place]) 10/13    grade 1 diastolic dysfunction, Nl LVF  . Hypertension   . History of GI diverticular bleed   . Pneumonia     history of  . Asthma   . Anemia   . Blood transfusion few years ago  . Villous adenoma of colon 05/10/11  . PVD (peripheral vascular disease) ([place]) 2009    bilat iliac stenting  . Chronic obstructive pulmonary disease (COPD) ([place])   . Arthritis   . Glaucoma     left  . Gout   . Acute venous embolism and thrombosis of deep vessels of proximal lower extremity ([place])   . Back pain   . Leg swelling   . Diabetes mellitus     insulin dep   . Hepatitis     alcoholic hepatitis  . Depression   . Anxiety   . On home oxygen therapy oxygen 2 liter per minute prn per [place]  . Cellulitis     bilateral lower extremities  . Chronic diastolic congestive heart failure [facility])    Past Surgical History  Procedure Laterality Date  . Cataract extraction Right yrs ago  . Colonoscopy    . Laparoscopic assissted total colectomy w/ j-pouch  04/22/11  . Ventral hernia repair  06/13/2012    Procedure: LAPAROSCOPIC VENTRAL HERNIA;  Surgeon: Adin Hector, MD;  Location: [facility];   Service: General;  Laterality: N/A;  Laparoscopic Assisted Ventral Hernia with Mesh  . Abdominal hysterectomy  yrs ago  . Esophagogastroduodenoscopy (egd) with propofol N/A 08/06/2013    Procedure: ESOPHAGOGASTRODUODENOSCOPY (EGD) WITH PROPOFOL;  Surgeon: Lear Ng, MD;  Location: [facility];  Service: Endoscopy;  Laterality: N/A;  . Colonoscopy with propofol N/A 08/06/2013    Procedure: COLONOSCOPY WITH PROPOFOL;  Surgeon: Lear Ng, MD;  Location: [facility];  Service: Endoscopy;  Laterality: N/A;   Family History  Problem Relation Age of Onset  . Cancer Brother     heent ca  . Cancer Sister     liver ca   Social History  Substance Use Topics  . Smoking status: Former Smoker -- 0.50 packs/day for 30 years    Types: Cigarettes    Quit date: 11/06/2005  . Smokeless tobacco: Never Used  . Alcohol Use: No     Comment: stopped 2005   OB History    No data available     Review of Systems  Constitutional: Negative for fever.  HENT: Negative for rhinorrhea and sore throat.   Eyes: Negative for visual disturbance.  Respiratory: Negative for chest tightness and shortness of breath.   Cardiovascular: Negative for chest pain  and palpitations.  Gastrointestinal: Negative for nausea, vomiting, abdominal pain and constipation.  Genitourinary: Negative for dysuria and hematuria.  Musculoskeletal: Negative for back pain and neck pain.  Skin: Negative for rash.  Neurological: Negative for dizziness, tremors, seizures and headaches.  Psychiatric/Behavioral: Negative for confusion.  All other systems reviewed and are negative.  Allergies  Penicillins and Lipitor  Home Medications   Prior to Admission medications   Medication Sig Start Date End Date Taking? Authorizing Provider  albuterol (PROVENTIL HFA;VENTOLIN HFA) 108 (90 BASE) MCG/ACT inhaler Inhale 2 puffs into the lungs every 6 (six) hours as needed. For shortness of breath    Historical Provider, MD   albuterol (PROVENTIL) (2.5 MG/3ML) 0.083% nebulizer solution Take 2.5 mg by nebulization every 6 (six) hours as needed for wheezing or shortness of breath.    Historical Provider, MD  allopurinol (ZYLOPRIM) 100 MG tablet Take 100 mg by mouth 2 (two) times daily.    Historical Provider, MD  aspirin 81 MG EC tablet Take 81 mg by mouth daily.      Historical Provider, MD  carvedilol (COREG) 6.25 MG tablet Take 1 tablet (6.25 mg total) by mouth 2 (two) times daily with a meal. 09/05/14   [name], MD  cilostazol (PLETAL) 100 MG tablet Take 50 mg by mouth 2 (two) times daily before a meal.     Historical Provider, MD  cloNIDine (CATAPRES) 0.1 MG tablet Take 1 tablet (0.1 mg total) by mouth 3 (three) times daily. 09/05/14   [name], MD  ergocalciferol (VITAMIN D2) 50000 UNITS capsule Take 50,000 Units by mouth every Monday. On Monday    Historical Provider, MD  fluticasone (FLONASE) 50 MCG/ACT nasal spray Place 2 sprays into the nose daily as needed for allergies.     Historical Provider, MD  glucose monitoring kit (FREESTYLE) monitoring kit 1 each by Does not apply route 4 (four) times daily - after meals and at bedtime. 1 month Diabetic Testing Supplies for QAC-QHS accuchecks.Any brand OK 07/30/14   Thurnell Lose, MD  Magnesium Oxide 250 MG TABS Take 1 tablet (250 mg total) by mouth daily. 05/12/15   Oswald Hillock, MD  metoprolol (LOPRESSOR) 50 MG tablet Take 25 mg by mouth 2 (two) times daily. 03/04/15   Historical Provider, MD  montelukast (SINGULAIR) 10 MG tablet Take 10 mg by mouth at bedtime.     Historical Provider, MD  omeprazole (PRILOSEC) 40 MG capsule Take 40 mg by mouth daily.     Historical Provider, MD  OXYGEN-HELIUM IN Inhale 2 L into the lungs at bedtime. 2 liters nightly    Historical Provider, MD  simvastatin (ZOCOR) 10 MG tablet Take 10 mg by mouth daily.  11/30/14   Historical Provider, MD  traMADol (ULTRAM) 50 MG tablet Take 1 tablet (50 mg total) by mouth every 6 (six)  hours as needed. 06/02/15   Carmin Muskrat, MD   BP 104/68 mmHg  Pulse 110  Temp(Src) 95.4 F (35.2 C) (Rectal)  Resp 18  SpO2 97% Physical Exam  Constitutional: She appears well-developed and well-nourished. No distress.  HENT:  Head: Normocephalic and atraumatic.  Mouth/Throat: Oropharynx is clear and moist.  Eyes: EOM are normal. Pupils are equal, round, and reactive to light.  Neck: Neck supple. No JVD present.  Cardiovascular: Regular rhythm, normal heart sounds and intact distal pulses.  Tachycardia present.  Exam reveals no gallop.   No murmur heard. Pulmonary/Chest: Effort normal and breath sounds normal. She has no  wheezes. She has no rales.  Abdominal: Soft. She exhibits no distension. There is no tenderness.  Musculoskeletal: Normal range of motion. She exhibits no tenderness.  Neurological: She is alert. She is disoriented. No cranial nerve deficit. She exhibits normal muscle tone.  Skin: Skin is warm and dry. No rash noted.  Psychiatric: Her behavior is normal.    ED Course  Procedures  None   Labs Review Labs Reviewed  CBC WITH DIFFERENTIAL/PLATELET - Abnormal; Notable for the following:    WBC 14.6 (*)    Hemoglobin 11.3 (*)    HCT 35.7 (*)    Neutro Abs 9.6 (*)    Lymphs Abs 4.2 (*)    All other components within normal limits  COMPREHENSIVE METABOLIC PANEL - Abnormal; Notable for the following:    Creatinine, Ser 1.32 (*)    Calcium 8.8 (*)    Total Protein 6.0 (*)    Albumin 2.1 (*)    Alkaline Phosphatase 182 (*)    GFR calc non Af Amer 40 (*)    GFR calc Af Amer 46 (*)    All other components within normal limits  URINALYSIS, ROUTINE W REFLEX MICROSCOPIC (NOT AT [facility]) - Abnormal; Notable for the following:    APPearance HAZY (*)    Hgb urine dipstick TRACE (*)    Leukocytes, UA MODERATE (*)    All other components within normal limits  URINE MICROSCOPIC-ADD ON - Abnormal; Notable for the following:    Squamous Epithelial / LPF 0-5 (*)     Bacteria, UA MANY (*)    All other components within normal limits  CBG MONITORING, ED - Abnormal; Notable for the following:    Glucose-Capillary 63 (*)    All other components within normal limits  CBG MONITORING, ED - Abnormal; Notable for the following:    Glucose-Capillary 102 (*)    All other components within normal limits  LACTIC ACID, PLASMA  LACTIC ACID, PLASMA  CBG MONITORING, ED    Imaging Review Dg Chest 2 View  08/10/2015  CLINICAL DATA:  Fall.  Hypoglycemia. EXAM: CHEST  2 VIEW COMPARISON:  06/15/2014 FINDINGS: Minimal bibasilar opacities, likely atelectasis or scarring. Heart is normal size. No effusions. No acute bony abnormality. Degenerative changes in the shoulders and thoracic spine. Multiple compression fractures in the mid thoracic spine, likely chronic. IMPRESSION: Bibasilar scarring or atelectasis. Electronically Signed   By: Rolm Baptise M.D.   On: 08/10/2015 13:22   Ct Head Wo Contrast  08/10/2015  CLINICAL DATA:  Status post fall. EXAM: CT HEAD WITHOUT CONTRAST CT CERVICAL SPINE WITHOUT CONTRAST TECHNIQUE: Multidetector CT imaging of the head and cervical spine was performed following the standard protocol without intravenous contrast. Multiplanar CT image reconstructions of the cervical spine were also generated. COMPARISON:  06/14/2014 FINDINGS: CT HEAD FINDINGS There is no evidence of mass effect, midline shift, or extra-axial fluid collections. There is no evidence of a space-occupying lesion or intracranial hemorrhage. There is no evidence of a cortical-based area of acute infarction. There is generalized cerebral atrophy. There is periventricular white matter low attenuation likely secondary to microangiopathy. The ventricles and sulci are appropriate for the patient's age. The basal cisterns are patent. Visualized portions of the orbits are unremarkable. There is chronic right maxillary sinus disease partially visualized. Cerebrovascular atherosclerotic  calcifications are noted. The osseous structures are unremarkable. CT CERVICAL SPINE FINDINGS The alignment is anatomic. The vertebral body heights are maintained. There is no acute fracture. 4 mm of anterolisthesis of C3  on C4 secondary to severe left facet disease. The prevertebral soft tissues are normal. The intraspinal soft tissues are not fully imaged on this examination due to poor soft tissue contrast, but there is no gross soft tissue abnormality. Degenerative disc disease at C3-4, C4-5, C5-6 and C6-7. Broad-based disc osteophyte complexes at C4-5, C5-6 and C6-7. Mild broad-based disc bulge at C7-T1. Moderate left facet arthropathy at C3-4 with mild erosive changes. Mild left facet arthropathy at C4-5. Bilateral uncovertebral degenerative changes at C5-6 with left foraminal narrowing. Right uncovertebral degenerative change at C6-7 with right foraminal narrowing. There is osseous fusion of the right C4-5 posterior elements. The visualized portions of the lung apices demonstrate no focal abnormality. There is bilateral carotid artery atherosclerosis. IMPRESSION: 1. No acute intracranial pathology. 2. No acute osseous injury of the cervical spine. Electronically Signed   By: Kathreen Devoid   On: 08/10/2015 13:12   Ct Cervical Spine Wo Contrast  08/10/2015  CLINICAL DATA:  Status post fall. EXAM: CT HEAD WITHOUT CONTRAST CT CERVICAL SPINE WITHOUT CONTRAST TECHNIQUE: Multidetector CT imaging of the head and cervical spine was performed following the standard protocol without intravenous contrast. Multiplanar CT image reconstructions of the cervical spine were also generated. COMPARISON:  06/14/2014 FINDINGS: CT HEAD FINDINGS There is no evidence of mass effect, midline shift, or extra-axial fluid collections. There is no evidence of a space-occupying lesion or intracranial hemorrhage. There is no evidence of a cortical-based area of acute infarction. There is generalized cerebral atrophy. There is  periventricular white matter low attenuation likely secondary to microangiopathy. The ventricles and sulci are appropriate for the patient's age. The basal cisterns are patent. Visualized portions of the orbits are unremarkable. There is chronic right maxillary sinus disease partially visualized. Cerebrovascular atherosclerotic calcifications are noted. The osseous structures are unremarkable. CT CERVICAL SPINE FINDINGS The alignment is anatomic. The vertebral body heights are maintained. There is no acute fracture. 4 mm of anterolisthesis of C3 on C4 secondary to severe left facet disease. The prevertebral soft tissues are normal. The intraspinal soft tissues are not fully imaged on this examination due to poor soft tissue contrast, but there is no gross soft tissue abnormality. Degenerative disc disease at C3-4, C4-5, C5-6 and C6-7. Broad-based disc osteophyte complexes at C4-5, C5-6 and C6-7. Mild broad-based disc bulge at C7-T1. Moderate left facet arthropathy at C3-4 with mild erosive changes. Mild left facet arthropathy at C4-5. Bilateral uncovertebral degenerative changes at C5-6 with left foraminal narrowing. Right uncovertebral degenerative change at C6-7 with right foraminal narrowing. There is osseous fusion of the right C4-5 posterior elements. The visualized portions of the lung apices demonstrate no focal abnormality. There is bilateral carotid artery atherosclerosis. IMPRESSION: 1. No acute intracranial pathology. 2. No acute osseous injury of the cervical spine. Electronically Signed   By: Kathreen Devoid   On: 08/10/2015 13:12   I have personally reviewed and evaluated these images and lab results as part of my medical decision-making.   EKG Interpretation   Date/Time:  Monday August 10 2015 11:38:16 EST Ventricular Rate:  99 PR Interval:    QRS Duration: 120 QT Interval:  391 QTC Calculation: 502 R Axis:   43 Text Interpretation:  Sinus tachycardia poor baseline Confirmed by ZAVITZ    MD, JOSHUA ([id number]) on 08/10/2015 1:40:08 PM       MDM   Final diagnoses:  Hypothermia, initial encounter  Hypoglycemia  UTI (lower urinary tract infection)  Sepsis, due to unspecified organism ([place])   Found down  in home in hallway by caretaker. Last seen normal last night between 9-11pm. No seizure history. Recent right humerus fracture and was in a rehab facility. Just went home recently, lives along with a caretaker who intermittently checks in on her. Unwitnessed fall. Hypoglycemic by EMS, given oral glucose which improved. Glucose 60s here. US guided IV placed in left AC. Gave juice. Right orbital ecchymosis. Will get CT head and C spine, labs, EKG, CXR.   Glucose improved to 96 after oral glucose challenge. Imaging negative for acute findings. Leukocytosis. UA consistent with UTI. Started cipro (PCN allergic). Patient noted to be hypothermic to 93.13F rectally. Bare hugger placed. During ED course, patient become more tachycardic with lower BP. More IVFs given, lactate drawn. Feel this is urosepsis. Consulted hospitalist for admission. Will go to stepdown unit.  Discussed with Dr. Reather Converse.  Gustavus Bryant, MD 08/10/15 1540  Elnora Morrison, MD 08/10/15 [phone]

## 2015-08-10 NOTE — Progress Notes (Signed)
ANTIBIOTIC CONSULT NOTE - INITIAL  Pharmacy Consult for ceftazidime and levaquin Indication: urosepsis  Allergies  Allergen Reactions  . Penicillins Hives    Has patient had a PCN reaction causing immediate rash, facial/tongue/throat swelling, SOB or lightheadedness with hypotension: No Has patient had a PCN reaction causing severe rash involving mucus membranes or skin necrosis: No Has patient had a PCN reaction that required hospitalization No Has patient had a PCN reaction occurring within the last 10 years: No If all of the above answers are "NO", then may proceed with Cephalosporin use.   . Lipitor [Atorvastatin] Swelling    Patient Measurements: Height: 5' (152.4 cm) Weight: 121 lb 14.6 oz (55.3 kg) IBW/kg (Calculated) : 45.5 Adjusted Body Weight:   Vital Signs: Temp: 98.2 F (36.8 C) (12/12 1707) Temp Source: Oral (12/12 1707) BP: 127/77 mmHg (12/12 1707) Pulse Rate: 121 (12/12 1707) Intake/Output from previous day:   Intake/Output from this shift:    Labs:  Recent Labs  08/10/15 1148  WBC 14.6*  HGB 11.3*  PLT 331  CREATININE 1.32*   Estimated Creatinine Clearance: 30.5 mL/min (by C-G formula based on Cr of 1.32). No results for input(s): VANCOTROUGH, VANCOPEAK, VANCORANDOM, GENTTROUGH, GENTPEAK, GENTRANDOM, TOBRATROUGH, TOBRAPEAK, TOBRARND, AMIKACINPEAK, AMIKACINTROU, AMIKACIN in the last 72 hours.   Microbiology: No results found for this or any previous visit (from the past 720 hour(s)).  Medical History: Past Medical History  Diagnosis Date  . Alcoholic cirrhosis ([place])   . Reflux esophagitis   . Gastroesophageal reflux   . Colon polyps   . CHF (congestive heart failure) ([place]) 10/13    grade 1 diastolic dysfunction, Nl LVF  . Hypertension   . History of GI diverticular bleed   . Pneumonia     history of  . Asthma   . Anemia   . Blood transfusion few years ago  . Villous adenoma of colon 05/10/11  . PVD (peripheral vascular disease) ([place])  2009    bilat iliac stenting  . Chronic obstructive pulmonary disease (COPD) ([place])   . Arthritis   . Glaucoma     left  . Gout   . Acute venous embolism and thrombosis of deep vessels of proximal lower extremity ([place])   . Back pain   . Leg swelling   . Diabetes mellitus     insulin dep   . Hepatitis     alcoholic hepatitis  . Depression   . Anxiety   . On home oxygen therapy oxygen 2 liter per minute prn per [place]  . Cellulitis     bilateral lower extremities  . Chronic diastolic congestive heart failure ([facility])     Medications:  Scheduled:  . allopurinol  100 mg Oral BID  . aspirin EC  81 mg Oral Daily  . cilostazol  50 mg Oral BID AC  . heparin  5,000 Units Subcutaneous 3 times per day  . insulin aspart  0-5 Units Subcutaneous QHS  . [START ON 08/11/2015] insulin aspart  0-9 Units Subcutaneous TID WC  . [START ON 08/11/2015] levofloxacin (LEVAQUIN) IV  750 mg Intravenous Q24H  . magnesium oxide  200 mg Oral Daily  . montelukast  10 mg Oral QHS  . pantoprazole  80 mg Oral Daily  . sodium chloride  3 mL Intravenous Q12H   Infusions:  . sodium chloride     Assessment: 69 yo female with urosepsis will be put on ceftazidime and levaquin.  CrCl ~31.  Have been on ancef previously without any  problem  Goal of Therapy:  Resolution of infection  Plan:  - levaquin 750 mg iv q48h - ceftazidime 1g iv q12h - follow culture and sensitivity  Abrian Hanover, Tsz-Yin 08/10/2015,5:55 PM

## 2015-08-10 NOTE — Progress Notes (Signed)
CRITICAL VALUE ALERT  Critical value received:  cbg 60  Date of notification:  08/10/15  Time of notification:  5750  Critical value read back:Yes.    Nurse who received alert:  Zigmund Gottron, RN  MD notified (1st page):  MD Marily Memos  Time of first page:  1815  MD notified (2nd page):  Time of second page:  Responding MD:  MD Marily Memos  Time MD responded:  [phone]

## 2015-08-10 NOTE — ED Notes (Signed)
Attempted report RN states unable to take.

## 2015-08-10 NOTE — ED Notes (Signed)
MD aware of rectal temp.

## 2015-08-10 NOTE — ED Notes (Signed)
MD made aware of CBG MD also made aware patient HR increasing and BP decreasing.

## 2015-08-10 NOTE — Progress Notes (Signed)
Pt arrived to [place], MD notified. HR ST, other vitals stable. No skin breakdown noted. Pt A&Ox4. Pt states the last thing she remembers prior to her fall is that she gave herself long acting insulin and she went to bed. She states, "I normally eat a pb&j with my long acting insulin but that night I was too tired". Will continue to monitor.

## 2015-08-10 NOTE — Progress Notes (Signed)
CRITICAL VALUE ALERT  Critical value received:  Lactic acid 2.4  Date of notification:  08/10/2015  Time of notification:  2215  Critical value read back:Yes.    Nurse who received alert:  Dorene Grebe, RN  MD notified (1st page):  Tylene Fantasia, NP  Time of first page:  2219  MD notified (2nd page):  Time of second page:  Responding MD:  Tylene Fantasia, NP  Time MD responded:  2221, orders received for 500 mL bolus. Will continue to monitor.  Sherlie Ban, RN

## 2015-08-10 NOTE — ED Notes (Signed)
Patient has discoloration to the right eye. Patient denies any complaints  with vision.

## 2015-08-11 ENCOUNTER — Inpatient Hospital Stay (HOSPITAL_COMMUNITY): Payer: Medicare Other

## 2015-08-11 DIAGNOSIS — J449 Chronic obstructive pulmonary disease, unspecified: Secondary | ICD-10-CM | POA: Diagnosis not present

## 2015-08-11 DIAGNOSIS — N179 Acute kidney failure, unspecified: Secondary | ICD-10-CM | POA: Diagnosis not present

## 2015-08-11 DIAGNOSIS — G9340 Encephalopathy, unspecified: Secondary | ICD-10-CM | POA: Diagnosis not present

## 2015-08-11 DIAGNOSIS — T68XXXA Hypothermia, initial encounter: Secondary | ICD-10-CM | POA: Diagnosis not present

## 2015-08-11 DIAGNOSIS — E872 Acidosis: Secondary | ICD-10-CM | POA: Diagnosis not present

## 2015-08-11 DIAGNOSIS — J690 Pneumonitis due to inhalation of food and vomit: Secondary | ICD-10-CM | POA: Diagnosis not present

## 2015-08-11 DIAGNOSIS — Z79899 Other long term (current) drug therapy: Secondary | ICD-10-CM | POA: Diagnosis not present

## 2015-08-11 DIAGNOSIS — Z9981 Dependence on supplemental oxygen: Secondary | ICD-10-CM | POA: Diagnosis not present

## 2015-08-11 DIAGNOSIS — J45909 Unspecified asthma, uncomplicated: Secondary | ICD-10-CM | POA: Diagnosis not present

## 2015-08-11 DIAGNOSIS — N390 Urinary tract infection, site not specified: Secondary | ICD-10-CM | POA: Diagnosis not present

## 2015-08-11 DIAGNOSIS — Z7982 Long term (current) use of aspirin: Secondary | ICD-10-CM | POA: Diagnosis not present

## 2015-08-11 DIAGNOSIS — M109 Gout, unspecified: Secondary | ICD-10-CM | POA: Diagnosis not present

## 2015-08-11 DIAGNOSIS — A419 Sepsis, unspecified organism: Secondary | ICD-10-CM | POA: Diagnosis not present

## 2015-08-11 DIAGNOSIS — I5032 Chronic diastolic (congestive) heart failure: Secondary | ICD-10-CM | POA: Diagnosis not present

## 2015-08-11 DIAGNOSIS — Z90710 Acquired absence of both cervix and uterus: Secondary | ICD-10-CM | POA: Diagnosis not present

## 2015-08-11 DIAGNOSIS — E1151 Type 2 diabetes mellitus with diabetic peripheral angiopathy without gangrene: Secondary | ICD-10-CM | POA: Diagnosis not present

## 2015-08-11 DIAGNOSIS — Z880 Allergy status to penicillin: Secondary | ICD-10-CM | POA: Diagnosis not present

## 2015-08-11 DIAGNOSIS — Z87891 Personal history of nicotine dependence: Secondary | ICD-10-CM | POA: Diagnosis not present

## 2015-08-11 DIAGNOSIS — Z888 Allergy status to other drugs, medicaments and biological substances status: Secondary | ICD-10-CM | POA: Diagnosis not present

## 2015-08-11 DIAGNOSIS — J9621 Acute and chronic respiratory failure with hypoxia: Secondary | ICD-10-CM | POA: Diagnosis not present

## 2015-08-11 DIAGNOSIS — Z9841 Cataract extraction status, right eye: Secondary | ICD-10-CM | POA: Diagnosis not present

## 2015-08-11 DIAGNOSIS — E11649 Type 2 diabetes mellitus with hypoglycemia without coma: Secondary | ICD-10-CM | POA: Diagnosis not present

## 2015-08-11 DIAGNOSIS — K219 Gastro-esophageal reflux disease without esophagitis: Secondary | ICD-10-CM | POA: Diagnosis not present

## 2015-08-11 DIAGNOSIS — S0511XA Contusion of eyeball and orbital tissues, right eye, initial encounter: Secondary | ICD-10-CM | POA: Diagnosis not present

## 2015-08-11 DIAGNOSIS — I10 Essential (primary) hypertension: Secondary | ICD-10-CM | POA: Diagnosis not present

## 2015-08-11 LAB — COMPREHENSIVE METABOLIC PANEL
ALK PHOS: 122 U/L (ref 38–126)
ALT: 22 U/L (ref 14–54)
AST: 39 U/L (ref 15–41)
Albumin: 1.5 g/dL — ABNORMAL LOW (ref 3.5–5.0)
Anion gap: 8 (ref 5–15)
BUN: 9 mg/dL (ref 6–20)
CALCIUM: 7.6 mg/dL — AB (ref 8.9–10.3)
CHLORIDE: 109 mmol/L (ref 101–111)
CO2: 22 mmol/L (ref 22–32)
CREATININE: 1.11 mg/dL — AB (ref 0.44–1.00)
GFR, EST AFRICAN AMERICAN: 57 mL/min — AB (ref >60)
GFR, EST NON AFRICAN AMERICAN: 49 mL/min — AB (ref >60)
Glucose, Bld: 183 mg/dL — ABNORMAL HIGH (ref 65–99)
Potassium: 3.4 mmol/L — ABNORMAL LOW (ref 3.5–5.1)
Sodium: 139 mmol/L (ref 135–145)
TOTAL PROTEIN: 4.4 g/dL — AB (ref 6.5–8.1)
Total Bilirubin: 0.5 mg/dL (ref 0.3–1.2)

## 2015-08-11 LAB — GLUCOSE, CAPILLARY
GLUCOSE-CAPILLARY: 233 mg/dL — AB (ref 65–99)
Glucose-Capillary: 139 mg/dL — ABNORMAL HIGH (ref 65–99)
Glucose-Capillary: 85 mg/dL (ref 65–99)
Glucose-Capillary: 109 mg/dL — ABNORMAL HIGH (ref 65–99)

## 2015-08-11 LAB — CBC
HCT: 27.1 % — ABNORMAL LOW (ref 36.0–46.0)
Hemoglobin: 8.7 g/dL — ABNORMAL LOW (ref 12.0–15.0)
MCH: 27.3 pg (ref 26.0–34.0)
MCHC: 32.1 g/dL (ref 30.0–36.0)
MCV: 85 fL (ref 78.0–100.0)
PLATELETS: 255 10*3/uL (ref 150–400)
RBC: 3.19 MIL/uL — AB (ref 3.87–5.11)
RDW: 15 % (ref 11.5–15.5)
WBC: 10.1 10*3/uL (ref 4.0–10.5)

## 2015-08-11 LAB — CK: CK TOTAL: 167 U/L (ref 38–234)

## 2015-08-11 LAB — LACTIC ACID, PLASMA: Lactic Acid, Venous: 1.7 mmol/L (ref 0.5–2.0)

## 2015-08-11 LAB — URIC ACID: URIC ACID, SERUM: 2.4 mg/dL (ref 2.3–6.6)

## 2015-08-11 MED ORDER — SIMVASTATIN 20 MG PO TABS
10.0000 mg | ORAL_TABLET | Freq: Every day | ORAL | Status: DC
  Administered 2015-08-11 – 2015-08-13 (×3): 10 mg via ORAL
  Filled 2015-08-11 (×4): qty 1

## 2015-08-11 MED ORDER — CARVEDILOL 6.25 MG PO TABS
6.2500 mg | ORAL_TABLET | Freq: Two times a day (BID) | ORAL | Status: DC
  Administered 2015-08-11 – 2015-08-13 (×5): 6.25 mg via ORAL
  Filled 2015-08-11 (×5): qty 1

## 2015-08-11 MED ORDER — DEXTROSE 5 % IV SOLN
1.0000 g | INTRAVENOUS | Status: DC
  Administered 2015-08-11 – 2015-08-12 (×2): 1 g via INTRAVENOUS
  Filled 2015-08-11 (×3): qty 10

## 2015-08-11 MED ORDER — PNEUMOCOCCAL VAC POLYVALENT 25 MCG/0.5ML IJ INJ
0.5000 mL | INJECTION | INTRAMUSCULAR | Status: AC
  Administered 2015-08-11: 0.5 mL via INTRAMUSCULAR
  Filled 2015-08-11: qty 0.5

## 2015-08-11 MED ORDER — METOPROLOL TARTRATE 1 MG/ML IV SOLN
2.5000 mg | Freq: Four times a day (QID) | INTRAVENOUS | Status: DC | PRN
  Administered 2015-08-11 – 2015-08-12 (×2): 2.5 mg via INTRAVENOUS
  Filled 2015-08-11 (×2): qty 5

## 2015-08-11 MED ORDER — VITAMIN D (ERGOCALCIFEROL) 1.25 MG (50000 UNIT) PO CAPS: 50000.0000 [IU] | ORAL_CAPSULE | ORAL | Status: DC

## 2015-08-11 MED ORDER — MORPHINE SULFATE (PF) 2 MG/ML IV SOLN
2.0000 mg | INTRAVENOUS | Status: DC | PRN
Start: 2015-08-11 — End: 2015-08-12
  Administered 2015-08-11 (×2): 2 mg via INTRAVENOUS
  Filled 2015-08-11 (×2): qty 1

## 2015-08-11 NOTE — Progress Notes (Signed)
Patient HR sustaining 115-140s. Dr. Venetia Constable paged. New orders given. Will administer and cont to monitor.

## 2015-08-11 NOTE — Progress Notes (Signed)
[facility] PROGRESS NOTE    Progress Note   [name] TGG:[id number] DOB: 1945-11-03 DOA: 08/10/2015 PCP: Philis Fendt, MD   Brief Narrative:   [name] is an 69 y.o. female  diastolic heart failure that comes in for sepsis due to UTI and a fall.  Assessment/Plan:   Sepsis ([place]) due to UTI - Started empirically on Tressie Ellis will de-escalate to Rocephin. - Her lactic acidosis cleared. - Hypothermia did resolve blood pressure has improved. - She still tachycardic probably rebound tachycardia due to holding beta blockers.  AKI: - Likely prerenal in the setting of sepsis continue IV fluid hydration she does have grade 1 diastolic heart failure. - Check CK.  Left arm pain/Gout: Patient cannot verbalize clear as it bother her the most in her left arm. When you try to evaluated at the wrist elbow or shoulder causes excruciating pain with passive movement. Start IV morphine, x-ray shoulder elbow and wrist. - Question if this is due to gout, check a uric acid. - As it is elevated we'll need to start treatment for gout, avoid NSAIDs due to recent renal failure. His colchicine.  COPD (chronic obstructive pulmonary disease) ([place]): - continue albuterol currently stable.  Right eye ecchymosis: Likely due to fall CT without evidence of fracture.  PVD, LEIA/LIIA and RCIA pta 7/09- ABIs 0.68 and 0.69 Feb 2014  Acute encephalopathy Likely due to infectious etiology now resolved.  Essential hypertension Pressure not improve continue to hold antihypertensive medication, start Coreg as she is tachycardic probably rebound tachycardia.  Hypoglycemia No resolved. Continue holding someone's.    DVT Prophylaxis - Lovenox ordered.  Family Communication: none Disposition Plan: Home when stable. Code Status:     Code Status Orders        Start     Ordered   08/10/15 1726  Full code   Continuous     08/10/15 1741        IV Access:    Peripheral  IV   Procedures and diagnostic studies:   Dg Chest 2 View  08/10/2015  CLINICAL DATA:  Fall.  Hypoglycemia. EXAM: CHEST  2 VIEW COMPARISON:  06/15/2014 FINDINGS: Minimal bibasilar opacities, likely atelectasis or scarring. Heart is normal size. No effusions. No acute bony abnormality. Degenerative changes in the shoulders and thoracic spine. Multiple compression fractures in the mid thoracic spine, likely chronic. IMPRESSION: Bibasilar scarring or atelectasis. Electronically Signed   By: Rolm Baptise M.D.   On: 08/10/2015 13:22   Ct Head Wo Contrast  08/10/2015  CLINICAL DATA:  Status post fall. EXAM: CT HEAD WITHOUT CONTRAST CT CERVICAL SPINE WITHOUT CONTRAST TECHNIQUE: Multidetector CT imaging of the head and cervical spine was performed following the standard protocol without intravenous contrast. Multiplanar CT image reconstructions of the cervical spine were also generated. COMPARISON:  06/14/2014 FINDINGS: CT HEAD FINDINGS There is no evidence of mass effect, midline shift, or extra-axial fluid collections. There is no evidence of a space-occupying lesion or intracranial hemorrhage. There is no evidence of a cortical-based area of acute infarction. There is generalized cerebral atrophy. There is periventricular white matter low attenuation likely secondary to microangiopathy. The ventricles and sulci are appropriate for the patient's age. The basal cisterns are patent. Visualized portions of the orbits are unremarkable. There is chronic right maxillary sinus disease partially visualized. Cerebrovascular atherosclerotic calcifications are noted. The osseous structures are unremarkable. CT CERVICAL SPINE FINDINGS The alignment is anatomic. The vertebral body heights are maintained. There is no acute fracture. 4 mm  of anterolisthesis of C3 on C4 secondary to severe left facet disease. The prevertebral soft tissues are normal. The intraspinal soft tissues are not fully imaged on this examination due to  poor soft tissue contrast, but there is no gross soft tissue abnormality. Degenerative disc disease at C3-4, C4-5, C5-6 and C6-7. Broad-based disc osteophyte complexes at C4-5, C5-6 and C6-7. Mild broad-based disc bulge at C7-T1. Moderate left facet arthropathy at C3-4 with mild erosive changes. Mild left facet arthropathy at C4-5. Bilateral uncovertebral degenerative changes at C5-6 with left foraminal narrowing. Right uncovertebral degenerative change at C6-7 with right foraminal narrowing. There is osseous fusion of the right C4-5 posterior elements. The visualized portions of the lung apices demonstrate no focal abnormality. There is bilateral carotid artery atherosclerosis. IMPRESSION: 1. No acute intracranial pathology. 2. No acute osseous injury of the cervical spine. Electronically Signed   By: Kathreen Devoid   On: 08/10/2015 13:12   Ct Cervical Spine Wo Contrast  08/10/2015  CLINICAL DATA:  Status post fall. EXAM: CT HEAD WITHOUT CONTRAST CT CERVICAL SPINE WITHOUT CONTRAST TECHNIQUE: Multidetector CT imaging of the head and cervical spine was performed following the standard protocol without intravenous contrast. Multiplanar CT image reconstructions of the cervical spine were also generated. COMPARISON:  06/14/2014 FINDINGS: CT HEAD FINDINGS There is no evidence of mass effect, midline shift, or extra-axial fluid collections. There is no evidence of a space-occupying lesion or intracranial hemorrhage. There is no evidence of a cortical-based area of acute infarction. There is generalized cerebral atrophy. There is periventricular white matter low attenuation likely secondary to microangiopathy. The ventricles and sulci are appropriate for the patient's age. The basal cisterns are patent. Visualized portions of the orbits are unremarkable. There is chronic right maxillary sinus disease partially visualized. Cerebrovascular atherosclerotic calcifications are noted. The osseous structures are unremarkable.  CT CERVICAL SPINE FINDINGS The alignment is anatomic. The vertebral body heights are maintained. There is no acute fracture. 4 mm of anterolisthesis of C3 on C4 secondary to severe left facet disease. The prevertebral soft tissues are normal. The intraspinal soft tissues are not fully imaged on this examination due to poor soft tissue contrast, but there is no gross soft tissue abnormality. Degenerative disc disease at C3-4, C4-5, C5-6 and C6-7. Broad-based disc osteophyte complexes at C4-5, C5-6 and C6-7. Mild broad-based disc bulge at C7-T1. Moderate left facet arthropathy at C3-4 with mild erosive changes. Mild left facet arthropathy at C4-5. Bilateral uncovertebral degenerative changes at C5-6 with left foraminal narrowing. Right uncovertebral degenerative change at C6-7 with right foraminal narrowing. There is osseous fusion of the right C4-5 posterior elements. The visualized portions of the lung apices demonstrate no focal abnormality. There is bilateral carotid artery atherosclerosis. IMPRESSION: 1. No acute intracranial pathology. 2. No acute osseous injury of the cervical spine. Electronically Signed   By: Kathreen Devoid   On: 08/10/2015 13:12     Medical Consultants:    None.  Anti-Infectives:   Anti-infectives    Start     Dose/Rate Route Frequency Ordered Stop   08/11/15 0600  levofloxacin (LEVAQUIN) IVPB 750 mg  Status:  Discontinued     750 mg 100 mL/hr over 90 Minutes Intravenous  Once 08/10/15 1741 08/10/15 1755   08/11/15 0600  levofloxacin (LEVAQUIN) IVPB 750 mg  Status:  Discontinued     750 mg 100 mL/hr over 90 Minutes Intravenous Every 24 hours 08/10/15 1755 08/10/15 1757   08/11/15 0600  levofloxacin (LEVAQUIN) IVPB 750 mg  750 mg 100 mL/hr over 90 Minutes Intravenous Every 48 hours 08/10/15 1757     08/10/15 1800  cefTAZidime (FORTAZ) 1 g in dextrose 5 % 50 mL IVPB     1 g 100 mL/hr over 30 Minutes Intravenous Every 12 hours 08/10/15 1757     08/10/15 1745  aztreonam  (AZACTAM) 2 g in dextrose 5 % 50 mL IVPB  Status:  Discontinued     2 g 100 mL/hr over 30 Minutes Intravenous  Once 08/10/15 1741 08/10/15 1754   08/10/15 1445  ciprofloxacin (CIPRO) IVPB 400 mg     400 mg 200 mL/hr over 60 Minutes Intravenous  Once 08/10/15 1433 08/10/15 1549      Subjective:    [place] she relates she's roughed up, relates that her left arm has diffuse pain. She cannot verbalize or pinpoint where the pain is exactly. But she relates that every time she tries to move it in any direction causes extreme excruciating pain.  Objective:    Filed Vitals:   08/11/15 0315 08/11/15 0317 08/11/15 0712 08/11/15 0813  BP:  131/86 138/94   Pulse:  104 134   Temp: 97.8 F (36.6 C)   98.5 F (36.9 C)  TempSrc: Oral   Oral  Resp:  22 21   Height:      Weight:      SpO2:  97% 93%     Intake/Output Summary (Last 24 hours) at 08/11/15 0821 Last data filed at 08/11/15 0700  Gross per 24 hour  Intake   1495 ml  Output    200 ml  Net   1295 ml   Filed Weights   08/10/15 1707  Weight: 55.3 kg (121 lb 14.6 oz)    Exam: Gen:  NAD Cardiovascular:  RRR, No M/R/G Chest and lungs:   CTAB Abdomen:  Abdomen soft, NT/ND, + BS Extremities:  No C/E/C   Data Reviewed:    Labs: Basic Metabolic Panel:  Recent Labs Lab 08/10/15 1148 08/11/15 0140  NA 141 139  K 3.9 3.4*  CL 109 109  CO2 25 22  GLUCOSE 89 183*  BUN 12 9  CREATININE 1.32* 1.11*  CALCIUM 8.8* 7.6*   GFR Estimated Creatinine Clearance: 36.3 mL/min (by C-G formula based on Cr of 1.11). Liver Function Tests:  Recent Labs Lab 08/10/15 1148 08/11/15 0140  AST 41 39  ALT 27 22  ALKPHOS 182* 122  BILITOT 0.5 0.5  PROT 6.0* 4.4*  ALBUMIN 2.1* 1.5*   No results for input(s): LIPASE, AMYLASE in the last 168 hours. No results for input(s): AMMONIA in the last 168 hours. Coagulation profile  Recent Labs Lab 08/10/15 1726  INR 1.15    CBC:  Recent Labs Lab 08/10/15 1148  08/11/15 0140  WBC 14.6* 10.1  NEUTROABS 9.6*  --   HGB 11.3* 8.7*  HCT 35.7* 27.1*  MCV 86.7 85.0  PLT 331 255   Cardiac Enzymes: No results for input(s): CKTOTAL, CKMB, CKMBINDEX, TROPONINI in the last 168 hours. BNP (last 3 results) No results for input(s): PROBNP in the last 8760 hours. CBG:  Recent Labs Lab 08/10/15 1126 08/10/15 1209 08/10/15 1527 08/10/15 1811 08/10/15 2001  GLUCAP 63* 96 102* 60* 123*   D-Dimer: No results for input(s): DDIMER in the last 72 hours. Hgb A1c: No results for input(s): HGBA1C in the last 72 hours. Lipid Profile: No results for input(s): CHOL, HDL, LDLCALC, TRIG, CHOLHDL, LDLDIRECT in the last 72 hours. Thyroid function studies:  No results for input(s): TSH, T4TOTAL, T3FREE, THYROIDAB in the last 72 hours.  Invalid input(s): FREET3 Anemia work up: No results for input(s): VITAMINB12, FOLATE, FERRITIN, TIBC, IRON, RETICCTPCT in the last 72 hours. Sepsis Labs:  Recent Labs Lab 08/10/15 1148 08/10/15 1647 08/10/15 1726 08/10/15 2130 08/11/15 0140 08/11/15 0152  PROCALCITON  --   --  <0.10  --   --   --   WBC 14.6*  --   --   --  10.1  --   LATICACIDVEN  --  1.5  --  2.4*  --  1.7   Microbiology No results found for this or any previous visit (from the past 240 hour(s)).   Medications:   . allopurinol  100 mg Oral BID  . aspirin EC  81 mg Oral Daily  . cefTAZidime (FORTAZ)  IV  1 g Intravenous Q12H  . cilostazol  50 mg Oral BID AC  . heparin  5,000 Units Subcutaneous 3 times per day  . insulin aspart  0-5 Units Subcutaneous QHS  . insulin aspart  0-9 Units Subcutaneous TID WC  . levofloxacin (LEVAQUIN) IV  750 mg Intravenous Q48H  . magnesium oxide  200 mg Oral Daily  . montelukast  10 mg Oral QHS  . pantoprazole  80 mg Oral Daily  . pneumococcal 23 valent vaccine  0.5 mL Intramuscular Tomorrow-1000  . sodium chloride  3 mL Intravenous Q12H   Continuous Infusions: . sodium chloride 75 mL/hr at 08/11/15 0700     Time spent: 25 min   LOS: 1 day   Charlynne Cousins  [facility] Pager [phone]  *Please refer to [email or web address], password [email or web address] to get updated schedule on who will round on this patient, as hospitalists switch teams weekly. If 7PM-7AM, please contact night-coverage at [email or web address], password [email or web address] for any overnight needs.  08/11/2015, 8:21 AM

## 2015-08-12 ENCOUNTER — Inpatient Hospital Stay (HOSPITAL_COMMUNITY): Payer: Medicare Other

## 2015-08-12 DIAGNOSIS — T68XXXD Hypothermia, subsequent encounter: Secondary | ICD-10-CM

## 2015-08-12 DIAGNOSIS — R52 Pain, unspecified: Secondary | ICD-10-CM

## 2015-08-12 LAB — GLUCOSE, CAPILLARY
GLUCOSE-CAPILLARY: 196 mg/dL — AB (ref 65–99)
GLUCOSE-CAPILLARY: 179 mg/dL — AB (ref 65–99)
Glucose-Capillary: 104 mg/dL — ABNORMAL HIGH (ref 65–99)
Glucose-Capillary: 219 mg/dL — ABNORMAL HIGH (ref 65–99)

## 2015-08-12 LAB — CBC
HEMATOCRIT: 29.2 % — AB (ref 36.0–46.0)
HEMOGLOBIN: 9.6 g/dL — AB (ref 12.0–15.0)
MCH: 28.2 pg (ref 26.0–34.0)
MCHC: 32.9 g/dL (ref 30.0–36.0)
MCV: 85.6 fL (ref 78.0–100.0)
PLATELETS: 246 10*3/uL (ref 150–400)
RBC: 3.41 MIL/uL — AB (ref 3.87–5.11)
RDW: 15.2 % (ref 11.5–15.5)
WBC: 12 10*3/uL — ABNORMAL HIGH (ref 4.0–10.5)

## 2015-08-12 LAB — HEMOGLOBIN A1C
HEMOGLOBIN A1C: 8.6 % — AB (ref 4.8–5.6)
MEAN PLASMA GLUCOSE: 200 mg/dL

## 2015-08-12 LAB — BASIC METABOLIC PANEL
ANION GAP: 7 (ref 5–15)
BUN: 9 mg/dL (ref 6–20)
CALCIUM: 7.8 mg/dL — AB (ref 8.9–10.3)
CHLORIDE: 112 mmol/L — AB (ref 101–111)
CO2: 19 mmol/L — AB (ref 22–32)
Creatinine, Ser: 1.2 mg/dL — ABNORMAL HIGH (ref 0.44–1.00)
GFR calc non Af Amer: 44 mL/min — ABNORMAL LOW (ref >60)
GFR, EST AFRICAN AMERICAN: 51 mL/min — AB (ref >60)
GLUCOSE: 244 mg/dL — AB (ref 65–99)
POTASSIUM: 4.6 mmol/L (ref 3.5–5.1)
Sodium: 138 mmol/L (ref 135–145)

## 2015-08-12 LAB — MAGNESIUM: Magnesium: 1.1 mg/dL — ABNORMAL LOW (ref 1.7–2.4)

## 2015-08-12 MED ORDER — TRAMADOL HCL 50 MG PO TABS: 50.0000 mg | ORAL_TABLET | Freq: Four times a day (QID) | ORAL | Status: DC | PRN

## 2015-08-12 MED ORDER — MORPHINE SULFATE (PF) 2 MG/ML IV SOLN
2.0000 mg | INTRAVENOUS | Status: DC | PRN
  Administered 2015-08-13: 2 mg via INTRAVENOUS
  Filled 2015-08-12: qty 1

## 2015-08-12 MED ORDER — ACETAMINOPHEN 325 MG PO TABS
650.0000 mg | ORAL_TABLET | Freq: Three times a day (TID) | ORAL | Status: DC
  Administered 2015-08-12 – 2015-08-13 (×5): 650 mg via ORAL
  Filled 2015-08-12 (×5): qty 2

## 2015-08-12 NOTE — Progress Notes (Addendum)
PROGRESS NOTE    [name]:[id number] DOB: 09/29/1945 DOA: 08/10/2015 PCP: Philis Fendt, MD  HPI/Brief narrative 69 year old female patient with history of alcoholic cirrhosis, GERD, chronic diastolic CHF, HTN, asthma, COPD, DM, apparently lives alone, ambulates with a cane, states that she was recently discharged home from SNF last Friday after being in SNF for rehabilitation from acute gastroenteritis, presented to ED on 08/10/15 with altered mental status and was found down in the hallway of her home by her caretaker. She was found to be hypoglycemic by EMS in the 60s, hypothermic 93.23F rectally and was admitted to stepdown unit for presumed UTI sepsis.   Assessment/Plan:  Sepsis due to urinary tract infection - Treated per sepsis protocol - Empirically started on IV Fortaz which was deescalated to Rocephin on 12/13. Consider changing to oral Ceftin on 12/15 - Sepsis physiology resolved. Lactate normalized. - Blood cultures 2: Negative to date. Unfortunately no urine seems to have been sent on admission.  Acute kidney injury  - likely prerenal from dehydration and related to sepsis - Resolved after IV fluid hydration. CK normal.  Arthralgia - Indicated several large joints including bilateral shoulders, elbows,? Wrists-left side more than the right. No definitive acute arthritis on exam.? Chronic changes. - Uric acid normal. -X-rays of the left shoulder, elbow and wrist without acute findings. - Venous Dopplers of bilateral upper extremities: Negative for DVT. - Treat supportively with pain medications as needed and follow. Not convinced that she has a gout flare.  Essential hypertension - Controlled. Continue carvedilol which may be helping her tachycardia (possible rebound tachycardia secondary to holding beta blockers)  DM 2 with Hypoglycemia  - Resolved. Continue SSI. Periodic fluctuations.  Acute encephalopathy - Resolved  COPD - Stable  Right eye  ecchymosis - Felt to be due to trauma on admission. CT head and neck without acute findings.  Chronic anemia - Baseline hemoglobin may be in the 8-9 range. Dropped from 11.3-8.7 is probably dilutional. Follow CBC in a.m.     DVT prophylaxis: Subcutaneous heparin  Code Status: Full Family Communication: None at bedside Disposition Plan: Will get PT & OT to evaluate. Possible DC in 1-2 days.   Consultants:  None  Procedures:  None  Antibiotics:  IV Fortaz 12/12 times one dose  IV ciprofloxacin 12/12 times one dose  IV levofloxacin 12/12 times one dose  IV Rocephin 12/13 >   Subjective: Cannot definitely tell where she hurts but seems to be more on her left upper extremity and indicates her forearm.   Objective: Filed Vitals:   08/12/15 0328 08/12/15 0330 08/12/15 0753 08/12/15 1156  BP:  129/81    Pulse:  113    Temp: 97.9 F (36.6 C)  98.6 F (37 C) 98.1 F (36.7 C)  TempSrc: Oral  Oral Oral  Resp:  20    Height:      Weight:      SpO2:  93%      Intake/Output Summary (Last 24 hours) at 08/12/15 1238 Last data filed at 08/12/15 1142  Gross per 24 hour  Intake   2058 ml  Output      0 ml  Net   2058 ml   Filed Weights   08/10/15 1707  Weight: 55.3 kg (121 lb 14.6 oz)     Exam:  General exam: Pleasant elderly female sitting up comfortably in bed.  Respiratory system: Clear. No increased work of breathing. Cardiovascular system: S1 & S2 heard, RRR. No JVD, murmurs,  gallops, clicks or pedal edema.Telemetry: Sinus tachycardia in the 100s-120s.  Gastrointestinal system: Abdomen is nondistended, soft and nontender. Normal bowel sounds heard. Central nervous system: Alert and oriented. No focal neurological deficits. Extremities: Symmetric 5 x 5 power.Boggy swelling of bilateral dorsum of hand and fingers, left >right and no acute arthritic findings on exam of bilateral wrist, elbows or shoulders.   Data Reviewed: Basic Metabolic Panel:  Recent  Labs Lab 08/10/15 1148 08/11/15 0140  NA 141 139  K 3.9 3.4*  CL 109 109  CO2 25 22  GLUCOSE 89 183*  BUN 12 9  CREATININE 1.32* 1.11*  CALCIUM 8.8* 7.6*   Liver Function Tests:  Recent Labs Lab 08/10/15 1148 08/11/15 0140  AST 41 39  ALT 27 22  ALKPHOS 182* 122  BILITOT 0.5 0.5  PROT 6.0* 4.4*  ALBUMIN 2.1* 1.5*   No results for input(s): LIPASE, AMYLASE in the last 168 hours. No results for input(s): AMMONIA in the last 168 hours. CBC:  Recent Labs Lab 08/10/15 1148 08/11/15 0140  WBC 14.6* 10.1  NEUTROABS 9.6*  --   HGB 11.3* 8.7*  HCT 35.7* 27.1*  MCV 86.7 85.0  PLT 331 255   Cardiac Enzymes:  Recent Labs Lab 08/11/15 0945  CKTOTAL 167   BNP (last 3 results) No results for input(s): PROBNP in the last 8760 hours. CBG:  Recent Labs Lab 08/11/15 0816 08/11/15 1111 08/11/15 1657 08/11/15 2109 08/12/15 0751  GLUCAP 139* 85 233* 109* 104*    Recent Results (from the past 240 hour(s))  Culture, blood (x 2)     Status: None (Preliminary result)   Collection Time: 08/10/15  7:30 PM  Result Value Ref Range Status   Specimen Description BLOOD RIGHT ANTECUBITAL  Final   Special Requests IN PEDIATRIC BOTTLE 2CC  Final   Culture NO GROWTH 2 DAYS  Final   Report Status PENDING  Incomplete  Culture, blood (x 2)     Status: None (Preliminary result)   Collection Time: 08/10/15  7:40 PM  Result Value Ref Range Status   Specimen Description BLOOD RIGHT HAND  Final   Special Requests IN PEDIATRIC BOTTLE 2CC  Final   Culture NO GROWTH 2 DAYS  Final   Report Status PENDING  Incomplete           Studies: Dg Chest 2 View  08/10/2015  CLINICAL DATA:  Fall.  Hypoglycemia. EXAM: CHEST  2 VIEW COMPARISON:  06/15/2014 FINDINGS: Minimal bibasilar opacities, likely atelectasis or scarring. Heart is normal size. No effusions. No acute bony abnormality. Degenerative changes in the shoulders and thoracic spine. Multiple compression fractures in the mid  thoracic spine, likely chronic. IMPRESSION: Bibasilar scarring or atelectasis. Electronically Signed   By: Rolm Baptise M.D.   On: 08/10/2015 13:22   Dg Elbow 2 Views Left  08/11/2015  CLINICAL DATA:  Arm pain, diffuse, left. Pt c/o pain at her left shoulder, elbow and wrist over the last few days. Pt states she fell roughly 1 month ago but that she fell on her right side. No known injuries to the left upper extremity. History of gout. EXAM: LEFT ELBOW - 2 VIEW COMPARISON:  None. FINDINGS: No fracture or dislocation. No significant arthropathic change. No joint effusion. No bone lesion. Bones are demineralized. Soft tissues are unremarkable. IMPRESSION: 1. No fracture or dislocation. 2. No significant arthropathic change. Electronically Signed   By: Lajean Manes M.D.   On: 08/11/2015 12:27   Dg Wrist Complete Left  08/11/2015  CLINICAL DATA:  Arm pain, diffuse, left. Pt c/o pain at her left shoulder, elbow and wrist over the last few days. Pt states she fell roughly 1 month ago but that she fell on her right side. No known injuries to the left upper extremity. History of gout. EXAM: LEFT WRIST - COMPLETE 3+ VIEW COMPARISON:  None. FINDINGS: No fracture.  No dislocation. Bones are diffusely demineralized. Joint spaces are well maintained. There is a small marginal erosion from the distal aspect of the scaphoid. Subtle subchondral erosions are noted along the proximal aspect of the lunate. There is calcification along the triangle fibrocartilage complex. Soft tissues are unremarkable. IMPRESSION: 1. No fracture or dislocation. 2. Relatively subtle changes of an erosive arthropathy. This could be due to gout or other inflammatory arthropathy. Electronically Signed   By: Lajean Manes M.D.   On: 08/11/2015 12:26   Ct Head Wo Contrast  08/10/2015  CLINICAL DATA:  Status post fall. EXAM: CT HEAD WITHOUT CONTRAST CT CERVICAL SPINE WITHOUT CONTRAST TECHNIQUE: Multidetector CT imaging of the head and cervical  spine was performed following the standard protocol without intravenous contrast. Multiplanar CT image reconstructions of the cervical spine were also generated. COMPARISON:  06/14/2014 FINDINGS: CT HEAD FINDINGS There is no evidence of mass effect, midline shift, or extra-axial fluid collections. There is no evidence of a space-occupying lesion or intracranial hemorrhage. There is no evidence of a cortical-based area of acute infarction. There is generalized cerebral atrophy. There is periventricular white matter low attenuation likely secondary to microangiopathy. The ventricles and sulci are appropriate for the patient's age. The basal cisterns are patent. Visualized portions of the orbits are unremarkable. There is chronic right maxillary sinus disease partially visualized. Cerebrovascular atherosclerotic calcifications are noted. The osseous structures are unremarkable. CT CERVICAL SPINE FINDINGS The alignment is anatomic. The vertebral body heights are maintained. There is no acute fracture. 4 mm of anterolisthesis of C3 on C4 secondary to severe left facet disease. The prevertebral soft tissues are normal. The intraspinal soft tissues are not fully imaged on this examination due to poor soft tissue contrast, but there is no gross soft tissue abnormality. Degenerative disc disease at C3-4, C4-5, C5-6 and C6-7. Broad-based disc osteophyte complexes at C4-5, C5-6 and C6-7. Mild broad-based disc bulge at C7-T1. Moderate left facet arthropathy at C3-4 with mild erosive changes. Mild left facet arthropathy at C4-5. Bilateral uncovertebral degenerative changes at C5-6 with left foraminal narrowing. Right uncovertebral degenerative change at C6-7 with right foraminal narrowing. There is osseous fusion of the right C4-5 posterior elements. The visualized portions of the lung apices demonstrate no focal abnormality. There is bilateral carotid artery atherosclerosis. IMPRESSION: 1. No acute intracranial pathology. 2. No  acute osseous injury of the cervical spine. Electronically Signed   By: Kathreen Devoid   On: 08/10/2015 13:12   Ct Cervical Spine Wo Contrast  08/10/2015  CLINICAL DATA:  Status post fall. EXAM: CT HEAD WITHOUT CONTRAST CT CERVICAL SPINE WITHOUT CONTRAST TECHNIQUE: Multidetector CT imaging of the head and cervical spine was performed following the standard protocol without intravenous contrast. Multiplanar CT image reconstructions of the cervical spine were also generated. COMPARISON:  06/14/2014 FINDINGS: CT HEAD FINDINGS There is no evidence of mass effect, midline shift, or extra-axial fluid collections. There is no evidence of a space-occupying lesion or intracranial hemorrhage. There is no evidence of a cortical-based area of acute infarction. There is generalized cerebral atrophy. There is periventricular white matter low attenuation likely secondary to microangiopathy.  The ventricles and sulci are appropriate for the patient's age. The basal cisterns are patent. Visualized portions of the orbits are unremarkable. There is chronic right maxillary sinus disease partially visualized. Cerebrovascular atherosclerotic calcifications are noted. The osseous structures are unremarkable. CT CERVICAL SPINE FINDINGS The alignment is anatomic. The vertebral body heights are maintained. There is no acute fracture. 4 mm of anterolisthesis of C3 on C4 secondary to severe left facet disease. The prevertebral soft tissues are normal. The intraspinal soft tissues are not fully imaged on this examination due to poor soft tissue contrast, but there is no gross soft tissue abnormality. Degenerative disc disease at C3-4, C4-5, C5-6 and C6-7. Broad-based disc osteophyte complexes at C4-5, C5-6 and C6-7. Mild broad-based disc bulge at C7-T1. Moderate left facet arthropathy at C3-4 with mild erosive changes. Mild left facet arthropathy at C4-5. Bilateral uncovertebral degenerative changes at C5-6 with left foraminal narrowing. Right  uncovertebral degenerative change at C6-7 with right foraminal narrowing. There is osseous fusion of the right C4-5 posterior elements. The visualized portions of the lung apices demonstrate no focal abnormality. There is bilateral carotid artery atherosclerosis. IMPRESSION: 1. No acute intracranial pathology. 2. No acute osseous injury of the cervical spine. Electronically Signed   By: Kathreen Devoid   On: 08/10/2015 13:12   Dg Shoulder Left  08/11/2015  CLINICAL DATA:  Arm pain, diffuse, left. Pt c/o pain at her left shoulder, elbow and wrist over the last few days. Pt states she fell roughly 1 month ago but that she fell on her right side. No known injuries to the left upper extremity. History of gout. EXAM: LEFT SHOULDER - 2+ VIEW COMPARISON:  None. FINDINGS: No fracture or dislocation. There is subchondral sclerosis and cystic change along the articular surface of the humeral head and adjacent glenoid. Small marginal osteophytes project from the inferior aspect of the glenoid and humeral head. The [facility] joint is normally spaced and aligned with no significant arthropathic change. Bones are demineralized. There is narrowing of the subacromial space to approximately 4.5 mm. This suggests a chronic full-thickness rotator cuff tear. Soft tissues are unremarkable. IMPRESSION: 1. No fracture or dislocation. 2. Arthropathic changes of the glenohumeral joint. These are nonspecific. It could be due to gout or other inflammatory arthritis. 3. Mild narrowing of the subacromial space. Consider a chronic full-thickness rotator cuff tear in the proper clinical setting, which would be best evaluated with MRI. Electronically Signed   By: Lajean Manes M.D.   On: 08/11/2015 12:24        Scheduled Meds: . acetaminophen  650 mg Oral TID  . allopurinol  100 mg Oral BID  . aspirin EC  81 mg Oral Daily  . carvedilol  6.25 mg Oral BID WC  . cefTRIAXone (ROCEPHIN)  IV  1 g Intravenous Q24H  . cilostazol  50 mg Oral BID AC    . heparin  5,000 Units Subcutaneous 3 times per day  . insulin aspart  0-5 Units Subcutaneous QHS  . insulin aspart  0-9 Units Subcutaneous TID WC  . magnesium oxide  200 mg Oral Daily  . montelukast  10 mg Oral QHS  . pantoprazole  80 mg Oral Daily  . simvastatin  10 mg Oral Daily  . sodium chloride  3 mL Intravenous Q12H  . [START ON 08/17/2015] Vitamin D (Ergocalciferol)  50,000 Units Oral Q Mon   Continuous Infusions:    Principal Problem:   Sepsis ([place]) Active Problems:   COPD (chronic obstructive pulmonary  disease) ([place])   PVD, LEIA/LIIA and RCIA pta 7/09- ABIs 0.68 and 0.69 Feb 2014   Acute encephalopathy   Essential hypertension   Gout   Hypoglycemia   Hypothermia   UTI (lower urinary tract infection)    Time spent: 30 minutes   HONGALGI,ANAND, MD, FACP, FHM. [facility] Pager [phone]  If 7PM-7AM, please contact night-coverage [email or web address] Password [email or web address] 08/12/2015, 12:38 PM    LOS: 2 days

## 2015-08-12 NOTE — Progress Notes (Addendum)
*  PRELIMINARY RESULTS* Vascular Ultrasound Upper extremity venous duplex has been completed.  Preliminary findings: Negative for DVT bilaterally. Incidentally, there is a cystic structure noted in the left anterior axilla. Etiology unknown.   Landry Mellow, RDMS, RVT  08/12/2015, 10:44 AM

## 2015-08-13 DIAGNOSIS — N3000 Acute cystitis without hematuria: Secondary | ICD-10-CM

## 2015-08-13 DIAGNOSIS — R000 Tachycardia, unspecified: Secondary | ICD-10-CM

## 2015-08-13 LAB — MAGNESIUM: MAGNESIUM: 1.3 mg/dL — AB (ref 1.7–2.4)

## 2015-08-13 LAB — GLUCOSE, CAPILLARY
GLUCOSE-CAPILLARY: 79 mg/dL (ref 65–99)
GLUCOSE-CAPILLARY: 94 mg/dL (ref 65–99)
GLUCOSE-CAPILLARY: 93 mg/dL (ref 65–99)
GLUCOSE-CAPILLARY: 114 mg/dL — AB (ref 65–99)
Glucose-Capillary: 110 mg/dL — ABNORMAL HIGH (ref 65–99)

## 2015-08-13 LAB — BASIC METABOLIC PANEL
ANION GAP: 12 (ref 5–15)
BUN: 9 mg/dL (ref 6–20)
CHLORIDE: 109 mmol/L (ref 101–111)
CO2: 21 mmol/L — AB (ref 22–32)
CREATININE: 1.2 mg/dL — AB (ref 0.44–1.00)
Calcium: 9 mg/dL (ref 8.9–10.3)
GFR calc non Af Amer: 44 mL/min — ABNORMAL LOW (ref >60)
GFR, EST AFRICAN AMERICAN: 51 mL/min — AB (ref >60)
Glucose, Bld: 104 mg/dL — ABNORMAL HIGH (ref 65–99)
Potassium: 3.9 mmol/L (ref 3.5–5.1)
SODIUM: 142 mmol/L (ref 135–145)

## 2015-08-13 LAB — TSH: TSH: 1.08 u[IU]/mL (ref 0.350–4.500)

## 2015-08-13 MED ORDER — MAGNESIUM OXIDE 400 (241.3 MG) MG PO TABS
400.0000 mg | ORAL_TABLET | Freq: Every day | ORAL | Status: DC
  Filled 2015-08-13: qty 1

## 2015-08-13 MED ORDER — CEFUROXIME AXETIL 500 MG PO TABS
250.0000 mg | ORAL_TABLET | Freq: Two times a day (BID) | ORAL | Status: DC
  Administered 2015-08-13 – 2015-08-14 (×3): 250 mg via ORAL
  Filled 2015-08-13 (×3): qty 1

## 2015-08-13 MED ORDER — FERROUS SULFATE 325 (65 FE) MG PO TABS
325.0000 mg | ORAL_TABLET | Freq: Every day | ORAL | Status: DC
  Administered 2015-08-14: 325 mg via ORAL
  Filled 2015-08-13: qty 1

## 2015-08-13 MED ORDER — CLONIDINE HCL 0.1 MG PO TABS
0.1000 mg | ORAL_TABLET | Freq: Three times a day (TID) | ORAL | Status: DC
  Administered 2015-08-13 – 2015-08-14 (×3): 0.1 mg via ORAL
  Filled 2015-08-13 (×3): qty 1

## 2015-08-13 MED ORDER — PROMETHAZINE HCL 25 MG/ML IJ SOLN
12.5000 mg | Freq: Four times a day (QID) | INTRAMUSCULAR | Status: DC | PRN
  Administered 2015-08-14: 12.5 mg via INTRAVENOUS
  Filled 2015-08-13: qty 1

## 2015-08-13 MED ORDER — CARVEDILOL 12.5 MG PO TABS
12.5000 mg | ORAL_TABLET | Freq: Two times a day (BID) | ORAL | Status: DC
  Filled 2015-08-13: qty 1

## 2015-08-13 NOTE — Care Management Note (Signed)
Case Management Note  Patient Details  Name: [name] MRN: [id number] Date of Birth: 17-Dec-1945  Subjective/Objective:                 Admitted with AMS, was recently discharged home from SNF last Friday after being in SNF for rehabilitation from acute gastroenteritis. History of alcoholic cirrhosis, GERD, chronic diastolic CHF, HTN, asthma, COPD, DM. Lives alone. Independent with ADL's. Pt uses a personal care service  M-F 1.5 hrs to help with house chores but can't remember name of agency. DME: rolling walker, cane, tub bench. Pt states would like a 3 in 1.  Action/Plan:  PT evaluation pending..... CM to  f/u with disposition needs.  Expected Discharge Date:                  Expected Discharge Plan:  [place]  In-House Referral:     Discharge planning Services  CM Consult  Post Acute Care Choice:    Choice offered to:     DME Arranged:    DME Agency:     HH Arranged:    [place]:  [facility] (has used in the past, would like to use [place] if  hh services are needed @ d/c.  Status of Service:  In process, will continue to follow  Medicare Important Message Given:    Date Medicare IM Given:    Medicare IM give by:    Date Additional Medicare IM Given:    Additional Medicare Important Message give by:     If discussed at [place] of Stay Meetings, dates discussed:    Additional Comments: Maree Erie (Relative) [phone], Stormy Card [facility])  [phone]  Whitman Hero [place], [place] [phone] 08/13/2015, 9:51 AM

## 2015-08-13 NOTE — Care Management Important Message (Signed)
Important Message  Patient Details  Name: [name] MRN: [id number] Date of Birth: 1945/11/23   Medicare Important Message Given:  Yes    Louanne Belton 08/13/2015, 2:40 [place] Message  Patient Details  Name: [name] MRN: [id number] Date of Birth: December 05, 1945   Medicare Important Message Given:  Yes    Arlanda Shiplett G 08/13/2015, 2:40 PM

## 2015-08-13 NOTE — Progress Notes (Signed)
Patient to transfer to 5W03 attempted to give report to receiving nurse.  Nurse is unavailable awaiting call back.

## 2015-08-13 NOTE — Progress Notes (Signed)
PT Cancellation Note  Patient Details Name: [name] MRN: [id number] DOB: 26-Aug-1946   Cancelled Treatment:    Reason Eval/Treat Not Completed: Medical issues which prohibited therapy.  Pt just worked with OT and became nauseated with HR up to 169 during minimal activity.  Will hold PT eval at this time and f/u as appropriate.     Kensi Karr, Thornton Papas 08/13/2015, 1:50 PM

## 2015-08-13 NOTE — Progress Notes (Signed)
PROGRESS NOTE    [name] IOM:[id number] DOB: 04-15-1946 DOA: 08/10/2015 PCP: Philis Fendt, MD  HPI/Brief narrative 69 year old female patient with history of alcoholic cirrhosis, GERD, chronic diastolic CHF, HTN, asthma, COPD, DM, apparently lives alone, ambulates with a cane, states that she was recently discharged home from SNF last Friday after being in SNF for rehabilitation from acute gastroenteritis, presented to ED on 08/10/15 with altered mental status and was found down in the hallway of her home by her caretaker. She was found to be hypoglycemic by EMS in the 60s, hypothermic 93.13F rectally and was admitted to stepdown unit for presumed UTI sepsis.   Assessment/Plan:  Sepsis due to urinary tract infection - Treated per sepsis protocol - Empirically started on IV Fortaz which was deescalated to Rocephin on 12/13. Changed to oral Ceftin on 12/15 - Sepsis physiology resolved. Lactate normalized. - Blood cultures 2: Negative to date. Unfortunately no urine seems to have been sent on admission.  Acute kidney injury  - likely prerenal from dehydration and related to sepsis - Resolved after IV fluid hydration. CK normal.  Arthralgia - On 12/14, indicated pain in several large joints including bilateral shoulders, elbows,? Wrists-left side more than the right. No definitive acute arthritis on exam.? Chronic changes. - Uric acid normal. - X-rays of the left shoulder, elbow and wrist without acute findings. - Venous Dopplers of bilateral upper extremities: Negative for DVT. - Treat supportively with pain medications as needed and follow. Not convinced that she has a gout flare. - Pain controlled/seems to have even resolved. Able to move above joints well without painful distress.  Essential hypertension - Uncontrolled. Patient's home medications could not be verified until the afternoon of 12/15. Following that she was resumed on her home medications including  carvedilol 12.5 MG twice a day and clonidine 0.1 MG 3 times a day. - She has been persistently tachycardic in the hospital in the 100s-110s and with activity sometimes higher. This is likely secondary to rebound from being off of clonidine and on reduced dose of carvedilol. Now that she has been placed back on her home dose, monitor closely and expect improvement. Asymptomatic of tachycardia.  DM 2 with Hypoglycemia  - Resolved. Continue SSI. Periodic fluctuations.  Acute encephalopathy - Resolved  COPD - Stable  Right eye ecchymosis - Felt to be due to trauma on admission. CT head and neck without acute findings. Improved.  Chronic anemia - Baseline hemoglobin may be in the 8-9 range. Dropped from 11.3-8.7 is probably dilutional. Stable.  Hypomagnesemia - Will repeat BMP and magnesium stat and replace as needed.     DVT prophylaxis: Subcutaneous heparin  Code Status: Full Family Communication: None at bedside Disposition Plan: Await PT follow-up in a.m. OT recommends home health OT with 24-hour supervision-not sure if she has this at home. Transfer to telemetry.   Consultants:  None  Procedures:  None  Antibiotics:  IV Fortaz 12/12 times one dose  IV ciprofloxacin 12/12 times one dose  IV levofloxacin 12/12 times one dose  IV Rocephin 12/13 > 12/14  Oral Ceftin 12/15 >  Subjective: Denies complaints. Denies pain in her upper extremity joints.  Objective: Filed Vitals:   08/13/15 1219 08/13/15 1313 08/13/15 1459 08/13/15 1637  BP:  127/81 139/88   Pulse: 126   93  Temp: 98.3 F (36.8 C)  98.5 F (36.9 C)   TempSrc: Oral  Oral   Resp: '18 23 9 16  '$ Height:  Weight:      SpO2: 95%   100%    Intake/Output Summary (Last 24 hours) at 08/13/15 1717 Last data filed at 08/13/15 0345  Gross per 24 hour  Intake    723 ml  Output      0 ml  Net    723 ml   Filed Weights   08/10/15 1707  Weight: 55.3 kg (121 lb 14.6 oz)     Exam:  General exam:  Pleasant elderly female sitting up comfortably in bed.  Respiratory system: Clear. No increased work of breathing. Cardiovascular system: S1 & S2 heard, RRR. No JVD, murmurs, gallops, clicks or pedal edema.Telemetry: Sinus tachycardia in the 100s-110s. Increase to 160s while PT were attempting to evaluate. Gastrointestinal system: Abdomen is nondistended, soft and nontender. Normal bowel sounds heard. Central nervous system: Alert and oriented. No focal neurological deficits. Extremities: Symmetric 5 x 5 power.Boggy swelling of bilateral dorsum of hand and fingers, left >right and no acute arthritic findings on exam of bilateral wrist, elbows or shoulders-this seems more from fluid retention. Able to move both upper extremities without pain or discomfort.   Data Reviewed: Basic Metabolic Panel:  Recent Labs Lab 08/10/15 1148 08/11/15 0140 08/12/15 1316  NA 141 139 138  K 3.9 3.4* 4.6  CL 109 109 112*  CO2 25 22 19*  GLUCOSE 89 183* 244*  BUN '12 9 9  '$ CREATININE 1.32* 1.11* 1.20*  CALCIUM 8.8* 7.6* 7.8*  MG  --   --  1.1*   Liver Function Tests:  Recent Labs Lab 08/10/15 1148 08/11/15 0140  AST 41 39  ALT 27 22  ALKPHOS 182* 122  BILITOT 0.5 0.5  PROT 6.0* 4.4*  ALBUMIN 2.1* 1.5*   No results for input(s): LIPASE, AMYLASE in the last 168 hours. No results for input(s): AMMONIA in the last 168 hours. CBC:  Recent Labs Lab 08/10/15 1148 08/11/15 0140 08/12/15 1316  WBC 14.6* 10.1 12.0*  NEUTROABS 9.6*  --   --   HGB 11.3* 8.7* 9.6*  HCT 35.7* 27.1* 29.2*  MCV 86.7 85.0 85.6  PLT 331 255 246   Cardiac Enzymes:  Recent Labs Lab 08/11/15 0945  CKTOTAL 167   BNP (last 3 results) No results for input(s): PROBNP in the last 8760 hours. CBG:  Recent Labs Lab 08/12/15 1536 08/12/15 2130 08/13/15 0831 08/13/15 1208 08/13/15 1457  GLUCAP 179* 219* 79 110* 94    Recent Results (from the past 240 hour(s))  Culture, blood (x 2)     Status: None (Preliminary  result)   Collection Time: 08/10/15  7:30 PM  Result Value Ref Range Status   Specimen Description BLOOD RIGHT ANTECUBITAL  Final   Special Requests IN PEDIATRIC BOTTLE 2CC  Final   Culture NO GROWTH 3 DAYS  Final   Report Status PENDING  Incomplete  Culture, blood (x 2)     Status: None (Preliminary result)   Collection Time: 08/10/15  7:40 PM  Result Value Ref Range Status   Specimen Description BLOOD RIGHT HAND  Final   Special Requests IN PEDIATRIC BOTTLE 2CC  Final   Culture NO GROWTH 3 DAYS  Final   Report Status PENDING  Incomplete           Studies: No results found.      Scheduled Meds: . acetaminophen  650 mg Oral TID  . allopurinol  100 mg Oral BID  . aspirin EC  81 mg Oral Daily  . [START ON 08/14/2015]  carvedilol  12.5 mg Oral BID WC  . cefUROXime  250 mg Oral BID WC  . cilostazol  50 mg Oral BID AC  . cloNIDine  0.1 mg Oral TID  . [START ON 08/14/2015] ferrous sulfate  325 mg Oral Q breakfast  . heparin  5,000 Units Subcutaneous 3 times per day  . insulin aspart  0-5 Units Subcutaneous QHS  . insulin aspart  0-9 Units Subcutaneous TID WC  . [START ON 08/14/2015] magnesium oxide  400 mg Oral Daily  . montelukast  10 mg Oral QHS  . pantoprazole  80 mg Oral Daily  . simvastatin  10 mg Oral Daily  . sodium chloride  3 mL Intravenous Q12H  . [START ON 08/17/2015] Vitamin D (Ergocalciferol)  50,000 Units Oral Q Mon   Continuous Infusions:    Principal Problem:   Sepsis ([place]) Active Problems:   COPD (chronic obstructive pulmonary disease) (HCC)   PVD, LEIA/LIIA and RCIA pta 7/09- ABIs 0.68 and 0.69 Feb 2014   Acute encephalopathy   Essential hypertension   Gout   Hypoglycemia   Hypothermia   UTI (lower urinary tract infection)    Time spent: 30 minutes   Aziah Brostrom, MD, FACP, FHM. [facility] Pager [phone]  If 7PM-7AM, please contact night-coverage [email or web address] Password [email or web address] 08/13/2015, 5:17 PM    LOS: 3 days

## 2015-08-13 NOTE — Progress Notes (Signed)
Pt. BP increased in the 160's/110's with a heart rate sustaining in the 120's to 130's.  Scheduled beta blocker given followed up patients BP 140/110 with HR 120's.  Patient is asymptomatic at this time resting comfortably in the bed with no complaints of pain or discomfort.  Notified Dr. Algis Liming updated MD on patients condition, received no new orders at this time.  Will continue to monitor pt.

## 2015-08-13 NOTE — Evaluation (Signed)
Occupational Therapy Evaluation Patient Details Name: ANEESAH HERNAN MRN: [id number] DOB: March 31, 1946 Today's Date: 08/13/2015    History of Present Illness This 69 y.o. female admitted with AMS after being found down in the hallway of her home by caregiver.  Dx:  sepsis due to UTI, AKI, acute encephalopathy.  PMH includes:  HTN, DM , COPD, chronic anemia, alcoholic cirrhosis, GERD, chronic diastolic CHF, asthma    Clinical Impression   Pt admitted with above. She demonstrates the below listed deficits and will benefit from continued OT to maximize safety and independence with BADLs.  Pt presents to OT with generalized weakness and decreased activity tolerance.   HR to 169 with ambulation into the BR and sustained 130s - 140s.   She requires min A for ADLs, but anticipate she will progress quickly to supervision - mod I level.       Follow Up Recommendations  Home health OT;Supervision/Assistance - 24 hour    Equipment Recommendations  3 in 1 bedside comode    Recommendations for Other Services       Precautions / Restrictions Precautions Precautions: Fall      Mobility Bed Mobility Overal bed mobility: Independent                Transfers Overall transfer level: Needs assistance   Transfers: Sit to/from Stand;Stand Pivot Transfers Sit to Stand: Min guard Stand pivot transfers: Min guard            Balance Overall balance assessment: Needs assistance Sitting-balance support: Feet supported Sitting balance-Leahy Scale: Good       Standing balance-Leahy Scale: Poor Standing balance comment: requires UE support                             ADL Overall ADL's : Needs assistance/impaired Eating/Feeding: Independent   Grooming: Wash/dry hands;Wash/dry face;Min guard;Standing   Upper Body Bathing: Sitting;Set up   Lower Body Bathing: Minimal assistance;Sit to/from stand   Upper Body Dressing : Minimal assistance;Sitting   Lower Body  Dressing: Minimal assistance;Sit to/from stand   Toilet Transfer: Minimal assistance;Ambulation;Comfort height toilet;Grab bars Toilet Transfer Details (indicate cue type and reason): min A to steady  Toileting- Water quality scientist and Hygiene: Min guard;Sit to/from stand       Functional mobility during ADLs: Minimal assistance General ADL Comments: Pt with n/v and elevated HR which limits pt ability to perform ADLs      Vision     Perception     Praxis      Pertinent Vitals/Pain       Hand Dominance Right   Extremity/Trunk Assessment Upper Extremity Assessment Upper Extremity Assessment: Overall WFL for tasks assessed   Lower Extremity Assessment Lower Extremity Assessment: Defer to PT evaluation   Cervical / Trunk Assessment Cervical / Trunk Assessment: Normal   Communication Communication Communication: No difficulties   Cognition Arousal/Alertness: Awake/alert Behavior During Therapy: WFL for tasks assessed/performed Overall Cognitive Status: Within Functional Limits for tasks assessed                     General Comments       Exercises       Shoulder Instructions      Home Living Family/patient expects to be discharged to:: Private residence Living Arrangements: Alone Available Help at Discharge: Personal care attendant Type of Home: Apartment Home Access: Level entry     Home Layout: One level  Bathroom Shower/Tub: Risk analyst characteristics: Architectural technologist: Standard     Home Equipment: Environmental consultant - 2 wheels;Cane - single point;Walker - 4 wheels;Bedside commode;Shower seat;Cane - quad          Prior Functioning/Environment Level of Independence: Needs assistance  Gait / Transfers Assistance Needed: ambulates mod I with SPC ADL's / Homemaking Assistance Needed: Pt reports she was performing ADLs at SNF, but hadn not been home long enough to determine how she was functioning at home    Comments: Pt  reports she has an aide for 1.5 hours/day     OT Diagnosis: Generalized weakness   OT Problem List: Decreased strength;Decreased activity tolerance;Impaired balance (sitting and/or standing);Decreased safety awareness;Decreased knowledge of use of DME or AE;Cardiopulmonary status limiting activity   OT Treatment/Interventions: Self-care/ADL training;DME and/or AE instruction;Therapeutic activities;Patient/family education;Balance training    OT Goals(Current goals can be found in the care plan section) Acute Rehab OT Goals Patient Stated Goal: to feel better  OT Goal Formulation: With patient Time For Goal Achievement: 08/27/15 Potential to Achieve Goals: Good ADL Goals Pt Will Perform Grooming: with supervision;standing Pt Will Perform Upper Body Bathing: with set-up;sitting Pt Will Perform Lower Body Bathing: with supervision;sit to/from stand Pt Will Perform Upper Body Dressing: with set-up;sitting Pt Will Perform Lower Body Dressing: with supervision;sit to/from stand Pt Will Transfer to Toilet: with supervision;ambulating;regular height toilet;grab bars Pt Will Perform Toileting - Clothing Manipulation and hygiene: with supervision;sit to/from stand  OT Frequency: Min 2X/week   Barriers to D/C: Decreased caregiver support          Co-evaluation              End of Session Nurse Communication: Mobility status  Activity Tolerance: Patient limited by fatigue;Other (comment) (elevated HR ) Patient left: in bed;with call bell/phone within reach   Time: 1251-1305 OT Time Calculation (min): 14 min Charges:  OT General Charges $OT Visit: 1 Procedure OT Evaluation $Initial OT Evaluation Tier I: 1 Procedure G-Codes:    Mariaeduarda Defranco, Ellard Artis M 2015-08-14, 1:22 PM

## 2015-08-13 NOTE — Progress Notes (Signed)
Patients to transfer to [place] report given to receiving nurse, all questions answered at this time.  Pt. VSS with no s/s of distress noted.  Patient stable at transfer.

## 2015-08-14 ENCOUNTER — Inpatient Hospital Stay (HOSPITAL_COMMUNITY): Payer: Medicare Other

## 2015-08-14 DIAGNOSIS — I639 Cerebral infarction, unspecified: Secondary | ICD-10-CM

## 2015-08-14 DIAGNOSIS — A419 Sepsis, unspecified organism: Principal | ICD-10-CM

## 2015-08-14 DIAGNOSIS — G9340 Encephalopathy, unspecified: Secondary | ICD-10-CM

## 2015-08-14 DIAGNOSIS — J690 Pneumonitis due to inhalation of food and vomit: Secondary | ICD-10-CM

## 2015-08-14 DIAGNOSIS — R112 Nausea with vomiting, unspecified: Secondary | ICD-10-CM

## 2015-08-14 LAB — GLUCOSE, CAPILLARY
GLUCOSE-CAPILLARY: 149 mg/dL — AB (ref 65–99)
Glucose-Capillary: 155 mg/dL — ABNORMAL HIGH (ref 65–99)
Glucose-Capillary: 114 mg/dL — ABNORMAL HIGH (ref 65–99)
Glucose-Capillary: 120 mg/dL — ABNORMAL HIGH (ref 65–99)

## 2015-08-14 MED ORDER — PROMETHAZINE HCL 25 MG/ML IJ SOLN
12.5000 mg | INTRAMUSCULAR | Status: DC | PRN
Start: 2015-08-14 — End: 2015-08-14

## 2015-08-14 MED ORDER — CETYLPYRIDINIUM CHLORIDE 0.05 % MT LIQD
7.0000 mL | Freq: Two times a day (BID) | OROMUCOSAL | Status: DC
  Administered 2015-08-14 – 2015-08-18 (×4): 7 mL via OROMUCOSAL

## 2015-08-14 MED ORDER — LEVOFLOXACIN IN D5W 750 MG/150ML IV SOLN
750.0000 mg | INTRAVENOUS | Status: DC
  Administered 2015-08-16: 750 mg via INTRAVENOUS
  Filled 2015-08-14: qty 150

## 2015-08-14 MED ORDER — FOLIC ACID 5 MG/ML IJ SOLN
1.0000 mg | Freq: Every day | INTRAMUSCULAR | Status: DC
  Administered 2015-08-14: 1 mg via INTRAVENOUS
  Filled 2015-08-14 (×2): qty 0.2

## 2015-08-14 MED ORDER — PROCHLORPERAZINE EDISYLATE 5 MG/ML IJ SOLN
10.0000 mg | Freq: Four times a day (QID) | INTRAMUSCULAR | Status: DC | PRN
  Administered 2015-08-14: 10 mg via INTRAVENOUS
  Filled 2015-08-14 (×2): qty 2

## 2015-08-14 MED ORDER — ASPIRIN 325 MG PO TABS
325.0000 mg | ORAL_TABLET | Freq: Every day | ORAL | Status: DC
  Administered 2015-08-14 – 2015-08-18 (×5): 325 mg via ORAL
  Filled 2015-08-14 (×5): qty 1

## 2015-08-14 MED ORDER — MAGNESIUM SULFATE 2 GM/50ML IV SOLN
2.0000 g | Freq: Once | INTRAVENOUS | Status: AC
  Administered 2015-08-14: 2 g via INTRAVENOUS
  Filled 2015-08-14: qty 50

## 2015-08-14 MED ORDER — THIAMINE HCL 100 MG/ML IJ SOLN
100.0000 mg | Freq: Every day | INTRAMUSCULAR | Status: DC
  Administered 2015-08-14: 100 mg via INTRAVENOUS
  Filled 2015-08-14: qty 2

## 2015-08-14 MED ORDER — FENTANYL CITRATE (PF) 100 MCG/2ML IJ SOLN
INTRAMUSCULAR | Status: AC
  Filled 2015-08-14: qty 4

## 2015-08-14 MED ORDER — ACETAMINOPHEN 650 MG RE SUPP: 650.0000 mg | RECTAL | Status: DC | PRN

## 2015-08-14 MED ORDER — ONDANSETRON HCL 4 MG/2ML IJ SOLN
4.0000 mg | Freq: Four times a day (QID) | INTRAMUSCULAR | Status: DC | PRN
  Administered 2015-08-14 – 2015-08-16 (×3): 4 mg via INTRAVENOUS
  Filled 2015-08-14 (×3): qty 2

## 2015-08-14 MED ORDER — CLONIDINE HCL 0.1 MG/24HR TD PTWK
0.1000 mg | MEDICATED_PATCH | TRANSDERMAL | Status: DC
  Administered 2015-08-14: 0.1 mg via TRANSDERMAL
  Filled 2015-08-14: qty 1

## 2015-08-14 MED ORDER — METOPROLOL TARTRATE 1 MG/ML IV SOLN
5.0000 mg | Freq: Four times a day (QID) | INTRAVENOUS | Status: DC
  Administered 2015-08-14 – 2015-08-15 (×4): 5 mg via INTRAVENOUS
  Filled 2015-08-14 (×5): qty 5

## 2015-08-14 MED ORDER — MIDAZOLAM HCL 2 MG/2ML IJ SOLN
INTRAMUSCULAR | Status: AC
  Filled 2015-08-14: qty 4

## 2015-08-14 MED ORDER — METOPROLOL TARTRATE 1 MG/ML IV SOLN
2.5000 mg | Freq: Once | INTRAVENOUS | Status: AC
  Administered 2015-08-14: 2.5 mg via INTRAVENOUS
  Filled 2015-08-14: qty 5

## 2015-08-14 MED ORDER — ALBUTEROL SULFATE (2.5 MG/3ML) 0.083% IN NEBU: 2.5000 mg | INHALATION_SOLUTION | RESPIRATORY_TRACT | Status: DC | PRN

## 2015-08-14 MED ORDER — INSULIN ASPART 100 UNIT/ML ~~LOC~~ SOLN
0.0000 [IU] | SUBCUTANEOUS | Status: DC
  Administered 2015-08-14: 1 [IU] via SUBCUTANEOUS

## 2015-08-14 MED ORDER — SODIUM CHLORIDE 0.9 % IV SOLN
250.0000 mg | Freq: Three times a day (TID) | INTRAVENOUS | Status: DC
  Filled 2015-08-14 (×2): qty 250

## 2015-08-14 MED ORDER — PANTOPRAZOLE SODIUM 40 MG IV SOLR
40.0000 mg | INTRAVENOUS | Status: DC
Start: 2015-08-14 — End: 2015-08-15
  Filled 2015-08-14: qty 40

## 2015-08-14 MED ORDER — DEXTROSE 5 % IV SOLN
1.0000 g | INTRAVENOUS | Status: DC
  Filled 2015-08-14: qty 10

## 2015-08-14 MED ORDER — LEVOFLOXACIN IN D5W 750 MG/150ML IV SOLN
750.0000 mg | INTRAVENOUS | Status: DC
  Administered 2015-08-14: 750 mg via INTRAVENOUS
  Filled 2015-08-14: qty 150

## 2015-08-14 MED ORDER — DEXTROSE 5 % IV SOLN
2.0000 g | INTRAVENOUS | Status: DC
  Filled 2015-08-14: qty 2

## 2015-08-14 MED ORDER — ASPIRIN 300 MG RE SUPP
300.0000 mg | Freq: Every day | RECTAL | Status: DC
  Filled 2015-08-14: qty 1

## 2015-08-14 MED ORDER — WHITE PETROLATUM GEL
Status: AC
  Administered 2015-08-14: 0.2
  Filled 2015-08-14: qty 1

## 2015-08-14 NOTE — Evaluation (Signed)
Clinical/Bedside Swallow Evaluation Patient Details  Name: [name] MRN: [id number] Date of Birth: January 30, 1946  Today's Date: 08/14/2015 Time: SLP Start Time (ACUTE ONLY): 5397 SLP Stop Time (ACUTE ONLY): 1411 SLP Time Calculation (min) (ACUTE ONLY): 18 min  Past Medical History:  Past Medical History  Diagnosis Date  . Alcoholic cirrhosis ([place])   . Reflux esophagitis   . Gastroesophageal reflux   . Colon polyps   . CHF (congestive heart failure) ([place]) 10/13    grade 1 diastolic dysfunction, Nl LVF  . Hypertension   . History of GI diverticular bleed   . Pneumonia     history of  . Asthma   . Anemia   . Blood transfusion few years ago  . Villous adenoma of colon 05/10/11  . PVD (peripheral vascular disease) ([place]) 2009    bilat iliac stenting  . Chronic obstructive pulmonary disease (COPD) ([place])   . Arthritis   . Glaucoma     left  . Gout   . Acute venous embolism and thrombosis of deep vessels of proximal lower extremity ([place])   . Back pain   . Leg swelling   . Diabetes mellitus     insulin dep   . Hepatitis     alcoholic hepatitis  . Depression   . Anxiety   . On home oxygen therapy oxygen 2 liter per minute prn per [place]  . Cellulitis     bilateral lower extremities  . Chronic diastolic congestive heart failure [facility])    Past Surgical History:  Past Surgical History  Procedure Laterality Date  . Cataract extraction Right yrs ago  . Colonoscopy    . Laparoscopic assissted total colectomy w/ j-pouch  04/22/11  . Ventral hernia repair  06/13/2012    Procedure: LAPAROSCOPIC VENTRAL HERNIA;  Surgeon: Adin Hector, MD;  Location: [facility];  Service: General;  Laterality: N/A;  Laparoscopic Assisted Ventral Hernia with Mesh  . Abdominal hysterectomy  yrs ago  . Esophagogastroduodenoscopy (egd) with propofol N/A 08/06/2013    Procedure: ESOPHAGOGASTRODUODENOSCOPY (EGD) WITH PROPOFOL;  Surgeon: Lear Ng, MD;  Location: [facility];  Service: Endoscopy;   Laterality: N/A;  . Colonoscopy with propofol N/A 08/06/2013    Procedure: COLONOSCOPY WITH PROPOFOL;  Surgeon: Lear Ng, MD;  Location: [facility];  Service: Endoscopy;  Laterality: N/A;   HPI:  69 y.o. female with a past medical history significant for HTN, DM, chronic congestive heart failure, COPD, and GERD, admitted to [facility] on 08/10/15 due to altered mental status in the setting of UTI/sepsis. At the time of admission had CT brain that was unremarkable for acute abnormality, then with change in MS and repeat CT showing evidence of cytotoxic edema in the posterior temporal and anterior occipital occipital lobes in the left, suspicious for acute infarct. Now with MS back to baseline and neuro exam nonfocal.  W/u pending.    Assessment / Plan / Recommendation Clinical Impression  Pt presents with functional oropharyngeal swallow with no cranial nerve deficits, adequate mastication of solids, brisk swallow response, no s/s of aspiration.  Pt prefers to start softer diet due to her history of colitis.  Recommend initiating a dysphaga 2, thin liquids and advancing per her preferences.  No SLP f/u warranted.  will sign off.     Aspiration Risk  No limitations    Diet Recommendation     Medication Administration: Whole meds with liquid    Other  Recommendations Oral Care Recommendations: Oral care BID  Follow up Recommendations  None    Frequency and Duration            Prognosis        Swallow Study   General Date of Onset: 08/13/15 HPI: 69 y.o. female with a past medical history significant for HTN, DM, chronic congestive heart failure, COPD, and GERD, admitted to [facility] on 08/10/15 due to altered mental status in the setting of UTI/sepsis. At the time of admission had CT brain that was unremarkable for acute abnormality, then with change in MS and repeat CT showing evidence of cytotoxic edema in the posterior temporal and anterior occipital occipital lobes in the left,  suspicious for acute infarct. Now with MS back to baseline and neuro exam nonfocal.  W/u pending.  Type of Study: Bedside Swallow Evaluation Previous Swallow Assessment: none per records Diet Prior to this Study: NPO Temperature Spikes Noted: No Respiratory Status: Nasal cannula History of Recent Intubation: No Behavior/Cognition: Alert;Cooperative Oral Cavity Assessment: Within Functional Limits Oral Care Completed by SLP: No Oral Cavity - Dentition: Dentures, top;Dentures, bottom Vision: Functional for self-feeding Self-Feeding Abilities: Able to feed self Patient Positioning: Upright in bed Baseline Vocal Quality: Normal Volitional Cough: Strong Volitional Swallow: Able to elicit    Oral/Motor/Sensory Function Overall Oral Motor/Sensory Function: Within functional limits   Ice Chips Ice chips: Within functional limits Presentation: Spoon   Thin Liquid Thin Liquid: Within functional limits Presentation: Cup;Straw    Nectar Thick Nectar Thick Liquid: Not tested   Honey Thick Honey Thick Liquid: Not tested   Puree Puree: Within functional limits   Solid Solid: Within functional limits       [name] 08/14/2015,2:21 PM

## 2015-08-14 NOTE — Progress Notes (Signed)
Pt c/o worsening nausea, depsite PRN zofran with heaving.  CCM notified and orders received.  Will continue to monitor.    Gus Rankin 08/14/2015 8:45 PM

## 2015-08-14 NOTE — Progress Notes (Signed)
eLink Physician-Brief Progress Note Patient Name: [name] DOB: May 12, 1946 MRN: [id number]   Date of Service  08/14/2015  HPI/Events of Note  Nausea. Zofran has not been effective. Pheregan makes her confused.   eICU Interventions  Will order: 1. Compazine 10 mg IV Q 5 hours PRN nausea.     Intervention Category Intermediate Interventions: Other:  Lysle Dingwall 08/14/2015, 8:43 PM

## 2015-08-14 NOTE — Progress Notes (Addendum)
Called to alert family that pt has been transferred to a higher level of care and gave Caren Griffins [place] the room number. Per Caren Griffins she is the pt's medical POA. Caren Griffins states pt has DNR paperwork and will be coming to [facility] shortly. Called to alert Rapid Response RN who is in transport with the pt.

## 2015-08-14 NOTE — Progress Notes (Signed)
PROGRESS NOTE    [name] LKG:[id number] DOB: February 25, 1946 DOA: 08/10/2015 PCP: Philis Fendt, MD  HPI/Brief narrative 69 year old female patient with history of alcoholic cirrhosis, GERD, chronic diastolic CHF, HTN, asthma, COPD, DM, apparently lives alone, ambulates with a cane, states that she was recently discharged home from SNF last Friday after being in SNF for rehabilitation from acute gastroenteritis, presented to ED on 08/10/15 with altered mental status and was found down in the hallway of her home by her caretaker. She was found to be hypoglycemic by EMS in the 60s, hypothermic 93.39F rectally and was admitted to stepdown unit for presumed UTI sepsis. She improved and was transferred to telemetry on 12/15. Overnight 12/15 had couple of episodes of emesis and on morning of 12/16 noted to be lethargic and altered. Workup with CT head confirmed acute CVA and chest x-ray showed possible aspiration pneumonia. Patient deteriorated and became hypoxic. Neurology and CCM consulted. Patient transferred to ICU 12/16 for close monitoring and management.   Assessment/Plan:  Acute respiratory failure with hypoxia - Developed on 08/14/15 due to altered mental status and aspiration pneumonia from nonbloody emesis overnight. - Due to concern for decompensation, CCM consulted and patient was transferred to ICU. She was initially felt to need intubation but improved dramatically. - Pulmonary toilet, IV antibiotics, bronchodilator and oxygen  Aspiration pneumonia - From nausea and vomiting. Antibiotics changed from IV Rocephin to imipenem per pharmacy. Swallow evaluation per speech therapy.  Nausea and vomiting - Unclear etiology.? Related to new stroke. KUB unremarkable. Supportive treatment.  Acute encephalopathy - On admission was secondary to UTI. This had resolved but recurred on 12/16. Noted to be lethargic early this morning and progressively became obtunded.? Related to new stroke  versus meds i.e. Phenergan. After transferring to ICU, patient's mental status significantly improved. Monitor closely. DC Phenergan. - Repeated urine microscopy, culture  Acute left brain infarct/CVA - Neurology consulted and management per neurology. Stroke workup. Aspirin changed to rectal.  Sepsis  (on admission) due to urinary tract infection - Treated per sepsis protocol - Empirically started on IV Fortaz which was deescalated to Rocephin on 12/13. Changed to oral Ceftin on 12/15 - Sepsis physiology resolved. Lactate normalized. - Blood cultures 2: Negative to date. Unfortunately no urine seems to have been sent on admission. - Antibiotics changed on 12/16 to IV Primaxin for aspiration pneumonia  Acute kidney injury  - likely prerenal from dehydration and related to sepsis - Resolved after IV fluid hydration. CK normal.  Arthralgia - On 12/14, indicated pain in several large joints including bilateral shoulders, elbows,? Wrists-left side more than the right. No definitive acute arthritis on exam.? Chronic changes. - Uric acid normal. - X-rays of the left shoulder, elbow and wrist without acute findings. - Venous Dopplers of bilateral upper extremities: Negative for DVT. - Treat supportively with pain medications as needed and follow. Not convinced that she has a gout flare. - Pain controlled/seems to have even resolved. Able to move above joints well without painful distress.  Essential hypertension - Uncontrolled. After verifying home medications, home dose clonidine and carvedilol was resumed on 12/15. Persistent sinus tachycardia for the last couple of days may have been secondary to clonidine withdrawal rebound. Remained in sinus tachycardia in the 130s since midnight 12/15. Due to altered mental status, by mouth medications were discontinued and clonidine was changed to transdermal and beta blockers were changed to IV. Monitor. Allow for permissive hypertension due to acute  stroke.  DM 2 with Hypoglycemia  -  Resolved. Continue SSI. Periodic fluctuations.  COPD - Stable  Right eye ecchymosis - Felt to be due to trauma on admission. CT head and neck without acute findings. Improved.  Chronic anemia - Baseline hemoglobin may be in the 8-9 range. Dropped from 11.3-8.7 is probably dilutional. Stable.  Hypomagnesemia -Replace and follow BMP.  Sinus tachycardia - May be multifactorial related to clonidine withdrawal rebound, aspiration pneumonia. Continue IV beta blockers and transdermal clonidine  DVT prophylaxis: Subcutaneous heparin  Code Status: Full Family Communication: None at bedside Disposition Plan: Transfer to ICU under CCM care on 12/16  Consultants:  CCM  Neurology  Procedures:  None  Antibiotics:  IV Fortaz 12/12 times one dose  IV ciprofloxacin 12/12 times one dose  IV levofloxacin 12/12 times one dose  IV Rocephin 12/13 > 12/14  Oral Ceftin 12/15 >12/16  IV Primaxin 12/16 >  Subjective: Patient lethargic this morning-unable to provide much history but denies chest pain, dyspnea or abdominal pain. As per patient's nursing, had some episodes of nonbloody emesis overnight (no documentation and does not look like TRH were called). Subsequently became more obtunded and hypoxic.  Objective: Filed Vitals:   08/13/15 1637 08/13/15 1736 08/13/15 2002 08/14/15 0432  BP:  148/87 134/80 154/94  Pulse: 93 94 87 143  Temp:  98.2 F (36.8 C) 98.5 F (36.9 C) 98.7 F (37.1 C)  TempSrc:  Oral Oral Oral  Resp: 16 71 19 20  Height:  5' (1.524 m)    Weight:  55.4 kg (122 lb 2.2 oz) 55.2 kg (121 lb 11.1 oz)   SpO2: 100% 95% 95% 100%    Intake/Output Summary (Last 24 hours) at 08/14/15 0743 Last data filed at 08/14/15 0601  Gross per 24 hour  Intake    300 ml  Output    300 ml  Net      0 ml   Filed Weights   08/10/15 1707 08/13/15 1736 08/13/15 2002  Weight: 55.3 kg (121 lb 14.6 oz) 55.4 kg (122 lb 2.2 oz) 55.2 kg (121 lb  11.1 oz)     Exam:  General exam: Patient looks significantly worse compared to 12/15.  HEENT: Pupils equally reacting to light and accommodation. Right periorbital ecchymosis is decreasing. Respiratory system:Reduced breath sounds bilaterally. No increased work of breathing. Cardiovascular system: S1 & S2 heard, RRR. No JVD, murmurs, gallops, clicks or pedal edema.Telemetry: Sinus tachycardia in the 130s since midnight 12/15 and at times in the 160s. No other arrhythmias noted. Gastrointestinal system: Abdomen is nondistended, soft and nontender. Normal bowel sounds heard. Central nervous system: Lethargic and drowsy. To repeated calls, barely opens eyes, oriented to self. No focal neurological deficits. Extremities: Symmetric 5 x 5 power.Boggy swelling of bilateral dorsum of hand and fingers, left >right and no acute arthritic findings on exam of bilateral wrist, elbows or shoulders-this seems more from fluid retention. Able to move both upper extremities without pain or discomfort.   Data Reviewed: Basic Metabolic Panel:  Recent Labs Lab 08/10/15 1148 08/11/15 0140 08/12/15 1316 08/13/15 1920  NA 141 139 138 142  K 3.9 3.4* 4.6 3.9  CL 109 109 112* 109  CO2 25 22 19* 21*  GLUCOSE 89 183* 244* 104*  BUN '12 9 9 9  '$ CREATININE 1.32* 1.11* 1.20* 1.20*  CALCIUM 8.8* 7.6* 7.8* 9.0  MG  --   --  1.1* 1.3*   Liver Function Tests:  Recent Labs Lab 08/10/15 1148 08/11/15 0140  AST 41 39  ALT 27 22  ALKPHOS 182* 122  BILITOT 0.5 0.5  PROT 6.0* 4.4*  ALBUMIN 2.1* 1.5*   No results for input(s): LIPASE, AMYLASE in the last 168 hours. No results for input(s): AMMONIA in the last 168 hours. CBC:  Recent Labs Lab 08/10/15 1148 08/11/15 0140 08/12/15 1316  WBC 14.6* 10.1 12.0*  NEUTROABS 9.6*  --   --   HGB 11.3* 8.7* 9.6*  HCT 35.7* 27.1* 29.2*  MCV 86.7 85.0 85.6  PLT 331 255 246   Cardiac Enzymes:  Recent Labs Lab 08/11/15 0945  CKTOTAL 167   BNP (last 3  results) No results for input(s): PROBNP in the last 8760 hours. CBG:  Recent Labs Lab 08/13/15 0831 08/13/15 1208 08/13/15 1457 08/13/15 1734 08/13/15 2001  GLUCAP 79 110* 94 93 114*    Recent Results (from the past 240 hour(s))  Culture, blood (x 2)     Status: None (Preliminary result)   Collection Time: 08/10/15  7:30 PM  Result Value Ref Range Status   Specimen Description BLOOD RIGHT ANTECUBITAL  Final   Special Requests IN PEDIATRIC BOTTLE 2CC  Final   Culture NO GROWTH 3 DAYS  Final   Report Status PENDING  Incomplete  Culture, blood (x 2)     Status: None (Preliminary result)   Collection Time: 08/10/15  7:40 PM  Result Value Ref Range Status   Specimen Description BLOOD RIGHT HAND  Final   Special Requests IN PEDIATRIC BOTTLE 2CC  Final   Culture NO GROWTH 3 DAYS  Final   Report Status PENDING  Incomplete           Studies: No results found.      Scheduled Meds: . acetaminophen  650 mg Oral TID  . allopurinol  100 mg Oral BID  . aspirin EC  81 mg Oral Daily  . carvedilol  12.5 mg Oral BID WC  . cefUROXime  250 mg Oral BID WC  . cilostazol  50 mg Oral BID AC  . cloNIDine  0.1 mg Oral TID  . ferrous sulfate  325 mg Oral Q breakfast  . heparin  5,000 Units Subcutaneous 3 times per day  . insulin aspart  0-5 Units Subcutaneous QHS  . insulin aspart  0-9 Units Subcutaneous TID WC  . magnesium oxide  400 mg Oral Daily  . magnesium sulfate 1 - 4 g bolus IVPB  2 g Intravenous Once  . montelukast  10 mg Oral QHS  . pantoprazole  80 mg Oral Daily  . simvastatin  10 mg Oral Daily  . sodium chloride  3 mL Intravenous Q12H  . [START ON 08/17/2015] Vitamin D (Ergocalciferol)  50,000 Units Oral Q Mon   Continuous Infusions:    Principal Problem:   Sepsis ([place]) Active Problems:   COPD (chronic obstructive pulmonary disease) ([facility])   PVD, LEIA/LIIA and RCIA pta 7/09- ABIs 0.68 and 0.69 Feb 2014   Acute encephalopathy   Essential hypertension   Gout    Hypoglycemia   Hypothermia   UTI (lower urinary tract infection)    Time spent: 30 minutes   Cassanda Walmer, MD, FACP, FHM. [facility] Pager [phone]  If 7PM-7AM, please contact night-coverage [email or web address] Password [email or web address] 08/14/2015, 7:43 AM    LOS: 4 days

## 2015-08-14 NOTE — Progress Notes (Signed)
ANTIBIOTIC CONSULT NOTE - INITIAL  Pharmacy Consult for levaquin Indication: aspiration PNA and UTI  Allergies  Allergen Reactions  . Penicillins Hives    Has patient had a PCN reaction causing immediate rash, facial/tongue/throat swelling, SOB or lightheadedness with hypotension: No Has patient had a PCN reaction causing severe rash involving mucus membranes or skin necrosis: No Has patient had a PCN reaction that required hospitalization No Has patient had a PCN reaction occurring within the last 10 years: No If all of the above answers are "NO", then may proceed with Cephalosporin use.   . Lipitor [Atorvastatin] Swelling    Patient Measurements: Height: 5' (152.4 cm) Weight: 121 lb 11.1 oz (55.2 kg) IBW/kg (Calculated) : 45.5   Vital Signs: Temp: 99.3 F (37.4 C) (12/16 1219) Temp Source: Axillary (12/16 1219) BP: 156/95 mmHg (12/16 1330) Pulse Rate: 38 (12/16 1330) Intake/Output from previous day: 12/15 0701 - 12/16 0700 In: 300 [P.O.:300] Out: 300 [Urine:300] Intake/Output from this shift: Total I/O In: 60 [P.O.:60] Out: 0   Labs:  Recent Labs  08/12/15 1316 08/13/15 1920  WBC 12.0*  --   HGB 9.6*  --   PLT 246  --   CREATININE 1.20* 1.20*   Estimated Creatinine Clearance: 33.5 mL/min (by C-G formula based on Cr of 1.2). No results for input(s): VANCOTROUGH, VANCOPEAK, VANCORANDOM, GENTTROUGH, GENTPEAK, GENTRANDOM, TOBRATROUGH, TOBRAPEAK, TOBRARND, AMIKACINPEAK, AMIKACINTROU, AMIKACIN in the last 72 hours.   Microbiology: Recent Results (from the past 720 hour(s))  Culture, blood (x 2)     Status: None (Preliminary result)   Collection Time: 08/10/15  7:30 PM  Result Value Ref Range Status   Specimen Description BLOOD RIGHT ANTECUBITAL  Final   Special Requests IN PEDIATRIC BOTTLE 2CC  Final   Culture NO GROWTH 4 DAYS  Final   Report Status PENDING  Incomplete  Culture, blood (x 2)     Status: None (Preliminary result)   Collection Time: 08/10/15   7:40 PM  Result Value Ref Range Status   Specimen Description BLOOD RIGHT HAND  Final   Special Requests IN PEDIATRIC BOTTLE 2CC  Final   Culture NO GROWTH 4 DAYS  Final   Report Status PENDING  Incomplete    Medical History: Past Medical History  Diagnosis Date  . Alcoholic cirrhosis ([place])   . Reflux esophagitis   . Gastroesophageal reflux   . Colon polyps   . CHF (congestive heart failure) ([place]) 10/13    grade 1 diastolic dysfunction, Nl LVF  . Hypertension   . History of GI diverticular bleed   . Pneumonia     history of  . Asthma   . Anemia   . Blood transfusion few years ago  . Villous adenoma of colon 05/10/11  . PVD (peripheral vascular disease) ([place]) 2009    bilat iliac stenting  . Chronic obstructive pulmonary disease (COPD) ([place])   . Arthritis   . Glaucoma     left  . Gout   . Acute venous embolism and thrombosis of deep vessels of proximal lower extremity ([place])   . Back pain   . Leg swelling   . Diabetes mellitus     insulin dep   . Hepatitis     alcoholic hepatitis  . Depression   . Anxiety   . On home oxygen therapy oxygen 2 liter per minute prn per [place]  . Cellulitis     bilateral lower extremities  . Chronic diastolic congestive heart failure ([place])  Assessment: 69 yo female with sepsis due to UTI and has been on ceftin. Due to concern of aspiration PNA pharmacy has been consulted to dose levaquin (patient noted with PCN allergy).  WBC= 12, afebrile, SCr= 1.2 and CrCl ~ 38.  12/16 levaquin>> 12/15 ceftin>>12/16 12/13 rocephin>>12/15 12/12 fortaz>>12/13  12/12 blood x2>>ngtd  Plan:  - levaquin 750 mg IV q48 hours -Will follow renal function, cultures and clinical progress  Eudelia Bunch, Pharm.D. [phone] 08/14/2015 1:47 PM

## 2015-08-14 NOTE — Progress Notes (Signed)
Pt vomited, notified MD, not yet time for phenergan, unable to tolerate PO Zofran. Will continue to monitor.

## 2015-08-14 NOTE — Consult Note (Signed)
Referring Physician: Dr Algis Liming    Chief Complaint: altered mental status, abnormal CT brain  HPI:                                                                                                                                         [name] is an 69 y.o. female with a past medical history significant for HTN, DM, chronic congestive heart failure, COPD, and GERD, admitted to [facility] on 08/10/15 due to altered mental status in the setting of UTI/sepsis. At the time of admission had CT brain that was unremarkable for acute abnormality. She was doing relatively well until earlier today when she became less responsive, no following commands, and hard to arouse, which prompted CT brain that was read as showing " evidence of cytotoxic edema in the posterior temporal and anterior occipital occipital lobes in the left", suspicious for acute infarct. It is worth noting that patient received a dose of phenergan this morning but no narcotics or benzodiazepines administered. Patient was transferred to the ICU for more aggressive management and shortly after arriving to the unit became fully responsive, oriented, and showing no lateralizing neurological signs. Results of today labs are pending.  Date last known well: unable to determine  Time last known well: unable to determine tPA Given: no, symptoms resolved   Past Medical History  Diagnosis Date  . Alcoholic cirrhosis ([place])   . Reflux esophagitis   . Gastroesophageal reflux   . Colon polyps   . CHF (congestive heart failure) ([place]) 10/13    grade 1 diastolic dysfunction, Nl LVF  . Hypertension   . History of GI diverticular bleed   . Pneumonia     history of  . Asthma   . Anemia   . Blood transfusion few years ago  . Villous adenoma of colon 05/10/11  . PVD (peripheral vascular disease) ([place]) 2009    bilat iliac stenting  . Chronic obstructive pulmonary disease (COPD) ([place])   . Arthritis   . Glaucoma     left  . Gout   . Acute  venous embolism and thrombosis of deep vessels of proximal lower extremity ([place])   . Back pain   . Leg swelling   . Diabetes mellitus     insulin dep   . Hepatitis     alcoholic hepatitis  . Depression   . Anxiety   . On home oxygen therapy oxygen 2 liter per minute prn per   . Cellulitis     bilateral lower extremities  . Chronic diastolic congestive heart failure [facility])     Past Surgical History  Procedure Laterality Date  . Cataract extraction Right yrs ago  . Colonoscopy    . Laparoscopic assissted total colectomy w/ j-pouch  04/22/11  . Ventral hernia repair  06/13/2012    Procedure: LAPAROSCOPIC VENTRAL HERNIA;  Surgeon: Adin Hector, MD;  Location[facility]  OR;  Service: General;  Laterality: N/A;  Laparoscopic Assisted Ventral Hernia with Mesh  . Abdominal hysterectomy  yrs ago  . Esophagogastroduodenoscopy (egd) with propofol N/A 08/06/2013    Procedure: ESOPHAGOGASTRODUODENOSCOPY (EGD) WITH PROPOFOL;  Surgeon: Lear Ng, MD;  Location: [facility] ENDOSCOPY;  Service: Endoscopy;  Laterality: N/A;  . Colonoscopy with propofol N/A 08/06/2013    Procedure: COLONOSCOPY WITH PROPOFOL;  Surgeon: Lear Ng, MD;  Location: [facility] ENDOSCOPY;  Service: Endoscopy;  Laterality: N/A;    Family History  Problem Relation Age of Onset  . Cancer Brother     heent ca  . Cancer Sister     liver ca   Social History:  reports that she quit smoking about 9 years ago. Her smoking use included Cigarettes. She has a 15 pack-year smoking history. She has never used smokeless tobacco. She reports that she does not drink alcohol or use illicit drugs. Family history: no MS, epilepsy, or brain tumor Allergies:  Allergies  Allergen Reactions  . Penicillins Hives    Has patient had a PCN reaction causing immediate rash, facial/tongue/throat swelling, SOB or lightheadedness with hypotension: No Has patient had a PCN reaction causing severe rash involving mucus membranes or skin necrosis:  No Has patient had a PCN reaction that required hospitalization No Has patient had a PCN reaction occurring within the last 10 years: No If all of the above answers are "NO", then may proceed with Cephalosporin use.   . Lipitor [Atorvastatin] Swelling    Medications:                                                                                                                           Scheduled: . aspirin  300 mg Rectal Daily  . cloNIDine  0.1 mg Transdermal Weekly  . fentaNYL      . folic acid  1 mg Intravenous Daily  . heparin  5,000 Units Subcutaneous 3 times per day  . insulin aspart  0-9 Units Subcutaneous 6 times per day  . metoprolol  5 mg Intravenous 4 times per day  . midazolam      . pantoprazole (PROTONIX) IV  40 mg Intravenous Q24H  . sodium chloride  3 mL Intravenous Q12H  . thiamine IV  100 mg Intravenous Daily    ROS:  History obtained from chart review and patient  General ROS: negative for - chills, fatigue, fever, night sweats, weight gain or weight loss Psychological ROS: negative for - behavioral disorder, hallucinations, memory difficulties, mood swings or suicidal ideation Ophthalmic ROS: negative for - blurry vision, double vision, eye pain or loss of vision ENT ROS: negative for - epistaxis, nasal discharge, oral lesions, sore throat, tinnitus or vertigo Allergy and Immunology ROS: negative for - hives or itchy/watery eyes Hematological and Lymphatic ROS: negative for - bleeding problems, bruising or swollen lymph nodes Endocrine ROS: negative for - galactorrhea, hair pattern changes, polydipsia/polyuria or temperature intolerance Respiratory ROS: negative for - cough, hemoptysis, shortness of breath or wheezing Cardiovascular ROS: negative for - chest pain, dyspnea on exertion, edema or irregular heartbeat Gastrointestinal  ROS: negative for - abdominal pain, diarrhea, hematemesis, nausea/vomiting or stool incontinence Genito-Urinary ROS: negative for - dysuria, hematuria, incontinence or urinary frequency/urgency Musculoskeletal ROS: negative for - joint swelling or muscular weakness Neurological ROS: as noted in HPI Dermatological ROS: negative for rash and skin lesion changes   Physical exam:  Constitutional: well developed, pleasant female in no apparent distress. Blood pressure 162/97, pulse 147, temperature 99.3 F (37.4 C), temperature source Axillary, resp. rate 23, height 5' (1.524 m), weight 55.2 kg (121 lb 11.1 oz), SpO2 95 %. Eyes: no jaundice or exophthalmos.  Head: normocephalic. Neck: supple, no bruits, no JVD. Cardiac: no murmurs. Lungs: clear. Abdomen: soft, no tender, no mass. Extremities: no edema, clubbing, or cyanosis.  Skin: no rash  Neurologic Examination:                                                                                                      General: NAD Mental Status: Alert, oriented, thought content appropriate.  Speech fluent without evidence of aphasia.  Able to follow 3 step commands without difficulty. Cranial Nerves: II: Discs flat bilaterally; Visual fields grossly normal, pupils equal, round, reactive to light and accommodation III,IV, VI: ptosis not present, extra-ocular motions intact bilaterally V,VII: smile symmetric, facial light touch sensation normal bilaterally VIII: hearing normal bilaterally IX,X: uvula rises symmetrically XI: bilateral shoulder shrug XII: midline tongue extension without atrophy or fasciculations  Motor: Right : Upper extremity   5/5    Left:     Upper extremity   5/5  Lower extremity   5/5     Lower extremity   5/5 Tone and bulk:normal tone throughout; no atrophy noted Sensory: Pinprick and light touch intact throughout, bilaterally Deep Tendon Reflexes:  Right: Upper Extremity   Left: Upper extremity   biceps (C-5 to C-6)  2/4   biceps (C-5 to C-6) 2/4 tricep (C7) 2/4    triceps (C7) 2/4 Brachioradialis (C6) 2/4  Brachioradialis (C6) 2/4  Lower Extremity Lower Extremity  quadriceps (L-2 to L-4) 2/4   quadriceps (L-2 to L-4) 2/4 Achilles (S1) 2/4   Achilles (S1) 2/4  Plantars: Right: downgoing   Left: downgoing Cerebellar: normal finger-to-nose,  normal heel-to-shin test Gait:  No tested due to multiple leads    Results for orders placed or performed  during the hospital encounter of 08/10/15 (from the past 48 hour(s))  Magnesium     Status: Abnormal   Collection Time: 08/12/15  1:16 PM  Result Value Ref Range   Magnesium 1.1 (L) 1.7 - 2.4 mg/dL  Basic metabolic panel     Status: Abnormal   Collection Time: 08/12/15  1:16 PM  Result Value Ref Range   Sodium 138 135 - 145 mmol/L   Potassium 4.6 3.5 - 5.1 mmol/L   Chloride 112 (H) 101 - 111 mmol/L   CO2 19 (L) 22 - 32 mmol/L   Glucose, Bld 244 (H) 65 - 99 mg/dL   BUN 9 6 - 20 mg/dL   Creatinine, Ser 1.20 (H) 0.44 - 1.00 mg/dL   Calcium 7.8 (L) 8.9 - 10.3 mg/dL   GFR calc non Af Amer 44 (L) >60 mL/min   GFR calc Af Amer 51 (L) >60 mL/min    Comment: (NOTE) The eGFR has been calculated using the CKD EPI equation. This calculation has not been validated in all clinical situations. eGFR's persistently <60 mL/min signify possible Chronic Kidney Disease.    Anion gap 7 5 - 15  CBC     Status: Abnormal   Collection Time: 08/12/15  1:16 PM  Result Value Ref Range   WBC 12.0 (H) 4.0 - 10.5 K/uL   RBC 3.41 (L) 3.87 - 5.11 MIL/uL   Hemoglobin 9.6 (L) 12.0 - 15.0 g/dL   HCT 29.2 (L) 36.0 - 46.0 %   MCV 85.6 78.0 - 100.0 fL   MCH 28.2 26.0 - 34.0 pg   MCHC 32.9 30.0 - 36.0 g/dL   RDW 15.2 11.5 - 15.5 %   Platelets 246 150 - 400 K/uL  Glucose, capillary     Status: Abnormal   Collection Time: 08/12/15  3:36 PM  Result Value Ref Range   Glucose-Capillary 179 (H) 65 - 99 mg/dL   Comment 1 Notify RN    Comment 2 Document in Chart   Glucose,  capillary     Status: Abnormal   Collection Time: 08/12/15  9:30 PM  Result Value Ref Range   Glucose-Capillary 219 (H) 65 - 99 mg/dL   Comment 1 Notify RN    Comment 2 Document in Chart   Glucose, capillary     Status: None   Collection Time: 08/13/15  8:31 AM  Result Value Ref Range   Glucose-Capillary 79 65 - 99 mg/dL   Comment 1 Notify RN    Comment 2 Document in Chart   TSH     Status: None   Collection Time: 08/13/15  9:18 AM  Result Value Ref Range   TSH 1.080 0.350 - 4.500 uIU/mL  Glucose, capillary     Status: Abnormal   Collection Time: 08/13/15 12:08 PM  Result Value Ref Range   Glucose-Capillary 110 (H) 65 - 99 mg/dL  Glucose, capillary     Status: None   Collection Time: 08/13/15  2:57 PM  Result Value Ref Range   Glucose-Capillary 94 65 - 99 mg/dL   Comment 1 Notify RN    Comment 2 Document in Chart   Glucose, capillary     Status: None   Collection Time: 08/13/15  5:34 PM  Result Value Ref Range   Glucose-Capillary 93 65 - 99 mg/dL  Basic metabolic panel     Status: Abnormal   Collection Time: 08/13/15  7:20 PM  Result Value Ref Range   Sodium 142 135 - 145 mmol/L  Potassium 3.9 3.5 - 5.1 mmol/L   Chloride 109 101 - 111 mmol/L   CO2 21 (L) 22 - 32 mmol/L   Glucose, Bld 104 (H) 65 - 99 mg/dL   BUN 9 6 - 20 mg/dL   Creatinine, Ser 1.20 (H) 0.44 - 1.00 mg/dL   Calcium 9.0 8.9 - 10.3 mg/dL   GFR calc non Af Amer 44 (L) >60 mL/min   GFR calc Af Amer 51 (L) >60 mL/min    Comment: (NOTE) The eGFR has been calculated using the CKD EPI equation. This calculation has not been validated in all clinical situations. eGFR's persistently <60 mL/min signify possible Chronic Kidney Disease.    Anion gap 12 5 - 15  Magnesium     Status: Abnormal   Collection Time: 08/13/15  7:20 PM  Result Value Ref Range   Magnesium 1.3 (L) 1.7 - 2.4 mg/dL  Glucose, capillary     Status: Abnormal   Collection Time: 08/13/15  8:01 PM  Result Value Ref Range   Glucose-Capillary  114 (H) 65 - 99 mg/dL  Glucose, capillary     Status: Abnormal   Collection Time: 08/14/15  7:39 AM  Result Value Ref Range   Glucose-Capillary 155 (H) 65 - 99 mg/dL  Glucose, capillary     Status: Abnormal   Collection Time: 08/14/15 11:35 AM  Result Value Ref Range   Glucose-Capillary 149 (H) 65 - 99 mg/dL   Ct Head Wo Contrast  08/14/2015  CLINICAL DATA:  Unwitnessed fall. History of encephalopathy. Vomiting. EXAM: CT HEAD WITHOUT CONTRAST TECHNIQUE: Contiguous axial images were obtained from the base of the skull through the vertex without intravenous contrast. COMPARISON:  August 10, 2015 FINDINGS: FINDINGS The ventricles are normal in size and configuration. No mass, hemorrhage, extra-axial fluid collection, or midline shift is apparent. There is decreased attenuation throughout portions of the superior left temporal and anterior left occipital lobe compared to the normal-appearing right side, representing a change from the prior study. This appearance is concerning for acute infarct in the posterior temporal and anterior upper occipital lobes on the left. No other evidence of acute infarct. There is mild small vessel disease in the centra semiovale. The appearance of the calvarium suggests osteomalacia, stable. No fracture is evident. Mastoid air cells are clear. There is chronic remodeling of the right maxillary antrum. No intraorbital lesions are identified. IMPRESSION: Evidence of cytotoxic edema in the posterior temporal and anterior occipital lobes on the left. Acute infarct in this region is suspected. No hemorrhage.  No mass or extra-axial fluid collection. 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 Chronic remodeling in the right maxillary sinus region. These results will be called to the ordering clinician or representative by the Radiologist Assistant, and communication documented in the PACS or zVision Dashboard. Electronically Signed   By: Lowella Grip III M.D.   On:  08/14/2015 11:21   Dg Chest Port 1 View  08/14/2015  CLINICAL DATA:  Vomiting EXAM: PORTABLE CHEST 1 VIEW COMPARISON:  08/10/2015 FINDINGS: Progression of bibasilar airspace disease and bilateral effusions hearing Pulmonary vascularity is unchanged and felt to be within normal limits. No definite heart failure. Chronic fracture deformity of the proximal right humerus unchanged IMPRESSION: Progression of bibasilar airspace disease and small effusions. Possible aspiration pneumonia. Fluid overload is a less likely consideration. Electronically Signed   By: Franchot Gallo M.D.   On: 08/14/2015 10:15   Dg Abd Portable 1v  08/14/2015  CLINICAL DATA:  Vomiting EXAM: PORTABLE ABDOMEN -  1 VIEW COMPARISON:  05/11/2015 FINDINGS: Normal bowel gas pattern. Negative for bowel obstruction or ileus. Surgical bowel clips in the region of the cecum. Several small left renal calculi.  Possible right renal calculus. Bilateral iliac artery stents.  No acute skeletal abnormality. IMPRESSION: Normal bowel gas pattern. Electronically Signed   By: Franchot Gallo M.D.   On: 08/14/2015 10:39    Assessment: 69 y.o. female admitted to [facility] with AMS in the setting of UTI/sepsis, reported as poorly responsive earlier today and CT head with findings " concerning for acute infarct left temporal occipital region". Patient mental status is now back to baseline and her neuro-exam is non focal. Importantly, she received phenergan at some time before this morning new development. I wonder if patient had transient mental state changes induced by phenergan, as will not expect such rapid resolution of altered mental status if she really has that degree of cytotoxic edema on CT brain. In any case, agree with MRI brain to further address previously noted CT findings. Will follow up pending MRI results.  Stroke Risk Factors - age, HTN, DM, chronic congestive heart failure   Dorian Pod, MD [facility] [phone]  08/14/2015, 12:34 PM

## 2015-08-14 NOTE — Progress Notes (Signed)
RN received CT of head results notified MD. Paged rapid response. Pt returned to unit from CT scan, hr rate 172 sustained With administration of IV Metoprolol, IV infiltrated. IV team placed new IV with ultrasound. Pt blood pressure 144/80 MD aware, at bedside. very lethargic; able to arouse with sternal rub. Pt oxygen desaturated to 88% on room air. Placed on non-rebreather mask, 02 sat 100%. Transfer orders received, pt transferred to 22m1, notified CCMD of transfer. 6e Charge Nurse notified pt's HCPOA    08/14/2015 SPenni Bombard RN

## 2015-08-14 NOTE — Progress Notes (Signed)
ANTIBIOTIC CONSULT NOTE - INITIAL  Pharmacy Consult for imipenem Indication: aspiration PNA  Allergies  Allergen Reactions  . Penicillins Hives    Has patient had a PCN reaction causing immediate rash, facial/tongue/throat swelling, SOB or lightheadedness with hypotension: No Has patient had a PCN reaction causing severe rash involving mucus membranes or skin necrosis: No Has patient had a PCN reaction that required hospitalization No Has patient had a PCN reaction occurring within the last 10 years: No If all of the above answers are "NO", then may proceed with Cephalosporin use.   . Lipitor [Atorvastatin] Swelling    Patient Measurements: Height: 5' (152.4 cm) Weight: 121 lb 11.1 oz (55.2 kg) IBW/kg (Calculated) : 45.5   Vital Signs: Temp: 97.8 F (36.6 C) (12/16 0859) Temp Source: Oral (12/16 0859) BP: 145/85 mmHg (12/16 0859) Pulse Rate: 96 (12/16 0859) Intake/Output from previous day: 12/15 0701 - 12/16 0700 In: 300 [P.O.:300] Out: 300 [Urine:300] Intake/Output from this shift: Total I/O In: 60 [P.O.:60] Out: 0   Labs:  Recent Labs  08/12/15 1316 08/13/15 1920  WBC 12.0*  --   HGB 9.6*  --   PLT 246  --   CREATININE 1.20* 1.20*   Estimated Creatinine Clearance: 33.5 mL/min (by C-G formula based on Cr of 1.2). No results for input(s): VANCOTROUGH, VANCOPEAK, VANCORANDOM, GENTTROUGH, GENTPEAK, GENTRANDOM, TOBRATROUGH, TOBRAPEAK, TOBRARND, AMIKACINPEAK, AMIKACINTROU, AMIKACIN in the last 72 hours.   Microbiology: Recent Results (from the past 720 hour(s))  Culture, blood (x 2)     Status: None (Preliminary result)   Collection Time: 08/10/15  7:30 PM  Result Value Ref Range Status   Specimen Description BLOOD RIGHT ANTECUBITAL  Final   Special Requests IN PEDIATRIC BOTTLE 2CC  Final   Culture NO GROWTH 4 DAYS  Final   Report Status PENDING  Incomplete  Culture, blood (x 2)     Status: None (Preliminary result)   Collection Time: 08/10/15  7:40 PM   Result Value Ref Range Status   Specimen Description BLOOD RIGHT HAND  Final   Special Requests IN PEDIATRIC BOTTLE 2CC  Final   Culture NO GROWTH 4 DAYS  Final   Report Status PENDING  Incomplete    Medical History: Past Medical History  Diagnosis Date  . Alcoholic cirrhosis ([place])   . Reflux esophagitis   . Gastroesophageal reflux   . Colon polyps   . CHF (congestive heart failure) ([place]) 10/13    grade 1 diastolic dysfunction, Nl LVF  . Hypertension   . History of GI diverticular bleed   . Pneumonia     history of  . Asthma   . Anemia   . Blood transfusion few years ago  . Villous adenoma of colon 05/10/11  . PVD (peripheral vascular disease) ([place]) 2009    bilat iliac stenting  . Chronic obstructive pulmonary disease (COPD) ([place])   . Arthritis   . Glaucoma     left  . Gout   . Acute venous embolism and thrombosis of deep vessels of proximal lower extremity ([place])   . Back pain   . Leg swelling   . Diabetes mellitus     insulin dep   . Hepatitis     alcoholic hepatitis  . Depression   . Anxiety   . On home oxygen therapy oxygen 2 liter per minute prn per [place]  . Cellulitis     bilateral lower extremities  . Chronic diastolic congestive heart failure ([facility])     Assessment: 69  yo female with sepsis due to UTI and has been on ceftin. Due to concern of aspiration PNA pharmacy has been consulted to dose imipenem (patient noted with PCN allergy).  WBC= 12, afebrile, SCr= 1.2 and CrCl ~ 30.  12/16 imipenem>> 12/15 ceftin>>12/16 12/13 rocephin>>12/15 12/12 fortaz>>12/13  12/12 blood x2  Plan:  -Imipenem '250mg'$  IV q8h -Will follow renal function, cultures and clinical progress  Hildred Laser, Pharm D 08/14/2015 11:46 AM

## 2015-08-14 NOTE — Progress Notes (Signed)
Per report with night shift RN, pt had hr sustained in 160's throughout the night. RN administered Lopressor. Also, pt vomited 3 times throughout the night; night RN administered phenergan. Pt vomited with day RN Delana Meyer S.), notified MD. MD at bedside to see pt.    Penni Bombard, RN 08/14/2015

## 2015-08-14 NOTE — Significant Event (Signed)
Rapid Response Event Note  Overview: Time Called: 7711 Arrival Time: 6579 Event Type: Neurologic  Initial Focused Assessment:  Called by RN for patient with Ct Head results indicating an acute infarct with edema.  As per Rn MD aware.  LSW unknown.  As per RN patient was talking and normal yesterday and this am patient was noted to be lethargic and hard to atrouse.  Spoke with Dr. Algis Liming, sats are dropping and less responsive, went to patients bedside.  On arrival to patients room, patient on NRB sats 95%.  SBP 140's, HR 120's.     Interventions:  IV team obtaining PIV via ultrasound successful.  Patient withdraws to deep painful stimuli, not speaking or following commands.  Dr. Algis Liming spoke with critical care.  Dr. Halford Chessman at bedside.  Orders for ICU bed.   Event Summary:  Patient transported to 3 m 11 via bed with monitor and 100% oxygen.  Report given to bedside RN vy primary RN   at      at          [facility], Harlin Rain

## 2015-08-14 NOTE — Progress Notes (Signed)
PT Cancellation Note  Patient Details Name: [name] MRN: [id number] DOB: [birth year]   Cancelled Treatment:    Reason Eval/Treat Not Completed: Medical issues which prohibited therapy CT results indicate suspected acute temporal/occipital infarct. Will hold physical therapy at this time.  Ellouise Newer 08/14/2015, 11:39 AM  Elayne Snare, [place]

## 2015-08-14 NOTE — Consult Note (Signed)
PULMONARY / CRITICAL CARE MEDICINE   Name: KEVIN MARIO MRN: [id number] DOB: January 29, 1946    ADMISSION DATE:  08/10/2015 CONSULTATION DATE:  08/14/2015  REFERRING MD:  [facility]  CHIEF COMPLAINT:  Altered mental status  HISTORY OF PRESENT ILLNESS:   69 yo female presented with altered mental status, fever, chest pain, dyspnea, palpitations, abdominal pain, dysuria, back pain.  She was admitted with sepsis and UTI.  She had fall and noted to have ecchymosis around Rt eye.  CT was unremarkable.  She was hypoglycemia and hypothermic.  On 12/15 she developed tachycardia, hypertension, and altered mental status.  She had episode of vomiting with concern for aspiration.  She had repeat CT head which showed evidence of cytotoxic edema in the posterior temporal and anterior occipital lobes on the left >> Acute infarct in this region is suspected.  She was transferred to ICU.  Initial concern was that she would need intubation.  However, shortly after arrival to ICU she regained her mental status.  Of note is that she was given phenergan overnight.  PAST MEDICAL HISTORY :  She  has a past medical history of Alcoholic cirrhosis ([place]); Reflux esophagitis; Gastroesophageal reflux; Colon polyps; CHF (congestive heart failure) ([place]) (10/13); Hypertension; History of GI diverticular bleed; Pneumonia; Asthma; Anemia; Blood transfusion (few years ago); Villous adenoma of colon (05/10/11); PVD (peripheral vascular disease) ([place]) (2009); Chronic obstructive pulmonary disease (COPD) ([place]); Arthritis; Glaucoma; Gout; Acute venous embolism and thrombosis of deep vessels of proximal lower extremity ([place]); Back pain; Leg swelling; Diabetes mellitus; Hepatitis; Depression; Anxiety; On home oxygen therapy (oxygen 2 liter per minute prn per [place]); Cellulitis; and Chronic diastolic congestive heart failure ([place]).  PAST SURGICAL HISTORY: She  has past surgical history that includes Cataract extraction (Right, yrs ago);  Colonoscopy; Laparoscopic assissted total colectomy w/ j-pouch (04/22/11); Ventral hernia repair (06/13/2012); Abdominal hysterectomy (yrs ago); Esophagogastroduodenoscopy (egd) with propofol (N/A, 08/06/2013); and Colonoscopy with propofol (N/A, 08/06/2013).  Allergies  Allergen Reactions  . Penicillins Hives    Has patient had a PCN reaction causing immediate rash, facial/tongue/throat swelling, SOB or lightheadedness with hypotension: No Has patient had a PCN reaction causing severe rash involving mucus membranes or skin necrosis: No Has patient had a PCN reaction that required hospitalization No Has patient had a PCN reaction occurring within the last 10 years: No If all of the above answers are "NO", then may proceed with Cephalosporin use.   . Lipitor [Atorvastatin] Swelling    No current facility-administered medications on file prior to encounter.   Current Outpatient Prescriptions on File Prior to Encounter  Medication Sig  . albuterol (PROVENTIL HFA;VENTOLIN HFA) 108 (90 BASE) MCG/ACT inhaler Inhale 2 puffs into the lungs every 6 (six) hours as needed. For shortness of breath  . albuterol (PROVENTIL) (2.5 MG/3ML) 0.083% nebulizer solution Take 2.5 mg by nebulization every 6 (six) hours as needed for wheezing or shortness of breath.  . allopurinol (ZYLOPRIM) 100 MG tablet Take 100 mg by mouth 2 (two) times daily.  Marland Kitchen aspirin 81 MG EC tablet Take 81 mg by mouth daily.    . cilostazol (PLETAL) 100 MG tablet Take 50 mg by mouth 2 (two) times daily before a meal.   . cloNIDine (CATAPRES) 0.1 MG tablet Take 1 tablet (0.1 mg total) by mouth 3 (three) times daily.  . ergocalciferol (VITAMIN D2) 50000 UNITS capsule Take 50,000 Units by mouth every Monday. On Monday  . fluticasone (FLONASE) 50 MCG/ACT nasal spray Place 2 sprays into the nose  daily as needed for allergies.   Marland Kitchen glucose monitoring kit (FREESTYLE) monitoring kit 1 each by Does not apply route 4 (four) times daily - after meals and  at bedtime. 1 month Diabetic Testing Supplies for QAC-QHS accuchecks.Any brand OK  . montelukast (SINGULAIR) 10 MG tablet Take 10 mg by mouth at bedtime.   Marland Kitchen omeprazole (PRILOSEC) 40 MG capsule Take 40 mg by mouth daily.   . OXYGEN-HELIUM IN Inhale 2 L into the lungs at bedtime. 2 liters nightly  . simvastatin (ZOCOR) 10 MG tablet Take 10 mg by mouth daily.   . traMADol (ULTRAM) 50 MG tablet Take 1 tablet (50 mg total) by mouth every 6 (six) hours as needed.  . [DISCONTINUED] insulin aspart (NOVOLOG) 100 UNIT/ML injection Before each meal 3 times a day, 140-199 - 2 units, 200-250 - 4 units, 251-299 - 6 units,  300-349 - 8 units,  350 or above 10 units. Insulin PEN if approved, provide syringes and needles if needed. (Patient not taking: Reported on 05/09/2015)    FAMILY HISTORY:  Her indicated that her mother is deceased. She indicated that her father is alive. She indicated that only one of her two sisters is alive. She indicated that four of her six brothers are alive.   SOCIAL HISTORY: She  reports that she quit smoking about 9 years ago. Her smoking use included Cigarettes. She has a 15 pack-year smoking history. She has never used smokeless tobacco. She reports that she does not drink alcohol or use illicit drugs.  REVIEW OF SYSTEMS:   Negative except above.  SUBJECTIVE:  Surprised she is in ICU.  VITAL SIGNS: BP 144/80 mmHg  Pulse 147  Temp(Src) 98.4 F (36.9 C) (Oral)  Resp 18  Ht 5' (1.524 m)  Wt 121 lb 11.1 oz (55.2 kg)  BMI 23.77 kg/m2  SpO2 95%  HEMODYNAMICS:    VENTILATOR SETTINGS:    INTAKE / OUTPUT: I/O last 3 completed shifts: In: 2426 [P.O.:1020; I.V.:3] Out: 300 [Urine:300]  PHYSICAL EXAMINATION: General:  Alert Neuro:  Follows commands, moves extremities HEENT:  Pupils reactive Cardiovascular:  Regular, tachycardic Lungs:  Basilar crackles Abdomen:  Soft, non tender Musculoskeletal:  No edema Skin:  No rashes  LABS:  BMET  Recent Labs Lab  08/11/15 0140 08/12/15 1316 08/13/15 1920  NA 139 138 142  K 3.4* 4.6 3.9  CL 109 112* 109  CO2 22 19* 21*  BUN _0 CREATININE 1.11* 1.20* 1.20*  GLUCOSE 183* 244* 104*    Electrolytes  Recent Labs Lab 08/11/15 0140 08/12/15 1316 08/13/15 1920  CALCIUM 7.6* 7.8* 9.0  MG  --  1.1* 1.3*    CBC  Recent Labs Lab 08/10/15 1148 08/11/15 0140 08/12/15 1316  WBC 14.6* 10.1 12.0*  HGB 11.3* 8.7* 9.6*  HCT 35.7* 27.1* 29.2*  PLT 331 255 246    Coag's  Recent Labs Lab 08/10/15 1726  APTT 34  INR 1.15    Sepsis Markers  Recent Labs Lab 08/10/15 1647 08/10/15 1726 08/10/15 2130 08/11/15 0152  LATICACIDVEN 1.5  --  2.4* 1.7  PROCALCITON  --  <0.10  --   --     ABG No results for input(s): PHART, PCO2ART, PO2ART in the last 168 hours.  Liver Enzymes  Recent Labs Lab 08/10/15 1148 08/11/15 0140  AST 41 39  ALT 27 22  ALKPHOS 182* 122  BILITOT 0.5 0.5  ALBUMIN 2.1* 1.5*    Cardiac Enzymes No results for input(s): TROPONINI,  PROBNP in the last 168 hours.  Glucose  Recent Labs Lab 08/13/15 1208 08/13/15 1457 08/13/15 1734 08/13/15 2001 08/14/15 0739 08/14/15 1135  GLUCAP 110* 94 93 114* 155* 149*    Imaging Ct Head Wo Contrast  08/14/2015  CLINICAL DATA:  Unwitnessed fall. History of encephalopathy. Vomiting. EXAM: CT HEAD WITHOUT CONTRAST TECHNIQUE: Contiguous axial images were obtained from the base of the skull through the vertex without intravenous contrast. COMPARISON:  August 10, 2015 FINDINGS: FINDINGS The ventricles are normal in size and configuration. No mass, hemorrhage, extra-axial fluid collection, or midline shift is apparent. There is decreased attenuation throughout portions of the superior left temporal and anterior left occipital lobe compared to the normal-appearing right side, representing a change from the prior study. This appearance is concerning for acute infarct in the posterior temporal and anterior upper  occipital lobes on the left. No other evidence of acute infarct. There is mild small vessel disease in the centra semiovale. The appearance of the calvarium suggests osteomalacia, stable. No fracture is evident. Mastoid air cells are clear. There is chronic remodeling of the right maxillary antrum. No intraorbital lesions are identified. IMPRESSION: Evidence of cytotoxic edema in the posterior temporal and anterior occipital lobes on the left. Acute infarct in this region is suspected. No hemorrhage.  No mass or extra-axial fluid collection. 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 Chronic remodeling in the right maxillary sinus region. These results will be called to the ordering clinician or representative by the Radiologist Assistant, and communication documented in the PACS or zVision Dashboard. Electronically Signed   By: Lowella Grip III M.D.   On: 08/14/2015 11:21   Dg Chest Port 1 View  08/14/2015  CLINICAL DATA:  Vomiting EXAM: PORTABLE CHEST 1 VIEW COMPARISON:  08/10/2015 FINDINGS: Progression of bibasilar airspace disease and bilateral effusions hearing Pulmonary vascularity is unchanged and felt to be within normal limits. No definite heart failure. Chronic fracture deformity of the proximal right humerus unchanged IMPRESSION: Progression of bibasilar airspace disease and small effusions. Possible aspiration pneumonia. Fluid overload is a less likely consideration. Electronically Signed   By: Franchot Gallo M.D.   On: 08/14/2015 10:15   Dg Abd Portable 1v  08/14/2015  CLINICAL DATA:  Vomiting EXAM: PORTABLE ABDOMEN - 1 VIEW COMPARISON:  05/11/2015 FINDINGS: Normal bowel gas pattern. Negative for bowel obstruction or ileus. Surgical bowel clips in the region of the cecum. Several small left renal calculi.  Possible right renal calculus. Bilateral iliac artery stents.  No acute skeletal abnormality. IMPRESSION: Normal bowel gas pattern. Electronically Signed   By: Franchot Gallo M.D.    On: 08/14/2015 10:39     STUDIES:  12/16 CT head >> cytotoxic edema Lt occipital lobe  CULTURES: 12/12 Blood >>  ANTIBIOTICS: 12/13 Primaxin >> 12/16 12/13 Levaquin >>  SIGNIFICANT EVENTS: 12/12 Admit  12/16 Transfer to ICU, neuro consulted  LINES/TUBES:  DISCUSSION: 70 yo female admitted with altered mental status from sepsis with UTI, hypothermia, and hypoglycemia.  She developed vomiting, tachycardia, hypertension, and altered mental status.  CT head 12/16 was concerning for acute CVA.  She was transferred to ICU, but regained mental status upon arrival to ICU.  She has hx of cirrhosis from ETOH, COPD, diastolic CHF.  ASSESSMENT / PLAN:  PULMONARY A: Acute hypoxic respiratory failure 2nd to aspiration pneumonia. Hx of COPD. P:   Oxygen to keep SpO2 > 92% Defer intubation for now F/u CXR PRN BDs  CARDIOVASCULAR A:  HTN with chronic diastolic  CHF. Hx of HLD. P:  BP goals per neurology  RENAL A:   CKD stage 2. P:   Monitor renal fx, urine outpt, electrolytes  GASTROINTESTINAL A:   Hx of ETOH with cirrhosis. Nutrition. P:   NPO Speech therapy swallow evaluation Thiamine, folic acid  HEMATOLOGIC A:   Anemia of chronic disease. P:  F/u CBC SCDs  INFECTIOUS A:   Aspiration pneumonia. UTI. Hx of PCN allergy. P:   Change to levaquin 12/16   ENDOCRINE A:   Hx of DM on insulin as outpt. Episodes of hypoglycemia on admission. P:   SSI  NEUROLOGIC A:   Acute encephalopathy with concern for acute CVA P:   Neurology consulted Avoid sedatives/opiates  Will transfer to PCCM service while in ICU.  CC time 42 minutes.  Chesley Mires, MD [facility] Pulmonary/Critical Care 08/14/2015, 12:40 PM Pager:  [phone] After 3pm call: [phone]

## 2015-08-14 NOTE — Progress Notes (Signed)
Pt transferred to 3M11 today with altered LOC.   Admitted with AMS, was recently discharged home from SNF last Friday after being in SNF for rehabilitation from acute gastroenteritis. History of alcoholic cirrhosis, GERD, chronic diastolic CHF, HTN, asthma, COPD, DM. Lives alone. Independent with ADL's. Pt uses a personal care service M-F 1.5 hrs to help with house chores but can't remember name of agency. DME: rolling walker, cane, tub bench. Pt states would like a 3 in 1.oday with altered LOC.    Will follow for discharge planning as pt progresses.    Reinaldo Raddle, RN, BSN  Trauma/Neuro ICU Case Manager [phone]

## 2015-08-15 ENCOUNTER — Inpatient Hospital Stay (HOSPITAL_COMMUNITY): Payer: Medicare Other

## 2015-08-15 DIAGNOSIS — R404 Transient alteration of awareness: Secondary | ICD-10-CM

## 2015-08-15 LAB — CULTURE, BLOOD (ROUTINE X 2)
Culture: NO GROWTH
Culture: NO GROWTH

## 2015-08-15 LAB — CBC
HCT: 29 % — ABNORMAL LOW (ref 36.0–46.0)
HEMOGLOBIN: 9.3 g/dL — AB (ref 12.0–15.0)
MCH: 27.8 pg (ref 26.0–34.0)
MCHC: 32.1 g/dL (ref 30.0–36.0)
MCV: 86.6 fL (ref 78.0–100.0)
Platelets: 205 10*3/uL (ref 150–400)
RBC: 3.35 MIL/uL — ABNORMAL LOW (ref 3.87–5.11)
RDW: 15.8 % — ABNORMAL HIGH (ref 11.5–15.5)
WBC: 11.8 10*3/uL — AB (ref 4.0–10.5)

## 2015-08-15 LAB — BASIC METABOLIC PANEL
ANION GAP: 9 (ref 5–15)
BUN: 13 mg/dL (ref 6–20)
CO2: 17 mmol/L — ABNORMAL LOW (ref 22–32)
Calcium: 8.1 mg/dL — ABNORMAL LOW (ref 8.9–10.3)
Chloride: 112 mmol/L — ABNORMAL HIGH (ref 101–111)
Creatinine, Ser: 1.35 mg/dL — ABNORMAL HIGH (ref 0.44–1.00)
GFR calc Af Amer: 45 mL/min — ABNORMAL LOW (ref >60)
GFR, EST NON AFRICAN AMERICAN: 38 mL/min — AB (ref >60)
GLUCOSE: 70 mg/dL (ref 65–99)
POTASSIUM: 4 mmol/L (ref 3.5–5.1)
SODIUM: 138 mmol/L (ref 135–145)

## 2015-08-15 LAB — GLUCOSE, CAPILLARY
GLUCOSE-CAPILLARY: 123 mg/dL — AB (ref 65–99)
GLUCOSE-CAPILLARY: 71 mg/dL (ref 65–99)
Glucose-Capillary: 77 mg/dL (ref 65–99)
Glucose-Capillary: 75 mg/dL (ref 65–99)
Glucose-Capillary: 123 mg/dL — ABNORMAL HIGH (ref 65–99)
Glucose-Capillary: 207 mg/dL — ABNORMAL HIGH (ref 65–99)

## 2015-08-15 MED ORDER — ACETAMINOPHEN 325 MG PO TABS: 650.0000 mg | ORAL_TABLET | Freq: Four times a day (QID) | ORAL | Status: DC | PRN

## 2015-08-15 MED ORDER — ADULT MULTIVITAMIN LIQUID CH: 5.0000 mL | Freq: Every day | ORAL | Status: DC

## 2015-08-15 MED ORDER — VITAMIN B-1 100 MG PO TABS
100.0000 mg | ORAL_TABLET | Freq: Every day | ORAL | Status: DC
  Administered 2015-08-15 – 2015-08-18 (×4): 100 mg via ORAL
  Filled 2015-08-15 (×4): qty 1

## 2015-08-15 MED ORDER — INSULIN ASPART 100 UNIT/ML ~~LOC~~ SOLN
0.0000 [IU] | Freq: Every day | SUBCUTANEOUS | Status: DC
  Administered 2015-08-16 (×2): 2 [IU] via SUBCUTANEOUS

## 2015-08-15 MED ORDER — CARVEDILOL 12.5 MG PO TABS
12.5000 mg | ORAL_TABLET | Freq: Two times a day (BID) | ORAL | Status: DC
  Administered 2015-08-15 – 2015-08-18 (×7): 12.5 mg via ORAL
  Filled 2015-08-15 (×8): qty 1

## 2015-08-15 MED ORDER — SPIRONOLACTONE 12.5 MG HALF TABLET
12.5000 mg | ORAL_TABLET | Freq: Every day | ORAL | Status: DC
  Administered 2015-08-16 – 2015-08-18 (×3): 12.5 mg via ORAL
  Filled 2015-08-15 (×4): qty 1

## 2015-08-15 MED ORDER — FUROSEMIDE 20 MG PO TABS
20.0000 mg | ORAL_TABLET | Freq: Every day | ORAL | Status: DC
  Administered 2015-08-15 – 2015-08-18 (×4): 20 mg via ORAL
  Filled 2015-08-15 (×4): qty 1

## 2015-08-15 MED ORDER — PANTOPRAZOLE SODIUM 40 MG PO TBEC
40.0000 mg | DELAYED_RELEASE_TABLET | Freq: Every day | ORAL | Status: DC
  Administered 2015-08-17 – 2015-08-18 (×2): 40 mg via ORAL
  Filled 2015-08-15 (×2): qty 1

## 2015-08-15 MED ORDER — FOLIC ACID 1 MG PO TABS
1.0000 mg | ORAL_TABLET | Freq: Every day | ORAL | Status: DC
Start: 2015-08-15 — End: 2015-08-18
  Administered 2015-08-15 – 2015-08-18 (×4): 1 mg via ORAL
  Filled 2015-08-15 (×4): qty 1

## 2015-08-15 MED ORDER — INSULIN ASPART 100 UNIT/ML ~~LOC~~ SOLN
0.0000 [IU] | Freq: Three times a day (TID) | SUBCUTANEOUS | Status: DC
  Administered 2015-08-16 – 2015-08-17 (×3): 3 [IU] via SUBCUTANEOUS
  Administered 2015-08-17 (×2): 2 [IU] via SUBCUTANEOUS
  Administered 2015-08-18: 11 [IU] via SUBCUTANEOUS

## 2015-08-15 NOTE — Progress Notes (Signed)
NEURO HOSPITALIST PROGRESS NOTE   SUBJECTIVE:                                                                                                                        Uneventful night. Presently, offers no neurological complains. MRI brain was personally reviewed and showed no acute abnormality or any other concerning abnormality.  OBJECTIVE:                                                                                                                           Vital signs in last 24 hours: Temp:  [97.3 F (36.3 C)-99.3 F (37.4 C)] 97.6 F (36.4 C) (12/17 0353) Pulse Rate:  [25-147] 107 (12/17 0630) Resp:  [12-35] 21 (12/17 0630) BP: (84-168)/(56-112) 154/96 mmHg (12/17 0630) SpO2:  [88 %-100 %] 97 % (12/17 0630)  Intake/Output from previous day: 12/16 0701 - 12/17 0700 In: 320 [P.O.:60; IV Piggyback:150] Out: 600 [Urine:600] Intake/Output this shift:   Nutritional status: DIET DYS 2 Room service appropriate?: Yes; Fluid consistency:: Thin  Past Medical History  Diagnosis Date  . Alcoholic cirrhosis ([place])   . Reflux esophagitis   . Gastroesophageal reflux   . Colon polyps   . CHF (congestive heart failure) ([place]) 10/13    grade 1 diastolic dysfunction, Nl LVF  . Hypertension   . History of GI diverticular bleed   . Pneumonia     history of  . Asthma   . Anemia   . Blood transfusion few years ago  . Villous adenoma of colon 05/10/11  . PVD (peripheral vascular disease) ([place]) 2009    bilat iliac stenting  . Chronic obstructive pulmonary disease (COPD) ([place])   . Arthritis   . Glaucoma     left  . Gout   . Acute venous embolism and thrombosis of deep vessels of proximal lower extremity ([place])   . Back pain   . Leg swelling   . Diabetes mellitus     insulin dep   . Hepatitis     alcoholic hepatitis  . Depression   . Anxiety   . On home oxygen therapy oxygen 2 liter per minute prn per [place]  . Cellulitis     bilateral lower  extremities  .  Chronic diastolic congestive heart failure ([facility])   Physical exam:  Constitutional: well developed, pleasant female in no apparent distress. Eyes: no jaundice or exophthalmos.  Head: normocephalic. Neck: supple, no bruits, no JVD. Cardiac: no murmurs. Lungs: clear. Abdomen: soft, no tender, no mass. Extremities: no edema, clubbing, or cyanosis.  Skin: no rash   Neurologic Exam:  General: NAD Mental Status: Alert, oriented, thought content appropriate. Speech fluent without evidence of aphasia. Able to follow 3 step commands without difficulty. Cranial Nerves: II: Discs flat bilaterally; Visual fields grossly normal, pupils equal, round, reactive to light and accommodation III,IV, VI: ptosis not present, extra-ocular motions intact bilaterally V,VII: smile symmetric, facial light touch sensation normal bilaterally VIII: hearing normal bilaterally IX,X: uvula rises symmetrically XI: bilateral shoulder shrug XII: midline tongue extension without atrophy or fasciculations  Motor: Right :Upper extremity 5/5Left: Upper extremity 5/5 Lower extremity 5/5Lower extremity 5/5 Tone and bulk:normal tone throughout; no atrophy noted Sensory: Pinprick and light touch intact throughout, bilaterally Deep Tendon Reflexes:  Right: Upper Extremity Left: Upper extremity   biceps (C-5 to C-6) 2/4 biceps (C-5 to C-6) 2/4 tricep (C7) 2/4triceps (C7) 2/4 Brachioradialis (C6) 2/4Brachioradialis (C6) 2/4  Lower Extremity Lower Extremity  quadriceps (L-2 to L-4) 2/4 quadriceps (L-2 to L-4) 2/4 Achilles (S1) 2/4Achilles (S1) 2/4  Plantars: Right: downgoingLeft:  downgoing Cerebellar: normal finger-to-nose, normal heel-to-shin test Gait:  No tested due to multiple leads  Lab Results: Lab Results  Component Value Date/Time   CHOL 150 03/04/2014 04:51 PM   Lipid Panel No results for input(s): CHOL, TRIG, HDL, CHOLHDL, VLDL, LDLCALC in the last 72 hours.  Studies/Results: Ct Head Wo Contrast  08/14/2015  CLINICAL DATA:  Unwitnessed fall. History of encephalopathy. Vomiting. EXAM: CT HEAD WITHOUT CONTRAST TECHNIQUE: Contiguous axial images were obtained from the base of the skull through the vertex without intravenous contrast. COMPARISON:  August 10, 2015 FINDINGS: FINDINGS The ventricles are normal in size and configuration. No mass, hemorrhage, extra-axial fluid collection, or midline shift is apparent. There is decreased attenuation throughout portions of the superior left temporal and anterior left occipital lobe compared to the normal-appearing right side, representing a change from the prior study. This appearance is concerning for acute infarct in the posterior temporal and anterior upper occipital lobes on the left. No other evidence of acute infarct. There is mild small vessel disease in the centra semiovale. The appearance of the calvarium suggests osteomalacia, stable. No fracture is evident. Mastoid air cells are clear. There is chronic remodeling of the right maxillary antrum. No intraorbital lesions are identified. IMPRESSION: Evidence of cytotoxic edema in the posterior temporal and anterior occipital lobes on the left. Acute infarct in this region is suspected. No hemorrhage.  No mass or extra-axial fluid collection. 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 Chronic remodeling in the right maxillary sinus region. These results will be called to the ordering clinician or representative by the Radiologist Assistant, and communication documented in the PACS or zVision Dashboard. Electronically Signed   By: Lowella Grip III M.D.   On:  08/14/2015 11:21   Mr Brain Wo Contrast  08/14/2015  CLINICAL DATA:  Code stroke. Decreased responsiveness. Not following commands. EXAM: MRI HEAD WITHOUT CONTRAST TECHNIQUE: Multiplanar, multiecho pulse sequences of the brain and surrounding structures were obtained without intravenous contrast. COMPARISON:  CT head without contrast from the same day. FINDINGS: Diffusion-weighted images demonstrate no acute or subacute infarction. No acute hemorrhage or mass lesion is present. Mild periventricular and subcortical T2 changes are  evident bilaterally. Ventricles are proportionate to the degree of atrophy. The internal auditory canals are within normal limits. White matter changes extend into the brainstem. The cerebellum is unremarkable. Flow is present in the major intracranial arteries. Bilateral lens replacements are present. Globes and orbits are otherwise intact. Chronic right maxillary sinus disease is present. The remaining paranasal sinuses and the mastoid air cells are clear. The skullbase is within normal limits. Midline sagittal images are within normal limits apart from degenerative change in the upper cervical spine. IMPRESSION: 1. No acute intracranial abnormality or focal lesion to explain the patient's change in mental status. 2. The CT finding is likely artifactual was the patient was somewhat tilted in the scanner. 3. Mild atrophy and white matter disease likely reflects the sequela of chronic microvascular ischemia. 4. Spondylosis in the upper cervical spine at C3-4. 5. Chronic right maxillary sinus opacification. Electronically Signed   By: [place] Morelle M.D.   On: 08/14/2015 18:29   Dg Chest Port 1 View  08/15/2015  CLINICAL DATA:  Patient with history of aspiration pneumonia. EXAM: PORTABLE CHEST 1 VIEW COMPARISON:  Chest radiograph 08/14/2015. FINDINGS: The inferior aspect of the chest is not included on current evaluation. Monitoring leads overlie the patient. Stable cardiac and  mediastinal contours. Persistent small layering bilateral pleural effusions with underlying pulmonary consolidation. IMPRESSION: Layering bilateral pleural effusions and underlying opacities may represent atelectasis, aspiration and infection. Electronically Signed   By: Lovey Newcomer M.D.   On: 08/15/2015 07:58   Dg Chest Port 1 View  08/14/2015  CLINICAL DATA:  Vomiting EXAM: PORTABLE CHEST 1 VIEW COMPARISON:  08/10/2015 FINDINGS: Progression of bibasilar airspace disease and bilateral effusions hearing Pulmonary vascularity is unchanged and felt to be within normal limits. No definite heart failure. Chronic fracture deformity of the proximal right humerus unchanged IMPRESSION: Progression of bibasilar airspace disease and small effusions. Possible aspiration pneumonia. Fluid overload is a less likely consideration. Electronically Signed   By: Franchot Gallo M.D.   On: 08/14/2015 10:15   Dg Abd Portable 1v  08/14/2015  CLINICAL DATA:  Vomiting EXAM: PORTABLE ABDOMEN - 1 VIEW COMPARISON:  05/11/2015 FINDINGS: Normal bowel gas pattern. Negative for bowel obstruction or ileus. Surgical bowel clips in the region of the cecum. Several small left renal calculi.  Possible right renal calculus. Bilateral iliac artery stents.  No acute skeletal abnormality. IMPRESSION: Normal bowel gas pattern. Electronically Signed   By: Franchot Gallo M.D.   On: 08/14/2015 10:39    MEDICATIONS                                                                                                                        Scheduled: . antiseptic oral rinse  7 mL Mouth Rinse BID  . aspirin  325 mg Oral Daily  . cloNIDine  0.1 mg Transdermal Weekly  . folic acid  1 mg Intravenous Daily  . heparin  5,000 Units Subcutaneous 3 times per day  . insulin aspart  0-9 Units Subcutaneous 6 times per day  . [START ON 08/16/2015] levofloxacin (LEVAQUIN) IV  750 mg Intravenous Q48H  . metoprolol  5 mg Intravenous 4 times per day  .  pantoprazole (PROTONIX) IV  40 mg Intravenous Q24H  . sodium chloride  3 mL Intravenous Q12H  . thiamine IV  100 mg Intravenous Daily    ASSESSMENT/PLAN:                                                                                                            69 y.o. female admitted to [facility] with AMS in the setting of UTI/sepsis, who yesterday sustained an episode of transient alteration of awareness and had CT head with findings " concerning for acute infarct left temporal occipital region". Patient mental status is back to baseline, her neuro-exam is non focal, and MRI brain is unremarkable. Hence, likely medication induced transient unresponsiveness. Neurology will sign off.  Dorian Pod, MD [facility] [phone]  08/15/2015, 8:14 AM

## 2015-08-15 NOTE — Progress Notes (Signed)
Patient was transferred to ICU under CCM care on 08/14/2015. Reviewed CCM notes today> doing better and being transferred out of ICU. Will pick up care on [place] on 12/18.  Vernell Leep, MD, FACP, FHM. [facility] Pager [phone]  If 7PM-7AM, please contact night-coverage [email or web address] Password [email or web address] 08/15/2015, 8:55 AM

## 2015-08-15 NOTE — Progress Notes (Signed)
Attempted to give report 

## 2015-08-15 NOTE — Consult Note (Signed)
PULMONARY / CRITICAL CARE MEDICINE   Name: [name] MRN: [id number] DOB: 10-05-1945    ADMISSION DATE:  08/10/2015 CONSULTATION DATE:  08/14/2015  REFERRING MD:  [facility]  CHIEF COMPLAINT:  Altered mental status  SUBJECTIVE:  Got too sleepy again last night after getting compazine.  VITAL SIGNS: BP 154/96 mmHg  Pulse 107  Temp(Src) 98.4 F (36.9 C) (Oral)  Resp 21  Ht 5' (1.524 m)  Wt 121 lb 11.1 oz (55.2 kg)  BMI 23.77 kg/m2  SpO2 97%  INTAKE / OUTPUT: I/O last 3 completed shifts: In: 380 [P.O.:120; Other:110; IV Piggyback:150] Out: 900 [Urine:900]  PHYSICAL EXAMINATION: General:  Alert Neuro:  Follows commands, moves extremities HEENT:  Pupils reactive Cardiovascular:  Regular, tachycardic Lungs:  Basilar crackles Abdomen:  Soft, non tender Musculoskeletal:  No edema Skin:  No rashes  LABS:  BMET  Recent Labs Lab 08/12/15 1316 08/13/15 1920 08/15/15 0409  NA 138 142 138  K 4.6 3.9 4.0  CL 112* 109 112*  CO2 19* 21* 17*  BUN '9 9 13  '$ CREATININE 1.20* 1.20* 1.35*  GLUCOSE 244* 104* 70    Electrolytes  Recent Labs Lab 08/12/15 1316 08/13/15 1920 08/15/15 0409  CALCIUM 7.8* 9.0 8.1*  MG 1.1* 1.3*  --     CBC  Recent Labs Lab 08/11/15 0140 08/12/15 1316 08/15/15 0409  WBC 10.1 12.0* 11.8*  HGB 8.7* 9.6* 9.3*  HCT 27.1* 29.2* 29.0*  PLT 255 246 205    Coag's  Recent Labs Lab 08/10/15 1726  APTT 34  INR 1.15    Sepsis Markers  Recent Labs Lab 08/10/15 1647 08/10/15 1726 08/10/15 2130 08/11/15 0152  LATICACIDVEN 1.5  --  2.4* 1.7  PROCALCITON  --  <0.10  --   --     ABG No results for input(s): PHART, PCO2ART, PO2ART in the last 168 hours.  Liver Enzymes  Recent Labs Lab 08/10/15 1148 08/11/15 0140  AST 41 39  ALT 27 22  ALKPHOS 182* 122  BILITOT 0.5 0.5  ALBUMIN 2.1* 1.5*    Cardiac Enzymes No results for input(s): TROPONINI, PROBNP in the last 168 hours.  Glucose  Recent Labs Lab  08/14/15 0739 08/14/15 1135 08/14/15 1701 08/14/15 2027 08/14/15 2349 08/15/15 0324  GLUCAP 155* 149* 114* 120* 123* 71    Imaging Ct Head Wo Contrast  08/14/2015  CLINICAL DATA:  Unwitnessed fall. History of encephalopathy. Vomiting. EXAM: CT HEAD WITHOUT CONTRAST TECHNIQUE: Contiguous axial images were obtained from the base of the skull through the vertex without intravenous contrast. COMPARISON:  August 10, 2015 FINDINGS: FINDINGS The ventricles are normal in size and configuration. No mass, hemorrhage, extra-axial fluid collection, or midline shift is apparent. There is decreased attenuation throughout portions of the superior left temporal and anterior left occipital lobe compared to the normal-appearing right side, representing a change from the prior study. This appearance is concerning for acute infarct in the posterior temporal and anterior upper occipital lobes on the left. No other evidence of acute infarct. There is mild small vessel disease in the centra semiovale. The appearance of the calvarium suggests osteomalacia, stable. No fracture is evident. Mastoid air cells are clear. There is chronic remodeling of the right maxillary antrum. No intraorbital lesions are identified. IMPRESSION: Evidence of cytotoxic edema in the posterior temporal and anterior occipital lobes on the left. Acute infarct in this region is suspected. No hemorrhage.  No mass or extra-axial fluid collection. 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 Chronic  remodeling in the right maxillary sinus region. These results will be called to the ordering clinician or representative by the Radiologist Assistant, and communication documented in the PACS or zVision Dashboard. Electronically Signed   By: Lowella Grip III M.D.   On: 08/14/2015 11:21   Mr Brain Wo Contrast  08/14/2015  CLINICAL DATA:  Code stroke. Decreased responsiveness. Not following commands. EXAM: MRI HEAD WITHOUT CONTRAST TECHNIQUE:  Multiplanar, multiecho pulse sequences of the brain and surrounding structures were obtained without intravenous contrast. COMPARISON:  CT head without contrast from the same day. FINDINGS: Diffusion-weighted images demonstrate no acute or subacute infarction. No acute hemorrhage or mass lesion is present. Mild periventricular and subcortical T2 changes are evident bilaterally. Ventricles are proportionate to the degree of atrophy. The internal auditory canals are within normal limits. White matter changes extend into the brainstem. The cerebellum is unremarkable. Flow is present in the major intracranial arteries. Bilateral lens replacements are present. Globes and orbits are otherwise intact. Chronic right maxillary sinus disease is present. The remaining paranasal sinuses and the mastoid air cells are clear. The skullbase is within normal limits. Midline sagittal images are within normal limits apart from degenerative change in the upper cervical spine. IMPRESSION: 1. No acute intracranial abnormality or focal lesion to explain the patient's change in mental status. 2. The CT finding is likely artifactual was the patient was somewhat tilted in the scanner. 3. Mild atrophy and white matter disease likely reflects the sequela of chronic microvascular ischemia. 4. Spondylosis in the upper cervical spine at C3-4. 5. Chronic right maxillary sinus opacification. Electronically Signed   By: San Morelle M.D.   On: 08/14/2015 18:29   Dg Chest Port 1 View  08/15/2015  CLINICAL DATA:  Patient with history of aspiration pneumonia. EXAM: PORTABLE CHEST 1 VIEW COMPARISON:  Chest radiograph 08/14/2015. FINDINGS: The inferior aspect of the chest is not included on current evaluation. Monitoring leads overlie the patient. Stable cardiac and mediastinal contours. Persistent small layering bilateral pleural effusions with underlying pulmonary consolidation. IMPRESSION: Layering bilateral pleural effusions and  underlying opacities may represent atelectasis, aspiration and infection. Electronically Signed   By: Lovey Newcomer M.D.   On: 08/15/2015 07:58   Dg Chest Port 1 View  08/14/2015  CLINICAL DATA:  Vomiting EXAM: PORTABLE CHEST 1 VIEW COMPARISON:  08/10/2015 FINDINGS: Progression of bibasilar airspace disease and bilateral effusions hearing Pulmonary vascularity is unchanged and felt to be within normal limits. No definite heart failure. Chronic fracture deformity of the proximal right humerus unchanged IMPRESSION: Progression of bibasilar airspace disease and small effusions. Possible aspiration pneumonia. Fluid overload is a less likely consideration. Electronically Signed   By: Franchot Gallo M.D.   On: 08/14/2015 10:15   Dg Abd Portable 1v  08/14/2015  CLINICAL DATA:  Vomiting EXAM: PORTABLE ABDOMEN - 1 VIEW COMPARISON:  05/11/2015 FINDINGS: Normal bowel gas pattern. Negative for bowel obstruction or ileus. Surgical bowel clips in the region of the cecum. Several small left renal calculi.  Possible right renal calculus. Bilateral iliac artery stents.  No acute skeletal abnormality. IMPRESSION: Normal bowel gas pattern. Electronically Signed   By: Franchot Gallo M.D.   On: 08/14/2015 10:39     STUDIES:  12/16 CT head >> cytotoxic edema Lt occipital lobe 12/16 MRI brain >> no acute abnormalities  CULTURES: 12/12 Blood >>  ANTIBIOTICS: 12/13 Primaxin >> 12/16 12/13 Levaquin >>  SIGNIFICANT EVENTS: 12/12 Admit  12/16 Transfer to ICU, neuro consulted 12/17 Back to floor, neuro s/o  LINES/TUBES:  DISCUSSION: 69 yo female admitted with altered mental status from sepsis with UTI, hypothermia, and hypoglycemia.  She developed vomiting, tachycardia, hypertension, and altered mental status.  CT head 12/16 was concerning for acute CVA.  She was transferred to ICU, but regained mental status upon arrival to ICU.  Neurology assessment and MRI brain not consistent with any acute process.  Altered  mental status most likely related to sensitivity to medications >> phenergan, compazine.  She has hx of cirrhosis from ETOH, COPD, diastolic CHF.  ASSESSMENT / PLAN:  PULMONARY A: Acute hypoxic respiratory failure 2nd to aspiration pneumonia. Hx of COPD. P:   Oxygen to keep SpO2 > 92% F/u CXR intermittently PRN BDs  CARDIOVASCULAR A:  HTN with chronic diastolic CHF. Hx of HLD. P:  Coreg, ASA, catapres, zocor  RENAL A:   CKD stage 2. P:   Monitor renal fx, urine outpt, electrolytes  GASTROINTESTINAL A:   Hx of ETOH with cirrhosis. Nutrition. Nausea. P:   Resume diet Thiamine, folic acid Prn zofran Add lasix, aldactone 12/17  HEMATOLOGIC A:   Anemia of chronic disease. P:  F/u CBC SQ heparin for DVT prophylaxis  INFECTIOUS A:   Aspiration pneumonia. UTI. Hx of PCN allergy. P:   Changed to levaquin 12/16   ENDOCRINE A:   Hx of DM on insulin as outpt. Episodes of hypoglycemia on admission. P:   SSI  NEUROLOGIC A:   Acute encephalopathy 2nd to medications. P:   Neurology consulted Avoid sedatives/opiates Avoid phenergan/compazine  Transfer to floor bed 12/17 >> will ask [facility] to resume care 12/18 and PCCM sign off.  Chesley Mires, MD [facility] Pulmonary/Critical Care 08/15/2015, 8:38 AM Pager:  [phone] After 3pm call: [phone]

## 2015-08-15 NOTE — Progress Notes (Signed)
Report called and given to RN on 5N.

## 2015-08-16 LAB — BASIC METABOLIC PANEL
ANION GAP: 7 (ref 5–15)
BUN: 9 mg/dL (ref 6–20)
CALCIUM: 8.3 mg/dL — AB (ref 8.9–10.3)
CO2: 25 mmol/L (ref 22–32)
Chloride: 109 mmol/L (ref 101–111)
Creatinine, Ser: 1.26 mg/dL — ABNORMAL HIGH (ref 0.44–1.00)
GFR calc Af Amer: 48 mL/min — ABNORMAL LOW (ref >60)
GFR, EST NON AFRICAN AMERICAN: 42 mL/min — AB (ref >60)
GLUCOSE: 107 mg/dL — AB (ref 65–99)
Potassium: 3.8 mmol/L (ref 3.5–5.1)
SODIUM: 141 mmol/L (ref 135–145)

## 2015-08-16 LAB — CBC
HCT: 28.4 % — ABNORMAL LOW (ref 36.0–46.0)
Hemoglobin: 9.6 g/dL — ABNORMAL LOW (ref 12.0–15.0)
MCH: 28.2 pg (ref 26.0–34.0)
MCHC: 33.8 g/dL (ref 30.0–36.0)
MCV: 83.3 fL (ref 78.0–100.0)
PLATELETS: 292 10*3/uL (ref 150–400)
RBC: 3.41 MIL/uL — AB (ref 3.87–5.11)
RDW: 15.7 % — ABNORMAL HIGH (ref 11.5–15.5)
WBC: 10.8 10*3/uL — ABNORMAL HIGH (ref 4.0–10.5)

## 2015-08-16 LAB — GLUCOSE, CAPILLARY
GLUCOSE-CAPILLARY: 113 mg/dL — AB (ref 65–99)
GLUCOSE-CAPILLARY: 176 mg/dL — AB (ref 65–99)
GLUCOSE-CAPILLARY: 219 mg/dL — AB (ref 65–99)
Glucose-Capillary: 106 mg/dL — ABNORMAL HIGH (ref 65–99)
Glucose-Capillary: 180 mg/dL — ABNORMAL HIGH (ref 65–99)

## 2015-08-16 LAB — MAGNESIUM: Magnesium: 1.5 mg/dL — ABNORMAL LOW (ref 1.7–2.4)

## 2015-08-16 MED ORDER — MAGNESIUM SULFATE 2 GM/50ML IV SOLN
2.0000 g | Freq: Once | INTRAVENOUS | Status: AC
  Administered 2015-08-16: 2 g via INTRAVENOUS
  Filled 2015-08-16: qty 50

## 2015-08-16 NOTE — Progress Notes (Addendum)
PROGRESS NOTE    [name] BZJ:[id number] DOB: [birth year] DOA: 08/10/2015 PCP: Philis Fendt, MD  HPI/Brief narrative 68 year old female patient with history of alcoholic cirrhosis, GERD, chronic diastolic CHF, HTN, asthma, COPD, DM, apparently lives alone, ambulates with a cane, states that she was recently discharged home from SNF last Friday after being in SNF for rehabilitation from acute gastroenteritis, presented to ED on 08/10/15 with altered mental status and was found down in the hallway of her home by her caretaker. She was found to be hypoglycemic by EMS in the 60s, hypothermic 93.44F rectally and was admitted to stepdown unit for presumed UTI sepsis. She improved and was transferred to telemetry on 12/15. Overnight 12/15 had couple of episodes of emesis and on morning of 12/16 noted to be lethargic and altered. Workup with CT head confirmed acute CVA and chest x-ray showed possible aspiration pneumonia. Patient deteriorated and became hypoxic. Neurology and CCM consulted. Patient transferred to ICU under CCM care on 12/16 for close monitoring and management. Eventually stroke was ruled out by MRI. Her mental status changes were attributed to medications i.e. Phenergan. Patient's mental status recovered rapidly. She did not require intubation. She was transferred out to floor and [facility] service on 08/16/15.   Assessment/Plan:  Acute respiratory failure with hypoxia - Developed on 08/14/15 due to altered mental status from phenergan and aspiration pneumonia from nonbloody emesis overnight. - Due to concern for decompensation, CCM consulted and patient was transferred to ICU. She was initially felt to need intubation but improved dramatically. - Pulmonary toilet, IV antibiotics, bronchodilator and oxygen - Wean off of oxygen as tolerated to maintain saturations >92%.   Aspiration pneumonia - From nausea and vomiting. Antibiotics changed from IV Rocephin to levofloxacin per  pharmacy.  - Speech therapy evaluation 12/16 recommended dysphagia 2 diet and thin liquids and advancing per her preference is. Patient preferred soft diet.   Nausea and vomiting - Unclear etiology.? Related to new stroke. KUB unremarkable. Supportive treatment. - Patient states that she has a long history of intermittent nausea and vomiting-unclear etiology. Remote colonoscopy. Does not follow with GI. As per RN, had an episode of emesis yesterday and once this morning.? Gastroparesis.  - No Compazine or Phenergan secondary to significant mental status changes. When necessary Zofran.   Acute encephalopathy - On admission was secondary to UTI. This had resolved but recurred on 12/16. Noted to be lethargic early 12/16 morning and progressively became obtunded. All felt to be due to Phenergan.  - resolved.   Sepsis  (on admission) due to urinary tract infection - Treated per sepsis protocol - Empirically started on IV Fortaz which was deescalated to Rocephin on 12/13. Changed to oral Ceftin on 12/15 - Sepsis physiology resolved. Lactate normalized. - Blood cultures 2: Negative to date. Unfortunately no urine seems to have been sent on admission. - Antibiotics changed on 12/16 to IV Levofloxacin for aspiration pneumonia  Acute kidney injury on ? CKD - likely prerenal from dehydration and related to sepsis - CK normal. Follow BMP  Arthralgia - On 12/14, indicated pain in several large joints including bilateral shoulders, elbows,? Wrists-left side more than the right. No definitive acute arthritis on exam.? Chronic changes. - Uric acid normal. - X-rays of the left shoulder, elbow and wrist without acute findings. - Venous Dopplers of bilateral upper extremities: Negative for DVT. - Treat supportively with pain medications as needed and follow. Not convinced that she has a gout flare. - Pain controlled/seems to have even  resolved. Able to move above joints well without painful  distress.  Essential hypertension - Uncontrolled. After verifying home medications, home dose clonidine and carvedilol was resumed on 12/15. Persistent sinus tachycardia earlier in admission may have been secondary to clonidine withdrawal rebound.  - Better. Continue Carvedilol and Clonidine  DM 2 with Hypoglycemia  - Resolved. Continue SSI. Periodic fluctuations.  COPD - Stable  Right eye ecchymosis - Felt to be due to trauma on admission. CT head and neck without acute findings. Improved.  Chronic anemia - Baseline hemoglobin may be in the 8-9 range. Dropped from 11.3-8.7 is probably dilutional. Stable.  Hypomagnesemia - follow BMP.  Sinus tachycardia - May be multifactorial related to clonidine withdrawal rebound, aspiration pneumonia. Continue beta blockers and transdermal clonidine - Improved  Chronic diastolic CHF, alcoholic cirrhosis - Started on low-dose Lasix, Aldactone, vitamins.   DVT prophylaxis: Subcutaneous heparin  Code Status: Full Family Communication: None at bedside Disposition Plan: to be determined.  Consultants:  CCM  Neurology  Procedures:  None  Antibiotics:  IV Fortaz 12/12 times one dose  IV ciprofloxacin 12/12 times one dose  IV levofloxacin 12/12 times one dose  IV Rocephin 12/13 > 12/14  Oral Ceftin 12/15 >12/16  IV Levofloxacin  Subjective: Had an episode of nonbloody emesis yesterday and once this morning. Denies abdominal pain. Passing flatus. States that she had BM since admission. No dyspnea or chest pain.   Objective: Filed Vitals:   08/15/15 1400 08/15/15 1444 08/15/15 2117 08/16/15 0641  BP: 166/106 151/89 130/75 129/72  Pulse: 122 107 101 96  Temp:  98.2 F (36.8 C) 97.9 F (36.6 C) 97.5 F (36.4 C)  TempSrc:  Oral Oral Oral  Resp: '22 18 17 16  '$ Height:      Weight:      SpO2: 96% 99% 100% 99%    Intake/Output Summary (Last 24 hours) at 08/16/15 0759 Last data filed at 08/15/15 1700  Gross per 24 hour   Intake    310 ml  Output    600 ml  Net   -290 ml   Filed Weights   08/10/15 1707 08/13/15 1736 08/13/15 2002  Weight: 55.3 kg (121 lb 14.6 oz) 55.4 kg (122 lb 2.2 oz) 55.2 kg (121 lb 11.1 oz)     Exam:  General exam: pleasant elderly female lying comfortably propped up in bed.   HEENT: Pupils equally reacting to light and accommodation. Right periorbital ecchymosis is decreasing. Respiratory system: slightly diminished breath sounds at the bases but otherwise clear to auscultation. No increased work of breathing. Cardiovascular system: S1 & S2 heard, RRR. No JVD, murmurs, gallops, clicks or pedal edema. Gastrointestinal system: Abdomen is nondistended, soft and nontender. Normal bowel sounds heard. Central nervous system: alert and oriented 3. No focal neurological deficits. Extremities: Symmetric 5 x 5 power.Boggy swelling of bilateral dorsum of hand and fingers, left >right and no acute arthritic findings on exam of bilateral wrist, elbows or shoulders-this seems more from fluid retention & improving . Able to move both upper extremities without pain or discomfort.   Data Reviewed: Basic Metabolic Panel:  Recent Labs Lab 08/10/15 1148 08/11/15 0140 08/12/15 1316 08/13/15 1920 08/15/15 0409  NA 141 139 138 142 138  K 3.9 3.4* 4.6 3.9 4.0  CL 109 109 112* 109 112*  CO2 25 22 19* 21* 17*  GLUCOSE 89 183* 244* 104* 70  BUN '12 9 9 9 13  '$ CREATININE 1.32* 1.11* 1.20* 1.20* 1.35*  CALCIUM 8.8* 7.6*  7.8* 9.0 8.1*  MG  --   --  1.1* 1.3*  --    Liver Function Tests:  Recent Labs Lab 08/10/15 1148 08/11/15 0140  AST 41 39  ALT 27 22  ALKPHOS 182* 122  BILITOT 0.5 0.5  PROT 6.0* 4.4*  ALBUMIN 2.1* 1.5*   No results for input(s): LIPASE, AMYLASE in the last 168 hours. No results for input(s): AMMONIA in the last 168 hours. CBC:  Recent Labs Lab 08/10/15 1148 08/11/15 0140 08/12/15 1316 08/15/15 0409 08/16/15 0653  WBC 14.6* 10.1 12.0* 11.8* 10.8*  NEUTROABS  9.6*  --   --   --   --   HGB 11.3* 8.7* 9.6* 9.3* 9.6*  HCT 35.7* 27.1* 29.2* 29.0* 28.4*  MCV 86.7 85.0 85.6 86.6 83.3  PLT 331 255 246 205 292   Cardiac Enzymes:  Recent Labs Lab 08/11/15 0945  CKTOTAL 167   BNP (last 3 results) No results for input(s): PROBNP in the last 8760 hours. CBG:  Recent Labs Lab 08/15/15 1201 08/15/15 1650 08/15/15 2124 08/16/15 0135 08/16/15 0643  GLUCAP 75 123* 207* 113* 106*    Recent Results (from the past 240 hour(s))  Culture, blood (x 2)     Status: None   Collection Time: 08/10/15  7:30 PM  Result Value Ref Range Status   Specimen Description BLOOD RIGHT ANTECUBITAL  Final   Special Requests IN PEDIATRIC BOTTLE [facility]  Final   Culture NO GROWTH 5 DAYS  Final   Report Status 08/15/2015 FINAL  Final  Culture, blood (x 2)     Status: None   Collection Time: 08/10/15  7:40 PM  Result Value Ref Range Status   Specimen Description BLOOD RIGHT HAND  Final   Special Requests IN PEDIATRIC BOTTLE [facility]  Final   Culture NO GROWTH 5 DAYS  Final   Report Status 08/15/2015 FINAL  Final           Studies: Ct Head Wo Contrast  08/14/2015  CLINICAL DATA:  Unwitnessed fall. History of encephalopathy. Vomiting. EXAM: CT HEAD WITHOUT CONTRAST TECHNIQUE: Contiguous axial images were obtained from the base of the skull through the vertex without intravenous contrast. COMPARISON:  August 10, 2015 FINDINGS: FINDINGS The ventricles are normal in size and configuration. No mass, hemorrhage, extra-axial fluid collection, or midline shift is apparent. There is decreased attenuation throughout portions of the superior left temporal and anterior left occipital lobe compared to the normal-appearing right side, representing a change from the prior study. This appearance is concerning for acute infarct in the posterior temporal and anterior upper occipital lobes on the left. No other evidence of acute infarct. There is mild small vessel disease in the centra  semiovale. The appearance of the calvarium suggests osteomalacia, stable. No fracture is evident. Mastoid air cells are clear. There is chronic remodeling of the right maxillary antrum. No intraorbital lesions are identified. IMPRESSION: Evidence of cytotoxic edema in the posterior temporal and anterior occipital lobes on the left. Acute infarct in this region is suspected. No hemorrhage.  No mass or extra-axial fluid collection. 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 Chronic remodeling in the right maxillary sinus region. These results will be called to the ordering clinician or representative by the Radiologist Assistant, and communication documented in the PACS or zVision Dashboard. Electronically Signed   By: Lowella Grip III M.D.   On: 08/14/2015 11:21   Mr Brain Wo Contrast  08/14/2015  CLINICAL DATA:  Code stroke. Decreased responsiveness. Not  following commands. EXAM: MRI HEAD WITHOUT CONTRAST TECHNIQUE: Multiplanar, multiecho pulse sequences of the brain and surrounding structures were obtained without intravenous contrast. COMPARISON:  CT head without contrast from the same day. FINDINGS: Diffusion-weighted images demonstrate no acute or subacute infarction. No acute hemorrhage or mass lesion is present. Mild periventricular and subcortical T2 changes are evident bilaterally. Ventricles are proportionate to the degree of atrophy. The internal auditory canals are within normal limits. White matter changes extend into the brainstem. The cerebellum is unremarkable. Flow is present in the major intracranial arteries. Bilateral lens replacements are present. Globes and orbits are otherwise intact. Chronic right maxillary sinus disease is present. The remaining paranasal sinuses and the mastoid air cells are clear. The skullbase is within normal limits. Midline sagittal images are within normal limits apart from degenerative change in the upper cervical spine. IMPRESSION: 1. No acute  intracranial abnormality or focal lesion to explain the patient's change in mental status. 2. The CT finding is likely artifactual was the patient was somewhat tilted in the scanner. 3. Mild atrophy and white matter disease likely reflects the sequela of chronic microvascular ischemia. 4. Spondylosis in the upper cervical spine at C3-4. 5. Chronic right maxillary sinus opacification. Electronically Signed   By: San Morelle M.D.   On: 08/14/2015 18:29   Dg Chest Port 1 View  08/15/2015  CLINICAL DATA:  Patient with history of aspiration pneumonia. EXAM: PORTABLE CHEST 1 VIEW COMPARISON:  Chest radiograph 08/14/2015. FINDINGS: The inferior aspect of the chest is not included on current evaluation. Monitoring leads overlie the patient. Stable cardiac and mediastinal contours. Persistent small layering bilateral pleural effusions with underlying pulmonary consolidation. IMPRESSION: Layering bilateral pleural effusions and underlying opacities may represent atelectasis, aspiration and infection. Electronically Signed   By: Lovey Newcomer M.D.   On: 08/15/2015 07:58   Dg Chest Port 1 View  08/14/2015  CLINICAL DATA:  Vomiting EXAM: PORTABLE CHEST 1 VIEW COMPARISON:  08/10/2015 FINDINGS: Progression of bibasilar airspace disease and bilateral effusions hearing Pulmonary vascularity is unchanged and felt to be within normal limits. No definite heart failure. Chronic fracture deformity of the proximal right humerus unchanged IMPRESSION: Progression of bibasilar airspace disease and small effusions. Possible aspiration pneumonia. Fluid overload is a less likely consideration. Electronically Signed   By: Franchot Gallo M.D.   On: 08/14/2015 10:15   Dg Abd Portable 1v  08/14/2015  CLINICAL DATA:  Vomiting EXAM: PORTABLE ABDOMEN - 1 VIEW COMPARISON:  05/11/2015 FINDINGS: Normal bowel gas pattern. Negative for bowel obstruction or ileus. Surgical bowel clips in the region of the cecum. Several small left renal  calculi.  Possible right renal calculus. Bilateral iliac artery stents.  No acute skeletal abnormality. IMPRESSION: Normal bowel gas pattern. Electronically Signed   By: Franchot Gallo M.D.   On: 08/14/2015 10:39        Scheduled Meds: . antiseptic oral rinse  7 mL Mouth Rinse BID  . aspirin  325 mg Oral Daily  . carvedilol  12.5 mg Oral BID WC  . cloNIDine  0.1 mg Transdermal Weekly  . folic acid  1 mg Oral Daily  . furosemide  20 mg Oral Daily  . heparin  5,000 Units Subcutaneous 3 times per day  . insulin aspart  0-15 Units Subcutaneous TID WC  . insulin aspart  0-5 Units Subcutaneous QHS  . levofloxacin (LEVAQUIN) IV  750 mg Intravenous Q48H  . pantoprazole  40 mg Oral Q1200  . sodium chloride  3 mL Intravenous  Q12H  . spironolactone  12.5 mg Oral Daily  . thiamine  100 mg Oral Daily   Continuous Infusions:    Principal Problem:   Sepsis ([place]) Active Problems:   COPD (chronic obstructive pulmonary disease) (HCC)   PVD, LEIA/LIIA and RCIA pta 7/09- ABIs 0.68 and 0.69 Feb 2014   Acute encephalopathy   Essential hypertension   Gout   Hypoglycemia   Hypothermia   UTI (lower urinary tract infection)    Time spent: 30 minutes   Sarabi Sockwell, MD, FACP, FHM. [facility] Pager [phone]  If 7PM-7AM, please contact night-coverage [email or web address] Password [email or web address] 08/16/2015, 7:59 AM    LOS: 6 days

## 2015-08-17 ENCOUNTER — Encounter: Payer: Self-pay | Admitting: Adult Health

## 2015-08-17 DIAGNOSIS — E876 Hypokalemia: Secondary | ICD-10-CM

## 2015-08-17 LAB — BASIC METABOLIC PANEL
Anion gap: 11 (ref 5–15)
BUN: 7 mg/dL (ref 6–20)
CHLORIDE: 102 mmol/L (ref 101–111)
CO2: 26 mmol/L (ref 22–32)
CREATININE: 1.04 mg/dL — AB (ref 0.44–1.00)
Calcium: 8.2 mg/dL — ABNORMAL LOW (ref 8.9–10.3)
GFR calc non Af Amer: 53 mL/min — ABNORMAL LOW (ref >60)
GLUCOSE: 148 mg/dL — AB (ref 65–99)
Potassium: 3.1 mmol/L — ABNORMAL LOW (ref 3.5–5.1)
Sodium: 139 mmol/L (ref 135–145)

## 2015-08-17 LAB — GLUCOSE, CAPILLARY
GLUCOSE-CAPILLARY: 122 mg/dL — AB (ref 65–99)
GLUCOSE-CAPILLARY: 146 mg/dL — AB (ref 65–99)
GLUCOSE-CAPILLARY: 114 mg/dL — AB (ref 65–99)
Glucose-Capillary: 177 mg/dL — ABNORMAL HIGH (ref 65–99)
Glucose-Capillary: 98 mg/dL (ref 65–99)

## 2015-08-17 LAB — MAGNESIUM: Magnesium: 1.6 mg/dL — ABNORMAL LOW (ref 1.7–2.4)

## 2015-08-17 MED ORDER — MAGNESIUM OXIDE 400 (241.3 MG) MG PO TABS
400.0000 mg | ORAL_TABLET | Freq: Two times a day (BID) | ORAL | Status: DC
  Administered 2015-08-17 – 2015-08-18 (×3): 400 mg via ORAL
  Filled 2015-08-17 (×3): qty 1

## 2015-08-17 MED ORDER — LEVOFLOXACIN 500 MG PO TABS: 750.0000 mg | ORAL_TABLET | ORAL | Status: DC

## 2015-08-17 MED ORDER — CLONIDINE HCL 0.1 MG PO TABS
0.1000 mg | ORAL_TABLET | Freq: Three times a day (TID) | ORAL | Status: DC
  Administered 2015-08-17 – 2015-08-18 (×3): 0.1 mg via ORAL
  Filled 2015-08-17 (×4): qty 1

## 2015-08-17 MED ORDER — ONDANSETRON HCL 4 MG PO TABS: 4.0000 mg | ORAL_TABLET | Freq: Three times a day (TID) | ORAL | Status: DC | PRN

## 2015-08-17 MED ORDER — POTASSIUM CHLORIDE CRYS ER 20 MEQ PO TBCR
40.0000 meq | EXTENDED_RELEASE_TABLET | Freq: Once | ORAL | Status: AC
  Administered 2015-08-17: 40 meq via ORAL
  Filled 2015-08-17: qty 2

## 2015-08-17 MED ORDER — MAGNESIUM SULFATE 2 GM/50ML IV SOLN
2.0000 g | Freq: Once | INTRAVENOUS | Status: DC
  Administered 2015-08-17: 2 g via INTRAVENOUS
  Filled 2015-08-17: qty 50

## 2015-08-17 NOTE — Care Management Note (Signed)
Case Management Note  Patient Details  Name: [name] MRN: [id number] Date of Birth: 1946/06/12  Subjective/Objective:          Admitted with sepsis          Action/Plan: Spoke with patient who was transferred from [place] on 12/18, she states that she lives at [facility], [phone]. Ms Myer stated that she was at [facility] until 08/07/15 when she discharged to home with home therapy. She states that she uses a rollator and home O2 and her healthcare POA is Synthia Innocent [phone].Contacted Fleda Pagel with [facility] Go per previous CM note, Arville Go was set up for [facility] by [employer] but had not seen her yet.PT to see patient, will continue to follow for d/c needs.         Expected Discharge Date:                  Expected Discharge Plan:  [place]  In-House Referral:     Discharge planning Services  CM Consult  Post Acute Care Choice:    Choice offered to:     DME Arranged:    DME Agency:     HH Arranged:    HH Agency:  [facility]  Status of Service:  In process, will continue to follow  Medicare Important Message Given:  Yes Date Medicare IM Given:    Medicare IM give by:    Date Additional Medicare IM Given:    Additional Medicare Important Message give by:     If discussed at [place] of Stay Meetings, dates discussed:    Additional Comments:  Nila Nephew, RN 08/17/2015, 12:02 PM

## 2015-08-17 NOTE — Progress Notes (Signed)
Patient ID: [name], female   DOB: 1946-08-26, 69 y.o.   MRN: [id number]   Facility: [facility]      Allergies  Allergen Reactions  . Penicillins Hives    Has patient had a PCN reaction causing immediate rash, facial/tongue/throat swelling, SOB or lightheadedness with hypotension: No Has patient had a PCN reaction causing severe rash involving mucus membranes or skin necrosis: No Has patient had a PCN reaction that required hospitalization No Has patient had a PCN reaction occurring within the last 10 years: No If all of the above answers are "NO", then may proceed with Cephalosporin use.   . Lipitor [Atorvastatin] Swelling  . Compazine [Prochlorperazine Edisylate] Other (See Comments)    Altered mental status  . Phenergan [Promethazine Hcl] Other (See Comments)    Altered mental status    Chief Complaint  Patient presents with  . Acute Visit    right arm edema     HPI:  Staff reports that her right arm has more edema present. She is status post 2 units PRBC's. Her cbg's have been elevated. She is not voicing any complaints. She told me that she is feeling pretty good today.    Past Medical History  Diagnosis Date  . Alcoholic cirrhosis ([place])   . Reflux esophagitis   . Gastroesophageal reflux   . Colon polyps   . CHF (congestive heart failure) ([place]) 10/13    grade 1 diastolic dysfunction, Nl LVF  . Hypertension   . History of GI diverticular bleed   . Pneumonia     history of  . Asthma   . Anemia   . Blood transfusion few years ago  . Villous adenoma of colon 05/10/11  . PVD (peripheral vascular disease) ([place]) 2009    bilat iliac stenting  . Chronic obstructive pulmonary disease (COPD) ([place])   . Arthritis   . Glaucoma     left  . Gout   . Acute venous embolism and thrombosis of deep vessels of proximal lower extremity ([place])   . Back pain   . Leg swelling   . Diabetes mellitus     insulin dep   . Hepatitis     alcoholic hepatitis  . Depression   .  Anxiety   . On home oxygen therapy oxygen 2 liter per minute prn per [place]  . Cellulitis     bilateral lower extremities  . Chronic diastolic congestive heart failure [facility])     Past Surgical History  Procedure Laterality Date  . Cataract extraction Right yrs ago  . Colonoscopy    . Laparoscopic assissted total colectomy w/ j-pouch  04/22/11  . Ventral hernia repair  06/13/2012    Procedure: LAPAROSCOPIC VENTRAL HERNIA;  Surgeon: Adin Hector, MD;  Location: [facility];  Service: General;  Laterality: N/A;  Laparoscopic Assisted Ventral Hernia with Mesh  . Abdominal hysterectomy  yrs ago  . Esophagogastroduodenoscopy (egd) with propofol N/A 08/06/2013    Procedure: ESOPHAGOGASTRODUODENOSCOPY (EGD) WITH PROPOFOL;  Surgeon: Lear Ng, MD;  Location: [facility];  Service: Endoscopy;  Laterality: N/A;  . Colonoscopy with propofol N/A 08/06/2013    Procedure: COLONOSCOPY WITH PROPOFOL;  Surgeon: Lear Ng, MD;  Location: [facility];  Service: Endoscopy;  Laterality: N/A;    VITAL SIGNS BP 147/80 mmHg  Pulse 80  Ht '5\' 3"'  (1.6 m)  Wt 124 lb 6.4 oz (56.427 kg)  BMI 22.04 kg/m2  SpO2 95%  Patient's Medications  New Prescriptions   No medications  on file  Previous Medications   ALBUTEROL (PROVENTIL HFA;VENTOLIN HFA) 108 (90 BASE) MCG/ACT INHALER    Inhale 2 puffs into the lungs every 6 (six) hours as needed. For shortness of breath   ALBUTEROL (PROVENTIL) (2.5 MG/3ML) 0.083% NEBULIZER SOLUTION    Take 2.5 mg by nebulization every 6 (six) hours as needed for wheezing or shortness of breath.   ALLOPURINOL (ZYLOPRIM) 100 MG TABLET    Take 100 mg by mouth 2 (two) times daily.   ASPIRIN 81 MG EC TABLET    Take 81 mg by mouth daily.     CARVEDILOL (COREG) 12.5 MG TABLET    Take 12.5 mg by mouth 2 (two) times daily with a meal.   CILOSTAZOL (PLETAL) 100 MG TABLET    Take 50 mg by mouth 2 (two) times daily before a meal.    CLONIDINE (CATAPRES) 0.1 MG TABLET    Take 1 tablet (0.1  mg total) by mouth 3 (three) times daily.   ERGOCALCIFEROL (VITAMIN D2) 50000 UNITS CAPSULE    Take 50,000 Units by mouth every Monday. On Monday   FERROUS SULFATE 325 (65 FE) MG TABLET    Take 325 mg by mouth daily with breakfast.   FLUTICASONE (FLONASE) 50 MCG/ACT NASAL SPRAY    Place 2 sprays into the nose daily as needed for allergies.    GLUCOSE MONITORING KIT (FREESTYLE) MONITORING KIT    1 each by Does not apply route 4 (four) times daily - after meals and at bedtime. 1 month Diabetic Testing Supplies for QAC-QHS accuchecks.Any brand OK   INSULIN GLARGINE (LANTUS) 100 UNIT/ML INJECTION    Inject 5 Units into the skin at bedtime.   MAGNESIUM OXIDE (MAG-OX) 400 MG TABLET    Take 400 mg by mouth daily.   MONTELUKAST (SINGULAIR) 10 MG TABLET    Take 10 mg by mouth at bedtime.    OMEPRAZOLE (PRILOSEC) 40 MG CAPSULE    Take 40 mg by mouth daily.    OXYGEN-HELIUM IN    Inhale 2 L into the lungs at bedtime. 2 liters nightly   SIMVASTATIN (ZOCOR) 10 MG TABLET    Take 10 mg by mouth daily.    TRAMADOL (ULTRAM) 50 MG TABLET    Take 1 tablet (50 mg total) by mouth every 6 (six) hours as needed.  Modified Medications   No medications on file  Discontinued Medications   No medications on file     SIGNIFICANT DIAGNOSTIC EXAMS   02/2013: ABI: 0.68 and 0.69   05-09-15: ct of abdomen and pelvis: Calcifications in the pancreas consistent with chronic pancreatitis. Prominence the gastric wall may be due to thickening or normal under distention. Gastritis is not excluded. No evidence of bowel obstruction. Diverticulosis of sigmoid colon without evidence of diverticulitis. Inferior ventral abdominal wall hernia containing fat.  06-02-15: right shoulder x-ray: Impacted right  humeral neck fracture.  Osteopenia.   LABS REVIEWED:   05-10-15: hgb a1c 7.2 06-02-15; wbc 10.4; hgb 8.3; hct 25.8; mcv 85.1; plt 287; glucose 153; bun 20; creat 1.26; k+ 3.6; na++137; liver normal albumin 2.1  06-03-15: wbc 16.8;  hgb 9.5; hct 29.3; mcv 84.4; plt 365; glucose 198; bun 20; creat 1.04; k+ 3.6; na++137; liver normal albumin 2.4 06-08-15: wbc 8.6; hgb 6.4; hct 20.3; mcv 85.7; plt 289   06-12-15: wbc 10.5; hgb 9.6; hct 29; mcv 86; plt 324      Review of Systems  Constitutional: Negative for appetite change and fatigue.  HENT:  Negative for congestion.   Respiratory: Negative for cough, chest tightness and shortness of breath.   Cardiovascular: Negative for chest pain, palpitations and leg swelling.  Gastrointestinal: Negative for nausea, abdominal pain, diarrhea and constipation.  Musculoskeletal: Negative for myalgias and arthralgias.  Skin: Negative for pallor.  Neurological: Negative for dizziness.  Psychiatric/Behavioral: The patient is not nervous/anxious.     Physical Exam Constitutional: No distress.  Frail   Eyes: Conjunctivae are normal.  Neck: Neck supple. No JVD present. No thyromegaly present.  Cardiovascular: Normal rate and regular rhythm.   Bilateral lower extremities discolored due to pvd Pedal pulses very faint   Respiratory: Effort normal and breath sounds normal. No respiratory distress. She has no wheezes.  GI: Soft. Bowel sounds are normal. She exhibits no distension. There is no tenderness.  Musculoskeletal:  Has increased right upper extremity edema .  Right arm in sling Able to move all other extremities   Lymphadenopathy:    She has no cervical adenopathy.  Neurological: She is alert.  Skin: Skin is warm and dry. She is not diaphoretic.  Psychiatric: She has a normal mood and affect.      ASSESSMENT/ PLAN:  1. Right humeral fracture 2. Right arm edema 3. diabetes Will ensure that she has an orthopedic follow up Will get a venous doppler of her right upper extremity Will increase her lantus to 10 units nightly Will begin  her novolog  3 units with meals for cbg >=150        Ok Edwards NP [facility]  Contact [phone] Monday through  Friday 8am- 5pm  After hours call [phone]

## 2015-08-17 NOTE — Care Management Important Message (Signed)
Important Message  Patient Details  Name: [name] MRN: [id number] Date of Birth: 1946/07/25   Medicare Important Message Given:  Yes    [name] 08/17/2015, 3:35 PM

## 2015-08-17 NOTE — Evaluation (Signed)
Physical Therapy Evaluation Patient Details Name: [name] MRN: [id number] DOB: 12-06-1945 Today's Date: 08/17/2015   History of Present Illness  This 69 y.o. female admitted with AMS after being found down in the hallway of her home by caregiver.  Dx:  sepsis due to UTI, AKI, acute encephalopathy.  PMH includes:  HTN, DM , COPD, chronic anemia, alcoholic cirrhosis, GERD, chronic diastolic CHF, asthma   Clinical Impression  Pt functioning near baseline. Pt functioning at min guard/supervision level. Pt con't to benefit from acute PT to address generalized weakness, decreased activity tolerance, and impaired balance to progress independence and safety with mobility.    Follow Up Recommendations Home health PT;Supervision/Assistance - 24 hour    Equipment Recommendations  None recommended by PT    Recommendations for Other Services       Precautions / Restrictions Precautions Precautions: Fall Restrictions Weight Bearing Restrictions: No      Mobility  Bed Mobility Overal bed mobility: Modified Independent             General bed mobility comments: used bed rail but completed safe and without assist  Transfers Overall transfer level: Needs assistance   Transfers: Sit to/from Stand Sit to Stand: Supervision         General transfer comment: pt demo'd safe technique  Ambulation/Gait Ambulation/Gait assistance: Min guard Ambulation Distance (Feet): 150 Feet Assistive device: Rolling walker (2 wheeled) Gait Pattern/deviations: Step-through pattern Gait velocity: decreased Gait velocity interpretation: Below normal speed for age/gender General Gait Details: pt with good walker management, no episodes of LOB  Stairs            Wheelchair Mobility    Modified Rankin (Stroke Patients Only)       Balance Overall balance assessment: Needs assistance   Sitting balance-Leahy Scale: Good       Standing balance-Leahy Scale: Fair Standing balance  comment: able to stand statically without difficulty but requires RW for safe ambulation                             Pertinent Vitals/Pain Pain Assessment: No/denies pain    Home Living Family/patient expects to be discharged to:: Private residence Living Arrangements: Alone Available Help at Discharge: Personal care attendant Type of Home: Apartment Home Access: Level entry     Home Layout: One level Home Equipment: Walker - 2 wheels;Cane - single point;Walker - 4 wheels;Bedside commode;Shower seat;Cane - quad      Prior Function Level of Independence: Needs assistance   Gait / Transfers Assistance Needed: ambulates mod I with SPC or rollator  ADL's / Homemaking Assistance Needed: Pt reports she was performing ADLs at SNF, but hadn not been home long enough to determine how she was functioning at home   Comments: Pt reports she has an aide for 1.5 hours/day      Hand Dominance   Dominant Hand: Right    Extremity/Trunk Assessment   Upper Extremity Assessment: Overall WFL for tasks assessed           Lower Extremity Assessment: Overall WFL for tasks assessed      Cervical / Trunk Assessment: Normal  Communication   Communication: No difficulties  Cognition Arousal/Alertness: Awake/alert Behavior During Therapy: WFL for tasks assessed/performed Overall Cognitive Status: Within Functional Limits for tasks assessed                      General Comments General comments (skin  integrity, edema, etc.): pt able to sit EOB and don socks indep    Exercises        Assessment/Plan    PT Assessment Patient needs continued PT services  PT Diagnosis Difficulty walking;Generalized weakness;Acute pain   PT Problem List Decreased strength;Decreased activity tolerance;Decreased balance;Decreased mobility  PT Treatment Interventions Gait training;Stair training;Functional mobility training;Therapeutic activities;Therapeutic exercise;Balance training    PT Goals (Current goals can be found in the Care Plan section) Acute Rehab PT Goals Patient Stated Goal: to go home PT Goal Formulation: With patient Time For Goal Achievement: 08/24/15 Potential to Achieve Goals: Good    Frequency Min 3X/week   Barriers to discharge Decreased caregiver support only has a caregiver 1.5s a day M-F    Co-evaluation               End of Session Equipment Utilized During Treatment: Gait belt Activity Tolerance: Patient tolerated treatment well Patient left: in bed;with call bell/phone within reach;with bed alarm set Nurse Communication: Mobility status         Time: 1312-1340 PT Time Calculation (min) (ACUTE ONLY): 28 min   Charges:   PT Evaluation $Initial PT Evaluation Tier I: 1 Procedure PT Treatments $Gait Training: 8-22 mins   PT G CodesKingsley Callander 08/17/2015, 4:56 PM   Kittie Plater, PT, DPT Pager #: [phone] Office #: [phone]

## 2015-08-17 NOTE — Progress Notes (Signed)
PROGRESS NOTE    [name] ZJI:[id number] DOB: Jan 19, 1946 DOA: 08/10/2015 PCP: Philis Fendt, MD  HPI/Brief narrative 69 year old female patient with history of alcoholic cirrhosis, GERD, chronic diastolic CHF, HTN, asthma, COPD, DM, apparently lives alone, ambulates with a cane, states that she was recently discharged home from SNF last Friday after being in SNF for rehabilitation from acute gastroenteritis, presented to ED on 08/10/15 with altered mental status and was found down in the hallway of her home by her caretaker. She was found to be hypoglycemic by EMS in the 60s, hypothermic 93.30F rectally and was admitted to stepdown unit for presumed UTI sepsis. She improved and was transferred to telemetry on 12/15. Overnight 12/15 had couple of episodes of emesis and on morning of 12/16 noted to be lethargic and altered. Workup with CT head confirmed acute CVA and chest x-ray showed possible aspiration pneumonia. Patient deteriorated and became hypoxic. Neurology and CCM consulted. Patient transferred to ICU under CCM care on 12/16 for close monitoring and management. Eventually stroke was ruled out by MRI. Her mental status changes were attributed to medications i.e. Phenergan. Patient's mental status recovered rapidly. She did not require intubation. She was transferred out to floor and [facility] service on 08/16/15. Patient declining SNF and insists on going home. Possible discharge home on 12/20.   Assessment/Plan:  Acute respiratory failure with hypoxia - Developed on 08/14/15 due to altered mental status from phenergan and aspiration pneumonia from nonbloody emesis overnight. - Due to concern for decompensation, CCM consulted and patient was transferred to ICU. She was initially felt to need intubation but improved dramatically. - Pulmonary toilet, IV antibiotics, bronchodilator and oxygen - Wean off of oxygen as tolerated to maintain saturations >92%.   Aspiration pneumonia - From  nausea and vomiting. Antibiotics changed from IV Rocephin to levofloxacin per pharmacy.  - Speech therapy evaluation 12/16 recommended dysphagia 2 diet and thin liquids and advancing per her preference is. Patient preferred soft diet.  - Changed levofloxacin to PO. We will need follow-up chest x-ray in 3-4 weeks.  Nausea and vomiting - Unclear etiology. KUB unremarkable. Supportive treatment. - Patient stated that she has a long history of intermittent nausea and vomiting-unclear etiology. Remote colonoscopy. Does not follow with GI. As per RN, had an episode of emesis 12/17 and once 12/18 morning.? Gastroparesis.  - No Compazine or Phenergan secondary to significant mental status changes. When necessary Zofran. - No further nausea or vomiting in the last 24 hours. Had BM daily for 2 days. Tolerating soft diet.   Acute encephalopathy - On admission was secondary to UTI. This had resolved but recurred on 12/16. Noted to be lethargic early 12/16 morning and progressively became obtunded. All felt to be due to Phenergan.  - resolved.   Sepsis  (on admission) due to urinary tract infection - Treated per sepsis protocol - Empirically started on IV Fortaz which was deescalated to Rocephin on 12/13. Changed to oral Ceftin on 12/15 - Sepsis physiology resolved. Lactate normalized. - Blood cultures 2: Negative to date. Unfortunately no urine seems to have been sent on admission. - Antibiotics changed on 12/16 to IV Levofloxacin for aspiration pneumonia  Acute kidney injury on ? CKD - likely prerenal from dehydration and related to sepsis - CK normal. Follow BMP  Arthralgia - On 12/14, indicated pain in several large joints including bilateral shoulders, elbows,? Wrists-left side more than the right. No definitive acute arthritis on exam.? Chronic changes. - Uric acid normal. - X-rays of  the left shoulder, elbow and wrist without acute findings. - Venous Dopplers of bilateral upper extremities:  Negative for DVT. - Treat supportively with pain medications as needed and follow. Not convinced that she has a gout flare. - Pain controlled/seems to have even resolved. Able to move above joints well without painful distress.  Essential hypertension - Uncontrolled. After verifying home medications, home dose clonidine and carvedilol was resumed on 12/15. Persistent sinus tachycardia earlier in admission may have been secondary to clonidine withdrawal rebound.  - Better. Continue Carvedilol and Clonidine  DM 2 with Hypoglycemia  - Resolved. Continue SSI. Periodic fluctuations.  COPD - Stable  Right eye ecchymosis - Felt to be due to trauma on admission. CT head and neck without acute findings. Improved.  Chronic anemia - Baseline hemoglobin may be in the 8-9 range. Dropped from 11.3-8.7 is probably dilutional. Stable.  Hypomagnesemia/hypokalemia - Patient lost IV line and does not want to be stuck again. Changed IV magnesium to oral supplements. Oral potassium replacement.  Sinus tachycardia - May be multifactorial related to clonidine withdrawal rebound, aspiration pneumonia. Continue beta blockers and clonidine-change back to oral 12/19 - Improved  Chronic diastolic CHF, alcoholic cirrhosis - Started on low-dose Lasix, Aldactone, vitamins.   DVT prophylaxis: Subcutaneous heparin  Code Status: Full Family Communication: None at bedside Disposition Plan: Possible discharge home 12/20. Patient declines SNF and states that her healthcare power of attorney will be able to provide her with 24/7 supervision and assistance at home.  Consultants:  CCM  Neurology  Procedures:  None  Antibiotics:  IV Fortaz 12/12 times one dose  IV ciprofloxacin 12/12 times one dose  IV levofloxacin 12/12 times one dose  IV Rocephin 12/13 > 12/14  Oral Ceftin 12/15 >12/16  Levofloxacin >  Subjective: No nausea, vomiting or abdominal pain. Tolerating soft diet. Had BM daily for 2  days. Lost IV access and does not want to be stuck again.  Objective: Filed Vitals:   08/16/15 0641 08/16/15 1346 08/16/15 2018 08/17/15 0644  BP: 129/72 148/86 135/89 153/87  Pulse: 96 86 122 106  Temp: 97.5 F (36.4 C) 99.6 F (37.6 C) 99 F (37.2 C) 98.4 F (36.9 C)  TempSrc: Oral Oral Oral Oral  Resp: '16 17 16 15  '$ Height:      Weight:      SpO2: 99% 99% 97% 99%    Intake/Output Summary (Last 24 hours) at 08/17/15 1243 Last data filed at 08/16/15 1700  Gross per 24 hour  Intake    240 ml  Output      0 ml  Net    240 ml   Filed Weights   08/10/15 1707 08/13/15 1736 08/13/15 2002  Weight: 55.3 kg (121 lb 14.6 oz) 55.4 kg (122 lb 2.2 oz) 55.2 kg (121 lb 11.1 oz)     Exam:  General exam: pleasant elderly female lying comfortably sitting up in bed eating breakfast this morning .   HEENT: Pupils equally reacting to light and accommodation. Right periorbital ecchymosis is decreasing. Respiratory system: slightly diminished breath sounds at the bases but otherwise clear to auscultation. No increased work of breathing. Cardiovascular system: S1 & S2 heard, RRR. No JVD, murmurs, gallops, clicks or pedal edema. non-telemetry.  Gastrointestinal system: Abdomen is nondistended, soft and nontender. Normal bowel sounds heard. Central nervous system: alert and oriented 3. No focal neurological deficits. Extremities: Symmetric 5 x 5 power.Boggy swelling of bilateral dorsum of hand and fingers, left >right and no acute arthritic findings  on exam of bilateral wrist, elbows or shoulders-this seems more from fluid retention & improving . Able to move both upper extremities without pain or discomfort.   Data Reviewed: Basic Metabolic Panel:  Recent Labs Lab 08/12/15 1316 08/13/15 1920 08/15/15 0409 08/16/15 0653 08/17/15 0524  NA 138 142 138 141 139  K 4.6 3.9 4.0 3.8 3.1*  CL 112* 109 112* 109 102  CO2 19* 21* 17* 25 26  GLUCOSE 244* 104* 70 107* 148*  BUN '9 9 13 9 7    '$ CREATININE 1.20* 1.20* 1.35* 1.26* 1.04*  CALCIUM 7.8* 9.0 8.1* 8.3* 8.2*  MG 1.1* 1.3*  --  1.5* 1.6*   Liver Function Tests:  Recent Labs Lab 08/11/15 0140  AST 39  ALT 22  ALKPHOS 122  BILITOT 0.5  PROT 4.4*  ALBUMIN 1.5*   No results for input(s): LIPASE, AMYLASE in the last 168 hours. No results for input(s): AMMONIA in the last 168 hours. CBC:  Recent Labs Lab 08/11/15 0140 08/12/15 1316 08/15/15 0409 08/16/15 0653  WBC 10.1 12.0* 11.8* 10.8*  HGB 8.7* 9.6* 9.3* 9.6*  HCT 27.1* 29.2* 29.0* 28.4*  MCV 85.0 85.6 86.6 83.3  PLT 255 246 205 292   Cardiac Enzymes:  Recent Labs Lab 08/11/15 0945  CKTOTAL 167   BNP (last 3 results) No results for input(s): PROBNP in the last 8760 hours. CBG:  Recent Labs Lab 08/16/15 1128 08/16/15 1632 08/16/15 2106 08/17/15 0636 08/17/15 1106  GLUCAP 180* 176* 219* 122* 146*    Recent Results (from the past 240 hour(s))  Culture, blood (x 2)     Status: None   Collection Time: 08/10/15  7:30 PM  Result Value Ref Range Status   Specimen Description BLOOD RIGHT ANTECUBITAL  Final   Special Requests IN PEDIATRIC BOTTLE 2CC  Final   Culture NO GROWTH 5 DAYS  Final   Report Status 08/15/2015 FINAL  Final  Culture, blood (x 2)     Status: None   Collection Time: 08/10/15  7:40 PM  Result Value Ref Range Status   Specimen Description BLOOD RIGHT HAND  Final   Special Requests IN PEDIATRIC BOTTLE 2CC  Final   Culture NO GROWTH 5 DAYS  Final   Report Status 08/15/2015 FINAL  Final           Studies: No results found.      Scheduled Meds: . antiseptic oral rinse  7 mL Mouth Rinse BID  . aspirin  325 mg Oral Daily  . carvedilol  12.5 mg Oral BID WC  . cloNIDine  0.1 mg Oral TID  . folic acid  1 mg Oral Daily  . furosemide  20 mg Oral Daily  . heparin  5,000 Units Subcutaneous 3 times per day  . insulin aspart  0-15 Units Subcutaneous TID WC  . insulin aspart  0-5 Units Subcutaneous QHS  . [START ON  08/18/2015] levofloxacin  750 mg Oral Q48H  . magnesium oxide  400 mg Oral BID  . pantoprazole  40 mg Oral Q1200  . sodium chloride  3 mL Intravenous Q12H  . spironolactone  12.5 mg Oral Daily  . thiamine  100 mg Oral Daily   Continuous Infusions:    Principal Problem:   Sepsis ([place]) Active Problems:   COPD (chronic obstructive pulmonary disease) ([facility])   PVD, LEIA/LIIA and RCIA pta 7/09- ABIs 0.68 and 0.69 Feb 2014   Acute encephalopathy   Essential hypertension   Gout   Hypoglycemia  Hypothermia   UTI (lower urinary tract infection)    Time spent: 30 minutes   Raina Sole, MD, FACP, FHM. [facility] Pager [phone]  If 7PM-7AM, please contact night-coverage [email or web address] Password [email or web address] 08/17/2015, 12:43 PM    LOS: 7 days

## 2015-08-18 LAB — BASIC METABOLIC PANEL
ANION GAP: 10 (ref 5–15)
BUN: 8 mg/dL (ref 6–20)
CALCIUM: 8.4 mg/dL — AB (ref 8.9–10.3)
CHLORIDE: 101 mmol/L (ref 101–111)
CO2: 27 mmol/L (ref 22–32)
Creatinine, Ser: 1.25 mg/dL — ABNORMAL HIGH (ref 0.44–1.00)
GFR calc non Af Amer: 42 mL/min — ABNORMAL LOW (ref >60)
GFR, EST AFRICAN AMERICAN: 49 mL/min — AB (ref >60)
GLUCOSE: 281 mg/dL — AB (ref 65–99)
POTASSIUM: 3.8 mmol/L (ref 3.5–5.1)
Sodium: 138 mmol/L (ref 135–145)

## 2015-08-18 LAB — GLUCOSE, CAPILLARY
GLUCOSE-CAPILLARY: 253 mg/dL — AB (ref 65–99)
Glucose-Capillary: 119 mg/dL — ABNORMAL HIGH (ref 65–99)

## 2015-08-18 MED ORDER — LEVOFLOXACIN 750 MG PO TABS: 750.0000 mg | ORAL_TABLET | Freq: Once | ORAL | Status: DC

## 2015-08-18 MED ORDER — FUROSEMIDE 20 MG PO TABS: 20.0000 mg | ORAL_TABLET | Freq: Every day | ORAL | Status: DC

## 2015-08-18 MED ORDER — FOLIC ACID 1 MG PO TABS: 1.0000 mg | ORAL_TABLET | Freq: Every day | ORAL | Status: AC

## 2015-08-18 MED ORDER — LEVOFLOXACIN 500 MG PO TABS
750.0000 mg | ORAL_TABLET | ORAL | Status: DC
  Administered 2015-08-18: 750 mg via ORAL
  Filled 2015-08-18: qty 2

## 2015-08-18 MED ORDER — FOLIC ACID 1 MG PO TABS: 1.0000 mg | ORAL_TABLET | Freq: Every day | ORAL | Status: DC

## 2015-08-18 MED ORDER — WHITE PETROLATUM GEL
Status: AC
  Administered 2015-08-18: 0.2
  Filled 2015-08-18: qty 1

## 2015-08-18 MED ORDER — SPIRONOLACTONE 25 MG PO TABS: 12.5000 mg | ORAL_TABLET | Freq: Every day | ORAL | Status: DC

## 2015-08-18 MED ORDER — THIAMINE HCL 100 MG PO TABS: 100.0000 mg | ORAL_TABLET | Freq: Every day | ORAL | Status: DC

## 2015-08-18 MED ORDER — THIAMINE HCL 100 MG PO TABS: 100.0000 mg | ORAL_TABLET | Freq: Every day | ORAL | Status: AC

## 2015-08-18 NOTE — Progress Notes (Signed)
Pt declined AM this at this time. Asked lab tech to reschedule for 0800. Will re-attempt labs at that time. Will update oncoming nursing staff.

## 2015-08-18 NOTE — Evaluation (Signed)
Occupational Therapy Evaluation Patient Details Name: [name] MRN: [id number] DOB: 13-Aug-1946 Today's Date: 08/18/2015    History of Present Illness This 69 y.o. female admitted with AMS after being found down in the hallway of her home by caregiver.  Dx:  sepsis due to UTI, AKI, acute encephalopathy.  PMH includes:  HTN, DM , COPD, chronic anemia, alcoholic cirrhosis, GERD, chronic diastolic CHF, asthma    Clinical Impression   Pt reports she was independent with ADLs PTA. Currently pt is overall at a close supervision level for ADLs and functional mobility. Educated on home safety and need for 24/7 supervision. Pt planning to d/c home with intermittent supervision from her POA and a home health aide for 1.5 hours per day. Pt declining SNF at this time, therefore recommending that pt have 24/7 supervision upon d/c home for safety. Pt would benefit from skilled OT in order to achieve mod I level for ADLs and functional mobility to allow for safe d/c home.    Follow Up Recommendations  No OT follow up;Supervision/Assistance - 24 hour    Equipment Recommendations  None recommended by OT    Recommendations for Other Services       Precautions / Restrictions Precautions Precautions: Fall Restrictions Weight Bearing Restrictions: No      Mobility Bed Mobility Overal bed mobility: Modified Independent                Transfers Overall transfer level: Needs assistance Equipment used: Rolling walker (2 wheeled) Transfers: Sit to/from Stand Sit to Stand: Supervision         General transfer comment: Good hand placement and technique. Supervision for safety    Balance Overall balance assessment: Needs assistance Sitting-balance support: Feet supported Sitting balance-Leahy Scale: Good     Standing balance support: No upper extremity supported;During functional activity Standing balance-Leahy Scale: Fair Standing balance comment: Able to stand at sink and  wash hands without UE support                            ADL Overall ADL's : Needs assistance/impaired Eating/Feeding: Independent   Grooming: Wash/dry hands;Supervision/safety;Standing   Upper Body Bathing: Supervision/ safety;Sitting   Lower Body Bathing: Supervison/ safety;Sit to/from stand   Upper Body Dressing : Supervision/safety;Sitting   Lower Body Dressing: Supervision/safety;Sit to/from stand Lower Body Dressing Details (indicate cue type and reason): Pt able to don/doff socks Toilet Transfer: Supervision/safety;Ambulation;Comfort height toilet;RW;Grab bars   Toileting- Clothing Manipulation and Hygiene: Supervision/safety;Sitting/lateral lean;Sit to/from stand       Functional mobility during ADLs: Supervision/safety;Rolling walker General ADL Comments: No family present for OT eval. Pt overall functioning at a supervision level for ADLs and functional mobility. Pt reports that her POA cannot provide 24/7 supervision and she is refusing SNF at this time.      Vision     Perception     Praxis      Pertinent Vitals/Pain Pain Assessment: No/denies pain     Hand Dominance Right   Extremity/Trunk Assessment Upper Extremity Assessment Upper Extremity Assessment: Overall WFL for tasks assessed   Lower Extremity Assessment Lower Extremity Assessment: Defer to PT evaluation   Cervical / Trunk Assessment Cervical / Trunk Assessment: Normal   Communication Communication Communication: No difficulties   Cognition Arousal/Alertness: Awake/alert Behavior During Therapy: WFL for tasks assessed/performed Overall Cognitive Status: Within Functional Limits for tasks assessed  General Comments       Exercises       Shoulder Instructions      Home Living Family/patient expects to be discharged to:: Private residence Living Arrangements: Alone Available Help at Discharge: Personal care attendant (only for 1.5 hours per  day) Type of Home: Apartment Home Access: Level entry     Home Layout: One level     Bathroom Shower/Tub: Tub/shower unit Shower/tub characteristics: Architectural technologist: Standard Bathroom Accessibility: Yes How Accessible: Accessible via walker Home Equipment: [place] - 2 wheels;Cane - single point;Walker - 4 wheels;Bedside commode;Cane - quad;Tub bench          Prior Functioning/Environment Level of Independence: Needs assistance  Gait / Transfers Assistance Needed: ambulates mod I with SPC or rollator ADL's / Homemaking Assistance Needed: Pt reports she was performing ADLs at SNF, but hadn not been home long enough to determine how she was functioning at home    Comments: Pt reports she has an aide for 1.5 hours/day     OT Diagnosis: Generalized weakness   OT Problem List: Decreased strength;Impaired balance (sitting and/or standing);Decreased safety awareness;Decreased knowledge of use of DME or AE   OT Treatment/Interventions: Self-care/ADL training;DME and/or AE instruction;Patient/family education    OT Goals(Current goals can be found in the care plan section) Acute Rehab OT Goals Patient Stated Goal: to go home today OT Goal Formulation: With patient Time For Goal Achievement: 09/01/15 Potential to Achieve Goals: Good ADL Goals Pt Will Perform Grooming: with modified independence;standing Pt Will Perform Upper Body Bathing: sitting;with set-up Pt Will Perform Lower Body Bathing: with modified independence;sit to/from stand Pt Will Perform Upper Body Dressing: with set-up;sitting Pt Will Perform Lower Body Dressing: with modified independence;sit to/from stand Pt Will Transfer to Toilet: with modified independence;ambulating;regular height toilet;grab bars Pt Will Perform Toileting - Clothing Manipulation and hygiene: with modified independence;sit to/from stand  OT Frequency: Min 2X/week   Barriers to D/C: Decreased caregiver support  POA works during the  day and can only provide supervision after ~5pm       Co-evaluation              End of Session Equipment Utilized During Treatment: Rolling walker  Activity Tolerance: Patient tolerated treatment well Patient left: in bed;with call bell/phone within reach   Time: 0952-1019 OT Time Calculation (min): 27 min Charges:  OT General Charges $OT Visit: 1 Procedure OT Evaluation $OT Re-eval: 1 Procedure OT Treatments $Self Care/Home Management : 8-22 mins G-Codes:     Binnie Kand M.S., OTR/L Pager: [phone]  08/18/2015, 11:47 AM

## 2015-08-18 NOTE — Progress Notes (Signed)
Spoke with patient about discharge plan, explained to patient that PT/OT recommending that she have supervision 24/7. Patient stated that she is not going to go to a SNF, she gave me permission to call her HPOA  Rudell Cobb [phone]. Called Ms Pinnex in the presence of the patient and informed her of recommendation for 24/7 supervision, she stated that patient refuses to go to a assisted living. She stated that the patient has an aide 1-2hrs /day from [facility] and Ms Pinnex checks on the patient every evening. Ms Pinnex verified that patient has a rollator that she uses.I informed Ms Pinnex that patient will have HHPT, RN and social worker visits from [place].Contacted Javarri Segal at [place] and set up [facility], [place] and Education officer, museum.

## 2015-08-18 NOTE — Progress Notes (Signed)
O2 sat 98% on room air

## 2015-08-18 NOTE — Discharge Summary (Signed)
Physician Discharge Summary  [name] TIR:[id number] DOB: October 22, 1945 DOA: 08/10/2015  PCP: Philis Fendt, MD  Admit date: 08/10/2015 Discharge date: 08/18/2015  Time spent: Greater than 30 minutes  Recommendations for Outpatient Follow-up:  1. Dr. Nolene Ebbs, PCP in 5 days with repeat labs (CBC & CMP). 2. Recommend follow-up chest x-ray in 3-4 weeks to ensure resolution of pneumonia findings. 3. Home health PT, RN and social worker  Discharge Diagnoses:  Principal Problem:   Sepsis ([name]) Active Problems:   COPD (chronic obstructive pulmonary disease) ([name])   PVD, LEIA/LIIA and RCIA pta 7/09- ABIs 0.68 and 0.69 Feb 2014   Acute encephalopathy   Essential hypertension   Gout   Hypoglycemia   Hypothermia   UTI (lower urinary tract infection)   Discharge Condition: Improved & Stable  Diet recommendation: Soft diet  Filed Weights   08/10/15 1707 08/13/15 1736 08/13/15 2002  Weight: 55.3 kg (121 lb 14.6 oz) 55.4 kg (122 lb 2.2 oz) 55.2 kg (121 lb 11.1 oz)    History of present illness:  69 year old female patient with history of alcoholic cirrhosis, GERD, chronic diastolic CHF, HTN, asthma, COPD, DM, apparently lives alone, ambulates with a cane, states that she was recently discharged home from SNF last Friday after being in SNF for rehabilitation from acute gastroenteritis, presented to ED on 08/10/15 with altered mental status and was found down in the hallway of her home by her caretaker. She was found to be hypoglycemic by EMS in the 60s, hypothermic 93.68F rectally and was admitted to stepdown unit for presumed UTI sepsis. She improved and was transferred to telemetry on 12/15. Overnight 12/15 had couple of episodes of emesis and on morning of 12/16 noted to be lethargic and altered. Workup with CT head confirmed acute CVA and chest x-ray showed possible aspiration pneumonia. Patient deteriorated and became hypoxic. Neurology and CCM consulted. Patient transferred to  ICU under CCM care on 12/16 for close monitoring and management. Eventually stroke was ruled out by MRI. Her mental status changes were attributed to medications i.e. Phenergan. Patient's mental status recovered rapidly. She did not require intubation. She was transferred out to floor and [facility] service on 08/16/15. Patient declining SNF and insists on going home.   Hospital Course:   Acute respiratory failure with hypoxia - Developed on 08/14/15 due to altered mental status from phenergan and aspiration pneumonia from nonbloody emesis overnight. - Due to concern for decompensation, CCM consulted and patient was transferred to ICU. She was initially felt to need intubation but improved dramatically. - Pulmonary toilet, IV antibiotics, bronchodilator and oxygen - Wean off of oxygen as tolerated to maintain saturations >92%.  - Resolved.  Aspiration pneumonia - From nausea and vomiting. Antibiotics changed from IV Rocephin to levofloxacin per pharmacy.  - Speech therapy evaluation 12/16 recommended dysphagia 2 diet and thin liquids and advancing per her preference is. Patient preferred soft diet.  - Changed levofloxacin to PO. We will need follow-up chest x-ray in 3-4 weeks.  Nausea and vomiting - Unclear etiology. KUB unremarkable. Supportive treatment. - Patient stated that she has a long history of intermittent nausea and vomiting-unclear etiology. Remote colonoscopy. Does not follow with GI. As per RN, had an episode of emesis 12/17 and once 12/18 morning.? Gastroparesis.  - No Compazine or Phenergan secondary to significant mental status changes-now listed as allergies. When necessary Zofran. - Resolved. Tolerating soft diet.   Acute encephalopathy - On admission was secondary to UTI. This had resolved but recurred on  12/16. Noted to be lethargic early 12/16 morning and progressively became obtunded. All felt to be due to Phenergan.  - resolved.   Sepsis (on admission) due to urinary  tract infection - Treated per sepsis protocol - Empirically started on IV Fortaz which was deescalated to Rocephin on 12/13. Changed to oral Ceftin on 12/15 - Sepsis physiology resolved. Lactate normalized. - Blood cultures 2: Negative to date. Unfortunately no urine seems to have been sent on admission. - Antibiotics changed on 12/16 to IV Levofloxacin for aspiration pneumonia - Completed treatment for this indication.  Acute kidney injury on ? Stage 3 CKD - likely prerenal from dehydration and related to sepsis - CK normal. Stable. Follow BMP in a few days as outpatient.  Arthralgia - On 12/14, indicated pain in several large joints including bilateral shoulders, elbows,? Wrists-left side more than the right. No definitive acute arthritis on exam.? Chronic changes. - Uric acid normal. - X-rays of the left shoulder, elbow and wrist without acute findings. - Venous Dopplers of bilateral upper extremities: Negative for DVT. - Treated supportively with pain medications -no further pain reported.. - Able to move above joints well without painful distress.  Essential hypertension - Continue Carvedilol and Clonidine. Reasonably controlled.   DM 2 with Hypoglycemia  - hypoglycemia resolved. Continue home dose of insulin's.   COPD - Stable  Right eye ecchymosis - Felt to be due to trauma on admission. CT head and neck without acute findings. Improved.  Chronic anemia - Baseline hemoglobin may be in the 8-9 range. Dropped from 11.3-8.7 is probably dilutional. Stable.  Hypomagnesemia/hypokalemia - Hypokalemia resolved. Mild hypomagnesemia-continue home dose of oral supplements and outpatient follow-up.   Sinus tachycardia - May be multifactorial related to clonidine withdrawal rebound, aspiration pneumonia. Continue beta blockers and clonidine-change back to oral 12/19 - resolved.   Chronic diastolic CHF, alcoholic cirrhosis - Started on low-dose Lasix, Aldactone,  vitamins.  Alcohol abuse - per friend, [name], continues to drink alcohol - did even prior to recent SNF admission  Tobacco - Cessation counseled.  Chronic Hypoxic Respiratory Failure - uses nightly oxygen at home.    Consultants:  CCM  Neurology  Procedures:  None  Antibiotics:  IV Fortaz 12/12 times one dose  IV ciprofloxacin 12/12 times one dose  IV levofloxacin 12/12 times one dose  IV Rocephin 12/13 > 12/14  Oral Ceftin 12/15 >12/16  Levofloxacin > 12/22   Discharge Exam:  Complaints:  denies complaints. Anxious to go home. Refuses SNF.   Filed Vitals:   08/17/15 2309 08/18/15 0527 08/18/15 0830 08/18/15 1108  BP: 116/66 131/75 113/75 112/75  Pulse:  86    Temp:  98.4 F (36.9 C)    TempSrc:  Oral    Resp:  15    Height:      Weight:      SpO2:  100%      General exam: pleasant elderly female sitting up comfortably in chair this morning.  HEENT: Pupils equally reacting to light and accommodation. Right periorbital ecchymosis is decreasing. Respiratory system: clear to auscultation. No increased work of breathing. Cardiovascular system: S1 & S2 heard, RRR. No JVD, murmurs, gallops, clicks or pedal edema. non-telemetry.  Gastrointestinal system: Abdomen is nondistended, soft and nontender. Normal bowel sounds heard. Central nervous system: alert and oriented 3. No focal neurological deficits. Extremities: Symmetric 5 x 5 power. Boggy swelling bilateral cubital foci area right >: Combination of IV infiltration in right arm and fluid retention. Improving. No acute  signs.   Discharge Instructions      Discharge Instructions    Call MD for:  difficulty breathing, headache or visual disturbances    Complete by:  As directed      Call MD for:  extreme fatigue    Complete by:  As directed      Call MD for:  persistant dizziness or light-headedness    Complete by:  As directed      Call MD for:  persistant nausea and vomiting    Complete  by:  As directed      Call MD for:  severe uncontrolled pain    Complete by:  As directed      Call MD for:  temperature >100.4    Complete by:  As directed      Call MD for:    Complete by:  As directed   Confusion or altered mental status.     Discharge instructions    Complete by:  As directed   DIET: soft diabetic diet.     Increase activity slowly    Complete by:  As directed             Medication List    TAKE these medications        albuterol 108 (90 BASE) MCG/ACT inhaler  Commonly known as:  PROVENTIL HFA;VENTOLIN HFA  Inhale 2 puffs into the lungs every 6 (six) hours as needed. For shortness of breath     albuterol (2.5 MG/3ML) 0.083% nebulizer solution  Commonly known as:  PROVENTIL  Take 2.5 mg by nebulization every 6 (six) hours as needed for wheezing or shortness of breath.     allopurinol 100 MG tablet  Commonly known as:  ZYLOPRIM  Take 100 mg by mouth 2 (two) times daily.     aspirin 81 MG EC tablet  Take 81 mg by mouth daily.     carvedilol 12.5 MG tablet  Commonly known as:  COREG  Take 12.5 mg by mouth 2 (two) times daily with a meal.     cilostazol 100 MG tablet  Commonly known as:  PLETAL  Take 50 mg by mouth 2 (two) times daily before a meal.     cloNIDine 0.1 MG tablet  Commonly known as:  CATAPRES  Take 1 tablet (0.1 mg total) by mouth 3 (three) times daily.     ergocalciferol 50000 UNITS capsule  Commonly known as:  VITAMIN D2  Take 50,000 Units by mouth every Monday. On Monday     ferrous sulfate 325 (65 FE) MG tablet  Take 325 mg by mouth daily with breakfast.     fluticasone 50 MCG/ACT nasal spray  Commonly known as:  FLONASE  Place 2 sprays into the nose daily as needed for allergies.     folic acid 1 MG tablet  Commonly known as:  FOLVITE  Take 1 tablet (1 mg total) by mouth daily.     furosemide 20 MG tablet  Commonly known as:  LASIX  Take 1 tablet (20 mg total) by mouth daily.     glucose monitoring kit monitoring  kit  1 each by Does not apply route 4 (four) times daily - after meals and at bedtime. 1 month Diabetic Testing Supplies for QAC-QHS accuchecks.Any brand OK     insulin glargine 100 UNIT/ML injection  Commonly known as:  LANTUS  Inject 5 Units into the skin at bedtime.     insulin lispro 100 UNIT/ML injection  Commonly known  as:  HUMALOG  Inject 5 Units into the skin 2 (two) times daily before lunch and supper.     levofloxacin 750 MG tablet  Commonly known as:  LEVAQUIN  Take 1 tablet (750 mg total) by mouth once. Last dose on 08/20/2015.     magnesium oxide 400 MG tablet  Commonly known as:  MAG-OX  Take 400 mg by mouth daily.     montelukast 10 MG tablet  Commonly known as:  SINGULAIR  Take 10 mg by mouth at bedtime.     omeprazole 40 MG capsule  Commonly known as:  PRILOSEC  Take 40 mg by mouth daily.     OXYGEN  Inhale 2 L into the lungs at bedtime. 2 liters nightly     simvastatin 10 MG tablet  Commonly known as:  ZOCOR  Take 10 mg by mouth daily.     spironolactone 25 MG tablet  Commonly known as:  ALDACTONE  Take 0.5 tablets (12.5 mg total) by mouth daily.     thiamine 100 MG tablet  Take 1 tablet (100 mg total) by mouth daily.     traMADol 50 MG tablet  Commonly known as:  ULTRAM  Take 1 tablet (50 mg total) by mouth every 6 (six) hours as needed.       Follow-up Information    Follow up with [facility].   Why:  They will contact you to    Contact information:   [address] [place] [address] [phone]       Follow up with Nolene Ebbs A, MD. Schedule an appointment as soon as possible for a visit in 5 days.   Specialty:  Internal Medicine   Why:  To be seen with repeat labs (CBC & CMP).   Contact information:   [address] [place] [address] [phone]        The results of significant diagnostics from this hospitalization (including imaging, microbiology, ancillary and laboratory) are listed below  for reference.    Significant Diagnostic Studies: Dg Chest 2 View  08/10/2015  CLINICAL DATA:  Fall.  Hypoglycemia. EXAM: CHEST  2 VIEW COMPARISON:  06/15/2014 FINDINGS: Minimal bibasilar opacities, likely atelectasis or scarring. Heart is normal size. No effusions. No acute bony abnormality. Degenerative changes in the shoulders and thoracic spine. Multiple compression fractures in the mid thoracic spine, likely chronic. IMPRESSION: Bibasilar scarring or atelectasis. Electronically Signed   By: Rolm Baptise M.D.   On: 08/10/2015 13:22   Dg Elbow 2 Views Left  08/11/2015  CLINICAL DATA:  Arm pain, diffuse, left. Pt c/o pain at her left shoulder, elbow and wrist over the last few days. Pt states she fell roughly 1 month ago but that she fell on her right side. No known injuries to the left upper extremity. History of gout. EXAM: LEFT ELBOW - 2 VIEW COMPARISON:  None. FINDINGS: No fracture or dislocation. No significant arthropathic change. No joint effusion. No bone lesion. Bones are demineralized. Soft tissues are unremarkable. IMPRESSION: 1. No fracture or dislocation. 2. No significant arthropathic change. Electronically Signed   By: Lajean Manes M.D.   On: 08/11/2015 12:27   Dg Wrist Complete Left  08/11/2015  CLINICAL DATA:  Arm pain, diffuse, left. Pt c/o pain at her left shoulder, elbow and wrist over the last few days. Pt states she fell roughly 1 month ago but that she fell on her right side. No known injuries to the left upper extremity. History of  gout. EXAM: LEFT WRIST - COMPLETE 3+ VIEW COMPARISON:  None. FINDINGS: No fracture.  No dislocation. Bones are diffusely demineralized. Joint spaces are well maintained. There is a small marginal erosion from the distal aspect of the scaphoid. Subtle subchondral erosions are noted along the proximal aspect of the lunate. There is calcification along the triangle fibrocartilage complex. Soft tissues are unremarkable. IMPRESSION: 1. No fracture or  dislocation. 2. Relatively subtle changes of an erosive arthropathy. This could be due to gout or other inflammatory arthropathy. Electronically Signed   By: Lajean Manes M.D.   On: 08/11/2015 12:26   Ct Head Wo Contrast  08/14/2015  CLINICAL DATA:  Unwitnessed fall. History of encephalopathy. Vomiting. EXAM: CT HEAD WITHOUT CONTRAST TECHNIQUE: Contiguous axial images were obtained from the base of the skull through the vertex without intravenous contrast. COMPARISON:  August 10, 2015 FINDINGS: FINDINGS The ventricles are normal in size and configuration. No mass, hemorrhage, extra-axial fluid collection, or midline shift is apparent. There is decreased attenuation throughout portions of the superior left temporal and anterior left occipital lobe compared to the normal-appearing right side, representing a change from the prior study. This appearance is concerning for acute infarct in the posterior temporal and anterior upper occipital lobes on the left. No other evidence of acute infarct. There is mild small vessel disease in the centra semiovale. The appearance of the calvarium suggests osteomalacia, stable. No fracture is evident. Mastoid air cells are clear. There is chronic remodeling of the right maxillary antrum. No intraorbital lesions are identified. IMPRESSION: Evidence of cytotoxic edema in the posterior temporal and anterior occipital lobes on the left. Acute infarct in this region is suspected. No hemorrhage.  No mass or extra-axial fluid collection. 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 Chronic remodeling in the right maxillary sinus region. These results will be called to the ordering clinician or representative by the Radiologist Assistant, and communication documented in the PACS or zVision Dashboard. Electronically Signed   By: Lowella Grip III M.D.   On: 08/14/2015 11:21   Ct Head Wo Contrast  08/10/2015  CLINICAL DATA:  Status post fall. EXAM: CT HEAD WITHOUT CONTRAST CT  CERVICAL SPINE WITHOUT CONTRAST TECHNIQUE: Multidetector CT imaging of the head and cervical spine was performed following the standard protocol without intravenous contrast. Multiplanar CT image reconstructions of the cervical spine were also generated. COMPARISON:  06/14/2014 FINDINGS: CT HEAD FINDINGS There is no evidence of mass effect, midline shift, or extra-axial fluid collections. There is no evidence of a space-occupying lesion or intracranial hemorrhage. There is no evidence of a cortical-based area of acute infarction. There is generalized cerebral atrophy. There is periventricular white matter low attenuation likely secondary to microangiopathy. The ventricles and sulci are appropriate for the patient's age. The basal cisterns are patent. Visualized portions of the orbits are unremarkable. There is chronic right maxillary sinus disease partially visualized. Cerebrovascular atherosclerotic calcifications are noted. The osseous structures are unremarkable. CT CERVICAL SPINE FINDINGS The alignment is anatomic. The vertebral body heights are maintained. There is no acute fracture. 4 mm of anterolisthesis of C3 on C4 secondary to severe left facet disease. The prevertebral soft tissues are normal. The intraspinal soft tissues are not fully imaged on this examination due to poor soft tissue contrast, but there is no gross soft tissue abnormality. Degenerative disc disease at C3-4, C4-5, C5-6 and C6-7. Broad-based disc osteophyte complexes at C4-5, C5-6 and C6-7. Mild broad-based disc bulge at C7-T1. Moderate left facet arthropathy at C3-4 with  mild erosive changes. Mild left facet arthropathy at C4-5. Bilateral uncovertebral degenerative changes at C5-6 with left foraminal narrowing. Right uncovertebral degenerative change at C6-7 with right foraminal narrowing. There is osseous fusion of the right C4-5 posterior elements. The visualized portions of the lung apices demonstrate no focal abnormality. There is  bilateral carotid artery atherosclerosis. IMPRESSION: 1. No acute intracranial pathology. 2. No acute osseous injury of the cervical spine. Electronically Signed   By: Kathreen Devoid   On: 08/10/2015 13:12   Ct Cervical Spine Wo Contrast  08/10/2015  CLINICAL DATA:  Status post fall. EXAM: CT HEAD WITHOUT CONTRAST CT CERVICAL SPINE WITHOUT CONTRAST TECHNIQUE: Multidetector CT imaging of the head and cervical spine was performed following the standard protocol without intravenous contrast. Multiplanar CT image reconstructions of the cervical spine were also generated. COMPARISON:  06/14/2014 FINDINGS: CT HEAD FINDINGS There is no evidence of mass effect, midline shift, or extra-axial fluid collections. There is no evidence of a space-occupying lesion or intracranial hemorrhage. There is no evidence of a cortical-based area of acute infarction. There is generalized cerebral atrophy. There is periventricular white matter low attenuation likely secondary to microangiopathy. The ventricles and sulci are appropriate for the patient's age. The basal cisterns are patent. Visualized portions of the orbits are unremarkable. There is chronic right maxillary sinus disease partially visualized. Cerebrovascular atherosclerotic calcifications are noted. The osseous structures are unremarkable. CT CERVICAL SPINE FINDINGS The alignment is anatomic. The vertebral body heights are maintained. There is no acute fracture. 4 mm of anterolisthesis of C3 on C4 secondary to severe left facet disease. The prevertebral soft tissues are normal. The intraspinal soft tissues are not fully imaged on this examination due to poor soft tissue contrast, but there is no gross soft tissue abnormality. Degenerative disc disease at C3-4, C4-5, C5-6 and C6-7. Broad-based disc osteophyte complexes at C4-5, C5-6 and C6-7. Mild broad-based disc bulge at C7-T1. Moderate left facet arthropathy at C3-4 with mild erosive changes. Mild left facet arthropathy at  C4-5. Bilateral uncovertebral degenerative changes at C5-6 with left foraminal narrowing. Right uncovertebral degenerative change at C6-7 with right foraminal narrowing. There is osseous fusion of the right C4-5 posterior elements. The visualized portions of the lung apices demonstrate no focal abnormality. There is bilateral carotid artery atherosclerosis. IMPRESSION: 1. No acute intracranial pathology. 2. No acute osseous injury of the cervical spine. Electronically Signed   By: Kathreen Devoid   On: 08/10/2015 13:12   Mr Brain Wo Contrast  08/14/2015  CLINICAL DATA:  Code stroke. Decreased responsiveness. Not following commands. EXAM: MRI HEAD WITHOUT CONTRAST TECHNIQUE: Multiplanar, multiecho pulse sequences of the brain and surrounding structures were obtained without intravenous contrast. COMPARISON:  CT head without contrast from the same day. FINDINGS: Diffusion-weighted images demonstrate no acute or subacute infarction. No acute hemorrhage or mass lesion is present. Mild periventricular and subcortical T2 changes are evident bilaterally. Ventricles are proportionate to the degree of atrophy. The internal auditory canals are within normal limits. White matter changes extend into the brainstem. The cerebellum is unremarkable. Flow is present in the major intracranial arteries. Bilateral lens replacements are present. Globes and orbits are otherwise intact. Chronic right maxillary sinus disease is present. The remaining paranasal sinuses and the mastoid air cells are clear. The skullbase is within normal limits. Midline sagittal images are within normal limits apart from degenerative change in the upper cervical spine. IMPRESSION: 1. No acute intracranial abnormality or focal lesion to explain the patient's change in mental status. 2.  The CT finding is likely artifactual was the patient was somewhat tilted in the scanner. 3. Mild atrophy and white matter disease likely reflects the sequela of chronic  microvascular ischemia. 4. Spondylosis in the upper cervical spine at C3-4. 5. Chronic right maxillary sinus opacification. Electronically Signed   By: San Morelle M.D.   On: 08/14/2015 18:29   Dg Chest Port 1 View  08/15/2015  CLINICAL DATA:  Patient with history of aspiration pneumonia. EXAM: PORTABLE CHEST 1 VIEW COMPARISON:  Chest radiograph 08/14/2015. FINDINGS: The inferior aspect of the chest is not included on current evaluation. Monitoring leads overlie the patient. Stable cardiac and mediastinal contours. Persistent small layering bilateral pleural effusions with underlying pulmonary consolidation. IMPRESSION: Layering bilateral pleural effusions and underlying opacities may represent atelectasis, aspiration and infection. Electronically Signed   By: Lovey Newcomer M.D.   On: 08/15/2015 07:58   Dg Chest Port 1 View  08/14/2015  CLINICAL DATA:  Vomiting EXAM: PORTABLE CHEST 1 VIEW COMPARISON:  08/10/2015 FINDINGS: Progression of bibasilar airspace disease and bilateral effusions hearing Pulmonary vascularity is unchanged and felt to be within normal limits. No definite heart failure. Chronic fracture deformity of the proximal right humerus unchanged IMPRESSION: Progression of bibasilar airspace disease and small effusions. Possible aspiration pneumonia. Fluid overload is a less likely consideration. Electronically Signed   By: Franchot Gallo M.D.   On: 08/14/2015 10:15   Dg Shoulder Left  08/11/2015  CLINICAL DATA:  Arm pain, diffuse, left. Pt c/o pain at her left shoulder, elbow and wrist over the last few days. Pt states she fell roughly 1 month ago but that she fell on her right side. No known injuries to the left upper extremity. History of gout. EXAM: LEFT SHOULDER - 2+ VIEW COMPARISON:  None. FINDINGS: No fracture or dislocation. There is subchondral sclerosis and cystic change along the articular surface of the humeral head and adjacent glenoid. Small marginal osteophytes project  from the inferior aspect of the glenoid and humeral head. The [facility] joint is normally spaced and aligned with no significant arthropathic change. Bones are demineralized. There is narrowing of the subacromial space to approximately 4.5 mm. This suggests a chronic full-thickness rotator cuff tear. Soft tissues are unremarkable. IMPRESSION: 1. No fracture or dislocation. 2. Arthropathic changes of the glenohumeral joint. These are nonspecific. It could be due to gout or other inflammatory arthritis. 3. Mild narrowing of the subacromial space. Consider a chronic full-thickness rotator cuff tear in the proper clinical setting, which would be best evaluated with MRI. Electronically Signed   By: Lajean Manes M.D.   On: 08/11/2015 12:24   Dg Abd Portable 1v  08/14/2015  CLINICAL DATA:  Vomiting EXAM: PORTABLE ABDOMEN - 1 VIEW COMPARISON:  05/11/2015 FINDINGS: Normal bowel gas pattern. Negative for bowel obstruction or ileus. Surgical bowel clips in the region of the cecum. Several small left renal calculi.  Possible right renal calculus. Bilateral iliac artery stents.  No acute skeletal abnormality. IMPRESSION: Normal bowel gas pattern. Electronically Signed   By: Franchot Gallo M.D.   On: 08/14/2015 10:39    Microbiology: Recent Results (from the past 240 hour(s))  Culture, blood (x 2)     Status: None   Collection Time: 08/10/15  7:30 PM  Result Value Ref Range Status   Specimen Description BLOOD RIGHT ANTECUBITAL  Final   Special Requests IN PEDIATRIC BOTTLE [facility]  Final   Culture NO GROWTH 5 DAYS  Final   Report Status 08/15/2015 FINAL  Final  Culture, blood (x 2)     Status: None   Collection Time: 08/10/15  7:40 PM  Result Value Ref Range Status   Specimen Description BLOOD RIGHT HAND  Final   Special Requests IN PEDIATRIC BOTTLE [facility]  Final   Culture NO GROWTH 5 DAYS  Final   Report Status 08/15/2015 FINAL  Final     Labs: Basic Metabolic Panel:  Recent Labs Lab 08/12/15 1316 08/13/15 1920  08/15/15 0409 08/16/15 0653 08/17/15 0524 08/18/15 0950  NA 138 142 138 141 139 138  K 4.6 3.9 4.0 3.8 3.1* 3.8  CL 112* 109 112* 109 102 101  CO2 19* 21* 17* '25 26 27  ' GLUCOSE 244* 104* 70 107* 148* 281*  BUN '9 9 13 9 7 8  ' CREATININE 1.20* 1.20* 1.35* 1.26* 1.04* 1.25*  CALCIUM 7.8* 9.0 8.1* 8.3* 8.2* 8.4*  MG 1.1* 1.3*  --  1.5* 1.6*  --    Liver Function Tests: No results for input(s): AST, ALT, ALKPHOS, BILITOT, PROT, ALBUMIN in the last 168 hours. No results for input(s): LIPASE, AMYLASE in the last 168 hours. No results for input(s): AMMONIA in the last 168 hours. CBC:  Recent Labs Lab 08/12/15 1316 08/15/15 0409 08/16/15 0653  WBC 12.0* 11.8* 10.8*  HGB 9.6* 9.3* 9.6*  HCT 29.2* 29.0* 28.4*  MCV 85.6 86.6 83.3  PLT 246 205 292   Cardiac Enzymes: No results for input(s): CKTOTAL, CKMB, CKMBINDEX, TROPONINI in the last 168 hours. BNP: BNP (last 3 results) No results for input(s): BNP in the last 8760 hours.  ProBNP (last 3 results) No results for input(s): PROBNP in the last 8760 hours.  CBG:  Recent Labs Lab 08/17/15 1600 08/17/15 2047 08/17/15 2308 08/18/15 0626 08/18/15 1338  GLUCAP 177* 114* 98 119* 253*     Discussed with friend/HCPOA Regina. Rudell Cobb, updated care and answered questions.   Signed:  Vernell Leep, MD, FACP, FHM. [facility] Pager [phone]  If 7PM-7AM, please contact night-coverage [email or web address] Password [email or web address] 08/18/2015, 3:00 PM

## 2015-08-20 DIAGNOSIS — J449 Chronic obstructive pulmonary disease, unspecified: Secondary | ICD-10-CM | POA: Diagnosis not present

## 2015-08-20 DIAGNOSIS — N183 Chronic kidney disease, stage 3 (moderate): Secondary | ICD-10-CM | POA: Diagnosis not present

## 2015-08-20 DIAGNOSIS — Z8701 Personal history of pneumonia (recurrent): Secondary | ICD-10-CM | POA: Diagnosis not present

## 2015-08-20 DIAGNOSIS — I130 Hypertensive heart and chronic kidney disease with heart failure and stage 1 through stage 4 chronic kidney disease, or unspecified chronic kidney disease: Secondary | ICD-10-CM | POA: Diagnosis not present

## 2015-08-20 DIAGNOSIS — E1122 Type 2 diabetes mellitus with diabetic chronic kidney disease: Secondary | ICD-10-CM | POA: Diagnosis not present

## 2015-08-20 DIAGNOSIS — J45909 Unspecified asthma, uncomplicated: Secondary | ICD-10-CM | POA: Diagnosis not present

## 2015-08-20 DIAGNOSIS — H409 Unspecified glaucoma: Secondary | ICD-10-CM | POA: Diagnosis not present

## 2015-08-20 DIAGNOSIS — D649 Anemia, unspecified: Secondary | ICD-10-CM | POA: Diagnosis not present

## 2015-08-20 DIAGNOSIS — I5032 Chronic diastolic (congestive) heart failure: Secondary | ICD-10-CM | POA: Diagnosis not present

## 2015-08-20 DIAGNOSIS — E860 Dehydration: Secondary | ICD-10-CM | POA: Diagnosis not present

## 2015-08-20 DIAGNOSIS — E1151 Type 2 diabetes mellitus with diabetic peripheral angiopathy without gangrene: Secondary | ICD-10-CM | POA: Diagnosis not present

## 2015-08-20 DIAGNOSIS — Z87440 Personal history of urinary (tract) infections: Secondary | ICD-10-CM | POA: Diagnosis not present

## 2015-08-20 DIAGNOSIS — E876 Hypokalemia: Secondary | ICD-10-CM | POA: Diagnosis not present

## 2015-08-24 ENCOUNTER — Encounter: Payer: Self-pay | Admitting: Adult Health

## 2015-08-24 NOTE — Progress Notes (Signed)
Patient ID: [name], female   DOB: May 19, 1946, 69 y.o.   MRN: [id number]    Facility[facility]      Allergies  Allergen Reactions  . Penicillins Hives    Has patient had a PCN reaction causing immediate rash, facial/tongue/throat swelling, SOB or lightheadedness with hypotension: No Has patient had a PCN reaction causing severe rash involving mucus membranes or skin necrosis: No Has patient had a PCN reaction that required hospitalization No Has patient had a PCN reaction occurring within the last 10 years: No If all of the above answers are "NO", then may proceed with Cephalosporin use.   . Lipitor [Atorvastatin] Swelling  . Compazine [Prochlorperazine Edisylate] Other (See Comments)    Altered mental status  . Phenergan [Promethazine Hcl] Other (See Comments)    Altered mental status    Chief Complaint  Patient presents with  . Medical Management of Chronic Issues    HPI:  She is a patient of this facility being seen for the management of her chronic illnesses. Her cbg's are elevated. She is not voicing any complaints or concerns at this time. She tells me that she is feeling good. There are no nursing concerns at this time.    Past Medical History  Diagnosis Date  . Alcoholic cirrhosis ([place])   . Reflux esophagitis   . Gastroesophageal reflux   . Colon polyps   . CHF (congestive heart failure) ([place]) 10/13    grade 1 diastolic dysfunction, Nl LVF  . Hypertension   . History of GI diverticular bleed   . Pneumonia     history of  . Asthma   . Anemia   . Blood transfusion few years ago  . Villous adenoma of colon 05/10/11  . PVD (peripheral vascular disease) ([place]) 2009    bilat iliac stenting  . Chronic obstructive pulmonary disease (COPD) ([place])   . Arthritis   . Glaucoma     left  . Gout   . Acute venous embolism and thrombosis of deep vessels of proximal lower extremity ([place])   . Back pain   . Leg swelling   . Diabetes mellitus     insulin dep     . Hepatitis     alcoholic hepatitis  . Depression   . Anxiety   . On home oxygen therapy oxygen 2 liter per minute prn per [place]  . Cellulitis     bilateral lower extremities  . Chronic diastolic congestive heart failure [facility])     Past Surgical History  Procedure Laterality Date  . Cataract extraction Right yrs ago  . Colonoscopy    . Laparoscopic assissted total colectomy w/ j-pouch  04/22/11  . Ventral hernia repair  06/13/2012    Procedure: LAPAROSCOPIC VENTRAL HERNIA;  Surgeon: Adin Hector, MD;  Location: [place];  Service: General;  Laterality: N/A;  Laparoscopic Assisted Ventral Hernia with Mesh  . Abdominal hysterectomy  yrs ago  . Esophagogastroduodenoscopy (egd) with propofol N/A 08/06/2013    Procedure: ESOPHAGOGASTRODUODENOSCOPY (EGD) WITH PROPOFOL;  Surgeon: Lear Ng, MD;  [facility];  Service: Endoscopy;  Laterality: N/A;  . Colonoscopy with propofol N/A 08/06/2013    Procedure: COLONOSCOPY WITH PROPOFOL;  Surgeon: Lear Ng, MD;  Location[facility];  Service: Endoscopy;  Laterality: N/A;    VITAL SIGNS BP 133/70 mmHg  Pulse 76  Ht '5\' 3"'  (1.6 m)  Wt 121 lb 9.6 oz (55.157 kg)  BMI 21.55 kg/m2  SpO2 95%  Patient's  Medications  ALBUTEROL (PROVENTIL HFA;VENTOLIN HFA) 108 (90 BASE) MCG/ACT INHALER    Inhale 2 puffs into the lungs every 6 (six) hours as needed. For shortness of breath  ALBUTEROL (PROVENTIL) (2.5 MG/3ML) 0.083% NEBULIZER SOLUTION    Take 2.5 mg by nebulization every 6 (six) hours as needed for wheezing or shortness of breath.  ALLOPURINOL (ZYLOPRIM) 100 MG TABLET    Take 100 mg by mouth 2 (two) times daily.  ASPIRIN 81 MG EC TABLET    Take 81 mg by mouth daily.    CARVEDILOL (COREG) 12.5 MG TABLET    Take 12.5 mg by mouth 2 (two) times daily with a meal.  CILOSTAZOL (PLETAL) 100 MG TABLET    Take 50 mg by mouth 2 (two) times daily before a meal.   CLONIDINE (CATAPRES) 0.1 MG TABLET    Take 1 tablet (0.1 mg total) by  mouth 3 (three) times daily.  ERGOCALCIFEROL (VITAMIN D2) 50000 UNITS CAPSULE    Take 50,000 Units by mouth every Monday. On Monday  FERROUS SULFATE 325 (65 FE) MG TABLET    Take 325 mg by mouth daily with breakfast.  FLUTICASONE (FLONASE) 50 MCG/ACT NASAL SPRAY    Place 2 sprays into the nose daily as needed for allergies.   GLUCOSE MONITORING KIT (FREESTYLE) MONITORING KIT    1 each by Does not apply route 4 (four) times daily - after meals and at bedtime. 1 month Diabetic Testing Supplies for QAC-QHS accuchecks.Any brand OK  INSULIN GLARGINE (LANTUS) 100 UNIT/ML INJECTION    Inject 5 Units into the skin at bedtime.  MAGNESIUM OXIDE (MAG-OX) 400 MG TABLET    Take 400 mg by mouth daily.  MONTELUKAST (SINGULAIR) 10 MG TABLET    Take 10 mg by mouth at bedtime.   OMEPRAZOLE (PRILOSEC) 40 MG CAPSULE    Take 40 mg by mouth daily.   OXYGEN-HELIUM IN    Inhale 2 L into the lungs at bedtime. 2 liters nightly  SIMVASTATIN (ZOCOR) 10 MG TABLET    Take 10 mg by mouth daily.   TRAMADOL (ULTRAM) 50 MG TABLET    Take 1 tablet (50 mg total) by mouth every 6 (six) hours as needed.    New Prescriptions   No medications on file  Previous Medications  Modified Medications   No medications on file  Discontinued Medications   No medications on file     SIGNIFICANT DIAGNOSTIC EXAMS   02/2013: ABI: 0.68 and 0.69   05-09-15: ct of abdomen and pelvis: Calcifications in the pancreas consistent with chronic pancreatitis. Prominence the gastric wall may be due to thickening or normal under distention. Gastritis is not excluded. No evidence of bowel obstruction. Diverticulosis of sigmoid colon without evidence of diverticulitis. Inferior ventral abdominal wall hernia containing fat.  06-02-15: right shoulder x-ray: Impacted right  humeral neck fracture.  Osteopenia.  06-16-15: right upper extremity doppler: no dvt    LABS REVIEWED:   05-10-15: hgb a1c 7.2 06-02-15; wbc 10.4; hgb 8.3; hct 25.8; mcv 85.1; plt  287; glucose 153; bun 20; creat 1.26; k+ 3.6; na++137; liver normal albumin 2.1  06-03-15: wbc 16.8; hgb 9.5; hct 29.3; mcv 84.4; plt 365; glucose 198; bun 20; creat 1.04; k+ 3.6; na++137; liver normal albumin 2.4  06-10-15; ferritin 416; iron 31; tibc 100 06-12-15: wbc 10.5; hgb 9.6; hct 29.0; mcv 87.3; plt 324     Review of Systems  Unable to perform ROS: Dementia      Physical Exam  Constitutional:  No distress.  Frail   Eyes: Conjunctivae are normal.  Neck: Neck supple. No JVD present. No thyromegaly present.  Cardiovascular: Normal rate and regular rhythm.   Bilateral lower extremities discolored due to pvd Pedal pulses very faint   Respiratory: Effort normal and breath sounds normal. No respiratory distress. She has no wheezes.  GI: Soft. Bowel sounds are normal. She exhibits no distension. There is no tenderness.  Musculoskeletal: She exhibits no edema.  Right arm in sling Able to move all other extremities   Lymphadenopathy:    She has no cervical adenopathy.  Neurological: She is alert.  Skin: Skin is warm and dry. She is not diaphoretic.  Psychiatric: She has a normal mood and affect.       ASSESSMENT/ PLAN:  1. Right humeral head fracture: will continue therapy as directed and will follow up with orthopedics as indicated.  Will conitnue ultram 50 mg every 6 hours as needed and will monitor her status.   2. COPD: is presently stable at this time. She is 02 dependent; will continue singulair 10 mg nightly; will continue albuterol inhaler or neb treatment every 6 hours as needed and flonase daily as needed for allergic rhinitis  3. Gout: no recent flares present will continue allopurinol 100 mg twice daily   4. Dyslipidemia: will continue zocor 10 mg daily   5. PVD: will continue pletal 100 mg twice daily asa 81 mg daily   6. Hypertension: will  clonidine 0.1 mg three times daily asa 81 mg daily will stop the lopressor as she is on coreg and will increase to 12.5  mg twice daily will have nursing check blood pressure twice daily with pulse and will monitor   7. Chronic diastolic heart failure: will continue coreg at 12.5 mg twice daily is presently not on diuretic will continue asa 81 mg daily and will monitor weight 3 times weekly    8. PUD status post GI bleed (2011): will continue prilosec 40 mg daily   9. Vit d deficiency: will continue vit d 50,000 units weekly  10. Cirrhosis of liver: her liver function is stable at this time will monitor   11. Chronic kidney disease stage III: renal function is stable with bun/creat 20/1.04  12. Diabetes: will continue lantus 5 units daily and will begin humalog 5 units at lunch and supper           Ok Edwards NP [facility]  Contact [phone] Monday through Friday 8am- 5pm  After hours call [phone]

## 2015-08-25 DIAGNOSIS — N183 Chronic kidney disease, stage 3 (moderate): Secondary | ICD-10-CM | POA: Diagnosis not present

## 2015-08-25 DIAGNOSIS — E1122 Type 2 diabetes mellitus with diabetic chronic kidney disease: Secondary | ICD-10-CM | POA: Diagnosis not present

## 2015-08-25 DIAGNOSIS — D649 Anemia, unspecified: Secondary | ICD-10-CM | POA: Diagnosis not present

## 2015-08-25 DIAGNOSIS — Z8701 Personal history of pneumonia (recurrent): Secondary | ICD-10-CM | POA: Diagnosis not present

## 2015-08-25 DIAGNOSIS — Z87440 Personal history of urinary (tract) infections: Secondary | ICD-10-CM | POA: Diagnosis not present

## 2015-08-25 DIAGNOSIS — E876 Hypokalemia: Secondary | ICD-10-CM | POA: Diagnosis not present

## 2015-08-25 DIAGNOSIS — E1151 Type 2 diabetes mellitus with diabetic peripheral angiopathy without gangrene: Secondary | ICD-10-CM | POA: Diagnosis not present

## 2015-08-25 DIAGNOSIS — J449 Chronic obstructive pulmonary disease, unspecified: Secondary | ICD-10-CM | POA: Diagnosis not present

## 2015-08-25 DIAGNOSIS — J45909 Unspecified asthma, uncomplicated: Secondary | ICD-10-CM | POA: Diagnosis not present

## 2015-08-25 DIAGNOSIS — I130 Hypertensive heart and chronic kidney disease with heart failure and stage 1 through stage 4 chronic kidney disease, or unspecified chronic kidney disease: Secondary | ICD-10-CM | POA: Diagnosis not present

## 2015-08-25 DIAGNOSIS — H409 Unspecified glaucoma: Secondary | ICD-10-CM | POA: Diagnosis not present

## 2015-08-25 DIAGNOSIS — I5032 Chronic diastolic (congestive) heart failure: Secondary | ICD-10-CM | POA: Diagnosis not present

## 2015-08-25 DIAGNOSIS — E860 Dehydration: Secondary | ICD-10-CM | POA: Diagnosis not present

## 2015-08-26 DIAGNOSIS — H409 Unspecified glaucoma: Secondary | ICD-10-CM | POA: Diagnosis not present

## 2015-08-26 DIAGNOSIS — J45909 Unspecified asthma, uncomplicated: Secondary | ICD-10-CM | POA: Diagnosis not present

## 2015-08-26 DIAGNOSIS — N183 Chronic kidney disease, stage 3 (moderate): Secondary | ICD-10-CM | POA: Diagnosis not present

## 2015-08-26 DIAGNOSIS — Z87440 Personal history of urinary (tract) infections: Secondary | ICD-10-CM | POA: Diagnosis not present

## 2015-08-26 DIAGNOSIS — E1151 Type 2 diabetes mellitus with diabetic peripheral angiopathy without gangrene: Secondary | ICD-10-CM | POA: Diagnosis not present

## 2015-08-26 DIAGNOSIS — D649 Anemia, unspecified: Secondary | ICD-10-CM | POA: Diagnosis not present

## 2015-08-26 DIAGNOSIS — E876 Hypokalemia: Secondary | ICD-10-CM | POA: Diagnosis not present

## 2015-08-26 DIAGNOSIS — J449 Chronic obstructive pulmonary disease, unspecified: Secondary | ICD-10-CM | POA: Diagnosis not present

## 2015-08-26 DIAGNOSIS — I130 Hypertensive heart and chronic kidney disease with heart failure and stage 1 through stage 4 chronic kidney disease, or unspecified chronic kidney disease: Secondary | ICD-10-CM | POA: Diagnosis not present

## 2015-08-26 DIAGNOSIS — Z8701 Personal history of pneumonia (recurrent): Secondary | ICD-10-CM | POA: Diagnosis not present

## 2015-08-26 DIAGNOSIS — I5032 Chronic diastolic (congestive) heart failure: Secondary | ICD-10-CM | POA: Diagnosis not present

## 2015-08-26 DIAGNOSIS — E1122 Type 2 diabetes mellitus with diabetic chronic kidney disease: Secondary | ICD-10-CM | POA: Diagnosis not present

## 2015-08-26 DIAGNOSIS — E860 Dehydration: Secondary | ICD-10-CM | POA: Diagnosis not present

## 2015-08-28 ENCOUNTER — Encounter: Payer: Self-pay | Admitting: Adult Health

## 2015-08-28 DIAGNOSIS — J45909 Unspecified asthma, uncomplicated: Secondary | ICD-10-CM | POA: Diagnosis not present

## 2015-08-28 DIAGNOSIS — Z87440 Personal history of urinary (tract) infections: Secondary | ICD-10-CM | POA: Diagnosis not present

## 2015-08-28 DIAGNOSIS — N183 Chronic kidney disease, stage 3 (moderate): Secondary | ICD-10-CM | POA: Diagnosis not present

## 2015-08-28 DIAGNOSIS — H409 Unspecified glaucoma: Secondary | ICD-10-CM | POA: Diagnosis not present

## 2015-08-28 DIAGNOSIS — I130 Hypertensive heart and chronic kidney disease with heart failure and stage 1 through stage 4 chronic kidney disease, or unspecified chronic kidney disease: Secondary | ICD-10-CM | POA: Diagnosis not present

## 2015-08-28 DIAGNOSIS — E1151 Type 2 diabetes mellitus with diabetic peripheral angiopathy without gangrene: Secondary | ICD-10-CM | POA: Diagnosis not present

## 2015-08-28 DIAGNOSIS — E876 Hypokalemia: Secondary | ICD-10-CM | POA: Diagnosis not present

## 2015-08-28 DIAGNOSIS — D649 Anemia, unspecified: Secondary | ICD-10-CM | POA: Diagnosis not present

## 2015-08-28 DIAGNOSIS — Z8701 Personal history of pneumonia (recurrent): Secondary | ICD-10-CM | POA: Diagnosis not present

## 2015-08-28 DIAGNOSIS — E1122 Type 2 diabetes mellitus with diabetic chronic kidney disease: Secondary | ICD-10-CM | POA: Diagnosis not present

## 2015-08-28 DIAGNOSIS — I5032 Chronic diastolic (congestive) heart failure: Secondary | ICD-10-CM | POA: Diagnosis not present

## 2015-08-28 DIAGNOSIS — J449 Chronic obstructive pulmonary disease, unspecified: Secondary | ICD-10-CM | POA: Diagnosis not present

## 2015-08-28 DIAGNOSIS — E860 Dehydration: Secondary | ICD-10-CM | POA: Diagnosis not present

## 2015-08-28 NOTE — Progress Notes (Signed)
Patient ID: [name], female   DOB: 04-25-1946, 69 y.o.   MRN: [id number]    [facility]      Allergies  Allergen Reactions  . Penicillins Hives    Has patient had a PCN reaction causing immediate rash, facial/tongue/throat swelling, SOB or lightheadedness with hypotension: No Has patient had a PCN reaction causing severe rash involving mucus membranes or skin necrosis: No Has patient had a PCN reaction that required hospitalization No Has patient had a PCN reaction occurring within the last 10 years: No If all of the above answers are "NO", then may proceed with Cephalosporin use.   . Lipitor [Atorvastatin] Swelling  . Compazine [Prochlorperazine Edisylate] Other (See Comments)    Altered mental status  . Phenergan [Promethazine Hcl] Other (See Comments)    Altered mental status    Chief Complaint  Patient presents with  . Discharge Note    HPI:  She is being discharged to home with home health for OT. She will not need dme. She will need her prescriptions to be written and will need a follow up with her pcp. She had been hospitalized for a right humeral head fracture. She was admitted to this facility for short term rehab.   Past Medical History  Diagnosis Date  . Alcoholic cirrhosis ([place])   . Reflux esophagitis   . Gastroesophageal reflux   . Colon polyps   . CHF (congestive heart failure) ([place]) 10/13    grade 1 diastolic dysfunction, Nl LVF  . Hypertension   . History of GI diverticular bleed   . Pneumonia     history of  . Asthma   . Anemia   . Blood transfusion few years ago  . Villous adenoma of colon 05/10/11  . PVD (peripheral vascular disease) ([place]) 2009    bilat iliac stenting  . Chronic obstructive pulmonary disease (COPD) ([place])   . Arthritis   . Glaucoma     left  . Gout   . Acute venous embolism and thrombosis of deep vessels of proximal lower extremity ([place])   . Back pain   . Leg swelling   . Diabetes mellitus     insulin dep    . Hepatitis     alcoholic hepatitis  . Depression   . Anxiety   . On home oxygen therapy oxygen 2 liter per minute prn per [place]  . Cellulitis     bilateral lower extremities  . Chronic diastolic congestive heart failure [facility])     Past Surgical History  Procedure Laterality Date  . Cataract extraction Right yrs ago  . Colonoscopy    . Laparoscopic assissted total colectomy w/ j-pouch  04/22/11  . Ventral hernia repair  06/13/2012    Procedure: LAPAROSCOPIC VENTRAL HERNIA;  Surgeon: Adin Hector, MD;  Location: [place];  Service: General;  Laterality: N/A;  Laparoscopic Assisted Ventral Hernia with Mesh  . Abdominal hysterectomy  yrs ago  . Esophagogastroduodenoscopy (egd) with propofol N/A 08/06/2013    Procedure: ESOPHAGOGASTRODUODENOSCOPY (EGD) WITH PROPOFOL;  Surgeon: Lear Ng, MD;  [facility];  Service: Endoscopy;  Laterality: N/A;  . Colonoscopy with propofol N/A 08/06/2013    Procedure: COLONOSCOPY WITH PROPOFOL;  Surgeon: Lear Ng, MD;  [facility];  Service: Endoscopy;  Laterality: N/A;    VITAL SIGNS BP 148/78 mmHg  Pulse 70  Ht '5\' 3"'$  (1.6 m)  Wt 120 lb (54.432 kg)  BMI 21.26 kg/m2  SpO2 94%  Patient's Medications  ALBUTEROL (PROVENTIL HFA;VENTOLIN HFA) 108 (90 BASE) MCG/ACT INHALER    Inhale 2 puffs into the lungs every 6 (six) hours as needed. For shortness of breath  ALBUTEROL (PROVENTIL) (2.5 MG/3ML) 0.083% NEBULIZER SOLUTION    Take 2.5 mg by nebulization every 6 (six) hours as needed for wheezing or shortness of breath.  ALLOPURINOL (ZYLOPRIM) 100 MG TABLET    Take 100 mg by mouth 2 (two) times daily.  ASPIRIN 81 MG EC TABLET    Take 81 mg by mouth daily.    CARVEDILOL (COREG) 12.5 MG TABLET    Take 12.5 mg by mouth 2 (two) times daily with a meal.  CILOSTAZOL (PLETAL) 100 MG TABLET    Take 50 mg by mouth 2 (two) times daily before a meal.   CLONIDINE (CATAPRES) 0.1 MG TABLET    Take 1 tablet (0.1 mg total) by mouth 3  (three) times daily.  ERGOCALCIFEROL (VITAMIN D2) 50000 UNITS CAPSULE    Take 50,000 Units by mouth every Monday. On Monday  FERROUS SULFATE 325 (65 FE) MG TABLET    Take 325 mg by mouth daily with breakfast.  FLUTICASONE (FLONASE) 50 MCG/ACT NASAL SPRAY    Place 2 sprays into the nose daily as needed for allergies.   INSULIN GLARGINE (LANTUS) 100 UNIT/ML INJECTION    Inject 5 Units into the skin at bedtime.  MAGNESIUM OXIDE (MAG-OX) 400 MG TABLET    Take 400 mg by mouth daily.  MONTELUKAST (SINGULAIR) 10 MG TABLET    Take 10 mg by mouth at bedtime.   OMEPRAZOLE (PRILOSEC) 40 MG CAPSULE    Take 40 mg by mouth daily.   OXYGEN-HELIUM IN    Inhale 2 L into the lungs at bedtime. 2 liters nightly  SIMVASTATIN (ZOCOR) 10 MG TABLET    Take 10 mg by mouth daily.   TRAMADOL (ULTRAM) 50 MG TABLET    Take 1 tablet (50 mg total) by mouth every 6 (six) hours as needed.    New Prescriptions   No medications on file  Previous Medications  Modified Medications   No medications on file  Discontinued Medications   No medications on file     SIGNIFICANT DIAGNOSTIC EXAMS    02/2013: ABI: 0.68 and 0.69   05-09-15: ct of abdomen and pelvis: Calcifications in the pancreas consistent with chronic pancreatitis. Prominence the gastric wall may be due to thickening or normal under distention. Gastritis is not excluded. No evidence of bowel obstruction. Diverticulosis of sigmoid colon without evidence of diverticulitis. Inferior ventral abdominal wall hernia containing fat.  06-02-15: right shoulder x-ray: Impacted right  humeral neck fracture.  Osteopenia.  06-16-15: right upper extremity doppler: no dvt    LABS REVIEWED:   05-10-15: hgb a1c 7.2 06-02-15; wbc 10.4; hgb 8.3; hct 25.8; mcv 85.1; plt 287; glucose 153; bun 20; creat 1.26; k+ 3.6; na++137; liver normal albumin 2.1  06-03-15: wbc 16.8; hgb 9.5; hct 29.3; mcv 84.4; plt 365; glucose 198; bun 20; creat 1.04; k+ 3.6; na++137; liver normal albumin 2.4   06-10-15; ferritin 416; iron 31; tibc 100 06-12-15: wbc 10.5; hgb 9.6; hct 29.0; mcv 87.3; plt 324    Review of Systems  Constitutional: Negative for malaise/fatigue.  Respiratory: Negative for cough and shortness of breath.   Cardiovascular: Negative for chest pain, palpitations and leg swelling.  Gastrointestinal: Negative for heartburn, abdominal pain and constipation.  Musculoskeletal: Negative for myalgias and joint pain.  Skin: Negative.   Psychiatric/Behavioral: The patient is not nervous/anxious.  Physical Exam  Constitutional: No distress.  Frail   Eyes: Conjunctivae are normal.  Neck: Neck supple. No JVD present. No thyromegaly present.  Cardiovascular: Normal rate and regular rhythm.   Bilateral lower extremities discolored due to pvd Pedal pulses very faint   Respiratory: Effort normal and breath sounds normal. No respiratory distress. She has no wheezes.  GI: Soft. Bowel sounds are normal. She exhibits no distension. There is no tenderness.  Musculoskeletal: She exhibits no edema.  Right arm no longer in sling Able to move all  extremities   Lymphadenopathy:    She has no cervical adenopathy.  Neurological: She is alert.  Skin: Skin is warm and dry. She is not diaphoretic.  Psychiatric: She has a normal mood and affect.      ASSESSMENT/ PLAN:  Will discharge to home with home health for ot to evaluate and treat as indicated for adl training. She will not need dme. Her prescriptions have been written for a 30 day supply of her medications with #30 ultram 50 mg tabs. The facility is aware to setup a follow up appointment with her pcp within the the next one to two weeks.    Time spent with patient  40  minutes >50% time spent counseling; reviewing medical record; tests; labs; and developing future plan of care   Ok Edwards NP [facility]  Contact [phone] Monday through Friday 8am- 5pm  After hours call [phone]

## 2015-10-13 ENCOUNTER — Other Ambulatory Visit: Payer: Self-pay | Admitting: Cardiovascular Disease

## 2015-10-13 DIAGNOSIS — I708 Atherosclerosis of other arteries: Principal | ICD-10-CM

## 2015-10-13 DIAGNOSIS — I739 Peripheral vascular disease, unspecified: Secondary | ICD-10-CM

## 2015-10-13 DIAGNOSIS — I700 Atherosclerosis of aorta: Secondary | ICD-10-CM

## 2015-10-23 ENCOUNTER — Ambulatory Visit (HOSPITAL_COMMUNITY)
Admission: RE | Admit: 2015-10-23 | Discharge: 2015-10-23 | Disposition: A | Discharge status: Home / Self Care | Payer: Medicare Other | Source: Ambulatory Visit | Attending: Cardiology | Admitting: Cardiology

## 2015-10-23 ENCOUNTER — Ambulatory Visit (HOSPITAL_COMMUNITY)
Admission: RE | Admit: 2015-10-23 | Discharge: 2015-10-23 | Disposition: A | Discharge status: Home / Self Care | Payer: Medicare Other | Source: Ambulatory Visit | Attending: Cardiovascular Disease | Admitting: Cardiovascular Disease

## 2015-10-23 DIAGNOSIS — E119 Type 2 diabetes mellitus without complications: Secondary | ICD-10-CM | POA: Insufficient documentation

## 2015-10-23 DIAGNOSIS — I110 Hypertensive heart disease with heart failure: Secondary | ICD-10-CM | POA: Diagnosis not present

## 2015-10-23 DIAGNOSIS — R938 Abnormal findings on diagnostic imaging of other specified body structures: Secondary | ICD-10-CM | POA: Insufficient documentation

## 2015-10-23 DIAGNOSIS — I700 Atherosclerosis of aorta: Secondary | ICD-10-CM | POA: Insufficient documentation

## 2015-10-23 DIAGNOSIS — I739 Peripheral vascular disease, unspecified: Secondary | ICD-10-CM | POA: Insufficient documentation

## 2015-10-23 DIAGNOSIS — I708 Atherosclerosis of other arteries: Secondary | ICD-10-CM | POA: Insufficient documentation

## 2015-10-23 DIAGNOSIS — I5032 Chronic diastolic (congestive) heart failure: Secondary | ICD-10-CM | POA: Insufficient documentation

## 2015-11-04 ENCOUNTER — Other Ambulatory Visit: Payer: Self-pay

## 2015-11-04 DIAGNOSIS — Z1231 Encounter for screening mammogram for malignant neoplasm of breast: Secondary | ICD-10-CM

## 2015-12-09 ENCOUNTER — Ambulatory Visit
Admission: RE | Admit: 2015-12-09 | Discharge: 2015-12-09 | Disposition: A | Discharge status: Home / Self Care | Payer: Medicare Other | Source: Ambulatory Visit

## 2015-12-09 ENCOUNTER — Ambulatory Visit: Payer: Self-pay

## 2015-12-09 DIAGNOSIS — Z1231 Encounter for screening mammogram for malignant neoplasm of breast: Secondary | ICD-10-CM

## 2015-12-30 ENCOUNTER — Ambulatory Visit: Payer: Medicare Other | Admitting: Podiatry

## 2016-01-05 ENCOUNTER — Encounter: Payer: Self-pay | Admitting: Podiatry

## 2016-01-05 ENCOUNTER — Ambulatory Visit (INDEPENDENT_AMBULATORY_CARE_PROVIDER_SITE_OTHER): Payer: Medicare Other | Admitting: Podiatry

## 2016-01-05 VITALS — BP 101/70 | HR 86 | Resp 12

## 2016-01-05 DIAGNOSIS — Q828 Other specified congenital malformations of skin: Secondary | ICD-10-CM | POA: Diagnosis not present

## 2016-01-05 DIAGNOSIS — I739 Peripheral vascular disease, unspecified: Secondary | ICD-10-CM | POA: Diagnosis not present

## 2016-01-05 DIAGNOSIS — M79675 Pain in left toe(s): Secondary | ICD-10-CM

## 2016-01-05 DIAGNOSIS — B351 Tinea unguium: Secondary | ICD-10-CM | POA: Diagnosis not present

## 2016-01-05 DIAGNOSIS — M79674 Pain in right toe(s): Secondary | ICD-10-CM | POA: Diagnosis not present

## 2016-01-05 NOTE — Progress Notes (Signed)
   Subjective:    Patient ID: [name], female    DOB: Jul 16, 1946, 70 y.o.   MRN: [id number]  HPI    This patient presents today complaining of elongated and thickened toenails which are cough walking wearing shoes and requests toenail debridement. The nails have gradually thickened over a long period of time and patient was not able to trim the toenails and pedicurist refused to trim toenails because patient is known diabetic  Patient is a diabetic denies any history of foot ulceration, amputation and she denies claudication  Review of Systems  Musculoskeletal: Positive for gait problem.  Skin: Positive for color change.       Objective:   Physical Exam  Orientated 3  Vascular: DP pulses 0/4 bilaterally PT pulses 0/4 bilaterally Capillary reflex delayed bilaterally  Neurological: Sensation to 10 g monofilament wire intact 5/5 right 4/5 left Vibratory sensation nonreactive bilaterally Ankle reflexes are equal reactive bilaterally  Dermatological: No open skin lesions bilaterally Nucleated keratoses plantar IPJ right hallux The toenails are extremely hypertrophic, elongated, discolored, deformity tender to direct palpation 6-10  Musculoskeletal: Patient walks slowly with a roller walker Pes planus bilaterally HAV bilaterally Hammertoe third right Is no crepitus or pain in range of motion ankle, subtalar, midtarsal joints bilaterally      Assessment & Plan:   Assessment: Absent pedal pulses consistent with peripheral arterial disease Decreased vibratory sensation consistent with peripheral neuropathy Neglected symptomatic onychomycoses 6-10 Porokeratosis 1  Plan: Debridement toenails 6-10 mechanically and elected without any bleeding Debride porokeratosis 1 without any bleeding  Reappoint 3 months

## 2016-01-05 NOTE — Patient Instructions (Signed)
Diabetes and Foot Care Diabetes may cause you to have problems because of poor blood supply (circulation) to your feet and legs. This may cause the skin on your feet to become thinner, break easier, and heal more slowly. Your skin may become dry, and the skin may peel and crack. You may also have nerve damage in your legs and feet causing decreased feeling in them. You may not notice minor injuries to your feet that could lead to infections or more serious problems. Taking care of your feet is one of the most important things you can do for yourself.  HOME CARE INSTRUCTIONS  Wear shoes at all times, even in the house. Do not go barefoot. Bare feet are easily injured.  Check your feet daily for blisters, cuts, and redness. If you cannot see the bottom of your feet, use a mirror or ask someone for help.  Wash your feet with warm water (do not use hot water) and mild soap. Then pat your feet and the areas between your toes until they are completely dry. Do not soak your feet as this can dry your skin.  Apply a moisturizing lotion or petroleum jelly (that does not contain alcohol and is unscented) to the skin on your feet and to dry, brittle toenails. Do not apply lotion between your toes.  Trim your toenails straight across. Do not dig under them or around the cuticle. File the edges of your nails with an emery board or nail file.  Do not cut corns or calluses or try to remove them with medicine.  Wear clean socks or stockings every day. Make sure they are not too tight. Do not wear knee-high stockings since they may decrease blood flow to your legs.  Wear shoes that fit properly and have enough cushioning. To break in new shoes, wear them for just a few hours a day. This prevents you from injuring your feet. Always look in your shoes before you put them on to be sure there are no objects inside.  Do not cross your legs. This may decrease the blood flow to your feet.  If you find a minor scrape,  cut, or break in the skin on your feet, keep it and the skin around it clean and dry. These areas may be cleansed with mild soap and water. Do not cleanse the area with peroxide, alcohol, or iodine.  When you remove an adhesive bandage, be sure not to damage the skin around it.  If you have a wound, look at it several times a day to make sure it is healing.  Do not use heating pads or hot water bottles. They may burn your skin. If you have lost feeling in your feet or legs, you may not know it is happening until it is too late.  Make sure your health care provider performs a complete foot exam at least annually or more often if you have foot problems. Report any cuts, sores, or bruises to your health care provider immediately. SEEK MEDICAL CARE IF:   You have an injury that is not healing.  You have cuts or breaks in the skin.  You have an ingrown nail.  You notice redness on your legs or feet.  You feel burning or tingling in your legs or feet.  You have pain or cramps in your legs and feet.  Your legs or feet are numb.  Your feet always feel cold. SEEK IMMEDIATE MEDICAL CARE IF:   There is increasing redness,   swelling, or pain in or around a wound.  There is a red line that goes up your leg.  Pus is coming from a wound.  You develop a fever or as directed by your health care provider.  You notice a bad smell coming from an ulcer or wound.   This information is not intended to replace advice given to you by your health care provider. Make sure you discuss any questions you have with your health care provider.   Document Released: 08/12/2000 Document Revised: 04/17/2013 Document Reviewed: 01/22/2013 Elsevier Interactive Patient Education 2016 [employer]  

## 2016-02-16 ENCOUNTER — Ambulatory Visit (INDEPENDENT_AMBULATORY_CARE_PROVIDER_SITE_OTHER): Payer: Medicare Other | Admitting: Cardiovascular Disease

## 2016-02-16 ENCOUNTER — Encounter: Payer: Self-pay | Admitting: Cardiovascular Disease

## 2016-02-16 VITALS — BP 142/82 | HR 84 | Ht 60.0 in | Wt 116.8 lb

## 2016-02-16 DIAGNOSIS — I10 Essential (primary) hypertension: Secondary | ICD-10-CM

## 2016-02-16 DIAGNOSIS — E785 Hyperlipidemia, unspecified: Secondary | ICD-10-CM | POA: Diagnosis not present

## 2016-02-16 DIAGNOSIS — I739 Peripheral vascular disease, unspecified: Secondary | ICD-10-CM

## 2016-02-16 NOTE — Assessment & Plan Note (Signed)
History of hyperlipidemia on statin therapy followed by her PCP. 

## 2016-02-16 NOTE — Assessment & Plan Note (Signed)
History of peripheral vascular disease status post bilateral iliac artery PTA and stenting by myself in 2009 with known short segment occlusion of the mid right SFA. Dopplers have remained stable and the patient denies claudication.

## 2016-02-16 NOTE — Progress Notes (Signed)
02/16/2016 [name]   1946/03/23  [id number]  Primary Physician Philis Fendt, MD Primary Cardiologist: [name] MD Lupe Carney, [place]  HPI:  The patient is a 70year-old mildly overweight single [place] American female with no children, who I last saw 02/10/15 . She has a history of remote alcohol and tobacco abuse, having quit 6 to 7 years ago. She smoked 50 pack years prior to that and does have COPD. She had PVOD, status post bilateral iliac artery PTA and stenting back in 2009 with a known short-segment occlusion from mid right SFA. She does get some calf claudication, but denies chest pain or shortness of breath. She does have chronic renal insufficiency and chronic pancreatitis as well as alcoholic hepatitis and type 2 diabetes. Her last Myoview, performed March 18, 2011, was not ischemic. Her most recent arterial Dopplers, performed April 16, 2012, revealed ABIs of 0.77 bilaterally with patent iliac stents and short-segment occlusion, mid right SFA, unchanged from prior Doppler study. She saw [name], PA-C in the office a week ago with similar complaints. She denies chest pain or shortness of breath.she saw [name] registered nurse practitioner in the office after that with left knee pain. Since I saw her in the office12 months ago, she denies chest pain, shortness of breath or claudication.   Current Outpatient Prescriptions  Medication Sig Dispense Refill  . albuterol (PROVENTIL HFA;VENTOLIN HFA) 108 (90 BASE) MCG/ACT inhaler Inhale 2 puffs into the lungs every 6 (six) hours as needed. For shortness of breath    . albuterol (PROVENTIL) (2.5 MG/3ML) 0.083% nebulizer solution Take 2.5 mg by nebulization every 6 (six) hours as needed for wheezing or shortness of breath.    . allopurinol (ZYLOPRIM) 100 MG tablet Take 100 mg by mouth 2 (two) times daily.    Marland Kitchen aspirin 81 MG EC tablet Take 81 mg by mouth daily.      . carvedilol (COREG) 12.5 MG tablet Take  12.5 mg by mouth 2 (two) times daily with a meal.    . cilostazol (PLETAL) 100 MG tablet Take 50 mg by mouth 2 (two) times daily before a meal.     . cilostazol (PLETAL) 50 MG tablet Take 1 tablet by mouth daily.    . cloNIDine (CATAPRES) 0.1 MG tablet Take 1 tablet (0.1 mg total) by mouth 3 (three) times daily. 90 tablet 11  . ergocalciferol (VITAMIN D2) 50000 UNITS capsule Take 50,000 Units by mouth every Monday. On Monday    . ferrous sulfate 325 (65 FE) MG tablet Take 325 mg by mouth daily with breakfast.    . fluticasone (FLONASE) 50 MCG/ACT nasal spray Place 2 sprays into the nose daily as needed for allergies.     . folic acid (FOLVITE) 1 MG tablet Take 1 tablet (1 mg total) by mouth daily. 30 tablet 0  . furosemide (LASIX) 20 MG tablet Take 1 tablet (20 mg total) by mouth daily. 30 tablet 0  . glucose monitoring kit (FREESTYLE) monitoring kit 1 each by Does not apply route 4 (four) times daily - after meals and at bedtime. 1 month Diabetic Testing Supplies for QAC-QHS accuchecks.Any brand OK 1 each 1  . insulin glargine (LANTUS) 100 UNIT/ML injection Inject 5 Units into the skin at bedtime.    . insulin lispro (HUMALOG) 100 UNIT/ML injection Inject 5 Units into the skin 2 (two) times daily before lunch and supper.    Marland Kitchen LANTUS SOLOSTAR 100 UNIT/ML Solostar Pen  Inject 5 Units as directed as directed.    Marland Kitchen levofloxacin (LEVAQUIN) 750 MG tablet Take 1 tablet (750 mg total) by mouth once. Last dose on 08/20/2015. 1 tablet 0  . magnesium oxide (MAG-OX) 400 MG tablet Take 400 mg by mouth daily.    . montelukast (SINGULAIR) 10 MG tablet Take 10 mg by mouth at bedtime.     Marland Kitchen omeprazole (PRILOSEC) 40 MG capsule Take 40 mg by mouth daily.     Marland Kitchen oxyCODONE (OXY IR/ROXICODONE) 5 MG immediate release tablet Take 1 tablet by mouth as directed.    . OXYGEN-HELIUM IN Inhale 2 L into the lungs at bedtime. 2 liters nightly    . simvastatin (ZOCOR) 10 MG tablet Take 10 mg by mouth daily.     . sitaGLIPtin  (JANUVIA) 50 MG tablet Take 1 tablet by mouth daily.    Marland Kitchen spironolactone (ALDACTONE) 25 MG tablet Take 0.5 tablets (12.5 mg total) by mouth daily. 30 tablet 0  . thiamine 100 MG tablet Take 1 tablet (100 mg total) by mouth daily. 30 tablet 0  . traMADol (ULTRAM) 50 MG tablet Take 1 tablet (50 mg total) by mouth every 6 (six) hours as needed. 15 tablet 0  . [DISCONTINUED] insulin aspart (NOVOLOG) 100 UNIT/ML injection Before each meal 3 times a day, 140-199 - 2 units, 200-250 - 4 units, 251-299 - 6 units,  300-349 - 8 units,  350 or above 10 units. Insulin PEN if approved, provide syringes and needles if needed. (Patient not taking: Reported on 05/09/2015) 10 mL 11   No current facility-administered medications for this visit.    Allergies  Allergen Reactions  . Penicillins Hives    Has patient had a PCN reaction causing immediate rash, facial/tongue/throat swelling, SOB or lightheadedness with hypotension: No Has patient had a PCN reaction causing severe rash involving mucus membranes or skin necrosis: No Has patient had a PCN reaction that required hospitalization No Has patient had a PCN reaction occurring within the last 10 years: No If all of the above answers are "NO", then may proceed with Cephalosporin use.   . Lipitor [Atorvastatin] Swelling  . Compazine [Prochlorperazine Edisylate] Other (See Comments)    Altered mental status  . Phenergan [Promethazine Hcl] Other (See Comments)    Altered mental status    Social History   Social History  . Marital Status: Single    Spouse Name: N/A  . Number of Children: N/A  . Years of Education: N/A   Occupational History  . Not on file.   Social History Main Topics  . Smoking status: Former Smoker -- 0.50 packs/day for 30 years    Types: Cigarettes    Quit date: 11/06/2005  . Smokeless tobacco: Never Used  . Alcohol Use: No     Comment: stopped 2005  . Drug Use: No  . Sexual Activity: Not on file   Other Topics Concern  .  Not on file   Social History Narrative     Review of Systems: General: negative for chills, fever, night sweats or weight changes.  Cardiovascular: negative for chest pain, dyspnea on exertion, edema, orthopnea, palpitations, paroxysmal nocturnal dyspnea or shortness of breath Dermatological: negative for rash Respiratory: negative for cough or wheezing Urologic: negative for hematuria Abdominal: negative for nausea, vomiting, diarrhea, bright red blood per rectum, melena, or hematemesis Neurologic: negative for visual changes, syncope, or dizziness All other systems reviewed and are otherwise negative except as noted above.    Blood pressure  142/82, pulse 84, height 5' (1.524 m), weight 116 lb 12.8 oz (52.98 kg).  General appearance: alert and no distress Neck: no adenopathy, no carotid bruit, no JVD, supple, symmetrical, trachea midline and thyroid not enlarged, symmetric, no tenderness/mass/nodules Lungs: clear to auscultation bilaterally Heart: regular rate and rhythm, S1, S2 normal, no murmur, click, rub or gallop Extremities: extremities normal, atraumatic, no cyanosis or edema  EKG normal sinus rhythm at 84 without ST or T-wave changes. I personally reviewed this EKG  ASSESSMENT AND PLAN:   PVD, LEIA/LIIA and RCIA pta 7/09- ABIs 0.68 and 0.69 Feb 2014 History of peripheral vascular disease status post bilateral iliac artery PTA and stenting by myself in 2009 with known short segment occlusion of the mid right SFA. Dopplers have remained stable and the patient denies claudication.  Essential hypertension History of hypertension blood pressure measured at 101/70. She is on carvedilol, clonidine spironolactone. Continue current meds at current dosing  Hyperlipidemia History of hyperlipidemia on statin therapy followed by her PCP      [name] MD [facility], [facility] 02/16/2016 4:19 PM

## 2016-02-16 NOTE — Assessment & Plan Note (Signed)
History of hypertension blood pressure measured at 101/70. She is on carvedilol, clonidine spironolactone. Continue current meds at current dosing

## 2016-02-16 NOTE — Patient Instructions (Signed)
Medication Instructions:  Your physician recommends that you continue on your current medications as directed. Please refer to the Current Medication list given to you today.   Labwork: none  Testing/Procedures: none  Follow-Up: Your physician wants you to follow-up in: [place]. You will receive a reminder letter in the mail two months in advance. If you don't receive a letter, please call our office to schedule the follow-up appointment.   Any Other Special Instructions Will Be Listed Below (If Applicable).     If you need a refill on your cardiac medications before your next appointment, please call your pharmacy.

## 2016-02-17 ENCOUNTER — Emergency Department (HOSPITAL_COMMUNITY): Payer: Medicare Other

## 2016-02-17 ENCOUNTER — Other Ambulatory Visit: Payer: Self-pay

## 2016-02-17 ENCOUNTER — Emergency Department (HOSPITAL_COMMUNITY)
Admission: EM | Admit: 2016-02-17 | Discharge: 2016-02-17 | Disposition: A | Discharge status: Home / Self Care | Payer: Medicare Other | Attending: Emergency Medicine | Admitting: Emergency Medicine

## 2016-02-17 DIAGNOSIS — Z79899 Other long term (current) drug therapy: Secondary | ICD-10-CM | POA: Diagnosis not present

## 2016-02-17 DIAGNOSIS — Z7984 Long term (current) use of oral hypoglycemic drugs: Secondary | ICD-10-CM | POA: Diagnosis not present

## 2016-02-17 DIAGNOSIS — I110 Hypertensive heart disease with heart failure: Secondary | ICD-10-CM | POA: Diagnosis not present

## 2016-02-17 DIAGNOSIS — M25512 Pain in left shoulder: Secondary | ICD-10-CM | POA: Diagnosis not present

## 2016-02-17 DIAGNOSIS — Z87891 Personal history of nicotine dependence: Secondary | ICD-10-CM | POA: Diagnosis not present

## 2016-02-17 DIAGNOSIS — E119 Type 2 diabetes mellitus without complications: Secondary | ICD-10-CM | POA: Diagnosis not present

## 2016-02-17 DIAGNOSIS — I5032 Chronic diastolic (congestive) heart failure: Secondary | ICD-10-CM | POA: Insufficient documentation

## 2016-02-17 DIAGNOSIS — F329 Major depressive disorder, single episode, unspecified: Secondary | ICD-10-CM | POA: Insufficient documentation

## 2016-02-17 DIAGNOSIS — J449 Chronic obstructive pulmonary disease, unspecified: Secondary | ICD-10-CM | POA: Insufficient documentation

## 2016-02-17 DIAGNOSIS — Z7982 Long term (current) use of aspirin: Secondary | ICD-10-CM | POA: Insufficient documentation

## 2016-02-17 MED ORDER — OXYCODONE-ACETAMINOPHEN 5-325 MG PO TABS
1.0000 | ORAL_TABLET | Freq: Once | ORAL | Status: AC
  Administered 2016-02-17: 1 via ORAL
  Filled 2016-02-17: qty 1

## 2016-02-17 MED ORDER — METHOCARBAMOL 500 MG PO TABS
500.0000 mg | ORAL_TABLET | Freq: Once | ORAL | Status: AC
  Administered 2016-02-17: 500 mg via ORAL
  Filled 2016-02-17: qty 1

## 2016-02-17 MED ORDER — METHOCARBAMOL 500 MG PO TABS: 500.0000 mg | ORAL_TABLET | Freq: Three times a day (TID) | ORAL | Status: DC | PRN

## 2016-02-17 MED ORDER — OXYCODONE-ACETAMINOPHEN 5-325 MG PO TABS: 1.0000 | ORAL_TABLET | Freq: Three times a day (TID) | ORAL | Status: DC | PRN

## 2016-02-17 NOTE — ED Notes (Signed)
Social worker called for help finding ride home.

## 2016-02-17 NOTE — ED Provider Notes (Signed)
CSN: [id number]     Arrival date & time 02/17/16  1331 History   First MD Initiated Contact with Patient 02/17/16 1607     Chief Complaint  Patient presents with  . Arm Pain     (Consider location/radiation/quality/duration/timing/severity/associated sxs/prior Treatment) Patient is a 70 y.o. female presenting with shoulder pain.  Shoulder Pain Location:  Shoulder Injury: yes   Shoulder location:  L shoulder Pain details:    Quality:  Aching, sharp, pressure, cramping, throbbing, tearing and tingling   Radiates to:  L elbow   Severity:  Mild   Duration:  4 months   Timing:  Constant Chronicity:  New Handedness:  Right-handed Dislocation: no   Prior injury to area:  No Relieved by:  None tried Associated symptoms: no back pain     Past Medical History  Diagnosis Date  . Alcoholic cirrhosis ([place])   . Reflux esophagitis   . Gastroesophageal reflux   . Colon polyps   . CHF (congestive heart failure) ([place]) 10/13    grade 1 diastolic dysfunction, Nl LVF  . Hypertension   . History of GI diverticular bleed   . Pneumonia     history of  . Asthma   . Anemia   . Blood transfusion few years ago  . Villous adenoma of colon 05/10/11  . PVD (peripheral vascular disease) ([place]) 2009    bilat iliac stenting  . Chronic obstructive pulmonary disease (COPD) ([place])   . Arthritis   . Glaucoma     left  . Gout   . Acute venous embolism and thrombosis of deep vessels of proximal lower extremity ([place])   . Back pain   . Leg swelling   . Diabetes mellitus     insulin dep   . Hepatitis     alcoholic hepatitis  . Depression   . Anxiety   . On home oxygen therapy oxygen 2 liter per minute prn per [place]  . Cellulitis     bilateral lower extremities  . Chronic diastolic congestive heart failure [facility])    Past Surgical History  Procedure Laterality Date  . Cataract extraction Right yrs ago  . Colonoscopy    . Laparoscopic assissted total colectomy w/ j-pouch  04/22/11  . Ventral hernia  repair  06/13/2012    Procedure: LAPAROSCOPIC VENTRAL HERNIA;  Surgeon: Adin Hector, MD;  Location: [place];  Service: General;  Laterality: N/A;  Laparoscopic Assisted Ventral Hernia with Mesh  . Abdominal hysterectomy  yrs ago  . Esophagogastroduodenoscopy (egd) with propofol N/A 08/06/2013    Procedure: ESOPHAGOGASTRODUODENOSCOPY (EGD) WITH PROPOFOL;  Surgeon: Lear Ng, MD;  Location: [facility];  Service: Endoscopy;  Laterality: N/A;  . Colonoscopy with propofol N/A 08/06/2013    Procedure: COLONOSCOPY WITH PROPOFOL;  Surgeon: Lear Ng, MD;  Location: [facility];  Service: Endoscopy;  Laterality: N/A;   Family History  Problem Relation Age of Onset  . Cancer Brother     heent ca  . Cancer Sister     liver ca   Social History  Substance Use Topics  . Smoking status: Former Smoker -- 0.50 packs/day for 30 years    Types: Cigarettes    Quit date: 11/06/2005  . Smokeless tobacco: Never Used  . Alcohol Use: No     Comment: stopped 2005   OB History    No data available     Review of Systems  Musculoskeletal: Positive for myalgias and arthralgias. Negative for back pain.  All other  systems reviewed and are negative.     Allergies  Penicillins; Lipitor; Compazine; and Phenergan  Home Medications   Prior to Admission medications   Medication Sig Start Date End Date Taking? Authorizing Provider  albuterol (PROVENTIL HFA;VENTOLIN HFA) 108 (90 BASE) MCG/ACT inhaler Inhale 2 puffs into the lungs every 6 (six) hours as needed. For shortness of breath   Yes Historical Provider, MD  albuterol (PROVENTIL) (2.5 MG/3ML) 0.083% nebulizer solution Take 2.5 mg by nebulization every 6 (six) hours as needed for wheezing or shortness of breath.   Yes Historical Provider, MD  aspirin 81 MG EC tablet Take 81 mg by mouth daily.     Yes Historical Provider, MD  carvedilol (COREG) 12.5 MG tablet Take 12.5 mg by mouth 2 (two) times daily with a meal.   Yes Historical  Provider, MD  cilostazol (PLETAL) 100 MG tablet Take 50 mg by mouth 2 (two) times daily before a meal.    Yes Historical Provider, MD  cilostazol (PLETAL) 50 MG tablet Take 1 tablet by mouth daily. 01/18/16  Yes Historical Provider, MD  cloNIDine (CATAPRES) 0.1 MG tablet Take 1 tablet (0.1 mg total) by mouth 3 (three) times daily. 09/05/14  Yes [name], MD  ergocalciferol (VITAMIN D2) 50000 UNITS capsule Take 50,000 Units by mouth every Monday. On Monday   Yes Historical Provider, MD  ferrous sulfate 325 (65 FE) MG tablet Take 325 mg by mouth daily with breakfast.   Yes Historical Provider, MD  fluticasone (FLONASE) 50 MCG/ACT nasal spray Place 2 sprays into the nose daily as needed for allergies.    Yes Historical Provider, MD  folic acid (FOLVITE) 1 MG tablet Take 1 tablet (1 mg total) by mouth daily. 08/18/15  Yes Modena Jansky, MD  furosemide (LASIX) 20 MG tablet Take 1 tablet (20 mg total) by mouth daily. 08/18/15  Yes Modena Jansky, MD  LANTUS SOLOSTAR 100 UNIT/ML Solostar Pen Inject 5 Units as directed at bedtime.  01/18/16  Yes Historical Provider, MD  lisinopril (PRINIVIL,ZESTRIL) 5 MG tablet Take 5 mg by mouth daily. 01/18/16  Yes Historical Provider, MD  magnesium oxide (MAG-OX) 400 MG tablet Take 400 mg by mouth daily.   Yes Historical Provider, MD  montelukast (SINGULAIR) 10 MG tablet Take 10 mg by mouth at bedtime.    Yes Historical Provider, MD  omeprazole (PRILOSEC) 40 MG capsule Take 40 mg by mouth daily.    Yes Historical Provider, MD  oxyCODONE (OXY IR/ROXICODONE) 5 MG immediate release tablet Take 1 tablet by mouth every 4 (four) hours as needed for moderate pain.  01/20/16  Yes Historical Provider, MD  OXYGEN-HELIUM IN Inhale 2 L into the lungs at bedtime. 2 liters nightly   Yes Historical Provider, MD  simvastatin (ZOCOR) 10 MG tablet Take 10 mg by mouth daily.  11/30/14  Yes Historical Provider, MD  sitaGLIPtin (JANUVIA) 50 MG tablet Take 1 tablet by mouth daily.  03/27/14  Yes Historical Provider, MD  spironolactone (ALDACTONE) 25 MG tablet Take 0.5 tablets (12.5 mg total) by mouth daily. 08/18/15  Yes Modena Jansky, MD  thiamine 100 MG tablet Take 1 tablet (100 mg total) by mouth daily. 08/18/15  Yes Modena Jansky, MD  traMADol (ULTRAM) 50 MG tablet Take 1 tablet (50 mg total) by mouth every 6 (six) hours as needed. Patient taking differently: Take 50 mg by mouth every 6 (six) hours as needed for moderate pain.  06/02/15  Yes Carmin Muskrat, MD  allopurinol (  ZYLOPRIM) 100 MG tablet Take 100 mg by mouth 2 (two) times daily. 01/18/16   Historical Provider, MD  methocarbamol (ROBAXIN) 500 MG tablet Take 1 tablet (500 mg total) by mouth every 8 (eight) hours as needed for muscle spasms. 02/17/16   [name], MD  metoprolol (LOPRESSOR) 50 MG tablet Take 50 mg by mouth daily. 01/18/16   Historical Provider, MD  oxyCODONE-acetaminophen (PERCOCET/ROXICET) 5-325 MG tablet Take 1 tablet by mouth every 8 (eight) hours as needed for severe pain. 02/17/16   [name], MD   BP 142/89 mmHg  Pulse 81  Temp(Src) 98.1 F (36.7 C) (Oral)  Resp 14  Ht 5' (1.524 m)  Wt 113 lb (51.256 kg)  BMI 22.07 kg/m2  SpO2 95% Physical Exam  Constitutional: She is oriented to person, place, and time. She appears well-developed and well-nourished.  HENT:  Head: Normocephalic and atraumatic.  Neck: Normal range of motion.  Cardiovascular: Normal rate and regular rhythm.   Pulmonary/Chest: No stridor. No respiratory distress.  Abdominal: Soft. She exhibits no distension. There is no tenderness.  Neurological: She is alert and oriented to person, place, and time.  Skin: Skin is warm and dry.  Nursing note and vitals reviewed.   ED Course  Procedures (including critical care time) Labs Review Labs Reviewed - No data to display  Imaging Review Dg Shoulder Left  02/17/2016  CLINICAL DATA:  Left shoulder pain.  No known injury. EXAM: LEFT SHOULDER - 2+ VIEW COMPARISON:   08/11/2015 FINDINGS: Degenerative changes in the left shoulder with joint space loss and spurring in the glenohumeral joint. No acute bony abnormality. Specifically, no fracture, subluxation, or dislocation. Soft tissues are intact. IMPRESSION: Degenerative changes.  No acute bony abnormality. Electronically Signed   By: Rolm Baptise M.D.   On: 02/17/2016 18:05   I have personally reviewed and evaluated these images and lab results as part of my medical decision-making.   EKG Interpretation None      MDM   Final diagnoses:  Left shoulder pain   msk spasm v frozen shoulder. Will xr and rx for PT and muscle relaxers.   xr ok. Symptoms improved, BP improved with pain control. Will get PT to the home to help with rom exercises.   New Prescriptions: New Prescriptions   METHOCARBAMOL (ROBAXIN) 500 MG TABLET    Take 1 tablet (500 mg total) by mouth every 8 (eight) hours as needed for muscle spasms.   OXYCODONE-ACETAMINOPHEN (PERCOCET/ROXICET) 5-325 MG TABLET    Take 1 tablet by mouth every 8 (eight) hours as needed for severe pain.     I have personally and contemperaneously reviewed labs and imaging and used in my decision making as above.   A medical screening exam was performed and I feel the patient has had an appropriate workup for their chief complaint at this time and likelihood of emergent condition existing is low and thus workup can continue on an outpatient basis.. Their vital signs are stable. They have been counseled on decision, discharge, follow up and which symptoms necessitate immediate return to the emergency department.  They verbally stated understanding and agreement with plan and discharged in stable condition.      [name], MD 02/17/16 [phone]

## 2016-02-17 NOTE — ED Notes (Addendum)
Per EMS. Pt from home. Pt reports L arm pain that is worse to touch that woke her up at 0500. Tried home oxycodone with no relief. Also acted as if L arm was hurting as well with EMS. When I spoke with pt she reports pain radiates down from neck and alternates between arms.

## 2016-02-17 NOTE — Care Management Note (Signed)
Case Management Note  Patient Details  Name: DELFINA SCHREURS MRN: [id number] Date of Birth: 1946-03-12  Subjective/Objective:  [facility] consulted for home health services               Action/Plan: Discussed home health services with patient.  Patient chooses [facility] for home health services as she has had them in the past.  Patient lives alone, has an aide who comes to her home for 2 hours /day.  Patient reports she ambulates with a walker, and has a shower bench in her bath tub.  Patient reports she makes her own food and has meals on wheels.  She reports she obtains transportation for her appointments.  Patient confirms her pcp is Dr. Luretha Rued.  Patient would like her aide to stay more hours.  EDCM instructed patient to contact her pcp to assist with increasing her aide hours.  Patient is very happy where she lives, refuses ALF.  Discussed patient with EDP.  [facility] faxed [facility] health referral to [facility] with confirmation of receipt.  No further EDCM needs at this time.   Expected Discharge Date:                  Expected Discharge Plan:  [place]  In-House Referral:     Discharge planning Services  CM Consult  Post Acute Care Choice:  Home Health Choice offered to:  Patient  DME Arranged:   (none required) DME Agency:     HH Arranged:  PT [place]:  [facility] (now [facility])  Status of Service:  Completed, signed off  If discussed at [place] of Stay Meetings, dates discussed:    Additional CommentsLivia Snellen, RN 02/17/2016, 6:58 PM

## 2016-02-17 NOTE — ED Notes (Signed)
Patient was alert, oriented and stable upon discharge. RN went over AVS and patient had no further questions.  

## 2016-02-17 NOTE — ED Notes (Signed)
Pt's home health aide showed up to pick pt up.  Pt wheeled out to lobby.

## 2016-04-12 ENCOUNTER — Ambulatory Visit (INDEPENDENT_AMBULATORY_CARE_PROVIDER_SITE_OTHER): Payer: Medicare Other | Admitting: Podiatry

## 2016-04-12 ENCOUNTER — Encounter: Payer: Self-pay | Admitting: Podiatry

## 2016-04-12 DIAGNOSIS — B351 Tinea unguium: Secondary | ICD-10-CM | POA: Diagnosis not present

## 2016-04-12 DIAGNOSIS — M79674 Pain in right toe(s): Secondary | ICD-10-CM

## 2016-04-12 DIAGNOSIS — Q828 Other specified congenital malformations of skin: Secondary | ICD-10-CM | POA: Diagnosis not present

## 2016-04-12 DIAGNOSIS — M79675 Pain in left toe(s): Secondary | ICD-10-CM | POA: Diagnosis not present

## 2016-04-12 NOTE — Progress Notes (Signed)
Complaint:  Visit Type: Patient returns to my office for continued preventative foot care services. Complaint: Patient states" my nails have grown long and thick and become painful to walk and wear shoes" Patient has been diagnosed with DM with angiopathy. The patient presents for preventative foot care services. No changes to ROS  Podiatric Exam: Vascular: dorsalis pedis and posterior tibial pulses are not  palpable bilateral. Capillary return is immediate. Temperature gradient is diminished. Sensorium: Normal Semmes Weinstein monofilament test. Normal tactile sensation bilaterally. Nail Exam: Pt has thick disfigured discolored nails with subungual debris noted bilateral entire nail hallux through fifth toenails Ulcer Exam: There is no evidence of ulcer or pre-ulcerative changes or infection. Orthopedic Exam: Muscle tone and strength are WNL. No limitations in general ROM. No crepitus or effusions noted. Foot type and digits show no abnormalities. Pes planus  HAV  B/L. Skin: No Porokeratosis. No infection or ulcers  Diagnosis:  Onychomycosis, , Pain in right toe, pain in left toes  Treatment & Plan Procedures and Treatment: Consent by patient was obtained for treatment procedures. The patient understood the discussion of treatment and procedures well. All questions were answered thoroughly reviewed. Debridement of mycotic and hypertrophic toenails, 1 through 5 bilateral and clearing of subungual debris. No ulceration, no infection noted.  Return Visit-Office Procedure: Patient instructed to return to the office for a follow up visit 3 months for continued evaluation and treatment.    Carston Riedl DPM 

## 2016-07-19 ENCOUNTER — Encounter: Payer: Self-pay | Admitting: Podiatry

## 2016-07-19 ENCOUNTER — Ambulatory Visit (INDEPENDENT_AMBULATORY_CARE_PROVIDER_SITE_OTHER): Payer: Medicare Other | Admitting: Podiatry

## 2016-07-19 VITALS — Ht 60.0 in | Wt 116.0 lb

## 2016-07-19 DIAGNOSIS — M79674 Pain in right toe(s): Secondary | ICD-10-CM

## 2016-07-19 DIAGNOSIS — M79675 Pain in left toe(s): Secondary | ICD-10-CM

## 2016-07-19 DIAGNOSIS — B351 Tinea unguium: Secondary | ICD-10-CM

## 2016-07-19 NOTE — Progress Notes (Signed)
Complaint:  Visit Type: Patient returns to my office for continued preventative foot care services. Complaint: Patient states" my nails have grown long and thick and become painful to walk and wear shoes" Patient has been diagnosed with DM with angiopathy. The patient presents for preventative foot care services. No changes to ROS  Podiatric Exam: Vascular: dorsalis pedis and posterior tibial pulses are not  palpable bilateral. Capillary return is immediate. Temperature gradient is diminished. Sensorium: Normal Semmes Weinstein monofilament test. Normal tactile sensation bilaterally. Nail Exam: Pt has thick disfigured discolored nails with subungual debris noted bilateral entire nail hallux through fifth toenails Ulcer Exam: There is no evidence of ulcer or pre-ulcerative changes or infection. Orthopedic Exam: Muscle tone and strength are WNL. No limitations in general ROM. No crepitus or effusions noted. Foot type and digits show no abnormalities. Pes planus  HAV  B/L. Skin: No Porokeratosis. No infection or ulcers  Diagnosis:  Onychomycosis, , Pain in right toe, pain in left toes  Treatment & Plan Procedures and Treatment: Consent by patient was obtained for treatment procedures. The patient understood the discussion of treatment and procedures well. All questions were answered thoroughly reviewed. Debridement of mycotic and hypertrophic toenails, 1 through 5 bilateral and clearing of subungual debris. No ulceration, no infection noted.  Return Visit-Office Procedure: Patient instructed to return to the office for a follow up visit 3 months for continued evaluation and treatment.    Jenesa Foresta DPM 

## 2016-09-23 ENCOUNTER — Ambulatory Visit: Payer: Medicare Other | Admitting: Podiatry

## 2016-09-27 ENCOUNTER — Ambulatory Visit: Payer: Medicare Other | Admitting: Podiatry

## 2016-09-30 ENCOUNTER — Ambulatory Visit: Payer: Medicare Other | Admitting: Podiatry

## 2016-10-21 ENCOUNTER — Ambulatory Visit (INDEPENDENT_AMBULATORY_CARE_PROVIDER_SITE_OTHER): Payer: Medicare Other | Admitting: Podiatry

## 2016-10-21 ENCOUNTER — Encounter: Payer: Self-pay | Admitting: Podiatry

## 2016-10-21 VITALS — Ht 60.0 in | Wt 116.0 lb

## 2016-10-21 DIAGNOSIS — Q828 Other specified congenital malformations of skin: Secondary | ICD-10-CM

## 2016-10-21 DIAGNOSIS — B351 Tinea unguium: Secondary | ICD-10-CM

## 2016-10-21 DIAGNOSIS — M79674 Pain in right toe(s): Secondary | ICD-10-CM

## 2016-10-21 DIAGNOSIS — M79675 Pain in left toe(s): Secondary | ICD-10-CM

## 2016-10-21 NOTE — Progress Notes (Signed)
Complaint:  Visit Type: Patient returns to my office for continued preventative foot care services. Complaint: Patient states" my nails have grown long and thick and become painful to walk and wear shoes" Patient has been diagnosed with DM with angiopathy. The patient presents for preventative foot care services. No changes to ROS  Podiatric Exam: Vascular: dorsalis pedis and posterior tibial pulses are not  palpable bilateral. Capillary return is immediate. Temperature gradient is diminished. Sensorium: Normal Semmes Weinstein monofilament test. Normal tactile sensation bilaterally. Nail Exam: Pt has thick disfigured discolored nails with subungual debris noted bilateral entire nail hallux through fifth toenails Ulcer Exam: There is no evidence of ulcer or pre-ulcerative changes or infection. Orthopedic Exam: Muscle tone and strength are WNL. No limitations in general ROM. No crepitus or effusions noted. Foot type and digits show no abnormalities. Pes planus  HAV  B/L. Skin: No Porokeratosis. No infection or ulcers  Diagnosis:  Onychomycosis, , Pain in right toe, pain in left toes  Treatment & Plan Procedures and Treatment: Consent by patient was obtained for treatment procedures. The patient understood the discussion of treatment and procedures well. All questions were answered thoroughly reviewed. Debridement of mycotic and hypertrophic toenails, 1 through 5 bilateral and clearing of subungual debris. No ulceration, no infection noted.  Return Visit-Office Procedure: Patient instructed to return to the office for a follow up visit 3 months for continued evaluation and treatment.    Hawa Henly DPM 

## 2016-11-01 ENCOUNTER — Other Ambulatory Visit: Payer: Self-pay | Admitting: Internal Medicine

## 2016-11-01 DIAGNOSIS — Z1231 Encounter for screening mammogram for malignant neoplasm of breast: Secondary | ICD-10-CM

## 2016-11-01 DIAGNOSIS — E2839 Other primary ovarian failure: Secondary | ICD-10-CM

## 2016-12-12 ENCOUNTER — Ambulatory Visit
Admission: RE | Admit: 2016-12-12 | Discharge: 2016-12-12 | Disposition: A | Discharge status: Home / Self Care | Payer: Medicare Other | Source: Ambulatory Visit | Attending: Internal Medicine | Admitting: Internal Medicine

## 2016-12-12 ENCOUNTER — Ambulatory Visit: Payer: Self-pay

## 2016-12-12 DIAGNOSIS — E2839 Other primary ovarian failure: Secondary | ICD-10-CM

## 2016-12-12 DIAGNOSIS — Z1231 Encounter for screening mammogram for malignant neoplasm of breast: Secondary | ICD-10-CM

## 2017-01-20 ENCOUNTER — Ambulatory Visit (INDEPENDENT_AMBULATORY_CARE_PROVIDER_SITE_OTHER): Payer: Medicare Other | Admitting: Podiatry

## 2017-01-20 ENCOUNTER — Encounter: Payer: Self-pay | Admitting: Podiatry

## 2017-01-20 DIAGNOSIS — M79676 Pain in unspecified toe(s): Secondary | ICD-10-CM | POA: Diagnosis not present

## 2017-01-20 DIAGNOSIS — B351 Tinea unguium: Secondary | ICD-10-CM

## 2017-01-20 DIAGNOSIS — E1159 Type 2 diabetes mellitus with other circulatory complications: Secondary | ICD-10-CM

## 2017-01-20 NOTE — Progress Notes (Signed)
Complaint:  Visit Type: Patient returns to my office for continued preventative foot care services. Complaint: Patient states" my nails have grown long and thick and become painful to walk and wear shoes" Patient has been diagnosed with DM with angiopathy. The patient presents for preventative foot care services. No changes to ROS  Podiatric Exam: Vascular: dorsalis pedis and posterior tibial pulses are not  palpable bilateral. Capillary return is immediate. Temperature gradient is diminished. Sensorium: Normal Semmes Weinstein monofilament test. Normal tactile sensation bilaterally. Nail Exam: Pt has thick disfigured discolored nails with subungual debris noted bilateral entire nail hallux through fifth toenails Ulcer Exam: There is no evidence of ulcer or pre-ulcerative changes or infection. Orthopedic Exam: Muscle tone and strength are WNL. No limitations in general ROM. No crepitus or effusions noted. Foot type and digits show no abnormalities. Pes planus  HAV  B/L. Skin: No Porokeratosis. No infection or ulcers  Diagnosis:  Onychomycosis, , Pain in right toe, pain in left toes  Treatment & Plan Procedures and Treatment: Consent by patient was obtained for treatment procedures. The patient understood the discussion of treatment and procedures well. All questions were answered thoroughly reviewed. Debridement of mycotic and hypertrophic toenails, 1 through 5 bilateral and clearing of subungual debris. No ulceration, no infection noted.  Return Visit-Office Procedure: Patient instructed to return to the office for a follow up visit 3 months for continued evaluation and treatment.    Gardiner Barefoot DPM

## 2017-01-24 ENCOUNTER — Emergency Department (HOSPITAL_COMMUNITY): Payer: Medicare Other

## 2017-01-24 ENCOUNTER — Emergency Department (HOSPITAL_COMMUNITY)
Admission: EM | Admit: 2017-01-24 | Discharge: 2017-01-24 | Disposition: A | Discharge status: Home / Self Care | Payer: Medicare Other | Attending: Emergency Medicine | Admitting: Emergency Medicine

## 2017-01-24 ENCOUNTER — Encounter (HOSPITAL_COMMUNITY): Payer: Self-pay

## 2017-01-24 DIAGNOSIS — M79672 Pain in left foot: Secondary | ICD-10-CM | POA: Diagnosis present

## 2017-01-24 DIAGNOSIS — J449 Chronic obstructive pulmonary disease, unspecified: Secondary | ICD-10-CM | POA: Diagnosis not present

## 2017-01-24 DIAGNOSIS — I5032 Chronic diastolic (congestive) heart failure: Secondary | ICD-10-CM | POA: Diagnosis not present

## 2017-01-24 DIAGNOSIS — F17210 Nicotine dependence, cigarettes, uncomplicated: Secondary | ICD-10-CM | POA: Diagnosis not present

## 2017-01-24 DIAGNOSIS — Z7982 Long term (current) use of aspirin: Secondary | ICD-10-CM | POA: Insufficient documentation

## 2017-01-24 DIAGNOSIS — M79675 Pain in left toe(s): Secondary | ICD-10-CM | POA: Diagnosis not present

## 2017-01-24 DIAGNOSIS — E119 Type 2 diabetes mellitus without complications: Secondary | ICD-10-CM | POA: Insufficient documentation

## 2017-01-24 DIAGNOSIS — Z79899 Other long term (current) drug therapy: Secondary | ICD-10-CM | POA: Insufficient documentation

## 2017-01-24 LAB — CBG MONITORING, ED: Glucose-Capillary: 258 mg/dL — ABNORMAL HIGH (ref 65–99)

## 2017-01-24 MED ORDER — INSULIN GLARGINE 100 UNIT/ML ~~LOC~~ SOLN
15.0000 [IU] | Freq: Once | SUBCUTANEOUS | Status: AC
  Administered 2017-01-24: 15 [IU] via SUBCUTANEOUS
  Filled 2017-01-24: qty 0.15

## 2017-01-24 MED ORDER — HYDROCODONE-ACETAMINOPHEN 5-325 MG PO TABS: 1.0000 | ORAL_TABLET | Freq: Four times a day (QID) | ORAL | 0 refills | Status: DC | PRN

## 2017-01-24 MED ORDER — HYDROCODONE-ACETAMINOPHEN 5-325 MG PO TABS
1.0000 | ORAL_TABLET | Freq: Once | ORAL | Status: AC
  Administered 2017-01-24: 1 via ORAL
  Filled 2017-01-24: qty 1

## 2017-01-24 MED ORDER — INSULIN GLARGINE 100 UNIT/ML ~~LOC~~ SOLN
5.0000 [IU] | Freq: Once | SUBCUTANEOUS | Status: DC
  Filled 2017-01-24: qty 0.05

## 2017-01-24 NOTE — ED Triage Notes (Signed)
Per PTAR, Pt is coming from home with complaints of stubbing her toe yesterday. Pt saw podiatrist and he diod not evaluate. Hx of gout and diabetes. No injury seen upon assessment, but pt reports 10/10 pain that has continued. Vitals per PTAR 140/82, 102 Hr, 92% on RA, 16 RR, 340 CBG

## 2017-01-24 NOTE — ED Provider Notes (Signed)
[facility] DEPT Provider Note   CSN: [id number] Arrival date & time: 01/24/17  1016     History   Chief Complaint Chief Complaint  Patient presents with  . Foot Pain    HPI JALEYA PEBLEY is a 71 y.o. female.  71 year old female with extensive past medical history below who presents with left toe and foot pain. Several days ago she stubbed her left great toe and since then has had ongoing pain and swelling in that area as well as in her left foot. She saw her podiatrist the day that it happened and had some work on her nails but no workup or treatment of the toe pain. Pain is worse when she tries to walk on it. She has distant history of  She was noted to be hyperglycemic this morning but states that she has not had her morning dose of 15 units of Lantus yet. She denies any fevers. She has chronic swelling of L ankle due to previous surgery.   The history is provided by the patient.    Past Medical History:  Diagnosis Date  . Acute venous embolism and thrombosis of deep vessels of proximal lower extremity ([place])   . Alcoholic cirrhosis ([place])   . Anemia   . Anxiety   . Arthritis   . Asthma   . Back pain   . Blood transfusion few years ago  . Cellulitis    bilateral lower extremities  . CHF (congestive heart failure) ([place]) 10/13   grade 1 diastolic dysfunction, Nl LVF  . Chronic diastolic congestive heart failure ([place])   . Chronic obstructive pulmonary disease (COPD) ([place])   . Colon polyps   . Depression   . Diabetes mellitus    insulin dep   . Gastroesophageal reflux   . Glaucoma    left  . Gout   . Hepatitis    alcoholic hepatitis  . History of GI diverticular bleed   . Hypertension   . Leg swelling   . On home oxygen therapy oxygen 2 liter per minute prn per [place]  . Pneumonia    history of  . PVD (peripheral vascular disease) ([place]) 2009   bilat iliac stenting  . Reflux esophagitis   . Villous adenoma of colon 05/10/11    Patient Active Problem List   Diagnosis Date Noted  . Gout 08/10/2015  . Hypoglycemia   . Hypothermia   . UTI (lower urinary tract infection)   . Humeral head fracture 06/05/2015  . Idiopathic chronic gout of multiple sites without tophus 06/05/2015  . Chronic renal disease, stage III 06/05/2015  . Nausea vomiting and diarrhea 05/09/2015  . Increased urinary frequency 05/09/2015  . Sepsis ([place]) 05/09/2015  . Hypokalemia   . Chronic diastolic congestive heart failure ([place])   . Hyperlipidemia 02/10/2015  . Cellulitis 11/04/2014  . Essential hypertension 08/27/2014  . Abdominal pain, acute, bilateral lower quadrant 07/26/2014  . Colitis 07/26/2014  . GI bleeding 06/14/2014  . GI bleed 06/14/2014  . Acute encephalopathy 06/14/2014  . Personal history of colonic polyps 08/06/2013  . Knee pain, acute 05/20/2013  . Claudication ([place]) 03/13/2013  . Obesity 03/13/2013  . PVD, LEIA/LIIA and RCIA pta 7/09- ABIs 0.68 and 0.69 Feb 2014 11/17/2012  . PUD (peptic ulcer disease) GI bleed in past (2011) 11/17/2012  . Cirrhosis of liver ([place]) 11/17/2012  . Colon cancer, lap chole 2012 11/17/2012  . 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 Atypical chest pain- no history  of CAD. Myoview low risk 2012 11/15/2012  . Controlled diabetes mellitus type 2 with complications ([place]) 62/37/6283  . Acute renal failure superimposed on stage 3 chronic kidney disease ([place]) 06/16/2012  . Incisional hernia 01/09/2012  . Anemia 01/28/2011  . COPD (chronic obstructive pulmonary disease) ([place]) 01/28/2011  . Sleep apnea 01/28/2011  . Transfusion history 01/28/2011  . Sinus problem 01/28/2011  . Full dentures 01/28/2011    Past Surgical History:  Procedure Laterality Date  . ABDOMINAL HYSTERECTOMY  yrs ago  . CATARACT EXTRACTION Right yrs ago  . COLONOSCOPY    . COLONOSCOPY WITH PROPOFOL N/A 08/06/2013   Procedure: COLONOSCOPY WITH PROPOFOL;  Surgeon: Lear Ng, MD;  Location: [facility] ENDOSCOPY;  Service:  Endoscopy;  Laterality: N/A;  . ESOPHAGOGASTRODUODENOSCOPY (EGD) WITH PROPOFOL N/A 08/06/2013   Procedure: ESOPHAGOGASTRODUODENOSCOPY (EGD) WITH PROPOFOL;  Surgeon: Lear Ng, MD;  Location: [facility] ENDOSCOPY;  Service: Endoscopy;  Laterality: N/A;  . LAPAROSCOPIC ASSISSTED TOTAL COLECTOMY W/ J-POUCH  04/22/11  . VENTRAL HERNIA REPAIR  06/13/2012   Procedure: LAPAROSCOPIC VENTRAL HERNIA;  Surgeon: Adin Hector, MD;  Location: [facility];  Service: General;  Laterality: N/A;  Laparoscopic Assisted Ventral Hernia with Mesh    OB History    No data available       Home Medications    Prior to Admission medications   Medication Sig Start Date End Date Taking? Authorizing Provider  albuterol (PROVENTIL HFA;VENTOLIN HFA) 108 (90 BASE) MCG/ACT inhaler Inhale 2 puffs into the lungs every 6 (six) hours as needed. For shortness of breath   Yes [provider]  albuterol (PROVENTIL) (2.5 MG/3ML) 0.083% nebulizer solution Take 2.5 mg by nebulization every 6 (six) hours as needed for wheezing or shortness of breath.   Yes [provider]  allopurinol (ZYLOPRIM) 100 MG tablet Take 100 mg by mouth 2 (two) times daily. 01/18/16  Yes [provider]  aspirin 81 MG EC tablet Take 81 mg by mouth daily.     Yes [provider]  carvedilol (COREG) 12.5 MG tablet Take 12.5 mg by mouth 2 (two) times daily with a meal.   Yes [provider]  cilostazol (PLETAL) 50 MG tablet Take 50 mg by mouth daily.  01/18/16  Yes [provider]  cloNIDine (CATAPRES) 0.1 MG tablet Take 1 tablet (0.1 mg total) by mouth 3 (three) times daily. 09/05/14  Yes [name], MD  ergocalciferol (VITAMIN D2) 50000 UNITS capsule Take 50,000 Units by mouth every Monday. On Monday   Yes [provider]  ferrous sulfate 325 (65 FE) MG tablet Take 325 mg by mouth daily with breakfast.   Yes [provider]  fluticasone (FLONASE) 50 MCG/ACT nasal spray Place 2 sprays  into the nose daily as needed for allergies.    Yes [provider]  folic acid (FOLVITE) 1 MG tablet Take 1 tablet (1 mg total) by mouth daily. 08/18/15  Yes Hongalgi, Lenis Dickinson, MD  furosemide (LASIX) 20 MG tablet Take 1 tablet (20 mg total) by mouth daily. 08/18/15  Yes Hongalgi, Lenis Dickinson, MD  LANTUS SOLOSTAR 100 UNIT/ML Solostar Pen Inject 5 Units as directed at bedtime.  01/18/16  Yes [provider]  lisinopril (PRINIVIL,ZESTRIL) 5 MG tablet Take 5 mg by mouth daily. 01/18/16  Yes [provider]  magnesium oxide (MAG-OX) 400 MG tablet Take 400 mg by mouth daily.   Yes [provider]  methocarbamol (ROBAXIN) 500 MG tablet Take 1 tablet (500 mg total)  by mouth every 8 (eight) hours as needed for muscle spasms. 02/17/16  Yes Mesner, Corene Cornea, MD  metoprolol (LOPRESSOR) 50 MG tablet Take 50 mg by mouth daily. 01/18/16  Yes [provider]  montelukast (SINGULAIR) 10 MG tablet Take 10 mg by mouth at bedtime.    Yes [provider]  omeprazole (PRILOSEC) 40 MG capsule Take 40 mg by mouth daily.    Yes [provider]  OXYGEN-HELIUM IN Inhale 2 L into the lungs at bedtime. 2 liters nightly   Yes [provider]  simvastatin (ZOCOR) 10 MG tablet Take 10 mg by mouth daily.  11/30/14  Yes [provider]  sitaGLIPtin (JANUVIA) 50 MG tablet Take 1 tablet by mouth daily. 03/27/14  Yes [provider]  spironolactone (ALDACTONE) 25 MG tablet Take 0.5 tablets (12.5 mg total) by mouth daily. 08/18/15  Yes Hongalgi, Lenis Dickinson, MD  thiamine 100 MG tablet Take 1 tablet (100 mg total) by mouth daily. 08/18/15  Yes Hongalgi, Lenis Dickinson, MD  tiZANidine (ZANAFLEX) 2 MG tablet Take 2 mg by mouth 2 (two) times daily as needed. 01/04/17  Yes [provider]  traMADol (ULTRAM) 50 MG tablet Take 1 tablet (50 mg total) by mouth every 6 (six) hours as needed. Patient taking differently: Take 50 mg by mouth every 6 (six) hours as needed for  moderate pain.  06/02/15  Yes Carmin Muskrat, MD  HYDROcodone-acetaminophen (NORCO/VICODIN) 5-325 MG tablet Take 1-2 tablets by mouth every 6 (six) hours as needed for severe pain. 01/24/17   Girtha Kilgore, Wenda Overland, MD    Family History Family History  Problem Relation Age of Onset  . Cancer Brother        heent ca  . Cancer Sister        liver ca    Social History Social History  Substance Use Topics  . Smoking status: Current Some Day Smoker    Packs/day: 0.50    Years: 30.00    Types: Cigarettes    Last attempt to quit: 11/06/2005  . Smokeless tobacco: Never Used  . Alcohol use No     Comment: stopped 2005     Allergies   Compazine [prochlorperazine edisylate]; Penicillins; Phenergan [promethazine hcl]; and Lipitor [atorvastatin]   Review of Systems Review of Systems All other systems reviewed and are negative except that which was mentioned in HPI  Physical Exam Updated Vital Signs BP (!) 179/89   Pulse 90   Temp 98.3 F (36.8 C) (Oral)   Resp 18   Ht 5' (1.524 m)   Wt 64 kg (141 lb)   SpO2 90%   BMI 27.54 kg/m   Physical Exam  Constitutional: She is oriented to person, place, and time. She appears well-developed and well-nourished. No distress.  HENT:  Head: Normocephalic and atraumatic.  Moist mucous membranes  Eyes: Conjunctivae are normal.  Neck: Neck supple.  Cardiovascular: Normal rate, regular rhythm and normal heart sounds.   No murmur heard. Pulmonary/Chest: Effort normal and breath sounds normal.  Abdominal: Soft. Bowel sounds are normal. She exhibits no distension. There is no tenderness.  Musculoskeletal: She exhibits edema and tenderness.  Edema and ecchymosis L great toe extending to distal dorsal L foot with tenderness in this area; mild swelling of L ankle with healed scar on lateral malleolus; 2+ DP pulses  Neurological: She is alert and oriented to person, place, and time.  Fluent speech  Skin: Skin is warm and dry.  Psychiatric: She  has a normal  mood and affect. Judgment normal.  Nursing note and vitals reviewed.    ED Treatments / Results  Labs (all labs ordered are listed, but only abnormal results are displayed) Labs Reviewed  CBG MONITORING, ED - Abnormal; Notable for the following:       Result Value   Glucose-Capillary 258 (*)    All other components within normal limits    EKG  EKG Interpretation None       Radiology Dg Foot Complete Left  Result Date: 01/24/2017 CLINICAL DATA:  Left foot injury 3 days ago with pain and swelling over the metatarsal region as well as bruising of the great toe. Painful to bear weight. EXAM: LEFT FOOT - COMPLETE 3+ VIEW COMPARISON:  Rolled left foot series of Jan 17, 2011 FINDINGS: The bones are subjectively osteopenic. There is chronic irregularity of the tuft of the great toe but no acute fracture is observed. There is a mild hallux valgus contour of the first ray. There is old deformity of the distal third of the shaft of the fifth metatarsal and of the proximal phalanx of the fourth toe. Specific attention to the great toe reveals no acute fracture. The metatarsals exhibit no acute abnormalities. There are degenerative changes of the intertarsal joints predominantly at of the talo-navicular articulation. There is soft tissue swelling over the dorsum of the midfoot. IMPRESSION: Chronic changes are present as described. No acute fracture nor dislocation is observed. Electronically Signed   By: David  [place] M.D.   On: 01/24/2017 12:35    Procedures Procedures (including critical care time)  Medications Ordered in ED Medications  insulin glargine (LANTUS) injection 15 Units (15 Units Subcutaneous Given 01/24/17 1140)  HYDROcodone-acetaminophen (NORCO/VICODIN) 5-325 MG per tablet 1 tablet (1 tablet Oral Given 01/24/17 1327)     Initial Impression / Assessment and Plan / ED Course  I have reviewed the triage vital signs and the nursing notes.  Pertinent labs & imaging  results that were available during my care of the patient were reviewed by me and considered in my medical decision making (see chart for details).    PT w/ L great toe and L foot pain after stubbing toe several days ago. Neurovascularly intact on exam.Obtained CBG and gave morning dose of Lantus. Plain film shows chronic changes without any acute fractures. The patient's pain is mostly concentrated over the left great toe particularly at MTP joint and IP joint. Based on her exam and her extensive hx of gout currently on allopurinol, I suspect gouty flare perhaps exacerbated by her trauma. She has bruising on exam and mild erythema but no erythema or induration extending onto the foot to suggest infection. I have extensively reviewed return precautions with her and instructed her to see her PCP in 2-3 days to recheck her symptoms. I reviewed her chart and prednisone and appears to be a poor choice because of her diabetes; I also feel that NSAIDs are relatively contraindicated because of her recent kidney function. I have recommended short course of narcotic pain medication and cautioned her not to use her tramadol at the same time. She has voiced understanding of treatment plan as well as return precautions and patient discharged in satisfactory condition.  Final Clinical Impressions(s) / ED Diagnoses   Final diagnoses:  Great toe pain, left    New Prescriptions New Prescriptions   HYDROCODONE-ACETAMINOPHEN (NORCO/VICODIN) 5-325 MG TABLET    Take 1-2 tablets by mouth every 6 (six) hours as needed for severe pain.  Yago Ludvigsen, Wenda Overland, MD 01/24/17 1356

## 2017-01-24 NOTE — ED Notes (Signed)
ED Provider at bedside. 

## 2017-01-24 NOTE — ED Notes (Signed)
Pt has not had her bp medications today. Pt states that she will take them when she gets home.

## 2017-01-24 NOTE — Discharge Instructions (Signed)
KEEP YOUR FOOT ELEVATED AS MUCH AS POSSIBLE. IF YOU TAKE HYDROCODONE FOR PAIN, DO NOT TAKE YOUR TRAMADOL. FOLLOW UP WITH YOUR PRIMARY DOCTOR IN 2-3 DAYS SO THEY CAN RECHECK YOUR FOOT. IF YOUR SYMPTOMS WORSEN, RETURN IMMEDIATELY TO THE ER.

## 2017-02-17 ENCOUNTER — Ambulatory Visit (INDEPENDENT_AMBULATORY_CARE_PROVIDER_SITE_OTHER): Payer: Medicare Other | Admitting: Cardiovascular Disease

## 2017-02-17 ENCOUNTER — Encounter: Payer: Self-pay | Admitting: Cardiovascular Disease

## 2017-02-17 VITALS — BP 148/95 | HR 89 | Ht 60.0 in | Wt 138.2 lb

## 2017-02-17 DIAGNOSIS — I10 Essential (primary) hypertension: Secondary | ICD-10-CM

## 2017-02-17 DIAGNOSIS — E7800 Pure hypercholesterolemia, unspecified: Secondary | ICD-10-CM

## 2017-02-17 DIAGNOSIS — I739 Peripheral vascular disease, unspecified: Secondary | ICD-10-CM | POA: Diagnosis not present

## 2017-02-17 NOTE — Assessment & Plan Note (Signed)
History of hyperlipidemia on statin therapy followed by her PCP. 

## 2017-02-17 NOTE — Assessment & Plan Note (Signed)
History of peripheral arterial disease status post bilateral iliac artery stenting back in 2009 with a known short segment occlusion of mid right SFA. Her Dopplers performed 10/23/15 revealed patent iliac stents with a right ABI of 1.33 and a left of .82.

## 2017-02-17 NOTE — Progress Notes (Signed)
02/17/2017 [name]   1946/08/23  [id number]  Primary Physician Nolene Ebbs, MD Primary Cardiologist: [name] MD Lupe Carney, [place]  HPI:  HPI:  The patient is a 71 year-old mildly overweight single [place] American female with no children, who I last saw 02/16/16. She has a history of remote alcohol and tobacco abuse, having quit 6 to 7 years ago. She smoked 50 pack years prior to that and does have COPD. She had PVOD, status post bilateral iliac artery PTA and stenting back in 2009 with a known short-segment occlusion from mid right SFA. She does get some calf claudication, but denies chest pain or shortness of breath. She does have chronic renal insufficiency and chronic pancreatitis as well as alcoholic hepatitis and type 2 diabetes. Her last Myoview, performed March 18, 2011, was not ischemic.Her most recent Dopplers performed 10/23/15 revealed a right ABI of 1.33 and a left ABI 0.82. Her iliac stents appeared patent. She denies chest pain or shortness of breath. She does have left great toe pain.  Current Outpatient Prescriptions  Medication Sig Dispense Refill  . albuterol (PROVENTIL HFA;VENTOLIN HFA) 108 (90 BASE) MCG/ACT inhaler Inhale 2 puffs into the lungs every 6 (six) hours as needed. For shortness of breath    . albuterol (PROVENTIL) (2.5 MG/3ML) 0.083% nebulizer solution Take 2.5 mg by nebulization every 6 (six) hours as needed for wheezing or shortness of breath.    . allopurinol (ZYLOPRIM) 100 MG tablet Take 100 mg by mouth 2 (two) times daily.    Marland Kitchen aspirin 81 MG EC tablet Take 81 mg by mouth daily.      . carvedilol (COREG) 12.5 MG tablet Take 12.5 mg by mouth 2 (two) times daily with a meal.    . cilostazol (PLETAL) 50 MG tablet Take 50 mg by mouth daily.     . cloNIDine (CATAPRES) 0.1 MG tablet Take 1 tablet (0.1 mg total) by mouth 3 (three) times daily. 90 tablet 11  . ergocalciferol (VITAMIN D2) 50000 UNITS capsule Take 50,000 Units by mouth every  Monday. On Monday    . ferrous sulfate 325 (65 FE) MG tablet Take 325 mg by mouth daily with breakfast.    . fluticasone (FLONASE) 50 MCG/ACT nasal spray Place 2 sprays into the nose daily as needed for allergies.     . folic acid (FOLVITE) 1 MG tablet Take 1 tablet (1 mg total) by mouth daily. 30 tablet 0  . furosemide (LASIX) 20 MG tablet Take 1 tablet (20 mg total) by mouth daily. 30 tablet 0  . HYDROcodone-acetaminophen (NORCO/VICODIN) 5-325 MG tablet Take 1-2 tablets by mouth every 6 (six) hours as needed for severe pain. 10 tablet 0  . LANTUS SOLOSTAR 100 UNIT/ML Solostar Pen Inject 5 Units as directed at bedtime.     Marland Kitchen lisinopril (PRINIVIL,ZESTRIL) 5 MG tablet Take 5 mg by mouth daily.    . magnesium oxide (MAG-OX) 400 MG tablet Take 400 mg by mouth daily.    . methocarbamol (ROBAXIN) 500 MG tablet Take 1 tablet (500 mg total) by mouth every 8 (eight) hours as needed for muscle spasms. 15 tablet 0  . metoprolol (LOPRESSOR) 50 MG tablet Take 50 mg by mouth daily.    . montelukast (SINGULAIR) 10 MG tablet Take 10 mg by mouth at bedtime.     Marland Kitchen omeprazole (PRILOSEC) 40 MG capsule Take 40 mg by mouth daily.     . OXYGEN-HELIUM IN Inhale 2 L into the  lungs at bedtime. 2 liters nightly    . simvastatin (ZOCOR) 10 MG tablet Take 10 mg by mouth daily.     . sitaGLIPtin (JANUVIA) 50 MG tablet Take 1 tablet by mouth daily.    Marland Kitchen spironolactone (ALDACTONE) 25 MG tablet Take 0.5 tablets (12.5 mg total) by mouth daily. 30 tablet 0  . thiamine 100 MG tablet Take 1 tablet (100 mg total) by mouth daily. 30 tablet 0  . tiZANidine (ZANAFLEX) 2 MG tablet Take 2 mg by mouth 2 (two) times daily as needed.  0  . traMADol (ULTRAM) 50 MG tablet Take 1 tablet (50 mg total) by mouth every 6 (six) hours as needed. (Patient taking differently: Take 50 mg by mouth every 6 (six) hours as needed for moderate pain. ) 15 tablet 0   No current facility-administered medications for this visit.     Allergies  Allergen  Reactions  . Compazine [Prochlorperazine Edisylate] Other (See Comments)    Altered mental status  . Penicillins Hives    Has patient had a PCN reaction causing immediate rash, facial/tongue/throat swelling, SOB or lightheadedness with hypotension: No Has patient had a PCN reaction causing severe rash involving mucus membranes or skin necrosis: No Has patient had a PCN reaction that required hospitalization No Has patient had a PCN reaction occurring within the last 10 years: No If all of the above answers are "NO", then may proceed with Cephalosporin use.   Marland Kitchen Phenergan [Promethazine Hcl] Other (See Comments)    Altered mental status  . Lipitor [Atorvastatin] Swelling    Social History   Social History  . Marital status: Single    Spouse name: N/A  . Number of children: N/A  . Years of education: N/A   Occupational History  . Not on file.   Social History Main Topics  . Smoking status: Current Some Day Smoker    Packs/day: 0.50    Years: 30.00    Types: Cigarettes    Last attempt to quit: 11/06/2005  . Smokeless tobacco: Never Used  . Alcohol use No     Comment: stopped 2005  . Drug use: No  . Sexual activity: Not on file   Other Topics Concern  . Not on file   Social History Narrative  . No narrative on file     Review of Systems: General: negative for chills, fever, night sweats or weight changes.  Cardiovascular: negative for chest pain, dyspnea on exertion, edema, orthopnea, palpitations, paroxysmal nocturnal dyspnea or shortness of breath Dermatological: negative for rash Respiratory: negative for cough or wheezing Urologic: negative for hematuria Abdominal: negative for nausea, vomiting, diarrhea, bright red blood per rectum, melena, or hematemesis Neurologic: negative for visual changes, syncope, or dizziness All other systems reviewed and are otherwise negative except as noted above.    Blood pressure (!) 148/95, pulse 89, height 5' (1.524 m), weight  138 lb 3.2 oz (62.7 kg).  General appearance: alert and no distress Neck: no adenopathy, no carotid bruit, no JVD, supple, symmetrical, trachea midline and thyroid not enlarged, symmetric, no tenderness/mass/nodules Lungs: clear to auscultation bilaterally Heart: regular rate and rhythm, S1, S2 normal, no murmur, click, rub or gallop Extremities: extremities normal, atraumatic, no cyanosis or edema  EKG Sinus rhythm 85 without ST or T-wave changes. I personally reviewed this EKG  ASSESSMENT AND PLAN:   PVD, LEIA/LIIA and RCIA pta 7/09- ABIs 0.68 and 0.69 Feb 2014 History of peripheral arterial disease status post bilateral iliac artery stenting back in  2009 with a known short segment occlusion of mid right SFA. Her Dopplers performed 10/23/15 revealed patent iliac stents with a right ABI of 1.33 and a left of .82.  Essential hypertension History of essential hypertension blood pressure measured 148/95. She is on carvedilol, clonidine, lisinopril, metoprolol and Spironolactone Continue current meds at current dosing  Hyperlipidemia History of hyperlipidemia on statin therapy followed by her PCP      [name] MD [facility], [facility] 02/17/2017 2:47 PM

## 2017-02-17 NOTE — Assessment & Plan Note (Signed)
History of essential hypertension blood pressure measured 148/95. She is on carvedilol, clonidine, lisinopril, metoprolol and Spironolactone Continue current meds at current dosing

## 2017-02-17 NOTE — Patient Instructions (Signed)

## 2017-04-12 ENCOUNTER — Encounter: Payer: Medicare Other | Admitting: Podiatry

## 2017-04-12 NOTE — Progress Notes (Signed)
     Gardiner Barefoot DPM  This encounter was created in error - please disregard.

## 2017-04-14 ENCOUNTER — Ambulatory Visit: Payer: Medicare Other | Admitting: Podiatry

## 2017-05-09 ENCOUNTER — Ambulatory Visit (INDEPENDENT_AMBULATORY_CARE_PROVIDER_SITE_OTHER): Payer: Medicare Other | Admitting: Podiatry

## 2017-05-09 DIAGNOSIS — E1159 Type 2 diabetes mellitus with other circulatory complications: Secondary | ICD-10-CM

## 2017-05-09 DIAGNOSIS — M79676 Pain in unspecified toe(s): Secondary | ICD-10-CM | POA: Diagnosis not present

## 2017-05-09 DIAGNOSIS — B351 Tinea unguium: Secondary | ICD-10-CM

## 2017-05-09 DIAGNOSIS — Q828 Other specified congenital malformations of skin: Secondary | ICD-10-CM | POA: Diagnosis not present

## 2017-05-09 NOTE — Progress Notes (Signed)
Complaint:  Visit Type: Patient returns to my office for continued preventative foot care services. Complaint: Patient states" my nails have grown long and thick and become painful to walk and wear shoes" Patient has been diagnosed with DM with angiopathy. The patient presents for preventative foot care services. No changes to ROS  Podiatric Exam: Vascular: dorsalis pedis and posterior tibial pulses are not  palpable bilateral. Capillary return is immediate. Temperature gradient is diminished. Sensorium: Normal Semmes Weinstein monofilament test. Normal tactile sensation bilaterally. Nail Exam: Pt has thick disfigured discolored nails with subungual debris noted bilateral entire nail hallux through fifth toenails Ulcer Exam: There is no evidence of ulcer or pre-ulcerative changes or infection. Orthopedic Exam: Muscle tone and strength are WNL. No limitations in general ROM. No crepitus or effusions noted. Foot type and digits show no abnormalities. Pes planus  HAV  B/L. Skin:  Porokeratosis right  hallux. No infection or ulcers  Diagnosis:  Onychomycosis, , Pain in right toe, pain in left toes  Porokeratosis right hallux.  Treatment & Plan Procedures and Treatment: Consent by patient was obtained for treatment procedures. The patient understood the discussion of treatment and procedures well. All questions were answered thoroughly reviewed. Debridement of mycotic and hypertrophic toenails, 1 through 5 bilateral and clearing of subungual debris. No ulceration, no infection noted.  Return Visit-Office Procedure: Patient instructed to return to the office for a follow up visit 3 months for continued evaluation and treatment.    Gardiner Barefoot DPM

## 2017-07-26 ENCOUNTER — Encounter (HOSPITAL_COMMUNITY): Payer: Self-pay | Admitting: Emergency Medicine

## 2017-07-26 ENCOUNTER — Emergency Department (HOSPITAL_COMMUNITY): Payer: Medicare Other

## 2017-07-26 ENCOUNTER — Other Ambulatory Visit: Payer: Self-pay

## 2017-07-26 ENCOUNTER — Emergency Department (HOSPITAL_COMMUNITY)
Admission: EM | Admit: 2017-07-26 | Discharge: 2017-07-26 | Disposition: A | Discharge status: Home / Self Care | Payer: Medicare Other | Attending: Emergency Medicine | Admitting: Emergency Medicine

## 2017-07-26 DIAGNOSIS — Y999 Unspecified external cause status: Secondary | ICD-10-CM | POA: Diagnosis not present

## 2017-07-26 DIAGNOSIS — J449 Chronic obstructive pulmonary disease, unspecified: Secondary | ICD-10-CM | POA: Insufficient documentation

## 2017-07-26 DIAGNOSIS — J45909 Unspecified asthma, uncomplicated: Secondary | ICD-10-CM | POA: Diagnosis not present

## 2017-07-26 DIAGNOSIS — W010XXA Fall on same level from slipping, tripping and stumbling without subsequent striking against object, initial encounter: Secondary | ICD-10-CM | POA: Diagnosis not present

## 2017-07-26 DIAGNOSIS — Z79899 Other long term (current) drug therapy: Secondary | ICD-10-CM | POA: Diagnosis not present

## 2017-07-26 DIAGNOSIS — Y939 Activity, unspecified: Secondary | ICD-10-CM | POA: Diagnosis not present

## 2017-07-26 DIAGNOSIS — N183 Chronic kidney disease, stage 3 (moderate): Secondary | ICD-10-CM | POA: Diagnosis not present

## 2017-07-26 DIAGNOSIS — S2232XA Fracture of one rib, left side, initial encounter for closed fracture: Secondary | ICD-10-CM | POA: Insufficient documentation

## 2017-07-26 DIAGNOSIS — E1122 Type 2 diabetes mellitus with diabetic chronic kidney disease: Secondary | ICD-10-CM | POA: Insufficient documentation

## 2017-07-26 DIAGNOSIS — I5032 Chronic diastolic (congestive) heart failure: Secondary | ICD-10-CM | POA: Diagnosis not present

## 2017-07-26 DIAGNOSIS — S299XXA Unspecified injury of thorax, initial encounter: Secondary | ICD-10-CM | POA: Diagnosis present

## 2017-07-26 DIAGNOSIS — I130 Hypertensive heart and chronic kidney disease with heart failure and stage 1 through stage 4 chronic kidney disease, or unspecified chronic kidney disease: Secondary | ICD-10-CM | POA: Diagnosis not present

## 2017-07-26 DIAGNOSIS — F17210 Nicotine dependence, cigarettes, uncomplicated: Secondary | ICD-10-CM | POA: Diagnosis not present

## 2017-07-26 DIAGNOSIS — S20212A Contusion of left front wall of thorax, initial encounter: Secondary | ICD-10-CM

## 2017-07-26 DIAGNOSIS — Z7982 Long term (current) use of aspirin: Secondary | ICD-10-CM | POA: Diagnosis not present

## 2017-07-26 DIAGNOSIS — Y929 Unspecified place or not applicable: Secondary | ICD-10-CM | POA: Diagnosis not present

## 2017-07-26 MED ORDER — TRAMADOL HCL 50 MG PO TABS
50.0000 mg | ORAL_TABLET | Freq: Once | ORAL | Status: AC
  Administered 2017-07-26: 50 mg via ORAL
  Filled 2017-07-26: qty 1

## 2017-07-26 MED ORDER — TRAMADOL HCL 50 MG PO TABS: 50.0000 mg | ORAL_TABLET | Freq: Three times a day (TID) | ORAL | 0 refills | Status: DC | PRN

## 2017-07-26 NOTE — ED Notes (Signed)
Patient reports hypertension history. Patient will take bp meds after discharge.

## 2017-07-26 NOTE — ED Notes (Signed)
Patient transported to X-ray 

## 2017-07-26 NOTE — ED Provider Notes (Signed)
[facility] EMERGENCY DEPARTMENT Provider Note   CSN: [id number] Arrival date & time: 07/26/17  0825     History   Chief Complaint Chief Complaint  Patient presents with  . Fall  . Chest Injury    HPI [name] is a 71 y.o. female.  Patient c/o left sided chest/rib pain s/p near fall yesterday evening. Patient indicates foot got stuck/lost balance, and fell against her walker with her left chest. Pain is constant, dull, moderate, worse w movement and palpation. No radiation of pain. Denies fall to ground. No faintness or dizziness. Denies neck or back pain. No sob. No abd pain or nv.    The history is provided by the patient.  Fall  Associated symptoms include chest pain. Pertinent negatives include no abdominal pain, no headaches and no shortness of breath.  Back Pain   Associated symptoms include chest pain. Pertinent negatives include no fever, no numbness, no headaches, no abdominal pain and no weakness.    Past Medical History:  Diagnosis Date  . Acute venous embolism and thrombosis of deep vessels of proximal lower extremity ([place])   . Alcoholic cirrhosis ([place])   . Anemia   . Anxiety   . Arthritis   . Asthma   . Back pain   . Blood transfusion few years ago  . Cellulitis    bilateral lower extremities  . CHF (congestive heart failure) ([place]) 10/13   grade 1 diastolic dysfunction, Nl LVF  . Chronic diastolic congestive heart failure ([place])   . Chronic obstructive pulmonary disease (COPD) ([place])   . Colon polyps   . Depression   . Diabetes mellitus    insulin dep   . Gastroesophageal reflux   . Glaucoma    left  . Gout   . Hepatitis    alcoholic hepatitis  . History of GI diverticular bleed   . Hypertension   . Leg swelling   . On home oxygen therapy oxygen 2 liter per minute prn per [place]  . Pneumonia    history of  . PVD (peripheral vascular disease) ([place]) 2009   bilat iliac stenting  . Reflux esophagitis   . Villous adenoma of  colon 05/10/11    Patient Active Problem List   Diagnosis Date Noted  . Gout 08/10/2015  . Hypoglycemia   . Hypothermia   . UTI (lower urinary tract infection)   . Humeral head fracture 06/05/2015  . Idiopathic chronic gout of multiple sites without tophus 06/05/2015  . Chronic renal disease, stage III ([place]) 06/05/2015  . Nausea vomiting and diarrhea 05/09/2015  . Increased urinary frequency 05/09/2015  . Sepsis ([place]) 05/09/2015  . Hypokalemia   . Chronic diastolic congestive heart failure ([place])   . Hyperlipidemia 02/10/2015  . Cellulitis 11/04/2014  . Essential hypertension 08/27/2014  . Abdominal pain, acute, bilateral lower quadrant 07/26/2014  . Colitis 07/26/2014  . GI bleeding 06/14/2014  . GI bleed 06/14/2014  . Acute encephalopathy 06/14/2014  . Personal history of colonic polyps 08/06/2013  . Knee pain, acute 05/20/2013  . Claudication ([place]) 03/13/2013  . Obesity 03/13/2013  . PVD, LEIA/LIIA and RCIA pta 7/09- ABIs 0.68 and 0.69 Feb 2014 11/17/2012  . PUD (peptic ulcer disease) GI bleed in past (2011) 11/17/2012  . Cirrhosis of liver ([place]) 11/17/2012  . Colon cancer, lap chole 2012 11/17/2012  . 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 Atypical chest pain- no history of CAD. Myoview low risk 2012 11/15/2012  .  Controlled diabetes mellitus type 2 with complications ([place]) 00/86/7619  . Acute renal failure superimposed on stage 3 chronic kidney disease ([place]) 06/16/2012  . Incisional hernia 01/09/2012  . Anemia 01/28/2011  . COPD (chronic obstructive pulmonary disease) ([place]) 01/28/2011  . Sleep apnea 01/28/2011  . Transfusion history 01/28/2011  . Sinus problem 01/28/2011  . Full dentures 01/28/2011    Past Surgical History:  Procedure Laterality Date  . ABDOMINAL HYSTERECTOMY  yrs ago  . CATARACT EXTRACTION Right yrs ago  . COLONOSCOPY    . COLONOSCOPY WITH PROPOFOL N/A 08/06/2013   Procedure: COLONOSCOPY WITH PROPOFOL;  Surgeon: Lear Ng, MD;  Location: [facility];  Service: Endoscopy;  Laterality: N/A;  . ESOPHAGOGASTRODUODENOSCOPY (EGD) WITH PROPOFOL N/A 08/06/2013   Procedure: ESOPHAGOGASTRODUODENOSCOPY (EGD) WITH PROPOFOL;  Surgeon: Lear Ng, MD;  Location: [facility];  Service: Endoscopy;  Laterality: N/A;  . LAPAROSCOPIC ASSISSTED TOTAL COLECTOMY W/ J-POUCH  04/22/11  . VENTRAL HERNIA REPAIR  06/13/2012   Procedure: LAPAROSCOPIC VENTRAL HERNIA;  Surgeon: Adin Hector, MD;  Location: [facility];  Service: General;  Laterality: N/A;  Laparoscopic Assisted Ventral Hernia with Mesh    OB History    No data available       Home Medications    Prior to Admission medications   Medication Sig Start Date End Date Taking? Authorizing Provider  albuterol (PROVENTIL HFA;VENTOLIN HFA) 108 (90 BASE) MCG/ACT inhaler Inhale 2 puffs into the lungs every 6 (six) hours as needed. For shortness of breath    [provider]  albuterol (PROVENTIL) (2.5 MG/3ML) 0.083% nebulizer solution Take 2.5 mg by nebulization every 6 (six) hours as needed for wheezing or shortness of breath.    [provider]  allopurinol (ZYLOPRIM) 100 MG tablet Take 100 mg by mouth 2 (two) times daily. 01/18/16   [provider]  aspirin 81 MG EC tablet Take 81 mg by mouth daily.      [provider]  carvedilol (COREG) 12.5 MG tablet Take 12.5 mg by mouth 2 (two) times daily with a meal.    [provider]  cilostazol (PLETAL) 50 MG tablet Take 50 mg by mouth daily.  01/18/16   [provider]  cloNIDine (CATAPRES) 0.1 MG tablet Take 1 tablet (0.1 mg total) by mouth 3 (three) times daily. 09/05/14   [name], MD  ergocalciferol (VITAMIN D2) 50000 UNITS capsule Take 50,000 Units by mouth every Monday. On Monday    [provider]  ferrous sulfate 325 (65 FE) MG tablet Take 325 mg by mouth daily with breakfast.    [provider]  fluticasone (FLONASE) 50 MCG/ACT  nasal spray Place 2 sprays into the nose daily as needed for allergies.     [provider]  folic acid (FOLVITE) 1 MG tablet Take 1 tablet (1 mg total) by mouth daily. 08/18/15   Hongalgi, Lenis Dickinson, MD  furosemide (LASIX) 20 MG tablet Take 1 tablet (20 mg total) by mouth daily. 08/18/15   Hongalgi, Lenis Dickinson, MD  HYDROcodone-acetaminophen (NORCO/VICODIN) 5-325 MG tablet Take 1-2 tablets by mouth every 6 (six) hours as needed for severe pain. 01/24/17   Little, Wenda Overland, MD  LANTUS SOLOSTAR 100 UNIT/ML Solostar Pen Inject 5 Units as directed at bedtime.  01/18/16   [provider]  lisinopril (PRINIVIL,ZESTRIL) 5 MG tablet Take 5 mg by mouth daily. 01/18/16   [provider]  magnesium oxide (MAG-OX) 400 MG tablet Take 400 mg by mouth daily.  [provider]  methocarbamol (ROBAXIN) 500 MG tablet Take 1 tablet (500 mg total) by mouth every 8 (eight) hours as needed for muscle spasms. 02/17/16   Mesner, Corene Cornea, MD  metoprolol (LOPRESSOR) 50 MG tablet Take 50 mg by mouth daily. 01/18/16   [provider]  montelukast (SINGULAIR) 10 MG tablet Take 10 mg by mouth at bedtime.     [provider]  omeprazole (PRILOSEC) 40 MG capsule Take 40 mg by mouth daily.     [provider]  OXYGEN-HELIUM IN Inhale 2 L into the lungs at bedtime. 2 liters nightly    [provider]  simvastatin (ZOCOR) 10 MG tablet Take 10 mg by mouth daily.  11/30/14   [provider]  sitaGLIPtin (JANUVIA) 50 MG tablet Take 1 tablet by mouth daily. 03/27/14   [provider]  spironolactone (ALDACTONE) 25 MG tablet Take 0.5 tablets (12.5 mg total) by mouth daily. 08/18/15   Hongalgi, Lenis Dickinson, MD  thiamine 100 MG tablet Take 1 tablet (100 mg total) by mouth daily. 08/18/15   Hongalgi, Lenis Dickinson, MD  tiZANidine (ZANAFLEX) 2 MG tablet Take 2 mg by mouth 2 (two) times daily as needed. 01/04/17   [provider]  traMADol (ULTRAM) 50 MG tablet Take  1 tablet (50 mg total) by mouth every 6 (six) hours as needed. Patient taking differently: Take 50 mg by mouth every 6 (six) hours as needed for moderate pain.  06/02/15   Carmin Muskrat, MD    Family History Family History  Problem Relation Age of Onset  . Cancer Brother        heent ca  . Cancer Sister        liver ca    Social History Social History   Tobacco Use  . Smoking status: Current Some Day Smoker    Packs/day: 0.50    Years: 30.00    Pack years: 15.00    Types: Cigarettes    Last attempt to quit: 11/06/2005    Years since quitting: 11.7  . Smokeless tobacco: Never Used  Substance Use Topics  . Alcohol use: No    Comment: stopped 2005  . Drug use: No     Allergies   Compazine [prochlorperazine edisylate]; Penicillins; Phenergan [promethazine hcl]; and Lipitor [atorvastatin]   Review of Systems Review of Systems  Constitutional: Negative for fever.  HENT: Negative for sore throat.   Eyes: Negative for pain.  Respiratory: Negative for cough and shortness of breath.   Cardiovascular: Positive for chest pain.  Gastrointestinal: Negative for abdominal pain and vomiting.  Genitourinary: Negative for flank pain.  Musculoskeletal: Negative for neck pain.  Skin: Negative for wound.  Neurological: Negative for weakness, numbness and headaches.  Hematological: Does not bruise/bleed easily.  Psychiatric/Behavioral: Negative for confusion.     Physical Exam Updated Vital Signs BP (!) 174/86   Pulse 89   Temp 98.8 F (37.1 C) (Oral)   Resp 16   Ht 1.524 m (5')   Wt 62.6 kg (138 lb)   SpO2 96%   BMI 26.95 kg/m   Physical Exam  Constitutional: She appears well-developed and well-nourished. No distress.  HENT:  Head: Atraumatic.  Eyes: Conjunctivae are normal. Pupils are equal, round, and reactive to light. No scleral icterus.  Neck: Normal range of motion. Neck supple. No tracheal deviation present.  Cardiovascular: Normal rate, regular rhythm,  normal heart sounds and intact distal pulses.  Pulmonary/Chest: Effort normal and breath sounds normal. No respiratory distress.  She exhibits tenderness.  Left lower and lateral rib tenderness. No crepitus. Normal chest wall movement.   Abdominal: Soft. Normal appearance and bowel sounds are normal. She exhibits no distension. There is no tenderness. There is no guarding.  No abd bruising or contusion.   Genitourinary:  Genitourinary Comments: No cva tenderness  Musculoskeletal: She exhibits no edema.  CTLS spine, non tender, aligned, no step off. Good rom bil ext without pain or focal bony tenderness.  Neurological: She is alert.  Speech clear/fluent. Motor intact bil, stre 5/5.   Skin: Skin is warm and dry. No rash noted. She is not diaphoretic.  Psychiatric: She has a normal mood and affect.  Nursing note and vitals reviewed.    ED Treatments / Results  Labs (all labs ordered are listed, but only abnormal results are displayed) Labs Reviewed - No data to display  EKG  EKG Interpretation None       Radiology Dg Ribs Unilateral W/chest Left  Result Date: 07/26/2017 CLINICAL DATA:  71 year old female status post fall yesterday. Left lower rib pain. EXAM: LEFT RIBS AND CHEST - 3+ VIEW COMPARISON:  Portable chest 08/15/2015 and earlier. FINDINGS: Upright PA view. Lower lung volumes. Small pleural effusions suspected. No pneumothorax. Stable cardiac size and mediastinal contours. Stable increased interstitial markings. No confluent pulmonary opacity. Osteopenia. Degenerative changes at both shoulders. Mildly displaced left lateral ninth rib fracture. Chronic appearing anterior left eighth and ninth rib fractures. No other No acute displaced rib fracture identified. Negative visible bowel gas pattern. IMPRESSION: 1. Mildly displaced left lateral ninth rib fracture. 2. Small bilateral pleural effusions suspected and may be chronic. No other acute cardiopulmonary abnormality.  Electronically Signed   By: Genevie Ann M.D.   On: 07/26/2017 09:24    Procedures Procedures (including critical care time)  Medications Ordered in ED Medications - No data to display   Initial Impression / Assessment and Plan / ED Course  I have reviewed the triage vital signs and the nursing notes.  Pertinent labs & imaging results that were available during my care of the patient were reviewed by me and considered in my medical decision making (see chart for details).  Imaging study ordered.  Ultram for pain.  Reviewed nursing notes and prior charts for additional history.   Recheck pt comfortable appearing, breathing comfortably.    Incentive spirometer for home.  Discussed xrays/plan with patient.   Patient currently appears stable for d/c.     Final Clinical Impressions(s) / ED Diagnoses   Final diagnoses:  None    ED Discharge Orders    None       Lajean Saver, MD 07/26/17 [phone]

## 2017-07-26 NOTE — ED Triage Notes (Addendum)
Pt arrives from home via [facility] reporting fall yesterday evening, reports falling against L side on walker, denies LOC, head trauma, no blood thineers.  Pt c/o L mid and lower lateral back pain.  Pt denies CP, SOB, dizziness preceding fall, reports frequent falls d/t loss of balance. Pt AOx4 at this time, grunting in pain.

## 2017-07-26 NOTE — Discharge Instructions (Signed)
It was our pleasure to provide your ER care today - we hope that you feel better.  Take motrin or aleve as need for pain.  You may also take ultram as need for pain - no driving when taking.   Use incentive spirometer - 10 full and complete breaths in, every hour while awake.   Return to ER if worse, new symptoms, trouble breathing, other concern.

## 2017-07-26 NOTE — ED Notes (Signed)
Family member called to pick patient up

## 2017-08-08 ENCOUNTER — Ambulatory Visit: Payer: Medicare Other | Admitting: Podiatry

## 2017-09-18 ENCOUNTER — Ambulatory Visit (INDEPENDENT_AMBULATORY_CARE_PROVIDER_SITE_OTHER): Payer: Medicare Other | Admitting: Podiatry

## 2017-09-18 ENCOUNTER — Encounter: Payer: Self-pay | Admitting: Podiatry

## 2017-09-18 DIAGNOSIS — B351 Tinea unguium: Secondary | ICD-10-CM

## 2017-09-18 DIAGNOSIS — M79676 Pain in unspecified toe(s): Secondary | ICD-10-CM | POA: Diagnosis not present

## 2017-09-18 DIAGNOSIS — Q828 Other specified congenital malformations of skin: Secondary | ICD-10-CM

## 2017-09-18 DIAGNOSIS — E1159 Type 2 diabetes mellitus with other circulatory complications: Secondary | ICD-10-CM

## 2017-09-18 NOTE — Progress Notes (Signed)
Complaint:  Visit Type: Patient returns to my office for continued preventative foot care services. Complaint: Patient states" my nails have grown long and thick and become painful to walk and wear shoes" Patient has been diagnosed with DM with angiopathy. The patient presents for preventative foot care services. No changes to ROS  Podiatric Exam: Vascular: dorsalis pedis and posterior tibial pulses are not  palpable bilateral. Capillary return is immediate. Temperature gradient is diminished. Sensorium: Normal Semmes Weinstein monofilament test. Normal tactile sensation bilaterally. Nail Exam: Pt has thick disfigured discolored nails with subungual debris noted bilateral entire nail hallux through fifth toenails Ulcer Exam: There is no evidence of ulcer or pre-ulcerative changes or infection. Orthopedic Exam: Muscle tone and strength are WNL. No limitations in general ROM. No crepitus or effusions noted. Foot type and digits show no abnormalities. Pes planus  HAV  B/L. Skin:  Porokeratosis right  hallux. No infection or ulcers  Diagnosis:  Onychomycosis, , Pain in right toe, pain in left toes  Porokeratosis right hallux.  Treatment & Plan Procedures and Treatment: Consent by patient was obtained for treatment procedures. The patient understood the discussion of treatment and procedures well. All questions were answered thoroughly reviewed. Debridement of mycotic and hypertrophic toenails, 1 through 5 bilateral and clearing of subungual debris. No ulceration, no infection noted.  Return Visit-Office Procedure: Patient instructed to return to the office for a follow up visit 3 months for continued evaluation and treatment.    Gardiner Barefoot DPM

## 2017-11-10 ENCOUNTER — Other Ambulatory Visit: Payer: Self-pay | Admitting: Internal Medicine

## 2017-11-10 DIAGNOSIS — Z1231 Encounter for screening mammogram for malignant neoplasm of breast: Secondary | ICD-10-CM

## 2017-11-30 ENCOUNTER — Emergency Department (HOSPITAL_COMMUNITY): Payer: Medicare Other

## 2017-11-30 ENCOUNTER — Emergency Department (HOSPITAL_COMMUNITY)
Admission: EM | Admit: 2017-11-30 | Discharge: 2017-12-01 | Disposition: A | Discharge status: Home / Self Care | Payer: Medicare Other | Attending: Emergency Medicine | Admitting: Emergency Medicine

## 2017-11-30 ENCOUNTER — Encounter (HOSPITAL_COMMUNITY): Payer: Self-pay | Admitting: *Deleted

## 2017-11-30 DIAGNOSIS — F17210 Nicotine dependence, cigarettes, uncomplicated: Secondary | ICD-10-CM | POA: Diagnosis not present

## 2017-11-30 DIAGNOSIS — I130 Hypertensive heart and chronic kidney disease with heart failure and stage 1 through stage 4 chronic kidney disease, or unspecified chronic kidney disease: Secondary | ICD-10-CM | POA: Diagnosis not present

## 2017-11-30 DIAGNOSIS — Z85038 Personal history of other malignant neoplasm of large intestine: Secondary | ICD-10-CM | POA: Insufficient documentation

## 2017-11-30 DIAGNOSIS — Z794 Long term (current) use of insulin: Secondary | ICD-10-CM | POA: Insufficient documentation

## 2017-11-30 DIAGNOSIS — I5032 Chronic diastolic (congestive) heart failure: Secondary | ICD-10-CM | POA: Diagnosis not present

## 2017-11-30 DIAGNOSIS — N183 Chronic kidney disease, stage 3 (moderate): Secondary | ICD-10-CM | POA: Diagnosis not present

## 2017-11-30 DIAGNOSIS — Z79899 Other long term (current) drug therapy: Secondary | ICD-10-CM | POA: Diagnosis not present

## 2017-11-30 DIAGNOSIS — R0981 Nasal congestion: Secondary | ICD-10-CM | POA: Insufficient documentation

## 2017-11-30 DIAGNOSIS — R509 Fever, unspecified: Secondary | ICD-10-CM | POA: Insufficient documentation

## 2017-11-30 DIAGNOSIS — R197 Diarrhea, unspecified: Secondary | ICD-10-CM | POA: Diagnosis not present

## 2017-11-30 DIAGNOSIS — N3000 Acute cystitis without hematuria: Secondary | ICD-10-CM | POA: Diagnosis not present

## 2017-11-30 DIAGNOSIS — E1122 Type 2 diabetes mellitus with diabetic chronic kidney disease: Secondary | ICD-10-CM | POA: Diagnosis not present

## 2017-11-30 DIAGNOSIS — J45909 Unspecified asthma, uncomplicated: Secondary | ICD-10-CM | POA: Insufficient documentation

## 2017-11-30 DIAGNOSIS — Z7982 Long term (current) use of aspirin: Secondary | ICD-10-CM | POA: Insufficient documentation

## 2017-11-30 DIAGNOSIS — Z9981 Dependence on supplemental oxygen: Secondary | ICD-10-CM | POA: Diagnosis not present

## 2017-11-30 DIAGNOSIS — R05 Cough: Secondary | ICD-10-CM | POA: Diagnosis not present

## 2017-11-30 DIAGNOSIS — R112 Nausea with vomiting, unspecified: Secondary | ICD-10-CM | POA: Insufficient documentation

## 2017-11-30 DIAGNOSIS — R109 Unspecified abdominal pain: Secondary | ICD-10-CM | POA: Insufficient documentation

## 2017-11-30 LAB — URINALYSIS, ROUTINE W REFLEX MICROSCOPIC
Bilirubin Urine: NEGATIVE
Glucose, UA: NEGATIVE mg/dL
HGB URINE DIPSTICK: NEGATIVE
Ketones, ur: NEGATIVE mg/dL
NITRITE: NEGATIVE
SPECIFIC GRAVITY, URINE: 1.017 (ref 1.005–1.030)
pH: 5 (ref 5.0–8.0)

## 2017-11-30 LAB — COMPREHENSIVE METABOLIC PANEL
ALBUMIN: 2.7 g/dL — AB (ref 3.5–5.0)
ALT: 18 U/L (ref 14–54)
ANION GAP: 11 (ref 5–15)
AST: 38 U/L (ref 15–41)
Alkaline Phosphatase: 140 U/L — ABNORMAL HIGH (ref 38–126)
BILIRUBIN TOTAL: 0.5 mg/dL (ref 0.3–1.2)
BUN: 28 mg/dL — AB (ref 6–20)
CO2: 26 mmol/L (ref 22–32)
Calcium: 9 mg/dL (ref 8.9–10.3)
Chloride: 102 mmol/L (ref 101–111)
Creatinine, Ser: 1.62 mg/dL — ABNORMAL HIGH (ref 0.44–1.00)
GFR calc Af Amer: 35 mL/min — ABNORMAL LOW (ref >60)
GFR calc non Af Amer: 30 mL/min — ABNORMAL LOW (ref >60)
GLUCOSE: 189 mg/dL — AB (ref 65–99)
POTASSIUM: 4.5 mmol/L (ref 3.5–5.1)
SODIUM: 139 mmol/L (ref 135–145)
TOTAL PROTEIN: 6.6 g/dL (ref 6.5–8.1)

## 2017-11-30 LAB — CBC
HEMATOCRIT: 38.8 % (ref 36.0–46.0)
HEMOGLOBIN: 11.7 g/dL — AB (ref 12.0–15.0)
MCH: 26.4 pg (ref 26.0–34.0)
MCHC: 30.2 g/dL (ref 30.0–36.0)
MCV: 87.4 fL (ref 78.0–100.0)
Platelets: 307 10*3/uL (ref 150–400)
RBC: 4.44 MIL/uL (ref 3.87–5.11)
RDW: 14.9 % (ref 11.5–15.5)
WBC: 13.5 10*3/uL — ABNORMAL HIGH (ref 4.0–10.5)

## 2017-11-30 LAB — LIPASE, BLOOD: Lipase: 39 U/L (ref 11–51)

## 2017-11-30 LAB — I-STAT CG4 LACTIC ACID, ED
LACTIC ACID, VENOUS: 0.91 mmol/L (ref 0.5–1.9)
LACTIC ACID, VENOUS: 0.92 mmol/L (ref 0.5–1.9)

## 2017-11-30 MED ORDER — SODIUM CHLORIDE 0.9 % IV SOLN
1.0000 g | Freq: Once | INTRAVENOUS | Status: AC
  Administered 2017-11-30: 1 g via INTRAVENOUS
  Filled 2017-11-30: qty 10

## 2017-11-30 MED ORDER — IOPAMIDOL (ISOVUE-300) INJECTION 61%
100.0000 mL | Freq: Once | INTRAVENOUS | Status: AC | PRN
  Administered 2017-11-30: 100 mL via INTRAVENOUS

## 2017-11-30 MED ORDER — CEPHALEXIN 500 MG PO CAPS: 500.0000 mg | ORAL_CAPSULE | Freq: Two times a day (BID) | ORAL | 0 refills | Status: DC

## 2017-11-30 MED ORDER — IOPAMIDOL (ISOVUE-300) INJECTION 61%
INTRAVENOUS | Status: AC
  Filled 2017-11-30: qty 100

## 2017-11-30 MED ORDER — SODIUM CHLORIDE 0.9 % IV BOLUS
500.0000 mL | Freq: Once | INTRAVENOUS | Status: AC
  Administered 2017-11-30: 500 mL via INTRAVENOUS

## 2017-11-30 NOTE — ED Notes (Signed)
Pt given urine specimen cup.

## 2017-11-30 NOTE — Discharge Instructions (Addendum)
You have some abnormal nodules on your abdominal CT that need further evaluation by her primary care provider.  Your kidney function was slightly worse than your prior values.  This should also be rechecked by your primary care physician.

## 2017-11-30 NOTE — ED Triage Notes (Addendum)
Pt in c/o n/v/d and body aches that started last night, no distress noted, fever for EMS at 100.2 and pt was given 1000mg  tylenol

## 2017-11-30 NOTE — ED Provider Notes (Signed)
[facility] EMERGENCY DEPARTMENT Provider Note   CSN: [id number] Arrival date & time: 11/30/17  1042     History   Chief Complaint Chief Complaint  Patient presents with  . Emesis    HPI [name] is a 72 y.o. female.  Patient is a 72 year old female with a history of diabetes, alcoholic cirrhosis, hypertension, COPD, coronary artery disease, CHF, peripheral vascular disease who presents with vomiting and abdominal pain.  She states she started vomiting yesterday.  She describes nonbloody, nonbilious emesis.  She has a little bit of loose stools.  She has generalized abdominal soreness and distention.  She had a fever which she says is been a little over 100 yesterday and today.  EMS documented at 100.2 earlier today.  She has a little bit of cough and congestion.  She states this is consistent with her CHF.  She feels a little bit more short of breath than she normally does.  No increased leg swelling.  No urinary symptoms.  She does state that she has been out of her morning medications for about the last 3 days.     Past Medical History:  Diagnosis Date  . Acute venous embolism and thrombosis of deep vessels of proximal lower extremity ([place])   . Alcoholic cirrhosis ([place])   . Anemia   . Anxiety   . Arthritis   . Asthma   . Back pain   . Blood transfusion few years ago  . Cellulitis    bilateral lower extremities  . CHF (congestive heart failure) ([place]) 10/13   grade 1 diastolic dysfunction, Nl LVF  . Chronic diastolic congestive heart failure ([place])   . Chronic obstructive pulmonary disease (COPD) ([place])   . Colon polyps   . Depression   . Diabetes mellitus    insulin dep   . Gastroesophageal reflux   . Glaucoma    left  . Gout   . Hepatitis    alcoholic hepatitis  . History of GI diverticular bleed   . Hypertension   . Leg swelling   . On home oxygen therapy oxygen 2 liter per minute prn per [place]  . Pneumonia    history of  . PVD  (peripheral vascular disease) ([place]) 2009   bilat iliac stenting  . Reflux esophagitis   . Villous adenoma of colon 05/10/11    Patient Active Problem List   Diagnosis Date Noted  . Gout 08/10/2015  . Hypoglycemia   . Hypothermia   . UTI (lower urinary tract infection)   . Humeral head fracture 06/05/2015  . Idiopathic chronic gout of multiple sites without tophus 06/05/2015  . Chronic renal disease, stage III ([place]) 06/05/2015  . Nausea vomiting and diarrhea 05/09/2015  . Increased urinary frequency 05/09/2015  . Sepsis ([place]) 05/09/2015  . Hypokalemia   . Chronic diastolic congestive heart failure ([place])   . Hyperlipidemia 02/10/2015  . Cellulitis 11/04/2014  . Essential hypertension 08/27/2014  . Abdominal pain, acute, bilateral lower quadrant 07/26/2014  . Colitis 07/26/2014  . GI bleeding 06/14/2014  . GI bleed 06/14/2014  . Acute encephalopathy 06/14/2014  . Personal history of colonic polyps 08/06/2013  . Knee pain, acute 05/20/2013  . Claudication ([place]) 03/13/2013  . Obesity 03/13/2013  . PVD, LEIA/LIIA and RCIA pta 7/09- ABIs 0.68 and 0.69 Feb 2014 11/17/2012  . PUD (peptic ulcer disease) GI bleed in past (2011) 11/17/2012  . Cirrhosis of liver ([place]) 11/17/2012  . Colon cancer, lap chole 2012 11/17/2012  .  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 Atypical chest pain- no history of CAD. Myoview low risk 2012 11/15/2012  . Controlled diabetes mellitus type 2 with complications ([place]) 13/03/6577  . Acute renal failure superimposed on stage 3 chronic kidney disease ([place]) 06/16/2012  . Incisional hernia 01/09/2012  . Anemia 01/28/2011  . COPD (chronic obstructive pulmonary disease) ([place]) 01/28/2011  . Sleep apnea 01/28/2011  . Transfusion history 01/28/2011  . Sinus problem 01/28/2011  . Full dentures 01/28/2011    Past Surgical History:  Procedure Laterality Date  . ABDOMINAL HYSTERECTOMY  yrs ago  . CATARACT EXTRACTION Right yrs ago  .  COLONOSCOPY    . COLONOSCOPY WITH PROPOFOL N/A 08/06/2013   Procedure: COLONOSCOPY WITH PROPOFOL;  Surgeon: Lear Ng, MD;  Location: [facility];  Service: Endoscopy;  Laterality: N/A;  . ESOPHAGOGASTRODUODENOSCOPY (EGD) WITH PROPOFOL N/A 08/06/2013   Procedure: ESOPHAGOGASTRODUODENOSCOPY (EGD) WITH PROPOFOL;  Surgeon: Lear Ng, MD;  Location: [facility];  Service: Endoscopy;  Laterality: N/A;  . LAPAROSCOPIC ASSISSTED TOTAL COLECTOMY W/ J-POUCH  04/22/11  . VENTRAL HERNIA REPAIR  06/13/2012   Procedure: LAPAROSCOPIC VENTRAL HERNIA;  Surgeon: Adin Hector, MD;  Location: [facility];  Service: General;  Laterality: N/A;  Laparoscopic Assisted Ventral Hernia with Mesh     OB History   None      Home Medications    Prior to Admission medications   Medication Sig Start Date End Date Taking? Authorizing Provider  albuterol (PROVENTIL HFA;VENTOLIN HFA) 108 (90 BASE) MCG/ACT inhaler Inhale 2 puffs into the lungs every 6 (six) hours as needed. For shortness of breath    [provider]  albuterol (PROVENTIL) (2.5 MG/3ML) 0.083% nebulizer solution Take 2.5 mg by nebulization every 6 (six) hours as needed for wheezing or shortness of breath.    [provider]  allopurinol (ZYLOPRIM) 100 MG tablet Take 100 mg by mouth 2 (two) times daily. 01/18/16   [provider]  aspirin 81 MG EC tablet Take 81 mg by mouth daily.      [provider]  carvedilol (COREG) 12.5 MG tablet Take 12.5 mg by mouth 2 (two) times daily with a meal.    [provider]  cephALEXin (KEFLEX) 500 MG capsule Take 1 capsule (500 mg total) by mouth 2 (two) times daily. 11/30/17   Malvin Johns, MD  cilostazol (PLETAL) 50 MG tablet Take 50 mg by mouth daily.  01/18/16   [provider]  cloNIDine (CATAPRES) 0.1 MG tablet Take 1 tablet (0.1 mg total) by mouth 3 (three) times daily. 09/05/14   [name], MD  ergocalciferol (VITAMIN D2) 50000 UNITS capsule  Take 50,000 Units by mouth every Monday. On Monday    [provider]  ferrous sulfate 325 (65 FE) MG tablet Take 325 mg by mouth daily with breakfast.    [provider]  fluticasone (FLONASE) 50 MCG/ACT nasal spray Place 2 sprays into the nose daily as needed for allergies.     [provider]  folic acid (FOLVITE) 1 MG tablet Take 1 tablet (1 mg total) by mouth daily. 08/18/15   Hongalgi, Lenis Dickinson, MD  furosemide (LASIX) 20 MG tablet Take 1 tablet (20 mg total) by mouth daily. 08/18/15   Hongalgi, Lenis Dickinson, MD  HYDROcodone-acetaminophen (NORCO/VICODIN) 5-325 MG tablet Take 1-2 tablets by mouth every 6 (six) hours as needed for severe pain. 01/24/17   Little, Wenda Overland, MD  LANTUS SOLOSTAR 100 UNIT/ML Solostar Pen  Inject 5 Units as directed at bedtime.  01/18/16   [provider]  lisinopril (PRINIVIL,ZESTRIL) 5 MG tablet Take 5 mg by mouth daily. 01/18/16   [provider]  magnesium oxide (MAG-OX) 400 MG tablet Take 400 mg by mouth daily.    [provider]  methocarbamol (ROBAXIN) 500 MG tablet Take 1 tablet (500 mg total) by mouth every 8 (eight) hours as needed for muscle spasms. 02/17/16   Mesner, Corene Cornea, MD  metoprolol (LOPRESSOR) 50 MG tablet Take 50 mg by mouth daily. 01/18/16   [provider]  montelukast (SINGULAIR) 10 MG tablet Take 10 mg by mouth at bedtime.     [provider]  omeprazole (PRILOSEC) 40 MG capsule Take 40 mg by mouth daily.     [provider]  OXYGEN-HELIUM IN Inhale 2 L into the lungs at bedtime. 2 liters nightly    [provider]  simvastatin (ZOCOR) 10 MG tablet Take 10 mg by mouth daily.  11/30/14   [provider]  sitaGLIPtin (JANUVIA) 50 MG tablet Take 1 tablet by mouth daily. 03/27/14   [provider]  spironolactone (ALDACTONE) 25 MG tablet Take 0.5 tablets (12.5 mg total) by mouth daily. 08/18/15   Hongalgi, Lenis Dickinson, MD  thiamine 100 MG tablet Take 1  tablet (100 mg total) by mouth daily. 08/18/15   Hongalgi, Lenis Dickinson, MD  tiZANidine (ZANAFLEX) 2 MG tablet Take 2 mg by mouth 2 (two) times daily as needed. 01/04/17   [provider]  traMADol (ULTRAM) 50 MG tablet Take 1 tablet (50 mg total) by mouth every 6 (six) hours as needed. Patient taking differently: Take 50 mg by mouth every 6 (six) hours as needed for moderate pain.  06/02/15   Carmin Muskrat, MD  traMADol (ULTRAM) 50 MG tablet Take 1 tablet (50 mg total) by mouth 3 (three) times daily as needed. 07/26/17   Lajean Saver, MD  insulin aspart (NOVOLOG) 100 UNIT/ML injection Before each meal 3 times a day, 140-199 - 2 units, 200-250 - 4 units, 251-299 - 6 units,  300-349 - 8 units,  350 or above 10 units. Insulin PEN if approved, provide syringes and needles if needed. Patient not taking: Reported on 05/09/2015 07/30/14 06/02/15  Thurnell Lose, MD    Family History Family History  Problem Relation Age of Onset  . Cancer Brother        heent ca  . Cancer Sister        liver ca    Social History Social History   Tobacco Use  . Smoking status: Current Some Day Smoker    Packs/day: 0.50    Years: 30.00    Pack years: 15.00    Types: Cigarettes    Last attempt to quit: 11/06/2005    Years since quitting: 12.0  . Smokeless tobacco: Never Used  Substance Use Topics  . Alcohol use: No    Comment: stopped 2005  . Drug use: No     Allergies   Compazine [prochlorperazine edisylate]; Penicillins; Phenergan [promethazine hcl]; and Lipitor [atorvastatin]   Review of Systems Review of Systems  Constitutional: Positive for chills, fatigue and fever. Negative for diaphoresis.  HENT: Negative for congestion, rhinorrhea and sneezing.   Eyes: Negative.   Respiratory: Positive for cough and shortness of breath. Negative for chest tightness.   Cardiovascular: Negative for chest pain and leg swelling.  Gastrointestinal: Positive for abdominal pain, nausea and vomiting.  Negative for blood in stool and diarrhea.  Genitourinary: Negative for difficulty urinating, flank pain, frequency and hematuria.  Musculoskeletal: Negative for arthralgias and back pain.  Skin: Negative for rash.  Neurological: Negative for dizziness, speech difficulty, weakness, numbness and headaches.     Physical Exam Updated Vital Signs BP (!) 170/78   Pulse 85   Temp 98.4 F (36.9 C) (Oral)   Resp 14   SpO2 92%   Physical Exam  Constitutional: She is oriented to person, place, and time. She appears well-developed and well-nourished.  HENT:  Head: Normocephalic and atraumatic.  Eyes: Pupils are equal, round, and reactive to light.  Neck: Normal range of motion. Neck supple.  Cardiovascular: Normal rate, regular rhythm and normal heart sounds.  Pulmonary/Chest: Effort normal and breath sounds normal. No respiratory distress. She has no wheezes. She has no rales. She exhibits no tenderness.  Abdominal: Soft. Bowel sounds are normal. She exhibits distension. There is tenderness (Generalized). There is no rebound and no guarding.  Musculoskeletal: Normal range of motion. She exhibits no edema.  Lymphadenopathy:    She has no cervical adenopathy.  Neurological: She is alert and oriented to person, place, and time.  Skin: Skin is warm and dry. No rash noted.  Psychiatric: She has a normal mood and affect.     ED Treatments / Results  Labs (all labs ordered are listed, but only abnormal results are displayed) Labs Reviewed  COMPREHENSIVE METABOLIC PANEL - Abnormal; Notable for the following components:      Result Value   Glucose, Bld 189 (*)    BUN 28 (*)    Creatinine, Ser 1.62 (*)    Albumin 2.7 (*)    Alkaline Phosphatase 140 (*)    GFR calc non Af Amer 30 (*)    GFR calc Af Amer 35 (*)    All other components within normal limits  CBC - Abnormal; Notable for the following components:   WBC 13.5 (*)    Hemoglobin 11.7 (*)    All other components within normal  limits  URINALYSIS, ROUTINE W REFLEX MICROSCOPIC - Abnormal; Notable for the following components:   Color, Urine AMBER (*)    APPearance CLOUDY (*)    Protein, ur >=300 (*)    Leukocytes, UA LARGE (*)    Bacteria, UA MANY (*)    Squamous Epithelial / LPF 0-5 (*)    All other components within normal limits  URINE CULTURE  LIPASE, BLOOD  I-STAT CG4 LACTIC ACID, ED  I-STAT CG4 LACTIC ACID, ED    EKG None  Radiology Dg Chest 2 View  Result Date: 11/30/2017 CLINICAL DATA:  Cough and fever EXAM: CHEST - 2 VIEW COMPARISON:  07/26/2017, 08/15/2015 FINDINGS: Mild diffuse chronic bronchitic changes. Scarring or atelectasis at both lung bases. Possible trace pleural effusions. Stable cardiomediastinal silhouette with aortic atherosclerosis. No pneumothorax. Mild kyphosis of the spine with osteopenia. Multiple thoracic compression deformities as before. IMPRESSION: Similar appearance of chronic bronchitic changes and probable scarring at the bilateral lung bases. Possible tiny pleural effusions or thickening. Electronically Signed   By: Donavan Foil M.D.   On: 11/30/2017 18:06   Ct Abdomen Pelvis W Contrast  Result Date: 11/30/2017 CLINICAL DATA:  Nausea vomiting and body ache with fever EXAM: CT ABDOMEN AND PELVIS WITH CONTRAST TECHNIQUE: Multidetector CT imaging of the abdomen and pelvis was performed using the standard protocol following bolus administration of intravenous contrast. CONTRAST:  160mL ISOVUE-300 IOPAMIDOL (ISOVUE-300) INJECTION 61% COMPARISON:  CT 05/09/2015 FINDINGS: Lower chest: Lung bases demonstrate streaky atelectasis  or scarring. No acute consolidation or pleural effusion. Normal heart size. Small moderate hiatal hernia. Hepatobiliary: No calcified gallstones. No biliary dilatation. Vague hypodensity within the medial right hepatic lobe near the IVC measuring 2.5 cm. Pancreas: Diffuse atrophy with multiple calcifications. Diffuse pancreatic ductal dilatation measuring up to 8  mm. No acute pancreatic inflammation Spleen: Normal in size without focal abnormality. Adrenals/Urinary Tract: Adrenal glands are within normal limits. Kidneys show no hydronephrosis. Cysts within the kidneys. Subcentimeter cortical hypodensities too small to further characterize. Intermediate density lesions, best seen on delayed views within the mid pole the left kidney measuring 17 mm and 19 mm. Urinary bladder is unremarkable aside for small amount of gas which may relate to recent instrumentation Stomach/Bowel: Status post right hemicolectomy with small bowel colon anastomosis in the right abdomen. Stomach nonenlarged. No dilated small bowel. Sigmoid colon diverticular disease without acute inflammation Vascular/Lymphatic: Extensive aortic atherosclerosis. Bilateral iliac stents. Subcentimeter retroperitoneal lymph nodes. Prominent left gonadal vessels. Reproductive: Status post hysterectomy. No adnexal masses. Other: Negative for free air or free fluid. Musculoskeletal: Degenerative changes. No acute or suspicious abnormality. IMPRESSION: 1. Status post right hemicolectomy with small bowel colon anastomosis in the right abdomen. No evidence for obstruction or bowel wall thickening. Sigmoid colon diverticular disease without definitive acute inflammation 2. Vague possible hypodense mass within the medial right lobe of the liver near the IVC. When the patient is clinically stable and able to follow directions and hold their breath (preferably as an outpatient) further evaluation with dedicated abdominal MRI should be considered. 3. Indeterminate mildly dense lesions within the mid left kidney. These may also be evaluated at MRI follow-up. 4. Changes of chronic pancreatitis. Diffuse ductal dilatation of the pancreas with multiple calcifications. No definite acute inflammation. Electronically Signed   By: Donavan Foil M.D.   On: 11/30/2017 22:40    Procedures Procedures (including critical care  time)  Medications Ordered in ED Medications  iopamidol (ISOVUE-300) 61 % injection (has no administration in time range)  sodium chloride 0.9 % bolus 500 mL (0 mLs Intravenous Stopped 11/30/17 2117)  cefTRIAXone (ROCEPHIN) 1 g in sodium chloride 0.9 % 100 mL IVPB (0 g Intravenous Stopped 11/30/17 2048)  iopamidol (ISOVUE-300) 61 % injection 100 mL (100 mLs Intravenous Contrast Given 11/30/17 2151)     Initial Impression / Assessment and Plan / ED Course  I have reviewed the triage vital signs and the nursing notes.  Pertinent labs & imaging results that were available during my care of the patient were reviewed by me and considered in my medical decision making (see chart for details).     Patient is a 72 year old female who presents with nausea vomiting and a little bit of diarrhea.  Also has some abdominal pain.  CT scan does not show any acute abnormality.  There are some nodules which will need outpatient follow-up.  I did discuss these findings with the patient.  She does have evidence of urinary tract infection.  She was given a dose of Rocephin in the ED.  She was discharged with a prescription for Keflex.  Urine culture was performed.  Her labs show a mildly elevated creatinine as compared to her prior values.  She was given some IV fluids in the ED.  Her other labs are non-concerning.  She is tolerating oral fluids without ongoing vomiting.  She has no fever or signs of sepsis.  She was discharged home in good condition.  She was encouraged to have close follow-up with her PCP.  Return precautions were given.  Final Clinical Impressions(s) / ED Diagnoses   Final diagnoses:  Nausea vomiting and diarrhea  Acute cystitis without hematuria    ED Discharge Orders        Ordered    cephALEXin (KEFLEX) 500 MG capsule  2 times daily     11/30/17 2335       Malvin Johns, MD 11/30/17 2341

## 2017-11-30 NOTE — ED Notes (Signed)
Attempted IV X 1

## 2017-11-30 NOTE — ED Notes (Signed)
Patient transported to X-ray 

## 2017-12-03 LAB — URINE CULTURE: Culture: >=100000 — AB

## 2017-12-04 ENCOUNTER — Telehealth: Payer: Self-pay | Admitting: Emergency Medicine

## 2017-12-04 NOTE — Telephone Encounter (Signed)
Post ED Visit - Positive Culture Follow-up  Culture report reviewed by antimicrobial stewardship pharmacist:  []  Elenor Quinones, Pharm.D. []  Heide Guile, Pharm.D., BCPS AQ-ID []  Parks Neptune, Pharm.D., BCPS []  Alycia Rossetti, Pharm.D., BCPS []  [place], Pharm.D., BCPS, AAHIVP []  Legrand Como, Pharm.D., BCPS, AAHIVP []  Salome Arnt, PharmD, BCPS []  Jalene Mullet, PharmD []  Vincenza Hews, PharmD, BCPS Jimmy Footman PharmD  Positive urine culture Treated with cephalexin, organism sensitive to the same and no further patient follow-up is required at this time.  Hazle Nordmann 12/04/2017, 10:23 AM

## 2017-12-13 ENCOUNTER — Ambulatory Visit
Admission: RE | Admit: 2017-12-13 | Discharge: 2017-12-13 | Disposition: A | Discharge status: Home / Self Care | Payer: Medicare Other | Source: Ambulatory Visit | Attending: Internal Medicine | Admitting: Internal Medicine

## 2017-12-13 DIAGNOSIS — Z1231 Encounter for screening mammogram for malignant neoplasm of breast: Secondary | ICD-10-CM

## 2017-12-19 ENCOUNTER — Ambulatory Visit (INDEPENDENT_AMBULATORY_CARE_PROVIDER_SITE_OTHER): Payer: Medicare Other | Admitting: Podiatry

## 2017-12-19 ENCOUNTER — Encounter: Payer: Self-pay | Admitting: Podiatry

## 2017-12-19 DIAGNOSIS — B351 Tinea unguium: Secondary | ICD-10-CM | POA: Diagnosis not present

## 2017-12-19 DIAGNOSIS — M79676 Pain in unspecified toe(s): Secondary | ICD-10-CM

## 2017-12-19 DIAGNOSIS — E1159 Type 2 diabetes mellitus with other circulatory complications: Secondary | ICD-10-CM | POA: Diagnosis not present

## 2017-12-19 NOTE — Progress Notes (Signed)
Complaint:  Visit Type: Patient returns to my office for continued preventative foot care services. Complaint: Patient states" my nails have grown long and thick and become painful to walk and wear shoes" Patient has been diagnosed with DM with angiopathy. The patient presents for preventative foot care services. No changes to ROS  Podiatric Exam: Vascular: dorsalis pedis and posterior tibial pulses are not  palpable bilateral. Capillary return is immediate. Temperature gradient is diminished. Sensorium: Normal Semmes Weinstein monofilament test. Normal tactile sensation bilaterally. Nail Exam: Pt has thick disfigured discolored nails with subungual debris noted bilateral entire nail hallux through fifth toenails Ulcer Exam: There is no evidence of ulcer or pre-ulcerative changes or infection. Orthopedic Exam: Muscle tone and strength are WNL. No limitations in general ROM. No crepitus or effusions noted. Foot type and digits show no abnormalities. Pes planus  HAV  B/L. Skin:  Porokeratosis right  hallux. No infection or ulcers  Diagnosis:  Onychomycosis, , Pain in right toe, pain in left toes  Porokeratosis right hallux.  Treatment & Plan Procedures and Treatment: Consent by patient was obtained for treatment procedures. The patient understood the discussion of treatment and procedures well. All questions were answered thoroughly reviewed. Debridement of mycotic and hypertrophic toenails, 1 through 5 bilateral and clearing of subungual debris. No ulceration, no infection noted.  Return Visit-Office Procedure: Patient instructed to return to the office for a follow up visit 3 months for continued evaluation and treatment.    Gardiner Barefoot DPM

## 2018-02-20 ENCOUNTER — Ambulatory Visit: Payer: Medicare Other | Admitting: Cardiovascular Disease

## 2018-02-21 ENCOUNTER — Encounter: Payer: Self-pay | Admitting: *Deleted

## 2018-03-09 ENCOUNTER — Ambulatory Visit (INDEPENDENT_AMBULATORY_CARE_PROVIDER_SITE_OTHER): Payer: Medicare Other | Admitting: Cardiovascular Disease

## 2018-03-09 ENCOUNTER — Encounter: Payer: Self-pay | Admitting: Cardiovascular Disease

## 2018-03-09 VITALS — BP 134/78 | HR 82 | Ht 60.0 in | Wt 135.0 lb

## 2018-03-09 DIAGNOSIS — I10 Essential (primary) hypertension: Secondary | ICD-10-CM

## 2018-03-09 DIAGNOSIS — E7800 Pure hypercholesterolemia, unspecified: Secondary | ICD-10-CM | POA: Diagnosis not present

## 2018-03-09 DIAGNOSIS — I739 Peripheral vascular disease, unspecified: Secondary | ICD-10-CM

## 2018-03-09 NOTE — Assessment & Plan Note (Signed)
History of hyperlipidemia on statin therapy. 

## 2018-03-09 NOTE — Progress Notes (Signed)
03/09/2018 [name]   19-May-1946  [id number]  Primary Physician Nolene Ebbs, MD Primary Cardiologist: [name] MD FACP, [place], [place], [place]  HPI:  [name] is a 72 y.o.  mildly overweight single [place] American female with no children, who I last saw  02/17/2017. She has a history of remote alcohol and tobacco abuse, having quit 6 to 7 years ago. She smoked 50 pack years prior to that and does have COPD. She had PVOD, status post bilateral iliac artery PTA and stenting back in 2009 with a known short-segment occlusion from mid right SFA. She does get some calf claudication, but denies chest pain or shortness of breath. She does have chronic renal insufficiency and chronic pancreatitis as well as alcoholic hepatitis and type 2 diabetes. Her last Myoview, performed March 18, 2011, was not ischemic.Her most recent Dopplers performed 10/23/15 revealed a right ABI of 1.33 and a left ABI 0.82. Her iliac stents appeared patent. She denies chest pain or shortness of breath.  Since I saw her a year ago she is remained stable.  She denies chest pain, shortness of breath or claudication.  She walks with a walker.     Current Meds  Medication Sig  . albuterol (PROVENTIL HFA;VENTOLIN HFA) 108 (90 BASE) MCG/ACT inhaler Inhale 2 puffs into the lungs every 6 (six) hours as needed. For shortness of breath  . albuterol (PROVENTIL) (2.5 MG/3ML) 0.083% nebulizer solution Take 2.5 mg by nebulization every 6 (six) hours as needed for wheezing or shortness of breath.  . allopurinol (ZYLOPRIM) 100 MG tablet Take 100 mg by mouth 2 (two) times daily.  Marland Kitchen aspirin 81 MG EC tablet Take 81 mg by mouth daily.    . carvedilol (COREG) 12.5 MG tablet Take 12.5 mg by mouth 2 (two) times daily with a meal.  . cephALEXin (KEFLEX) 500 MG capsule Take 1 capsule (500 mg total) by mouth 2 (two) times daily.  . cilostazol (PLETAL) 50 MG tablet Take 50 mg by mouth daily.   . cloNIDine (CATAPRES) 0.1 MG tablet  Take 1 tablet (0.1 mg total) by mouth 3 (three) times daily.  . ergocalciferol (VITAMIN D2) 50000 UNITS capsule Take 50,000 Units by mouth every Monday. On Monday  . ferrous sulfate 325 (65 FE) MG tablet Take 325 mg by mouth daily with breakfast.  . fluticasone (FLONASE) 50 MCG/ACT nasal spray Place 2 sprays into the nose daily as needed for allergies.   . folic acid (FOLVITE) 1 MG tablet Take 1 tablet (1 mg total) by mouth daily.  . furosemide (LASIX) 20 MG tablet Take 1 tablet (20 mg total) by mouth daily.  Marland Kitchen HYDROcodone-acetaminophen (NORCO/VICODIN) 5-325 MG tablet Take 1-2 tablets by mouth every 6 (six) hours as needed for severe pain.  Marland Kitchen LANTUS SOLOSTAR 100 UNIT/ML Solostar Pen Inject 5 Units as directed at bedtime.   Marland Kitchen lisinopril (PRINIVIL,ZESTRIL) 5 MG tablet Take 5 mg by mouth daily.  . magnesium oxide (MAG-OX) 400 MG tablet Take 400 mg by mouth daily.  . methocarbamol (ROBAXIN) 500 MG tablet Take 1 tablet (500 mg total) by mouth every 8 (eight) hours as needed for muscle spasms.  . metoprolol (LOPRESSOR) 50 MG tablet Take 50 mg by mouth daily.  . montelukast (SINGULAIR) 10 MG tablet Take 10 mg by mouth at bedtime.   Marland Kitchen omeprazole (PRILOSEC) 40 MG capsule Take 40 mg by mouth daily.   . OXYGEN-HELIUM IN Inhale 2 L into the lungs at bedtime. 2  liters nightly  . simvastatin (ZOCOR) 10 MG tablet Take 10 mg by mouth daily.   . sitaGLIPtin (JANUVIA) 50 MG tablet Take 1 tablet by mouth daily.  Marland Kitchen spironolactone (ALDACTONE) 25 MG tablet Take 0.5 tablets (12.5 mg total) by mouth daily.  Marland Kitchen thiamine 100 MG tablet Take 1 tablet (100 mg total) by mouth daily.  Marland Kitchen tiZANidine (ZANAFLEX) 2 MG tablet Take 2 mg by mouth 2 (two) times daily as needed.  . traMADol (ULTRAM) 50 MG tablet Take 1 tablet (50 mg total) by mouth every 6 (six) hours as needed. (Patient taking differently: Take 50 mg by mouth every 6 (six) hours as needed for moderate pain. )  . traMADol (ULTRAM) 50 MG tablet Take 1 tablet (50 mg  total) by mouth 3 (three) times daily as needed.     Allergies  Allergen Reactions  . Compazine [Prochlorperazine Edisylate] Other (See Comments)    Altered mental status  . Penicillins Hives    Has patient had a PCN reaction causing immediate rash, facial/tongue/throat swelling, SOB or lightheadedness with hypotension: No Has patient had a PCN reaction causing severe rash involving mucus membranes or skin necrosis: No Has patient had a PCN reaction that required hospitalization No Has patient had a PCN reaction occurring within the last 10 years: No If all of the above answers are "NO", then may proceed with Cephalosporin use.   Marland Kitchen Phenergan [Promethazine Hcl] Other (See Comments)    Altered mental status  . Lipitor [Atorvastatin] Swelling    Social History   Socioeconomic History  . Marital status: Single    Spouse name: Not on file  . Number of children: Not on file  . Years of education: Not on file  . Highest education level: Not on file  Occupational History  . Not on file  Social Needs  . Financial resource strain: Not on file  . Food insecurity:    Worry: Not on file    Inability: Not on file  . Transportation needs:    Medical: Not on file    Non-medical: Not on file  Tobacco Use  . Smoking status: Current Some Day Smoker    Packs/day: 0.50    Years: 30.00    Pack years: 15.00    Types: Cigarettes    Last attempt to quit: 11/06/2005    Years since quitting: 12.3  . Smokeless tobacco: Never Used  Substance and Sexual Activity  . Alcohol use: No    Comment: stopped 2005  . Drug use: No  . Sexual activity: Not on file  Lifestyle  . Physical activity:    Days per week: Not on file    Minutes per session: Not on file  . Stress: Not on file  Relationships  . Social connections:    Talks on phone: Not on file    Gets together: Not on file    Attends religious service: Not on file    Active member of club or organization: Not on file    Attends meetings of  clubs or organizations: Not on file    Relationship status: Not on file  . Intimate partner violence:    Fear of current or ex partner: Not on file    Emotionally abused: Not on file    Physically abused: Not on file    Forced sexual activity: Not on file  Other Topics Concern  . Not on file  Social History Narrative  . Not on file     Review  of Systems: General: negative for chills, fever, night sweats or weight changes.  Cardiovascular: negative for chest pain, dyspnea on exertion, edema, orthopnea, palpitations, paroxysmal nocturnal dyspnea or shortness of breath Dermatological: negative for rash Respiratory: negative for cough or wheezing Urologic: negative for hematuria Abdominal: negative for nausea, vomiting, diarrhea, bright red blood per rectum, melena, or hematemesis Neurologic: negative for visual changes, syncope, or dizziness All other systems reviewed and are otherwise negative except as noted above.    Blood pressure 134/78, pulse 82, height 5' (1.524 m), weight 135 lb (61.2 kg).  General appearance: alert and no distress Neck: no adenopathy, no carotid bruit, no JVD, supple, symmetrical, trachea midline and thyroid not enlarged, symmetric, no tenderness/mass/nodules Lungs: clear to auscultation bilaterally Heart: regular rate and rhythm, S1, S2 normal, no murmur, click, rub or gallop Extremities: extremities normal, atraumatic, no cyanosis or edema Pulses: 2+ and symmetric Skin: Skin color, texture, turgor normal. No rashes or lesions Neurologic: Alert and oriented X 3, normal strength and tone. Normal symmetric reflexes. Normal coordination and gait  EKG normal sinus rhythm at 82 without ST or T wave changes.  I personally reviewed this EKG  ASSESSMENT AND PLAN:   PVD, LEIA/LIIA and RCIA pta 7/09- ABIs 0.68 and 0.69 Feb 2014 History of peripheral arterial disease status post bilateral iliac stenting by myself back in 2009 with a known short segment occlusion  of her mid right SFA.  Her last Doppler studies performed 10/23/2015 revealed patent iliac stents.  She denies significant lifestyle limiting claudication.  Hyperlipidemia History of hyperlipidemia on statin therapy  Essential hypertension History of essential hypertension her blood pressure measured today at 134/78.  She is on carvedilol, clonidine, lisinopril and metoprolol.      [name] MD FACP,FACC,FAHA, [facility] 03/09/2018 2:02 PM

## 2018-03-09 NOTE — Assessment & Plan Note (Signed)
History of essential hypertension her blood pressure measured today at 134/78.  She is on carvedilol, clonidine, lisinopril and metoprolol.

## 2018-03-09 NOTE — Assessment & Plan Note (Signed)
History of peripheral arterial disease status post bilateral iliac stenting by myself back in 2009 with a known short segment occlusion of her mid right SFA.  Her last Doppler studies performed 10/23/2015 revealed patent iliac stents.  She denies significant lifestyle limiting claudication.

## 2018-03-09 NOTE — Patient Instructions (Signed)
Your physician wants you to follow-up in: ONE YEAR WITH DR BERRY You will receive a reminder letter in the mail two months in advance. If you don't receive a letter, please call our office to schedule the follow-up appointment.   If you need a refill on your cardiac medications before your next appointment, please call your pharmacy.  

## 2018-03-20 ENCOUNTER — Ambulatory Visit: Payer: Medicare Other | Admitting: Podiatry

## 2018-04-20 ENCOUNTER — Ambulatory Visit: Payer: Medicare Other | Admitting: Podiatry

## 2018-05-06 ENCOUNTER — Encounter (HOSPITAL_COMMUNITY): Payer: Self-pay | Admitting: Pharmacy Technician

## 2018-05-06 ENCOUNTER — Emergency Department (HOSPITAL_COMMUNITY)
Admission: EM | Admit: 2018-05-06 | Discharge: 2018-05-06 | Disposition: A | Discharge status: Home / Self Care | Payer: Medicare Other | Attending: Emergency Medicine | Admitting: Emergency Medicine

## 2018-05-06 ENCOUNTER — Other Ambulatory Visit: Payer: Self-pay

## 2018-05-06 DIAGNOSIS — I130 Hypertensive heart and chronic kidney disease with heart failure and stage 1 through stage 4 chronic kidney disease, or unspecified chronic kidney disease: Secondary | ICD-10-CM | POA: Diagnosis not present

## 2018-05-06 DIAGNOSIS — F17210 Nicotine dependence, cigarettes, uncomplicated: Secondary | ICD-10-CM | POA: Diagnosis not present

## 2018-05-06 DIAGNOSIS — J449 Chronic obstructive pulmonary disease, unspecified: Secondary | ICD-10-CM | POA: Insufficient documentation

## 2018-05-06 DIAGNOSIS — N183 Chronic kidney disease, stage 3 (moderate): Secondary | ICD-10-CM | POA: Insufficient documentation

## 2018-05-06 DIAGNOSIS — Z79899 Other long term (current) drug therapy: Secondary | ICD-10-CM | POA: Insufficient documentation

## 2018-05-06 DIAGNOSIS — Z794 Long term (current) use of insulin: Secondary | ICD-10-CM | POA: Insufficient documentation

## 2018-05-06 DIAGNOSIS — M109 Gout, unspecified: Secondary | ICD-10-CM | POA: Insufficient documentation

## 2018-05-06 DIAGNOSIS — E119 Type 2 diabetes mellitus without complications: Secondary | ICD-10-CM | POA: Diagnosis not present

## 2018-05-06 DIAGNOSIS — I5032 Chronic diastolic (congestive) heart failure: Secondary | ICD-10-CM | POA: Diagnosis not present

## 2018-05-06 DIAGNOSIS — M79671 Pain in right foot: Secondary | ICD-10-CM | POA: Diagnosis present

## 2018-05-06 LAB — I-STAT CHEM 8, ED
BUN: 18 mg/dL (ref 8–23)
CALCIUM ION: 1 mmol/L — AB (ref 1.15–1.40)
CHLORIDE: 107 mmol/L (ref 98–111)
CREATININE: 1.5 mg/dL — AB (ref 0.44–1.00)
Glucose, Bld: 119 mg/dL — ABNORMAL HIGH (ref 70–99)
HCT: 43 % (ref 36.0–46.0)
Hemoglobin: 14.6 g/dL (ref 12.0–15.0)
Potassium: 4.2 mmol/L (ref 3.5–5.1)
Sodium: 137 mmol/L (ref 135–145)
TCO2: 26 mmol/L (ref 22–32)

## 2018-05-06 MED ORDER — TRAMADOL HCL 50 MG PO TABS: 50.0000 mg | ORAL_TABLET | Freq: Four times a day (QID) | ORAL | 0 refills | Status: DC | PRN

## 2018-05-06 MED ORDER — TRAMADOL HCL 50 MG PO TABS
50.0000 mg | ORAL_TABLET | Freq: Once | ORAL | Status: AC
  Administered 2018-05-06: 50 mg via ORAL
  Filled 2018-05-06: qty 1

## 2018-05-06 MED ORDER — NAPROXEN 375 MG PO TABS: 375.0000 mg | ORAL_TABLET | Freq: Two times a day (BID) | ORAL | 0 refills | Status: DC | PRN

## 2018-05-06 NOTE — Discharge Instructions (Signed)
It was my pleasure taking care of you today!   Take Naproxen twice daily. Ultram (tramadol) only as needed for severe pain - This can make you very drowsy - please do not drink alcohol, operate heavy machinery or drive on this medication. Be very careful as this medication can affect your balance and make you more likely to have a fall.  Your blood pressure was elevated in the emergency department today.  Please go home and take your blood pressure medication.   Please follow-up with your primary care doctor.   Return to ER for fever, new or worsening symptoms, any additional concerns.

## 2018-05-06 NOTE — ED Notes (Signed)
Pt neurologically intact at discharge. Pt to go home and take home BP meds. Patient verbalizes understanding of discharge instructions. Opportunity for questioning and answers were provided.

## 2018-05-06 NOTE — ED Triage Notes (Signed)
Pt arrives via [facility] from home with R foot pain causing difficulty ambulating. Pt in NAD. BP 170/100 (has not taken morning BP meds), P 83, 93% RA.

## 2018-05-06 NOTE — ED Provider Notes (Signed)
[facility] EMERGENCY DEPARTMENT Provider Note   CSN: [id number] Arrival date & time: 05/06/18  7062     History   Chief Complaint Chief Complaint  Patient presents with  . Foot Pain    HPI [name] is a 72 y.o. female.  The history is provided by the patient and medical records. No language interpreter was used.   [name] is a 72 y.o. female  with an extensive PMH as listed below including DM, COPD, CHF, gout who presents to the Emergency Department complaining of constant right toe and dorsal foot pain x 3 days.  Patient states that she was sitting on a bench eating not better when he pain began.  She denies any known injury or trauma.  He reports a history of gout and notes that this feels similar.  She states that she has not had a gout flare in about 19 years though.  No alleviating factors noted.  She is usually ambulatory at home fairly independently, although does have to use a cane.  She has not been able to ambulate on the foot due to her pain.  She denies any fever or chills.  No overlying skin changes.  Past Medical History:  Diagnosis Date  . Acute venous embolism and thrombosis of deep vessels of proximal lower extremity ([place])   . Alcoholic cirrhosis ([place])   . Anemia   . Anxiety   . Arthritis   . Asthma   . Back pain   . Blood transfusion few years ago  . Cellulitis    bilateral lower extremities  . CHF (congestive heart failure) ([place]) 10/13   grade 1 diastolic dysfunction, Nl LVF  . Chronic diastolic congestive heart failure ([place])   . Chronic obstructive pulmonary disease (COPD) ([place])   . Colon polyps   . Depression   . Diabetes mellitus    insulin dep   . Gastroesophageal reflux   . Glaucoma    left  . Gout   . Hepatitis    alcoholic hepatitis  . History of GI diverticular bleed   . Hypertension   . Leg swelling   . On home oxygen therapy oxygen 2 liter per minute prn per [place]  . Pneumonia    history of  . PVD  (peripheral vascular disease) ([place]) 2009   bilat iliac stenting  . Reflux esophagitis   . Villous adenoma of colon 05/10/11    Patient Active Problem List   Diagnosis Date Noted  . Gout 08/10/2015  . Hypoglycemia   . Hypothermia   . UTI (lower urinary tract infection)   . Humeral head fracture 06/05/2015  . Idiopathic chronic gout of multiple sites without tophus 06/05/2015  . Chronic renal disease, stage III ([place]) 06/05/2015  . Nausea vomiting and diarrhea 05/09/2015  . Increased urinary frequency 05/09/2015  . Sepsis ([place]) 05/09/2015  . Hypokalemia   . Chronic diastolic congestive heart failure ([place])   . Hyperlipidemia 02/10/2015  . Cellulitis 11/04/2014  . Essential hypertension 08/27/2014  . Abdominal pain, acute, bilateral lower quadrant 07/26/2014  . Colitis 07/26/2014  . GI bleeding 06/14/2014  . GI bleed 06/14/2014  . Acute encephalopathy 06/14/2014  . Personal history of colonic polyps 08/06/2013  . Knee pain, acute 05/20/2013  . Claudication ([place]) 03/13/2013  . Obesity 03/13/2013  . PVD, LEIA/LIIA and RCIA pta 7/09- ABIs 0.68 and 0.69 Feb 2014 11/17/2012  . PUD (peptic ulcer disease) GI bleed in past (2011) 11/17/2012  . Cirrhosis  of liver ([place]) 11/17/2012  . Colon cancer, lap chole 2012 11/17/2012  . 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 Atypical chest pain- no history of CAD. Myoview low risk 2012 11/15/2012  . Controlled diabetes mellitus type 2 with complications ([place]) 16/60/6301  . Acute renal failure superimposed on stage 3 chronic kidney disease ([place]) 06/16/2012  . Incisional hernia 01/09/2012  . Anemia 01/28/2011  . COPD (chronic obstructive pulmonary disease) ([place]) 01/28/2011  . Sleep apnea 01/28/2011  . Transfusion history 01/28/2011  . Sinus problem 01/28/2011  . Full dentures 01/28/2011    Past Surgical History:  Procedure Laterality Date  . ABDOMINAL HYSTERECTOMY  yrs ago  . CATARACT EXTRACTION Right yrs ago  .  COLONOSCOPY    . COLONOSCOPY WITH PROPOFOL N/A 08/06/2013   Procedure: COLONOSCOPY WITH PROPOFOL;  Surgeon: Lear Ng, MD;  Location: [facility] END[facility];  Service: Endoscopy;  Laterality: N/A;  . ESOPHAGOGASTRODUODENOSCOPY (EGD) WITH PROPOFOL N/A 08/06/2013   Procedure: ESOPHAGOGASTRODUODENOSCOPY (EGD) WITH PROPOFOL;  Surgeon: Lear Ng, MD;  Location: [facility];  Service: Endoscopy;  Laterality: N/A;  . LAPAROSCOPIC ASSISSTED TOTAL COLECTOMY W/ J-POUCH  04/22/11  . VENTRAL HERNIA REPAIR  06/13/2012   Procedure: LAPAROSCOPIC VENTRAL HERNIA;  Surgeon: Adin Hector, MD;  Location: [facility];  Service: General;  Laterality: N/A;  Laparoscopic Assisted Ventral Hernia with Mesh     OB History   None      Home Medications    Prior to Admission medications   Medication Sig Start Date End Date Taking? Authorizing Provider  albuterol (PROVENTIL HFA;VENTOLIN HFA) 108 (90 BASE) MCG/ACT inhaler Inhale 2 puffs into the lungs every 6 (six) hours as needed. For shortness of breath    [provider]  albuterol (PROVENTIL) (2.5 MG/3ML) 0.083% nebulizer solution Take 2.5 mg by nebulization every 6 (six) hours as needed for wheezing or shortness of breath.    [provider]  allopurinol (ZYLOPRIM) 100 MG tablet Take 100 mg by mouth 2 (two) times daily. 01/18/16   [provider]  aspirin 81 MG EC tablet Take 81 mg by mouth daily.      [provider]  cephALEXin (KEFLEX) 500 MG capsule Take 1 capsule (500 mg total) by mouth 2 (two) times daily. 11/30/17   Malvin Johns, MD  cilostazol (PLETAL) 50 MG tablet Take 50 mg by mouth daily.  01/18/16   [provider]  cloNIDine (CATAPRES) 0.1 MG tablet Take 1 tablet (0.1 mg total) by mouth 3 (three) times daily. 09/05/14   [name], MD  ergocalciferol (VITAMIN D2) 50000 UNITS capsule Take 50,000 Units by mouth every Monday. On Monday    [provider]  ferrous sulfate 325 (65 FE) MG tablet  Take 325 mg by mouth daily with breakfast.    [provider]  fluticasone (FLONASE) 50 MCG/ACT nasal spray Place 2 sprays into the nose daily as needed for allergies.     [provider]  folic acid (FOLVITE) 1 MG tablet Take 1 tablet (1 mg total) by mouth daily. 08/18/15   Hongalgi, Lenis Dickinson, MD  furosemide (LASIX) 20 MG tablet Take 1 tablet (20 mg total) by mouth daily. 08/18/15   Hongalgi, Lenis Dickinson, MD  HYDROcodone-acetaminophen (NORCO/VICODIN) 5-325 MG tablet Take 1-2 tablets by mouth every 6 (six) hours as needed for severe pain. 01/24/17   Little, Wenda Overland, MD  LANTUS SOLOSTAR 100 UNIT/ML Solostar Pen Inject 5 Units as directed at bedtime.  01/18/16  [provider]  lisinopril (PRINIVIL,ZESTRIL) 5 MG tablet Take 5 mg by mouth daily. 01/18/16   [provider]  magnesium oxide (MAG-OX) 400 MG tablet Take 400 mg by mouth daily.    [provider]  methocarbamol (ROBAXIN) 500 MG tablet Take 1 tablet (500 mg total) by mouth every 8 (eight) hours as needed for muscle spasms. 02/17/16   Mesner, Corene Cornea, MD  metoprolol (LOPRESSOR) 50 MG tablet Take 50 mg by mouth daily. 01/18/16   [provider]  montelukast (SINGULAIR) 10 MG tablet Take 10 mg by mouth at bedtime.     [provider]  naproxen (NAPROSYN) 375 MG tablet Take 1 tablet (375 mg total) by mouth 2 (two) times daily as needed. 05/06/18   Ward, Ozella Almond, PA-C  omeprazole (PRILOSEC) 40 MG capsule Take 40 mg by mouth daily.     [provider]  OXYGEN-HELIUM IN Inhale 2 L into the lungs at bedtime. 2 liters nightly    [provider]  simvastatin (ZOCOR) 10 MG tablet Take 10 mg by mouth daily.  11/30/14   [provider]  sitaGLIPtin (JANUVIA) 50 MG tablet Take 1 tablet by mouth daily. 03/27/14   [provider]  spironolactone (ALDACTONE) 25 MG tablet Take 0.5 tablets (12.5 mg total) by mouth daily. 08/18/15   Hongalgi, Lenis Dickinson, MD  thiamine 100  MG tablet Take 1 tablet (100 mg total) by mouth daily. 08/18/15   Hongalgi, Lenis Dickinson, MD  tiZANidine (ZANAFLEX) 2 MG tablet Take 2 mg by mouth 2 (two) times daily as needed. 01/04/17   [provider]  traMADol (ULTRAM) 50 MG tablet Take 1 tablet (50 mg total) by mouth every 6 (six) hours as needed. 05/06/18   Ward, Ozella Almond, PA-C    Family History Family History  Problem Relation Age of Onset  . Cancer Brother        heent ca  . Cancer Sister        liver ca    Social History Social History   Tobacco Use  . Smoking status: Current Some Day Smoker    Packs/day: 0.50    Years: 30.00    Pack years: 15.00    Types: Cigarettes    Last attempt to quit: 11/06/2005    Years since quitting: 12.5  . Smokeless tobacco: Never Used  Substance Use Topics  . Alcohol use: No    Comment: stopped 2005  . Drug use: No     Allergies   Compazine [prochlorperazine edisylate]; Penicillins; Phenergan [promethazine hcl]; and Lipitor [atorvastatin]   Review of Systems Review of Systems  Constitutional: Negative for chills and fever.  Musculoskeletal: Positive for arthralgias and myalgias.  Skin: Negative for color change.  Neurological: Negative for weakness and numbness.     Physical Exam Updated Vital Signs BP (!) 213/108 (BP Location: Left Arm)   Pulse 87   Temp 99.2 F (37.3 C) (Oral)   Resp 18   SpO2 99%   Physical Exam  Constitutional: She appears well-developed and well-nourished. No distress.  HENT:  Head: Normocephalic and atraumatic.  Neck: Neck supple.  Cardiovascular: Normal rate, regular rhythm and normal heart sounds.  No murmur heard. Pulmonary/Chest: Effort normal and breath sounds normal. No respiratory distress. She has no wheezes. She has no rales.  Musculoskeletal:  Tenderness to outpatient of right MTP and dorsum of the foot.  Decreased range of motion, likely secondary to pain.  Does have some slight warmth to this area,  however no overlying  erythema.  Good cap refill.  Able to wiggle all toes without difficulty. 2+ DP.  Neurological: She is alert.  Skin: Skin is warm and dry.  Nursing note and vitals reviewed.    ED Treatments / Results  Labs (all labs ordered are listed, but only abnormal results are displayed) Labs Reviewed  I-STAT CHEM 8, ED - Abnormal; Notable for the following components:      Result Value   Creatinine, Ser 1.50 (*)    Glucose, Bld 119 (*)    Calcium, Ion 1.00 (*)    All other components within normal limits    EKG None  Radiology No results found.  Procedures Procedures (including critical care time)  Medications Ordered in ED Medications  traMADol (ULTRAM) tablet 50 mg (50 mg Oral Given 05/06/18 0855)     Initial Impression / Assessment and Plan / ED Course  I have reviewed the triage vital signs and the nursing notes.  Pertinent labs & imaging results that were available during my care of the patient were reviewed by me and considered in my medical decision making (see chart for details).    [name] is a 72 y.o. female who presents to ED for atraumatic right foot pain consistent with her previous gout flares.  Exam consistent with gout as well.  Will treat with very short course of NSAID and Ultram.  She will follow-up with her primary care doctor.  Her blood pressure was elevated in the emergency department today.  Her creatinine is stable.  She is asymptomatic from elevated blood pressure and has not taken her morning blood pressure medication today.  She will go home and take BP medication and follow-up with her PCP for blood pressure recheck in the next few days.  Reasons to return to the emergency department were discussed and all questions answered  Patient discussed with Dr. Vanita Panda who agrees with treatment plan.    Final Clinical Impressions(s) / ED Diagnoses   Final diagnoses:  Acute gout of right foot, unspecified cause    ED Discharge Orders         Ordered      naproxen (NAPROSYN) 375 MG tablet  2 times daily PRN     05/06/18 0946    traMADol (ULTRAM) 50 MG tablet  Every 6 hours PRN     05/06/18 0946           Ward, Ozella Almond, PA-C 05/06/18 9150    Carmin Muskrat, MD 05/06/18 1000

## 2018-06-01 ENCOUNTER — Encounter: Payer: Self-pay | Admitting: Podiatry

## 2018-06-01 ENCOUNTER — Ambulatory Visit (INDEPENDENT_AMBULATORY_CARE_PROVIDER_SITE_OTHER): Payer: Medicare Other | Admitting: Podiatry

## 2018-06-01 DIAGNOSIS — B351 Tinea unguium: Secondary | ICD-10-CM

## 2018-06-01 DIAGNOSIS — E1159 Type 2 diabetes mellitus with other circulatory complications: Secondary | ICD-10-CM | POA: Diagnosis not present

## 2018-06-01 DIAGNOSIS — M79676 Pain in unspecified toe(s): Secondary | ICD-10-CM | POA: Diagnosis not present

## 2018-06-01 NOTE — Progress Notes (Signed)
Complaint:  Visit Type: Patient returns to my office for continued preventative foot care services. Complaint: Patient states" my nails have grown long and thick and become painful to walk and wear shoes" Patient has been diagnosed with DM with angiopathy. The patient presents for preventative foot care services. No changes to ROS  Podiatric Exam: Vascular: dorsalis pedis and posterior tibial pulses are not  palpable bilateral. Capillary return is immediate. Temperature gradient is diminished. Sensorium: Normal Semmes Weinstein monofilament test. Normal tactile sensation bilaterally. Nail Exam: Pt has thick disfigured discolored nails with subungual debris noted bilateral entire nail hallux through fifth toenails Ulcer Exam: There is no evidence of ulcer or pre-ulcerative changes or infection. Orthopedic Exam: Muscle tone and strength are WNL. No limitations in general ROM. No crepitus or effusions noted. Foot type and digits show no abnormalities. Pes planus  HAV  B/L. Skin:  Asymptomatic porokeratosis right  hallux. No infection or ulcers  Diagnosis:  Onychomycosis, , Pain in right toe, pain in left toes .  Treatment & Plan Procedures and Treatment: Consent by patient was obtained for treatment procedures. The patient understood the discussion of treatment and procedures well. All questions were answered thoroughly reviewed. Debridement of mycotic and hypertrophic toenails, 1 through 5 bilateral and clearing of subungual debris. No ulceration, no infection noted.  Return Visit-Office Procedure: Patient instructed to return to the office for a follow up visit 3 months for continued evaluation and treatment.    Gardiner Barefoot DPM

## 2018-08-20 ENCOUNTER — Emergency Department (HOSPITAL_COMMUNITY)
Admission: EM | Admit: 2018-08-20 | Discharge: 2018-08-21 | Disposition: A | Discharge status: Home / Self Care | Payer: Medicare Other | Attending: Emergency Medicine | Admitting: Emergency Medicine

## 2018-08-20 ENCOUNTER — Encounter (HOSPITAL_COMMUNITY): Payer: Self-pay

## 2018-08-20 ENCOUNTER — Emergency Department (HOSPITAL_COMMUNITY): Payer: Medicare Other

## 2018-08-20 DIAGNOSIS — D649 Anemia, unspecified: Secondary | ICD-10-CM | POA: Diagnosis not present

## 2018-08-20 DIAGNOSIS — I5032 Chronic diastolic (congestive) heart failure: Secondary | ICD-10-CM | POA: Diagnosis not present

## 2018-08-20 DIAGNOSIS — E119 Type 2 diabetes mellitus without complications: Secondary | ICD-10-CM | POA: Insufficient documentation

## 2018-08-20 DIAGNOSIS — Z79899 Other long term (current) drug therapy: Secondary | ICD-10-CM | POA: Insufficient documentation

## 2018-08-20 DIAGNOSIS — Z794 Long term (current) use of insulin: Secondary | ICD-10-CM | POA: Insufficient documentation

## 2018-08-20 DIAGNOSIS — E860 Dehydration: Secondary | ICD-10-CM | POA: Diagnosis not present

## 2018-08-20 DIAGNOSIS — F17210 Nicotine dependence, cigarettes, uncomplicated: Secondary | ICD-10-CM | POA: Insufficient documentation

## 2018-08-20 DIAGNOSIS — R000 Tachycardia, unspecified: Secondary | ICD-10-CM | POA: Diagnosis present

## 2018-08-20 DIAGNOSIS — Z86718 Personal history of other venous thrombosis and embolism: Secondary | ICD-10-CM | POA: Diagnosis not present

## 2018-08-20 LAB — CBC WITH DIFFERENTIAL/PLATELET
Abs Immature Granulocytes: 0.06 10*3/uL (ref 0.00–0.07)
BASOS ABS: 0 10*3/uL (ref 0.0–0.1)
BASOS PCT: 0 %
EOS ABS: 0.1 10*3/uL (ref 0.0–0.5)
EOS PCT: 1 %
HCT: 32.4 % — ABNORMAL LOW (ref 36.0–46.0)
Hemoglobin: 9.4 g/dL — ABNORMAL LOW (ref 12.0–15.0)
Immature Granulocytes: 1 %
Lymphocytes Relative: 19 %
Lymphs Abs: 2.4 10*3/uL (ref 0.7–4.0)
MCH: 25.2 pg — ABNORMAL LOW (ref 26.0–34.0)
MCHC: 29 g/dL — ABNORMAL LOW (ref 30.0–36.0)
MCV: 86.9 fL (ref 80.0–100.0)
Monocytes Absolute: 1.3 10*3/uL — ABNORMAL HIGH (ref 0.1–1.0)
Monocytes Relative: 10 %
Neutro Abs: 8.9 10*3/uL — ABNORMAL HIGH (ref 1.7–7.7)
Neutrophils Relative %: 69 %
Platelets: 317 10*3/uL (ref 150–400)
RBC: 3.73 MIL/uL — AB (ref 3.87–5.11)
RDW: 15 % (ref 11.5–15.5)
WBC: 12.7 10*3/uL — AB (ref 4.0–10.5)
nRBC: 0 % (ref 0.0–0.2)

## 2018-08-20 LAB — COMPREHENSIVE METABOLIC PANEL
ALK PHOS: 194 U/L — AB (ref 38–126)
ALT: 13 U/L (ref 0–44)
AST: 34 U/L (ref 15–41)
Albumin: 1.9 g/dL — ABNORMAL LOW (ref 3.5–5.0)
Anion gap: 8 (ref 5–15)
BUN: 25 mg/dL — ABNORMAL HIGH (ref 8–23)
CALCIUM: 8.8 mg/dL — AB (ref 8.9–10.3)
CO2: 26 mmol/L (ref 22–32)
CREATININE: 1.78 mg/dL — AB (ref 0.44–1.00)
Chloride: 104 mmol/L (ref 98–111)
GFR, EST AFRICAN AMERICAN: 32 mL/min — AB (ref >60)
GFR, EST NON AFRICAN AMERICAN: 28 mL/min — AB (ref >60)
Glucose, Bld: 220 mg/dL — ABNORMAL HIGH (ref 70–99)
Potassium: 6.1 mmol/L — ABNORMAL HIGH (ref 3.5–5.1)
Sodium: 138 mmol/L (ref 135–145)
Total Bilirubin: 0.6 mg/dL (ref 0.3–1.2)
Total Protein: 5.8 g/dL — ABNORMAL LOW (ref 6.5–8.1)

## 2018-08-20 LAB — BASIC METABOLIC PANEL
ANION GAP: 10 (ref 5–15)
BUN: 24 mg/dL — ABNORMAL HIGH (ref 8–23)
CALCIUM: 8.6 mg/dL — AB (ref 8.9–10.3)
CHLORIDE: 107 mmol/L (ref 98–111)
CO2: 22 mmol/L (ref 22–32)
Creatinine, Ser: 1.57 mg/dL — ABNORMAL HIGH (ref 0.44–1.00)
GFR calc Af Amer: 37 mL/min — ABNORMAL LOW (ref >60)
GFR calc non Af Amer: 32 mL/min — ABNORMAL LOW (ref >60)
GLUCOSE: 212 mg/dL — AB (ref 70–99)
Potassium: 5.5 mmol/L — ABNORMAL HIGH (ref 3.5–5.1)
Sodium: 139 mmol/L (ref 135–145)

## 2018-08-20 LAB — CBG MONITORING, ED: GLUCOSE-CAPILLARY: 179 mg/dL — AB (ref 70–99)

## 2018-08-20 LAB — D-DIMER, QUANTITATIVE: D-Dimer, Quant: 2.01 ug/mL-FEU — ABNORMAL HIGH (ref 0.00–0.50)

## 2018-08-20 LAB — URINALYSIS, ROUTINE W REFLEX MICROSCOPIC
Bilirubin Urine: NEGATIVE
Glucose, UA: NEGATIVE mg/dL
Hgb urine dipstick: NEGATIVE
KETONES UR: NEGATIVE mg/dL
Leukocytes, UA: NEGATIVE
NITRITE: NEGATIVE
PH: 7.5 (ref 5.0–8.0)
Protein, ur: 100 mg/dL — AB
Specific Gravity, Urine: 1.015 (ref 1.005–1.030)

## 2018-08-20 LAB — URINALYSIS, MICROSCOPIC (REFLEX)

## 2018-08-20 LAB — POC OCCULT BLOOD, ED: Fecal Occult Bld: NEGATIVE

## 2018-08-20 LAB — TROPONIN I

## 2018-08-20 MED ORDER — SODIUM CHLORIDE 0.9 % IV BOLUS
1000.0000 mL | Freq: Once | INTRAVENOUS | Status: AC
  Administered 2018-08-20: 1000 mL via INTRAVENOUS

## 2018-08-20 MED ORDER — METOPROLOL TARTRATE 5 MG/5ML IV SOLN
5.0000 mg | Freq: Once | INTRAVENOUS | Status: AC
  Administered 2018-08-20: 5 mg via INTRAVENOUS
  Filled 2018-08-20: qty 5

## 2018-08-20 MED ORDER — IOPAMIDOL (ISOVUE-370) INJECTION 76%
INTRAVENOUS | Status: AC
  Filled 2018-08-20: qty 100

## 2018-08-20 NOTE — ED Triage Notes (Signed)
Pt from home via ems; called out for possible hypoglycemia; recent change in insulin from 20 to 30 units; CBG 180 w/ ems, however pt found to be in sinus tach at 130-140 bpm; hx CHF; dizzy upon standing, negative orthostatics  137/86 98% RA RR 18

## 2018-08-20 NOTE — ED Provider Notes (Signed)
[facility] EMERGENCY DEPARTMENT Provider Note   CSN: [id number] Arrival date & time: 08/20/18  1643     History   Chief Complaint Chief Complaint  Patient presents with  . Tachycardia    HPI [name] is a 72 y.o. female.  Pt presents to the ED today with tachycardia.  The pt called EMS today because she did not feel right.  She thought it was her blood sugar as her insulin dose was recently increased.  Her blood sugar was normal, but HR was high.  The pt denies any cp or sob.  She denies n/v/d.  She said this has never happened to her.     Past Medical History:  Diagnosis Date  . Acute venous embolism and thrombosis of deep vessels of proximal lower extremity ([place])   . Alcoholic cirrhosis ([place])   . Anemia   . Anxiety   . Arthritis   . Asthma   . Back pain   . Blood transfusion few years ago  . Cellulitis    bilateral lower extremities  . CHF (congestive heart failure) ([place]) 10/13   grade 1 diastolic dysfunction, Nl LVF  . Chronic diastolic congestive heart failure ([place])   . Chronic obstructive pulmonary disease (COPD) ([place])   . Colon polyps   . Depression   . Diabetes mellitus    insulin dep   . Gastroesophageal reflux   . Glaucoma    left  . Gout   . Hepatitis    alcoholic hepatitis  . History of GI diverticular bleed   . Hypertension   . Leg swelling   . On home oxygen therapy oxygen 2 liter per minute prn per [place]  . Pneumonia    history of  . PVD (peripheral vascular disease) ([place]) 2009   bilat iliac stenting  . Reflux esophagitis   . Villous adenoma of colon 05/10/11    Patient Active Problem List   Diagnosis Date Noted  . Gout 08/10/2015  . Hypoglycemia   . Hypothermia   . UTI (lower urinary tract infection)   . Humeral head fracture 06/05/2015  . Idiopathic chronic gout of multiple sites without tophus 06/05/2015  . Chronic renal disease, stage III ([place]) 06/05/2015  . Nausea vomiting and diarrhea 05/09/2015  .  Increased urinary frequency 05/09/2015  . Sepsis ([place]) 05/09/2015  . Hypokalemia   . Chronic diastolic congestive heart failure ([place])   . Hyperlipidemia 02/10/2015  . Cellulitis 11/04/2014  . Essential hypertension 08/27/2014  . Abdominal pain, acute, bilateral lower quadrant 07/26/2014  . Colitis 07/26/2014  . GI bleeding 06/14/2014  . GI bleed 06/14/2014  . Acute encephalopathy 06/14/2014  . Personal history of colonic polyps 08/06/2013  . Knee pain, acute 05/20/2013  . Claudication ([place]) 03/13/2013  . Obesity 03/13/2013  . PVD, LEIA/LIIA and RCIA pta 7/09- ABIs 0.68 and 0.69 Feb 2014 11/17/2012  . PUD (peptic ulcer disease) GI bleed in past (2011) 11/17/2012  . Cirrhosis of liver ([place]) 11/17/2012  . Colon cancer, lap chole 2012 11/17/2012  . 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 Atypical chest pain- no history of CAD. Myoview low risk 2012 11/15/2012  . Controlled diabetes mellitus type 2 with complications ([place]) 23/55/7322  . Acute renal failure superimposed on stage 3 chronic kidney disease ([place]) 06/16/2012  . Incisional hernia 01/09/2012  . Anemia 01/28/2011  . COPD (chronic obstructive pulmonary disease) ([place]) 01/28/2011  . Sleep apnea 01/28/2011  . Transfusion history 01/28/2011  .  Sinus problem 01/28/2011  . Full dentures 01/28/2011    Past Surgical History:  Procedure Laterality Date  . ABDOMINAL HYSTERECTOMY  yrs ago  . CATARACT EXTRACTION Right yrs ago  . COLONOSCOPY    . COLONOSCOPY WITH PROPOFOL N/A 08/06/2013   Procedure: COLONOSCOPY WITH PROPOFOL;  Surgeon: Lear Ng, MD;  Location: [facility];  Service: Endoscopy;  Laterality: N/A;  . ESOPHAGOGASTRODUODENOSCOPY (EGD) WITH PROPOFOL N/A 08/06/2013   Procedure: ESOPHAGOGASTRODUODENOSCOPY (EGD) WITH PROPOFOL;  Surgeon: Lear Ng, MD;  Location: [facility];  Service: Endoscopy;  Laterality: N/A;  . LAPAROSCOPIC ASSISSTED TOTAL COLECTOMY W/ J-POUCH  04/22/11  .  VENTRAL HERNIA REPAIR  06/13/2012   Procedure: LAPAROSCOPIC VENTRAL HERNIA;  Surgeon: Adin Hector, MD;  Location: [facility];  Service: General;  Laterality: N/A;  Laparoscopic Assisted Ventral Hernia with Mesh     OB History   No obstetric history on file.      Home Medications    Prior to Admission medications   Medication Sig Start Date End Date Taking? Authorizing Provider  albuterol (PROVENTIL HFA;VENTOLIN HFA) 108 (90 BASE) MCG/ACT inhaler Inhale 2 puffs into the lungs every 6 (six) hours as needed. For shortness of breath   Yes [provider]  albuterol (PROVENTIL) (2.5 MG/3ML) 0.083% nebulizer solution Take 2.5 mg by nebulization every 6 (six) hours as needed for wheezing or shortness of breath.   Yes [provider]  allopurinol (ZYLOPRIM) 100 MG tablet Take 100 mg by mouth 2 (two) times daily. 01/18/16  Yes [provider]  aspirin 81 MG EC tablet Take 81 mg by mouth daily.     Yes [provider]  cilostazol (PLETAL) 50 MG tablet Take 50 mg by mouth daily.  01/18/16  Yes [provider]  cloNIDine (CATAPRES) 0.1 MG tablet Take 1 tablet (0.1 mg total) by mouth 3 (three) times daily. 09/05/14  Yes [name], MD  diclofenac sodium (VOLTAREN) 1 % GEL Apply 2 g topically 4 (four) times daily.  05/21/18  Yes [provider]  ergocalciferol (VITAMIN D2) 50000 UNITS capsule Take 50,000 Units by mouth every Monday. On Monday   Yes [provider]  ferrous sulfate 325 (65 FE) MG tablet Take 325 mg by mouth daily with breakfast.   Yes [provider]  fluticasone (FLONASE) 50 MCG/ACT nasal spray Place 2 sprays into the nose daily as needed for allergies.    Yes [provider]  folic acid (FOLVITE) 1 MG tablet Take 1 tablet (1 mg total) by mouth daily. 08/18/15  Yes Hongalgi, Lenis Dickinson, MD  furosemide (LASIX) 20 MG tablet Take 1 tablet (20 mg total) by mouth daily. 08/18/15  Yes Hongalgi, Lenis Dickinson, MD    glimepiride (AMARYL) 4 MG tablet Take 4 mg by mouth daily.  03/27/14  Yes [provider]  LANTUS SOLOSTAR 100 UNIT/ML Solostar Pen Inject 30 Units as directed every morning.  01/18/16  Yes [provider]  lisinopril (PRINIVIL,ZESTRIL) 5 MG tablet Take 5 mg by mouth daily. 01/18/16  Yes [provider]  magnesium oxide (MAG-OX) 400 MG tablet Take 400 mg by mouth daily.   Yes [provider]  methocarbamol (ROBAXIN) 500 MG tablet Take 1 tablet (500 mg total) by mouth every 8 (eight) hours as needed for muscle spasms. 02/17/16  Yes Mesner, Corene Cornea, MD  metoprolol (LOPRESSOR) 50 MG tablet Take 50 mg by mouth daily. 01/18/16  Yes [provider]  montelukast (SINGULAIR) 10 MG tablet Take 10  mg by mouth at bedtime.    Yes [provider]  naproxen (NAPROSYN) 375 MG tablet Take 1 tablet (375 mg total) by mouth 2 (two) times daily as needed. 05/06/18  Yes Ward, Ozella Almond, PA-C  omeprazole (PRILOSEC) 40 MG capsule Take 40 mg by mouth daily.    Yes [provider]  OXYGEN-HELIUM IN Inhale 2 L into the lungs at bedtime. 2 liters nightly   Yes [provider]  simvastatin (ZOCOR) 10 MG tablet Take 10 mg by mouth daily.  11/30/14  Yes [provider]  sitaGLIPtin (JANUVIA) 50 MG tablet Take 1 tablet by mouth daily. 03/27/14  Yes [provider]  spironolactone (ALDACTONE) 25 MG tablet Take 0.5 tablets (12.5 mg total) by mouth daily. 08/18/15  Yes Hongalgi, Lenis Dickinson, MD  thiamine 100 MG tablet Take 1 tablet (100 mg total) by mouth daily. 08/18/15  Yes Hongalgi, Lenis Dickinson, MD  tiZANidine (ZANAFLEX) 2 MG tablet Take 2 mg by mouth 2 (two) times daily as needed. 01/04/17  Yes [provider]  traMADol (ULTRAM) 50 MG tablet Take 1 tablet (50 mg total) by mouth every 6 (six) hours as needed. 05/06/18  Yes Ward, Ozella Almond, PA-C  triamcinolone ointment (KENALOG) 0.1 %  05/21/18  Yes [provider]  cephALEXin (KEFLEX) 500  MG capsule Take 1 capsule (500 mg total) by mouth 2 (two) times daily. Patient not taking: Reported on 08/20/2018 11/30/17   Malvin Johns, MD  HYDROcodone-acetaminophen (NORCO/VICODIN) 5-325 MG tablet Take 1-2 tablets by mouth every 6 (six) hours as needed for severe pain. Patient not taking: Reported on 08/20/2018 01/24/17   Little, Wenda Overland, MD    Family History Family History  Problem Relation Age of Onset  . Cancer Brother        heent ca  . Cancer Sister        liver ca    Social History Social History   Tobacco Use  . Smoking status: Current Some Day Smoker    Packs/day: 0.50    Years: 30.00    Pack years: 15.00    Types: Cigarettes    Last attempt to quit: 11/06/2005    Years since quitting: 12.7  . Smokeless tobacco: Never Used  Substance Use Topics  . Alcohol use: No    Comment: stopped 2005  . Drug use: No     Allergies   Compazine [prochlorperazine edisylate]; Penicillins; Phenergan [promethazine hcl]; and Lipitor [atorvastatin]   Review of Systems Review of Systems  Cardiovascular: Positive for palpitations.  Neurological: Positive for weakness.  All other systems reviewed and are negative.    Physical Exam Updated Vital Signs BP (!) 182/90   Pulse 86   Temp 98.8 F (37.1 C) (Oral)   Resp 17   Ht 5' (1.524 m)   Wt 57.2 kg   SpO2 98%   BMI 24.61 kg/m   Physical Exam Vitals signs and nursing note reviewed.  Constitutional:      Appearance: Normal appearance.  HENT:     Head: Normocephalic and atraumatic.     Right Ear: External ear normal.     Left Ear: External ear normal.     Nose: Nose normal.     Mouth/Throat:     Mouth: Mucous membranes are moist.     Pharynx: Oropharynx is clear.  Eyes:     Extraocular Movements: Extraocular movements intact.     Conjunctiva/sclera: Conjunctivae normal.     Pupils: Pupils are equal, round, and  reactive to light.  Neck:     Musculoskeletal: Normal range of motion and neck supple.    Cardiovascular:     Rate and Rhythm: Regular rhythm. Tachycardia present.     Pulses: Normal pulses.     Heart sounds: Normal heart sounds.  Pulmonary:     Effort: Pulmonary effort is normal.     Breath sounds: Normal breath sounds.  Abdominal:     General: Abdomen is flat. Bowel sounds are normal.     Palpations: Abdomen is soft.  Musculoskeletal: Normal range of motion.  Skin:    General: Skin is warm and dry.     Capillary Refill: Capillary refill takes less than 2 seconds.  Neurological:     General: No focal deficit present.     Mental Status: She is alert and oriented to person, place, and time.  Psychiatric:        Mood and Affect: Mood normal.        Behavior: Behavior normal.        Thought Content: Thought content normal.        Judgment: Judgment normal.      ED Treatments / Results  Labs (all labs ordered are listed, but only abnormal results are displayed) Labs Reviewed  COMPREHENSIVE METABOLIC PANEL - Abnormal; Notable for the following components:      Result Value   Potassium 6.1 (*)    Glucose, Bld 220 (*)    BUN 25 (*)    Creatinine, Ser 1.78 (*)    Calcium 8.8 (*)    Total Protein 5.8 (*)    Albumin 1.9 (*)    Alkaline Phosphatase 194 (*)    GFR calc non Af Amer 28 (*)    GFR calc Af Amer 32 (*)    All other components within normal limits  CBC WITH DIFFERENTIAL/PLATELET - Abnormal; Notable for the following components:   WBC 12.7 (*)    RBC 3.73 (*)    Hemoglobin 9.4 (*)    HCT 32.4 (*)    MCH 25.2 (*)    MCHC 29.0 (*)    Neutro Abs 8.9 (*)    Monocytes Absolute 1.3 (*)    All other components within normal limits  URINALYSIS, ROUTINE W REFLEX MICROSCOPIC - Abnormal; Notable for the following components:   Protein, ur 100 (*)    All other components within normal limits  BASIC METABOLIC PANEL - Abnormal; Notable for the following components:   Potassium 5.5 (*)    Glucose, Bld 212 (*)    BUN 24 (*)    Creatinine, Ser 1.57 (*)     Calcium 8.6 (*)    GFR calc non Af Amer 32 (*)    GFR calc Af Amer 37 (*)    All other components within normal limits  D-DIMER, QUANTITATIVE (NOT AT [facility]) - Abnormal; Notable for the following components:   D-Dimer, Quant 2.01 (*)    All other components within normal limits  URINALYSIS, MICROSCOPIC (REFLEX) - Abnormal; Notable for the following components:   Bacteria, UA FEW (*)    All other components within normal limits  CBG MONITORING, ED - Abnormal; Notable for the following components:   Glucose-Capillary 179 (*)    All other components within normal limits  TROPONIN I  POC OCCULT BLOOD, ED    EKG EKG Interpretation  Date/Time:  Monday August 20 2018 16:53:28 EST Ventricular Rate:  134 PR Interval:    QRS Duration: 81 QT Interval:  301  QTC Calculation: 450 R Axis:   19 Text Interpretation:  Sinus tachycardia Low voltage, extremity leads Nonspecific T abnormalities, lateral leads Baseline wander in lead(s) II III aVL aVF Since last tracing rate faster Confirmed by Isla Pence [phone]) on 08/20/2018 6:15:57 PM   Radiology Dg Chest Portable 1 View  Result Date: 08/20/2018 CLINICAL DATA:  Tachycardia. EXAM: PORTABLE CHEST 1 VIEW COMPARISON:  Chest x-ray dated November 30, 2017. FINDINGS: The heart size and mediastinal contours are within normal limits. Chronically coarsened interstitial markings are similar to prior studies. Unchanged bibasilar scarring. No focal consolidation, pleural effusion, or pneumothorax. No acute osseous abnormality. IMPRESSION: Stable chronic changes.  No active disease. Electronically Signed   By: Titus Dubin M.D.   On: 08/20/2018 17:39    Procedures Procedures (including critical care time)  Medications Ordered in ED Medications  iopamidol (ISOVUE-370) 76 % injection (has no administration in time range)  sodium chloride 0.9 % bolus 1,000 mL (0 mLs Intravenous Stopped 08/20/18 2057)  metoprolol tartrate (LOPRESSOR) injection 5 mg (5 mg  Intravenous Given 08/20/18 1900)  sodium chloride 0.9 % bolus 1,000 mL (0 mLs Intravenous Stopped 08/20/18 2306)     Initial Impression / Assessment and Plan / ED Course  I have reviewed the triage vital signs and the nursing notes.  Pertinent labs & imaging results that were available during my care of the patient were reviewed by me and considered in my medical decision making (see chart for details).     Pt is anemic, but is guaiac negative.  MCV is normal.  Likely from CKD.  She has not seen any bloody or black stools.  I think this can be worked up as an outpatient.  She is given the number to GI.   She is no longer tachycardic.  I was considering a CT chest, but she has borderline kidney function and her DDimer is always elevated.  She is not sob and does not have cp.    She is instructed to return if worse.  F/u with pcp.  Final Clinical Impressions(s) / ED Diagnoses   Final diagnoses:  Sinus tachycardia  Dehydration  Anemia, unspecified type    ED Discharge Orders         Ordered    Ambulatory referral to Gastroenterology     08/20/18 2320           Isla Pence, MD 08/20/18 2321

## 2018-08-20 NOTE — ED Notes (Signed)
Pt assisted to  Bathroom by Saralyn Pilar EMT, ambulatory with standby assistance.

## 2018-08-20 NOTE — ED Notes (Signed)
Pt aware that urine sample is needed, but is unable to provide one at this time 

## 2018-08-28 ENCOUNTER — Other Ambulatory Visit: Payer: Self-pay

## 2018-08-28 ENCOUNTER — Emergency Department (HOSPITAL_COMMUNITY): Payer: Medicare Other

## 2018-08-28 ENCOUNTER — Encounter (HOSPITAL_COMMUNITY): Payer: Self-pay

## 2018-08-28 ENCOUNTER — Inpatient Hospital Stay (HOSPITAL_COMMUNITY)
Admission: EM | Admit: 2018-08-28 | Discharge: 2018-09-01 | DRG: 378 | Disposition: A | Discharge status: Home Health | Payer: Medicare Other | Attending: Family Medicine | Admitting: Family Medicine

## 2018-08-28 DIAGNOSIS — N183 Chronic kidney disease, stage 3 (moderate): Secondary | ICD-10-CM | POA: Diagnosis present

## 2018-08-28 DIAGNOSIS — Z7982 Long term (current) use of aspirin: Secondary | ICD-10-CM

## 2018-08-28 DIAGNOSIS — Z808 Family history of malignant neoplasm of other organs or systems: Secondary | ICD-10-CM

## 2018-08-28 DIAGNOSIS — J441 Chronic obstructive pulmonary disease with (acute) exacerbation: Secondary | ICD-10-CM | POA: Diagnosis present

## 2018-08-28 DIAGNOSIS — D649 Anemia, unspecified: Secondary | ICD-10-CM | POA: Diagnosis present

## 2018-08-28 DIAGNOSIS — E872 Acidosis, unspecified: Secondary | ICD-10-CM

## 2018-08-28 DIAGNOSIS — Z800 Family history of malignant neoplasm of digestive organs: Secondary | ICD-10-CM

## 2018-08-28 DIAGNOSIS — Z888 Allergy status to other drugs, medicaments and biological substances status: Secondary | ICD-10-CM

## 2018-08-28 DIAGNOSIS — Z9981 Dependence on supplemental oxygen: Secondary | ICD-10-CM

## 2018-08-28 DIAGNOSIS — K5731 Diverticulosis of large intestine without perforation or abscess with bleeding: Principal | ICD-10-CM | POA: Diagnosis present

## 2018-08-28 DIAGNOSIS — E11649 Type 2 diabetes mellitus with hypoglycemia without coma: Secondary | ICD-10-CM | POA: Diagnosis not present

## 2018-08-28 DIAGNOSIS — Z8711 Personal history of peptic ulcer disease: Secondary | ICD-10-CM

## 2018-08-28 DIAGNOSIS — Z791 Long term (current) use of non-steroidal anti-inflammatories (NSAID): Secondary | ICD-10-CM

## 2018-08-28 DIAGNOSIS — Z9049 Acquired absence of other specified parts of digestive tract: Secondary | ICD-10-CM

## 2018-08-28 DIAGNOSIS — Z880 Allergy status to penicillin: Secondary | ICD-10-CM

## 2018-08-28 DIAGNOSIS — E1122 Type 2 diabetes mellitus with diabetic chronic kidney disease: Secondary | ICD-10-CM | POA: Diagnosis present

## 2018-08-28 DIAGNOSIS — K922 Gastrointestinal hemorrhage, unspecified: Secondary | ICD-10-CM

## 2018-08-28 DIAGNOSIS — Z79899 Other long term (current) drug therapy: Secondary | ICD-10-CM

## 2018-08-28 DIAGNOSIS — H409 Unspecified glaucoma: Secondary | ICD-10-CM | POA: Diagnosis present

## 2018-08-28 DIAGNOSIS — R079 Chest pain, unspecified: Secondary | ICD-10-CM

## 2018-08-28 DIAGNOSIS — I130 Hypertensive heart and chronic kidney disease with heart failure and stage 1 through stage 4 chronic kidney disease, or unspecified chronic kidney disease: Secondary | ICD-10-CM | POA: Diagnosis present

## 2018-08-28 DIAGNOSIS — I5032 Chronic diastolic (congestive) heart failure: Secondary | ICD-10-CM | POA: Diagnosis present

## 2018-08-28 DIAGNOSIS — E118 Type 2 diabetes mellitus with unspecified complications: Secondary | ICD-10-CM | POA: Diagnosis present

## 2018-08-28 DIAGNOSIS — K7010 Alcoholic hepatitis without ascites: Secondary | ICD-10-CM | POA: Diagnosis present

## 2018-08-28 DIAGNOSIS — K644 Residual hemorrhoidal skin tags: Secondary | ICD-10-CM | POA: Diagnosis present

## 2018-08-28 DIAGNOSIS — K635 Polyp of colon: Secondary | ICD-10-CM | POA: Diagnosis present

## 2018-08-28 DIAGNOSIS — F17210 Nicotine dependence, cigarettes, uncomplicated: Secondary | ICD-10-CM | POA: Diagnosis present

## 2018-08-28 DIAGNOSIS — Z86718 Personal history of other venous thrombosis and embolism: Secondary | ICD-10-CM

## 2018-08-28 DIAGNOSIS — E875 Hyperkalemia: Secondary | ICD-10-CM | POA: Diagnosis present

## 2018-08-28 DIAGNOSIS — N179 Acute kidney failure, unspecified: Secondary | ICD-10-CM | POA: Diagnosis present

## 2018-08-28 DIAGNOSIS — Z90710 Acquired absence of both cervix and uterus: Secondary | ICD-10-CM

## 2018-08-28 DIAGNOSIS — I10 Essential (primary) hypertension: Secondary | ICD-10-CM | POA: Diagnosis present

## 2018-08-28 DIAGNOSIS — J449 Chronic obstructive pulmonary disease, unspecified: Secondary | ICD-10-CM | POA: Diagnosis present

## 2018-08-28 DIAGNOSIS — K625 Hemorrhage of anus and rectum: Secondary | ICD-10-CM | POA: Diagnosis not present

## 2018-08-28 DIAGNOSIS — H539 Unspecified visual disturbance: Secondary | ICD-10-CM | POA: Diagnosis present

## 2018-08-28 DIAGNOSIS — K7030 Alcoholic cirrhosis of liver without ascites: Secondary | ICD-10-CM | POA: Diagnosis present

## 2018-08-28 DIAGNOSIS — K279 Peptic ulcer, site unspecified, unspecified as acute or chronic, without hemorrhage or perforation: Secondary | ICD-10-CM | POA: Diagnosis present

## 2018-08-28 DIAGNOSIS — Z7951 Long term (current) use of inhaled steroids: Secondary | ICD-10-CM

## 2018-08-28 DIAGNOSIS — Z79891 Long term (current) use of opiate analgesic: Secondary | ICD-10-CM

## 2018-08-28 DIAGNOSIS — E785 Hyperlipidemia, unspecified: Secondary | ICD-10-CM | POA: Diagnosis present

## 2018-08-28 DIAGNOSIS — Z794 Long term (current) use of insulin: Secondary | ICD-10-CM

## 2018-08-28 DIAGNOSIS — Z85038 Personal history of other malignant neoplasm of large intestine: Secondary | ICD-10-CM

## 2018-08-28 DIAGNOSIS — Z8701 Personal history of pneumonia (recurrent): Secondary | ICD-10-CM

## 2018-08-28 DIAGNOSIS — I739 Peripheral vascular disease, unspecified: Secondary | ICD-10-CM | POA: Diagnosis present

## 2018-08-28 DIAGNOSIS — K648 Other hemorrhoids: Secondary | ICD-10-CM | POA: Diagnosis present

## 2018-08-28 DIAGNOSIS — Z86010 Personal history of colonic polyps: Secondary | ICD-10-CM

## 2018-08-28 DIAGNOSIS — K861 Other chronic pancreatitis: Secondary | ICD-10-CM | POA: Diagnosis present

## 2018-08-28 DIAGNOSIS — K449 Diaphragmatic hernia without obstruction or gangrene: Secondary | ICD-10-CM | POA: Diagnosis present

## 2018-08-28 DIAGNOSIS — D62 Acute posthemorrhagic anemia: Secondary | ICD-10-CM | POA: Diagnosis present

## 2018-08-28 DIAGNOSIS — G4730 Sleep apnea, unspecified: Secondary | ICD-10-CM | POA: Diagnosis present

## 2018-08-28 DIAGNOSIS — F1021 Alcohol dependence, in remission: Secondary | ICD-10-CM | POA: Diagnosis present

## 2018-08-28 DIAGNOSIS — Z9841 Cataract extraction status, right eye: Secondary | ICD-10-CM

## 2018-08-28 DIAGNOSIS — K7460 Unspecified cirrhosis of liver: Secondary | ICD-10-CM | POA: Diagnosis present

## 2018-08-28 DIAGNOSIS — D631 Anemia in chronic kidney disease: Secondary | ICD-10-CM | POA: Diagnosis present

## 2018-08-28 DIAGNOSIS — Z8719 Personal history of other diseases of the digestive system: Secondary | ICD-10-CM

## 2018-08-28 DIAGNOSIS — K210 Gastro-esophageal reflux disease with esophagitis: Secondary | ICD-10-CM | POA: Diagnosis present

## 2018-08-28 DIAGNOSIS — E1151 Type 2 diabetes mellitus with diabetic peripheral angiopathy without gangrene: Secondary | ICD-10-CM | POA: Diagnosis present

## 2018-08-28 DIAGNOSIS — M109 Gout, unspecified: Secondary | ICD-10-CM | POA: Diagnosis present

## 2018-08-28 DIAGNOSIS — Z980 Intestinal bypass and anastomosis status: Secondary | ICD-10-CM

## 2018-08-28 HISTORY — DX: Gastrointestinal hemorrhage, unspecified: K92.2

## 2018-08-28 LAB — URINALYSIS, ROUTINE W REFLEX MICROSCOPIC
Bilirubin Urine: NEGATIVE
Glucose, UA: >=500 mg/dL — AB
Ketones, ur: NEGATIVE mg/dL
Leukocytes, UA: NEGATIVE
Nitrite: NEGATIVE
Protein, ur: 100 mg/dL — AB
SPECIFIC GRAVITY, URINE: 1.007 (ref 1.005–1.030)
pH: 6 (ref 5.0–8.0)

## 2018-08-28 LAB — CBG MONITORING, ED: Glucose-Capillary: 397 mg/dL — ABNORMAL HIGH (ref 70–99)

## 2018-08-28 LAB — SEDIMENTATION RATE: Sed Rate: 80 mm/hr — ABNORMAL HIGH (ref 0–22)

## 2018-08-28 LAB — COMPREHENSIVE METABOLIC PANEL
ALT: 11 U/L (ref 0–44)
AST: 27 U/L (ref 15–41)
Albumin: 2.2 g/dL — ABNORMAL LOW (ref 3.5–5.0)
Alkaline Phosphatase: 207 U/L — ABNORMAL HIGH (ref 38–126)
Anion gap: 9 (ref 5–15)
BUN: 31 mg/dL — ABNORMAL HIGH (ref 8–23)
CO2: 24 mmol/L (ref 22–32)
Calcium: 9 mg/dL (ref 8.9–10.3)
Chloride: 101 mmol/L (ref 98–111)
Creatinine, Ser: 1.83 mg/dL — ABNORMAL HIGH (ref 0.44–1.00)
GFR calc Af Amer: 31 mL/min — ABNORMAL LOW (ref >60)
GFR calc non Af Amer: 27 mL/min — ABNORMAL LOW (ref >60)
GLUCOSE: 416 mg/dL — AB (ref 70–99)
Potassium: 5.5 mmol/L — ABNORMAL HIGH (ref 3.5–5.1)
Sodium: 134 mmol/L — ABNORMAL LOW (ref 135–145)
Total Bilirubin: 0.4 mg/dL (ref 0.3–1.2)
Total Protein: 6.4 g/dL — ABNORMAL LOW (ref 6.5–8.1)

## 2018-08-28 LAB — CBC WITH DIFFERENTIAL/PLATELET
ABS IMMATURE GRANULOCYTES: 0.07 10*3/uL (ref 0.00–0.07)
Abs Immature Granulocytes: 0.05 10*3/uL (ref 0.00–0.07)
Basophils Absolute: 0 10*3/uL (ref 0.0–0.1)
Basophils Absolute: 0 10*3/uL (ref 0.0–0.1)
Basophils Relative: 0 %
Basophils Relative: 0 %
Eosinophils Absolute: 0.1 10*3/uL (ref 0.0–0.5)
Eosinophils Absolute: 0 10*3/uL (ref 0.0–0.5)
Eosinophils Relative: 0 %
Eosinophils Relative: 0 %
HCT: 29 % — ABNORMAL LOW (ref 36.0–46.0)
HCT: 20.5 % — ABNORMAL LOW (ref 36.0–46.0)
HEMOGLOBIN: 8.5 g/dL — AB (ref 12.0–15.0)
Hemoglobin: 6.1 g/dL — CL (ref 12.0–15.0)
IMMATURE GRANULOCYTES: 1 %
Immature Granulocytes: 1 %
Lymphocytes Relative: 14 %
Lymphocytes Relative: 21 %
Lymphs Abs: 1.9 10*3/uL (ref 0.7–4.0)
Lymphs Abs: 2.2 10*3/uL (ref 0.7–4.0)
MCH: 25.4 pg — ABNORMAL LOW (ref 26.0–34.0)
MCH: 25.8 pg — ABNORMAL LOW (ref 26.0–34.0)
MCHC: 29.3 g/dL — ABNORMAL LOW (ref 30.0–36.0)
MCHC: 29.8 g/dL — ABNORMAL LOW (ref 30.0–36.0)
MCV: 86.8 fL (ref 80.0–100.0)
MCV: 86.9 fL (ref 80.0–100.0)
MONOS PCT: 6 %
Monocytes Absolute: 0.9 10*3/uL (ref 0.1–1.0)
Monocytes Absolute: 0.9 10*3/uL (ref 0.1–1.0)
Monocytes Relative: 9 %
NEUTROS ABS: 10.5 10*3/uL — AB (ref 1.7–7.7)
Neutro Abs: 7.3 10*3/uL (ref 1.7–7.7)
Neutrophils Relative %: 79 %
Neutrophils Relative %: 69 %
Platelets: 341 10*3/uL (ref 150–400)
Platelets: 270 10*3/uL (ref 150–400)
RBC: 3.34 MIL/uL — ABNORMAL LOW (ref 3.87–5.11)
RBC: 2.36 MIL/uL — ABNORMAL LOW (ref 3.87–5.11)
RDW: 15.7 % — ABNORMAL HIGH (ref 11.5–15.5)
RDW: 15.7 % — ABNORMAL HIGH (ref 11.5–15.5)
WBC: 13.4 10*3/uL — ABNORMAL HIGH (ref 4.0–10.5)
WBC: 10.5 10*3/uL (ref 4.0–10.5)
nRBC: 0 % (ref 0.0–0.2)
nRBC: 0 % (ref 0.0–0.2)

## 2018-08-28 LAB — I-STAT CHEM 8, ED
BUN: 36 mg/dL — ABNORMAL HIGH (ref 8–23)
Calcium, Ion: 1.19 mmol/L (ref 1.15–1.40)
Chloride: 103 mmol/L (ref 98–111)
Creatinine, Ser: 1.7 mg/dL — ABNORMAL HIGH (ref 0.44–1.00)
Glucose, Bld: 428 mg/dL — ABNORMAL HIGH (ref 70–99)
HCT: 29 % — ABNORMAL LOW (ref 36.0–46.0)
Hemoglobin: 9.9 g/dL — ABNORMAL LOW (ref 12.0–15.0)
POTASSIUM: 5.5 mmol/L — AB (ref 3.5–5.1)
Sodium: 133 mmol/L — ABNORMAL LOW (ref 135–145)
TCO2: 25 mmol/L (ref 22–32)

## 2018-08-28 LAB — GLUCOSE, CAPILLARY: Glucose-Capillary: 203 mg/dL — ABNORMAL HIGH (ref 70–99)

## 2018-08-28 LAB — I-STAT CG4 LACTIC ACID, ED: Lactic Acid, Venous: 2.21 mmol/L (ref 0.5–1.9)

## 2018-08-28 LAB — PREPARE RBC (CROSSMATCH)

## 2018-08-28 LAB — I-STAT TROPONIN, ED: TROPONIN I, POC: 0.02 ng/mL (ref 0.00–0.08)

## 2018-08-28 LAB — PROTIME-INR
INR: 1.1
Prothrombin Time: 14.1 seconds (ref 11.4–15.2)

## 2018-08-28 LAB — POC OCCULT BLOOD, ED: FECAL OCCULT BLD: POSITIVE — AB

## 2018-08-28 MED ORDER — POLYETHYLENE GLYCOL 3350 17 G PO PACK: 17.0000 g | PACK | Freq: Every day | ORAL | Status: DC | PRN

## 2018-08-28 MED ORDER — INSULIN ASPART 100 UNIT/ML ~~LOC~~ SOLN
0.0000 [IU] | Freq: Every day | SUBCUTANEOUS | Status: DC
  Administered 2018-08-28: 2 [IU] via SUBCUTANEOUS

## 2018-08-28 MED ORDER — SODIUM CHLORIDE 0.9 % IV BOLUS
1000.0000 mL | Freq: Once | INTRAVENOUS | Status: AC
  Administered 2018-08-28: 1000 mL via INTRAVENOUS

## 2018-08-28 MED ORDER — HYDROCODONE-ACETAMINOPHEN 5-325 MG PO TABS: 1.0000 | ORAL_TABLET | Freq: Four times a day (QID) | ORAL | Status: DC | PRN

## 2018-08-28 MED ORDER — VITAMIN B-1 100 MG PO TABS
100.0000 mg | ORAL_TABLET | Freq: Every day | ORAL | Status: DC
  Administered 2018-08-29 – 2018-09-01 (×3): 100 mg via ORAL
  Filled 2018-08-28 (×4): qty 1

## 2018-08-28 MED ORDER — METHOCARBAMOL 500 MG PO TABS: 500.0000 mg | ORAL_TABLET | Freq: Three times a day (TID) | ORAL | Status: DC | PRN

## 2018-08-28 MED ORDER — ALLOPURINOL 100 MG PO TABS
100.0000 mg | ORAL_TABLET | Freq: Every day | ORAL | Status: DC
  Administered 2018-08-29 – 2018-09-01 (×3): 100 mg via ORAL
  Filled 2018-08-28 (×4): qty 1

## 2018-08-28 MED ORDER — SODIUM CHLORIDE 0.9% IV SOLUTION
Freq: Once | INTRAVENOUS | Status: AC
  Administered 2018-08-29: 02:00:00 via INTRAVENOUS

## 2018-08-28 MED ORDER — SODIUM CHLORIDE 0.9 % IV BOLUS
500.0000 mL | Freq: Once | INTRAVENOUS | Status: AC
  Administered 2018-08-28: 500 mL via INTRAVENOUS

## 2018-08-28 MED ORDER — SODIUM CHLORIDE 0.9 % IV SOLN
INTRAVENOUS | Status: DC
  Administered 2018-08-28 – 2018-08-30 (×5): via INTRAVENOUS

## 2018-08-28 MED ORDER — ACETAMINOPHEN 650 MG RE SUPP: 650.0000 mg | Freq: Four times a day (QID) | RECTAL | Status: DC | PRN

## 2018-08-28 MED ORDER — METOPROLOL TARTRATE 25 MG PO TABS: 50.0000 mg | ORAL_TABLET | Freq: Two times a day (BID) | ORAL | Status: DC

## 2018-08-28 MED ORDER — FOLIC ACID 1 MG PO TABS
1.0000 mg | ORAL_TABLET | Freq: Every day | ORAL | Status: DC
  Administered 2018-08-29 – 2018-09-01 (×3): 1 mg via ORAL
  Filled 2018-08-28 (×4): qty 1

## 2018-08-28 MED ORDER — HYDRALAZINE HCL 20 MG/ML IJ SOLN
5.0000 mg | Freq: Four times a day (QID) | INTRAMUSCULAR | Status: DC | PRN
  Administered 2018-08-29 – 2018-08-30 (×2): 5 mg via INTRAVENOUS
  Filled 2018-08-28 (×2): qty 1

## 2018-08-28 MED ORDER — SODIUM CHLORIDE 0.9 % IV SOLN: 250.0000 mL | INTRAVENOUS | Status: DC | PRN

## 2018-08-28 MED ORDER — SODIUM CHLORIDE 0.9 % IV SOLN
INTRAVENOUS | Status: DC
  Administered 2018-08-28: 15:00:00 via INTRAVENOUS

## 2018-08-28 MED ORDER — SODIUM CHLORIDE 0.9 % IV SOLN
8.0000 mg/h | INTRAVENOUS | Status: DC
  Administered 2018-08-28 – 2018-08-30 (×5): 8 mg/h via INTRAVENOUS
  Filled 2018-08-28 (×6): qty 80

## 2018-08-28 MED ORDER — SODIUM CHLORIDE 0.9% FLUSH
3.0000 mL | Freq: Two times a day (BID) | INTRAVENOUS | Status: DC
  Administered 2018-08-28 – 2018-09-01 (×4): 3 mL via INTRAVENOUS

## 2018-08-28 MED ORDER — ONDANSETRON HCL 4 MG/2ML IJ SOLN
4.0000 mg | Freq: Four times a day (QID) | INTRAMUSCULAR | Status: DC | PRN
  Administered 2018-08-30: 4 mg via INTRAVENOUS
  Filled 2018-08-28: qty 2

## 2018-08-28 MED ORDER — INSULIN GLARGINE 100 UNIT/ML ~~LOC~~ SOLN
15.0000 [IU] | Freq: Every day | SUBCUTANEOUS | Status: DC
  Administered 2018-08-28 – 2018-08-30 (×3): 15 [IU] via SUBCUTANEOUS
  Filled 2018-08-28 (×3): qty 0.15

## 2018-08-28 MED ORDER — TETRACAINE HCL 0.5 % OP SOLN
1.0000 [drp] | Freq: Once | OPHTHALMIC | Status: AC
  Administered 2018-08-28: 1 [drp] via OPHTHALMIC
  Filled 2018-08-28: qty 4

## 2018-08-28 MED ORDER — SODIUM CHLORIDE 0.9% FLUSH: 3.0000 mL | INTRAVENOUS | Status: DC | PRN

## 2018-08-28 MED ORDER — ACETAMINOPHEN 325 MG PO TABS
650.0000 mg | ORAL_TABLET | Freq: Four times a day (QID) | ORAL | Status: DC | PRN
  Administered 2018-08-29: 650 mg via ORAL
  Filled 2018-08-28: qty 2

## 2018-08-28 MED ORDER — TRAZODONE HCL 50 MG PO TABS
50.0000 mg | ORAL_TABLET | Freq: Every evening | ORAL | Status: DC | PRN
  Administered 2018-08-28: 50 mg via ORAL
  Filled 2018-08-28: qty 1

## 2018-08-28 MED ORDER — INSULIN ASPART 100 UNIT/ML ~~LOC~~ SOLN
0.0000 [IU] | Freq: Three times a day (TID) | SUBCUTANEOUS | Status: DC
  Administered 2018-08-28 – 2018-08-29 (×2): 3 [IU] via SUBCUTANEOUS
  Administered 2018-08-30: 5 [IU] via SUBCUTANEOUS

## 2018-08-28 MED ORDER — FUROSEMIDE 10 MG/ML IJ SOLN
40.0000 mg | Freq: Once | INTRAMUSCULAR | Status: AC
  Administered 2018-08-28: 40 mg via INTRAVENOUS
  Filled 2018-08-28: qty 4

## 2018-08-28 MED ORDER — ALBUTEROL SULFATE (2.5 MG/3ML) 0.083% IN NEBU: 2.5000 mg | INHALATION_SOLUTION | RESPIRATORY_TRACT | Status: DC | PRN

## 2018-08-28 MED ORDER — SODIUM CHLORIDE 0.9 % IV SOLN
80.0000 mg | Freq: Once | INTRAVENOUS | Status: AC
  Administered 2018-08-28: 80 mg via INTRAVENOUS
  Filled 2018-08-28: qty 80

## 2018-08-28 MED ORDER — MAGNESIUM OXIDE 400 (241.3 MG) MG PO TABS
400.0000 mg | ORAL_TABLET | Freq: Every day | ORAL | Status: DC
  Administered 2018-08-29 – 2018-09-01 (×2): 400 mg via ORAL
  Filled 2018-08-28 (×4): qty 1

## 2018-08-28 MED ORDER — METOPROLOL TARTRATE 25 MG PO TABS
25.0000 mg | ORAL_TABLET | Freq: Two times a day (BID) | ORAL | Status: DC
  Administered 2018-08-28 – 2018-09-01 (×7): 25 mg via ORAL
  Filled 2018-08-28 (×8): qty 1

## 2018-08-28 MED ORDER — ONDANSETRON HCL 4 MG PO TABS: 4.0000 mg | ORAL_TABLET | Freq: Four times a day (QID) | ORAL | Status: DC | PRN

## 2018-08-28 MED ORDER — SIMVASTATIN 20 MG PO TABS
10.0000 mg | ORAL_TABLET | Freq: Every day | ORAL | Status: DC
  Administered 2018-08-29 – 2018-09-01 (×3): 10 mg via ORAL
  Filled 2018-08-28 (×4): qty 1

## 2018-08-28 NOTE — ED Notes (Signed)
Pt was unable to cover R eye for visual acuity. Stated she couldn't see.

## 2018-08-28 NOTE — Progress Notes (Signed)
[name] [id number] Admission Data: 08/28/2018 6:55 PM Attending Provider: Roxan Hockey, MD  [id number], Christean Grief, MD Consults/ Treatment Team:   [name] is a 72 y.o. female patient admitted from ED awake, alert  & orientated  X 3,  Full Code, VSS - Blood pressure (!) 165/87, pulse (!) 104, temperature 98.5 F (36.9 C), temperature source Oral, resp. rate (!) 27, SpO2 93 %., O2   On RA, no c/o shortness of breath, no c/o chest pain, no distress noted. Tele # 13 placed and pt is currently running:normal sinus rhythm.   IV site WDL:  hand right, condition patent and no redness with a transparent dsg that's clean dry and intact.  Allergies:   Allergies  Allergen Reactions  . Compazine [Prochlorperazine Edisylate] Other (See Comments)    Altered mental status  . Penicillins Hives    Has patient had a PCN reaction causing immediate rash, facial/tongue/throat swelling, SOB or lightheadedness with hypotension: No Has patient had a PCN reaction causing severe rash involving mucus membranes or skin necrosis: No Has patient had a PCN reaction that required hospitalization No Has patient had a PCN reaction occurring within the last 10 years: No If all of the above answers are "NO", then may proceed with Cephalosporin use.   Marland Kitchen Phenergan [Promethazine Hcl] Other (See Comments)    Altered mental status  . Lipitor [Atorvastatin] Swelling     Past Medical History:  Diagnosis Date  . Acute venous embolism and thrombosis of deep vessels of proximal lower extremity ([place])   . Alcoholic cirrhosis ([place])   . Anemia   . Anxiety   . Arthritis   . Asthma   . Back pain   . Blood transfusion few years ago  . Cellulitis    bilateral lower extremities  . CHF (congestive heart failure) ([place]) 10/13   grade 1 diastolic dysfunction, Nl LVF  . Chronic diastolic congestive heart failure ([place])   . Chronic obstructive pulmonary disease (COPD) ([place])   . Colon polyps   . Depression   .  Diabetes mellitus    insulin dep   . Gastroesophageal reflux   . Glaucoma    left  . Gout   . Hepatitis    alcoholic hepatitis  . History of GI diverticular bleed   . Hypertension   . Leg swelling   . On home oxygen therapy oxygen 2 liter per minute prn per   . Pneumonia    history of  . PVD (peripheral vascular disease) ([place]) 2009   bilat iliac stenting  . Reflux esophagitis   . Villous adenoma of colon 05/10/11    Pt orientation to unit, room and routine. Information packet given to patient/family and safety video watched.  Admission INP armband ID verified with patient/family, and in place. SR up x 2, fall risk assessment complete with Patient and family verbalizing understanding of risks associated with falls. Pt verbalizes an understanding of how to use the call bell and to call for help before getting out of bed.  Skin, clean-dry- intact without evidence of bruising, or skin tears.   No evidence of skin break down noted on exam.  Will cont to monitor and assist as needed.  Hiram Comber, RN 08/28/2018 6:55 PM

## 2018-08-28 NOTE — H&P (Addendum)
Patient Demographics:    [name], is a 72 y.o. female  MRN: [id number]   DOB - 03-26-1946  Admit Date - 08/28/2018  Outpatient Primary MD for the patient is Nolene Ebbs, MD   Assessment & Plan:    Principal Problem:   GI bleed Active Problems:   Anemia   COPD (chronic obstructive pulmonary disease) ([place])   Sleep apnea   Controlled diabetes mellitus type 2 with complications ([place])   Acute renal failure superimposed on stage 3 chronic kidney disease (HCC)   PVD, LEIA/LIIA and RCIA pta 7/09- ABIs 0.68 and 0.69 Feb 2014   PUD (peptic ulcer disease) GI bleed in past (2011)   Cirrhosis of liver ([place])   Essential hypertension   AKI (acute kidney injury) ([place])    1)Acute GI bleed--- hemoglobin down to 6.1 today was 9.4 on 08/20/2018, patient reports bloody stools , fatigue and dizziness--check serial H&H and transfuse as clinically indicated, stool occult blood is obviously positive give Protonix, patient with history of peptic ulcer disease and history of prior diverticular bleed, ???  If esophageal varices given history of alcoholic liver cirrhosis,    [facility] to see, Dr. Alessandra Bevels requested the patient be kept n.p.o. overnight, official [facility] consult pending... Hold naproxen,, hold aspirin and Pletal,  Risk, benefits and alternatives to transfusion of blood products discussed. Indication for transfusion discussed. Consent obtained Please Transfuse 2 units of packed red blood cells,  give Lasix 40 mg IV x1 after first unit of packed cells is infused   2)AKI with hyperkalemia----acute kidney injury on CKD stage - III , creatinine on admission=1.83  ,   baseline creatinine = 1.5 to 1.6   , renally adjust medications, avoid nephrotoxic agents/dehydration/hypotension , hold lisinopril, recheck BMP after  hydration if potassium remains high may give Kayexalate at that time   3)DM2-no recent A1c, decrease Lantus to 15 units from 30 units as patient will be on clear liquids and then possibly n.p.o. for endoluminal evaluation, okay to use sliding scale.  Hold Amaryl and Januvia  4)HFpEF--- last known EF 55 to 60%, clinically patient appears somewhat dry, hydrate gently, watch carefully for possible volume overload with IV hydration.. Change metoprolol to 25 mg p.o. twice daily  5) acute on chronic anemia--- secondary to #1 above with acute blood loss from GI source-treat as above #1  6)Alcoholic liver disease/Cirrhosis --- INR is 1.1, avoid hepatotoxic agents, last EGD in 2014 without esophageal varices  7)PAD with bilateral iliac stents--- she is a reformed smoker, aspirin and Pletal on hold due to #1 above.... Resume when okay with GI  With History of - Reviewed by me  Past Medical History:  Diagnosis Date  . Acute venous embolism and thrombosis of deep vessels of proximal lower extremity ([place])   . Alcoholic cirrhosis ([place])   . Anemia   . Anxiety   . Arthritis   . Asthma   . Back pain   .  Blood transfusion few years ago  . Cellulitis    bilateral lower extremities  . CHF (congestive heart failure) ([place]) 10/13   grade 1 diastolic dysfunction, Nl LVF  . Chronic diastolic congestive heart failure ([place])   . Chronic obstructive pulmonary disease (COPD) ([place])   . Colon polyps   . Depression   . Diabetes mellitus    insulin dep   . Gastroesophageal reflux   . Glaucoma    left  . Gout   . Hepatitis    alcoholic hepatitis  . History of GI diverticular bleed   . Hypertension   . Leg swelling   . On home oxygen therapy oxygen 2 liter per minute prn per [place]  . Pneumonia    history of  . PVD (peripheral vascular disease) ([place]) 2009   bilat iliac stenting  . Reflux esophagitis   . Villous adenoma of colon 05/10/11      Past Surgical History:  Procedure Laterality Date  .  ABDOMINAL HYSTERECTOMY  yrs ago  . CATARACT EXTRACTION Right yrs ago  . COLONOSCOPY    . COLONOSCOPY WITH PROPOFOL N/A 08/06/2013   Procedure: COLONOSCOPY WITH PROPOFOL;  Surgeon: Lear Ng, MD;  Location: [facility];  Service: Endoscopy;  Laterality: N/A;  . ESOPHAGOGASTRODUODENOSCOPY (EGD) WITH PROPOFOL N/A 08/06/2013   Procedure: ESOPHAGOGASTRODUODENOSCOPY (EGD) WITH PROPOFOL;  Surgeon: Lear Ng, MD;  Location: [facility];  Service: Endoscopy;  Laterality: N/A;  . LAPAROSCOPIC ASSISSTED TOTAL COLECTOMY W/ J-POUCH  04/22/11  . VENTRAL HERNIA REPAIR  06/13/2012   Procedure: LAPAROSCOPIC VENTRAL HERNIA;  Surgeon: Adin Hector, MD;  Location: [facility];  Service: General;  Laterality: N/A;  Laparoscopic Assisted Ventral Hernia with Mesh      No chief complaint on file.     HPI:    [name]  is a 72 y.o. female with past medical history relevant for peptic ulcer disease, with prior history of diverticular bleed as well, chronic anemia, reformed alcoholic (last Etoh 2 yrs ago), reformed smoker, CKD III, PAD, alcoholic liver disease, , h/o HFpEF presents to the ED with fatigue, dizziness and concerns about blood with stools...  Patient denies frank hematemesis, denies hematochezia-----she tells me her stools are like liver, and at times she has bright red blood in it.... No abdominal pain no flank pain no dysuria no fevers no chills no frank emesis.... She does have nausea and poor appetite  In ED--- patient is found to have hemoglobin of 6.1 down from 9.4 on 08/20/2018, with Hemoccult-positive stools  She is lethargic and a poor historian  , lactic acid is up to 2.2----.. potassium elevated at 5.5, troponin  0.02   Previous CBC sample hemolyzed, repeat CBC shows hemoglobin of 6.1 with a hematocrit of 20      Review of systems:    In addition to the HPI above,   A full Review of  Systems was done, all other systems reviewed are negative except as noted  above in HPI , .    Social History:  Reviewed by me    Social History   Tobacco Use  . Smoking status: Current Some Day Smoker    Packs/day: 0.50    Years: 30.00    Pack years: 15.00    Types: Cigarettes    Last attempt to quit: 11/06/2005    Years since quitting: 12.8  . Smokeless tobacco: Never Used  Substance Use Topics  . Alcohol use: No    Comment: stopped 2005  Family History :  Reviewed by me    Family History  Problem Relation Age of Onset  . Cancer Brother        heent ca  . Cancer Sister        liver ca     Home Medications:   Prior to Admission medications   Medication Sig Start Date End Date Taking? Authorizing Provider  albuterol (PROVENTIL HFA;VENTOLIN HFA) 108 (90 BASE) MCG/ACT inhaler Inhale 2 puffs into the lungs every 6 (six) hours as needed. For shortness of breath   Yes [provider]  albuterol (PROVENTIL) (2.5 MG/3ML) 0.083% nebulizer solution Take 2.5 mg by nebulization every 6 (six) hours as needed for wheezing or shortness of breath.   Yes [provider]  allopurinol (ZYLOPRIM) 100 MG tablet Take 100 mg by mouth daily.  01/18/16  Yes [provider]  cilostazol (PLETAL) 50 MG tablet Take 50 mg by mouth 2 (two) times daily.  01/18/16  Yes [provider]  diclofenac sodium (VOLTAREN) 1 % GEL Apply 2 g topically 4 (four) times daily.  05/21/18  Yes [provider]  ergocalciferol (VITAMIN D2) 50000 UNITS capsule Take 50,000 Units by mouth every Monday. On Monday   Yes [provider]  fluticasone (FLONASE) 50 MCG/ACT nasal spray Place 2 sprays into the nose daily as needed for allergies.    Yes [provider]  LANTUS SOLOSTAR 100 UNIT/ML Solostar Pen Inject 30 Units as directed every morning.  01/18/16  Yes [provider]  metoprolol (LOPRESSOR) 50 MG tablet Take 50 mg by mouth daily. 01/18/16  Yes [provider]  montelukast (SINGULAIR) 10 MG tablet Take 10 mg  by mouth at bedtime.    Yes [provider]  naproxen (NAPROSYN) 375 MG tablet Take 1 tablet (375 mg total) by mouth 2 (two) times daily as needed. 05/06/18  Yes Ward, Ozella Almond, PA-C  omeprazole (PRILOSEC) 40 MG capsule Take 40 mg by mouth daily.    Yes [provider]  simvastatin (ZOCOR) 10 MG tablet Take 10 mg by mouth daily.  11/30/14  Yes [provider]  sitaGLIPtin (JANUVIA) 100 MG tablet Take 100 mg by mouth daily.  03/27/14  Yes [provider]  tiZANidine (ZANAFLEX) 2 MG tablet Take 2 mg by mouth 2 (two) times daily as needed. 01/04/17  Yes [provider]  traMADol (ULTRAM) 50 MG tablet Take 1 tablet (50 mg total) by mouth every 6 (six) hours as needed. 05/06/18  Yes Ward, Ozella Almond, PA-C  aspirin 81 MG EC tablet Take 81 mg by mouth daily.      [provider]  ferrous sulfate 325 (65 FE) MG tablet Take 325 mg by mouth daily with breakfast.    [provider]  folic acid (FOLVITE) 1 MG tablet Take 1 tablet (1 mg total) by mouth daily. 08/18/15   Hongalgi, Lenis Dickinson, MD  furosemide (LASIX) 20 MG tablet Take 1 tablet (20 mg total) by mouth daily. 08/18/15   Hongalgi, Lenis Dickinson, MD  glimepiride (AMARYL) 4 MG tablet Take 4 mg by mouth daily.  03/27/14   [provider]  HYDROcodone-acetaminophen (NORCO/VICODIN) 5-325 MG tablet Take 1-2 tablets by mouth every 6 (six) hours as needed for severe pain. Patient not taking: Reported on 08/20/2018 01/24/17   Little, Wenda Overland, MD  lisinopril (PRINIVIL,ZESTRIL) 5 MG tablet Take 5 mg by mouth daily. 01/18/16   [provider]  magnesium oxide (MAG-OX) 400 MG tablet Take 400  mg by mouth daily.    [provider]  methocarbamol (ROBAXIN) 500 MG tablet Take 1 tablet (500 mg total) by mouth every 8 (eight) hours as needed for muscle spasms. 02/17/16   Mesner, Corene Cornea, MD  OXYGEN-HELIUM IN Inhale 2 L into the lungs at bedtime. 2 liters nightly    [provider]    thiamine 100 MG tablet Take 1 tablet (100 mg total) by mouth daily. 08/18/15   Modena Jansky, MD  triamcinolone ointment (KENALOG) 0.1 %  05/21/18   [provider]     Allergies:     Allergies  Allergen Reactions  . Compazine [Prochlorperazine Edisylate] Other (See Comments)    Altered mental status  . Penicillins Hives    Has patient had a PCN reaction causing immediate rash, facial/tongue/throat swelling, SOB or lightheadedness with hypotension: No Has patient had a PCN reaction causing severe rash involving mucus membranes or skin necrosis: No Has patient had a PCN reaction that required hospitalization No Has patient had a PCN reaction occurring within the last 10 years: No If all of the above answers are "NO", then may proceed with Cephalosporin use.   Marland Kitchen Phenergan [Promethazine Hcl] Other (See Comments)    Altered mental status  . Lipitor [Atorvastatin] Swelling     Physical Exam:   Vitals  Blood pressure (!) 163/87, pulse 96, temperature 98.3 F (36.8 C), temperature source Rectal, resp. rate (!) 27, SpO2 94 %.  Physical Examination: General appearance -awakens, able to answer questions but then falls asleep again, and in no distress and  Mental status -somewhat lethargic  eyes - sclera anicteric Neck - supple, no JVD elevation , Chest - clear  to auscultation bilaterally, symmetrical air movement,  Heart - S1 and S2 normal, regular  Abdomen - soft, nontender, nondistended, no masses or organomegaly Neurological -somewhat sleepy, neck supple without rigidity, cranial nerves II through XII intact, DTR's normal and symmetric Extremities - no pedal edema noted, intact peripheral pulses  Skin - warm, dry     Data Review:    CBC Recent Labs  Lab 08/28/18 1301 08/28/18 1311 08/28/18 1700  WBC 13.4*  --  10.5  HGB 8.5* 9.9* 6.1*  HCT 29.0* 29.0* 20.5*  PLT 341  --  270  MCV 86.8  --  86.9  MCH 25.4*  --  25.8*  MCHC 29.3*  --  29.8*  RDW 15.7*   --  15.7*  LYMPHSABS 1.9  --  2.2  MONOABS 0.9  --  0.9  EOSABS 0.1  --  0.0  BASOSABS 0.0  --  0.0   ------------------------------------------------------------------------------------------------------------------  Chemistries  Recent Labs  Lab 08/28/18 1301 08/28/18 1311  NA 134* 133*  K 5.5* 5.5*  CL 101 103  CO2 24  --   GLUCOSE 416* 428*  BUN 31* 36*  CREATININE 1.83* 1.70*  CALCIUM 9.0  --   AST 27  --   ALT 11  --   ALKPHOS 207*  --   BILITOT 0.4  --    ------------------------------------------------------------------------------------------------------------------ estimated creatinine clearance is 23 mL/min (A) (by C-G formula based on SCr of 1.7 mg/dL (H)). ------------------------------------------------------------------------------------------------------------------ No results for input(s): TSH, T4TOTAL, T3FREE, THYROIDAB in the last 72 hours.  Invalid input(s): FREET3   Coagulation profile Recent Labs  Lab 08/28/18 1410  INR 1.10   ------------------------------------------------------------------------------------------------------------------- No results for input(s): DDIMER in the last 72 hours. -------------------------------------------------------------------------------------------------------------------  Cardiac Enzymes No results for input(s): CKMB, TROPONINI, MYOGLOBIN in the last 168  hours.  Invalid input(s): CK ------------------------------------------------------------------------------------------------------------------ No results found for: BNP   ---------------------------------------------------------------------------------------------------------------  Urinalysis    Component Value Date/Time   COLORURINE STRAW (A) 08/28/2018 1700   APPEARANCEUR CLEAR 08/28/2018 1700   LABSPEC 1.007 08/28/2018 1700   PHURINE 6.0 08/28/2018 1700   GLUCOSEU >=500 (A) 08/28/2018 1700   HGBUR MODERATE (A) 08/28/2018 1700   BILIRUBINUR  NEGATIVE 08/28/2018 1700   KETONESUR NEGATIVE 08/28/2018 1700   PROTEINUR 100 (A) 08/28/2018 1700   UROBILINOGEN 0.2 05/09/2015 2109   NITRITE NEGATIVE 08/28/2018 1700   LEUKOCYTESUR NEGATIVE 08/28/2018 1700    ----------------------------------------------------------------------------------------------------------------   Imaging Results:    Ct Head Wo Contrast  Result Date: 08/28/2018 CLINICAL DATA:  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 COMPARISON:  Head CT August 14, 2015 and brain MRI August 14, 2015 FINDINGS: Brain: There is mild age related volume loss. There is no intracranial mass, hemorrhage, extra-axial fluid collection, or midline shift. There is slight small vessel disease in the centra semiovale bilaterally. No acute infarct evident. Vascular: There is no hyperdense vessel. There is calcification in each carotid siphon region. Skull: Bony calvarium appears intact, although bones are osteoporotic. Sinuses/Orbits: There is mucosal thickening in several ethmoid air cells. Orbits appear symmetric bilaterally. Other: Mastoid air cells are clear. There is debris in each external auditory canal. IMPRESSION: Age related volume loss with slight periventricular small vessel disease. No mass or hemorrhage. No evident acute infarct. Foci of arterial vascular calcification noted. Mucosal thickening in several ethmoid air cells. Probable cerumen in each external auditory canal. Bones osteoporotic. Electronically Signed   By: Lowella Grip III M.D.   On: 08/28/2018 15:08   Dg Chest Port 1 View  Result Date: 08/28/2018 CLINICAL DATA:  Bloody stool.  Hematemesis. EXAM: PORTABLE CHEST 1 VIEW COMPARISON:  08/20/2018. FINDINGS: Mediastinum and hilar structures normal. Stable coarse interstitial markings again noted. No acute infiltrate. No pleural  effusion or pneumothorax. Degenerative changes and scoliosis thoracic spine. Diffuse osteopenia. IMPRESSION: Stable chronic change again noted.  No acute abnormality identified. 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 COMPARISON:  Head CT August 14, 2015 and brain MRI August 14, 2015 FINDINGS: Brain: There is mild age related volume loss. There is no intracranial mass, hemorrhage, extra-axial fluid collection, or midline shift. There is slight small vessel disease in the centra semiovale bilaterally. No acute infarct evident. Vascular: There is no hyperdense vessel. There is calcification in each carotid siphon region. Skull: Bony calvarium appears intact, although bones are osteoporotic. Sinuses/Orbits: There is mucosal thickening in several ethmoid air cells. Orbits appear symmetric bilaterally. Other: Mastoid air cells are clear. There is debris in each external auditory canal. IMPRESSION: Age related volume loss with slight periventricular small vessel disease. No mass or hemorrhage. No evident acute infarct. Foci of arterial vascular calcification noted. Mucosal thickening in several ethmoid air cells. Probable cerumen in each external auditory canal. Bones osteoporotic. Electronically Signed   By: Lowella Grip III M.D.   On: 08/28/2018 15:08   Dg Chest Port 1 View  Result Date: 08/28/2018 CLINICAL DATA:  Bloody stool.  Hematemesis. EXAM: PORTABLE CHEST 1 VIEW COMPARISON:  08/20/2018. FINDINGS: Mediastinum and hilar structures normal. Stable coarse interstitial markings again noted. No acute infiltrate. No pleural effusion or pneumothorax. Degenerative changes and scoliosis thoracic  spine. Diffuse osteopenia. IMPRESSION: Stable chronic change again noted.  No acute abnormality identified. Electronically Signed   By: Marcello Moores  Register   On: 08/28/2018 13:20    DVT Prophylaxis -SCD   AM Labs Ordered, also please review Full Orders  Family Communication: Admission, patients condition and plan of care including tests being ordered have been discussed with the patient who indicate understanding and agree with the plan   Code Status - Full Code  Likely DC to  TBD  Condition   stable  Roxan Hockey M.D on 08/28/2018 at 5:42 PM Pager---[phone] Go to [email or web address] - [email or web address] for contact info  [facility] - Office  [phone]

## 2018-08-28 NOTE — ED Notes (Signed)
Communicated to [employer] critical results

## 2018-08-28 NOTE — ED Provider Notes (Addendum)
[facility] EMERGENCY DEPARTMENT Provider Note   CSN: [id number] Arrival date & time: 08/28/18  1149     History   Chief Complaint No chief complaint on file.   HPI [name] is a 72 y.o. female.  HPI   Patient is a 72 year old female with a history of alcoholic cirrhosis (reports no alcohol in 2 years), peptic ulcer disease, type 2 diabetes mellitus, grade 1 diastolic dysfunction presenting for left eye pain and left-sided headache, rectal bleeding, bright red blood and "spitting up", and some left-sided chest pain.  Patient is unable to identify when all the symptoms started.  She has an aide that assist her at home, however was unable to identify when her last known normal was.  According to EMS note, they thought she had some left-sided facial droop, but no other appreciable weakness.  Patient reports that she gets headaches, however does not usually affect her left eye.  She reports some blurred vision in her left eye currently.  She denies temporal pain.  She denies any watery discharge from her eye.  Patient denies any shortness of breath, cough, or hemoptysis.  She denies hematemesis.  She reports she is unsure where the blood in her mouth came from.  Patient denies any weakness or numbness in her extremities, difficulty with speech or swallowing.  Patient is a poor historian of her symptoms.   Past Medical History:  Diagnosis Date  . Acute venous embolism and thrombosis of deep vessels of proximal lower extremity ([place])   . Alcoholic cirrhosis ([place])   . Anemia   . Anxiety   . Arthritis   . Asthma   . Back pain   . Blood transfusion few years ago  . Cellulitis    bilateral lower extremities  . CHF (congestive heart failure) ([place]) 10/13   grade 1 diastolic dysfunction, Nl LVF  . Chronic diastolic congestive heart failure ([place])   . Chronic obstructive pulmonary disease (COPD) ([place])   . Colon polyps   . Depression   . Diabetes mellitus    insulin  dep   . Gastroesophageal reflux   . Glaucoma    left  . Gout   . Hepatitis    alcoholic hepatitis  . History of GI diverticular bleed   . Hypertension   . Leg swelling   . On home oxygen therapy oxygen 2 liter per minute prn per [facility]  . Pneumonia    history of  . PVD (peripheral vascular disease) ([place]) 2009   bilat iliac stenting  . Reflux esophagitis   . Villous adenoma of colon 05/10/11    Patient Active Problem List   Diagnosis Date Noted  . Gout 08/10/2015  . Hypoglycemia   . Hypothermia   . UTI (lower urinary tract infection)   . Humeral head fracture 06/05/2015  . Idiopathic chronic gout of multiple sites without tophus 06/05/2015  . Chronic renal disease, stage III ([place]) 06/05/2015  . Nausea vomiting and diarrhea 05/09/2015  . Increased urinary frequency 05/09/2015  . Sepsis ([place]) 05/09/2015  . Hypokalemia   . Chronic diastolic congestive heart failure ([place])   . Hyperlipidemia 02/10/2015  . Cellulitis 11/04/2014  . Essential hypertension 08/27/2014  . Abdominal pain, acute, bilateral lower quadrant 07/26/2014  . Colitis 07/26/2014  . GI bleeding 06/14/2014  . GI bleed 06/14/2014  . Acute encephalopathy 06/14/2014  . Personal history of colonic polyps 08/06/2013  . Knee pain, acute 05/20/2013  . Claudication ([place]) 03/13/2013  . Obesity  03/13/2013  . PVD, LEIA/LIIA and RCIA pta 7/09- ABIs 0.68 and 0.69 Feb 2014 11/17/2012  . PUD (peptic ulcer disease) GI bleed in past (2011) 11/17/2012  . Cirrhosis of liver ([place]) 11/17/2012  . Colon cancer, lap chole 2012 11/17/2012  . 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 Atypical chest pain- no history of CAD. Myoview low risk 2012 11/15/2012  . Controlled diabetes mellitus type 2 with complications ([place]) 77/93/9030  . Acute renal failure superimposed on stage 3 chronic kidney disease ([place]) 06/16/2012  . Incisional hernia 01/09/2012  . Anemia 01/28/2011  . COPD (chronic obstructive pulmonary disease)  ([place]) 01/28/2011  . Sleep apnea 01/28/2011  . Transfusion history 01/28/2011  . Sinus problem 01/28/2011  . Full dentures 01/28/2011    Past Surgical History:  Procedure Laterality Date  . ABDOMINAL HYSTERECTOMY  yrs ago  . CATARACT EXTRACTION Right yrs ago  . COLONOSCOPY    . COLONOSCOPY WITH PROPOFOL N/A 08/06/2013   Procedure: COLONOSCOPY WITH PROPOFOL;  Surgeon: Lear Ng, MD;  Location: [facility];  Service: Endoscopy;  Laterality: N/A;  . ESOPHAGOGASTRODUODENOSCOPY (EGD) WITH PROPOFOL N/A 08/06/2013   Procedure: ESOPHAGOGASTRODUODENOSCOPY (EGD) WITH PROPOFOL;  Surgeon: Lear Ng, MD;  Location: [facility];  Service: Endoscopy;  Laterality: N/A;  . LAPAROSCOPIC ASSISSTED TOTAL COLECTOMY W/ J-POUCH  04/22/11  . VENTRAL HERNIA REPAIR  06/13/2012   Procedure: LAPAROSCOPIC VENTRAL HERNIA;  Surgeon: Adin Hector, MD;  Location: [facility];  Service: General;  Laterality: N/A;  Laparoscopic Assisted Ventral Hernia with Mesh     OB History   No obstetric history on file.      Home Medications    Prior to Admission medications   Medication Sig Start Date End Date Taking? Authorizing Provider  albuterol (PROVENTIL HFA;VENTOLIN HFA) 108 (90 BASE) MCG/ACT inhaler Inhale 2 puffs into the lungs every 6 (six) hours as needed. For shortness of breath    [provider]  albuterol (PROVENTIL) (2.5 MG/3ML) 0.083% nebulizer solution Take 2.5 mg by nebulization every 6 (six) hours as needed for wheezing or shortness of breath.    [provider]  allopurinol (ZYLOPRIM) 100 MG tablet Take 100 mg by mouth 2 (two) times daily. 01/18/16   [provider]  aspirin 81 MG EC tablet Take 81 mg by mouth daily.      [provider]  cephALEXin (KEFLEX) 500 MG capsule Take 1 capsule (500 mg total) by mouth 2 (two) times daily. Patient not taking: Reported on 08/20/2018 11/30/17   Malvin Johns, MD  cilostazol (PLETAL) 50 MG tablet Take 50 mg by mouth  daily.  01/18/16   [provider]  cloNIDine (CATAPRES) 0.1 MG tablet Take 1 tablet (0.1 mg total) by mouth 3 (three) times daily. 09/05/14   [name], MD  diclofenac sodium (VOLTAREN) 1 % GEL Apply 2 g topically 4 (four) times daily.  05/21/18   [provider]  ergocalciferol (VITAMIN D2) 50000 UNITS capsule Take 50,000 Units by mouth every Monday. On Monday    [provider]  ferrous sulfate 325 (65 FE) MG tablet Take 325 mg by mouth daily with breakfast.    [provider]  fluticasone (FLONASE) 50 MCG/ACT nasal spray Place 2 sprays into the nose daily as needed for allergies.     [provider]  folic acid (FOLVITE) 1 MG tablet Take 1 tablet (1 mg total) by mouth daily. 08/18/15   Hongalgi, Lenis Dickinson, MD  furosemide (LASIX)  20 MG tablet Take 1 tablet (20 mg total) by mouth daily. 08/18/15   Hongalgi, Lenis Dickinson, MD  glimepiride (AMARYL) 4 MG tablet Take 4 mg by mouth daily.  03/27/14   [provider]  HYDROcodone-acetaminophen (NORCO/VICODIN) 5-325 MG tablet Take 1-2 tablets by mouth every 6 (six) hours as needed for severe pain. Patient not taking: Reported on 08/20/2018 01/24/17   Little, Wenda Overland, MD  LANTUS SOLOSTAR 100 UNIT/ML Solostar Pen Inject 30 Units as directed every morning.  01/18/16   [provider]  lisinopril (PRINIVIL,ZESTRIL) 5 MG tablet Take 5 mg by mouth daily. 01/18/16   [provider]  magnesium oxide (MAG-OX) 400 MG tablet Take 400 mg by mouth daily.    [provider]  methocarbamol (ROBAXIN) 500 MG tablet Take 1 tablet (500 mg total) by mouth every 8 (eight) hours as needed for muscle spasms. 02/17/16   Mesner, Corene Cornea, MD  metoprolol (LOPRESSOR) 50 MG tablet Take 50 mg by mouth daily. 01/18/16   [provider]  montelukast (SINGULAIR) 10 MG tablet Take 10 mg by mouth at bedtime.     [provider]  naproxen (NAPROSYN) 375 MG tablet Take 1 tablet (375 mg total) by  mouth 2 (two) times daily as needed. 05/06/18   Ward, Ozella Almond, PA-C  omeprazole (PRILOSEC) 40 MG capsule Take 40 mg by mouth daily.     [provider]  OXYGEN-HELIUM IN Inhale 2 L into the lungs at bedtime. 2 liters nightly    [provider]  simvastatin (ZOCOR) 10 MG tablet Take 10 mg by mouth daily.  11/30/14   [provider]  sitaGLIPtin (JANUVIA) 50 MG tablet Take 1 tablet by mouth daily. 03/27/14   [provider]  spironolactone (ALDACTONE) 25 MG tablet Take 0.5 tablets (12.5 mg total) by mouth daily. 08/18/15   Hongalgi, Lenis Dickinson, MD  thiamine 100 MG tablet Take 1 tablet (100 mg total) by mouth daily. 08/18/15   Hongalgi, Lenis Dickinson, MD  tiZANidine (ZANAFLEX) 2 MG tablet Take 2 mg by mouth 2 (two) times daily as needed. 01/04/17   [provider]  traMADol (ULTRAM) 50 MG tablet Take 1 tablet (50 mg total) by mouth every 6 (six) hours as needed. 05/06/18   Ward, Ozella Almond, PA-C  triamcinolone ointment (KENALOG) 0.1 %  05/21/18   [provider]    Family History Family History  Problem Relation Age of Onset  . Cancer Brother        heent ca  . Cancer Sister        liver ca    Social History Social History   Tobacco Use  . Smoking status: Current Some Day Smoker    Packs/day: 0.50    Years: 30.00    Pack years: 15.00    Types: Cigarettes    Last attempt to quit: 11/06/2005    Years since quitting: 12.8  . Smokeless tobacco: Never Used  Substance Use Topics  . Alcohol use: No    Comment: stopped 2005  . Drug use: No     Allergies   Compazine [prochlorperazine edisylate]; Penicillins; Phenergan [promethazine hcl]; and Lipitor [atorvastatin]   Review of Systems Review of Systems  Constitutional: Negative for chills and fever.  HENT: Negative for congestion, rhinorrhea, sinus pain and sore throat.   Eyes: Positive for visual disturbance.  Respiratory: Negative for cough, chest tightness and shortness of breath.     Cardiovascular: Positive for chest pain. Negative for palpitations and  leg swelling.  Gastrointestinal: Positive for blood in stool. Negative for abdominal pain, constipation, diarrhea, nausea and vomiting.  Genitourinary: Negative for dysuria and flank pain.  Musculoskeletal: Negative for back pain and myalgias.  Skin: Negative for rash.  Neurological: Positive for headaches. Negative for dizziness, syncope and light-headedness.     Physical Exam Updated Vital Signs BP 131/75 (BP Location: Right Arm)   Pulse (!) 108   Temp 98 F (36.7 C) (Oral)   Resp 18   SpO2 98%   Physical Exam Vitals signs and nursing note reviewed.  Constitutional:      General: She is not in acute distress.    Appearance: She is well-developed.  HENT:     Head: Normocephalic and atraumatic.  Eyes:     General:        Right eye: No discharge.        Left eye: No discharge.     Extraocular Movements: Extraocular movements intact.     Conjunctiva/sclera: Conjunctivae normal.     Pupils: Pupils are equal, round, and reactive to light.     Comments:   Visual Acuity  Right Eye Distance: 10/20 Left Eye Distance:   Bilateral Distance: 10/32  Right Eye Near:   Left Eye Near:    Bilateral Near:     Bilateral conjunctiva without erythema or edema.  No periorbital erythema or swelling.  Tono-Pen pressures 24 and left eye, 25 right eye.  No pain over bilateral temporal arteries.  Neck:     Musculoskeletal: Normal range of motion and neck supple.  Cardiovascular:     Rate and Rhythm: Normal rate and regular rhythm.     Heart sounds: S1 normal and S2 normal. No murmur.  Pulmonary:     Effort: Pulmonary effort is normal.     Breath sounds: Normal breath sounds. No wheezing or rales.  Abdominal:     General: There is no distension.     Palpations: Abdomen is soft.     Tenderness: There is no abdominal tenderness. There is no guarding.  Genitourinary:    Comments: Rectal examination performed with RN  chaperone present.  Patient is normal rectal tone.  No prolapsed internal hemorrhoids.  Internal hemorrhoids not palpated.  Patient has brown stool mixed with blood in the rectal vault.  No melena. Musculoskeletal: Normal range of motion.        General: No deformity.  Lymphadenopathy:     Cervical: No cervical adenopathy.  Skin:    General: Skin is warm and dry.     Findings: No erythema or rash.  Neurological:     Mental Status: She is alert.     Comments: Cranial nerves grossly intact. Patient moves extremities symmetrically and with good coordination.  Psychiatric:        Behavior: Behavior normal.        Thought Content: Thought content normal.        Judgment: Judgment normal.      ED Treatments / Results  Labs (all labs ordered are listed, but only abnormal results are displayed) Labs Reviewed  COMPREHENSIVE METABOLIC PANEL - Abnormal; Notable for the following components:      Result Value   Sodium 134 (*)    Potassium 5.5 (*)    Glucose, Bld 416 (*)    BUN 31 (*)    Creatinine, Ser 1.83 (*)    Total Protein 6.4 (*)    Albumin 2.2 (*)    Alkaline Phosphatase 207 (*)  GFR calc non Af Amer 27 (*)    GFR calc Af Amer 31 (*)    All other components within normal limits  CBC WITH DIFFERENTIAL/PLATELET - Abnormal; Notable for the following components:   WBC 13.4 (*)    RBC 3.34 (*)    Hemoglobin 8.5 (*)    HCT 29.0 (*)    MCH 25.4 (*)    MCHC 29.3 (*)    RDW 15.7 (*)    Neutro Abs 10.5 (*)    All other components within normal limits  CBG MONITORING, ED - Abnormal; Notable for the following components:   Glucose-Capillary 397 (*)    All other components within normal limits  POC OCCULT BLOOD, ED - Abnormal; Notable for the following components:   Fecal Occult Bld POSITIVE (*)    All other components within normal limits  I-STAT CHEM 8, ED - Abnormal; Notable for the following components:   Sodium 133 (*)    Potassium 5.5 (*)    BUN 36 (*)    Creatinine,  Ser 1.70 (*)    Glucose, Bld 428 (*)    Hemoglobin 9.9 (*)    HCT 29.0 (*)    All other components within normal limits  I-STAT CG4 LACTIC ACID, ED - Abnormal; Notable for the following components:   Lactic Acid, Venous 2.21 (*)    All other components within normal limits  PROTIME-INR  SEDIMENTATION RATE  URINALYSIS, ROUTINE W REFLEX MICROSCOPIC  I-STAT TROPONIN, ED  TYPE AND SCREEN    EKG EKG Interpretation  Date/Time:  Tuesday August 28 2018 11:58:16 EST Ventricular Rate:  113 PR Interval:    QRS Duration: 72 QT Interval:  317 QTC Calculation: 435 R Axis:   31 Text Interpretation:  Sinus tachycardia Abnormal R-wave progression, early transition No significant change since last tracing Confirmed by Orlie Dakin [phone]) on 08/28/2018 4:33:31 PM   Radiology Ct Head Wo Contrast  Result Date: 08/28/2018 CLINICAL DATA:  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 COMPARISON:  Head CT August 14, 2015 and brain MRI August 14, 2015 FINDINGS: Brain: There is mild age related volume loss. There is no intracranial mass, hemorrhage, extra-axial fluid collection, or midline shift. There is slight small vessel disease in the centra semiovale bilaterally. No acute infarct evident. Vascular: There is no hyperdense vessel. There is calcification in each carotid siphon region. Skull: Bony calvarium appears intact, although bones are osteoporotic. Sinuses/Orbits: There is mucosal thickening in several ethmoid air cells. Orbits appear symmetric bilaterally. Other: Mastoid air cells are clear. There is debris in each external auditory canal. IMPRESSION: Age related volume loss with slight periventricular small vessel disease. No mass or hemorrhage. No evident acute infarct. Foci of arterial vascular calcification noted. Mucosal thickening in several  ethmoid air cells. Probable cerumen in each external auditory canal. Bones osteoporotic. Electronically Signed   By: Lowella Grip III M.D.   On: 08/28/2018 15:08   Dg Chest Port 1 View  Result Date: 08/28/2018 CLINICAL DATA:  Bloody stool.  Hematemesis. EXAM: PORTABLE CHEST 1 VIEW COMPARISON:  08/20/2018. FINDINGS: Mediastinum and hilar structures normal. Stable coarse interstitial markings again noted. No acute infiltrate. No pleural effusion or pneumothorax. Degenerative changes and scoliosis thoracic spine. Diffuse osteopenia. IMPRESSION: Stable chronic change again noted.  No acute abnormality identified. Electronically Signed   By: Marcello Moores  Register   On: 08/28/2018 13:20  Procedures Procedures (including critical care time)  CRITICAL CARE Performed by: Albesa Seen   Total critical care time: 35 minutes  Critical care time was exclusive of separately billable procedures and treating other patients.  Critical care was necessary to treat or prevent imminent or life-threatening deterioration.  Critical care was time spent personally by me on the following activities: development of treatment plan with patient and/or surrogate as well as nursing, discussions with consultants, evaluation of patient's response to treatment, examination of patient, obtaining history from patient or surrogate, ordering and performing treatments and interventions, ordering and review of laboratory studies, ordering and review of radiographic studies, pulse oximetry and re-evaluation of patient's condition.  Medications Ordered in ED Medications - No data to display   Initial Impression / Assessment and Plan / ED Course  I have reviewed the triage vital signs and the nursing notes.  Pertinent labs & imaging results that were available during my care of the patient were reviewed by me and considered in my medical decision making (see chart for details).  Clinical Course as of Aug 28 1702  Tue Aug 28, 2018  1428 Tonopen pressure 24 left eye, 25 right eye.    [AM]  1429 Pt receiving fluids.  Glucose(!): 416 [AM]  1430 Steadily declining.  Hemoglobin(!): 8.5 [AM]  1450 Likely decrease with fluids.  Will hold Lasix and cation exchange due to concerns about volume depletion.   Potassium(!): 5.5 [AM]  1500 Likely 2/2 GI bleeding or dehydration.   Lactic Acid, Venous(!!): 2.21 [AM]    Clinical Course User Index [AM] Albesa Seen, PA-C    Patient is nontoxic-appearing, neurologically intact, but tachycardic initial examination.  Patient's presentation consistent with GI bleeding.  Patient also with other constellation of symptoms including headache, visual disturbance.  Work-up is not consistent with acute angle-closure glaucoma.  CT is negative for intracranial hemorrhage.  Patient has no external abnormalities on her ophthalmic exam.  ESR ordered, however no pain over temporal arteries and have low suspicion for temporal arteritis.  Patient is neurologically intact, and do not suspect CVA.  Do not suspect sepsis as cause of patient's leukocytosis and lactic acidosis.  Patient had normal rectal temperature, performed by me.  No other localizing infectious symptomatology.  Will discuss case with gastroenterology.  Suspect lactic acidosis is secondary to GI bleeding.  Patient had endoscopies in 2014 which showed no varices, but gastritis.  Patient treated with Protonix bolus and infusion and fluids. Hemoglobin stable at 8.5. Awaiting consult from [facility].   5:13 PM Spoke with Dr. Alessandra Bevels of GI who recommends n.p.o. at midnight.  Order placed.  GI to follow.  Appreciate their involvement in the care of this patient.  Patient made hemodynamically stable during ED course.  Patient admitted to [facility] hospitalist per Dr. Roxan Hockey.  Appreciate his involvement in the care of this patient.  This is a shared visit with Dr. Orlie Dakin. Patient was independently evaluated by this  attending physician. Attending physician consulted in evaluation and admission management.  Final Clinical Impressions(s) / ED Diagnoses   Final diagnoses:  Chest pain  Gastrointestinal hemorrhage, unspecified gastrointestinal hemorrhage type  Lactic acidosis  Normocytic anemia    ED Discharge Orders    None       Tamala Julian 08/28/18 1635    Albesa Seen, PA-C 08/28/18 1704    Tamala Julian 08/28/18 1714    Orlie Dakin, MD 08/28/18 [place],  Emelia Loron 09/21/18 [place], MD 09/21/18 1355

## 2018-08-28 NOTE — ED Provider Notes (Addendum)
Patient has felt tired and sluggish for the past 2 days.  She reports that she has been seeing blood in her mouth and has noticed blood in her stools.  She denies focal numbness or weakness..  Denies pain anywhere.  Denies vomiting she is.  Denies shortness of breath.   Orlie Dakin, MD 08/28/18 1353 CRITICAL CARE Performed by: Orlie Dakin Total critical care time: 35 minutes Critical care time was exclusive of separately billable procedures and treating other patients. Critical care was necessary to treat or prevent imminent or life-threatening deterioration. Critical care was time spent personally by me on the following activities: development of treatment plan with patient and/or surrogate as well as nursing, discussions with consultants, evaluation of patient's response to treatment, examination of patient, obtaining history from patient or surrogate, ordering and performing treatments and interventions, ordering and review of laboratory studies, ordering and review of radiographic studies, pulse oximetry and re-evaluation of patient's condition.   Orlie Dakin, MD 09/21/18 1410

## 2018-08-28 NOTE — ED Notes (Signed)
Got patient on the monitor into a gown did ekg shown to er doctor did cbg it was 397 notified RN of blood sugar patient is resting with nurse at bedside and call bell in reach

## 2018-08-28 NOTE — ED Triage Notes (Addendum)
Pt arrived via [facility]; pt from [facility] with c/o bloody stool, spitting up blood, drooping in L side, no other deficits present per EMS; LSW was Sat 12/28 pt A/Ox4; CBG 465; 125/62, 97% on RA, 110, 18

## 2018-08-29 DIAGNOSIS — D649 Anemia, unspecified: Secondary | ICD-10-CM | POA: Diagnosis not present

## 2018-08-29 DIAGNOSIS — K922 Gastrointestinal hemorrhage, unspecified: Secondary | ICD-10-CM | POA: Diagnosis not present

## 2018-08-29 DIAGNOSIS — N179 Acute kidney failure, unspecified: Secondary | ICD-10-CM | POA: Diagnosis not present

## 2018-08-29 LAB — CBC
HCT: 32.5 % — ABNORMAL LOW (ref 36.0–46.0)
HCT: 33.5 % — ABNORMAL LOW (ref 36.0–46.0)
HEMOGLOBIN: 9.6 g/dL — AB (ref 12.0–15.0)
Hemoglobin: 10.3 g/dL — ABNORMAL LOW (ref 12.0–15.0)
MCH: 25.2 pg — ABNORMAL LOW (ref 26.0–34.0)
MCH: 25.7 pg — ABNORMAL LOW (ref 26.0–34.0)
MCHC: 29.5 g/dL — ABNORMAL LOW (ref 30.0–36.0)
MCHC: 30.7 g/dL (ref 30.0–36.0)
MCV: 85.3 fL (ref 80.0–100.0)
MCV: 83.5 fL (ref 80.0–100.0)
Platelets: 329 10*3/uL (ref 150–400)
Platelets: 323 10*3/uL (ref 150–400)
RBC: 3.81 MIL/uL — ABNORMAL LOW (ref 3.87–5.11)
RBC: 4.01 MIL/uL (ref 3.87–5.11)
RDW: 15.9 % — ABNORMAL HIGH (ref 11.5–15.5)
RDW: 15.3 % (ref 11.5–15.5)
WBC: 15.6 10*3/uL — ABNORMAL HIGH (ref 4.0–10.5)
WBC: 16.5 10*3/uL — ABNORMAL HIGH (ref 4.0–10.5)
nRBC: 0 % (ref 0.0–0.2)
nRBC: 0 % (ref 0.0–0.2)

## 2018-08-29 LAB — BASIC METABOLIC PANEL
Anion gap: 7 (ref 5–15)
BUN: 23 mg/dL (ref 8–23)
CHLORIDE: 109 mmol/L (ref 98–111)
CO2: 24 mmol/L (ref 22–32)
Calcium: 8.5 mg/dL — ABNORMAL LOW (ref 8.9–10.3)
Creatinine, Ser: 1.36 mg/dL — ABNORMAL HIGH (ref 0.44–1.00)
GFR calc Af Amer: 44 mL/min — ABNORMAL LOW (ref >60)
GFR calc non Af Amer: 38 mL/min — ABNORMAL LOW (ref >60)
Glucose, Bld: 88 mg/dL (ref 70–99)
Potassium: 4.3 mmol/L (ref 3.5–5.1)
Sodium: 140 mmol/L (ref 135–145)

## 2018-08-29 LAB — GLUCOSE, CAPILLARY
Glucose-Capillary: 244 mg/dL — ABNORMAL HIGH (ref 70–99)
Glucose-Capillary: 82 mg/dL (ref 70–99)
Glucose-Capillary: 88 mg/dL (ref 70–99)
Glucose-Capillary: 212 mg/dL — ABNORMAL HIGH (ref 70–99)
Glucose-Capillary: 123 mg/dL — ABNORMAL HIGH (ref 70–99)

## 2018-08-29 LAB — LACTIC ACID, PLASMA: Lactic Acid, Venous: 0.9 mmol/L (ref 0.5–1.9)

## 2018-08-29 NOTE — Progress Notes (Signed)
Patient status post blood transfusion x 1 unit, no adverse reactions noted. Vitals within normal ranges. Will continue to monitor.

## 2018-08-29 NOTE — Progress Notes (Signed)
Patient alert and oriented x 3-4 denies pain with assess. Written consent obtained for blood transfusion, 1 unit blood transfused per orders. Patient did not require both units due to Hemoglobin stabilization.  Will continue to monitor.

## 2018-08-29 NOTE — Consult Note (Signed)
Referring Provider:  EDP Primary Care Physician:  Nolene Ebbs, MD Primary Gastroenterologist:  Dr.  Michail Sermon   Reason for Consultation: GI bleed  HPI: [name] is a 73 y.o. female with past medical history of chronic anemia, history of possible cirrhosis (CT scan in April 2019 did not mention cirrhosis), history of ulcer disease, history of peripheral vascular disease and history of COPD presented to the emergency room with GI bleed.  Her hemoglobin was around 8.5 which is her baseline on initial presentation which subsequently dropped to 6.1.  She has received blood transfusion and her hemoglobin is now up to 10.3.  Patient seen and examined at bedside.  According to patient, she started seeing maroon-colored blood in the stool yesterday.  She denies any vomiting of blood.  She denies abdominal pain.  Complaining of cough and occasional sputum production.  Denies weight loss.  Denies diarrhea or constipation.   Previous GI work-up CT abdomen pelvis with contrast in April 2019 showed vague hypodensity within the medial right hepatic lobe near the IVC measuring 2.5 cm, changes of chronic pancreatitis, indeterminant lesion within the mid left kidney as well as changes of right hemicolectomy.  MRI abdomen was recommended for follow-up.   EGD in December 2014 by Dr. Michail Sermon showed mild duodenitis otherwise normal.  Repeat was recommended in 2 years  Colonoscopy in December 2014 showed normal ileocolonic anastomosis and diverticulosis.  Repeat was recommended in 3 years.  Past Medical History:  Diagnosis Date  . Acute venous embolism and thrombosis of deep vessels of proximal lower extremity ([place])   . Alcoholic cirrhosis ([place])   . Anemia   . Anxiety   . Arthritis   . Asthma   . Back pain   . Blood transfusion few years ago  . Cellulitis    bilateral lower extremities  . CHF (congestive heart failure) ([place]) 10/13   grade 1 diastolic dysfunction, Nl LVF  . Chronic diastolic  congestive heart failure ([place])   . Chronic obstructive pulmonary disease (COPD) ([place])   . Colon polyps   . Depression   . Diabetes mellitus    insulin dep   . Gastroesophageal reflux   . GI bleeding 08/28/2018  . Glaucoma    left  . Gout   . Hepatitis    alcoholic hepatitis  . History of GI diverticular bleed   . Hypertension   . Leg swelling   . On home oxygen therapy    "2L just at night" (08/28/2018)  . Pneumonia    history of  . PVD (peripheral vascular disease) ([place]) 2009   bilat iliac stenting  . Reflux esophagitis   . Villous adenoma of colon 05/10/11    Past Surgical History:  Procedure Laterality Date  . ABDOMINAL HYSTERECTOMY  yrs ago  . CATARACT EXTRACTION Right yrs ago  . COLONOSCOPY    . COLONOSCOPY WITH PROPOFOL N/A 08/06/2013   Procedure: COLONOSCOPY WITH PROPOFOL;  Surgeon: Lear Ng, MD;  Location: [facility];  Service: Endoscopy;  Laterality: N/A;  . ESOPHAGOGASTRODUODENOSCOPY (EGD) WITH PROPOFOL N/A 08/06/2013   Procedure: ESOPHAGOGASTRODUODENOSCOPY (EGD) WITH PROPOFOL;  Surgeon: Lear Ng, MD;  Location: [facility];  Service: Endoscopy;  Laterality: N/A;  . LAPAROSCOPIC ASSISSTED TOTAL COLECTOMY W/ J-POUCH  04/22/11  . VENTRAL HERNIA REPAIR  06/13/2012   Procedure: LAPAROSCOPIC VENTRAL HERNIA;  Surgeon: Adin Hector, MD;  Location: [facility];  Service: General;  Laterality: N/A;  Laparoscopic Assisted Ventral Hernia with Mesh    Prior  to Admission medications   Medication Sig Start Date End Date Taking? Authorizing Provider  allopurinol (ZYLOPRIM) 100 MG tablet Take 100 mg by mouth daily.  01/18/16  Yes [provider]  aspirin 81 MG EC tablet Take 81 mg by mouth daily.     Yes [provider]  ergocalciferol (VITAMIN D2) 50000 UNITS capsule Take 50,000 Units by mouth every Monday. On Monday   Yes [provider]  fluticasone (FLONASE) 50 MCG/ACT nasal spray Place 2 sprays into the nose daily as needed for  allergies.    Yes [provider]  LANTUS SOLOSTAR 100 UNIT/ML Solostar Pen Inject 30 Units as directed every morning.  01/18/16  Yes [provider]  metoprolol (LOPRESSOR) 50 MG tablet Take 50 mg by mouth daily. 01/18/16  Yes [provider]  montelukast (SINGULAIR) 10 MG tablet Take 10 mg by mouth at bedtime.    Yes [provider]  naproxen (NAPROSYN) 375 MG tablet Take 1 tablet (375 mg total) by mouth 2 (two) times daily as needed. 05/06/18  Yes Ward, Ozella Almond, PA-C  omeprazole (PRILOSEC) 40 MG capsule Take 40 mg by mouth daily.    Yes [provider]  simvastatin (ZOCOR) 10 MG tablet Take 10 mg by mouth daily.  11/30/14  Yes [provider]  sitaGLIPtin (JANUVIA) 100 MG tablet Take 100 mg by mouth daily.  03/27/14  Yes [provider]  tiZANidine (ZANAFLEX) 2 MG tablet Take 2 mg by mouth 2 (two) times daily as needed. 01/04/17  Yes [provider]  traMADol (ULTRAM) 50 MG tablet Take 1 tablet (50 mg total) by mouth every 6 (six) hours as needed. 05/06/18  Yes Ward, Ozella Almond, PA-C  albuterol (PROVENTIL HFA;VENTOLIN HFA) 108 (90 BASE) MCG/ACT inhaler Inhale 2 puffs into the lungs every 6 (six) hours as needed. For shortness of breath    [provider]  albuterol (PROVENTIL) (2.5 MG/3ML) 0.083% nebulizer solution Take 2.5 mg by nebulization every 6 (six) hours as needed for wheezing or shortness of breath.    [provider]  cilostazol (PLETAL) 50 MG tablet Take 50 mg by mouth 2 (two) times daily.  01/18/16   [provider]  diclofenac sodium (VOLTAREN) 1 % GEL Apply 2 g topically 4 (four) times daily.  05/21/18   [provider]  ferrous sulfate 325 (65 FE) MG tablet Take 325 mg by mouth daily with breakfast.    [provider]  folic acid (FOLVITE) 1 MG tablet Take 1 tablet (1 mg total) by mouth daily. 08/18/15   Hongalgi, Lenis Dickinson, MD  furosemide (LASIX) 20 MG tablet Take 1 tablet  (20 mg total) by mouth daily. 08/18/15   Hongalgi, Lenis Dickinson, MD  glimepiride (AMARYL) 4 MG tablet Take 4 mg by mouth daily.  03/27/14   [provider]  HYDROcodone-acetaminophen (NORCO/VICODIN) 5-325 MG tablet Take 1-2 tablets by mouth every 6 (six) hours as needed for severe pain. Patient not taking: Reported on 08/20/2018 01/24/17   Little, Wenda Overland, MD  lisinopril (PRINIVIL,ZESTRIL) 5 MG tablet Take 5 mg by mouth daily. 01/18/16   [provider]  magnesium oxide (MAG-OX) 400 MG tablet Take 400 mg by mouth daily.    [provider]  methocarbamol (ROBAXIN) 500 MG tablet Take 1 tablet (500 mg total) by mouth every 8 (eight) hours as needed for muscle spasms. 02/17/16   Mesner, Corene Cornea, MD  OXYGEN-HELIUM IN Inhale 2 L into the lungs at bedtime. 2 liters  nightly    [provider]  thiamine 100 MG tablet Take 1 tablet (100 mg total) by mouth daily. 08/18/15   Modena Jansky, MD  triamcinolone ointment (KENALOG) 0.1 %  05/21/18   [provider]    Scheduled Meds: . allopurinol  100 mg Oral Daily  . folic acid  1 mg Oral Daily  . insulin aspart  0-5 Units Subcutaneous QHS  . insulin aspart  0-9 Units Subcutaneous TID WC  . insulin glargine  15 Units Subcutaneous Q2200  . magnesium oxide  400 mg Oral Daily  . metoprolol tartrate  25 mg Oral BID  . simvastatin  10 mg Oral Daily  . sodium chloride flush  3 mL Intravenous Q12H  . thiamine  100 mg Oral Daily   Continuous Infusions: . sodium chloride    . sodium chloride 75 mL/hr at 08/29/18 0148  . pantoprozole (PROTONIX) infusion 8 mg/hr (08/29/18 0147)   PRN Meds:.sodium chloride, acetaminophen **OR** acetaminophen, albuterol, hydrALAZINE, HYDROcodone-acetaminophen, methocarbamol, ondansetron **OR** ondansetron (ZOFRAN) IV, polyethylene glycol, sodium chloride flush, traZODone  Allergies as of 08/28/2018 - Review Complete 08/20/2018  Allergen Reaction Noted  . Compazine [prochlorperazine  edisylate] Other (See Comments) 08/15/2015  . Penicillins Hives 01/28/2011  . Phenergan [promethazine hcl] Other (See Comments) 08/15/2015  . Lipitor [atorvastatin] Swelling 04/24/2014    Family History  Problem Relation Age of Onset  . Cancer Brother        heent ca  . Cancer Sister        liver ca    Social History   Socioeconomic History  . Marital status: Legally Separated    Spouse name: Not on file  . Number of children: Not on file  . Years of education: Not on file  . Highest education level: Not on file  Occupational History  . Not on file  Social Needs  . Financial resource strain: Not on file  . Food insecurity:    Worry: Not on file    Inability: Not on file  . Transportation needs:    Medical: Not on file    Non-medical: Not on file  Tobacco Use  . Smoking status: Current Some Day Smoker    Packs/day: 0.50    Years: 30.00    Pack years: 15.00    Types: Cigarettes    Last attempt to quit: 11/06/2005    Years since quitting: 12.8  . Smokeless tobacco: Never Used  Substance and Sexual Activity  . Alcohol use: No    Comment: stopped 2005  . Drug use: No  . Sexual activity: Not on file  Lifestyle  . Physical activity:    Days per week: Not on file    Minutes per session: Not on file  . Stress: Not on file  Relationships  . Social connections:    Talks on phone: Not on file    Gets together: Not on file    Attends religious service: Not on file    Active member of club or organization: Not on file    Attends meetings of clubs or organizations: Not on file    Relationship status: Not on file  . Intimate partner violence:    Fear of current or ex partner: Not on file    Emotionally abused: Not on file    Physically abused: Not on file    Forced sexual activity: Not on file  Other Topics Concern  . Not on file  Social History Narrative  . Not on  file    Review of Systems: Review of Systems  Constitutional: Negative for chills and fever.   HENT: Negative for hearing loss and tinnitus.   Eyes: Negative for blurred vision and double vision.  Respiratory: Positive for cough and sputum production. Negative for hemoptysis.   Cardiovascular: Negative for chest pain and palpitations.  Gastrointestinal: Positive for blood in stool. Negative for abdominal pain, constipation, diarrhea, heartburn, nausea and vomiting.  Genitourinary: Negative for dysuria and urgency.  Musculoskeletal: Negative for myalgias and neck pain.  Skin: Negative for itching and rash.  Neurological: Negative for seizures and weakness.  Endo/Heme/Allergies: Does not bruise/bleed easily.  Psychiatric/Behavioral: Negative for hallucinations and suicidal ideas.    Physical Exam: Vital signs: Vitals:   08/29/18 0155 08/29/18 0345  BP: (!) 109/54 125/77  Pulse: 94 92  Resp: 18 18  Temp: 98.7 F (37.1 C) 98.3 F (36.8 C)  SpO2: 100% 100%   Last BM Date: 08/28/18 Physical Exam  Constitutional: She is oriented to person, place, and time. She appears well-developed and well-nourished.  HENT:  Head: Normocephalic and atraumatic.  Mouth/Throat: Oropharynx is clear and moist. No oropharyngeal exudate.  Eyes: EOM are normal. No scleral icterus.  Neck: Normal range of motion. Neck supple. No thyromegaly present.  Cardiovascular: Normal rate, regular rhythm and normal heart sounds.  Pulmonary/Chest: Effort normal. No respiratory distress.  Occasional rhonchi  Abdominal: Soft. Bowel sounds are normal. She exhibits no distension. There is no abdominal tenderness. There is no rebound.  Scar marks from previous surgeries noted  Musculoskeletal: Normal range of motion.        General: No edema.  Neurological: She is alert and oriented to person, place, and time.  Skin: Skin is dry. No erythema.  Psychiatric: She has a normal mood and affect. Judgment and thought content normal.  Nursing note and vitals reviewed.   GI:  Lab Results: Recent Labs     08/28/18 1301 08/28/18 1311 08/28/18 1700 08/28/18 2358  WBC 13.4*  --  10.5 15.6*  HGB 8.5* 9.9* 6.1* 9.6*  HCT 29.0* 29.0* 20.5* 32.5*  PLT 341  --  270 329   BMET Recent Labs    08/28/18 1301 08/28/18 1311  NA 134* 133*  K 5.5* 5.5*  CL 101 103  CO2 24  --   GLUCOSE 416* 428*  BUN 31* 36*  CREATININE 1.83* 1.70*  CALCIUM 9.0  --    LFT Recent Labs    08/28/18 1301  PROT 6.4*  ALBUMIN 2.2*  AST 27  ALT 11  ALKPHOS 207*  BILITOT 0.4   PT/INR Recent Labs    08/28/18 1410  LABPROT 14.1  INR 1.10     Studies/Results: Ct Head Wo Contrast  Result Date: 08/28/2018 CLINICAL DATA:  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 COMPARISON:  Head CT August 14, 2015 and brain MRI August 14, 2015 FINDINGS: Brain: There is mild age related volume loss. There is no intracranial mass, hemorrhage, extra-axial fluid collection, or midline shift. There is slight small vessel disease in the centra semiovale bilaterally. No acute infarct evident. Vascular: There is no hyperdense vessel. There is calcification in each carotid siphon region. Skull: Bony calvarium appears intact, although bones are osteoporotic. Sinuses/Orbits: There is mucosal thickening in several ethmoid air cells. Orbits appear symmetric bilaterally. Other: Mastoid air cells are clear. There is debris in each external auditory  canal. IMPRESSION: Age related volume loss with slight periventricular small vessel disease. No mass or hemorrhage. No evident acute infarct. Foci of arterial vascular calcification noted. Mucosal thickening in several ethmoid air cells. Probable cerumen in each external auditory canal. Bones osteoporotic. Electronically Signed   By: Lowella Grip III M.D.   On: 08/28/2018 15:08   Dg Chest Port 1 View  Result Date: 08/28/2018 CLINICAL DATA:  Bloody  stool.  Hematemesis. EXAM: PORTABLE CHEST 1 VIEW COMPARISON:  08/20/2018. FINDINGS: Mediastinum and hilar structures normal. Stable coarse interstitial markings again noted. No acute infiltrate. No pleural effusion or pneumothorax. Degenerative changes and scoliosis thoracic spine. Diffuse osteopenia. IMPRESSION: Stable chronic change again noted.  No acute abnormality identified. Electronically Signed   By: Marcello Moores  Register   On: 08/28/2018 13:20    Impression/Plan: -GI bleed with maroon-colored blood mixed with the stool.  Patient with history of ulcer disease and questionable history of cirrhosis. -Status post right hemicolectomy for large villous adenoma of descending colon. -History of peripheral vascular disease.  Aspirin currently on hold.  Recommendations -------------------------- -Change PPI to IV twice daily. -Plan for EGD tomorrow.  If EGD negative, consider inpatient colonoscopy.  Records from [facility] reviewed.  She has not had follow-up EGD and colonoscopy since 2014 which was recommended. -Monitor H&H.  Transfuse as needed to keep hemoglobin around 8. -GI will follow.  Okay to have clear liquids diet today.  N.p.o. past midnight  Risks (bleeding, infection, bowel perforation that could require surgery, sedation-related changes in cardiopulmonary systems), benefits (identification and possible treatment of source of symptoms, exclusion of certain causes of symptoms), and alternatives (watchful waiting, radiographic imaging studies, empiric medical treatment)  were explained to patient/family in detail and patient wishes to proceed.    LOS: 1 day   Otis Brace  MD, FACP 08/29/2018, 7:25 AM  Contact #  [phone]

## 2018-08-29 NOTE — H&P (View-Only) (Signed)
Referring Provider:  EDP Primary Care Physician:  Nolene Ebbs, MD Primary Gastroenterologist:  Dr.  Michail Sermon   Reason for Consultation: GI bleed  HPI: [name] is a 73 y.o. female with past medical history of chronic anemia, history of possible cirrhosis (CT scan in April 2019 did not mention cirrhosis), history of ulcer disease, history of peripheral vascular disease and history of COPD presented to the emergency room with GI bleed.  Her hemoglobin was around 8.5 which is her baseline on initial presentation which subsequently dropped to 6.1.  She has received blood transfusion and her hemoglobin is now up to 10.3.  Patient seen and examined at bedside.  According to patient, she started seeing maroon-colored blood in the stool yesterday.  She denies any vomiting of blood.  She denies abdominal pain.  Complaining of cough and occasional sputum production.  Denies weight loss.  Denies diarrhea or constipation.   Previous GI work-up CT abdomen pelvis with contrast in April 2019 showed vague hypodensity within the medial right hepatic lobe near the IVC measuring 2.5 cm, changes of chronic pancreatitis, indeterminant lesion within the mid left kidney as well as changes of right hemicolectomy.  MRI abdomen was recommended for follow-up.   EGD in December 2014 by Dr. Michail Sermon showed mild duodenitis otherwise normal.  Repeat was recommended in 2 years  Colonoscopy in December 2014 showed normal ileocolonic anastomosis and diverticulosis.  Repeat was recommended in 3 years.  Past Medical History:  Diagnosis Date  . Acute venous embolism and thrombosis of deep vessels of proximal lower extremity ([place])   . Alcoholic cirrhosis ([place])   . Anemia   . Anxiety   . Arthritis   . Asthma   . Back pain   . Blood transfusion few years ago  . Cellulitis    bilateral lower extremities  . CHF (congestive heart failure) ([place]) 10/13   grade 1 diastolic dysfunction, Nl LVF  . Chronic diastolic  congestive heart failure ([place])   . Chronic obstructive pulmonary disease (COPD) ([place])   . Colon polyps   . Depression   . Diabetes mellitus    insulin dep   . Gastroesophageal reflux   . GI bleeding 08/28/2018  . Glaucoma    left  . Gout   . Hepatitis    alcoholic hepatitis  . History of GI diverticular bleed   . Hypertension   . Leg swelling   . On home oxygen therapy    "2L just at night" (08/28/2018)  . Pneumonia    history of  . PVD (peripheral vascular disease) ([place]) 2009   bilat iliac stenting  . Reflux esophagitis   . Villous adenoma of colon 05/10/11    Past Surgical History:  Procedure Laterality Date  . ABDOMINAL HYSTERECTOMY  yrs ago  . CATARACT EXTRACTION Right yrs ago  . COLONOSCOPY    . COLONOSCOPY WITH PROPOFOL N/A 08/06/2013   Procedure: COLONOSCOPY WITH PROPOFOL;  Surgeon: Lear Ng, MD;  Location: [facility];  Service: Endoscopy;  Laterality: N/A;  . ESOPHAGOGASTRODUODENOSCOPY (EGD) WITH PROPOFOL N/A 08/06/2013   Procedure: ESOPHAGOGASTRODUODENOSCOPY (EGD) WITH PROPOFOL;  Surgeon: Lear Ng, MD;  Location: [facility];  Service: Endoscopy;  Laterality: N/A;  . LAPAROSCOPIC ASSISSTED TOTAL COLECTOMY W/ J-POUCH  04/22/11  . VENTRAL HERNIA REPAIR  06/13/2012   Procedure: LAPAROSCOPIC VENTRAL HERNIA;  Surgeon: Adin Hector, MD;  Location: [facility];  Service: General;  Laterality: N/A;  Laparoscopic Assisted Ventral Hernia with Mesh    Prior  to Admission medications   Medication Sig Start Date End Date Taking? Authorizing Provider  allopurinol (ZYLOPRIM) 100 MG tablet Take 100 mg by mouth daily.  01/18/16  Yes [provider]  aspirin 81 MG EC tablet Take 81 mg by mouth daily.     Yes [provider]  ergocalciferol (VITAMIN D2) 50000 UNITS capsule Take 50,000 Units by mouth every Monday. On Monday   Yes [provider]  fluticasone (FLONASE) 50 MCG/ACT nasal spray Place 2 sprays into the nose daily as needed for  allergies.    Yes [provider]  LANTUS SOLOSTAR 100 UNIT/ML Solostar Pen Inject 30 Units as directed every morning.  01/18/16  Yes [provider]  metoprolol (LOPRESSOR) 50 MG tablet Take 50 mg by mouth daily. 01/18/16  Yes [provider]  montelukast (SINGULAIR) 10 MG tablet Take 10 mg by mouth at bedtime.    Yes [provider]  naproxen (NAPROSYN) 375 MG tablet Take 1 tablet (375 mg total) by mouth 2 (two) times daily as needed. 05/06/18  Yes Ward, Ozella Almond, PA-C  omeprazole (PRILOSEC) 40 MG capsule Take 40 mg by mouth daily.    Yes [provider]  simvastatin (ZOCOR) 10 MG tablet Take 10 mg by mouth daily.  11/30/14  Yes [provider]  sitaGLIPtin (JANUVIA) 100 MG tablet Take 100 mg by mouth daily.  03/27/14  Yes [provider]  tiZANidine (ZANAFLEX) 2 MG tablet Take 2 mg by mouth 2 (two) times daily as needed. 01/04/17  Yes [provider]  traMADol (ULTRAM) 50 MG tablet Take 1 tablet (50 mg total) by mouth every 6 (six) hours as needed. 05/06/18  Yes Ward, Ozella Almond, PA-C  albuterol (PROVENTIL HFA;VENTOLIN HFA) 108 (90 BASE) MCG/ACT inhaler Inhale 2 puffs into the lungs every 6 (six) hours as needed. For shortness of breath    [provider]  albuterol (PROVENTIL) (2.5 MG/3ML) 0.083% nebulizer solution Take 2.5 mg by nebulization every 6 (six) hours as needed for wheezing or shortness of breath.    [provider]  cilostazol (PLETAL) 50 MG tablet Take 50 mg by mouth 2 (two) times daily.  01/18/16   [provider]  diclofenac sodium (VOLTAREN) 1 % GEL Apply 2 g topically 4 (four) times daily.  05/21/18   [provider]  ferrous sulfate 325 (65 FE) MG tablet Take 325 mg by mouth daily with breakfast.    [provider]  folic acid (FOLVITE) 1 MG tablet Take 1 tablet (1 mg total) by mouth daily. 08/18/15   Hongalgi, Lenis Dickinson, MD  furosemide (LASIX) 20 MG tablet Take 1 tablet  (20 mg total) by mouth daily. 08/18/15   Hongalgi, Lenis Dickinson, MD  glimepiride (AMARYL) 4 MG tablet Take 4 mg by mouth daily.  03/27/14   [provider]  HYDROcodone-acetaminophen (NORCO/VICODIN) 5-325 MG tablet Take 1-2 tablets by mouth every 6 (six) hours as needed for severe pain. Patient not taking: Reported on 08/20/2018 01/24/17   Little, Wenda Overland, MD  lisinopril (PRINIVIL,ZESTRIL) 5 MG tablet Take 5 mg by mouth daily. 01/18/16   [provider]  magnesium oxide (MAG-OX) 400 MG tablet Take 400 mg by mouth daily.    [provider]  methocarbamol (ROBAXIN) 500 MG tablet Take 1 tablet (500 mg total) by mouth every 8 (eight) hours as needed for muscle spasms. 02/17/16   Mesner, Corene Cornea, MD  OXYGEN-HELIUM IN Inhale 2 L into the lungs at bedtime. 2 liters  nightly    [provider]  thiamine 100 MG tablet Take 1 tablet (100 mg total) by mouth daily. 08/18/15   Modena Jansky, MD  triamcinolone ointment (KENALOG) 0.1 %  05/21/18   [provider]    Scheduled Meds: . allopurinol  100 mg Oral Daily  . folic acid  1 mg Oral Daily  . insulin aspart  0-5 Units Subcutaneous QHS  . insulin aspart  0-9 Units Subcutaneous TID WC  . insulin glargine  15 Units Subcutaneous Q2200  . magnesium oxide  400 mg Oral Daily  . metoprolol tartrate  25 mg Oral BID  . simvastatin  10 mg Oral Daily  . sodium chloride flush  3 mL Intravenous Q12H  . thiamine  100 mg Oral Daily   Continuous Infusions: . sodium chloride    . sodium chloride 75 mL/hr at 08/29/18 0148  . pantoprozole (PROTONIX) infusion 8 mg/hr (08/29/18 0147)   PRN Meds:.sodium chloride, acetaminophen **OR** acetaminophen, albuterol, hydrALAZINE, HYDROcodone-acetaminophen, methocarbamol, ondansetron **OR** ondansetron (ZOFRAN) IV, polyethylene glycol, sodium chloride flush, traZODone  Allergies as of 08/28/2018 - Review Complete 08/20/2018  Allergen Reaction Noted  . Compazine [prochlorperazine  edisylate] Other (See Comments) 08/15/2015  . Penicillins Hives 01/28/2011  . Phenergan [promethazine hcl] Other (See Comments) 08/15/2015  . Lipitor [atorvastatin] Swelling 04/24/2014    Family History  Problem Relation Age of Onset  . Cancer Brother        heent ca  . Cancer Sister        liver ca    Social History   Socioeconomic History  . Marital status: Legally Separated    Spouse name: Not on file  . Number of children: Not on file  . Years of education: Not on file  . Highest education level: Not on file  Occupational History  . Not on file  Social Needs  . Financial resource strain: Not on file  . Food insecurity:    Worry: Not on file    Inability: Not on file  . Transportation needs:    Medical: Not on file    Non-medical: Not on file  Tobacco Use  . Smoking status: Current Some Day Smoker    Packs/day: 0.50    Years: 30.00    Pack years: 15.00    Types: Cigarettes    Last attempt to quit: 11/06/2005    Years since quitting: 12.8  . Smokeless tobacco: Never Used  Substance and Sexual Activity  . Alcohol use: No    Comment: stopped 2005  . Drug use: No  . Sexual activity: Not on file  Lifestyle  . Physical activity:    Days per week: Not on file    Minutes per session: Not on file  . Stress: Not on file  Relationships  . Social connections:    Talks on phone: Not on file    Gets together: Not on file    Attends religious service: Not on file    Active member of club or organization: Not on file    Attends meetings of clubs or organizations: Not on file    Relationship status: Not on file  . Intimate partner violence:    Fear of current or ex partner: Not on file    Emotionally abused: Not on file    Physically abused: Not on file    Forced sexual activity: Not on file  Other Topics Concern  . Not on file  Social History Narrative  . Not on  file    Review of Systems: Review of Systems  Constitutional: Negative for chills and fever.   HENT: Negative for hearing loss and tinnitus.   Eyes: Negative for blurred vision and double vision.  Respiratory: Positive for cough and sputum production. Negative for hemoptysis.   Cardiovascular: Negative for chest pain and palpitations.  Gastrointestinal: Positive for blood in stool. Negative for abdominal pain, constipation, diarrhea, heartburn, nausea and vomiting.  Genitourinary: Negative for dysuria and urgency.  Musculoskeletal: Negative for myalgias and neck pain.  Skin: Negative for itching and rash.  Neurological: Negative for seizures and weakness.  Endo/Heme/Allergies: Does not bruise/bleed easily.  Psychiatric/Behavioral: Negative for hallucinations and suicidal ideas.    Physical Exam: Vital signs: Vitals:   08/29/18 0155 08/29/18 0345  BP: (!) 109/54 125/77  Pulse: 94 92  Resp: 18 18  Temp: 98.7 F (37.1 C) 98.3 F (36.8 C)  SpO2: 100% 100%   Last BM Date: 08/28/18 Physical Exam  Constitutional: She is oriented to person, place, and time. She appears well-developed and well-nourished.  HENT:  Head: Normocephalic and atraumatic.  Mouth/Throat: Oropharynx is clear and moist. No oropharyngeal exudate.  Eyes: EOM are normal. No scleral icterus.  Neck: Normal range of motion. Neck supple. No thyromegaly present.  Cardiovascular: Normal rate, regular rhythm and normal heart sounds.  Pulmonary/Chest: Effort normal. No respiratory distress.  Occasional rhonchi  Abdominal: Soft. Bowel sounds are normal. She exhibits no distension. There is no abdominal tenderness. There is no rebound.  Scar marks from previous surgeries noted  Musculoskeletal: Normal range of motion.        General: No edema.  Neurological: She is alert and oriented to person, place, and time.  Skin: Skin is dry. No erythema.  Psychiatric: She has a normal mood and affect. Judgment and thought content normal.  Nursing note and vitals reviewed.   GI:  Lab Results: Recent Labs     08/28/18 1301 08/28/18 1311 08/28/18 1700 08/28/18 2358  WBC 13.4*  --  10.5 15.6*  HGB 8.5* 9.9* 6.1* 9.6*  HCT 29.0* 29.0* 20.5* 32.5*  PLT 341  --  270 329   BMET Recent Labs    08/28/18 1301 08/28/18 1311  NA 134* 133*  K 5.5* 5.5*  CL 101 103  CO2 24  --   GLUCOSE 416* 428*  BUN 31* 36*  CREATININE 1.83* 1.70*  CALCIUM 9.0  --    LFT Recent Labs    08/28/18 1301  PROT 6.4*  ALBUMIN 2.2*  AST 27  ALT 11  ALKPHOS 207*  BILITOT 0.4   PT/INR Recent Labs    08/28/18 1410  LABPROT 14.1  INR 1.10     Studies/Results: Ct Head Wo Contrast  Result Date: 08/28/2018 CLINICAL DATA:  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 COMPARISON:  Head CT August 14, 2015 and brain MRI August 14, 2015 FINDINGS: Brain: There is mild age related volume loss. There is no intracranial mass, hemorrhage, extra-axial fluid collection, or midline shift. There is slight small vessel disease in the centra semiovale bilaterally. No acute infarct evident. Vascular: There is no hyperdense vessel. There is calcification in each carotid siphon region. Skull: Bony calvarium appears intact, although bones are osteoporotic. Sinuses/Orbits: There is mucosal thickening in several ethmoid air cells. Orbits appear symmetric bilaterally. Other: Mastoid air cells are clear. There is debris in each external auditory  canal. IMPRESSION: Age related volume loss with slight periventricular small vessel disease. No mass or hemorrhage. No evident acute infarct. Foci of arterial vascular calcification noted. Mucosal thickening in several ethmoid air cells. Probable cerumen in each external auditory canal. Bones osteoporotic. Electronically Signed   By: Lowella Grip III M.D.   On: 08/28/2018 15:08   Dg Chest Port 1 View  Result Date: 08/28/2018 CLINICAL DATA:  Bloody  stool.  Hematemesis. EXAM: PORTABLE CHEST 1 VIEW COMPARISON:  08/20/2018. FINDINGS: Mediastinum and hilar structures normal. Stable coarse interstitial markings again noted. No acute infiltrate. No pleural effusion or pneumothorax. Degenerative changes and scoliosis thoracic spine. Diffuse osteopenia. IMPRESSION: Stable chronic change again noted.  No acute abnormality identified. Electronically Signed   By: Marcello Moores  Register   On: 08/28/2018 13:20    Impression/Plan: -GI bleed with maroon-colored blood mixed with the stool.  Patient with history of ulcer disease and questionable history of cirrhosis. -Status post right hemicolectomy for large villous adenoma of descending colon. -History of peripheral vascular disease.  Aspirin currently on hold.  Recommendations -------------------------- -Change PPI to IV twice daily. -Plan for EGD tomorrow.  If EGD negative, consider inpatient colonoscopy.  Records from [facility] reviewed.  She has not had follow-up EGD and colonoscopy since 2014 which was recommended. -Monitor H&H.  Transfuse as needed to keep hemoglobin around 8. -GI will follow.  Okay to have clear liquids diet today.  N.p.o. past midnight  Risks (bleeding, infection, bowel perforation that could require surgery, sedation-related changes in cardiopulmonary systems), benefits (identification and possible treatment of source of symptoms, exclusion of certain causes of symptoms), and alternatives (watchful waiting, radiographic imaging studies, empiric medical treatment)  were explained to patient/family in detail and patient wishes to proceed.    LOS: 1 day   Otis Brace  MD, FACP 08/29/2018, 7:25 AM  Contact #  [phone]

## 2018-08-29 NOTE — Plan of Care (Signed)
  Problem: Education: Goal: Knowledge of General Education information will improve Description Including pain rating scale, medication(s)/side effects and non-pharmacologic comfort measures Outcome: Progressing   

## 2018-08-29 NOTE — Progress Notes (Signed)
Patient Demographics:    [name], is a 73 y.o. female, DOB - 03-Dec-1945, FAO:[id number]  Admit date - 08/28/2018   Admitting Physician Simona Rocque Denton Brick, MD  Outpatient Primary MD for the patient is Nolene Ebbs, MD  LOS - 1   No chief complaint on file.       Subjective:    [name] today has no fevers, no emesis,  No chest pain, no further bloody BMs, abdominal discomfort is improving, patient continues to have fatigue, dyspnea on exertion and dizziness....    Assessment  & Plan :    Principal Problem:   GI bleed Active Problems:   Anemia   COPD (chronic obstructive pulmonary disease) (HCC)   Sleep apnea   Controlled diabetes mellitus type 2 with complications (HCC)   Acute renal failure superimposed on stage 3 chronic kidney disease (HCC)   PVD, LEIA/LIIA and RCIA pta 7/09- ABIs 0.68 and 0.69 Feb 2014   PUD (peptic ulcer disease) GI bleed in past (2011)   Cirrhosis of liver ([place])   Essential hypertension   AKI (acute kidney injury) ([place])  Brief summary 73 y.o. female with past medical history relevant for peptic ulcer disease, with prior history of diverticular bleed as well, chronic anemia, reformed alcoholic (last Etoh 2 yrs ago), reformed smoker, CKD III, PAD, alcoholic liver disease, , h/o HFpEF  admitted on 08/28/2018 with fatigue, dizziness and concerns about blood with stools, found to have symptomatic anemia, received 1 unit of packed cells   Plan:-  1)Acute GI bleed--- hemoglobin up to 10.3 after transfusion of 1 unit of PRBcs suspect inaccurate labs/CBC overnight,  patient reports bloody stools , fatigue and dizziness-- c/n  Protonix, patient with history of peptic ulcer disease and history of prior diverticular bleed, ???  If esophageal varices given history of alcoholic liver cirrhosis,    [facility] consult from Dr. Alessandra Bevels appreciated, for EGD on 08/30/2018.Marland Kitchen Hold  naproxen,, hold aspirin and Pletal,   2)AKI with hyperkalemia----acute kidney injury on CKD stage - III , creatinine on admission=1.83  ,   baseline creatinine = 1.5 to 1.6   , improving with hydration, creatinine now is 1.36,  renally adjust medications, avoid nephrotoxic agents/dehydration/hypotension , c/n to hold lisinopril,    3)DM2-no recent A1c, continue reduced dose Lantus  15 units from 30 units as patient will be on clear liquids and then  n.p.o. for endoluminal evaluation, okay to use sliding scale.  c/n to Hold Amaryl and Januvia  4)HFpEF--- last known EF 55 to 60%, continue metoprolol to 25 mg p.o. twice daily, dyspnea on exertion and dizziness most likely secondary to anemia and GI bleed  5) acute on chronic anemia--- secondary to #1 above with acute blood loss from GI source-treat as above #1  6)Alcoholic liver disease/Cirrhosis --- INR is 1.1, avoid hepatotoxic agents, last EGD in 2014 without esophageal varices, plan for EGD on 08/30/2018  7)PAD with bilateral iliac stents--- she is a reformed smoker, aspirin and Pletal on hold due to #1 above.... Resume when okay with GI  Disposition/Need for in-Hospital Stay- patient unable to be discharged at this time due to concerns about GI bleed requiring endoluminal evaluation and possible further transfusion--- patient continues to have fatigue, dyspnea on exertion and  dizziness....     Code Status : Full  Family Communication:   na   Disposition Plan  : ?? Home   Consults  :  Gi  DVT Prophylaxis  :   - SCDs (Gi Bleed)  Lab Results  Component Value Date   PLT 323 08/29/2018    Inpatient Medications  Scheduled Meds: . allopurinol  100 mg Oral Daily  . folic acid  1 mg Oral Daily  . insulin aspart  0-5 Units Subcutaneous QHS  . insulin aspart  0-9 Units Subcutaneous TID WC  . insulin glargine  15 Units Subcutaneous Q2200  . magnesium oxide  400 mg Oral Daily  . metoprolol tartrate  25 mg Oral BID  . simvastatin   10 mg Oral Daily  . sodium chloride flush  3 mL Intravenous Q12H  . thiamine  100 mg Oral Daily   Continuous Infusions: . sodium chloride    . sodium chloride 75 mL/hr at 08/29/18 1647  . pantoprozole (PROTONIX) infusion 8 mg/hr (08/29/18 1411)   PRN Meds:.sodium chloride, acetaminophen **OR** acetaminophen, albuterol, hydrALAZINE, HYDROcodone-acetaminophen, methocarbamol, ondansetron **OR** ondansetron (ZOFRAN) IV, polyethylene glycol, sodium chloride flush, traZODone    Anti-infectives (From admission, onward)   None        Objective:   Vitals:   08/29/18 0345 08/29/18 0409 08/29/18 0928 08/29/18 1631  BP: 125/77  (!) 165/91 (!) 162/81  Pulse: 92  92 79  Resp: 18     Temp: 98.3 F (36.8 C)  98.9 F (37.2 C) 98.5 F (36.9 C)  TempSrc: Oral  Oral Oral  SpO2: 100%  99% 99%  Weight:  58.1 kg    Height:  5' (1.524 m)      Wt Readings from Last 3 Encounters:  08/29/18 58.1 kg  08/20/18 57.2 kg  03/09/18 61.2 kg     Intake/Output Summary (Last 24 hours) at 08/29/2018 1725 Last data filed at 08/29/2018 1524 Gross per 24 hour  Intake 3221 ml  Output 2500 ml  Net 721 ml     Physical Exam Patient is examined daily including today on 08/29/2018 , exams remain the same as of yesterday except that has changed   Gen:- Awake Alert,  In no apparent distress  HEENT:- [place].AT, No sclera icterus Neck-Supple Neck,No JVD,.  Lungs-  CTAB , fair symmetrical air movement CV- S1, S2 normal, regular  Abd-  +ve B.Sounds, Abd Soft, No tenderness,    Extremity/Skin:- No  edema, pedal pulses present  Psych-affect is appropriate, oriented x3 Neuro-no new focal deficits, no tremors   Data Review:   Micro Results No results found for this or any previous visit (from the past 240 hour(s)).  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 COMPARISON:  Head CT August 14, 2015 and brain MRI August 14, 2015 FINDINGS: Brain: There is mild age related volume loss. There is no intracranial mass, hemorrhage, extra-axial fluid collection, or midline shift. There is slight small vessel disease in the centra semiovale bilaterally. No acute infarct evident. Vascular: There is no hyperdense vessel. There is calcification in each carotid siphon region. Skull: Bony calvarium appears intact, although bones are osteoporotic. Sinuses/Orbits: There is mucosal thickening in several ethmoid air cells. Orbits appear symmetric bilaterally. Other: Mastoid air cells are clear. There is debris in each external  auditory canal. IMPRESSION: Age related volume loss with slight periventricular small vessel disease. No mass or hemorrhage. No evident acute infarct. Foci of arterial vascular calcification noted. Mucosal thickening in several ethmoid air cells. Probable cerumen in each external auditory canal. Bones osteoporotic. Electronically Signed   By: Lowella Grip III M.D.   On: 08/28/2018 15:08   Dg Chest Port 1 View  Result Date: 08/28/2018 CLINICAL DATA:  Bloody stool.  Hematemesis. EXAM: PORTABLE CHEST 1 VIEW COMPARISON:  08/20/2018. FINDINGS: Mediastinum and hilar structures normal. Stable coarse interstitial markings again noted. No acute infiltrate. No pleural effusion or pneumothorax. Degenerative changes and scoliosis thoracic spine. Diffuse osteopenia. IMPRESSION: Stable chronic change again noted.  No acute abnormality identified. Electronically Signed   By: Marcello Moores  Register   On: 08/28/2018 13:20   Dg Chest Portable 1 View  Result Date: 08/20/2018 CLINICAL DATA:  Tachycardia. EXAM: PORTABLE CHEST 1 VIEW COMPARISON:  Chest x-ray dated November 30, 2017. FINDINGS: The heart size and mediastinal contours are within normal limits. Chronically coarsened interstitial markings are similar  to prior studies. Unchanged bibasilar scarring. No focal consolidation, pleural effusion, or pneumothorax. No acute osseous abnormality. IMPRESSION: Stable chronic changes.  No active disease. Electronically Signed   By: Titus Dubin M.D.   On: 08/20/2018 17:39     CBC Recent Labs  Lab 08/28/18 1301 08/28/18 1311 08/28/18 1700 08/28/18 2358 08/29/18 0714  WBC 13.4*  --  10.5 15.6* 16.5*  HGB 8.5* 9.9* 6.1* 9.6* 10.3*  HCT 29.0* 29.0* 20.5* 32.5* 33.5*  PLT 341  --  270 329 323  MCV 86.8  --  86.9 85.3 83.5  MCH 25.4*  --  25.8* 25.2* 25.7*  MCHC 29.3*  --  29.8* 29.5* 30.7  RDW 15.7*  --  15.7* 15.9* 15.3  LYMPHSABS 1.9  --  2.2  --   --   MONOABS 0.9  --  0.9  --   --   EOSABS 0.1  --  0.0  --   --   BASOSABS 0.0  --  0.0  --   --     Chemistries  Recent Labs  Lab 08/28/18 1301 08/28/18 1311 08/29/18 0714  NA 134* 133* 140  K 5.5* 5.5* 4.3  CL 101 103 109  CO2 24  --  24  GLUCOSE 416* 428* 88  BUN 31* 36* 23  CREATININE 1.83* 1.70* 1.36*  CALCIUM 9.0  --  8.5*  AST 27  --   --   ALT 11  --   --   ALKPHOS 207*  --   --   BILITOT 0.4  --   --    ------------------------------------------------------------------------------------------------------------------ No results for input(s): CHOL, HDL, LDLCALC, TRIG, CHOLHDL, LDLDIRECT in the last 72 hours.  Lab Results  Component Value Date   HGBA1C 8.6 (H) 08/10/2015   Coagulation profile Recent Labs  Lab 08/28/18 1410  INR 1.10    Roxan Hockey M.D on 08/29/2018 at 5:25 PM  Pager---[phone] Go to [email or web address] - password [email or web address] for contact info  [facility] - Office  [phone]

## 2018-08-30 ENCOUNTER — Observation Stay (HOSPITAL_COMMUNITY): Payer: Medicare Other | Admitting: Certified Registered Nurse Anesthetist

## 2018-08-30 ENCOUNTER — Encounter (HOSPITAL_COMMUNITY)
Admission: EM | Disposition: A | Discharge status: Home Health | Payer: Self-pay | Source: Home / Self Care | Attending: Family Medicine

## 2018-08-30 ENCOUNTER — Encounter (HOSPITAL_COMMUNITY): Payer: Self-pay | Admitting: Certified Registered Nurse Anesthetist

## 2018-08-30 DIAGNOSIS — D649 Anemia, unspecified: Secondary | ICD-10-CM | POA: Diagnosis not present

## 2018-08-30 DIAGNOSIS — I10 Essential (primary) hypertension: Secondary | ICD-10-CM | POA: Diagnosis not present

## 2018-08-30 DIAGNOSIS — N179 Acute kidney failure, unspecified: Secondary | ICD-10-CM | POA: Diagnosis not present

## 2018-08-30 DIAGNOSIS — K922 Gastrointestinal hemorrhage, unspecified: Secondary | ICD-10-CM | POA: Diagnosis not present

## 2018-08-30 HISTORY — PX: ESOPHAGOGASTRODUODENOSCOPY (EGD) WITH PROPOFOL: SHX5813

## 2018-08-30 LAB — GLUCOSE, CAPILLARY
Glucose-Capillary: 80 mg/dL (ref 70–99)
Glucose-Capillary: 97 mg/dL (ref 70–99)
Glucose-Capillary: 251 mg/dL — ABNORMAL HIGH (ref 70–99)
Glucose-Capillary: 103 mg/dL — ABNORMAL HIGH (ref 70–99)

## 2018-08-30 LAB — CBC
HEMATOCRIT: 33.3 % — AB (ref 36.0–46.0)
HEMOGLOBIN: 10.4 g/dL — AB (ref 12.0–15.0)
MCH: 25.9 pg — ABNORMAL LOW (ref 26.0–34.0)
MCHC: 31.2 g/dL (ref 30.0–36.0)
MCV: 83 fL (ref 80.0–100.0)
Platelets: 322 10*3/uL (ref 150–400)
RBC: 4.01 MIL/uL (ref 3.87–5.11)
RDW: 15.8 % — AB (ref 11.5–15.5)
WBC: 14 10*3/uL — ABNORMAL HIGH (ref 4.0–10.5)
nRBC: 0 % (ref 0.0–0.2)

## 2018-08-30 LAB — BASIC METABOLIC PANEL
Anion gap: 7 (ref 5–15)
BUN: 19 mg/dL (ref 8–23)
CALCIUM: 8.2 mg/dL — AB (ref 8.9–10.3)
CO2: 19 mmol/L — ABNORMAL LOW (ref 22–32)
CREATININE: 1.34 mg/dL — AB (ref 0.44–1.00)
Chloride: 110 mmol/L (ref 98–111)
GFR calc Af Amer: 45 mL/min — ABNORMAL LOW (ref >60)
GFR calc non Af Amer: 39 mL/min — ABNORMAL LOW (ref >60)
Glucose, Bld: 89 mg/dL (ref 70–99)
Potassium: 4.5 mmol/L (ref 3.5–5.1)
Sodium: 136 mmol/L (ref 135–145)

## 2018-08-30 SURGERY — ESOPHAGOGASTRODUODENOSCOPY (EGD) WITH PROPOFOL
Anesthesia: Monitor Anesthesia Care

## 2018-08-30 MED ORDER — SODIUM CHLORIDE 0.9 % IV SOLN: INTRAVENOUS | Status: DC

## 2018-08-30 MED ORDER — LABETALOL HCL 5 MG/ML IV SOLN
5.0000 mg | Freq: Once | INTRAVENOUS | Status: AC
  Administered 2018-08-30: 5 mg via INTRAVENOUS

## 2018-08-30 MED ORDER — LABETALOL HCL 5 MG/ML IV SOLN
INTRAVENOUS | Status: AC
  Filled 2018-08-30: qty 4

## 2018-08-30 MED ORDER — PROPOFOL 500 MG/50ML IV EMUL
INTRAVENOUS | Status: DC | PRN
  Administered 2018-08-30: 100 ug/kg/min via INTRAVENOUS

## 2018-08-30 MED ORDER — PROPOFOL 10 MG/ML IV BOLUS
INTRAVENOUS | Status: DC | PRN
  Administered 2018-08-30: 20 mg via INTRAVENOUS

## 2018-08-30 MED ORDER — PEG 3350-KCL-NA BICARB-NACL 420 G PO SOLR
4000.0000 mL | Freq: Once | ORAL | Status: AC
  Administered 2018-08-30: 4000 mL via ORAL
  Filled 2018-08-30: qty 4000

## 2018-08-30 MED ORDER — LACTATED RINGERS IV SOLN
INTRAVENOUS | Status: DC | PRN
  Administered 2018-08-30: 13:00:00 via INTRAVENOUS

## 2018-08-30 SURGICAL SUPPLY — 15 items

## 2018-08-30 NOTE — Op Note (Signed)
[facility] Patient Name: [name] Procedure Date : 08/30/2018 MRN: [id number] Attending MD: [name] , MD Date of Birth: Jan 03, 1946 CSN: [id number] Age: 73 Admit Type: Inpatient Procedure:                Upper GI endoscopy Indications:              Melena Providers:                [name], MD, Carlyn Reichert, RN, Raynelle Bring, RN,                            Cletis Athens, Technician, Janeece Agee, Technician,                            Cherylynn Ridges, Technician, Carver Fila Referring MD:              Medicines:                Monitored Anesthesia Care Complications:            No immediate complications. Estimated blood loss:                            None. Estimated Blood Loss:     Estimated blood loss: none. Procedure:                Pre-Anesthesia Assessment:                           - Prior to the procedure, a History and Physical                            was performed, and patient medications and                            allergies were reviewed. The patient's tolerance of                            previous anesthesia was also reviewed. The risks                            and benefits of the procedure and the sedation                            options and risks were discussed with the patient.                            All questions were answered, and informed consent                            was obtained. Prior Anticoagulants: The patient has                            taken aspirin, last dose was 1 day prior to  procedure. After reviewing the risks and benefits,                            the patient was deemed in satisfactory condition to                            undergo the procedure.                           After obtaining informed consent, the endoscope was                            passed under direct vision. Throughout the                            procedure, the patient's blood pressure, pulse, and                     oxygen saturations were monitored continuously. The                            [id number]-[id number] ([id number] Olympus gastroscope was                            introduced through the mouth, and advanced to the                            second part of duodenum. The upper GI endoscopy was                            accomplished without difficulty. The patient                            tolerated the procedure well. Scope In: Scope Out: Findings:      A 5 cm hiatal hernia was present.      The esophagus was normal.      Localized mildly erythematous mucosa without bleeding was found in the       gastric antrum.      The cardia and gastric fundus were normal on retroflexion.      The examined duodenum was normal. Impression:               - 5 cm hiatal hernia.                           - Normal esophagus.                           - Erythematous mucosa in the antrum.                           - Normal examined duodenum.                           - No specimens collected. Moderate Sedation:      Patient did not receive moderate sedation for this procedure, but  instead received monitored anesthesia care. Recommendation:           - Clear liquid diet today.                           - Continue present medications.                           - Perform a colonoscopy tomorrow. Procedure Code(s):        --- Professional ---                           [phone], Esophagogastroduodenoscopy, flexible,                            transoral; diagnostic, including collection of                            specimen(s) by brushing or washing, when performed                            (separate procedure) Diagnosis Code(s):        --- Professional ---                           K44.9, Diaphragmatic hernia without obstruction or                            gangrene                           K31.89, Other diseases of stomach and duodenum                           K92.1, Melena (includes  Hematochezia) CPT copyright 2018 [employer]. All rights reserved. The codes documented in this report are preliminary and upon coder review may  be revised to meet current compliance requirements. [name], MD 08/30/2018 1:13:52 PM This report has been signed electronically. Number of Addenda: 0

## 2018-08-30 NOTE — Progress Notes (Signed)
Patient Demographics:    [name], is a 73 y.o. female, DOB - May 08, 1946, MMN:[id number]  Admit date - 08/28/2018   Admitting Physician Walida Cajas Denton Brick, MD  Outpatient Primary MD for the patient is Nolene Ebbs, MD  LOS - 1   No chief complaint on file.       Subjective:    [name] today has no fevers, no emesis,  No chest pain, no further bloody BMs, abdominal discomfort is improving, patient continues to have fatigue, dyspnea on exertion and dizziness....    Assessment  & Plan :    Principal Problem:   GI bleed Active Problems:   Anemia   COPD (chronic obstructive pulmonary disease) (HCC)   Sleep apnea   Controlled diabetes mellitus type 2 with complications (HCC)   Acute renal failure superimposed on stage 3 chronic kidney disease (HCC)   PVD, LEIA/LIIA and RCIA pta 7/09- ABIs 0.68 and 0.69 Feb 2014   PUD (peptic ulcer disease) GI bleed in past (2011)   Cirrhosis of liver ([place])   Essential hypertension   AKI (acute kidney injury) [facility])  Brief Summary 73 y.o. female with past medical history relevant for peptic ulcer disease, with prior history of diverticular bleed as well, chronic anemia, reformed alcoholic (last Etoh 2 yrs ago), reformed smoker, CKD III, PAD, alcoholic liver disease, , h/o HFpEF  admitted on 08/28/2018 with fatigue, dizziness and concerns about blood with stools, found to have symptomatic anemia, received 1 unit of packed cells, EGD on 08/30/2018 unremarkable, awaiting colonoscopy on 08/31/2018   Plan:- 1)Acute GI bleed--- hemoglobin up to 10.4 after transfusion of 1 unit of PRBcs suspect inaccurate labs/CBC overnight,  patient reports bloody stools , fatigue and dizziness-- c/n  Protonix, patient with history of peptic ulcer disease and history of prior diverticular bleed,     [facility] consult   appreciated, EGD on 08/30/2018 unremarkable, awaiting colonoscopy on  08/31/2018, Hold naproxen,, hold aspirin and Pletal,   2)AKI with Hyperkalemia----acute kidney injury on CKD stage - III , creatinine on admission=1.83  ,   baseline creatinine = 1.5 to 1.6   , improving with hydration, creatinine now is 1.34,  renally adjust medications, avoid nephrotoxic agents / dehydration / hypotension , c/n to hold lisinopril,    3)DM2-no recent A1c, continue reduced dose Lantus  15 units from 30 units as patient will be on clear liquids and then  n.p.o. for endoluminal evaluation, okay to use sliding scale.  c/n to Hold Amaryl and Januvia, Allow some permissive Hyperglycemia rather than risk life-threatening hypoglycemia in a patient with unreliable oral intake. Use Novolog/Humalog Sliding scale insulin with Accu-Cheks/Fingersticks as ordered  4)HFpEF--- last known EF 55 to 60%, continue metoprolol to 25 mg p.o. twice daily, no shortness of breath at rest, dyspnea on exertion and dizziness improved significantly posttransfusion  5) acute on chronic anemia--- secondary to #1 above with acute blood loss from GI source-treat as above #1  6)Alcoholic liver disease/Cirrhosis --- INR is 1.1, avoid hepatotoxic agents, last EGD in 2014 without esophageal varices, repeat EGD on 08/30/2018 also without esophageal varices  7)PAD with bilateral iliac stents--- she is a reformed smoker, aspirin and Pletal on hold due to #1 above.... Resume when okay with GI  Disposition/Need  for in-Hospital Stay- patient unable to be discharged at this time due to concerns about GI bleed requiring endoluminal evaluation --- for colonoscopy on 08/31/2018  Code Status : Full  Family Communication:   na  Disposition Plan  : ?? Home   Consults  :  Gi  DVT Prophylaxis  :   - SCDs (Gi Bleed)  Lab Results  Component Value Date   PLT 322 08/30/2018    Inpatient Medications  Scheduled Meds: . allopurinol  100 mg Oral Daily  . folic acid  1 mg Oral Daily  . insulin aspart  0-5 Units Subcutaneous  QHS  . insulin aspart  0-9 Units Subcutaneous TID WC  . insulin glargine  15 Units Subcutaneous Q2200  . magnesium oxide  400 mg Oral Daily  . metoprolol tartrate  25 mg Oral BID  . simvastatin  10 mg Oral Daily  . sodium chloride flush  3 mL Intravenous Q12H  . thiamine  100 mg Oral Daily   Continuous Infusions: . sodium chloride    . sodium chloride 75 mL/hr at 08/30/18 0924  . sodium chloride     PRN Meds:.sodium chloride, acetaminophen **OR** acetaminophen, albuterol, hydrALAZINE, HYDROcodone-acetaminophen, methocarbamol, ondansetron **OR** ondansetron (ZOFRAN) IV, polyethylene glycol, sodium chloride flush, traZODone    Anti-infectives (From admission, onward)   None        Objective:   Vitals:   08/30/18 1306 08/30/18 1315 08/30/18 1325 08/30/18 1456  BP: (!) 119/58 125/72 (!) 176/87 136/84  Pulse: 97 93 94 94  Resp: (!) 21 (!) 26 16 18   Temp: 98.1 F (36.7 C)   98.4 F (36.9 C)  TempSrc: Oral     SpO2: 97% 99% 100% 98%  Weight:      Height:        Wt Readings from Last 3 Encounters:  08/29/18 58.1 kg  08/20/18 57.2 kg  03/09/18 61.2 kg     Intake/Output Summary (Last 24 hours) at 08/30/2018 1941 Last data filed at 08/30/2018 1302 Gross per 24 hour  Intake 303 ml  Output 400 ml  Net -97 ml     Physical Exam Patient is examined daily including today on 08/30/2018 , exams remain the same as of yesterday except that has changed   Gen:- Awake Alert,  In no apparent distress  HEENT:- [place].AT, No sclera icterus Neck-Supple Neck,No JVD,.  Lungs-  CTAB , fair symmetrical air movement CV- S1, S2 normal, regular  Abd-  +ve B.Sounds, Abd Soft, No tenderness,    Extremity/Skin:- No  edema, pedal pulses present  Psych-affect is appropriate, oriented x3 Neuro-no new focal deficits, no tremors   Data Review:   Micro Results No results found for this or any previous visit (from the past 240 hour(s)).  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 COMPARISON:  Head CT August 14, 2015 and brain MRI August 14, 2015 FINDINGS: Brain: There is mild age related volume loss. There is no intracranial mass, hemorrhage, extra-axial fluid collection, or midline shift. There is slight small vessel disease in the centra semiovale bilaterally. No acute infarct evident. Vascular: There is no hyperdense vessel. There is calcification in each carotid siphon region. Skull: Bony calvarium appears intact, although bones are osteoporotic. Sinuses/Orbits: There is mucosal thickening in several ethmoid air cells. Orbits appear symmetric  bilaterally. Other: Mastoid air cells are clear. There is debris in each external auditory canal. IMPRESSION: Age related volume loss with slight periventricular small vessel disease. No mass or hemorrhage. No evident acute infarct. Foci of arterial vascular calcification noted. Mucosal thickening in several ethmoid air cells. Probable cerumen in each external auditory canal. Bones osteoporotic. Electronically Signed   By: Lowella Grip III M.D.   On: 08/28/2018 15:08   Dg Chest Port 1 View  Result Date: 08/28/2018 CLINICAL DATA:  Bloody stool.  Hematemesis. EXAM: PORTABLE CHEST 1 VIEW COMPARISON:  08/20/2018. FINDINGS: Mediastinum and hilar structures normal. Stable coarse interstitial markings again noted. No acute infiltrate. No pleural effusion or pneumothorax. Degenerative changes and scoliosis thoracic spine. Diffuse osteopenia. IMPRESSION: Stable chronic change again noted.  No acute abnormality identified. Electronically Signed   By: Marcello Moores  Register   On: 08/28/2018 13:20   Dg Chest Portable 1 View  Result Date: 08/20/2018 CLINICAL DATA:  Tachycardia. EXAM: PORTABLE CHEST 1 VIEW COMPARISON:  Chest x-ray dated  November 30, 2017. FINDINGS: The heart size and mediastinal contours are within normal limits. Chronically coarsened interstitial markings are similar to prior studies. Unchanged bibasilar scarring. No focal consolidation, pleural effusion, or pneumothorax. No acute osseous abnormality. IMPRESSION: Stable chronic changes.  No active disease. Electronically Signed   By: Titus Dubin M.D.   On: 08/20/2018 17:39     CBC Recent Labs  Lab 08/28/18 1301 08/28/18 1311 08/28/18 1700 08/28/18 2358 08/29/18 0714 08/30/18 1020  WBC 13.4*  --  10.5 15.6* 16.5* 14.0*  HGB 8.5* 9.9* 6.1* 9.6* 10.3* 10.4*  HCT 29.0* 29.0* 20.5* 32.5* 33.5* 33.3*  PLT 341  --  270 329 323 322  MCV 86.8  --  86.9 85.3 83.5 83.0  MCH 25.4*  --  25.8* 25.2* 25.7* 25.9*  MCHC 29.3*  --  29.8* 29.5* 30.7 31.2  RDW 15.7*  --  15.7* 15.9* 15.3 15.8*  LYMPHSABS 1.9  --  2.2  --   --   --   MONOABS 0.9  --  0.9  --   --   --   EOSABS 0.1  --  0.0  --   --   --   BASOSABS 0.0  --  0.0  --   --   --     Chemistries  Recent Labs  Lab 08/28/18 1301 08/28/18 1311 08/29/18 0714 08/30/18 1020  NA 134* 133* 140 136  K 5.5* 5.5* 4.3 4.5  CL 101 103 109 110  CO2 24  --  24 19*  GLUCOSE 416* 428* 88 89  BUN 31* 36* 23 19  CREATININE 1.83* 1.70* 1.36* 1.34*  CALCIUM 9.0  --  8.5* 8.2*  AST 27  --   --   --   ALT 11  --   --   --   ALKPHOS 207*  --   --   --   BILITOT 0.4  --   --   --    ------------------------------------------------------------------------------------------------------------------ No results for input(s): CHOL, HDL, LDLCALC, TRIG, CHOLHDL, LDLDIRECT in the last 72 hours.  Lab Results  Component Value Date   HGBA1C 8.6 (H) 08/10/2015   Coagulation profile Recent Labs  Lab 08/28/18 1410  INR 1.10    Roxan Hockey M.D on 08/30/2018 at 7:41 PM  Pager---[phone] Go to [email or web address] - password [email or web address] for contact info  [facility] - Office  [phone]

## 2018-08-30 NOTE — Anesthesia Postprocedure Evaluation (Signed)
Anesthesia Post Note  Patient: [name]  Procedure(s) Performed: ESOPHAGOGASTRODUODENOSCOPY (EGD) WITH PROPOFOL (N/A )     Patient location during evaluation: Endoscopy Anesthesia Type: MAC Level of consciousness: awake and alert Pain management: pain level controlled Vital Signs Assessment: post-procedure vital signs reviewed and stable Respiratory status: spontaneous breathing, nonlabored ventilation, respiratory function stable and patient connected to nasal cannula oxygen Cardiovascular status: blood pressure returned to baseline and stable Postop Assessment: no apparent nausea or vomiting Anesthetic complications: no    Last Vitals:  Vitals:   08/30/18 1235 08/30/18 1306  BP: (!) 176/90 (!) 119/58  Pulse: 90 97  Resp:  (!) 21  Temp:  36.7 C  SpO2:  97%    Last Pain:  Vitals:   08/30/18 1306  TempSrc: Oral  PainSc: 0-No pain                 Brennan Bailey

## 2018-08-30 NOTE — Transfer of Care (Signed)
Immediate Anesthesia Transfer of Care Note  Patient: [name]  Procedure(s) Performed: ESOPHAGOGASTRODUODENOSCOPY (EGD) WITH PROPOFOL (N/A )  Patient Location: Endoscopy Unit  Anesthesia Type:MAC  Level of Consciousness: awake, alert  and oriented  Airway & Oxygen Therapy: Patient Spontanous Breathing and Patient connected to nasal cannula oxygen  Post-op Assessment: Report given to RN and Post -op Vital signs reviewed and stable  Post vital signs: Reviewed and stable  Last Vitals:  Vitals Value Taken Time  BP 119/58 08/30/2018  1:06 PM  Temp 36.7 C 08/30/2018  1:06 PM  Pulse 97 08/30/2018  1:06 PM  Resp 21 08/30/2018  1:06 PM  SpO2 97 % 08/30/2018  1:06 PM    Last Pain:  Vitals:   08/30/18 1220  TempSrc: Oral  PainSc: 0-No pain         Complications: No apparent anesthesia complications

## 2018-08-30 NOTE — Interval H&P Note (Signed)
History and Physical Interval Note: 72/female with anemia, possible cirrhosis, history of PUD, drop in Hb and maroon stools for a diagnostic EGD.  08/30/2018 12:26 PM  [name]  has presented today for EGD, with the diagnosis of GI bleed  The various methods of treatment have been discussed with the patient and family. After consideration of risks, benefits and other options for treatment, the patient has consented to  Procedure(s): ESOPHAGOGASTRODUODENOSCOPY (EGD) WITH PROPOFOL (N/A) as a surgical intervention .  The patient's history has been reviewed, patient examined, no change in status, stable for surgery.  I have reviewed the patient's chart and labs.  Questions were answered to the patient's satisfaction.     [name]

## 2018-08-30 NOTE — Anesthesia Procedure Notes (Signed)
Date/Time: 08/30/2018 12:39 PM Performed by: Alain Marion, CRNA Oxygen Delivery Method: Simple face mask Placement Confirmation: positive ETCO2

## 2018-08-30 NOTE — Anesthesia Preprocedure Evaluation (Addendum)
Anesthesia Evaluation  Patient identified by MRN, date of birth, ID band Patient awake    Reviewed: Allergy & Precautions, NPO status , Patient's Chart, lab work & pertinent test results, reviewed documented beta blocker date and time   History of Anesthesia Complications Negative for: history of anesthetic complications  Airway Mallampati: II  TM Distance: >3 FB Neck ROM: Full    Dental  (+) Dental Advisory Given, Edentulous Lower, Edentulous Upper   Pulmonary asthma , sleep apnea , COPD (2 lpm O2 at home),  COPD inhaler and oxygen dependent, Current Smoker,    Pulmonary exam normal breath sounds clear to auscultation       Cardiovascular hypertension, Pt. on medications and Pt. on home beta blockers + Peripheral Vascular Disease and +CHF  Normal cardiovascular exam Rhythm:Regular Rate:Normal     Neuro/Psych Anxiety Depression negative neurological ROS     GI/Hepatic PUD, GERD  ,(+) Cirrhosis       , Acute GI bleed   Endo/Other  diabetes, Type 2, Oral Hypoglycemic Agents, Insulin Dependent  Renal/GU Renal Insufficiency and ARFRenal disease     Musculoskeletal  (+) Arthritis ,   Abdominal   Peds  Hematology  (+) anemia ,   Anesthesia Other Findings Day of surgery medications reviewed with the patient.  Reproductive/Obstetrics                          Anesthesia Physical Anesthesia Plan  ASA: III  Anesthesia Plan: MAC   Post-op Pain Management:    Induction:   PONV Risk Score and Plan: Treatment may vary due to age or medical condition and Propofol infusion  Airway Management Planned: Natural Airway and Nasal Cannula  Additional Equipment:   Intra-op Plan:   Post-operative Plan:   Informed Consent: I have reviewed the patients History and Physical, chart, labs and discussed the procedure including the risks, benefits and alternatives for the proposed anesthesia with the  patient or authorized representative who has indicated his/her understanding and acceptance.     Plan Discussed with: CRNA  Anesthesia Plan Comments:         Anesthesia Quick Evaluation

## 2018-08-30 NOTE — Significant Event (Addendum)
Was called by Saralyn Pilar from Telemetry Monitoring around 2100 to inform this RN that patient was in bradycardia around mid 30s with the pacer not firing.  When this RN entered patient's room, this RN found patient slumped on the bedside commode with eyes staring forward. Was not responding to questions or reacting to stimulation. This RN was unsure if a pulse was felt therefore Code Blue alert was called and a code cart was acquired. This RN and an NT transferred patient from commode to the bed and upon being placed on the bed patient starting becoming arousable. Patient was able to answer questions correctly and per heart monitor was back in sinus rhythm. A radial pulse was felt. Rapid Response was in the unit and assessed the patient at bedside and concluded that patient most likely vagaled while on the bedside commode.  Vitals were taken and as follows: 139/71(91), HR 92.  Patient is in the middle of taking bowel prep in preparation for a colonoscopy scheduled on 08/30/2018. Will page GI to see if need to continue prep for tonight.  UPDATE 2150 08/30/2018: Per night GI covering MD Oletta Lamas, continue bowel prep and monitor patient more often.

## 2018-08-30 NOTE — Brief Op Note (Signed)
08/28/2018 - 08/30/2018  1:14 PM  PATIENT:  [name]  72 y.o. female  PRE-OPERATIVE DIAGNOSIS:  GI bleed  POST-OPERATIVE DIAGNOSIS:  hiatal hernia, no active bleeding   PROCEDURE:  Procedure(s): ESOPHAGOGASTRODUODENOSCOPY (EGD) WITH PROPOFOL (N/A)  SURGEON:  Surgeon(s) and Role:    [name], MD - Primary  PHYSICIAN ASSISTANT:   ASSISTANTS: Kingsley Plan, RN, Cletis Athens, Tech  ANESTHESIA:   MAC  EBL:  None  BLOOD ADMINISTERED:none  DRAINS: none   LOCAL MEDICATIONS USED:  NONE  SPECIMEN:  No Specimen  DISPOSITION OF SPECIMEN:  N/A  COUNTS:  YES  TOURNIQUET:  * No tourniquets in log *  DICTATION: .Dragon Dictation  PLAN OF CARE: Admit to inpatient   PATIENT DISPOSITION:  PACU - hemodynamically stable.   Delay start of Pharmacological VTE agent (>24hrs) due to surgical blood loss or risk of bleeding: no

## 2018-08-31 ENCOUNTER — Encounter (HOSPITAL_COMMUNITY)
Admission: EM | Disposition: A | Discharge status: Home Health | Payer: Self-pay | Source: Home / Self Care | Attending: Family Medicine

## 2018-08-31 ENCOUNTER — Ambulatory Visit: Payer: Medicare Other | Admitting: Podiatry

## 2018-08-31 ENCOUNTER — Observation Stay (HOSPITAL_COMMUNITY): Payer: Medicare Other | Admitting: Anesthesiology

## 2018-08-31 ENCOUNTER — Encounter (HOSPITAL_COMMUNITY): Payer: Self-pay | Admitting: Gastroenterology

## 2018-08-31 DIAGNOSIS — G4730 Sleep apnea, unspecified: Secondary | ICD-10-CM | POA: Diagnosis present

## 2018-08-31 DIAGNOSIS — K625 Hemorrhage of anus and rectum: Secondary | ICD-10-CM | POA: Diagnosis present

## 2018-08-31 DIAGNOSIS — J449 Chronic obstructive pulmonary disease, unspecified: Secondary | ICD-10-CM | POA: Diagnosis present

## 2018-08-31 DIAGNOSIS — I10 Essential (primary) hypertension: Secondary | ICD-10-CM | POA: Diagnosis not present

## 2018-08-31 DIAGNOSIS — K7030 Alcoholic cirrhosis of liver without ascites: Secondary | ICD-10-CM

## 2018-08-31 DIAGNOSIS — K922 Gastrointestinal hemorrhage, unspecified: Secondary | ICD-10-CM | POA: Diagnosis not present

## 2018-08-31 DIAGNOSIS — E1151 Type 2 diabetes mellitus with diabetic peripheral angiopathy without gangrene: Secondary | ICD-10-CM | POA: Diagnosis present

## 2018-08-31 DIAGNOSIS — K210 Gastro-esophageal reflux disease with esophagitis: Secondary | ICD-10-CM | POA: Diagnosis present

## 2018-08-31 DIAGNOSIS — D631 Anemia in chronic kidney disease: Secondary | ICD-10-CM | POA: Diagnosis present

## 2018-08-31 DIAGNOSIS — K635 Polyp of colon: Secondary | ICD-10-CM | POA: Diagnosis present

## 2018-08-31 DIAGNOSIS — D649 Anemia, unspecified: Secondary | ICD-10-CM | POA: Diagnosis not present

## 2018-08-31 DIAGNOSIS — K7010 Alcoholic hepatitis without ascites: Secondary | ICD-10-CM | POA: Diagnosis present

## 2018-08-31 DIAGNOSIS — K644 Residual hemorrhoidal skin tags: Secondary | ICD-10-CM | POA: Diagnosis present

## 2018-08-31 DIAGNOSIS — E875 Hyperkalemia: Secondary | ICD-10-CM | POA: Diagnosis present

## 2018-08-31 DIAGNOSIS — E11649 Type 2 diabetes mellitus with hypoglycemia without coma: Secondary | ICD-10-CM | POA: Diagnosis not present

## 2018-08-31 DIAGNOSIS — I5032 Chronic diastolic (congestive) heart failure: Secondary | ICD-10-CM | POA: Diagnosis present

## 2018-08-31 DIAGNOSIS — K861 Other chronic pancreatitis: Secondary | ICD-10-CM | POA: Diagnosis present

## 2018-08-31 DIAGNOSIS — D62 Acute posthemorrhagic anemia: Secondary | ICD-10-CM | POA: Diagnosis present

## 2018-08-31 DIAGNOSIS — Z9981 Dependence on supplemental oxygen: Secondary | ICD-10-CM | POA: Diagnosis not present

## 2018-08-31 DIAGNOSIS — N183 Chronic kidney disease, stage 3 (moderate): Secondary | ICD-10-CM | POA: Diagnosis present

## 2018-08-31 DIAGNOSIS — E785 Hyperlipidemia, unspecified: Secondary | ICD-10-CM | POA: Diagnosis present

## 2018-08-31 DIAGNOSIS — E1122 Type 2 diabetes mellitus with diabetic chronic kidney disease: Secondary | ICD-10-CM | POA: Diagnosis present

## 2018-08-31 DIAGNOSIS — K5731 Diverticulosis of large intestine without perforation or abscess with bleeding: Secondary | ICD-10-CM | POA: Diagnosis present

## 2018-08-31 DIAGNOSIS — N179 Acute kidney failure, unspecified: Secondary | ICD-10-CM | POA: Diagnosis present

## 2018-08-31 DIAGNOSIS — E872 Acidosis: Secondary | ICD-10-CM | POA: Diagnosis present

## 2018-08-31 DIAGNOSIS — I130 Hypertensive heart and chronic kidney disease with heart failure and stage 1 through stage 4 chronic kidney disease, or unspecified chronic kidney disease: Secondary | ICD-10-CM | POA: Diagnosis present

## 2018-08-31 DIAGNOSIS — K449 Diaphragmatic hernia without obstruction or gangrene: Secondary | ICD-10-CM | POA: Diagnosis present

## 2018-08-31 DIAGNOSIS — K648 Other hemorrhoids: Secondary | ICD-10-CM | POA: Diagnosis present

## 2018-08-31 HISTORY — PX: COLONOSCOPY WITH PROPOFOL: SHX5780

## 2018-08-31 HISTORY — PX: BIOPSY: SHX5522

## 2018-08-31 LAB — GLUCOSE, CAPILLARY
Glucose-Capillary: 151 mg/dL — ABNORMAL HIGH (ref 70–99)
Glucose-Capillary: 36 mg/dL — CL (ref 70–99)
Glucose-Capillary: 85 mg/dL (ref 70–99)
Glucose-Capillary: 90 mg/dL (ref 70–99)
Glucose-Capillary: 141 mg/dL — ABNORMAL HIGH (ref 70–99)

## 2018-08-31 LAB — BASIC METABOLIC PANEL
Anion gap: 9 (ref 5–15)
BUN: 16 mg/dL (ref 8–23)
CALCIUM: 8.2 mg/dL — AB (ref 8.9–10.3)
CO2: 19 mmol/L — ABNORMAL LOW (ref 22–32)
CREATININE: 1.31 mg/dL — AB (ref 0.44–1.00)
Chloride: 111 mmol/L (ref 98–111)
GFR calc Af Amer: 46 mL/min — ABNORMAL LOW (ref >60)
GFR, EST NON AFRICAN AMERICAN: 40 mL/min — AB (ref >60)
Glucose, Bld: 139 mg/dL — ABNORMAL HIGH (ref 70–99)
Potassium: 4.1 mmol/L (ref 3.5–5.1)
Sodium: 139 mmol/L (ref 135–145)

## 2018-08-31 LAB — CBC
HCT: 39.7 % (ref 36.0–46.0)
Hemoglobin: 12.2 g/dL (ref 12.0–15.0)
MCH: 26.5 pg (ref 26.0–34.0)
MCHC: 30.7 g/dL (ref 30.0–36.0)
MCV: 86.3 fL (ref 80.0–100.0)
PLATELETS: 271 10*3/uL (ref 150–400)
RBC: 4.6 MIL/uL (ref 3.87–5.11)
RDW: 16.4 % — AB (ref 11.5–15.5)
WBC: 12.8 10*3/uL — ABNORMAL HIGH (ref 4.0–10.5)
nRBC: 0 % (ref 0.0–0.2)

## 2018-08-31 SURGERY — COLONOSCOPY WITH PROPOFOL
Anesthesia: Monitor Anesthesia Care

## 2018-08-31 MED ORDER — GLYCOPYRROLATE 0.2 MG/ML IJ SOLN
INTRAMUSCULAR | Status: DC | PRN
  Administered 2018-08-31: 0.2 mg via INTRAVENOUS

## 2018-08-31 MED ORDER — PROPOFOL 500 MG/50ML IV EMUL
INTRAVENOUS | Status: DC | PRN
  Administered 2018-08-31: 60 ug/kg/min via INTRAVENOUS

## 2018-08-31 MED ORDER — DEXTROSE 50 % IV SOLN
INTRAVENOUS | Status: AC
  Administered 2018-08-31: 50 mL
  Filled 2018-08-31: qty 50

## 2018-08-31 MED ORDER — LACTATED RINGERS IV SOLN
INTRAVENOUS | Status: DC | PRN
  Administered 2018-08-31: 11:00:00 via INTRAVENOUS

## 2018-08-31 MED ORDER — DEXTROSE-NACL 5-0.45 % IV SOLN
INTRAVENOUS | Status: DC
  Administered 2018-08-31: 08:00:00 via INTRAVENOUS

## 2018-08-31 MED ORDER — PROPOFOL 10 MG/ML IV BOLUS
INTRAVENOUS | Status: DC | PRN
  Administered 2018-08-31 (×2): 20 mg via INTRAVENOUS

## 2018-08-31 MED ORDER — PSYLLIUM 95 % PO PACK
1.0000 | PACK | Freq: Every day | ORAL | Status: DC
  Administered 2018-08-31 – 2018-09-01 (×2): 1 via ORAL
  Filled 2018-08-31 (×2): qty 1

## 2018-08-31 MED ORDER — FENTANYL CITRATE (PF) 100 MCG/2ML IJ SOLN: 25.0000 ug | INTRAMUSCULAR | Status: DC | PRN

## 2018-08-31 MED ORDER — DEXTROSE 50 % IV SOLN: 1.0000 | INTRAVENOUS | Status: DC | PRN

## 2018-08-31 MED ORDER — INSULIN GLARGINE 100 UNIT/ML ~~LOC~~ SOLN
10.0000 [IU] | Freq: Every day | SUBCUTANEOUS | Status: DC
  Administered 2018-08-31: 10 [IU] via SUBCUTANEOUS
  Filled 2018-08-31 (×2): qty 0.1

## 2018-08-31 MED ORDER — LIDOCAINE HCL (CARDIAC) PF 100 MG/5ML IV SOSY
PREFILLED_SYRINGE | INTRAVENOUS | Status: DC | PRN
  Administered 2018-08-31: 50 mg via INTRAVENOUS

## 2018-08-31 SURGICAL SUPPLY — 22 items

## 2018-08-31 NOTE — Brief Op Note (Signed)
08/28/2018 - 08/31/2018  11:33 AM  PATIENT:  Kathryne Sharper Placide  73 y.o. female  PRE-OPERATIVE DIAGNOSIS:  Hematochezia, normal EGD  POST-OPERATIVE DIAGNOSIS:  transverse colon polyp, diverticula  PROCEDURE:  Procedure(s): COLONOSCOPY WITH PROPOFOL (N/A) BIOPSY  SURGEON:  Surgeon(s) and Role:    [name], MD - Primary  PHYSICIAN ASSISTANT:   ASSISTANTS:Jennifer ZHu, RN, Cletis Athens, Tech  ANESTHESIA:   MAC  EBL:  Minimal  BLOOD ADMINISTERED:none  DRAINS: none   LOCAL MEDICATIONS USED:  NONE  SPECIMEN:  Biopsy / Limited Resection  DISPOSITION OF SPECIMEN:  PATHOLOGY  COUNTS:  YES  TOURNIQUET:  * No tourniquets in log *  DICTATION: .Dragon Dictation  PLAN OF CARE: Admit to inpatient   PATIENT DISPOSITION:  PACU - hemodynamically stable.   Delay start of Pharmacological VTE agent (>24hrs) due to surgical blood loss or risk of bleeding: no

## 2018-08-31 NOTE — Op Note (Addendum)
[facility] Patient Name: [name] Procedure Date : 08/31/2018 MRN: [id number] Attending MD: [name] , MD Date of Birth: [birth year] CSN: [id number] Age: 73 Admit Type: Inpatient Procedure:                Colonoscopy Indications:              Last colonoscopy: 2014, Hematochezia, Melena,                            history of right hemicolectomy for large villous                            adenoma in 2012 Providers:                [name], MD, Angus Seller, Duy Alfonse Spruce,                            Technician, [place], Technician, [place].                            Beckner, CRNA Referring MD:              Medicines:                Monitored Anesthesia Care Complications:            No immediate complications. Estimated blood loss:                            Minimal. Estimated Blood Loss:     Estimated blood loss was minimal. Procedure:                Pre-Anesthesia Assessment:                           - Prior to the procedure, a History and Physical                            was performed, and patient medications and                            allergies were reviewed. The patient's tolerance of                            previous anesthesia was also reviewed. The risks                            and benefits of the procedure and the sedation                            options and risks were discussed with the patient.                            All questions were answered, and informed consent                            was obtained. Prior Anticoagulants: The patient has  taken aspirin, last dose was 3 days prior to                            procedure. ASA Grade Assessment: III - A patient                            with severe systemic disease. After reviewing the                            risks and benefits, the patient was deemed in                            satisfactory condition to undergo the procedure.                            After obtaining informed consent, the colonoscope                            was passed under direct vision. Throughout the                            procedure, the patient's blood pressure, pulse, and                            oxygen saturations were monitored continuously.The                            colonoscopy was performed without difficulty. The                            patient tolerated the procedure well. The quality                            of the bowel preparation was good. The PCF-H190DL                            ([id number]) Olympus pediatric colonoscope was                            introduced through the anus and advanced to the the                            ileocolonic anastomosis. Scope In: 11:13:27 AM Scope Out: 11:28:18 AM Scope Withdrawal Time: 0 hours 13 minutes 10 seconds  Total Procedure Duration: 0 hours 14 minutes 51 seconds  Findings:      Hemorrhoids were found on perianal exam.      Multiple small and large-mouthed diverticula were found in the sigmoid       colon and descending colon.      There was evidence of a prior end-to-side ileo-colonic anastomosis in       the ascending colon. This was patent and was characterized by healthy       appearing mucosa. The anastomosis was traversed.      Non-bleeding internal hemorrhoids were found during retroflexion.  A 5 mm polyp was found in the transverse colon. The polyp was sessile.       The polyp was removed with a piecemeal technique using a cold biopsy       forceps. Resection and retrieval were complete. Impression:               - Hemorrhoids found on perianal exam.                           - Diverticulosis in the sigmoid colon and in the                            descending colon.                           - Patent end-to-side ileo-colonic anastomosis,                            characterized by healthy appearing mucosa.                           - Non-bleeding internal  hemorrhoids.                           - One 5 mm polyp in the transverse colon, removed                            piecemeal using a cold biopsy forceps. Resected and                            retrieved. Moderate Sedation:      Patient did not receive moderate sedation for this procedure, but       instead received monitored anesthesia care. Recommendation:           - High fiber diet.                           - Continue present medications.                           - Await pathology results.                           - Repeat colonoscopy for surveillance based on                            pathology results. Procedure Code(s):        --- Professional ---                           [phone], Colonoscopy, flexible; with biopsy, single                            or multiple Diagnosis Code(s):        --- Professional ---                           I34.7, Other  hemorrhoids                           Z98.0, Intestinal bypass and anastomosis status                           D12.3, Benign neoplasm of transverse colon (hepatic                            flexure or splenic flexure)                           K92.1, Melena (includes Hematochezia)                           K57.30, Diverticulosis of large intestine without                            perforation or abscess without bleeding CPT copyright 2018 American Medical Association. All rights reserved. The codes documented in this report are preliminary and upon coder review may  be revised to meet current compliance requirements. [name], MD 08/31/2018 11:33:28 AM This report has been signed electronically. Number of Addenda: 0

## 2018-08-31 NOTE — Progress Notes (Signed)
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   Assessment & Plan: Principal Problem:   GI bleed Active Problems:   Anemia   COPD (chronic obstructive pulmonary disease) (HCC)   Sleep apnea   Controlled diabetes mellitus type 2 with complications (HCC)   Acute renal failure superimposed on stage 3 chronic kidney disease (HCC)   PVD, LEIA/LIIA and RCIA pta 7/09- ABIs 0.68 and 0.69 Feb 2014   PUD (peptic ulcer disease) GI bleed in past (2011)   Cirrhosis of liver ([place])   Essential hypertension   AKI (acute kidney injury) ([place])   Principal Problem AcuteGI bleed -Patient was admitted to the hospital with bloody stool, fatigue dizziness, received 1 unit of packed red blood cells. [facility] was consulted and underwent an EGD on 08/31/2011 which was unremarkable -This was followed by colonoscopy today which show diverticulitis which may be the source of her bleed but no active bleeding seen.  GI signed off -Globin now stable, to be discharged back to previous environment pending PT evaluation -Hold naproxen,hold aspirin and Pletal,  Active problems AKI on CKD 3with Hyperkalemia -acute kidney injury on CKD stage -III  -Creatinine on admission 1.83 with baseline around 1.5-1.6.  With hydration it improved to 1.34,  hold lisinopril  DM2 -no recent A1c, she was hypoglycemic this morning into the 30s, will reduce Lantus dose even further and she was briefly placed on glucose infusion while she was n.p.o.  Following colonoscopy this was discontinued and she was allowed a diet  Deconditioning -Patient lives by herself, in a retirement community but has no help.  She has not been ambulating at all in the room per RN.  PT evaluation pending  HFpEF -last known EF 55 to 60%, continue metoprolol to 25 mg p.o. twice daily, no shortness of breath at rest, dyspnea on exertion and dizziness improved significantly posttransfusion  Acute on chronic anemia -secondary to #1 above with acute blood loss from GI source-treat as above #1  Alcoholic liver disease/Cirrhosis  -INR is 1.1, avoid hepatotoxic agents,last EGD in 2014 without esophageal varices, repeat EGD on 08/30/2018 also without esophageal varices  PADwith bilateral iliac stents -she is a reformed smoker,aspirin and Pletal on hold due to #1 above, can probably resume on discharge if stable  Scheduled Meds: . allopurinol  100 mg Oral Daily  . folic acid  1 mg Oral Daily  . insulin aspart  0-5 Units Subcutaneous QHS  . insulin aspart  0-9 Units Subcutaneous TID WC  . insulin glargine  10 Units Subcutaneous Q2200  . magnesium oxide  400 mg Oral Daily  . metoprolol tartrate  25 mg Oral BID  . psyllium  1 packet Oral Daily  . simvastatin  10 mg Oral Daily  . sodium chloride flush  3 mL Intravenous Q12H  . thiamine  100 mg Oral Daily   Continuous Infusions: . sodium chloride  PRN Meds:.sodium chloride, acetaminophen **OR** acetaminophen, albuterol, dextrose, hydrALAZINE, HYDROcodone-acetaminophen, methocarbamol, ondansetron **OR** ondansetron (ZOFRAN) IV, sodium chloride flush, traZODone  DVT prophylaxis: SCDs Code Status: Full code Family Communication: no family at bedside Disposition Plan: home after PT evaluates  Consultants:    GI  Procedures:   EGD  Colonoscopy   Antimicrobials:  None    Objective: Vitals:   08/31/18 1054 08/31/18 1140 08/31/18 1150 08/31/18 1305  BP: (!) 177/83 (!) 169/81 (!) 166/95 (!) 158/106  Pulse:  (!) 120 (!) 117 (!) 117  Resp:  (!) 26 18 18   Temp: 98.2 F (36.8 C) 97.9 F (36.6 C)  97.8 F (36.6 C)  TempSrc: Oral Oral    SpO2:  100% 100% 98%  Weight: 58.1 kg     Height: 5' (1.524 m)       Intake/Output Summary (Last 24 hours) at 08/31/2018 1500 Last data filed at 08/31/2018 1130 Gross per 24 hour  Intake 2114.81 ml  Output -  Net 2114.81 ml   Filed Weights   08/29/18 0409 08/31/18 1054  Weight: 58.1 kg 58.1 kg   Examination: Constitutional: NAD Eyes:  lids and conjunctivae normal ENMT: Mucous membranes are moist. Neck: normal, supple Respiratory: clear to auscultation bilaterally, no wheezing, no crackles.  Cardiovascular: Regular rate and rhythm, no murmurs / rubs / gallops.  Abdomen: no tenderness. Bowel sounds positive.  Musculoskeletal: no clubbing / cyanosis.  Skin: no rashes Neurologic: No focal deficits Psychiatric: Normal judgment and insight. Alert and oriented x 3. Normal mood.   Data Reviewed: I have independently reviewed following labs and imaging studies   CBC: Recent Labs  Lab 08/28/18 1301  08/28/18 1700 08/28/18 2358 08/29/18 0714 08/30/18 1020 08/31/18 0930  WBC 13.4*  --  10.5 15.6* 16.5* 14.0* 12.8*  NEUTROABS 10.5*  --  7.3  --   --   --   --   HGB 8.5*   < > 6.1* 9.6* 10.3* 10.4* 12.2  HCT 29.0*   < > 20.5* 32.5* 33.5* 33.3* 39.7  MCV 86.8  --  86.9 85.3 83.5 83.0 86.3  PLT 341  --  270 329 323 322 271   < > = values in this interval not displayed.   Basic Metabolic Panel: Recent Labs  Lab 08/28/18 1301 08/28/18 1311 08/29/18 0714 08/30/18 1020 08/31/18 0930  NA 134* 133* 140 136 139  K 5.5* 5.5* 4.3 4.5 4.1  CL 101 103 109 110 111  CO2 24  --  24 19* 19*  GLUCOSE 416* 428* 88 89 139*  BUN 31* 36* 23 19 16    CREATININE 1.83* 1.70* 1.36* 1.34* 1.31*  CALCIUM 9.0  --  8.5* 8.2* 8.2*   GFR: Estimated Creatinine Clearance: 30 mL/min (A) (by C-G formula based on SCr of 1.31 mg/dL (H)). Liver Function Tests: Recent Labs  Lab 08/28/18 1301  AST 27  ALT 11  ALKPHOS 207*  BILITOT 0.4  PROT 6.4*  ALBUMIN 2.2*   No results for input(s): LIPASE, AMYLASE in the last 168 hours. No results for input(s): AMMONIA in the last 168 hours. Coagulation Profile: Recent Labs  Lab 08/28/18 1410  INR 1.10   Cardiac Enzymes: No results for input(s): CKTOTAL, CKMB, CKMBINDEX, TROPONINI in the last 168 hours. BNP (last 3 results) No results for input(s): PROBNP in the last 8760 hours. HbA1C: No results for input(s): HGBA1C in the last 72 hours. CBG: Recent Labs  Lab 08/30/18 2126 08/31/18 0746 08/31/18 0749 08/31/18 0822 08/31/18 1216  GLUCAP 103* 34* 36* 151* 85   Lipid Profile: No results for input(s): CHOL, HDL, LDLCALC, TRIG, CHOLHDL, LDLDIRECT in the last 72 hours. Thyroid Function Tests: No results for input(s): TSH, T4TOTAL, FREET4, T3FREE, THYROIDAB in the last 72 hours. Anemia Panel: No results for input(s): VITAMINB12, FOLATE, FERRITIN, TIBC, IRON, RETICCTPCT in the last 72 hours. Urine analysis:    Component Value Date/Time   COLORURINE STRAW (A) 08/28/2018 1700   APPEARANCEUR CLEAR 08/28/2018 1700   LABSPEC 1.007 08/28/2018 1700   PHURINE 6.0 08/28/2018 1700   GLUCOSEU >=500 (A) 08/28/2018 1700   HGBUR MODERATE (A) 08/28/2018 1700   BILIRUBINUR NEGATIVE 08/28/2018 1700   KETONESUR NEGATIVE 08/28/2018 1700   PROTEINUR 100 (A) 08/28/2018 1700   UROBILINOGEN 0.2 05/09/2015 2109   NITRITE NEGATIVE 08/28/2018 1700   LEUKOCYTESUR NEGATIVE 08/28/2018 1700   Sepsis Labs: Invalid input(s): PROCALCITONIN, LACTICIDVEN  No results found for this or any previous visit (from the past 240 hour(s)).   Radiology Studies: No results found.  Marzetta Board, MD, PhD [facility] Pager [phone]  If 7PM-7AM, please contact night-coverage [email or web address] Password [email or web address] 08/31/2018, 3:00 PM

## 2018-08-31 NOTE — Anesthesia Preprocedure Evaluation (Signed)
Anesthesia Evaluation  Patient identified by MRN, date of birth, ID band Patient awake    Reviewed: Allergy & Precautions, NPO status , Patient's Chart, lab work & pertinent test results, reviewed documented beta blocker date and time   History of Anesthesia Complications Negative for: history of anesthetic complications  Airway Mallampati: II  TM Distance: >3 FB Neck ROM: Full    Dental  (+) Dental Advisory Given, Edentulous Lower, Edentulous Upper   Pulmonary asthma , sleep apnea and Oxygen sleep apnea , COPD,  COPD inhaler and oxygen dependent, Current Smoker,    Pulmonary exam normal breath sounds clear to auscultation       Cardiovascular hypertension, Pt. on home beta blockers and Pt. on medications + Peripheral Vascular Disease (b/l iliac stenting) and +CHF  Normal cardiovascular exam Rhythm:Regular Rate:Normal     Neuro/Psych PSYCHIATRIC DISORDERS Anxiety Depression negative neurological ROS     GI/Hepatic PUD, GERD  Medicated,(+) Cirrhosis       , Acute GI bleed   Endo/Other  diabetes, Type 2, Oral Hypoglycemic Agents, Insulin Dependent  Renal/GU Renal InsufficiencyRenal disease     Musculoskeletal  (+) Arthritis ,   Abdominal   Peds  Hematology negative hematology ROS (+) anemia ,   Anesthesia Other Findings Day of surgery medications reviewed with the patient.  Reproductive/Obstetrics                            Anesthesia Physical  Anesthesia Plan  ASA: III  Anesthesia Plan: MAC   Post-op Pain Management:    Induction:   PONV Risk Score and Plan: 1 and Treatment may vary due to age or medical condition and Propofol infusion  Airway Management Planned: Natural Airway and Nasal Cannula  Additional Equipment:   Intra-op Plan:   Post-operative Plan:   Informed Consent: I have reviewed the patients History and Physical, chart, labs and discussed the procedure  including the risks, benefits and alternatives for the proposed anesthesia with the patient or authorized representative who has indicated his/her understanding and acceptance.   Dental advisory given  Plan Discussed with: CRNA  Anesthesia Plan Comments:         Anesthesia Quick Evaluation

## 2018-08-31 NOTE — Anesthesia Procedure Notes (Signed)
Procedure Name: MAC Date/Time: 08/31/2018 11:04 AM Performed by: Mariea Clonts, CRNA Pre-anesthesia Checklist: Patient identified, Suction available, Emergency Drugs available, Patient being monitored and Timeout performed Patient Re-evaluated:Patient Re-evaluated prior to induction Oxygen Delivery Method: Nasal cannula and Simple face mask

## 2018-08-31 NOTE — Op Note (Signed)
Colonoscopy was performed for dark stools with an unremarkable EGD.  Findings: Multiple small and large diverticuli found in sigmoid and descending colon. A small transverse polyp, removed via biopsy polypectomy. Changes of right hemicolectomy with healthy-appearing anastomosis. Internal and external hemorrhoids.   Recommendations: High-fiber diet. Bulk forming laxative such as Metamucil/Benefiber on a regular basis. Bleeding was likely related to diverticulosis. No further bleeding noted. Okay to discharge from GI standpoint. Pathology to be followed as outpatient.   [name], MD

## 2018-08-31 NOTE — Evaluation (Signed)
Physical Therapy Evaluation Patient Details Name: [name] MRN: [id number] DOB: Aug 13, 1946 Today's Date: 08/31/2018   History of Present Illness  72/female with anemia, possible cirrhosis, history of PUD, drop in Hb and maroon stools for a diagnostic EGD.  Pt positive for GIB.  PMH: COPD, DM, HTN.  Clinical Impression  Pt admitted with above diagnosis. Pt currently with functional limitations due to the deficits listed below (see PT Problem List). Pt was able to ambulate with RW with good stability overall.  Pt demonstrates safety awareness with rW.  Pt did use cane PTA and discussed that RW is best right now and pt agrees.  Will follow acutely.   Pt will benefit from skilled PT to increase their independence and safety with mobility to allow discharge to the venue listed below.      Follow Up Recommendations Home health PT;Supervision - Intermittent(HHOT, H[facility]ide)    Equipment Recommendations  None recommended by PT    Recommendations for Other Services       Precautions / Restrictions Precautions Precautions: Fall Restrictions Weight Bearing Restrictions: No      Mobility  Bed Mobility Overal bed mobility: Independent             General bed mobility comments: incr time but no assist needed  Transfers Overall transfer level: Needs assistance Equipment used: Rolling walker (2 wheeled);None Transfers: Sit to/from Stand Sit to Stand: Min guard;Supervision         General transfer comment: Pt needed steadying assist when standing without device.  Supervision when stood to RW>   Ambulation/Gait Ambulation/Gait assistance: Supervision Gait Distance (Feet): 280 Feet Assistive device: Rolling walker (2 wheeled) Gait Pattern/deviations: Step-through pattern;Decreased stride length   Gait velocity interpretation: <1.31 ft/sec, indicative of household ambulator General Gait Details: Pt was able to ambulate with supervision and no physical assist.  Pt steady with rW  and understands that PT recommends pt to use RW at all times initially at home.   Stairs            Wheelchair Mobility    Modified Rankin (Stroke Patients Only)       Balance Overall balance assessment: Needs assistance Sitting-balance support: No upper extremity supported;Feet supported Sitting balance-Leahy Scale: Fair     Standing balance support: Bilateral upper extremity supported;During functional activity Standing balance-Leahy Scale: Poor Standing balance comment: relies on UE support for balance                             Pertinent Vitals/Pain Pain Assessment: No/denies pain    Home Living Family/patient expects to be discharged to:: Private residence Living Arrangements: Alone Available Help at Discharge: Personal care attendant(1.5 hours day ) Type of Home: Apartment Home Access: Level entry     Home Layout: One level Home Equipment: Walker - 2 wheels;Walker - 4 wheels;Bedside commode;Cane - single point;Cane - quad;Tub bench      Prior Function Level of Independence: Needs assistance   Gait / Transfers Assistance Needed: ambulates with cane and rollator outdoors  ADL's / Homemaking Assistance Needed: B/D with assist         Hand Dominance   Dominant Hand: Right    Extremity/Trunk Assessment   Upper Extremity Assessment Upper Extremity Assessment: Defer to OT evaluation    Lower Extremity Assessment Lower Extremity Assessment: Generalized weakness    Cervical / Trunk Assessment Cervical / Trunk Assessment: Normal  Communication   Communication: No difficulties  Cognition  Arousal/Alertness: Awake/alert Behavior During Therapy: WFL for tasks assessed/performed Overall Cognitive Status: Within Functional Limits for tasks assessed                                        General Comments      Exercises     Assessment/Plan    PT Assessment Patient needs continued PT services  PT Problem List  Decreased activity tolerance;Decreased balance;Decreased mobility;Decreased knowledge of use of DME;Decreased safety awareness;Decreased knowledge of precautions       PT Treatment Interventions DME instruction;Gait training;Functional mobility training;Therapeutic activities;Therapeutic exercise;Balance training;Patient/family education    PT Goals (Current goals can be found in the Care Plan section)  Acute Rehab PT Goals Patient Stated Goal: to go home tomorrow PT Goal Formulation: With patient Time For Goal Achievement: 09/14/18 Potential to Achieve Goals: Good    Frequency Min 3X/week   Barriers to discharge Decreased caregiver support      Co-evaluation               AM-PAC PT "6 Clicks" Mobility  Outcome Measure Help needed turning from your back to your side while in a flat bed without using bedrails?: None Help needed moving from lying on your back to sitting on the side of a flat bed without using bedrails?: None Help needed moving to and from a bed to a chair (including a wheelchair)?: A Little Help needed standing up from a chair using your arms (e.g., wheelchair or bedside chair)?: A Little Help needed to walk in hospital room?: A Little Help needed climbing 3-5 steps with a railing? : A Little 6 Click Score: 20    End of Session Equipment Utilized During Treatment: Gait belt Activity Tolerance: Patient limited by fatigue Patient left: in bed;with call bell/phone within reach;with bed alarm set Nurse Communication: Mobility status PT Visit Diagnosis: Muscle weakness (generalized) (M62.81)    Time: 5277-8242 PT Time Calculation (min) (ACUTE ONLY): 27 min   Charges:   PT Evaluation $PT Eval Moderate Complexity: 1 Mod PT Treatments $Gait Training: 8-22 mins        [place] Pager:  [phone]  Office:  [place] 08/31/2018, 4:14 PM

## 2018-08-31 NOTE — Interval H&P Note (Signed)
History and Physical Interval Note: 72/female with anemia, black stools, Hb 6.1 on presentation, unremarkable EGD yesterday, history of villious adenoma requring right hemicolectomy, last colonoscopy in 2014 for a colonoscopy today.  08/31/2018 10:46 AM  [name]  has presented today for colonoscopy, with the diagnosis of Hematochezia, normal EGD  The various methods of treatment have been discussed with the patient and family. After consideration of risks, benefits and other options for treatment, the patient has consented to  Procedure(s): COLONOSCOPY WITH PROPOFOL (N/A) as a surgical intervention .  The patient's history has been reviewed, patient examined, no change in status, stable for surgery.  I have reviewed the patient's chart and labs.  Questions were answered to the patient's satisfaction.     [name]

## 2018-08-31 NOTE — Progress Notes (Signed)
Hypoglycemic Event  CBG: 34  Treatment: D50 50 mL (25 gm)  Symptoms: Sleepy  Follow-up CBG: Time: 3888 CBG Result: 151  Possible Reasons for Event: Inadequate meal intake; NPO  Comments/MD notified: Gherghe at bedside    Sherren Kerns

## 2018-08-31 NOTE — Transfer of Care (Signed)
Immediate Anesthesia Transfer of Care Note  Patient: [name]  Procedure(s) Performed: COLONOSCOPY WITH PROPOFOL (N/A ) BIOPSY  Patient Location: Endoscopy Unit  Anesthesia Type:MAC  Level of Consciousness: awake, alert  and oriented  Airway & Oxygen Therapy: Patient Spontanous Breathing and Patient connected to nasal cannula oxygen  Post-op Assessment: Report given to RN, Post -op Vital signs reviewed and stable and Patient moving all extremities X 4  Post vital signs: Reviewed and stable  Last Vitals:  Vitals Value Taken Time  BP 169/81 08/31/2018 11:39 AM  Temp    Pulse    Resp    SpO2    Vitals shown include unvalidated device data.  Last Pain:  Vitals:   08/31/18 1054  TempSrc: Oral  PainSc: 0-No pain         Complications: No apparent anesthesia complications

## 2018-09-01 LAB — GLUCOSE, CAPILLARY
Glucose-Capillary: 35 mg/dL — CL (ref 70–99)
Glucose-Capillary: 43 mg/dL — CL (ref 70–99)
Glucose-Capillary: 56 mg/dL — ABNORMAL LOW (ref 70–99)
Glucose-Capillary: 92 mg/dL (ref 70–99)

## 2018-09-01 LAB — TYPE AND SCREEN
ABO/RH(D): O POS
Antibody Screen: POSITIVE
DAT, IgG: NEGATIVE
DONOR AG TYPE: NEGATIVE
Donor AG Type: NEGATIVE
Donor AG Type: NEGATIVE
UNIT DIVISION: 0
Unit division: 0
Unit division: 0

## 2018-09-01 LAB — BPAM RBC
BLOOD PRODUCT EXPIRATION DATE: 202001182359
Blood Product Expiration Date: 202001182359
Blood Product Expiration Date: 202001302359
ISSUE DATE / TIME: 202001010038
Unit Type and Rh: 5100
Unit Type and Rh: 5100
Unit Type and Rh: 5100

## 2018-09-01 LAB — CBC
HEMATOCRIT: 31.9 % — AB (ref 36.0–46.0)
Hemoglobin: 9.9 g/dL — ABNORMAL LOW (ref 12.0–15.0)
MCH: 26.8 pg (ref 26.0–34.0)
MCHC: 31 g/dL (ref 30.0–36.0)
MCV: 86.4 fL (ref 80.0–100.0)
Platelets: 310 10*3/uL (ref 150–400)
RBC: 3.69 MIL/uL — ABNORMAL LOW (ref 3.87–5.11)
RDW: 15.9 % — ABNORMAL HIGH (ref 11.5–15.5)
WBC: 14.3 10*3/uL — ABNORMAL HIGH (ref 4.0–10.5)
nRBC: 0 % (ref 0.0–0.2)

## 2018-09-01 LAB — BASIC METABOLIC PANEL
Anion gap: 7 (ref 5–15)
BUN: 13 mg/dL (ref 8–23)
CO2: 18 mmol/L — AB (ref 22–32)
Calcium: 8.1 mg/dL — ABNORMAL LOW (ref 8.9–10.3)
Chloride: 109 mmol/L (ref 98–111)
Creatinine, Ser: 1.39 mg/dL — ABNORMAL HIGH (ref 0.44–1.00)
GFR calc Af Amer: 43 mL/min — ABNORMAL LOW (ref >60)
GFR calc non Af Amer: 37 mL/min — ABNORMAL LOW (ref >60)
Glucose, Bld: 153 mg/dL — ABNORMAL HIGH (ref 70–99)
Potassium: 4.1 mmol/L (ref 3.5–5.1)
SODIUM: 134 mmol/L — AB (ref 135–145)

## 2018-09-01 MED ORDER — GLIMEPIRIDE 4 MG PO TABS: 4.0000 mg | ORAL_TABLET | Freq: Every day | ORAL | 2 refills | Status: DC

## 2018-09-01 MED ORDER — GLUCOSE 4 G PO CHEW
CHEWABLE_TABLET | ORAL | Status: AC
  Administered 2018-09-01: 09:00:00
  Filled 2018-09-01: qty 1

## 2018-09-01 MED ORDER — METOPROLOL TARTRATE 25 MG PO TABS: 25.0000 mg | ORAL_TABLET | Freq: Two times a day (BID) | ORAL | 2 refills | Status: DC

## 2018-09-01 MED ORDER — ASPIRIN 81 MG PO TBEC: 81.0000 mg | DELAYED_RELEASE_TABLET | Freq: Every day | ORAL | 12 refills | Status: AC

## 2018-09-01 MED ORDER — LANTUS SOLOSTAR 100 UNIT/ML ~~LOC~~ SOPN: 26.0000 [IU] | PEN_INJECTOR | SUBCUTANEOUS | 11 refills | Status: DC

## 2018-09-01 MED ORDER — ACETAMINOPHEN 325 MG PO TABS: 650.0000 mg | ORAL_TABLET | Freq: Four times a day (QID) | ORAL | 1 refills | Status: AC | PRN

## 2018-09-01 NOTE — Progress Notes (Signed)
Hypoglycemic Event  CBG: 35  Treatment: 8 oz juice, 1 glucose tablet, peanut butter and graham crackers  Symptoms: None  Follow-up CBG: Time: 0845  CBG Result: 43  Follow-up CBG:  Time: 0920  CBG Result: 56  Follow-up CBG:  Time: 2449  CBG Result: 92  Possible Reasons for Event: medication regimen, inadequate meal intake  Hiram Comber

## 2018-09-01 NOTE — Discharge Instructions (Signed)
1)Avoid ibuprofen/Advil/Aleve/Motrin/Goody Powders/Naproxen/BC powders/Meloxicam/Diclofenac/Indomethacin and other Nonsteroidal anti-inflammatory medications as these will make you more likely to bleed and can cause stomach ulcers, can also cause Kidney problems.   2) repeat CBC and BMP test with primary care physician within a week

## 2018-09-01 NOTE — Discharge Summary (Signed)
[name], is a 73 y.o. female  DOB [birth year]  MRN [id number].  Admission date:  08/28/2018  Admitting Physician  Roxan Hockey, MD  Discharge Date:  09/01/2018   Primary MD  Nolene Ebbs, MD  Recommendations for primary care physician for things to follow:  1)Avoid ibuprofen/Advil/Aleve/Motrin/Goody Powders/Naproxen/BC powders/Meloxicam/Diclofenac/Indomethacin and other Nonsteroidal anti-inflammatory medications as these will make you more likely to bleed and can cause stomach ulcers, can also cause Kidney problems.   2) repeat CBC and BMP test with primary care physician within a week   Admission Diagnosis  Lactic acidosis [E87.2] Normocytic anemia [D64.9] Chest pain [R07.9] Gastrointestinal hemorrhage, unspecified gastrointestinal hemorrhage type [K92.2] GI bleed [K92.2]   Discharge Diagnosis  Lactic acidosis [E87.2] Normocytic anemia [D64.9] Chest pain [R07.9] Gastrointestinal hemorrhage, unspecified gastrointestinal hemorrhage type [K92.2] GI bleed [K92.2]   Principal Problem:   GI bleed Active Problems:   Anemia   COPD (chronic obstructive pulmonary disease) (HCC)   Sleep apnea   Controlled diabetes mellitus type 2 with complications ([place])   Acute renal failure superimposed on stage 3 chronic kidney disease (HCC)   PVD, LEIA/LIIA and RCIA pta 7/09- ABIs 0.68 and 0.69 Feb 2014   PUD (peptic ulcer disease) GI bleed in past (2011)   Cirrhosis of liver ([place])   Essential hypertension   AKI (acute kidney injury) ([place])      Past Medical History:  Diagnosis Date  . Acute venous embolism and thrombosis of deep vessels of proximal lower extremity ([place])   . Alcoholic cirrhosis ([place])   . Anemia   . Anxiety   . Arthritis   . Asthma   . Back pain   . Blood transfusion few years ago  . Cellulitis    bilateral lower extremities  . CHF (congestive heart failure) ([place]) 10/13   grade 1  diastolic dysfunction, Nl LVF  . Chronic diastolic congestive heart failure ([place])   . Chronic obstructive pulmonary disease (COPD) ([place])   . Colon polyps   . Depression   . Diabetes mellitus    insulin dep   . Gastroesophageal reflux   . GI bleeding 08/28/2018  . Glaucoma    left  . Gout   . Hepatitis    alcoholic hepatitis  . History of GI diverticular bleed   . Hypertension   . Leg swelling   . On home oxygen therapy    "2L just at night" (08/28/2018)  . Pneumonia    history of  . PVD (peripheral vascular disease) ([place]) 2009   bilat iliac stenting  . Reflux esophagitis   . Villous adenoma of colon 05/10/11    Past Surgical History:  Procedure Laterality Date  . ABDOMINAL HYSTERECTOMY  yrs ago  . CATARACT EXTRACTION Right yrs ago  . COLONOSCOPY    . COLONOSCOPY WITH PROPOFOL N/A 08/06/2013   Procedure: COLONOSCOPY WITH PROPOFOL;  Surgeon: Lear Ng, MD;  Location: [facility];  Service: Endoscopy;  Laterality: N/A;  . ESOPHAGOGASTRODUODENOSCOPY (EGD) WITH PROPOFOL N/A 08/06/2013   Procedure: ESOPHAGOGASTRODUODENOSCOPY (EGD) WITH  PROPOFOL;  Surgeon: Lear Ng, MD;  Location: [facility];  Service: Endoscopy;  Laterality: N/A;  . LAPAROSCOPIC ASSISSTED TOTAL COLECTOMY W/ J-POUCH  04/22/11  . VENTRAL HERNIA REPAIR  06/13/2012   Procedure: LAPAROSCOPIC VENTRAL HERNIA;  Surgeon: Adin Hector, MD;  Location: [facility];  Service: General;  Laterality: N/A;  Laparoscopic Assisted Ventral Hernia with Mesh       HPI  from the history and physical done on the day of admission:    [name]  is a 73 y.o. female with past medical history relevant for peptic ulcer disease, with prior history of diverticular bleed as well, chronic anemia, reformed alcoholic (last Etoh 2 yrs ago), reformed smoker, CKD III, PAD, alcoholic liver disease, , h/o HFpEF presents to the ED with fatigue, dizziness and concerns about blood with stools...  Patient denies frank  hematemesis, denies hematochezia-----she tells me her stools are like liver, and at times she has bright red blood in it.... No abdominal pain no flank pain no dysuria no fevers no chills no frank emesis.... She does have nausea and poor appetite  In ED--- patient is found to have hemoglobin of 6.1 down from 9.4 on 08/20/2018, with Hemoccult-positive stools  She is lethargic and a poor historian  , lactic acid is up to 2.2----.. potassium elevated at 5.5, troponin  0.02   Previous CBC sample hemolyzed, repeat CBC shows hemoglobin of 6.1 with a hematocrit of 20   Hospital Course:    Brief Summary 73 y.o.femalewith past medical history relevant for peptic ulcer disease, with prior history of diverticular bleed as well, chronic anemia, reformed alcoholic(last Etoh 2 yrs ago), reformed smoker,CKD III, PAD,alcoholic liver disease, , h/o HFpEF admitted on 08/28/2018 with fatigue, dizziness and concerns about blood with stools, found to have symptomatic anemia, received 1 unit of packed cells, EGD on 08/30/2018 unremarkable, awaiting colonoscopy on 08/31/2018   Plan:- 1)AcuteGI bleed---hemoglobin up to 9.9 after transfusion of 1 unit of PRBcs ,  No further  bloody stools ,fatigue and dizziness-- c/n  Protonix, patient with history of peptic ulcer disease and history of prior diverticular bleed,  [facility] consult  appreciated, EGD on 08/30/2018 unremarkable, colonoscopy on 08/31/2018 with polypectomy , internal and external hemorrhoids,  Hold naproxen,  2)AKIwith Hyperkalemia----acute kidney injury on CKD stage -III ,creatinine on admission=1.83, baseline creatinine = 1.5 to 1.6, improving with hydration, creatinine now is 1.3,  renally adjust medications, avoid nephrotoxic agents / dehydration / hypotension,   3)DM2-no recent A1c, resume home regimen including insulin and Amaryl and Januvia  4)HFpEF---last known EF 55 to 60%, continue metoprolol to 25 mg p.o. twice  daily, no shortness of breath at rest, dyspnea on exertion and dizziness improved significantly posttransfusion, restart lisinopril  5)acute on chronic anemia---secondary to #1 above with acute blood loss from GI source-treat as above #1  6)Alcoholic liver disease/Cirrhosis ---INR is 1.1, avoid hepatotoxic agents,last EGD in 2014 without esophageal varices, repeat EGD on 08/30/2018 also without esophageal varices  7)PADwith bilateral iliac stents---she is a reformed smoker,okay to resume aspirin and Pletal as per  GI  Code Status : Full  Family Communication:   na  Disposition Plan  : ?? Home   Consults  :  Gi  DVT Prophylaxis  :   - SCDs (Gi Bleed)  Discharge Condition: stable,   Follow UP--- as ordered  Diet and Activity recommendation:  As advised  Discharge Instructions    Discharge Instructions    Call MD for:  difficulty  breathing, headache or visual disturbances   Complete by:  As directed    Call MD for:  persistant dizziness or light-headedness   Complete by:  As directed    Call MD for:  persistant nausea and vomiting   Complete by:  As directed    Call MD for:  severe uncontrolled pain   Complete by:  As directed    Call MD for:  temperature >100.4   Complete by:  As directed    Diet - low sodium heart healthy   Complete by:  As directed    Discharge instructions   Complete by:  As directed    1)Avoid ibuprofen/Advil/Aleve/Motrin/Goody Powders/Naproxen/BC powders/Meloxicam/Diclofenac/Indomethacin and other Nonsteroidal anti-inflammatory medications as these will make you more likely to bleed and can cause stomach ulcers, can also cause Kidney problems.   2) repeat CBC and BMP test with primary care physician within a week   Increase activity slowly   Complete by:  As directed         Discharge Medications     Allergies as of 09/01/2018      Reactions   Compazine [prochlorperazine Edisylate] Other (See Comments)   Altered mental status    Penicillins Hives   Has patient had a PCN reaction causing immediate rash, facial/tongue/throat swelling, SOB or lightheadedness with hypotension: No Has patient had a PCN reaction causing severe rash involving mucus membranes or skin necrosis: No Has patient had a PCN reaction that required hospitalization No Has patient had a PCN reaction occurring within the last 10 years: No If all of the above answers are "NO", then may proceed with Cephalosporin use.   Phenergan [promethazine Hcl] Other (See Comments)   Altered mental status   Lipitor [atorvastatin] Swelling      Medication List    STOP taking these medications   naproxen 375 MG tablet Commonly known as:  NAPROSYN   tiZANidine 2 MG tablet Commonly known as:  ZANAFLEX     TAKE these medications   acetaminophen 325 MG tablet Commonly known as:  TYLENOL Take 2 tablets (650 mg total) by mouth every 6 (six) hours as needed for mild pain, fever or headache (or Fever >/= 101).   albuterol 108 (90 Base) MCG/ACT inhaler Commonly known as:  PROVENTIL HFA;VENTOLIN HFA Inhale 2 puffs into the lungs every 6 (six) hours as needed. For shortness of breath   albuterol (2.5 MG/3ML) 0.083% nebulizer solution Commonly known as:  PROVENTIL Take 2.5 mg by nebulization every 6 (six) hours as needed for wheezing or shortness of breath.   allopurinol 100 MG tablet Commonly known as:  ZYLOPRIM Take 100 mg by mouth daily.   aspirin 81 MG EC tablet Take 1 tablet (81 mg total) by mouth daily with breakfast. What changed:  when to take this   cilostazol 50 MG tablet Commonly known as:  PLETAL Take 50 mg by mouth 2 (two) times daily.   diclofenac sodium 1 % Gel Commonly known as:  VOLTAREN Apply 2 g topically 4 (four) times daily.   ergocalciferol 1.25 MG (50000 UT) capsule Commonly known as:  VITAMIN D2 Take 50,000 Units by mouth every Monday. On Monday   ferrous sulfate 325 (65 FE) MG tablet Take 325 mg by mouth daily with  breakfast.   fluticasone 50 MCG/ACT nasal spray Commonly known as:  FLONASE Place 2 sprays into the nose daily as needed for allergies.   folic acid 1 MG tablet Commonly known as:  FOLVITE Take 1 tablet (1  mg total) by mouth daily.   furosemide 20 MG tablet Commonly known as:  LASIX Take 1 tablet (20 mg total) by mouth daily.   glimepiride 4 MG tablet Commonly known as:  AMARYL Take 1 tablet (4 mg total) by mouth daily with breakfast. What changed:  when to take this   HYDROcodone-acetaminophen 5-325 MG tablet Commonly known as:  NORCO/VICODIN Take 1-2 tablets by mouth every 6 (six) hours as needed for severe pain.   JANUVIA 100 MG tablet Generic drug:  sitaGLIPtin Take 100 mg by mouth daily.   LANTUS SOLOSTAR 100 UNIT/ML Solostar Pen Generic drug:  Insulin Glargine Inject 26 Units into the skin every morning. What changed:    how much to take  how to take this   lisinopril 5 MG tablet Commonly known as:  PRINIVIL,ZESTRIL Take 5 mg by mouth daily.   magnesium oxide 400 MG tablet Commonly known as:  MAG-OX Take 400 mg by mouth daily.   methocarbamol 500 MG tablet Commonly known as:  ROBAXIN Take 1 tablet (500 mg total) by mouth every 8 (eight) hours as needed for muscle spasms.   metoprolol tartrate 25 MG tablet Commonly known as:  LOPRESSOR Take 1 tablet (25 mg total) by mouth 2 (two) times daily. What changed:    medication strength  how much to take  when to take this   montelukast 10 MG tablet Commonly known as:  SINGULAIR Take 10 mg by mouth at bedtime.   omeprazole 40 MG capsule Commonly known as:  PRILOSEC Take 40 mg by mouth daily.   OXYGEN Inhale 2 L into the lungs at bedtime. 2 liters nightly   simvastatin 10 MG tablet Commonly known as:  ZOCOR Take 10 mg by mouth daily.   thiamine 100 MG tablet Take 1 tablet (100 mg total) by mouth daily.   traMADol 50 MG tablet Commonly known as:  ULTRAM Take 1 tablet (50 mg total) by mouth  every 6 (six) hours as needed.   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 COMPARISON:  Head CT August 14, 2015 and brain MRI August 14, 2015 FINDINGS: Brain: There is mild age related volume loss. There is no intracranial mass, hemorrhage, extra-axial fluid collection, or midline shift. There is slight small vessel disease in the centra semiovale bilaterally. No acute infarct evident. Vascular: There is no hyperdense vessel. There is calcification in each carotid siphon region. Skull: Bony calvarium appears intact, although bones are osteoporotic. Sinuses/Orbits: There is mucosal thickening in several ethmoid air cells. Orbits appear symmetric bilaterally. Other: Mastoid air cells are clear. There is debris in each external auditory canal. IMPRESSION: Age related volume loss with slight periventricular small vessel disease. No mass or hemorrhage. No evident acute infarct. Foci of arterial vascular calcification noted. Mucosal thickening in several ethmoid air cells. Probable cerumen in each external auditory canal. Bones osteoporotic. Electronically Signed   By: Lowella Grip III M.D.   On: 08/28/2018 15:08   Dg Chest Port 1 View  Result Date: 08/28/2018 CLINICAL DATA:  Bloody stool.  Hematemesis. EXAM: PORTABLE CHEST 1 VIEW COMPARISON:  08/20/2018. FINDINGS: Mediastinum  and hilar structures normal. Stable coarse interstitial markings again noted. No acute infiltrate. No pleural effusion or pneumothorax. Degenerative changes and scoliosis thoracic spine. Diffuse osteopenia. IMPRESSION: Stable chronic change again  noted.  No acute abnormality identified. Electronically Signed   By: Marcello Moores  Register   On: 08/28/2018 13:20   Dg Chest Portable 1 View  Result Date: 08/20/2018 CLINICAL DATA:  Tachycardia. EXAM: PORTABLE CHEST 1 VIEW COMPARISON:  Chest x-ray dated November 30, 2017. FINDINGS: The heart size and mediastinal contours are within normal limits. Chronically coarsened interstitial markings are similar to prior studies. Unchanged bibasilar scarring. No focal consolidation, pleural effusion, or pneumothorax. No acute osseous abnormality. IMPRESSION: Stable chronic changes.  No active disease. Electronically Signed   By: Titus Dubin M.D.   On: 08/20/2018 17:39    Micro Results   No results found for this or any previous visit (from the past 240 hour(s)).  Today   Subjective    [name] today has no new complaints, No fever  Or chills, no nausea, vomiting, diarrhea      Patient has been seen and examined prior to discharge   Objective   Blood pressure (!) 167/91, pulse 98, temperature 98.7 F (37.1 C), temperature source Oral, resp. rate 20, height 5' (1.524 m), weight 58.1 kg, SpO2 100 %.   Intake/Output Summary (Last 24 hours) at 09/01/2018 1321 Last data filed at 08/31/2018 1500 Gross per 24 hour  Intake 161.1 ml  Output -  Net 161.1 ml    Exam Gen:- Awake Alert,  In no apparent distress  HEENT:- [place].AT, No sclera icterus Neck-Supple Neck,No JVD,.  Lungs-  CTAB , fair symmetrical air movement CV- S1, S2 normal, regular  Abd-  +ve B.Sounds, Abd Soft, No tenderness,    Extremity/Skin:- No  edema, pedal pulses present  Psych-affect is appropriate, oriented x3 Neuro-no new focal deficits, no tremors   Data Review   CBC w Diff:  Lab Results  Component Value Date   WBC 14.3 (H) 09/01/2018   HGB 9.9 (L) 09/01/2018   HCT 31.9 (L) 09/01/2018   PLT 310 09/01/2018   LYMPHOPCT 21 08/28/2018   MONOPCT 9 08/28/2018   EOSPCT 0 08/28/2018   BASOPCT 0 08/28/2018    CMP:  Lab  Results  Component Value Date   NA 134 (L) 09/01/2018   K 4.1 09/01/2018   CL 109 09/01/2018   CO2 18 (L) 09/01/2018   BUN 13 09/01/2018   CREATININE 1.39 (H) 09/01/2018   CREATININE 0.94 12/09/2014   PROT 6.4 (L) 08/28/2018   ALBUMIN 2.2 (L) 08/28/2018   BILITOT 0.4 08/28/2018   ALKPHOS 207 (H) 08/28/2018   AST 27 08/28/2018   ALT 11 08/28/2018   Total Discharge time is about 33 minutes  Roxan Hockey M.D on 09/01/2018 at 1:21 PM  Pager---[phone]  Go to [email or web address] - password [email or web address] for contact info  [facility] - Office  [phone]

## 2018-09-01 NOTE — Progress Notes (Signed)
Nsg Discharge Note  Admit Date:  08/28/2018 Discharge date: 09/01/2018   Ruthe Mannan to be D/C'd Home per MD order.  AVS completed.  Copy for chart, and copy for patient signed, and dated. Patient/caregiver able to verbalize understanding.  Discharge Medication: Allergies as of 09/01/2018      Reactions   Compazine [prochlorperazine Edisylate] Other (See Comments)   Altered mental status   Penicillins Hives   Has patient had a PCN reaction causing immediate rash, facial/tongue/throat swelling, SOB or lightheadedness with hypotension: No Has patient had a PCN reaction causing severe rash involving mucus membranes or skin necrosis: No Has patient had a PCN reaction that required hospitalization No Has patient had a PCN reaction occurring within the last 10 years: No If all of the above answers are "NO", then may proceed with Cephalosporin use.   Phenergan [promethazine Hcl] Other (See Comments)   Altered mental status   Lipitor [atorvastatin] Swelling      Medication List    STOP taking these medications   naproxen 375 MG tablet Commonly known as:  NAPROSYN   tiZANidine 2 MG tablet Commonly known as:  ZANAFLEX     TAKE these medications   acetaminophen 325 MG tablet Commonly known as:  TYLENOL Take 2 tablets (650 mg total) by mouth every 6 (six) hours as needed for mild pain, fever or headache (or Fever >/= 101).   albuterol 108 (90 Base) MCG/ACT inhaler Commonly known as:  PROVENTIL HFA;VENTOLIN HFA Inhale 2 puffs into the lungs every 6 (six) hours as needed. For shortness of breath   albuterol (2.5 MG/3ML) 0.083% nebulizer solution Commonly known as:  PROVENTIL Take 2.5 mg by nebulization every 6 (six) hours as needed for wheezing or shortness of breath.   allopurinol 100 MG tablet Commonly known as:  ZYLOPRIM Take 100 mg by mouth daily.   aspirin 81 MG EC tablet Take 1 tablet (81 mg total) by mouth daily with breakfast. What changed:  when to take this    cilostazol 50 MG tablet Commonly known as:  PLETAL Take 50 mg by mouth 2 (two) times daily.   diclofenac sodium 1 % Gel Commonly known as:  VOLTAREN Apply 2 g topically 4 (four) times daily.   ergocalciferol 1.25 MG (50000 UT) capsule Commonly known as:  VITAMIN D2 Take 50,000 Units by mouth every Monday. On Monday   ferrous sulfate 325 (65 FE) MG tablet Take 325 mg by mouth daily with breakfast.   fluticasone 50 MCG/ACT nasal spray Commonly known as:  FLONASE Place 2 sprays into the nose daily as needed for allergies.   folic acid 1 MG tablet Commonly known as:  FOLVITE Take 1 tablet (1 mg total) by mouth daily.   furosemide 20 MG tablet Commonly known as:  LASIX Take 1 tablet (20 mg total) by mouth daily.   glimepiride 4 MG tablet Commonly known as:  AMARYL Take 1 tablet (4 mg total) by mouth daily with breakfast. What changed:  when to take this   HYDROcodone-acetaminophen 5-325 MG tablet Commonly known as:  NORCO/VICODIN Take 1-2 tablets by mouth every 6 (six) hours as needed for severe pain.   JANUVIA 100 MG tablet Generic drug:  sitaGLIPtin Take 100 mg by mouth daily.   LANTUS SOLOSTAR 100 UNIT/ML Solostar Pen Generic drug:  Insulin Glargine Inject 26 Units into the skin every morning. What changed:    how much to take  how to take this   lisinopril 5 MG tablet Commonly  known as:  PRINIVIL,ZESTRIL Take 5 mg by mouth daily.   magnesium oxide 400 MG tablet Commonly known as:  MAG-OX Take 400 mg by mouth daily.   methocarbamol 500 MG tablet Commonly known as:  ROBAXIN Take 1 tablet (500 mg total) by mouth every 8 (eight) hours as needed for muscle spasms.   metoprolol tartrate 25 MG tablet Commonly known as:  LOPRESSOR Take 1 tablet (25 mg total) by mouth 2 (two) times daily. What changed:    medication strength  how much to take  when to take this   montelukast 10 MG tablet Commonly known as:  SINGULAIR Take 10 mg by mouth at bedtime.    omeprazole 40 MG capsule Commonly known as:  PRILOSEC Take 40 mg by mouth daily.   OXYGEN Inhale 2 L into the lungs at bedtime. 2 liters nightly   simvastatin 10 MG tablet Commonly known as:  ZOCOR Take 10 mg by mouth daily.   thiamine 100 MG tablet Take 1 tablet (100 mg total) by mouth daily.   traMADol 50 MG tablet Commonly known as:  ULTRAM Take 1 tablet (50 mg total) by mouth every 6 (six) hours as needed.   triamcinolone ointment 0.1 % Commonly known as:  KENALOG       Discharge Assessment: Vitals:   09/01/18 0547 09/01/18 0846  BP: (!) 145/77 (!) 167/91  Pulse: 77 98  Resp: 20   Temp: 98.7 F (37.1 C)   SpO2: 100%    Skin clean, dry and intact without evidence of skin break down, no evidence of skin tears noted. IV catheter discontinued intact. Site without signs and symptoms of complications - no redness or edema noted at insertion site, patient denies c/o pain - only slight tenderness at site.  Dressing with slight pressure applied.  D/c Instructions-Education: Discharge instructions given to patient/family with verbalized understanding. D/c education completed with patient/family including follow up instructions, medication list, d/c activities limitations if indicated, with other d/c instructions as indicated by MD - patient able to verbalize understanding, all questions fully answered. Patient instructed to return to ED, call 911, or call MD for any changes in condition.  Patient escorted via [place], and D/C home via private auto.  Hiram Comber, RN 09/01/2018 2:24 PM

## 2018-09-01 NOTE — Care Management Note (Signed)
Case Management Note  Patient Details  Name: [name] MRN: [id number] Date of Birth: July 16, 1946  Subjective/Objective: 73 yo F with anemia, possible cirrhosis, history of PUD, drop in Hb and maroon stools for a diagnostic EGD. Pt positive for GIB. PMH: COPD, DM, HTN                Action/Plan: Referral received to assist with [facility] PT and nursing   Expected Discharge Date:  09/01/18               Expected Discharge Plan:  [place]  In-House Referral:     Discharge planning Services  CM Consult  Post Acute Care Choice:  Home Health Choice offered to:  Patient  DME Arranged:    DME Agency:     HH Arranged:  PT, RN [place] Agency:  [place]  Status of Service:  Completed, signed off  If discussed at [place] of Stay Meetings, dates discussed:    Additional Comments: Contacted pt and discussed HH. She lives alone. She reports that her children live out of state. Her brother is able to assist her if prn. Discussed [place] referral. She doesn't have a preference for a [place] agency. She agreed to use [facility] for [facility] RN and PT. Contacted Jermaine at [facility] for referral.  Norina Buzzard, RN 09/01/2018, 3:30 PM

## 2018-09-02 ENCOUNTER — Encounter (HOSPITAL_COMMUNITY): Payer: Self-pay | Admitting: Gastroenterology

## 2018-09-03 LAB — GLUCOSE, CAPILLARY: Glucose-Capillary: 34 mg/dL — CL (ref 70–99)

## 2018-09-03 NOTE — Anesthesia Postprocedure Evaluation (Signed)
Anesthesia Post Note  Patient: [name]  Procedure(s) Performed: COLONOSCOPY WITH PROPOFOL (N/A ) BIOPSY     Patient location during evaluation: Endoscopy Anesthesia Type: MAC Level of consciousness: awake and alert Pain management: pain level controlled Vital Signs Assessment: post-procedure vital signs reviewed and stable Respiratory status: spontaneous breathing, nonlabored ventilation, respiratory function stable and patient connected to nasal cannula oxygen Cardiovascular status: stable and blood pressure returned to baseline Postop Assessment: no apparent nausea or vomiting Anesthetic complications: no    Last Vitals:  Vitals:   09/01/18 0547 09/01/18 0846  BP: (!) 145/77 (!) 167/91  Pulse: 77 98  Resp: 20   Temp: 37.1 C   SpO2: 100%     Last Pain:  Vitals:   09/01/18 0846  TempSrc:   PainSc: 0-No pain                 Catalina Gravel

## 2018-09-19 ENCOUNTER — Telehealth: Payer: Self-pay | Admitting: Cardiovascular Disease

## 2018-09-19 ENCOUNTER — Encounter: Payer: Self-pay | Admitting: Cardiovascular Disease

## 2018-09-19 NOTE — Telephone Encounter (Signed)
New message   Pt c/o swelling: STAT is pt has developed SOB within 24 hours  1) How much weight have you gained and in what time span?don't know  2) If swelling, where is the swelling located? Both legs   3) Are you currently taking a fluid pill? Yes   4) Are you currently SOB? No   5) Do you have a log of your daily weights (if so, list)? No   6) Have you gained 3 pounds in a day or 5 pounds in a week?don't know  7) Have you traveled recently?no   Please contact [name] with information.

## 2018-09-19 NOTE — Telephone Encounter (Signed)
Returned call to [employer] with [facility].She stated she is seeing patient for first time today since she was discharged from hospital 09/01/18.Stated patient is not taking medications as instructed on discharge summary.Stated both lower legs are swollen.She has been taking Lasix 40 mg daily instead of Lasix 20 mg daily listed on discharge summary.Stated she is concerned patient not taking medications correctly.She will prepare medications for her in a pill box.Patient's scales not working.She will get patient new scales.Stated her blood sugar is 334.She will call PCP about blood sugar.She will do lab today bmet,bnp,cbc.She will fax results to Dr.Berry.Appointment scheduled with Almyra Deforest PA 09/24/18 at 1:30 pm.Advised to bring all medications to appointment.

## 2018-09-21 ENCOUNTER — Observation Stay (HOSPITAL_COMMUNITY)
Admission: EM | Admit: 2018-09-21 | Discharge: 2018-09-26 | Disposition: A | Discharge status: Skilled Nursing Facility | Payer: Medicare Other | Attending: Internal Medicine | Admitting: Internal Medicine

## 2018-09-21 ENCOUNTER — Encounter (HOSPITAL_COMMUNITY): Payer: Self-pay | Admitting: Emergency Medicine

## 2018-09-21 DIAGNOSIS — Z808 Family history of malignant neoplasm of other organs or systems: Secondary | ICD-10-CM | POA: Insufficient documentation

## 2018-09-21 DIAGNOSIS — K648 Other hemorrhoids: Principal | ICD-10-CM | POA: Diagnosis present

## 2018-09-21 DIAGNOSIS — Z9841 Cataract extraction status, right eye: Secondary | ICD-10-CM | POA: Insufficient documentation

## 2018-09-21 DIAGNOSIS — Z9981 Dependence on supplemental oxygen: Secondary | ICD-10-CM | POA: Diagnosis not present

## 2018-09-21 DIAGNOSIS — G9340 Encephalopathy, unspecified: Secondary | ICD-10-CM | POA: Insufficient documentation

## 2018-09-21 DIAGNOSIS — E1122 Type 2 diabetes mellitus with diabetic chronic kidney disease: Secondary | ICD-10-CM | POA: Diagnosis not present

## 2018-09-21 DIAGNOSIS — E669 Obesity, unspecified: Secondary | ICD-10-CM | POA: Insufficient documentation

## 2018-09-21 DIAGNOSIS — K219 Gastro-esophageal reflux disease without esophagitis: Secondary | ICD-10-CM | POA: Diagnosis not present

## 2018-09-21 DIAGNOSIS — Z86718 Personal history of other venous thrombosis and embolism: Secondary | ICD-10-CM | POA: Insufficient documentation

## 2018-09-21 DIAGNOSIS — C227 Other specified carcinomas of liver: Secondary | ICD-10-CM | POA: Diagnosis not present

## 2018-09-21 DIAGNOSIS — M1990 Unspecified osteoarthritis, unspecified site: Secondary | ICD-10-CM | POA: Diagnosis not present

## 2018-09-21 DIAGNOSIS — D649 Anemia, unspecified: Secondary | ICD-10-CM | POA: Diagnosis present

## 2018-09-21 DIAGNOSIS — K920 Hematemesis: Secondary | ICD-10-CM | POA: Insufficient documentation

## 2018-09-21 DIAGNOSIS — K7031 Alcoholic cirrhosis of liver with ascites: Secondary | ICD-10-CM | POA: Diagnosis not present

## 2018-09-21 DIAGNOSIS — I130 Hypertensive heart and chronic kidney disease with heart failure and stage 1 through stage 4 chronic kidney disease, or unspecified chronic kidney disease: Secondary | ICD-10-CM | POA: Diagnosis not present

## 2018-09-21 DIAGNOSIS — M109 Gout, unspecified: Secondary | ICD-10-CM | POA: Diagnosis not present

## 2018-09-21 DIAGNOSIS — Z79899 Other long term (current) drug therapy: Secondary | ICD-10-CM | POA: Insufficient documentation

## 2018-09-21 DIAGNOSIS — N183 Chronic kidney disease, stage 3 unspecified: Secondary | ICD-10-CM | POA: Diagnosis present

## 2018-09-21 DIAGNOSIS — R160 Hepatomegaly, not elsewhere classified: Secondary | ICD-10-CM

## 2018-09-21 DIAGNOSIS — Z86010 Personal history of colonic polyps: Secondary | ICD-10-CM | POA: Insufficient documentation

## 2018-09-21 DIAGNOSIS — I5032 Chronic diastolic (congestive) heart failure: Secondary | ICD-10-CM | POA: Insufficient documentation

## 2018-09-21 DIAGNOSIS — Z85038 Personal history of other malignant neoplasm of large intestine: Secondary | ICD-10-CM | POA: Insufficient documentation

## 2018-09-21 DIAGNOSIS — E875 Hyperkalemia: Secondary | ICD-10-CM | POA: Insufficient documentation

## 2018-09-21 DIAGNOSIS — E876 Hypokalemia: Secondary | ICD-10-CM | POA: Insufficient documentation

## 2018-09-21 DIAGNOSIS — Z888 Allergy status to other drugs, medicaments and biological substances status: Secondary | ICD-10-CM | POA: Insufficient documentation

## 2018-09-21 DIAGNOSIS — Z880 Allergy status to penicillin: Secondary | ICD-10-CM | POA: Insufficient documentation

## 2018-09-21 DIAGNOSIS — K625 Hemorrhage of anus and rectum: Secondary | ICD-10-CM | POA: Diagnosis not present

## 2018-09-21 DIAGNOSIS — D72829 Elevated white blood cell count, unspecified: Secondary | ICD-10-CM | POA: Diagnosis present

## 2018-09-21 DIAGNOSIS — J449 Chronic obstructive pulmonary disease, unspecified: Secondary | ICD-10-CM | POA: Diagnosis not present

## 2018-09-21 DIAGNOSIS — H409 Unspecified glaucoma: Secondary | ICD-10-CM | POA: Diagnosis not present

## 2018-09-21 DIAGNOSIS — N179 Acute kidney failure, unspecified: Secondary | ICD-10-CM | POA: Insufficient documentation

## 2018-09-21 DIAGNOSIS — M549 Dorsalgia, unspecified: Secondary | ICD-10-CM | POA: Insufficient documentation

## 2018-09-21 DIAGNOSIS — E118 Type 2 diabetes mellitus with unspecified complications: Secondary | ICD-10-CM | POA: Diagnosis present

## 2018-09-21 DIAGNOSIS — H5461 Unqualified visual loss, right eye, normal vision left eye: Secondary | ICD-10-CM | POA: Insufficient documentation

## 2018-09-21 DIAGNOSIS — N2889 Other specified disorders of kidney and ureter: Secondary | ICD-10-CM | POA: Insufficient documentation

## 2018-09-21 DIAGNOSIS — F329 Major depressive disorder, single episode, unspecified: Secondary | ICD-10-CM | POA: Diagnosis not present

## 2018-09-21 DIAGNOSIS — G4730 Sleep apnea, unspecified: Secondary | ICD-10-CM | POA: Insufficient documentation

## 2018-09-21 DIAGNOSIS — D62 Acute posthemorrhagic anemia: Secondary | ICD-10-CM | POA: Diagnosis not present

## 2018-09-21 DIAGNOSIS — K922 Gastrointestinal hemorrhage, unspecified: Secondary | ICD-10-CM

## 2018-09-21 DIAGNOSIS — Z6823 Body mass index (BMI) 23.0-23.9, adult: Secondary | ICD-10-CM | POA: Insufficient documentation

## 2018-09-21 DIAGNOSIS — I739 Peripheral vascular disease, unspecified: Secondary | ICD-10-CM | POA: Diagnosis present

## 2018-09-21 DIAGNOSIS — R0789 Other chest pain: Secondary | ICD-10-CM | POA: Insufficient documentation

## 2018-09-21 DIAGNOSIS — I10 Essential (primary) hypertension: Secondary | ICD-10-CM | POA: Diagnosis present

## 2018-09-21 DIAGNOSIS — E1151 Type 2 diabetes mellitus with diabetic peripheral angiopathy without gangrene: Secondary | ICD-10-CM | POA: Insufficient documentation

## 2018-09-21 DIAGNOSIS — Z794 Long term (current) use of insulin: Secondary | ICD-10-CM | POA: Insufficient documentation

## 2018-09-21 DIAGNOSIS — K5730 Diverticulosis of large intestine without perforation or abscess without bleeding: Secondary | ICD-10-CM | POA: Insufficient documentation

## 2018-09-21 DIAGNOSIS — K449 Diaphragmatic hernia without obstruction or gangrene: Secondary | ICD-10-CM | POA: Insufficient documentation

## 2018-09-21 DIAGNOSIS — I700 Atherosclerosis of aorta: Secondary | ICD-10-CM | POA: Insufficient documentation

## 2018-09-21 DIAGNOSIS — F419 Anxiety disorder, unspecified: Secondary | ICD-10-CM | POA: Insufficient documentation

## 2018-09-21 DIAGNOSIS — F17210 Nicotine dependence, cigarettes, uncomplicated: Secondary | ICD-10-CM | POA: Insufficient documentation

## 2018-09-21 DIAGNOSIS — E739 Lactose intolerance, unspecified: Secondary | ICD-10-CM | POA: Insufficient documentation

## 2018-09-21 DIAGNOSIS — E785 Hyperlipidemia, unspecified: Secondary | ICD-10-CM | POA: Insufficient documentation

## 2018-09-21 DIAGNOSIS — Z91018 Allergy to other foods: Secondary | ICD-10-CM | POA: Insufficient documentation

## 2018-09-21 DIAGNOSIS — K644 Residual hemorrhoidal skin tags: Secondary | ICD-10-CM | POA: Insufficient documentation

## 2018-09-21 DIAGNOSIS — Z90710 Acquired absence of both cervix and uterus: Secondary | ICD-10-CM | POA: Diagnosis not present

## 2018-09-21 DIAGNOSIS — Z7982 Long term (current) use of aspirin: Secondary | ICD-10-CM | POA: Insufficient documentation

## 2018-09-21 DIAGNOSIS — Z800 Family history of malignant neoplasm of digestive organs: Secondary | ICD-10-CM | POA: Insufficient documentation

## 2018-09-21 LAB — GLUCOSE, CAPILLARY: GLUCOSE-CAPILLARY: 226 mg/dL — AB (ref 70–99)

## 2018-09-21 LAB — CBC
HCT: 26.4 % — ABNORMAL LOW (ref 36.0–46.0)
Hemoglobin: 8.1 g/dL — ABNORMAL LOW (ref 12.0–15.0)
MCH: 26.2 pg (ref 26.0–34.0)
MCHC: 30.7 g/dL (ref 30.0–36.0)
MCV: 85.4 fL (ref 80.0–100.0)
Platelets: 415 10*3/uL — ABNORMAL HIGH (ref 150–400)
RBC: 3.09 MIL/uL — ABNORMAL LOW (ref 3.87–5.11)
RDW: 15.6 % — ABNORMAL HIGH (ref 11.5–15.5)
WBC: 13.8 10*3/uL — ABNORMAL HIGH (ref 4.0–10.5)
nRBC: 0 % (ref 0.0–0.2)

## 2018-09-21 LAB — CBC WITH DIFFERENTIAL/PLATELET
ABS IMMATURE GRANULOCYTES: 0.07 10*3/uL (ref 0.00–0.07)
Basophils Absolute: 0.1 10*3/uL (ref 0.0–0.1)
Basophils Relative: 0 %
Eosinophils Absolute: 0 10*3/uL (ref 0.0–0.5)
Eosinophils Relative: 0 %
HCT: 28.5 % — ABNORMAL LOW (ref 36.0–46.0)
HEMOGLOBIN: 8.4 g/dL — AB (ref 12.0–15.0)
Immature Granulocytes: 1 %
Lymphocytes Relative: 17 %
Lymphs Abs: 2.6 10*3/uL (ref 0.7–4.0)
MCH: 25.6 pg — ABNORMAL LOW (ref 26.0–34.0)
MCHC: 29.5 g/dL — ABNORMAL LOW (ref 30.0–36.0)
MCV: 86.9 fL (ref 80.0–100.0)
Monocytes Absolute: 1 10*3/uL (ref 0.1–1.0)
Monocytes Relative: 6 %
NEUTROS ABS: 11.7 10*3/uL — AB (ref 1.7–7.7)
NEUTROS PCT: 76 %
Platelets: 421 10*3/uL — ABNORMAL HIGH (ref 150–400)
RBC: 3.28 MIL/uL — ABNORMAL LOW (ref 3.87–5.11)
RDW: 15.4 % (ref 11.5–15.5)
WBC: 15.4 10*3/uL — ABNORMAL HIGH (ref 4.0–10.5)
nRBC: 0 % (ref 0.0–0.2)

## 2018-09-21 LAB — COMPREHENSIVE METABOLIC PANEL
ALT: 11 U/L (ref 0–44)
AST: 33 U/L (ref 15–41)
Albumin: 2.1 g/dL — ABNORMAL LOW (ref 3.5–5.0)
Alkaline Phosphatase: 214 U/L — ABNORMAL HIGH (ref 38–126)
Anion gap: 9 (ref 5–15)
BUN: 29 mg/dL — ABNORMAL HIGH (ref 8–23)
CHLORIDE: 104 mmol/L (ref 98–111)
CO2: 24 mmol/L (ref 22–32)
Calcium: 8.8 mg/dL — ABNORMAL LOW (ref 8.9–10.3)
Creatinine, Ser: 1.79 mg/dL — ABNORMAL HIGH (ref 0.44–1.00)
GFR calc Af Amer: 32 mL/min — ABNORMAL LOW (ref >60)
GFR calc non Af Amer: 27 mL/min — ABNORMAL LOW (ref >60)
Glucose, Bld: 202 mg/dL — ABNORMAL HIGH (ref 70–99)
Potassium: 5.3 mmol/L — ABNORMAL HIGH (ref 3.5–5.1)
Sodium: 137 mmol/L (ref 135–145)
Total Bilirubin: 0.5 mg/dL (ref 0.3–1.2)
Total Protein: 6 g/dL — ABNORMAL LOW (ref 6.5–8.1)

## 2018-09-21 LAB — PROTIME-INR
INR: 1.05
Prothrombin Time: 13.6 seconds (ref 11.4–15.2)

## 2018-09-21 LAB — POC OCCULT BLOOD, ED: Fecal Occult Bld: POSITIVE — AB

## 2018-09-21 LAB — CBG MONITORING, ED: Glucose-Capillary: 142 mg/dL — ABNORMAL HIGH (ref 70–99)

## 2018-09-21 MED ORDER — POTASSIUM CHLORIDE IN NACL 20-0.9 MEQ/L-% IV SOLN
INTRAVENOUS | Status: DC
  Filled 2018-09-21: qty 1000

## 2018-09-21 MED ORDER — SODIUM CHLORIDE 0.9 % IV BOLUS
500.0000 mL | Freq: Once | INTRAVENOUS | Status: AC
  Administered 2018-09-21: 500 mL via INTRAVENOUS

## 2018-09-21 MED ORDER — TRAZODONE HCL 50 MG PO TABS: 25.0000 mg | ORAL_TABLET | Freq: Every evening | ORAL | Status: DC | PRN

## 2018-09-21 MED ORDER — ACETAMINOPHEN 325 MG PO TABS
650.0000 mg | ORAL_TABLET | Freq: Four times a day (QID) | ORAL | Status: DC | PRN
  Administered 2018-09-23: 650 mg via ORAL
  Filled 2018-09-21: qty 2

## 2018-09-21 MED ORDER — OXYCODONE HCL 5 MG PO TABS
5.0000 mg | ORAL_TABLET | ORAL | Status: DC | PRN
  Administered 2018-09-22 – 2018-09-26 (×6): 5 mg via ORAL
  Filled 2018-09-21 (×6): qty 1

## 2018-09-21 MED ORDER — ACETAMINOPHEN 650 MG RE SUPP: 650.0000 mg | Freq: Four times a day (QID) | RECTAL | Status: DC | PRN

## 2018-09-21 MED ORDER — SODIUM CHLORIDE 0.9 % IV SOLN: 250.0000 mL | INTRAVENOUS | Status: DC | PRN

## 2018-09-21 MED ORDER — SODIUM CHLORIDE 0.9% FLUSH
3.0000 mL | Freq: Two times a day (BID) | INTRAVENOUS | Status: DC
  Administered 2018-09-21 – 2018-09-26 (×7): 3 mL via INTRAVENOUS

## 2018-09-21 MED ORDER — PANTOPRAZOLE SODIUM 40 MG IV SOLR
40.0000 mg | Freq: Once | INTRAVENOUS | Status: AC
  Administered 2018-09-21: 40 mg via INTRAVENOUS
  Filled 2018-09-21: qty 40

## 2018-09-21 MED ORDER — SODIUM CHLORIDE 0.9% FLUSH: 3.0000 mL | INTRAVENOUS | Status: DC | PRN

## 2018-09-21 MED ORDER — SODIUM CHLORIDE 0.9 % IV SOLN
INTRAVENOUS | Status: DC
  Administered 2018-09-21 – 2018-09-23 (×3): via INTRAVENOUS

## 2018-09-21 NOTE — ED Triage Notes (Signed)
Pt recently seen for GI bleed. Pt had a bowel movement this morning with bright red blood, continued to have bloody stools throughout the day. BP 130/80, HR 94. SR on the monitor. Pt also complains that she coughed up some blood.

## 2018-09-21 NOTE — ED Provider Notes (Signed)
Received signout at the beginning of shift.  Patient here with GI bleed felt to be lower GI bleed.  Was seen by GI specialist during last hospitalization.  Suspect hemorrhoidal bleeding based on recent colonoscopy 3 weeks ago with normal EGD.  Her hemoglobin is 8.4 today and she is being admitted for observation and serial CBC.  5:59 PM GI specialist have been consulted and will see pt.  BP (!) 141/78   Pulse 95   Temp 98.3 F (36.8 C) (Oral)   Resp (!) 24   SpO2 96%   Results for orders placed or performed during the hospital encounter of 09/21/18  Comprehensive metabolic panel  Result Value Ref Range   Sodium 137 135 - 145 mmol/L   Potassium 5.3 (H) 3.5 - 5.1 mmol/L   Chloride 104 98 - 111 mmol/L   CO2 24 22 - 32 mmol/L   Glucose, Bld 202 (H) 70 - 99 mg/dL   BUN 29 (H) 8 - 23 mg/dL   Creatinine, Ser 1.79 (H) 0.44 - 1.00 mg/dL   Calcium 8.8 (L) 8.9 - 10.3 mg/dL   Total Protein 6.0 (L) 6.5 - 8.1 g/dL   Albumin 2.1 (L) 3.5 - 5.0 g/dL   AST 33 15 - 41 U/L   ALT 11 0 - 44 U/L   Alkaline Phosphatase 214 (H) 38 - 126 U/L   Total Bilirubin 0.5 0.3 - 1.2 mg/dL   GFR calc non Af Amer 27 (L) >60 mL/min   GFR calc Af Amer 32 (L) >60 mL/min   Anion gap 9 5 - 15  CBC WITH DIFFERENTIAL  Result Value Ref Range   WBC 15.4 (H) 4.0 - 10.5 K/uL   RBC 3.28 (L) 3.87 - 5.11 MIL/uL   Hemoglobin 8.4 (L) 12.0 - 15.0 g/dL   HCT 28.5 (L) 36.0 - 46.0 %   MCV 86.9 80.0 - 100.0 fL   MCH 25.6 (L) 26.0 - 34.0 pg   MCHC 29.5 (L) 30.0 - 36.0 g/dL   RDW 15.4 11.5 - 15.5 %   Platelets 421 (H) 150 - 400 K/uL   nRBC 0.0 0.0 - 0.2 %   Neutrophils Relative % 76 %   Neutro Abs 11.7 (H) 1.7 - 7.7 K/uL   Lymphocytes Relative 17 %   Lymphs Abs 2.6 0.7 - 4.0 K/uL   Monocytes Relative 6 %   Monocytes Absolute 1.0 0.1 - 1.0 K/uL   Eosinophils Relative 0 %   Eosinophils Absolute 0.0 0.0 - 0.5 K/uL   Basophils Relative 0 %   Basophils Absolute 0.1 0.0 - 0.1 K/uL   Immature Granulocytes 1 %   Abs Immature  Granulocytes 0.07 0.00 - 0.07 K/uL  Protime-INR  Result Value Ref Range   Prothrombin Time 13.6 11.4 - 15.2 seconds   INR 1.05   POC occult blood, ED  Result Value Ref Range   Fecal Occult Bld POSITIVE (A) NEGATIVE  Type and screen [place]  Result Value Ref Range   ABO/RH(D) O POS    Antibody Screen NEG    Sample Expiration      09/24/2018 Performed at [facility], 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 COMPARISON:  Head CT August 14, 2015 and brain MRI  August 14, 2015 FINDINGS: Brain: There is mild age related volume loss. There is no intracranial mass, hemorrhage, extra-axial fluid collection, or midline shift. There is slight small vessel disease in the centra semiovale bilaterally. No acute infarct evident. Vascular: There is no hyperdense vessel. There is calcification in each carotid siphon region. Skull: Bony calvarium appears intact, although bones are osteoporotic. Sinuses/Orbits: There is mucosal thickening in several ethmoid air cells. Orbits appear symmetric bilaterally. Other: Mastoid air cells are clear. There is debris in each external auditory canal. IMPRESSION: Age related volume loss with slight periventricular small vessel disease. No mass or hemorrhage. No evident acute infarct. Foci of arterial vascular calcification noted. Mucosal thickening in several ethmoid air cells. Probable cerumen in each external auditory canal. Bones osteoporotic. Electronically Signed   By: Lowella Grip III M.D.   On: 08/28/2018 15:08   Dg Chest Port 1 View  Result Date: 08/28/2018 CLINICAL DATA:  Bloody stool.  Hematemesis. EXAM: PORTABLE CHEST 1 VIEW COMPARISON:  08/20/2018. FINDINGS: Mediastinum and hilar  structures normal. Stable coarse interstitial markings again noted. No acute infiltrate. No pleural effusion or pneumothorax. Degenerative changes and scoliosis thoracic spine. Diffuse osteopenia. IMPRESSION: Stable chronic change again noted.  No acute abnormality identified. Electronically Signed   By: Marcello Moores  Register   On: 08/28/2018 13:20      Domenic Moras, PA-C 09/21/18 1759    Fredia Sorrow, MD 09/23/18 1540

## 2018-09-21 NOTE — H&P (Signed)
History and Physical    [name] ZOX:[id number] DOB: 07-16-1946 DOA: 09/21/2018  PCP: Nolene Ebbs, MD  Patient coming from: Home  I have personally briefly reviewed patient's old medical records in [place]  Chief Complaint: Bright red blood per rectum  HPI: [name] is a 73 y.o. 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  She has had multiple bloody stools this morning.  Her stools have been loose.  She noticed just this morning did not notice any blood in her stool yesterday.  Bowel movements are painless and she did not have any associated abdominal cramping.  She has no hematemesis associated with this.  3 weeks ago the patient was admitted on December 31 with GI bleeding.  She had a colonoscopy which showed reticulosis of the sigmoid and descending colon internal and external hemorrhoids no other active bleeding and a normal EGD.  She did not have varices.  She denies any alcohol use currently.  She does have a history of alcohol use in the past but quit over 2 years ago.  She reports aspirin use but does not use any other blood thinner thinner and has stopped using her Naprosyn.  She is very difficult to get a history from as she is confused and states that I should talk to her CNA.  She denies any chest pain, shortness of breath, dizziness, lightheadedness syncope or presyncope.  ED Course: Hemoglobin 8.4 which is dropped 1.5 g since January 4, mild acute kidney injury with a creatinine of 1.79, slightly tachycardic with a heart rate of 96 otherwise unremarkable  Review of Systems: As per HPI otherwise all other systems reviewed and  negative.    Past Medical History:  Diagnosis Date  . Acute venous embolism and  thrombosis of deep vessels of proximal lower extremity ([place])   . Alcoholic cirrhosis ([place])   . Anemia   . Anxiety   . Arthritis   . Asthma   . Back pain   . Blood transfusion few years ago  . Cellulitis    bilateral lower extremities  . CHF (congestive heart failure) ([place]) 10/13   grade 1 diastolic dysfunction, Nl LVF  . Chronic diastolic congestive heart failure ([place])   . Chronic obstructive pulmonary disease (COPD) ([place])   . Colon polyps   . Depression   . Diabetes mellitus    insulin dep   . Gastroesophageal reflux   . GI bleeding 08/28/2018  . Glaucoma    left  . Gout   . Hepatitis    alcoholic hepatitis  . History of GI diverticular bleed   . Hypertension   . Leg swelling   . On home oxygen therapy    "2L just at night" (08/28/2018)  . Pneumonia    history of  . PVD (peripheral vascular disease) ([place]) 2009   bilat iliac stenting  . Reflux esophagitis   . Villous adenoma of colon 05/10/11    Past Surgical History:  Procedure Laterality Date  . ABDOMINAL HYSTERECTOMY  yrs ago  . BIOPSY  08/31/2018   Procedure: BIOPSY;  Surgeon: [name], MD;  Location: [facility];  Service: Gastroenterology;;  . CATARACT EXTRACTION Right yrs ago  . COLONOSCOPY    . COLONOSCOPY WITH PROPOFOL N/A 08/06/2013  Procedure: COLONOSCOPY WITH PROPOFOL;  Surgeon: Lear Ng, MD;  Location: [facility];  Service: Endoscopy;  Laterality: N/A;  . COLONOSCOPY WITH PROPOFOL N/A 08/31/2018   Procedure: COLONOSCOPY WITH PROPOFOL;  Surgeon: [name], MD;  Location: [facility];  Service: Gastroenterology;  Laterality: N/A;  . ESOPHAGOGASTRODUODENOSCOPY (EGD) WITH PROPOFOL N/A 08/06/2013   Procedure: ESOPHAGOGASTRODUODENOSCOPY (EGD) WITH PROPOFOL;  Surgeon: Lear Ng, MD;  Location: [facility];  Service: Endoscopy;  Laterality: N/A;  . ESOPHAGOGASTRODUODENOSCOPY (EGD) WITH PROPOFOL N/A 08/30/2018   Procedure: ESOPHAGOGASTRODUODENOSCOPY (EGD) WITH PROPOFOL;  Surgeon: [name], MD;  Location: [facility];  Service: Gastroenterology;  Laterality: N/A;  . LAPAROSCOPIC ASSISSTED TOTAL COLECTOMY W/ J-POUCH  04/22/11  . VENTRAL HERNIA REPAIR  06/13/2012   Procedure: LAPAROSCOPIC VENTRAL HERNIA;  Surgeon: Adin Hector, MD;  Location: [facility];  Service: General;  Laterality: N/A;  Laparoscopic Assisted Ventral Hernia with Mesh    Social History   Social History Narrative  . Not on file     reports that she has been smoking cigarettes. She has a 15.00 pack-year smoking history. She has never used smokeless tobacco. She reports that she does not drink alcohol or use drugs.  Allergies  Allergen Reactions  . Compazine [Prochlorperazine Edisylate] Other (See Comments)    Altered mental status  . Penicillins Hives    Has patient had a PCN reaction causing immediate rash, facial/tongue/throat swelling, SOB or lightheadedness with hypotension: Yes Has patient had a PCN reaction causing severe rash involving mucus membranes or skin necrosis: No Has patient had a PCN reaction that required hospitalization: No Has patient had a PCN reaction occurring within the last 10 years: No If all of the above answers are "NO", then may proceed with Cephalosporin use.   Marland Kitchen Phenergan [Promethazine Hcl] Other (See Comments)    Altered mental status  . Lipitor [Atorvastatin] Swelling    Body swells, but doesn't affect breathing  . Chocolate Diarrhea  . Lactose Intolerance (Gi) Diarrhea  . Other Other (See Comments)    Nuts- Diverticulitis  . Pork-Derived Products Other (See Comments)    Causes GOUT    Family History  Problem Relation Age of Onset  . Cancer Brother        heent ca  . Cancer Sister        liver ca     Prior to Admission medications   Medication Sig Start Date End Date Taking? Authorizing Provider  acetaminophen (TYLENOL) 325 MG tablet Take 2 tablets (650 mg total) by mouth every 6 (six) hours as needed for mild pain, fever or headache (or Fever >/=  101). 09/01/18   Roxan Hockey, MD  albuterol (PROVENTIL HFA;VENTOLIN HFA) 108 (90 BASE) MCG/ACT inhaler Inhale 2 puffs into the lungs every 6 (six) hours as needed. For shortness of breath    [provider]  albuterol (PROVENTIL) (2.5 MG/3ML) 0.083% nebulizer solution Take 2.5 mg by nebulization every 6 (six) hours as needed for wheezing or shortness of breath.    [provider]  allopurinol (ZYLOPRIM) 100 MG tablet Take 100 mg by mouth daily.  01/18/16   [provider]  aspirin 81 MG EC tablet Take 1 tablet (81 mg total) by mouth daily with breakfast. 09/01/18   Roxan Hockey, MD  cilostazol (PLETAL) 100 MG tablet Take 50 mg by mouth 2 (two) times daily. 08/07/18   [provider]  cilostazol (PLETAL) 50 MG tablet Take 50 mg by mouth 2 (two) times daily.  01/18/16  [provider]  diclofenac sodium (VOLTAREN) 1 % GEL Apply 2 g topically 4 (four) times daily.  05/21/18   [provider]  ergocalciferol (VITAMIN D2) 50000 UNITS capsule Take 50,000 Units by mouth every Monday. On Monday    [provider]  ferrous sulfate 325 (65 FE) MG tablet Take 325 mg by mouth daily with breakfast.    [provider]  fluticasone (FLONASE) 50 MCG/ACT nasal spray Place 2 sprays into the nose daily as needed for allergies.     [provider]  folic acid (FOLVITE) 1 MG tablet Take 1 tablet (1 mg total) by mouth daily. 08/18/15   Hongalgi, Lenis Dickinson, MD  furosemide (LASIX) 20 MG tablet Take 1 tablet (20 mg total) by mouth daily. 08/18/15   Hongalgi, Lenis Dickinson, MD  furosemide (LASIX) 40 MG tablet Take 40 mg by mouth daily as needed (for swelling of the legs).  09/10/18   [provider]  glimepiride (AMARYL) 4 MG tablet Take 1 tablet (4 mg total) by mouth daily with breakfast. 09/01/18   Roxan Hockey, MD  HYDROcodone-acetaminophen (NORCO/VICODIN) 5-325 MG tablet Take 1-2 tablets by mouth every 6 (six) hours as needed for severe  pain. Patient not taking: Reported on 08/20/2018 01/24/17   Little, Wenda Overland, MD  LANTUS SOLOSTAR 100 UNIT/ML Solostar Pen Inject 26 Units into the skin every morning. 09/01/18   Roxan Hockey, MD  lisinopril (PRINIVIL,ZESTRIL) 5 MG tablet Take 5 mg by mouth daily. 01/18/16   [provider]  magnesium oxide (MAG-OX) 400 MG tablet Take 400 mg by mouth daily.    [provider]  methocarbamol (ROBAXIN) 500 MG tablet Take 1 tablet (500 mg total) by mouth every 8 (eight) hours as needed for muscle spasms. 02/17/16   Mesner, Corene Cornea, MD  metoprolol tartrate (LOPRESSOR) 25 MG tablet Take 1 tablet (25 mg total) by mouth 2 (two) times daily. 09/01/18   Roxan Hockey, MD  metoprolol tartrate (LOPRESSOR) 50 MG tablet  09/14/18   [provider]  montelukast (SINGULAIR) 10 MG tablet Take 10 mg by mouth at bedtime.     [provider]  omeprazole (PRILOSEC) 40 MG capsule Take 40 mg by mouth daily.     [provider]  OXYGEN-HELIUM IN Inhale 2 L into the lungs at bedtime. 2 liters nightly    [provider]  simvastatin (ZOCOR) 10 MG tablet Take 10 mg by mouth daily.  11/30/14   [provider]  sitaGLIPtin (JANUVIA) 100 MG tablet Take 100 mg by mouth daily.  03/27/14   [provider]  thiamine 100 MG tablet Take 1 tablet (100 mg total) by mouth daily. 08/18/15   Hongalgi, Lenis Dickinson, MD  traMADol (ULTRAM) 50 MG tablet Take 1 tablet (50 mg total) by mouth every 6 (six) hours as needed. 05/06/18   Ward, Ozella Almond, PA-C  triamcinolone ointment (KENALOG) 0.1 %  05/21/18   [provider]    Physical Exam:  Constitutional: NAD, calm, comfortable Vitals:   09/21/18 1530 09/21/18 1600 09/21/18 1630 09/21/18 1730  BP: 138/82 (!) 159/92 (!) 158/96 (!) 141/78  Pulse: 96 95 99 95  Resp: (!) 9 19 (!) 23 (!) 24  Temp:      TempSrc:      SpO2: 99% 96% 96% 96%   Eyes: PERRL, lids and conjunctivae normal ENMT: Mucous membranes are  moist. Posterior pharynx clear of any exudate or lesions.Normal dentition.  Neck: normal, supple, no masses, no thyromegaly  Respiratory: clear to auscultation bilaterally, no wheezing, no crackles. Normal respiratory effort. No accessory muscle use.  Cardiovascular: Slightly tachycardic but regular rate and rhythm, no murmurs / rubs / gallops. No extremity edema. 2+ pedal pulses. No carotid bruits.  Abdomen: no tenderness, no masses palpated. No hepatosplenomegaly. Bowel sounds positive.  Musculoskeletal: no clubbing / cyanosis. No joint deformity upper and lower extremities. Good ROM, no contractures. Normal muscle tone.  Skin: no rashes, lesions, ulcers. No induration Neurologic: CN 2-12 grossly intact. Sensation intact, DTR normal. Strength 5/5 in all 4.  Psychiatric: Normal judgment and insight. Alert and oriented x 3. Normal mood.    Labs on Admission: I have personally reviewed following labs and imaging studies  CBC: Recent Labs  Lab 09/21/18 1509  WBC 15.4*  NEUTROABS 11.7*  HGB 8.4*  HCT 28.5*  MCV 86.9  PLT 297*   Basic Metabolic Panel: Recent Labs  Lab 09/21/18 1509  NA 137  K 5.3*  CL 104  CO2 24  GLUCOSE 202*  BUN 29*  CREATININE 1.79*  CALCIUM 8.8*   GFR: CrCl cannot be calculated (Unknown ideal weight.). Liver Function Tests: Recent Labs  Lab 09/21/18 1509  AST 33  ALT 11  ALKPHOS 214*  BILITOT 0.5  PROT 6.0*  ALBUMIN 2.1*   No results for input(s): LIPASE, AMYLASE in the last 168 hours. No results for input(s): AMMONIA in the last 168 hours. Coagulation Profile: Recent Labs  Lab 09/21/18 1534  INR 1.05   Cardiac Enzymes: No results for input(s): CKTOTAL, CKMB, CKMBINDEX, TROPONINI in the last 168 hours. BNP (last 3 results) No results for input(s): PROBNP in the last 8760 hours. HbA1C: No results for input(s): HGBA1C in the last 72 hours. CBG: No results for input(s): GLUCAP in the last 168 hours. Lipid Profile: No results for  input(s): CHOL, HDL, LDLCALC, TRIG, CHOLHDL, LDLDIRECT in the last 72 hours. Thyroid Function Tests: No results for input(s): TSH, T4TOTAL, FREET4, T3FREE, THYROIDAB in the last 72 hours. Anemia Panel: No results for input(s): VITAMINB12, FOLATE, FERRITIN, TIBC, IRON, RETICCTPCT in the last 72 hours. Urine analysis:    Component Value Date/Time   COLORURINE STRAW (A) 08/28/2018 1700   APPEARANCEUR CLEAR 08/28/2018 1700   LABSPEC 1.007 08/28/2018 1700   PHURINE 6.0 08/28/2018 1700   GLUCOSEU >=500 (A) 08/28/2018 1700   HGBUR MODERATE (A) 08/28/2018 1700   BILIRUBINUR NEGATIVE 08/28/2018 1700   KETONESUR NEGATIVE 08/28/2018 1700   PROTEINUR 100 (A) 08/28/2018 1700   UROBILINOGEN 0.2 05/09/2015 2109   NITRITE NEGATIVE 08/28/2018 1700   LEUKOCYTESUR NEGATIVE 08/28/2018 1700    Radiological Exams on Admission: No results found.  EKG: Independently reviewed.  Sinus rhythm when compared with prior there is no change  Assessment/Plan Active Problems:   Anemia   Bright red blood per rectum   1.  Bright red blood per rectum: Suspect hemorrhoidal bleeding versus diverticular bleeding.  On her colonoscopy on January 30 the patient did have some diverticulosis she also found some hemorrhoids on perianal exam and internal hemorrhoids which were nonbleeding.  Currently patient is without pain.  Will check CBC every 6 hours and transfuse if hemoglobin less than 7 and patient symptomatic.  GI has been consulted for further management options.  2.  Anemia: We will monitor patient will likely benefit from treatment as an outpatient with iron.  DVT prophylaxis: SCDs Code Status: Full code Family Communication: No family present at the time of admission.  Patient retains capacity Disposition Plan: Likely home  after monitoring of hemoglobin Consults called: [facility] per ED Admission status: Observation   Lady Deutscher MD [facility] Pager [phone]  If 7PM-7AM, please  contact night-coverage [email or web address] Password [facility]  09/21/2018, 6:06 PM

## 2018-09-21 NOTE — Telephone Encounter (Signed)
Please advise if you have seen this lab work for [employer]  Thank you!

## 2018-09-21 NOTE — ED Notes (Signed)
Dr. Evangeline Gula paged to [place], PA-paged by Levada Dy

## 2018-09-21 NOTE — Telephone Encounter (Signed)
Follow up   Per Amy has lab results, she wants to make sure that you review this information. She states that she faxed this information on yesterday. Please advise if you don't receive this information.

## 2018-09-21 NOTE — ED Provider Notes (Signed)
[facility] EMERGENCY DEPARTMENT Provider Note   CSN: [id number] Arrival date & time: 09/21/18  1401     History   Chief Complaint No chief complaint on file.   HPI [name] is a 73 y.o. female.  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  Patient reports that she had multiple bloody stools this morning.  She reports her stools have been loose.  Patient did not notice this yesterday.  Patient reports that these were painless and she did not have any abdominal cramping.  She denies any hematemesis.  Patient reports that she was admitted to the hospital 3 weeks ago GI bleeding.  She had colonoscopy which demonstrated internal and external hemorrhoids, no other active bleeding, and normal EGD.  Patient did not have varices.  Patient denies any alcohol use currently.  She reports aspirin use but no other blood thinner use.  Patient denies any chest pain, shortness of breath, dizziness, lightheadedness, syncope or presyncope.  Past Medical History:  Diagnosis Date  . Acute venous embolism and thrombosis of deep vessels of proximal lower extremity ([place])   . Alcoholic cirrhosis ([place])   . Anemia   . Anxiety   . Arthritis   . Asthma   . Back pain   . Blood transfusion few years ago  . Cellulitis    bilateral lower extremities  . CHF (congestive heart failure) ([place]) 10/13   grade 1 diastolic dysfunction, Nl LVF  . Chronic diastolic congestive heart failure ([place])   . Chronic obstructive pulmonary disease (COPD) ([place])   . Colon polyps   . Depression   . Diabetes mellitus    insulin dep   . Gastroesophageal reflux   . GI bleeding 08/28/2018  . Glaucoma    left  . Gout   . Hepatitis    alcoholic hepatitis  . History of GI diverticular bleed   . Hypertension   . Leg swelling   . On home  oxygen therapy    "2L just at night" (08/28/2018)  . Pneumonia    history of  . PVD (peripheral vascular disease) ([place]) 2009   bilat iliac stenting  . Reflux esophagitis   . Villous adenoma of colon 05/10/11    Patient Active Problem List   Diagnosis Date Noted  . AKI (acute kidney injury) ([place]) 08/28/2018  . Gout 08/10/2015  . Hypoglycemia   . Hypothermia   . UTI (lower urinary tract infection)   . Humeral head fracture 06/05/2015  . Idiopathic chronic gout of multiple sites without tophus 06/05/2015  . Chronic renal disease, stage III ([place]) 06/05/2015  . Nausea vomiting and diarrhea 05/09/2015  . Increased urinary frequency 05/09/2015  . Sepsis ([place]) 05/09/2015  . Hypokalemia   . Chronic diastolic congestive heart failure ([place])   . Hyperlipidemia 02/10/2015  . Cellulitis 11/04/2014  . Essential hypertension 08/27/2014  . Abdominal pain, acute, bilateral lower quadrant 07/26/2014  . Colitis 07/26/2014  . GI bleeding 06/14/2014  . GI bleed 06/14/2014  . Acute encephalopathy 06/14/2014  . Personal history of colonic polyps 08/06/2013  . Knee pain, acute 05/20/2013  . Claudication ([place]) 03/13/2013  . Obesity 03/13/2013  . PVD, LEIA/LIIA and RCIA pta 7/09- ABIs 0.68 and 0.69 Feb 2014 11/17/2012  . PUD (peptic ulcer disease) GI bleed in past (2011) 11/17/2012  . Cirrhosis of liver ([place])  11/17/2012  . Colon cancer, lap chole 2012 11/17/2012  . 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 Atypical chest pain- no history of CAD. Myoview low risk 2012 11/15/2012  . Controlled diabetes mellitus type 2 with complications ([place]) 96/11/5407  . Acute renal failure superimposed on stage 3 chronic kidney disease ([place]) 06/16/2012  . Incisional hernia 01/09/2012  . Anemia 01/28/2011  . COPD (chronic obstructive pulmonary disease) ([place]) 01/28/2011  . Sleep apnea 01/28/2011  . Transfusion history 01/28/2011  . Sinus problem 01/28/2011  . Full dentures 01/28/2011     Past Surgical History:  Procedure Laterality Date  . ABDOMINAL HYSTERECTOMY  yrs ago  . BIOPSY  08/31/2018   Procedure: BIOPSY;  Surgeon: [name], MD;  Location: [facility];  Service: Gastroenterology;;  . CATARACT EXTRACTION Right yrs ago  . COLONOSCOPY    . COLONOSCOPY WITH PROPOFOL N/A 08/06/2013   Procedure: COLONOSCOPY WITH PROPOFOL;  Surgeon: Lear Ng, MD;  Location: [facility];  Service: Endoscopy;  Laterality: N/A;  . COLONOSCOPY WITH PROPOFOL N/A 08/31/2018   Procedure: COLONOSCOPY WITH PROPOFOL;  Surgeon: [name], MD;  Location: [facility];  Service: Gastroenterology;  Laterality: N/A;  . ESOPHAGOGASTRODUODENOSCOPY (EGD) WITH PROPOFOL N/A 08/06/2013   Procedure: ESOPHAGOGASTRODUODENOSCOPY (EGD) WITH PROPOFOL;  Surgeon: Lear Ng, MD;  Location: [facility];  Service: Endoscopy;  Laterality: N/A;  . ESOPHAGOGASTRODUODENOSCOPY (EGD) WITH PROPOFOL N/A 08/30/2018   Procedure: ESOPHAGOGASTRODUODENOSCOPY (EGD) WITH PROPOFOL;  Surgeon: [name], MD;  Location: [facility];  Service: Gastroenterology;  Laterality: N/A;  . LAPAROSCOPIC ASSISSTED TOTAL COLECTOMY W/ J-POUCH  04/22/11  . VENTRAL HERNIA REPAIR  06/13/2012   Procedure: LAPAROSCOPIC VENTRAL HERNIA;  Surgeon: Adin Hector, MD;  Location: [facility];  Service: General;  Laterality: N/A;  Laparoscopic Assisted Ventral Hernia with Mesh     OB History   No obstetric history on file.      Home Medications    Prior to Admission medications   Medication Sig Start Date End Date Taking? Authorizing Provider  acetaminophen (TYLENOL) 325 MG tablet Take 2 tablets (650 mg total) by mouth every 6 (six) hours as needed for mild pain, fever or headache (or Fever >/= 101). 09/01/18   Roxan Hockey, MD  albuterol (PROVENTIL HFA;VENTOLIN HFA) 108 (90 BASE) MCG/ACT inhaler Inhale 2 puffs into the lungs every 6 (six) hours as needed. For shortness of breath    [provider]  albuterol (PROVENTIL)  (2.5 MG/3ML) 0.083% nebulizer solution Take 2.5 mg by nebulization every 6 (six) hours as needed for wheezing or shortness of breath.    [provider]  allopurinol (ZYLOPRIM) 100 MG tablet Take 100 mg by mouth daily.  01/18/16   [provider]  aspirin 81 MG EC tablet Take 1 tablet (81 mg total) by mouth daily with breakfast. 09/01/18   Roxan Hockey, MD  cilostazol (PLETAL) 50 MG tablet Take 50 mg by mouth 2 (two) times daily.  01/18/16   [provider]  diclofenac sodium (VOLTAREN) 1 % GEL Apply 2 g topically 4 (four) times daily.  05/21/18   [provider]  ergocalciferol (VITAMIN D2) 50000 UNITS capsule Take 50,000 Units by mouth every Monday. On Monday    [provider]  ferrous sulfate 325 (65 FE) MG tablet Take 325 mg by mouth daily with breakfast.    [provider]  fluticasone (FLONASE) 50 MCG/ACT nasal spray Place 2 sprays into the nose daily as needed for allergies.  [provider]  folic acid (FOLVITE) 1 MG tablet Take 1 tablet (1 mg total) by mouth daily. 08/18/15   Hongalgi, Lenis Dickinson, MD  furosemide (LASIX) 20 MG tablet Take 1 tablet (20 mg total) by mouth daily. 08/18/15   Hongalgi, Lenis Dickinson, MD  glimepiride (AMARYL) 4 MG tablet Take 1 tablet (4 mg total) by mouth daily with breakfast. 09/01/18   Roxan Hockey, MD  HYDROcodone-acetaminophen (NORCO/VICODIN) 5-325 MG tablet Take 1-2 tablets by mouth every 6 (six) hours as needed for severe pain. Patient not taking: Reported on 08/20/2018 01/24/17   Little, Wenda Overland, MD  LANTUS SOLOSTAR 100 UNIT/ML Solostar Pen Inject 26 Units into the skin every morning. 09/01/18   Roxan Hockey, MD  lisinopril (PRINIVIL,ZESTRIL) 5 MG tablet Take 5 mg by mouth daily. 01/18/16   [provider]  magnesium oxide (MAG-OX) 400 MG tablet Take 400 mg by mouth daily.    [provider]  methocarbamol (ROBAXIN) 500 MG tablet Take 1 tablet (500 mg total) by mouth every 8  (eight) hours as needed for muscle spasms. 02/17/16   Mesner, Corene Cornea, MD  metoprolol tartrate (LOPRESSOR) 25 MG tablet Take 1 tablet (25 mg total) by mouth 2 (two) times daily. 09/01/18   Roxan Hockey, MD  montelukast (SINGULAIR) 10 MG tablet Take 10 mg by mouth at bedtime.     [provider]  omeprazole (PRILOSEC) 40 MG capsule Take 40 mg by mouth daily.     [provider]  OXYGEN-HELIUM IN Inhale 2 L into the lungs at bedtime. 2 liters nightly    [provider]  simvastatin (ZOCOR) 10 MG tablet Take 10 mg by mouth daily.  11/30/14   [provider]  sitaGLIPtin (JANUVIA) 100 MG tablet Take 100 mg by mouth daily.  03/27/14   [provider]  thiamine 100 MG tablet Take 1 tablet (100 mg total) by mouth daily. 08/18/15   Hongalgi, Lenis Dickinson, MD  traMADol (ULTRAM) 50 MG tablet Take 1 tablet (50 mg total) by mouth every 6 (six) hours as needed. 05/06/18   Ward, Ozella Almond, PA-C  triamcinolone ointment (KENALOG) 0.1 %  05/21/18   [provider]    Family History Family History  Problem Relation Age of Onset  . Cancer Brother        heent ca  . Cancer Sister        liver ca    Social History Social History   Tobacco Use  . Smoking status: Current Some Day Smoker    Packs/day: 0.50    Years: 30.00    Pack years: 15.00    Types: Cigarettes    Last attempt to quit: 11/06/2005    Years since quitting: 12.8  . Smokeless tobacco: Never Used  Substance Use Topics  . Alcohol use: No    Comment: stopped 2005  . Drug use: No     Allergies   Compazine [prochlorperazine edisylate]; Penicillins; Phenergan [promethazine hcl]; and Lipitor [atorvastatin]   Review of Systems Review of Systems  Constitutional: Negative for chills and fever.  HENT: Negative for congestion and sore throat.   Eyes: Negative for visual disturbance.  Respiratory: Negative for cough, chest tightness and shortness of breath.   Cardiovascular: Negative for chest  pain, palpitations and leg swelling.  Gastrointestinal: Positive for blood in stool. Negative for abdominal pain, diarrhea, nausea and vomiting.  Genitourinary: Negative for dysuria and flank pain.  Musculoskeletal: Negative for back pain and myalgias.  Skin: Negative for  rash.  Neurological: Negative for dizziness, syncope, light-headedness and headaches.     Physical Exam Updated Vital Signs BP 138/82   Pulse 96   Temp 98.3 F (36.8 C) (Oral)   Resp (!) 9   SpO2 99%   Physical Exam Vitals signs and nursing note reviewed.  Constitutional:      General: She is not in acute distress.    Appearance: Normal appearance. She is well-developed.  HENT:     Head: Normocephalic and atraumatic.  Eyes:     Conjunctiva/sclera: Conjunctivae normal.     Pupils: Pupils are equal, round, and reactive to light.  Neck:     Musculoskeletal: Normal range of motion and neck supple.  Cardiovascular:     Rate and Rhythm: Normal rate and regular rhythm.     Heart sounds: S1 normal and S2 normal. No murmur.  Pulmonary:     Effort: Pulmonary effort is normal.     Breath sounds: Normal breath sounds. No wheezing or rales.  Abdominal:     General: There is no distension.     Palpations: Abdomen is soft.     Tenderness: There is no abdominal tenderness. There is no guarding.  Genitourinary:    Comments: Rectal examination performed with nurse tech chaperone present.  Patient has prolapsed internal hemorrhoid.  Non-thrombosed external hemorrhoid.  Rectal exam reveals brown stool mixed with bright red blood in the rectal vault.  Clots of blood in rectal vault present. Musculoskeletal: Normal range of motion.        General: No deformity.  Lymphadenopathy:     Cervical: No cervical adenopathy.  Skin:    General: Skin is warm and dry.     Findings: No erythema or rash.  Neurological:     Mental Status: She is alert.     Comments: Cranial nerves grossly intact. Patient moves extremities  symmetrically and with good coordination.  Psychiatric:        Behavior: Behavior normal.        Thought Content: Thought content normal.        Judgment: Judgment normal.      ED Treatments / Results  Labs (all labs ordered are listed, but only abnormal results are displayed) Labs Reviewed  CBC WITH DIFFERENTIAL/PLATELET - Abnormal; Notable for the following components:      Result Value   WBC 15.4 (*)    RBC 3.28 (*)    Hemoglobin 8.4 (*)    HCT 28.5 (*)    MCH 25.6 (*)    MCHC 29.5 (*)    Platelets 421 (*)    Neutro Abs 11.7 (*)    All other components within normal limits  COMPREHENSIVE METABOLIC PANEL  PROTIME-INR  POC OCCULT BLOOD, ED  TYPE AND SCREEN  TYPE AND SCREEN    EKG EKG Interpretation  Date/Time:  Friday September 21 2018 14:30:45 EST Ventricular Rate:  91 PR Interval:    QRS Duration: 77 QT Interval:  346 QTC Calculation: 426 R Axis:   54 Text Interpretation:  Sinus rhythm No significant change since last tracing Confirmed by Lajean Saver [phone]) on 09/21/2018 2:40:33 PM   Radiology No results found.  Procedures Procedures (including critical care time)  Medications Ordered in ED Medications  sodium chloride 0.9 % bolus 500 mL (500 mLs Intravenous New Bag/Given 09/21/18 1548)  pantoprazole (PROTONIX) injection 40 mg (40 mg Intravenous Given 09/21/18 1549)     Initial Impression / Assessment and Plan / ED Course  I have reviewed  the triage vital signs and the nursing notes.  Pertinent labs & imaging results that were available during my care of the patient were reviewed by me and considered in my medical decision making (see chart for details).  Clinical Course as of Sep 21 1640  Fri Sep 21, 2018  1526 Patient has chronic leukocytosis.   WBC(!): 15.4 [AM]  1542 Pt borderline tachycardic. Will give small amount of fluid.   Pulse Rate: 96 [AM]  1551 Slight AKI. Will administer soft fluids.   Creatinine(!): 1.79 [AM]  1555 No EKG changes.  Pt getting 500 ml NS.   Potassium(!): 5.3 [AM]  1607 207 on last ED visit 3-4 weeks ago.   Alkaline Phosphatase(!): 214 [AM]  6063 Spoke with Dr. Evangeline Gula of [facility] who will admit patient. I appreciate her involvement in the care of this patient. Recommends GI consultation to involve in care. Page placed.    [AM]    Clinical Course User Index [AM] Albesa Seen, PA-C    Patient is nontoxic-appearing, hemodynamically stable, and in no acute distress.  Patient with 2-3 episodes of bright red blood per rectum this morning.  Suspect hemorrhoidal bleeding based on recent colonoscopy 3 weeks ago and normal EGD.  Do not suspect upper GI bleed.  Patient's hemoglobin is slightly below prior.  It is 8.4 today.  She required transfusion the last time she had GI bleeding.  Suspect that she will need serial H&H.  Patient does have leukocytosis, however consistently has leukocytosis on prior presentations.  Patient is not having any abdominal pain, fevers, other symptoms suggestive of acute infectious process.  Do not suspect sepsis.  Hemoglobin  Date Value Ref Range Status  09/21/2018 8.4 (L) 12.0 - 15.0 g/dL Final  09/01/2018 9.9 (L) 12.0 - 15.0 g/dL Final  08/31/2018 12.2 12.0 - 15.0 g/dL Final  08/30/2018 10.4 (L) 12.0 - 15.0 g/dL Final   Patient given Protonix, however doubt upper GI bleed.  Will admit to hospital medicine.  This is a shared visit with Dr. Lajean Saver. Patient was independently evaluated by this attending physician. Attending physician consulted in evaluation and admission management.  Final Clinical Impressions(s) / ED Diagnoses   Final diagnoses:  Gastrointestinal hemorrhage, unspecified gastrointestinal hemorrhage type  Leukocytosis, unspecified type  AKI (acute kidney injury) [facility])    ED Discharge Orders    None       Tamala Julian 09/21/18 1642    Lajean Saver, MD 09/22/18 [phone]

## 2018-09-22 ENCOUNTER — Other Ambulatory Visit: Payer: Self-pay

## 2018-09-22 DIAGNOSIS — D649 Anemia, unspecified: Secondary | ICD-10-CM | POA: Diagnosis not present

## 2018-09-22 DIAGNOSIS — K648 Other hemorrhoids: Secondary | ICD-10-CM | POA: Diagnosis not present

## 2018-09-22 DIAGNOSIS — K625 Hemorrhage of anus and rectum: Secondary | ICD-10-CM | POA: Diagnosis not present

## 2018-09-22 LAB — BASIC METABOLIC PANEL
Anion gap: 5 (ref 5–15)
BUN: 25 mg/dL — ABNORMAL HIGH (ref 8–23)
CO2: 26 mmol/L (ref 22–32)
Calcium: 8.1 mg/dL — ABNORMAL LOW (ref 8.9–10.3)
Chloride: 107 mmol/L (ref 98–111)
Creatinine, Ser: 1.53 mg/dL — ABNORMAL HIGH (ref 0.44–1.00)
GFR calc Af Amer: 38 mL/min — ABNORMAL LOW (ref >60)
GFR calc non Af Amer: 33 mL/min — ABNORMAL LOW (ref >60)
Glucose, Bld: 161 mg/dL — ABNORMAL HIGH (ref 70–99)
Potassium: 4.8 mmol/L (ref 3.5–5.1)
Sodium: 138 mmol/L (ref 135–145)

## 2018-09-22 LAB — CBC
HCT: 23.2 % — ABNORMAL LOW (ref 36.0–46.0)
HCT: 24 % — ABNORMAL LOW (ref 36.0–46.0)
HCT: 26.2 % — ABNORMAL LOW (ref 36.0–46.0)
HCT: 29.4 % — ABNORMAL LOW (ref 36.0–46.0)
Hemoglobin: 7.1 g/dL — ABNORMAL LOW (ref 12.0–15.0)
Hemoglobin: 7.1 g/dL — ABNORMAL LOW (ref 12.0–15.0)
Hemoglobin: 7.7 g/dL — ABNORMAL LOW (ref 12.0–15.0)
Hemoglobin: 8.5 g/dL — ABNORMAL LOW (ref 12.0–15.0)
MCH: 25 pg — ABNORMAL LOW (ref 26.0–34.0)
MCH: 25.4 pg — ABNORMAL LOW (ref 26.0–34.0)
MCH: 25.7 pg — ABNORMAL LOW (ref 26.0–34.0)
MCH: 26.3 pg (ref 26.0–34.0)
MCHC: 28.9 g/dL — ABNORMAL LOW (ref 30.0–36.0)
MCHC: 29.4 g/dL — ABNORMAL LOW (ref 30.0–36.0)
MCHC: 29.6 g/dL — ABNORMAL LOW (ref 30.0–36.0)
MCHC: 30.6 g/dL (ref 30.0–36.0)
MCV: 85.9 fL (ref 80.0–100.0)
MCV: 86 fL (ref 80.0–100.0)
MCV: 86.5 fL (ref 80.0–100.0)
MCV: 87.3 fL (ref 80.0–100.0)
PLATELETS: 334 10*3/uL (ref 150–400)
PLATELETS: 335 10*3/uL (ref 150–400)
PLATELETS: 364 10*3/uL (ref 150–400)
PLATELETS: 392 10*3/uL (ref 150–400)
RBC: 2.7 MIL/uL — ABNORMAL LOW (ref 3.87–5.11)
RBC: 2.79 MIL/uL — ABNORMAL LOW (ref 3.87–5.11)
RBC: 3 MIL/uL — ABNORMAL LOW (ref 3.87–5.11)
RBC: 3.4 MIL/uL — ABNORMAL LOW (ref 3.87–5.11)
RDW: 15.5 % (ref 11.5–15.5)
RDW: 15.5 % (ref 11.5–15.5)
RDW: 15.6 % — ABNORMAL HIGH (ref 11.5–15.5)
RDW: 15.7 % — ABNORMAL HIGH (ref 11.5–15.5)
WBC: 13.5 10*3/uL — AB (ref 4.0–10.5)
WBC: 13.6 10*3/uL — ABNORMAL HIGH (ref 4.0–10.5)
WBC: 13.9 10*3/uL — ABNORMAL HIGH (ref 4.0–10.5)
WBC: 16.6 10*3/uL — ABNORMAL HIGH (ref 4.0–10.5)
nRBC: 0 % (ref 0.0–0.2)
nRBC: 0 % (ref 0.0–0.2)
nRBC: 0 % (ref 0.0–0.2)
nRBC: 0 % (ref 0.0–0.2)

## 2018-09-22 LAB — IRON AND TIBC
Iron: 26 ug/dL — ABNORMAL LOW (ref 28–170)
SATURATION RATIOS: 23 % (ref 10.4–31.8)
TIBC: 115 ug/dL — ABNORMAL LOW (ref 250–450)
UIBC: 89 ug/dL

## 2018-09-22 LAB — GLUCOSE, CAPILLARY
Glucose-Capillary: 142 mg/dL — ABNORMAL HIGH (ref 70–99)
Glucose-Capillary: 207 mg/dL — ABNORMAL HIGH (ref 70–99)
Glucose-Capillary: 131 mg/dL — ABNORMAL HIGH (ref 70–99)

## 2018-09-22 LAB — FERRITIN: Ferritin: 160 ng/mL (ref 11–307)

## 2018-09-22 LAB — GAMMA GT: GGT: 68 U/L — ABNORMAL HIGH (ref 7–50)

## 2018-09-22 MED ORDER — DICLOFENAC SODIUM 1 % TD GEL
2.0000 g | Freq: Four times a day (QID) | TRANSDERMAL | Status: DC
  Administered 2018-09-22 – 2018-09-26 (×13): 2 g via TOPICAL
  Filled 2018-09-22: qty 100

## 2018-09-22 MED ORDER — ALBUTEROL SULFATE (2.5 MG/3ML) 0.083% IN NEBU: 2.5000 mg | INHALATION_SOLUTION | Freq: Four times a day (QID) | RESPIRATORY_TRACT | Status: DC | PRN

## 2018-09-22 MED ORDER — FLUTICASONE PROPIONATE 50 MCG/ACT NA SUSP
2.0000 | Freq: Every day | NASAL | Status: DC | PRN
  Filled 2018-09-22: qty 16

## 2018-09-22 MED ORDER — METHOCARBAMOL 500 MG PO TABS
500.0000 mg | ORAL_TABLET | Freq: Three times a day (TID) | ORAL | Status: DC | PRN
  Administered 2018-09-22 – 2018-09-26 (×2): 500 mg via ORAL
  Filled 2018-09-22 (×2): qty 1

## 2018-09-22 MED ORDER — INSULIN ASPART 100 UNIT/ML ~~LOC~~ SOLN
0.0000 [IU] | Freq: Three times a day (TID) | SUBCUTANEOUS | Status: DC
  Administered 2018-09-22: 3 [IU] via SUBCUTANEOUS
  Administered 2018-09-23: 1 [IU] via SUBCUTANEOUS
  Administered 2018-09-23 – 2018-09-24 (×2): 2 [IU] via SUBCUTANEOUS
  Administered 2018-09-24 – 2018-09-25 (×2): 1 [IU] via SUBCUTANEOUS
  Administered 2018-09-25 – 2018-09-26 (×3): 2 [IU] via SUBCUTANEOUS

## 2018-09-22 MED ORDER — METOPROLOL TARTRATE 25 MG PO TABS
25.0000 mg | ORAL_TABLET | Freq: Two times a day (BID) | ORAL | Status: DC
  Administered 2018-09-22 – 2018-09-23 (×2): 25 mg via ORAL
  Filled 2018-09-22 (×2): qty 1

## 2018-09-22 MED ORDER — INSULIN ASPART 100 UNIT/ML ~~LOC~~ SOLN
0.0000 [IU] | Freq: Every day | SUBCUTANEOUS | Status: DC
  Administered 2018-09-24: 2 [IU] via SUBCUTANEOUS

## 2018-09-22 MED ORDER — HYDROCORTISONE ACETATE 25 MG RE SUPP
25.0000 mg | Freq: Two times a day (BID) | RECTAL | Status: DC
  Administered 2018-09-23 – 2018-09-25 (×3): 25 mg via RECTAL
  Filled 2018-09-22 (×5): qty 1

## 2018-09-22 MED ORDER — MAGNESIUM OXIDE 400 (241.3 MG) MG PO TABS
400.0000 mg | ORAL_TABLET | Freq: Every day | ORAL | Status: DC
  Administered 2018-09-22 – 2018-09-26 (×5): 400 mg via ORAL
  Filled 2018-09-22 (×5): qty 1

## 2018-09-22 MED ORDER — VITAMIN B-1 100 MG PO TABS
100.0000 mg | ORAL_TABLET | Freq: Every day | ORAL | Status: DC
  Administered 2018-09-22 – 2018-09-26 (×5): 100 mg via ORAL
  Filled 2018-09-22 (×5): qty 1

## 2018-09-22 MED ORDER — FOLIC ACID 1 MG PO TABS
1.0000 mg | ORAL_TABLET | Freq: Every day | ORAL | Status: DC
  Administered 2018-09-22 – 2018-09-26 (×5): 1 mg via ORAL
  Filled 2018-09-22 (×5): qty 1

## 2018-09-22 NOTE — Progress Notes (Signed)
Progress Note    [name]  ZTI:[id number] DOB: 1945/10/28  DOA: 09/21/2018 PCP: Nolene Ebbs, MD    Brief Narrative:     Medical records reviewed and are as summarized below:  [name] is an 73 y.o. 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  She has had multiple bloody stools this morning.   Assessment/Plan:   Active Problems:   Anemia   Bright red blood per rectum  Bright red blood per rectum:  -Suspect hemorrhoidal bleeding versus diverticular bleeding.   -GI consult -anusol suppository BID  Anemia:  -Fe low -patient is not symptomatic so will transfuse for <7 -check labs in AM  hypodensity within the medial right hepatic lobe in an Alcoholic liver disease/Cirrhosis patient -Consider MRI abdomen tomorrow if kidney function continues to improve. -Check hepatitis panel, AMA and ASMA  HTN -resume home medications -list had not yet been reconsiled  CKD III -baseline CR 1.5  DM -SSI -carb mod diet -hold home meds  Family Communication/Anticipated D/C date and plan/Code Status   DVT prophylaxis: scd Code Status: Full Code.  Family Communication:  Disposition Plan: per GI   Medical Consultants:    GI     Subjective:   No chest pain/SOB  Objective:    Vitals:   09/21/18 1925 09/21/18 2357 09/22/18 0445 09/22/18 1214  BP: (!) 176/82 (!) 184/83 139/68 (!) 188/95  Pulse: 85 84 88 (!) 116  Resp: 20 19  16   Temp: 98.1 F (36.7 C) 98.9 F (37.2 C) 98.9 F (37.2 C) 99.8 F (37.7 C)  TempSrc: Oral Oral Oral Oral  SpO2: 98% 100% 100% 97%  Weight: 54 kg     Height: 5' (1.524 m)       Intake/Output Summary (Last 24 hours) at 09/22/2018 1544 Last data filed at 09/22/2018  1202 Gross per 24 hour  Intake 2143.33 ml  Output -  Net 2143.33 ml   Filed Weights   09/21/18 1925  Weight: 54 kg    Exam: Sitting on side of bed, eating rrr +BS, soft, NT Alert but ? Understanding- slow to respond at times No increased work of breathing  Data Reviewed:   I have personally reviewed following labs and imaging studies:  Labs: Labs show the following:   Basic Metabolic Panel: Recent Labs  Lab 09/21/18 1509 09/22/18 0613  NA 137 138  K 5.3* 4.8  CL 104 107  CO2 24 26  GLUCOSE 202* 161*  BUN 29* 25*  CREATININE 1.79* 1.53*  CALCIUM 8.8* 8.1*   GFR Estimated Creatinine Clearance: 23.2 mL/min (A) (by C-G formula based on SCr of 1.53 mg/dL (H)). Liver Function Tests: Recent Labs  Lab 09/21/18 1509  AST 33  ALT 11  ALKPHOS 214*  BILITOT 0.5  PROT 6.0*  ALBUMIN 2.1*   No results for input(s): LIPASE, AMYLASE in the last 168 hours. No results for input(s): AMMONIA in the last 168 hours. Coagulation profile Recent Labs  Lab 09/21/18 1534  INR 1.05    CBC: Recent Labs  Lab 09/21/18 1509 09/21/18 2040 09/22/18 0016 09/22/18 0613 09/22/18 1111  WBC 15.4* 13.8* 16.6* 13.6* 13.5*  NEUTROABS 11.7*  --   --   --   --   HGB 8.4* 8.1*  8.5* 7.1* 7.1*  HCT 28.5* 26.4* 29.4* 24.0* 23.2*  MCV 86.9 85.4 86.5 86.0 85.9  PLT 421* 415* 392 334 335   Cardiac Enzymes: No results for input(s): CKTOTAL, CKMB, CKMBINDEX, TROPONINI in the last 168 hours. BNP (last 3 results) No results for input(s): PROBNP in the last 8760 hours. CBG: Recent Labs  Lab 09/21/18 1859 09/21/18 2134 09/22/18 0653  GLUCAP 142* 226* 142*   D-Dimer: No results for input(s): DDIMER in the last 72 hours. Hgb A1c: No results for input(s): HGBA1C in the last 72 hours. Lipid Profile: No results for input(s): CHOL, HDL, LDLCALC, TRIG, CHOLHDL, LDLDIRECT in the last 72 hours. Thyroid function studies: No results for input(s): TSH, T4TOTAL, T3FREE, THYROIDAB in the last  72 hours.  Invalid input(s): FREET3 Anemia work up: Recent Labs    09/22/18 1145  FERRITIN 160  TIBC 115*  IRON 26*   Sepsis Labs: Recent Labs  Lab 09/21/18 2040 09/22/18 0016 09/22/18 0613 09/22/18 1111  WBC 13.8* 16.6* 13.6* 13.5*    Microbiology No results found for this or any previous visit (from the past 240 hour(s)).  Procedures and diagnostic studies:  No results found.  Medications:   . diclofenac sodium  2 g Topical QID  . folic acid  1 mg Oral Daily  . hydrocortisone  25 mg Rectal BID  . magnesium oxide  400 mg Oral Daily  . metoprolol tartrate  25 mg Oral BID  . sodium chloride flush  3 mL Intravenous Q12H  . thiamine  100 mg Oral Daily   Continuous Infusions: . sodium chloride    . sodium chloride 75 mL/hr at 09/22/18 1202     LOS: 0 days   Geradine Girt  [facility]   *Please refer to [email or web address], password [email or web address] to get updated schedule on who will round on this patient, as hospitalists switch teams weekly. If 7PM-7AM, please contact night-coverage at [email or web address], password [email or web address] for any overnight needs.  09/22/2018, 3:44 PM

## 2018-09-22 NOTE — Progress Notes (Signed)
Pt admitted to the unit from ED: pt alert and verbally responsive; pt oriented to the unit and room; fall/safety precaution and prevention education completed; VSS; skin dry and intact; non-pitting edema to BLE; bed alarm on; call light within reach. Will closely monitor pt. Delia Heady RN   09/21/18 1925  Vitals  Temp 98.1 F (36.7 C)  Temp Source Oral  BP (!) 176/82 (RN notify)  BP Location Right Arm  BP Method Automatic  Patient Position (if appropriate) Lying  Pulse Rate 85  Pulse Rate Source Dinamap  Resp 20  Oxygen Therapy  SpO2 98 %  O2 Device Room Air  Height and Weight  Height 5' (1.524 m)  Weight 54 kg  Type of Scale Used Bed  Type of Weight Actual  BSA (Calculated - sq m) 1.51 sq meters  BMI (Calculated) 23.25  Weight in (lb) to have BMI = 25 127.7  MEWS Score  MEWS RR 0  MEWS Pulse 0  MEWS Systolic 0  MEWS LOC 0  MEWS Temp 0  MEWS Score 0  MEWS Score Color Green

## 2018-09-22 NOTE — Plan of Care (Signed)
  Problem: Clinical Measurements: Goal: Will remain free from infection Outcome: Progressing   Problem: Clinical Measurements: Goal: Ability to maintain clinical measurements within normal limits will improve Outcome: Progressing   Problem: Clinical Measurements: Goal: Respiratory complications will improve Outcome: Progressing   Problem: Coping: Goal: Level of anxiety will decrease Outcome: Progressing

## 2018-09-22 NOTE — Progress Notes (Signed)
[facility] Progress Note  NILZA EAKER 73 y.o. Dec 01, 1945  CC: Bright red blood per rectum.   Subjective: This is a 73 year old patient who was recently discharged after being treated for GI bleed.  Please see previous GI consult for details.  Patient with personal history of right hemicolectomy for large tubulovillous adenoma in 2012.  EGD on August 31, 2027 by Dr. Therisa Doyne showed 5 cm hiatal hernia otherwise normal.  No evidence of active bleeding.  Colonoscopy on August 31, 2018 showed evidence of ileocolonic anastomosis in the ascending colon, diverticulosis and internal and external hemorrhoids.  There was 5 mm tubular adenoma in the transverse colon which was removed with biopsy forceps.   Patient seen and examined at bedside.  Resting comfortably in the bed.  Discussed with the nursing staff.  She had bowel movement with streaks of blood in it but there was no  frank rectal bleeding   Patient is denying any acute GI or cardiac issues.  Objective: Vital signs in last 24 hours: Vitals:   09/21/18 2357 09/22/18 0445  BP: (!) 184/83 139/68  Pulse: 84 88  Resp: 19   Temp: 98.9 F (37.2 C) 98.9 F (37.2 C)  SpO2: 100% 100%    Physical Exam:  General.  Resting comfortably in the bed. Abdomen.  Soft, nontender, nondistended, midline scar mark from previous surgery noted.  Bowel sounds present Lower extremity.  No edema  Lab Results: Recent Labs    09/21/18 1509 09/22/18 0613  NA 137 138  K 5.3* 4.8  CL 104 107  CO2 24 26  GLUCOSE 202* 161*  BUN 29* 25*  CREATININE 1.79* 1.53*  CALCIUM 8.8* 8.1*   Recent Labs    09/21/18 1509  AST 33  ALT 11  ALKPHOS 214*  BILITOT 0.5  PROT 6.0*  ALBUMIN 2.1*   Recent Labs    09/21/18 1509  09/22/18 0016 09/22/18 0613  WBC 15.4*   < > 16.6* 13.6*  NEUTROABS 11.7*  --   --   --   HGB 8.4*   < > 8.5* 7.1*  HCT 28.5*   < > 29.4* 24.0*  MCV 86.9   < > 86.5 86.0  PLT 421*   < > 392 334   < > = values in  this interval not displayed.   Recent Labs    09/21/18 1534  LABPROT 13.6  INR 1.05      Assessment/Plan: -Rectal bleeding.  Most likely from hemorrhoids. -Acute blood loss anemia. - personal history of right hemicolectomy for large tubulovillous adenoma in 2012.  Last colonoscopy earlier this month showed small adenoma in the transverse colon otherwise no evidence of active bleeding. -Mild elevated alkaline phosphatase.  CT abdomen pelvis with contrast in April 2019 showed vague hypodensity within the medial right hepatic lobe near the IVC measuring 2.5 cm, changes of chronic pancreatitis, indeterminant lesion within the mid left kidney as well as changes of right hemicolectomy.  MRI abdomen was recommended for follow-up   Recommendations -------------------------- -Start soft diet. -Anusol suppository twice a day. -Consider MRI abdomen tomorrow if kidney function continues to improve. - Monitor H and H. -Check hepatitis panel, AMA and ASMA. -GI will follow.   Otis Brace MD, [place] 09/22/2018, 8:34 AM  Contact #  [phone]

## 2018-09-23 ENCOUNTER — Observation Stay (HOSPITAL_COMMUNITY): Payer: Medicare Other

## 2018-09-23 DIAGNOSIS — D649 Anemia, unspecified: Secondary | ICD-10-CM | POA: Diagnosis not present

## 2018-09-23 DIAGNOSIS — K648 Other hemorrhoids: Secondary | ICD-10-CM | POA: Diagnosis not present

## 2018-09-23 DIAGNOSIS — K625 Hemorrhage of anus and rectum: Secondary | ICD-10-CM | POA: Diagnosis not present

## 2018-09-23 DIAGNOSIS — I10 Essential (primary) hypertension: Secondary | ICD-10-CM | POA: Diagnosis not present

## 2018-09-23 LAB — CBC WITH DIFFERENTIAL/PLATELET
Abs Immature Granulocytes: 0.09 10*3/uL — ABNORMAL HIGH (ref 0.00–0.07)
BASOS ABS: 0 10*3/uL (ref 0.0–0.1)
Basophils Relative: 0 %
Eosinophils Absolute: 0.1 10*3/uL (ref 0.0–0.5)
Eosinophils Relative: 1 %
HCT: 27.7 % — ABNORMAL LOW (ref 36.0–46.0)
Hemoglobin: 8.1 g/dL — ABNORMAL LOW (ref 12.0–15.0)
Immature Granulocytes: 1 %
Lymphocytes Relative: 12 %
Lymphs Abs: 1.8 10*3/uL (ref 0.7–4.0)
MCH: 25.3 pg — ABNORMAL LOW (ref 26.0–34.0)
MCHC: 29.2 g/dL — ABNORMAL LOW (ref 30.0–36.0)
MCV: 86.6 fL (ref 80.0–100.0)
Monocytes Absolute: 1.3 10*3/uL — ABNORMAL HIGH (ref 0.1–1.0)
Monocytes Relative: 9 %
Neutro Abs: 11.5 10*3/uL — ABNORMAL HIGH (ref 1.7–7.7)
Neutrophils Relative %: 77 %
Platelets: 403 10*3/uL — ABNORMAL HIGH (ref 150–400)
RBC: 3.2 MIL/uL — ABNORMAL LOW (ref 3.87–5.11)
RDW: 15.6 % — ABNORMAL HIGH (ref 11.5–15.5)
WBC: 14.7 10*3/uL — ABNORMAL HIGH (ref 4.0–10.5)
nRBC: 0 % (ref 0.0–0.2)

## 2018-09-23 LAB — COMPREHENSIVE METABOLIC PANEL
ALK PHOS: 183 U/L — AB (ref 38–126)
ALT: 7 U/L (ref 0–44)
AST: 35 U/L (ref 15–41)
Albumin: 1.5 g/dL — ABNORMAL LOW (ref 3.5–5.0)
Anion gap: 8 (ref 5–15)
BUN: 25 mg/dL — AB (ref 8–23)
CALCIUM: 8.1 mg/dL — AB (ref 8.9–10.3)
CO2: 21 mmol/L — ABNORMAL LOW (ref 22–32)
Chloride: 107 mmol/L (ref 98–111)
Creatinine, Ser: 1.53 mg/dL — ABNORMAL HIGH (ref 0.44–1.00)
GFR calc Af Amer: 38 mL/min — ABNORMAL LOW (ref >60)
GFR calc non Af Amer: 33 mL/min — ABNORMAL LOW (ref >60)
Glucose, Bld: 161 mg/dL — ABNORMAL HIGH (ref 70–99)
Potassium: 5.6 mmol/L — ABNORMAL HIGH (ref 3.5–5.1)
Sodium: 136 mmol/L (ref 135–145)
Total Bilirubin: 0.8 mg/dL (ref 0.3–1.2)
Total Protein: 4.8 g/dL — ABNORMAL LOW (ref 6.5–8.1)

## 2018-09-23 LAB — GLUCOSE, CAPILLARY
Glucose-Capillary: 145 mg/dL — ABNORMAL HIGH (ref 70–99)
Glucose-Capillary: 161 mg/dL — ABNORMAL HIGH (ref 70–99)
Glucose-Capillary: 84 mg/dL (ref 70–99)
Glucose-Capillary: 106 mg/dL — ABNORMAL HIGH (ref 70–99)

## 2018-09-23 LAB — HCV COMMENT:

## 2018-09-23 LAB — HEPATITIS C ANTIBODY (REFLEX): HCV Ab: <0.1 s/co ratio (ref 0.0–0.9)

## 2018-09-23 LAB — MITOCHONDRIAL ANTIBODIES: Mitochondrial M2 Ab, IgG: <20 Units (ref 0.0–20.0)

## 2018-09-23 LAB — ANTI-SMOOTH MUSCLE ANTIBODY, IGG: F-Actin IgG: 4 Units (ref 0–19)

## 2018-09-23 LAB — HEPATITIS B SURFACE ANTIGEN: Hepatitis B Surface Ag: NEGATIVE

## 2018-09-23 MED ORDER — LACTATED RINGERS IV BOLUS
500.0000 mL | Freq: Once | INTRAVENOUS | Status: AC
  Administered 2018-09-23: 500 mL via INTRAVENOUS

## 2018-09-23 MED ORDER — HYDRALAZINE HCL 25 MG PO TABS
25.0000 mg | ORAL_TABLET | Freq: Three times a day (TID) | ORAL | Status: DC
  Administered 2018-09-23 – 2018-09-24 (×3): 25 mg via ORAL
  Filled 2018-09-23 (×3): qty 1

## 2018-09-23 MED ORDER — ONDANSETRON HCL 4 MG/2ML IJ SOLN
4.0000 mg | Freq: Four times a day (QID) | INTRAMUSCULAR | Status: DC | PRN
  Administered 2018-09-25: 4 mg via INTRAVENOUS
  Filled 2018-09-23: qty 2

## 2018-09-23 MED ORDER — LORAZEPAM 2 MG/ML IJ SOLN
1.0000 mg | Freq: Once | INTRAMUSCULAR | Status: AC
  Administered 2018-09-23: 1 mg via INTRAVENOUS
  Filled 2018-09-23: qty 1

## 2018-09-23 MED ORDER — GADOBUTROL 1 MMOL/ML IV SOLN
5.0000 mL | Freq: Once | INTRAVENOUS | Status: AC | PRN
  Administered 2018-09-23: 5 mL via INTRAVENOUS

## 2018-09-23 MED ORDER — METOPROLOL TARTRATE 50 MG PO TABS
50.0000 mg | ORAL_TABLET | Freq: Two times a day (BID) | ORAL | Status: DC
  Administered 2018-09-24 – 2018-09-26 (×5): 50 mg via ORAL
  Filled 2018-09-23 (×5): qty 1

## 2018-09-23 NOTE — Progress Notes (Signed)
[facility] Progress Note  [name] 73 y.o. [birth year]  CC: Bright red blood per rectum.   Subjective:  No further bleeding episodes.  Eating breakfast comfortably.  Denies abdominal pain, nausea vomiting.  ROS : Negative for chest pain and shortness of breath.  Objective: Vital signs in last 24 hours: Vitals:   09/23/18 0013 09/23/18 0859  BP: (!) 148/83 (!) 193/95  Pulse: 78 97  Resp: 18   Temp: 98.8 F (37.1 C)   SpO2: 98%     Physical Exam:  General.  Resting comfortably in the bed. Abdomen.  Soft, nontender, nondistended, midline scar mark from previous surgery noted.  Bowel sounds present Lower extremity.  No edema  Lab Results: Recent Labs    09/22/18 0613 09/23/18 0619  NA 138 136  K 4.8 5.6*  CL 107 107  CO2 26 21*  GLUCOSE 161* 161*  BUN 25* 25*  CREATININE 1.53* 1.53*  CALCIUM 8.1* 8.1*   Recent Labs    09/21/18 1509 09/23/18 0619  AST 33 35  ALT 11 7  ALKPHOS 214* 183*  BILITOT 0.5 0.8  PROT 6.0* 4.8*  ALBUMIN 2.1* 1.5*   Recent Labs    09/21/18 1509  09/22/18 1111 09/22/18 1846  WBC 15.4*   < > 13.5* 13.9*  NEUTROABS 11.7*  --   --   --   HGB 8.4*   < > 7.1* 7.7*  HCT 28.5*   < > 23.2* 26.2*  MCV 86.9   < > 85.9 87.3  PLT 421*   < > 335 364   < > = values in this interval not displayed.   Recent Labs    09/21/18 1534  LABPROT 13.6  INR 1.05   EGD on August 31, 2027 by Dr. Therisa Doyne showed 5 cm hiatal hernia otherwise normal.  No evidence of active bleeding.  Colonoscopy on August 31, 2018 showed evidence of ileocolonic anastomosis in the ascending colon, diverticulosis and internal and external hemorrhoids.  There was 5 mm tubular adenoma in the transverse colon which was removed with biopsy forceps.   Assessment/Plan: -Rectal bleeding.  Most likely from hemorrhoids. -Acute blood loss anemia. - personal history of right hemicolectomy for large tubulovillous adenoma in 2012.  Last colonoscopy earlier this  month showed small adenoma in the transverse colon otherwise no evidence of active bleeding. -Mild elevated alkaline phosphatase.  CT abdomen pelvis with contrast in April 2019 showed vague hypodensity within the medial right hepatic lobe near the IVC measuring 2.5 cm, changes of chronic pancreatitis, indeterminant lesion within the mid left kidney as well as changes of right hemicolectomy.  MRI abdomen was recommended for follow-up   Recommendations -------------------------- -Discussed with the radiology team.  We will go ahead and get MRI abdomen with liver protocol for further evaluation of lesion around the right lobe of the liver as well as kidney lesions. -Continue  Anusol suppository twice a day.  Hemoglobin stable -Hepatitis panel negative, normal iron saturation and ferritin.  AMA and ASMA pending. -GI will follow.   Otis Brace MD, [place] 09/23/2018, 9:08 AM  Contact #  [phone]

## 2018-09-23 NOTE — Progress Notes (Signed)
Pt reported not feeling good and nauseous; pt lethargic but verbally responsive; pt assessed to be weak in appearance; vitals taken; NP on-call notified and new orders received. Will closely monitor. Delia Heady RN   09/23/18 2035  Vitals  Temp 97.8 F (36.6 C)  Temp Source Oral  BP (!) 68/37  MAP (mmHg) (!) 48  BP Location Right Arm  BP Method Automatic  Patient Position (if appropriate) Lying  Pulse Rate 70  Pulse Rate Source Dinamap  Resp 16  Oxygen Therapy  SpO2 97 %  O2 Device Room Air  MEWS Score  MEWS RR 0  MEWS Pulse 0  MEWS Systolic 3  MEWS LOC 0  MEWS Temp 0  MEWS Score 3  MEWS Score Color Yellow

## 2018-09-23 NOTE — Progress Notes (Signed)
Progress Note    [name]  OEH:[id number] DOB: [birth year]  DOA: 09/21/2018 PCP: Nolene Ebbs, MD    Brief Narrative:     Medical records reviewed and are as summarized below:  [name] is an 73 y.o. 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  She has had multiple bloody stools this morning.   Assessment/Plan:   Active Problems:   Anemia   Bright red blood per rectum  Bright red blood per rectum:  -Suspect hemorrhoidal bleeding versus diverticular bleeding.   -GI consult appreciated -bleeding has stopped -anusol suppository BID  Anemia:  -Fe low -patient is not symptomatic so will transfuse for <7 -trend labs -no further bleeding  hypodensity within the medial right hepatic lobe in an Alcoholic liver disease/Cirrhosis patient - MRI abdomen per GI -Check hepatitis panel, AMA and ASMA  HTN -resume home medications as needed as BP is running high   CKD III -baseline CR 1.5  Hyperkalemia -recheck in AM  DM -SSI -carb mod diet -hold home meds  Not sure what the baseline for this patient is, can not image her being able to function at home on own-- doubt she is taking her meds as prescribed-- will need further investigation/neurology referral for cognitive evaluation  Family Communication/Anticipated D/C date and plan/Code Status   DVT prophylaxis: scd Code Status: Full Code.  Family Communication: brother was at bedside but he is not involved so not able to provide information-- patient lives alone, does her own meds but has some "caregivers" Disposition Plan: per GI   Medical Consultants:    GI     Subjective:   sleepy  Objective:    Vitals:   09/22/18 1729 09/23/18 0013  09/23/18 0859 09/23/18 1300  BP: (!) 184/83 (!) 148/83 (!) 193/95 (!) 161/95  Pulse: 95 78 97 74  Resp: 18 18  18   Temp: 98.1 F (36.7 C) 98.8 F (37.1 C)  98 F (36.7 C)  TempSrc: Oral Oral  Oral  SpO2: 99% 98%  99%  Weight:      Height:        Intake/Output Summary (Last 24 hours) at 09/23/2018 1556 Last data filed at 09/23/2018 0813 Gross per 24 hour  Intake 1299.12 ml  Output -  Net 1299.12 ml   Filed Weights   09/21/18 1925  Weight: 54 kg    Exam: Got some ativan for MRI so is very sleepy No distress +BS, soft, NT   Data Reviewed:   I have personally reviewed following labs and imaging studies:  Labs: Labs show the following:   Basic Metabolic Panel: Recent Labs  Lab 09/21/18 1509 09/22/18 0613 09/23/18 0619  NA 137 138 136  K 5.3* 4.8 5.6*  CL 104 107 107  CO2 24 26 21*  GLUCOSE 202* 161* 161*  BUN 29* 25* 25*  CREATININE 1.79* 1.53* 1.53*  CALCIUM 8.8* 8.1* 8.1*   GFR Estimated Creatinine Clearance: 23.2 mL/min (A) (by C-G formula based on SCr of 1.53 mg/dL (H)). Liver Function Tests: Recent Labs  Lab 09/21/18 1509 09/23/18 0619  AST 33 35  ALT 11 7  ALKPHOS 214* 183*  BILITOT 0.5 0.8  PROT 6.0* 4.8*  ALBUMIN 2.1* 1.5*   No results for input(s): LIPASE,  AMYLASE in the last 168 hours. No results for input(s): AMMONIA in the last 168 hours. Coagulation profile Recent Labs  Lab 09/21/18 1534  INR 1.05    CBC: Recent Labs  Lab 09/21/18 1509 09/21/18 2040 09/22/18 0016 09/22/18 0613 09/22/18 1111 09/22/18 1846  WBC 15.4* 13.8* 16.6* 13.6* 13.5* 13.9*  NEUTROABS 11.7*  --   --   --   --   --   HGB 8.4* 8.1* 8.5* 7.1* 7.1* 7.7*  HCT 28.5* 26.4* 29.4* 24.0* 23.2* 26.2*  MCV 86.9 85.4 86.5 86.0 85.9 87.3  PLT 421* 415* 392 334 335 364   Cardiac Enzymes: No results for input(s): CKTOTAL, CKMB, CKMBINDEX, TROPONINI in the last 168 hours. BNP (last 3 results) No results for input(s): PROBNP in the last 8760 hours. CBG: Recent  Labs  Lab 09/22/18 0653 09/22/18 1729 09/22/18 2138 09/23/18 0644 09/23/18 1140  GLUCAP 142* 207* 131* 145* 161*   D-Dimer: No results for input(s): DDIMER in the last 72 hours. Hgb A1c: No results for input(s): HGBA1C in the last 72 hours. Lipid Profile: No results for input(s): CHOL, HDL, LDLCALC, TRIG, CHOLHDL, LDLDIRECT in the last 72 hours. Thyroid function studies: No results for input(s): TSH, T4TOTAL, T3FREE, THYROIDAB in the last 72 hours.  Invalid input(s): FREET3 Anemia work up: Recent Labs    09/22/18 1145  FERRITIN 160  TIBC 115*  IRON 26*   Sepsis Labs: Recent Labs  Lab 09/22/18 0016 09/22/18 0613 09/22/18 1111 09/22/18 1846  WBC 16.6* 13.6* 13.5* 13.9*    Microbiology No results found for this or any previous visit (from the past 240 hour(s)).  Procedures and diagnostic studies:  Mr Liver W Wo Contrast  Result Date: 09/23/2018 CLINICAL DATA:  Evaluate abdominal mass. EXAM: MRI ABDOMEN WITHOUT AND WITH CONTRAST TECHNIQUE: Multiplanar multisequence MR imaging of the abdomen was performed both before and after the administration of intravenous contrast. CONTRAST:  5 cc Gadavist COMPARISON:  11/30/2017 FINDINGS: Lower chest: Small bilateral pleural effusions are identified. Hepatobiliary: There are multiple lesions identified within the right lobe of liver. The dominant mass is in segment 8 measuring 7.9 by 5.4 by 5.8 cm. At least 12 additional smaller lesions are identified throughout the right lobe. For example, within segment 6 there is a lesion measuring 1.6 cm, image 50/19. There is moderate distension of the gallbladder. No gallstones identified. Mild dilatation of the intrahepatic ducts to the posterior dome of liver likely reflects occlusion by central liver mass. No common bile duct dilatation. Pancreas: Changes of chronic pancreatitis are identified. Again seen is abnormal dilatation of the dorsal pancreatic duct which measures up to 5.2 mm. Several  cystic structures are identified within the tail of pancreas which measure up to 1.0 cm, similar to previous exam. Spleen:  Within normal limits in size and appearance. Adrenals/Urinary Tract: Normal appearance of the adrenal glands. Bilateral kidney cysts are again noted. No hydronephrosis. T1 hypointense and T2 isointense structure within the interpolar left kidney is identified. This measures 2.0 cm and there may be mild enhancement within this structure following the IV administration of contrast as evident by increased signal intensity units on the postcontrast images, image 45/21 hemorrhagic cyst is noted arising from the posterior cortex of the left kidney measuring 2.1 cm, image 49/17. Several additional hemorrhagic cysts are noted within bilateral inferior poles. Stomach/Bowel: Visualized portions within the abdomen are unremarkable. Vascular/Lymphatic: Aortic atherosclerosis. The main portal vein appears patent. 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 Enlarged porta hepatic node measures 1.2 cm, image 52/22. Prominent gastro patent ligament node measures 0.9 cm, image 34/19. Other:  A trace amount of perihepatic ascites is noted. Musculoskeletal: No suspicious bone lesions identified. IMPRESSION: 1. Examination is positive for multiple liver lesions identified predominantly involving the right lobe of liver and worrisome for metastatic disease. Primary liver neoplasm such as cholangiocarcinoma is not excluded. Correlation with tissue sampling is advised. 2. Mild upper abdominal adenopathy. 3. Multiple Bosniak category 1 and 2 cysts are identified bilaterally. 4. Within the mid left kidney there is a more complex, indeterminate and possible enhancing kidney lesion measuring 2 cm. Small renal cell carcinoma can not be excluded. 5.  Aortic Atherosclerosis (ICD10-I70.0). Electronically Signed   By: Kerby Moors M.D.   On: 09/23/2018 15:34     Medications:   . diclofenac sodium  2 g Topical QID  . folic acid  1 mg Oral Daily  . hydrALAZINE  25 mg Oral Q8H  . hydrocortisone  25 mg Rectal BID  . insulin aspart  0-5 Units Subcutaneous QHS  . insulin aspart  0-9 Units Subcutaneous TID WC  . magnesium oxide  400 mg Oral Daily  . metoprolol tartrate  50 mg Oral BID  . sodium chloride flush  3 mL Intravenous Q12H  . thiamine  100 mg Oral Daily   Continuous Infusions: . sodium chloride    . sodium chloride 75 mL/hr at 09/22/18 1202     LOS: 0 days   Geradine Girt  [facility]   *Please refer to [email or web address], password [email or web address] to get updated schedule on who will round on this patient, as hospitalists switch teams weekly. If 7PM-7AM, please contact night-coverage at [email or web address], password [email or web address] for any overnight needs.  09/23/2018, 3:56 PM

## 2018-09-23 NOTE — Progress Notes (Signed)
Pt refused labs.  

## 2018-09-24 ENCOUNTER — Ambulatory Visit: Payer: Self-pay | Admitting: Physician Assistant

## 2018-09-24 DIAGNOSIS — D649 Anemia, unspecified: Secondary | ICD-10-CM | POA: Diagnosis not present

## 2018-09-24 DIAGNOSIS — R160 Hepatomegaly, not elsewhere classified: Secondary | ICD-10-CM | POA: Diagnosis not present

## 2018-09-24 DIAGNOSIS — K922 Gastrointestinal hemorrhage, unspecified: Secondary | ICD-10-CM | POA: Diagnosis not present

## 2018-09-24 DIAGNOSIS — K648 Other hemorrhoids: Secondary | ICD-10-CM | POA: Diagnosis not present

## 2018-09-24 LAB — BASIC METABOLIC PANEL
Anion gap: 10 (ref 5–15)
BUN: 22 mg/dL (ref 8–23)
CALCIUM: 8.2 mg/dL — AB (ref 8.9–10.3)
CO2: 20 mmol/L — ABNORMAL LOW (ref 22–32)
Chloride: 106 mmol/L (ref 98–111)
Creatinine, Ser: 1.33 mg/dL — ABNORMAL HIGH (ref 0.44–1.00)
GFR calc Af Amer: 46 mL/min — ABNORMAL LOW (ref >60)
GFR calc non Af Amer: 39 mL/min — ABNORMAL LOW (ref >60)
Glucose, Bld: 134 mg/dL — ABNORMAL HIGH (ref 70–99)
Potassium: 4.7 mmol/L (ref 3.5–5.1)
Sodium: 136 mmol/L (ref 135–145)

## 2018-09-24 LAB — CBC
HCT: 26.8 % — ABNORMAL LOW (ref 36.0–46.0)
Hemoglobin: 8.3 g/dL — ABNORMAL LOW (ref 12.0–15.0)
MCH: 26.6 pg (ref 26.0–34.0)
MCHC: 31 g/dL (ref 30.0–36.0)
MCV: 85.9 fL (ref 80.0–100.0)
NRBC: 0 % (ref 0.0–0.2)
PLATELETS: 371 10*3/uL (ref 150–400)
RBC: 3.12 MIL/uL — ABNORMAL LOW (ref 3.87–5.11)
RDW: 15.9 % — AB (ref 11.5–15.5)
WBC: 13.4 10*3/uL — ABNORMAL HIGH (ref 4.0–10.5)

## 2018-09-24 LAB — GLUCOSE, CAPILLARY
Glucose-Capillary: 127 mg/dL — ABNORMAL HIGH (ref 70–99)
Glucose-Capillary: 137 mg/dL — ABNORMAL HIGH (ref 70–99)
Glucose-Capillary: 198 mg/dL — ABNORMAL HIGH (ref 70–99)
Glucose-Capillary: 243 mg/dL — ABNORMAL HIGH (ref 70–99)

## 2018-09-24 MED ORDER — PANTOPRAZOLE SODIUM 40 MG IV SOLR
40.0000 mg | Freq: Two times a day (BID) | INTRAVENOUS | Status: DC
  Administered 2018-09-24 – 2018-09-26 (×5): 40 mg via INTRAVENOUS
  Filled 2018-09-24 (×5): qty 40

## 2018-09-24 NOTE — Progress Notes (Signed)
[facility] PROGRESS NOTE Subjective Patient apparently has not had any further bleeding.  She did have colonoscopy on this admission by Dr. Therisa Doyne that showed that she had diverticuli and hemorrhoids probably the source of her bleeding.  She denies any abdominal pain.  Objective: Vital signs in last 24 hours: Temp:  [97.2 F (36.2 C)-98.6 F (37 C)] 98.3 F (36.8 C) (01/27 1308) Pulse Rate:  [70-110] 89 (01/27 1308) Resp:  [16-18] 16 (01/27 1308) BP: (68-174)/(37-100) 144/80 (01/27 1308) SpO2:  [95 %-100 %] 100 % (01/27 1308) Last BM Date: 09/24/18  Intake/Output from previous day: 01/26 0701 - 01/27 0700 In: 1620.1 [P.O.:240; I.V.:1380.1] Out: 1 [Stool:1] Intake/Output this shift: Total I/O In: 483 [P.O.:480; I.V.:3] Out: 1 [Stool:1]  PE: General--Pleasant female sitting up eating lunch Abdomen--nontender  Lab Results: Recent Labs    09/22/18 0613 09/22/18 1111 09/22/18 1846 09/23/18 2113 09/24/18 0631  WBC 13.6* 13.5* 13.9* 14.7* 13.4*  HGB 7.1* 7.1* 7.7* 8.1* 8.3*  HCT 24.0* 23.2* 26.2* 27.7* 26.8*  PLT 334 335 364 403* 371   BMET Recent Labs    09/22/18 0613 09/23/18 0619 09/24/18 0631  NA 138 136 136  K 4.8 5.6* 4.7  CL 107 107 106  CO2 26 21* 20*  CREATININE 1.53* 1.53* 1.33*   LFT Recent Labs    09/23/18 0619  PROT 4.8*  AST 35  ALT 7  ALKPHOS 183*  BILITOT 0.8   PT/INR Recent Labs    09/21/18 1534  LABPROT 13.6  INR 1.05   PANCREAS No results for input(s): LIPASE in the last 72 hours.       Studies/Results: Mr Liver W Wo Contrast  Result Date: 09/23/2018 CLINICAL DATA:  Evaluate abdominal mass. EXAM: MRI ABDOMEN WITHOUT AND WITH CONTRAST TECHNIQUE: Multiplanar multisequence MR imaging of the abdomen was performed both before and after the administration of intravenous contrast. CONTRAST:  5 cc Gadavist COMPARISON:  11/30/2017 FINDINGS: Lower chest: Small bilateral pleural effusions are identified. Hepatobiliary:  There are multiple lesions identified within the right lobe of liver. The dominant mass is in segment 8 measuring 7.9 by 5.4 by 5.8 cm. At least 12 additional smaller lesions are identified throughout the right lobe. For example, within segment 6 there is a lesion measuring 1.6 cm, image 50/19. There is moderate distension of the gallbladder. No gallstones identified. Mild dilatation of the intrahepatic ducts to the posterior dome of liver likely reflects occlusion by central liver mass. No common bile duct dilatation. Pancreas: Changes of chronic pancreatitis are identified. Again seen is abnormal dilatation of the dorsal pancreatic duct which measures up to 5.2 mm. Several cystic structures are identified within the tail of pancreas which measure up to 1.0 cm, similar to previous exam. Spleen:  Within normal limits in size and appearance. Adrenals/Urinary Tract: Normal appearance of the adrenal glands. Bilateral kidney cysts are again noted. No hydronephrosis. T1 hypointense and T2 isointense structure within the interpolar left kidney is identified. This measures 2.0 cm and there may be mild enhancement within this structure following the IV administration of contrast as evident by increased signal intensity units on the postcontrast images, image 45/21 hemorrhagic cyst is noted arising from the posterior cortex of the left kidney measuring 2.1 cm, image 49/17. Several additional hemorrhagic cysts are noted within bilateral inferior poles. Stomach/Bowel: Visualized portions within the abdomen are unremarkable. Vascular/Lymphatic: Aortic atherosclerosis. The main portal vein appears patent. 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 Enlarged porta hepatic node measures 1.2 cm, image 52/22. Prominent gastro patent ligament node measures 0.9 cm, image 34/19. Other:  A trace amount of perihepatic ascites is noted. Musculoskeletal: No suspicious bone lesions  identified. IMPRESSION: 1. Examination is positive for multiple liver lesions identified predominantly involving the right lobe of liver and worrisome for metastatic disease. Primary liver neoplasm such as cholangiocarcinoma is not excluded. Correlation with tissue sampling is advised. 2. Mild upper abdominal adenopathy. 3. Multiple Bosniak category 1 and 2 cysts are identified bilaterally. 4. Within the mid left kidney there is a more complex, indeterminate and possible enhancing kidney lesion measuring 2 cm. Small renal cell carcinoma can not be excluded. 5.  Aortic Atherosclerosis (ICD10-I70.0). Electronically Signed   By: Kerby Moors M.D.   On: 09/23/2018 15:34    Medications: I have reviewed the patient's current medications.  Assessment:   1.  Hepatic mass.  There are actually multiple masses in the liver consistent with metastatic liver disease.  She does also have a mass in the left kidney and a small renal cell carcinoma cannot be excluded.  Within the past 6 months she has had a CT of the abdomen and pelvis showing that she had had a hysterectomy and had no adnexal masses.  She also has had bilateral mammograms that were negative.  The most likely explanation is that this is metastatic renal disease. 2.  2 cm left renal mass   Plan: 1.  Have discussed this with patient.  I am not sure how much she truly understands.  At this point I would have IR see her to see if we can obtain tissue with percutaneous liver biopsy. 2.  We will order alpha-fetoprotein.  She just had colonoscopy recently as well as EGD   Nancy Fetter 09/24/2018, 3:14 PM  This note was created using voice recognition software. Minor errors may Have occurred unintentionally.  Pager: [phone] If no answer or after hours call [phone]

## 2018-09-24 NOTE — Progress Notes (Signed)
[facility]  Patient Status: Active (receiving services up to time of hospitalization)  [facility] is providing the following services: RN and PT  If patient discharges after hours, please call [phone].   [name] 09/24/2018, 10:28 AM

## 2018-09-24 NOTE — Care Management Obs Status (Signed)
[facility] NOTIFICATION   Patient Details  Name: [name] MRN: [id number] Date of Birth: 06-Nov-1945   Medicare Observation Status Notification Given:  Yes    Erenest Rasher, RN 09/24/2018, 1:26 PM

## 2018-09-24 NOTE — Progress Notes (Signed)
Progress Note    MALLIE GIAMBRA  IRW:[id number] DOB: May 25, 1946  DOA: 09/21/2018 PCP: Nolene Ebbs, MD    Brief Narrative:     Medical records reviewed and are as summarized below:  BLU MCGLAUN is an 73 y.o. 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  That has since resolved and now patient is being worked up by GI for liver masses.    Assessment/Plan:   Active Problems:   Anemia   Bright red blood per rectum  Bright red blood per rectum:  -Suspect hemorrhoidal bleeding that has resolved with anusol suppositories -GI consult appreciated  ABLA/Fe def anemia -Fe low -patient is not symptomatic so will transfuse for <7  hypodensity within the medial right hepatic lobe in an Alcoholic liver disease/Cirrhosis patient - MRI abdomen: multiple liver lesions identified predominantly involving the right lobe of liver and worrisome for metastatic disease. Primary liver neoplasm such as cholangiocarcinoma is not excluded. Correlation with tissue sampling is advised. -hepatitis panel, AMA and ASMA normal  HTN -resume home medications as needed as BP is running high (BP seems liable-- ? If dynamap is accurate)  CKD III -baseline CR 1.5  Hyperkalemia -resolved  DM -SSI -carb mod diet -hold home meds  Per report, patient lives at home alone and has home health.  Not sure patient can manage her medications at home. PT eval recommends SNF.  Discussed with family, brother as well as 80 yo aunt and neither know anything about her medical history nor home situation.  Chart review shows that she is not taking her medications as prescribed at home.  Has home health PT/RN.   Family Communication/Anticipated D/C date and plan/Code  Status   DVT prophylaxis: scd Code Status: Full Code.  Family Communication: called 51 yo aunt Disposition Plan: per GI   Medical Consultants:    GI     Subjective:   C/o nausea  Objective:    Vitals:   09/24/18 0127 09/24/18 0500 09/24/18 0805 09/24/18 0900  BP: (!) 168/80 (!) 174/100 (!) 113/56 (!) 143/75  Pulse: 91 (!) 107 (!) 109 (!) 110  Resp: 18 18 16 18   Temp: 97.9 F (36.6 C) 97.9 F (36.6 C) (!) 97.2 F (36.2 C)   TempSrc: Oral Oral Oral   SpO2: 98% 100% 95% 98%  Weight:      Height:        Intake/Output Summary (Last 24 hours) at 09/24/2018 1254 Last data filed at 09/24/2018 1100 Gross per 24 hour  Intake 1623.08 ml  Output 1 ml  Net 1622.08 ml   Filed Weights   09/21/18 1925  Weight: 54 kg    Exam: In bed, difficult historian Mildly tachy Clear. No wheezing +BS, min tenderness to palpation Alert    Data Reviewed:   I have personally reviewed following labs and imaging studies:  Labs: Labs show the following:   Basic Metabolic Panel: Recent Labs  Lab 09/21/18 1509 09/22/18 0613 09/23/18 0619 09/24/18 0631  NA 137 138 136 136  K 5.3* 4.8 5.6* 4.7  CL 104 107 107 106  CO2 24 26 21* 20*  GLUCOSE 202* 161* 161* 134*  BUN 29* 25* 25* 22  CREATININE 1.79* 1.53* 1.53* 1.33*  CALCIUM 8.8* 8.1* 8.1* 8.2*  GFR Estimated Creatinine Clearance: 26.7 mL/min (A) (by C-G formula based on SCr of 1.33 mg/dL (H)). Liver Function Tests: Recent Labs  Lab 09/21/18 1509 09/23/18 0619  AST 33 35  ALT 11 7  ALKPHOS 214* 183*  BILITOT 0.5 0.8  PROT 6.0* 4.8*  ALBUMIN 2.1* 1.5*   No results for input(s): LIPASE, AMYLASE in the last 168 hours. No results for input(s): AMMONIA in the last 168 hours. Coagulation profile Recent Labs  Lab 09/21/18 1534  INR 1.05    CBC: Recent Labs  Lab 09/21/18 1509  09/22/18 0613 09/22/18 1111 09/22/18 1846 09/23/18 2113 09/24/18 0631  WBC 15.4*   < > 13.6* 13.5* 13.9* 14.7* 13.4*  NEUTROABS  11.7*  --   --   --   --  11.5*  --   HGB 8.4*   < > 7.1* 7.1* 7.7* 8.1* 8.3*  HCT 28.5*   < > 24.0* 23.2* 26.2* 27.7* 26.8*  MCV 86.9   < > 86.0 85.9 87.3 86.6 85.9  PLT 421*   < > 334 335 364 403* 371   < > = values in this interval not displayed.   Cardiac Enzymes: No results for input(s): CKTOTAL, CKMB, CKMBINDEX, TROPONINI in the last 168 hours. BNP (last 3 results) No results for input(s): PROBNP in the last 8760 hours. CBG: Recent Labs  Lab 09/23/18 1140 09/23/18 1631 09/23/18 2046 09/24/18 0659 09/24/18 1100  GLUCAP 161* 84 106* 127* 137*   D-Dimer: No results for input(s): DDIMER in the last 72 hours. Hgb A1c: No results for input(s): HGBA1C in the last 72 hours. Lipid Profile: No results for input(s): CHOL, HDL, LDLCALC, TRIG, CHOLHDL, LDLDIRECT in the last 72 hours. Thyroid function studies: No results for input(s): TSH, T4TOTAL, T3FREE, THYROIDAB in the last 72 hours.  Invalid input(s): FREET3 Anemia work up: Recent Labs    09/22/18 1145  FERRITIN 160  TIBC 115*  IRON 26*   Sepsis Labs: Recent Labs  Lab 09/22/18 1111 09/22/18 1846 09/23/18 2113 09/24/18 0631  WBC 13.5* 13.9* 14.7* 13.4*    Microbiology No results found for this or any previous visit (from the past 240 hour(s)).  Procedures and diagnostic studies:  Mr Liver W Wo Contrast  Result Date: 09/23/2018 CLINICAL DATA:  Evaluate abdominal mass. EXAM: MRI ABDOMEN WITHOUT AND WITH CONTRAST TECHNIQUE: Multiplanar multisequence MR imaging of the abdomen was performed both before and after the administration of intravenous contrast. CONTRAST:  5 cc Gadavist COMPARISON:  11/30/2017 FINDINGS: Lower chest: Small bilateral pleural effusions are identified. Hepatobiliary: There are multiple lesions identified within the right lobe of liver. The dominant mass is in segment 8 measuring 7.9 by 5.4 by 5.8 cm. At least 12 additional smaller lesions are identified throughout the right lobe. For example,  within segment 6 there is a lesion measuring 1.6 cm, image 50/19. There is moderate distension of the gallbladder. No gallstones identified. Mild dilatation of the intrahepatic ducts to the posterior dome of liver likely reflects occlusion by central liver mass. No common bile duct dilatation. Pancreas: Changes of chronic pancreatitis are identified. Again seen is abnormal dilatation of the dorsal pancreatic duct which measures up to 5.2 mm. Several cystic structures are identified within the tail of pancreas which measure up to 1.0 cm, similar to previous exam. Spleen:  Within normal limits in size and appearance. Adrenals/Urinary Tract: Normal appearance of the adrenal glands. Bilateral kidney cysts are again noted. No hydronephrosis. T1 hypointense and T2 isointense structure within the interpolar  left kidney is identified. This measures 2.0 cm and there may be mild enhancement within this structure following the IV administration of contrast as evident by increased signal intensity units on the postcontrast images, image 45/21 hemorrhagic cyst is noted arising from the posterior cortex of the left kidney measuring 2.1 cm, image 49/17. Several additional hemorrhagic cysts are noted within bilateral inferior poles. Stomach/Bowel: Visualized portions within the abdomen are unremarkable. Vascular/Lymphatic: Aortic atherosclerosis. The main portal vein appears patent. 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 Enlarged porta hepatic node measures 1.2 cm, image 52/22. Prominent gastro patent ligament node measures 0.9 cm, image 34/19. Other:  A trace amount of perihepatic ascites is noted. Musculoskeletal: No suspicious bone lesions identified. IMPRESSION: 1. Examination is positive for multiple liver lesions identified predominantly involving the right lobe of liver and worrisome for metastatic disease. Primary liver neoplasm such as cholangiocarcinoma is not  excluded. Correlation with tissue sampling is advised. 2. Mild upper abdominal adenopathy. 3. Multiple Bosniak category 1 and 2 cysts are identified bilaterally. 4. Within the mid left kidney there is a more complex, indeterminate and possible enhancing kidney lesion measuring 2 cm. Small renal cell carcinoma can not be excluded. 5.  Aortic Atherosclerosis (ICD10-I70.0). Electronically Signed   By: Kerby Moors M.D.   On: 09/23/2018 15:34    Medications:   . diclofenac sodium  2 g Topical QID  . folic acid  1 mg Oral Daily  . hydrocortisone  25 mg Rectal BID  . insulin aspart  0-5 Units Subcutaneous QHS  . insulin aspart  0-9 Units Subcutaneous TID WC  . magnesium oxide  400 mg Oral Daily  . metoprolol tartrate  50 mg Oral BID  . pantoprazole (PROTONIX) IV  40 mg Intravenous Q12H  . sodium chloride flush  3 mL Intravenous Q12H  . thiamine  100 mg Oral Daily   Continuous Infusions: . sodium chloride       LOS: 0 days   Geradine Girt  [facility]   *Please refer to [email or web address], password [email or web address] to get updated schedule on who will round on this patient, as hospitalists switch teams weekly. If 7PM-7AM, please contact night-coverage at [email or web address], password [email or web address] for any overnight needs.  09/24/2018, 12:54 PM

## 2018-09-24 NOTE — Clinical Social Work Note (Signed)
Clinical Social Work Assessment  Patient Details  Name: [name] MRN: [id number] Date of Birth: 04-13-1946  Date of referral:  09/24/18               Reason for consult:  Discharge Planning                Permission sought to share information with:  Case Manager Permission granted to share information::  Yes, Release of Information Signed  Name::        Agency::  [facility]  Relationship::     Contact Information:     Housing/Transportation Living arrangements for the past 2 months:  Single Family Home Source of Information:  Patient Patient Interpreter Needed:  None Criminal Activity/Legal Involvement Pertinent to Current Situation/Hospitalization:  No - Comment as needed Significant Relationships:  Siblings, Adult Children Lives with:  Self Do you feel safe going back to the place where you live?  Yes Need for family participation in patient care:  No (Coment)  Care giving concerns:    Patient from home alone and does not have a lot of local family support. Has adult children but they live up [place].    Social Worker assessment / plan:    CSW met with patient at bedside. No family was present. Patient kept talking about how she was feeling. Patient reported that she was from home alone. She has children but they live up [place]. She stated that she has a nurse aid that comes to help her for three hours a day. She is concerned about taking her medication because her nurse aid is not familiar. Patient was finally agreeable to going to a skilled nursing facility. She has been to [facility] in the past and [facility]. CSW gave the patient her bed offers and the patient chose [facility]. CSW called and started insurance authorization with the facility.   Employment status:  Retired Nurse, adult PT Recommendations:  [place] / Referral to community resources:  [place]  Patient/Family's Response to care: Patient is understanding of her current medical condition. She wants to attend SNF so that she is able to return home.   Patient/Family's Understanding of and Emotional Response to Diagnosis, Current Treatment, and Prognosis:  Patient has mixed emotions but is willing to go to rehab so that she is able to regain her strength.   Emotional Assessment Appearance:  Appears stated age Attitude/Demeanor/Rapport:  Unable to Assess Affect (typically observed):  Unable to Assess Orientation:  Oriented to Self, Oriented to Place, Oriented to  Time, Oriented to Situation Alcohol / Substance use:  Not Applicable Psych involvement (Current and /or in the community):  No (Comment)  Discharge Needs  Concerns to be addressed:  Discharge Planning Concerns Readmission within the last 30 days:  No Current discharge risk:  Dependent with Mobility, Lives alone Barriers to Discharge:  Ship broker, Continued Medical Work up   [employer], [place] 09/24/2018, 4:05 PM

## 2018-09-24 NOTE — Progress Notes (Signed)
Pt more alert and responsive; vitals stable after fluid bolus. Will continue to closely monitor. Delia Heady RN   09/23/18 2158  Vitals  Temp 98.1 F (36.7 C)  Temp Source Oral  BP (!) 165/65  MAP (mmHg) 94  BP Location Right Arm  BP Method Manual  Patient Position (if appropriate) Lying  Pulse Rate 93  Pulse Rate Source Dinamap  Resp 18  Oxygen Therapy  SpO2 99 %  O2 Device Room Air  MEWS Score  MEWS RR 0  MEWS Pulse 0  MEWS Systolic 0  MEWS LOC 0  MEWS Temp 0  MEWS Score 0  MEWS Score Color Green

## 2018-09-24 NOTE — Consult Note (Signed)
Chief Complaint: Patient was seen in consultation today for liver lesion biopsy at the request of Dr Princella Ion  Supervising Physician: Arne Cleveland  Patient Status: [facility] - In-pt  History of Present Illness: [name] is a 73 y.o. female   Hx DVT-- no longer on [facility] secondary GI bleed CHF; anemia Presented with BRBPR 1/24 No further bleeding  Hx alcoholic cirrhosis Work up with imaging revealed liver lesion MRI yesterday:  IMPRESSION: 1. Examination is positive for multiple liver lesions identified predominantly involving the right lobe of liver and worrisome for metastatic disease. Primary liver neoplasm such as cholangiocarcinoma is not excluded. Correlation with tissue sampling is advised. 2. Mild upper abdominal adenopathy. 3. Multiple Bosniak category 1 and 2 cysts are identified bilaterally. 4. Within the mid left kidney there is a more complex, indeterminate and possible enhancing kidney lesion measuring 2 cm. Small renal cell carcinoma can not be excluded.  Request for liver biopsy Imaging reviewed and approved by Dr Vernard Gambles    Past Medical History:  Diagnosis Date  . Acute venous embolism and thrombosis of deep vessels of proximal lower extremity ([place])   . Alcoholic cirrhosis ([place])   . Anemia   . Anxiety   . Arthritis   . Asthma   . Back pain   . Blood transfusion few years ago  . Cellulitis    bilateral lower extremities  . CHF (congestive heart failure) ([place]) 10/13   grade 1 diastolic dysfunction, Nl LVF  . Chronic diastolic congestive heart failure ([place])   . Chronic obstructive pulmonary disease (COPD) ([place])   . Colon polyps   . Depression   . Diabetes mellitus    insulin dep   . Gastroesophageal reflux   . GI bleeding 08/28/2018  . Glaucoma    left  . Gout   . Hepatitis    alcoholic hepatitis  . History of GI diverticular bleed   . Hypertension   . Leg swelling   . On home oxygen therapy    "2L just at night" (08/28/2018)  .  Pneumonia    history of  . PVD (peripheral vascular disease) ([place]) 2009   bilat iliac stenting  . Reflux esophagitis   . Villous adenoma of colon 05/10/11    Past Surgical History:  Procedure Laterality Date  . ABDOMINAL HYSTERECTOMY  yrs ago  . BIOPSY  08/31/2018   Procedure: BIOPSY;  Surgeon: [name], MD;  Location: [place];  Service: Gastroenterology;;  . CATARACT EXTRACTION Right yrs ago  . COLONOSCOPY    . COLONOSCOPY WITH PROPOFOL N/A 08/06/2013   Procedure: COLONOSCOPY WITH PROPOFOL;  Surgeon: Lear Ng, MD;  Location: [facility];  Service: Endoscopy;  Laterality: N/A;  . COLONOSCOPY WITH PROPOFOL N/A 08/31/2018   Procedure: COLONOSCOPY WITH PROPOFOL;  Surgeon: [name], MD;  Location: [place];  Service: Gastroenterology;  Laterality: N/A;  . ESOPHAGOGASTRODUODENOSCOPY (EGD) WITH PROPOFOL N/A 08/06/2013   Procedure: ESOPHAGOGASTRODUODENOSCOPY (EGD) WITH PROPOFOL;  Surgeon: Lear Ng, MD;  Location: [facility];  Service: Endoscopy;  Laterality: N/A;  . ESOPHAGOGASTRODUODENOSCOPY (EGD) WITH PROPOFOL N/A 08/30/2018   Procedure: ESOPHAGOGASTRODUODENOSCOPY (EGD) WITH PROPOFOL;  Surgeon: [name], MD;  Location: [place];  Service: Gastroenterology;  Laterality: N/A;  . LAPAROSCOPIC ASSISSTED TOTAL COLECTOMY W/ J-POUCH  04/22/11  . VENTRAL HERNIA REPAIR  06/13/2012   Procedure: LAPAROSCOPIC VENTRAL HERNIA;  Surgeon: Adin Hector, MD;  Location: [place];  Service: General;  Laterality: N/A;  Laparoscopic Assisted Ventral Hernia with Mesh  Allergies: Compazine [prochlorperazine edisylate]; Penicillins; Phenergan [promethazine hcl]; Lipitor [atorvastatin]; Chocolate; Lactose intolerance (gi); Other; and Pork-derived products  Medications: Prior to Admission medications   Medication Sig Start Date End Date Taking? Authorizing Provider  acetaminophen (TYLENOL) 325 MG tablet Take 2 tablets (650 mg total) by mouth every 6 (six) hours as needed for  mild pain, fever or headache (or Fever >/= 101). 09/01/18  Yes Emokpae, Courage, MD  acetaminophen (TYLENOL) 500 MG tablet Take 500 mg by mouth daily as needed for moderate pain.   Yes [provider]  albuterol (PROVENTIL HFA;VENTOLIN HFA) 108 (90 BASE) MCG/ACT inhaler Inhale 2 puffs into the lungs every 6 (six) hours as needed. For shortness of breath   Yes [provider]  albuterol (PROVENTIL) (2.5 MG/3ML) 0.083% nebulizer solution Take 2.5 mg by nebulization every 6 (six) hours as needed for wheezing or shortness of breath.   Yes [provider]  allopurinol (ZYLOPRIM) 100 MG tablet Take 100 mg by mouth daily.  01/18/16  Yes [provider]  aspirin 81 MG EC tablet Take 1 tablet (81 mg total) by mouth daily with breakfast. 09/01/18  Yes Emokpae, Courage, MD  cilostazol (PLETAL) 100 MG tablet Take 50 mg by mouth 2 (two) times daily. 08/07/18  Yes [provider]  diclofenac sodium (VOLTAREN) 1 % GEL Apply 2 g topically 4 (four) times daily.  05/21/18  Yes [provider]  ergocalciferol (VITAMIN D2) 50000 UNITS capsule Take 50,000 Units by mouth every Monday. On Monday   Yes [provider]  fluticasone (FLONASE) 50 MCG/ACT nasal spray Place 2 sprays into the nose daily as needed for allergies.    Yes [provider]  furosemide (LASIX) 40 MG tablet Take 40 mg by mouth daily as needed (for swelling of the legs).  09/10/18  Yes [provider]  glimepiride (AMARYL) 4 MG tablet Take 1 tablet (4 mg total) by mouth daily with breakfast. 09/01/18  Yes Emokpae, Courage, MD  LANTUS SOLOSTAR 100 UNIT/ML Solostar Pen Inject 26 Units into the skin every morning. Patient taking differently: Inject 10 Units into the skin every morning.  09/01/18  Yes Emokpae, Courage, MD  metoprolol tartrate (LOPRESSOR) 50 MG tablet Take 25 mg by mouth 2 (two) times daily.  09/14/18  Yes [provider]  montelukast (SINGULAIR) 10 MG tablet Take 10  mg by mouth at bedtime.    Yes [provider]  omeprazole (PRILOSEC) 40 MG capsule Take 40 mg by mouth daily.    Yes [provider]  OXYGEN Inhale 2 L into the lungs at bedtime.   Yes [provider]  simvastatin (ZOCOR) 10 MG tablet Take 10 mg by mouth daily.  11/30/14  Yes [provider]  sitaGLIPtin (JANUVIA) 100 MG tablet Take 100 mg by mouth daily.  03/27/14  Yes [provider]  triamcinolone ointment (KENALOG) 0.1 % Apply 1 application topically at bedtime.  05/21/18  Yes [provider]  ferrous sulfate 325 (65 FE) MG tablet Take 325 mg by mouth daily with breakfast.    [provider]  folic acid (FOLVITE) 1 MG tablet Take 1 tablet (1 mg total) by mouth daily. Patient not taking: Reported on 09/22/2018 08/18/15   Modena Jansky, MD  lisinopril (PRINIVIL,ZESTRIL) 5 MG tablet Take 5 mg by mouth daily. 01/18/16   [provider]  magnesium oxide (MAG-OX) 400 MG tablet Take 400 mg by mouth daily.    [provider]  methocarbamol (ROBAXIN) 500  MG tablet Take 1 tablet (500 mg total) by mouth every 8 (eight) hours as needed for muscle spasms. Patient not taking: Reported on 09/22/2018 02/17/16   Mesner, Corene Cornea, MD  OXYGEN-HELIUM IN Inhale 2 L into the lungs at bedtime. 2 liters nightly    [provider]  thiamine 100 MG tablet Take 1 tablet (100 mg total) by mouth daily. Patient not taking: Reported on 09/22/2018 08/18/15   Modena Jansky, MD  traMADol (ULTRAM) 50 MG tablet Take 1 tablet (50 mg total) by mouth every 6 (six) hours as needed. Patient not taking: Reported on 09/22/2018 05/06/18   Ward, Ozella Almond, PA-C     Family History  Problem Relation Age of Onset  . Cancer Brother        heent ca  . Cancer Sister        liver ca    Social History   Socioeconomic History  . Marital status: Legally Separated    Spouse name: Not on file  . Number of children: Not on file  . Years of  education: Not on file  . Highest education level: Not on file  Occupational History  . Not on file  Social Needs  . Financial resource strain: Not on file  . Food insecurity:    Worry: Not on file    Inability: Not on file  . Transportation needs:    Medical: Not on file    Non-medical: Not on file  Tobacco Use  . Smoking status: Current Some Day Smoker    Packs/day: 0.50    Years: 30.00    Pack years: 15.00    Types: Cigarettes    Last attempt to quit: 11/06/2005    Years since quitting: 12.8  . Smokeless tobacco: Never Used  Substance and Sexual Activity  . Alcohol use: No    Comment: stopped 2005  . Drug use: No  . Sexual activity: Not on file  Lifestyle  . Physical activity:    Days per week: Not on file    Minutes per session: Not on file  . Stress: Not on file  Relationships  . Social connections:    Talks on phone: Not on file    Gets together: Not on file    Attends religious service: Not on file    Active member of club or organization: Not on file    Attends meetings of clubs or organizations: Not on file    Relationship status: Not on file  Other Topics Concern  . Not on file  Social History Narrative  . Not on file    Review of Systems: A 12 point ROS discussed and pertinent positives are indicated in the HPI above.  All other systems are negative.  Review of Systems  Constitutional: Positive for activity change and fatigue. Negative for appetite change and fever.  Respiratory: Negative for cough and shortness of breath.   Cardiovascular: Negative for chest pain.  Gastrointestinal: Positive for abdominal distention and abdominal pain.  Neurological: Positive for weakness.  Psychiatric/Behavioral: Negative for behavioral problems and confusion.    Vital Signs: BP (!) 144/80 (BP Location: Right Arm)   Pulse 89   Temp 98.3 F (36.8 C) (Oral)   Resp 16   Ht 5' (1.524 m)   Wt 119 lb 0.8 oz (54 kg)   SpO2 100%   BMI 23.25 kg/m   Physical  Exam Vitals signs reviewed.  Cardiovascular:     Rate and Rhythm: Normal rate and regular  rhythm.     Heart sounds: Normal heart sounds.  Pulmonary:     Effort: Pulmonary effort is normal.     Breath sounds: Normal breath sounds.  Abdominal:     General: Bowel sounds are normal. There is distension.     Tenderness: There is abdominal tenderness.  Musculoskeletal: Normal range of motion.  Skin:    General: Skin is warm and dry.  Neurological:     General: No focal deficit present.     Mental Status: She is alert and oriented to person, place, and time.  Psychiatric:        Mood and Affect: Mood normal.        Behavior: Behavior normal.        Thought Content: Thought content normal.        Judgment: Judgment normal.     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 COMPARISON:  Head CT August 14, 2015 and brain MRI August 14, 2015 FINDINGS: Brain: There is mild age related volume loss. There is no intracranial mass, hemorrhage, extra-axial fluid collection, or midline shift. There is slight small vessel disease in the centra semiovale bilaterally. No acute infarct evident. Vascular: There is no hyperdense vessel. There is calcification in each carotid siphon region. Skull: Bony calvarium appears intact, although bones are osteoporotic. Sinuses/Orbits: There is mucosal thickening in several ethmoid air cells. Orbits appear symmetric bilaterally. Other: Mastoid air cells are clear. There is debris in each external auditory canal. IMPRESSION: Age related volume loss with slight periventricular small vessel disease. No mass or hemorrhage. No evident acute infarct. Foci of arterial vascular calcification noted. Mucosal thickening in several ethmoid air cells. Probable cerumen in each  external auditory canal. Bones osteoporotic. Electronically Signed   By: Lowella Grip III M.D.   On: 08/28/2018 15:08   Mr Liver W EL Contrast  Result Date: 09/23/2018 CLINICAL DATA:  Evaluate abdominal mass. EXAM: MRI ABDOMEN WITHOUT AND WITH CONTRAST TECHNIQUE: Multiplanar multisequence MR imaging of the abdomen was performed both before and after the administration of intravenous contrast. CONTRAST:  5 cc Gadavist COMPARISON:  11/30/2017 FINDINGS: Lower chest: Small bilateral pleural effusions are identified. Hepatobiliary: There are multiple lesions identified within the right lobe of liver. The dominant mass is in segment 8 measuring 7.9 by 5.4 by 5.8 cm. At least 12 additional smaller lesions are identified throughout the right lobe. For example, within segment 6 there is a lesion measuring 1.6 cm, image 50/19. There is moderate distension of the gallbladder. No gallstones identified. Mild dilatation of the intrahepatic ducts to the posterior dome of liver likely reflects occlusion by central liver mass. No common bile duct dilatation. Pancreas: Changes of chronic pancreatitis are identified. Again seen is abnormal dilatation of the dorsal pancreatic duct which measures up to 5.2 mm. Several cystic structures are identified within the tail of pancreas which measure up to 1.0 cm, similar to previous exam. Spleen:  Within normal limits in size and appearance. Adrenals/Urinary Tract: Normal appearance of the adrenal glands. Bilateral kidney cysts are again noted. No hydronephrosis. T1 hypointense and T2 isointense structure within the interpolar left kidney is identified. This measures 2.0 cm and there may be mild enhancement within this structure following the IV administration of contrast as evident by increased signal intensity units on the postcontrast images,  image 45/21 hemorrhagic cyst is noted arising from the posterior cortex of the left kidney measuring 2.1 cm, image 49/17. Several additional  hemorrhagic cysts are noted within bilateral inferior poles. Stomach/Bowel: Visualized portions within the abdomen are unremarkable. Vascular/Lymphatic: Aortic atherosclerosis. The main portal vein appears patent. 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 Enlarged porta hepatic node measures 1.2 cm, image 52/22. Prominent gastro patent ligament node measures 0.9 cm, image 34/19. Other:  A trace amount of perihepatic ascites is noted. Musculoskeletal: No suspicious bone lesions identified. IMPRESSION: 1. Examination is positive for multiple liver lesions identified predominantly involving the right lobe of liver and worrisome for metastatic disease. Primary liver neoplasm such as cholangiocarcinoma is not excluded. Correlation with tissue sampling is advised. 2. Mild upper abdominal adenopathy. 3. Multiple Bosniak category 1 and 2 cysts are identified bilaterally. 4. Within the mid left kidney there is a more complex, indeterminate and possible enhancing kidney lesion measuring 2 cm. Small renal cell carcinoma can not be excluded. 5.  Aortic Atherosclerosis (ICD10-I70.0). Electronically Signed   By: Kerby Moors M.D.   On: 09/23/2018 15:34   Dg Chest Port 1 View  Result Date: 08/28/2018 CLINICAL DATA:  Bloody stool.  Hematemesis. EXAM: PORTABLE CHEST 1 VIEW COMPARISON:  08/20/2018. FINDINGS: Mediastinum and hilar structures normal. Stable coarse interstitial markings again noted. No acute infiltrate. No pleural effusion or pneumothorax. Degenerative changes and scoliosis thoracic spine. Diffuse osteopenia. IMPRESSION: Stable chronic change again noted.  No acute abnormality identified. Electronically Signed   By: Marcello Moores  Register   On: 08/28/2018 13:20    Labs:  CBC: Recent Labs    09/22/18 1111 09/22/18 1846 09/23/18 2113 09/24/18 0631  WBC 13.5* 13.9* 14.7* 13.4*  HGB 7.1* 7.7* 8.1* 8.3*  HCT 23.2* 26.2* 27.7* 26.8*  PLT 335 364 403*  371    COAGS: Recent Labs    08/28/18 1410 09/21/18 1534  INR 1.10 1.05    BMP: Recent Labs    09/21/18 1509 09/22/18 0613 09/23/18 0619 09/24/18 0631  NA 137 138 136 136  K 5.3* 4.8 5.6* 4.7  CL 104 107 107 106  CO2 24 26 21* 20*  GLUCOSE 202* 161* 161* 134*  BUN 29* 25* 25* 22  CALCIUM 8.8* 8.1* 8.1* 8.2*  CREATININE 1.79* 1.53* 1.53* 1.33*  GFRNONAA 27* 33* 33* 39*  GFRAA 32* 38* 38* 46*    LIVER FUNCTION TESTS: Recent Labs    08/20/18 1649 08/28/18 1301 09/21/18 1509 09/23/18 0619  BILITOT 0.6 0.4 0.5 0.8  AST 34 27 33 35  ALT 13 11 11 7   ALKPHOS 194* 207* 214* 183*  PROT 5.8* 6.4* 6.0* 4.8*  ALBUMIN 1.9* 2.2* 2.1* 1.5*    TUMOR MARKERS: No results for input(s): AFPTM, CEA, CA199, CHROMGRNA in the last 8760 hours.  Assessment and Plan:  GI bleed -- resolved Anemia Work up revealed liver lesion; known ETOH cirrhosis Scheduled for liver lesion biopsy 1/28 Risks and benefits discussed with the patient including, but not limited to bleeding, infection, damage to adjacent structures or low yield requiring additional tests.  All of the patient's questions were answered, patient is agreeable to proceed. Consent signed and in chart.   Thank you for this interesting consult.  I greatly enjoyed meeting [name] and look forward to participating in their care.  A copy of this report was sent to the requesting provider on this date.  Electronically Signed: Olin Hauser  A Ahren Pettinger, PA-C 09/24/2018, 3:18 PM   I spent a total of 40 Minutes    in face to face in clinical consultation, greater than 50% of which was counseling/coordinating care for liver  Lesion biopsy

## 2018-09-24 NOTE — NC FL2 (Addendum)
[place] MEDICAID FL2 LEVEL OF CARE SCREENING TOOL     IDENTIFICATION  Patient Name: [name] Birthdate: 1946/01/10 Sex: female Admission Date (Current Location): 09/21/2018  [facility] and [place] Number:  Herbalist and Address:  [facility], [place] [address] [place], [place] [address]      Provider Number: [id number]  Attending Physician Name and Address:  Geradine Girt, DO  Relative Name and Phone Number:  Carolynn Serve, [place], Brother, [phone]    Current Level of Care: Hospital Recommended Level of Care: [place] Prior Approval Number: [id number]  Date Approved/Denied: 06/18/14 PASRR Number: [id number]   Discharge Plan: SNF    Current Diagnoses: Patient Active Problem List   Diagnosis Date Noted  . AKI (acute kidney injury) ([place]) 08/28/2018  . Gout 08/10/2015  . Hypoglycemia   . Hypothermia   . UTI (lower urinary tract infection)   . Humeral head fracture 06/05/2015  . Idiopathic chronic gout of multiple sites without tophus 06/05/2015  . Chronic renal disease, stage III ([place]) 06/05/2015  . Nausea vomiting and diarrhea 05/09/2015  . Increased urinary frequency 05/09/2015  . Sepsis ([place]) 05/09/2015  . Hypokalemia   . Chronic diastolic congestive heart failure ([place])   . Hyperlipidemia 02/10/2015  . Cellulitis 11/04/2014  . Essential hypertension 08/27/2014  . Abdominal pain, acute, bilateral lower quadrant 07/26/2014  . Colitis 07/26/2014  . GI bleeding 06/14/2014  . Bright red blood per rectum 06/14/2014  . Acute encephalopathy 06/14/2014  . Personal history of colonic polyps 08/06/2013  . Knee pain, acute 05/20/2013  . Claudication ([place]) 03/13/2013  . Obesity 03/13/2013  . PVD, LEIA/LIIA and RCIA pta 7/09- ABIs 0.68 and 0.69 Feb 2014 11/17/2012  . PUD (peptic ulcer disease) GI bleed in past (2011) 11/17/2012  . Cirrhosis of liver ([place]) 11/17/2012  . Colon  cancer, lap chole 2012 11/17/2012  . 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 Atypical chest pain- no history of CAD. Myoview low risk 2012 11/15/2012  . Controlled diabetes mellitus type 2 with complications ([place]) 16/08/930  . Acute renal failure superimposed on stage 3 chronic kidney disease ([place]) 06/16/2012  . Incisional hernia 01/09/2012  . Anemia 01/28/2011  . COPD (chronic obstructive pulmonary disease) ([place]) 01/28/2011  . Sleep apnea 01/28/2011  . Transfusion history 01/28/2011  . Sinus problem 01/28/2011  . Full dentures 01/28/2011    Orientation RESPIRATION BLADDER Height & Weight     Self, Time, Situation, Place  Normal Continent, External catheter Weight: 119 lb 0.8 oz (54 kg) Height:  5' (152.4 cm)  BEHAVIORAL SYMPTOMS/MOOD NEUROLOGICAL BOWEL NUTRITION STATUS      Continent Diet(Heart Healthy/Carb Mod, thin liquids)  AMBULATORY STATUS COMMUNICATION OF NEEDS Skin   Independent Verbally Normal                       Personal Care Assistance Level of Assistance  Bathing, Feeding, Dressing, Total care Bathing Assistance: Limited assistance Feeding assistance: Independent Dressing Assistance: Limited assistance Total Care Assistance: Limited assistance   Functional Limitations Info  Sight, Hearing, Speech Sight Info: Adequate Hearing Info: Adequate Speech Info: Adequate    SPECIAL CARE FACTORS FREQUENCY  PT (By licensed PT), OT (By licensed OT)     PT Frequency: 5x/wk OT Frequency: 5x/wk            Contractures Contractures Info: Not present    Additional Factors Info  Code Status,  Allergies, Insulin Sliding Scale Code Status Info: Full Code Allergies Info: Compazine Prochlorperazine Edisylate, Penicillins, Phenergan Promethazine Hcl, Lipitor Atorvastatin, Chocolate, Lactose Intolerance (Gi), Other, Pork-derived Products   Insulin Sliding Scale Info: insulin aspart novolog 0-5 units daily at bedtime; insulin aspart novolog 0-9  units 3x daily w/meals       Current Medications (09/24/2018):  This is the current hospital active medication list Current Facility-Administered Medications  Medication Dose Route Frequency Provider Last Rate Last Dose  . 0.9 %  sodium chloride infusion  250 mL Intravenous PRN Lady Deutscher, MD      . acetaminophen (TYLENOL) tablet 650 mg  650 mg Oral Q6H PRN Lady Deutscher, MD   650 mg at 09/23/18 0809   Or  . acetaminophen (TYLENOL) suppository 650 mg  650 mg Rectal Q6H PRN Lady Deutscher, MD      . albuterol (PROVENTIL) (2.5 MG/3ML) 0.083% nebulizer solution 2.5 mg  2.5 mg Nebulization Q6H PRN Eulogio Bear U, DO      . diclofenac sodium (VOLTAREN) 1 % transdermal gel 2 g  2 g Topical QID Vann, Jessica U, DO   2 g at 09/24/18 0939  . fluticasone (FLONASE) 50 MCG/ACT nasal spray 2 spray  2 spray Each Nare Daily PRN Eulogio Bear U, DO      . folic acid (FOLVITE) tablet 1 mg  1 mg Oral Daily Vann, Jessica U, DO   1 mg at 09/24/18 0939  . hydrocortisone (ANUSOL-HC) suppository 25 mg  25 mg Rectal BID Brahmbhatt, Parag, MD   25 mg at 09/24/18 0940  . insulin aspart (novoLOG) injection 0-5 Units  0-5 Units Subcutaneous QHS Vann, Jessica U, DO      . insulin aspart (novoLOG) injection 0-9 Units  0-9 Units Subcutaneous TID WC Vann, Jessica U, DO   1 Units at 09/24/18 1228  . magnesium oxide (MAG-OX) tablet 400 mg  400 mg Oral Daily Eulogio Bear U, DO   400 mg at 09/24/18 2440  . methocarbamol (ROBAXIN) tablet 500 mg  500 mg Oral Q8H PRN Eulogio Bear U, DO   500 mg at 09/22/18 1733  . metoprolol tartrate (LOPRESSOR) tablet 50 mg  50 mg Oral BID Eulogio Bear U, DO   50 mg at 09/24/18 1027  . ondansetron (ZOFRAN) injection 4 mg  4 mg Intravenous Q6H PRN Blount, Xenia T, NP      . oxyCODONE (Oxy IR/ROXICODONE) immediate release tablet 5 mg  5 mg Oral Q4H PRN Lady Deutscher, MD   5 mg at 09/23/18 0809  . pantoprazole (PROTONIX) injection 40 mg  40 mg Intravenous Q12H Vann, Jessica  U, DO   40 mg at 09/24/18 1023  . sodium chloride flush (NS) 0.9 % injection 3 mL  3 mL Intravenous Q12H Lady Deutscher, MD   3 mL at 09/24/18 0940  . sodium chloride flush (NS) 0.9 % injection 3 mL  3 mL Intravenous PRN Lady Deutscher, MD      . thiamine (VITAMIN B-1) tablet 100 mg  100 mg Oral Daily Eulogio Bear U, DO   100 mg at 09/24/18 0939  . traZODone (DESYREL) tablet 25 mg  25 mg Oral QHS PRN Lady Deutscher, MD         Discharge Medications: Please see discharge summary for a list of discharge medications.  Relevant Imaging Results:  Relevant Lab Results:   Additional Information SSN: [id number]  Philippa Chester Pinion, LCSWA

## 2018-09-24 NOTE — Evaluation (Signed)
Physical Therapy Evaluation Patient Details Name: [name] MRN: [id number] DOB: 1945/09/22 Today's Date: 09/24/2018   History of Present Illness  Pt is a 73 y/o female with a PMH significant for COPD, DM, HTN. Pt presents with BRBPR and was admitted under observation for GI bleed.  Clinical Impression  Pt admitted with above diagnosis. Pt currently with functional limitations due to the deficits listed below (see PT Problem List). At the time of PT eval pt was very lethargic and required increased time to arouse enough to participate with session. Pt continually reporting she could not get up until she heard "Jesus Loves Me". PT pulled up song to play and pt began mobilizing towards EOB. Overall tolerance for functional activity is low and pt consistently requiring min-mod assist for balance support and safety throughout. We were not able to assess true ambulation this session as pt could only tolerate side steps EOB for repositioning. At this time pt is at a high risk for falls and feel she could benefit from continued therapy at the SNF level prior to return home. Pt will benefit from skilled PT to increase their independence and safety with mobility to allow discharge to the venue listed below.     Follow Up Recommendations SNF;Supervision/Assistance - 24 hour    Equipment Recommendations  Rolling walker with 5" wheels    Recommendations for Other Services       Precautions / Restrictions Precautions Precautions: Fall Restrictions Weight Bearing Restrictions: No      Mobility  Bed Mobility Overal bed mobility: Needs Assistance Bed Mobility: Rolling;Sidelying to Sit;Sit to Supine Rolling: Supervision Sidelying to sit: Mod assist   Sit to supine: Mod assist   General bed mobility comments: Assist for trunk elevation to full sitting position as well as to elevate LE's back up into the bed. Pt was cued for use of rails for support. Significant amount of increased time and  effort required to complete bed mobility.   Transfers Overall transfer level: Needs assistance Equipment used: Rolling walker (2 wheeled);None Transfers: Sit to/from Stand Sit to Stand: Mod assist         General transfer comment: Assist to power-up to full stand and to gain/maintain standing balance. Pt refused RW use.   Ambulation/Gait Ambulation/Gait assistance: Min assist Gait Distance (Feet): 2 Feet Assistive device: 1 person hand held assist Gait Pattern/deviations: Step-to pattern;Decreased stride length;Trunk flexed Gait velocity: Decreased Gait velocity interpretation: 1.31 - 2.62 ft/sec, indicative of limited community ambulator General Gait Details: Pt extremely flexed and unable to improve posture. Pt was able to take a few side steps to [facility] for repositioning.   Stairs            Wheelchair Mobility    Modified Rankin (Stroke Patients Only)       Balance Overall balance assessment: Needs assistance Sitting-balance support: No upper extremity supported;Feet supported Sitting balance-Leahy Scale: Poor Sitting balance - Comments: Unable to don her own socks   Standing balance support: Bilateral upper extremity supported;During functional activity Standing balance-Leahy Scale: Poor Standing balance comment: relies on UE support/external assist for balance                             Pertinent Vitals/Pain Pain Assessment: Faces Faces Pain Scale: Hurts little more Pain Location: Pt reports pain in her L ankle but is able to perform AROM without difficulty.     Home Living Family/patient expects to be discharged to::  Private residence Living Arrangements: Alone Available Help at Discharge: Personal care attendant(1.5 hours day ) Type of Home: [place] Access: Level entry     Home Layout: One [place]: [place] - 2 wheels;Walker - 4 wheels;Bedside commode;Cane - single point;Cane - quad;Tub bench Additional Comments: Above  information taken from prior admission at the beginning of the month (08/2018).    Prior Function Level of Independence: Needs assistance   Gait / Transfers Assistance Needed: ambulates with cane and rollator outdoors  ADL's / Homemaking Assistance Needed: B/D with assist   Comments: Above information taken from prior admission. Pt does report that she has a cane and rollator that she tries not to use. States she is able to perform ADL's without assistance.      Hand Dominance   Dominant Hand: Right    Extremity/Trunk Assessment   Upper Extremity Assessment Upper Extremity Assessment: Defer to OT evaluation    Lower Extremity Assessment Lower Extremity Assessment: Generalized weakness(Flexion contracture bilateral knees?)    Cervical / Trunk Assessment Cervical / Trunk Assessment: Other exceptions Cervical / Trunk Exceptions: Forward head posture with rounded shoulders  Communication   Communication: No difficulties  Cognition Arousal/Alertness: Awake/alert Behavior During Therapy: WFL for tasks assessed/performed Overall Cognitive Status: Within Functional Limits for tasks assessed                                        General Comments      Exercises     Assessment/Plan    PT Assessment Patient needs continued PT services  PT Problem List Decreased activity tolerance;Decreased balance;Decreased mobility;Decreased knowledge of use of DME;Decreased safety awareness;Decreased knowledge of precautions       PT Treatment Interventions DME instruction;Gait training;Functional mobility training;Therapeutic activities;Therapeutic exercise;Balance training;Patient/family education    PT Goals (Current goals can be found in the Care Plan section)  Acute Rehab PT Goals Patient Stated Goal: Go back to sleep, get warm.  PT Goal Formulation: With patient Time For Goal Achievement: 10/08/18 Potential to Achieve Goals: Good    Frequency Min 2X/week    Barriers to discharge Decreased caregiver support      Co-evaluation               AM-PAC PT "6 Clicks" Mobility  Outcome Measure Help needed turning from your back to your side while in a flat bed without using bedrails?: None Help needed moving from lying on your back to sitting on the side of a flat bed without using bedrails?: A Little Help needed moving to and from a bed to a chair (including a wheelchair)?: A Lot Help needed standing up from a chair using your arms (e.g., wheelchair or bedside chair)?: A Little Help needed to walk in hospital room?: A Lot Help needed climbing 3-5 steps with a railing? : Total 6 Click Score: 15    End of Session Equipment Utilized During Treatment: Gait belt Activity Tolerance: Patient limited by lethargy;Patient limited by fatigue Patient left: in bed;with call bell/phone within reach;with bed alarm set Nurse Communication: Mobility status PT Visit Diagnosis: Muscle weakness (generalized) (M62.81)    Time: 6440-3474 PT Time Calculation (min) (ACUTE ONLY): 21 min   Charges:   PT Evaluation $PT Eval Moderate Complexity: 1 Mod          Rolinda Roan, PT, DPT Acute Rehabilitation Services Pager: [phone] Office: [phone]   Thelma Comp 09/24/2018,  10:52 AM

## 2018-09-25 ENCOUNTER — Observation Stay (HOSPITAL_COMMUNITY): Payer: Medicare Other

## 2018-09-25 DIAGNOSIS — D509 Iron deficiency anemia, unspecified: Secondary | ICD-10-CM | POA: Diagnosis not present

## 2018-09-25 DIAGNOSIS — R160 Hepatomegaly, not elsewhere classified: Secondary | ICD-10-CM | POA: Diagnosis not present

## 2018-09-25 DIAGNOSIS — E1111 Type 2 diabetes mellitus with ketoacidosis with coma: Secondary | ICD-10-CM

## 2018-09-25 DIAGNOSIS — K648 Other hemorrhoids: Secondary | ICD-10-CM | POA: Diagnosis not present

## 2018-09-25 DIAGNOSIS — D72829 Elevated white blood cell count, unspecified: Secondary | ICD-10-CM

## 2018-09-25 DIAGNOSIS — I739 Peripheral vascular disease, unspecified: Secondary | ICD-10-CM

## 2018-09-25 DIAGNOSIS — K922 Gastrointestinal hemorrhage, unspecified: Secondary | ICD-10-CM | POA: Diagnosis not present

## 2018-09-25 LAB — BASIC METABOLIC PANEL
Anion gap: 9 (ref 5–15)
BUN: 22 mg/dL (ref 8–23)
CO2: 22 mmol/L (ref 22–32)
Calcium: 8.9 mg/dL (ref 8.9–10.3)
Chloride: 109 mmol/L (ref 98–111)
Creatinine, Ser: 1.51 mg/dL — ABNORMAL HIGH (ref 0.44–1.00)
GFR calc Af Amer: 39 mL/min — ABNORMAL LOW (ref >60)
GFR calc non Af Amer: 34 mL/min — ABNORMAL LOW (ref >60)
Glucose, Bld: 112 mg/dL — ABNORMAL HIGH (ref 70–99)
Potassium: 4.5 mmol/L (ref 3.5–5.1)
SODIUM: 140 mmol/L (ref 135–145)

## 2018-09-25 LAB — CBC
HCT: 30.4 % — ABNORMAL LOW (ref 36.0–46.0)
Hemoglobin: 9.3 g/dL — ABNORMAL LOW (ref 12.0–15.0)
MCH: 26.5 pg (ref 26.0–34.0)
MCHC: 30.6 g/dL (ref 30.0–36.0)
MCV: 86.6 fL (ref 80.0–100.0)
Platelets: UNDETERMINED 10*3/uL (ref 150–400)
RBC: 3.51 MIL/uL — AB (ref 3.87–5.11)
RDW: 16.4 % — ABNORMAL HIGH (ref 11.5–15.5)
WBC: 15.1 10*3/uL — ABNORMAL HIGH (ref 4.0–10.5)
nRBC: 0 % (ref 0.0–0.2)

## 2018-09-25 LAB — TYPE AND SCREEN
ABO/RH(D): O POS
Antibody Screen: NEGATIVE
Donor AG Type: NEGATIVE
Donor AG Type: NEGATIVE
UNIT DIVISION: 0
UNIT DIVISION: 0

## 2018-09-25 LAB — BPAM RBC
Blood Product Expiration Date: 202002252359
Blood Product Expiration Date: 202002252359
Unit Type and Rh: 5100
Unit Type and Rh: 5100

## 2018-09-25 LAB — GLUCOSE, CAPILLARY
Glucose-Capillary: 91 mg/dL (ref 70–99)
Glucose-Capillary: 133 mg/dL — ABNORMAL HIGH (ref 70–99)
Glucose-Capillary: 167 mg/dL — ABNORMAL HIGH (ref 70–99)
Glucose-Capillary: 162 mg/dL — ABNORMAL HIGH (ref 70–99)

## 2018-09-25 MED ORDER — MIDAZOLAM HCL 2 MG/2ML IJ SOLN
INTRAMUSCULAR | Status: AC | PRN
  Administered 2018-09-25: 1 mg via INTRAVENOUS

## 2018-09-25 MED ORDER — FENTANYL CITRATE (PF) 100 MCG/2ML IJ SOLN: 25.0000 ug | Freq: Once | INTRAMUSCULAR | Status: DC

## 2018-09-25 MED ORDER — FENTANYL CITRATE (PF) 100 MCG/2ML IJ SOLN
50.0000 ug | Freq: Once | INTRAMUSCULAR | Status: AC
  Administered 2018-09-25: 50 ug via INTRAVENOUS
  Filled 2018-09-25: qty 2

## 2018-09-25 MED ORDER — FENTANYL CITRATE (PF) 100 MCG/2ML IJ SOLN
INTRAMUSCULAR | Status: AC | PRN
  Administered 2018-09-25 (×2): 25 ug via INTRAVENOUS

## 2018-09-25 MED ORDER — MIDAZOLAM HCL 2 MG/2ML IJ SOLN
INTRAMUSCULAR | Status: AC
  Filled 2018-09-25: qty 2

## 2018-09-25 MED ORDER — LIDOCAINE-EPINEPHRINE 1 %-1:100000 IJ SOLN
INTRAMUSCULAR | Status: AC
  Filled 2018-09-25: qty 1

## 2018-09-25 MED ORDER — GELATIN ABSORBABLE 12-7 MM EX MISC
CUTANEOUS | Status: AC
  Filled 2018-09-25: qty 1

## 2018-09-25 MED ORDER — FENTANYL CITRATE (PF) 100 MCG/2ML IJ SOLN
INTRAMUSCULAR | Status: AC
  Filled 2018-09-25: qty 2

## 2018-09-25 NOTE — Progress Notes (Addendum)
OT Cancellation Note  Patient Details Name: [name] MRN: [id number] DOB: [birth year]   Cancelled Treatment:    Reason Eval/Treat Not Completed: Patient at procedure or test/ unavailable(IR. Liver biopsy. Will return as schedule allows. Thank you)   Second attempt at 1202: Pt reporting she has increased pain and nausea post biopsy. Requesting to not get OOB at this time. Will return as schedule allows.   [place], OTR/L Acute Rehab Pager: [phone] Office: [phone] 09/25/2018, 8:46 AM

## 2018-09-25 NOTE — Progress Notes (Signed)
Progress Note    [name]  PIR:[id number] DOB: 1946/07/21  DOA: 09/21/2018 PCP: Nolene Ebbs, MD    Brief Narrative:   Chief complaint: Bleeding from rectum  Medical records reviewed and are as summarized below:  [name] is an 73 y.o. 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  She presented with complaints of bright red blood per rectum that have since resolved and GI have been working her up for liver masses.  Assessment/Plan:   Active Problems:   Anemia   Bright red blood per rectum   Gastrointestinal bleeding, internal hemorrhoids: Symptoms thought to be secondary to hemorrhoidal bleeding that resolved with Anusol suppository.  GI consult appreciated. -Continue to monitor  Acute blood loss anemia/iron deficiency anemia: Hemoglobin on admission noted to be as low as 7.1, but appeared to improve without need for blood transfusion.  Iron studies revealed iron 26, U IBC 89, TIBC 115, ferritin 160, and saturation radio 23. -Outpatient supplementation with ferrous sulfate  Liver masses: Acute.  Noted within the medial right hepatic lobe and patient with previous history of alcoholic liver disease/cirrhosis.  MRI multiple liver lesions identified provocatively involving the right lobe of the liver worrisome for metastatic disease.  Primary neoplasm such as cholangiocarcinoma not excluded and/or possibility of renal cell carcinoma.  Patient noted to previous we have elevated ACE levels in 03/2016 on care everywhere records. -Follow-up alpha-fetoprotein -Appreciate IR consultative services -Follow-up liver biopsies -Continue oxycodone and added fentanyl IV as needed moderate to severe pain   Leukocytosis: Acute.  WBC trending upward at 15.1.  Patient remains afebrile.  -Continue to monitor  Essential hypertension: Blood pressures improving.  Patient was normally on  furosemide, lisinopril, and metoprolol.  Held lisinopril due to intermittent hyperkalemia and reportedly had not been on in the last 3 weeks.  Furosemide initially held due to initial kidney function. -Continue metoprolol   Peripheral vascular disease -Continue cilostazol    Chronic kidney disease stage III: Stable. Baseline creatinine noted to be around 1.5  -Continue to monitor  Diabetes mellitus type 2: Patient with no recent hemoglobin A1c on file.  Patient currently takes Lantus 10 units every morning along with Amaryl and Januvia. -Carb modified diet -Hold home oral hypoglycemic agents -Continue sliding scale insulin  Hyperlipidemia -Continue simvastatin  Patient reportedly lives at home alone and has home health.  There is question of patient can manage medications at home alone.  Physical therapy recommended skilled nursing facility.  Patient currently awaiting placement.  Body mass index is 23.25 kg/m.   Family Communication/Anticipated D/C date and plan/Code Status   DVT prophylaxis: SCD ordered. Code Status: Full Code.  Family Communication: No family present at bedside Disposition Plan: Awaiting skilled nursing facility placement and approval   Medical Consultants:    Gastroenterology: Dr. Laurence Spates  Interventional radiology: Dr. Sandi Mariscal   Anti-Infectives:    None  Subjective:   Patient reports that she is having significant abdominal pain following the biopsy that is sharp in nature and makes her feel as though she needs to throw up.  Objective:    Vitals:   09/25/18 0955 09/25/18 1000 09/25/18 1005 09/25/18 1221  BP: 139/72 (!) 157/95 (!) 145/84 (!) 146/75  Pulse: 90 97 92 85  Resp: 18 20 (!) 22   Temp:    98.1 F (36.7 C)  TempSrc:    Oral  SpO2:  100% 99% 98% 97%  Weight:      Height:        Intake/Output Summary (Last 24 hours) at 09/25/2018 1337 Last data filed at 09/24/2018 2218 Gross per 24 hour  Intake 243 ml  Output -  Net  243 ml   Filed Weights   09/21/18 1925  Weight: 54 kg    Exam: Constitutional: Early female who appears to be in acute discomfort currently dry heaving. Eyes: PERRL, lids and conjunctivae normal ENMT: Mucous membranes are moist. Posterior pharynx clear of any exudate or lesions.   Neck: normal, supple, no masses, no thyromegaly Respiratory: clear to auscultation bilaterally, no wheezing, no crackles. Normal respiratory effort. No accessory muscle use.  Cardiovascular: Regular rate and rhythm, no murmurs / rubs / gallops. No extremity edema. 2+ pedal pulses. No carotid bruits.  Abdomen: Abdominal tenderness noted near biopsy site.  Bowel sounds positive.  Musculoskeletal: no clubbing / cyanosis. No joint deformity upper and lower extremities. Good ROM, no contractures. Normal muscle tone.  Skin: no rashes, lesions, ulcers. No induration Neurologic: CN 2-12 grossly intact. Sensation intact, DTR normal. Strength 5/5 in all 4.  Psychiatric: Normal judgment and insight. Alert and oriented x 3. Normal mood.    Data Reviewed:   I have personally reviewed following labs and imaging studies:  Labs: Labs show the following:   Basic Metabolic Panel: Recent Labs  Lab 09/21/18 1509 09/22/18 0613 09/23/18 0619 09/24/18 0631 09/25/18 0709  NA 137 138 136 136 140  K 5.3* 4.8 5.6* 4.7 4.5  CL 104 107 107 106 109  CO2 24 26 21* 20* 22  GLUCOSE 202* 161* 161* 134* 112*  BUN 29* 25* 25* 22 22  CREATININE 1.79* 1.53* 1.53* 1.33* 1.51*  CALCIUM 8.8* 8.1* 8.1* 8.2* 8.9   GFR Estimated Creatinine Clearance: 23.5 mL/min (A) (by C-G formula based on SCr of 1.51 mg/dL (H)). Liver Function Tests: Recent Labs  Lab 09/21/18 1509 09/23/18 0619  AST 33 35  ALT 11 7  ALKPHOS 214* 183*  BILITOT 0.5 0.8  PROT 6.0* 4.8*  ALBUMIN 2.1* 1.5*   No results for input(s): LIPASE, AMYLASE in the last 168 hours. No results for input(s): AMMONIA in the last 168 hours. Coagulation profile Recent Labs    Lab 09/21/18 1534  INR 1.05    CBC: Recent Labs  Lab 09/21/18 1509  09/22/18 1111 09/22/18 1846 09/23/18 2113 09/24/18 0631 09/25/18 0709  WBC 15.4*   < > 13.5* 13.9* 14.7* 13.4* 15.1*  NEUTROABS 11.7*  --   --   --  11.5*  --   --   HGB 8.4*   < > 7.1* 7.7* 8.1* 8.3* 9.3*  HCT 28.5*   < > 23.2* 26.2* 27.7* 26.8* 30.4*  MCV 86.9   < > 85.9 87.3 86.6 85.9 86.6  PLT 421*   < > 335 364 403* 371 PLATELET CLUMPS NOTED ON SMEAR, UNABLE TO ESTIMATE   < > = values in this interval not displayed.   Cardiac Enzymes: No results for input(s): CKTOTAL, CKMB, CKMBINDEX, TROPONINI in the last 168 hours. BNP (last 3 results) No results for input(s): PROBNP in the last 8760 hours. CBG: Recent Labs  Lab 09/24/18 1100 09/24/18 1553 09/24/18 2132 09/25/18 0629 09/25/18 1115  GLUCAP 137* 198* 243* 91 133*   D-Dimer: No results for input(s): DDIMER in the last 72 hours. Hgb A1c: No results for input(s): HGBA1C in the last 72 hours. Lipid Profile: No results for input(s): CHOL,  HDL, LDLCALC, TRIG, CHOLHDL, LDLDIRECT in the last 72 hours. Thyroid function studies: No results for input(s): TSH, T4TOTAL, T3FREE, THYROIDAB in the last 72 hours.  Invalid input(s): FREET3 Anemia work up: No results for input(s): VITAMINB12, FOLATE, FERRITIN, TIBC, IRON, RETICCTPCT in the last 72 hours. Sepsis Labs: Recent Labs  Lab 09/22/18 1846 09/23/18 2113 09/24/18 0631 09/25/18 0709  WBC 13.9* 14.7* 13.4* 15.1*    Microbiology No results found for this or any previous visit (from the past 240 hour(s)).  Procedures and diagnostic studies:  Mr Liver W Wo Contrast  Result Date: 09/23/2018 CLINICAL DATA:  Evaluate abdominal mass. EXAM: MRI ABDOMEN WITHOUT AND WITH CONTRAST TECHNIQUE: Multiplanar multisequence MR imaging of the abdomen was performed both before and after the administration of intravenous contrast. CONTRAST:  5 cc Gadavist COMPARISON:  11/30/2017 FINDINGS: Lower chest: Small  bilateral pleural effusions are identified. Hepatobiliary: There are multiple lesions identified within the right lobe of liver. The dominant mass is in segment 8 measuring 7.9 by 5.4 by 5.8 cm. At least 12 additional smaller lesions are identified throughout the right lobe. For example, within segment 6 there is a lesion measuring 1.6 cm, image 50/19. There is moderate distension of the gallbladder. No gallstones identified. Mild dilatation of the intrahepatic ducts to the posterior dome of liver likely reflects occlusion by central liver mass. No common bile duct dilatation. Pancreas: Changes of chronic pancreatitis are identified. Again seen is abnormal dilatation of the dorsal pancreatic duct which measures up to 5.2 mm. Several cystic structures are identified within the tail of pancreas which measure up to 1.0 cm, similar to previous exam. Spleen:  Within normal limits in size and appearance. Adrenals/Urinary Tract: Normal appearance of the adrenal glands. Bilateral kidney cysts are again noted. No hydronephrosis. T1 hypointense and T2 isointense structure within the interpolar left kidney is identified. This measures 2.0 cm and there may be mild enhancement within this structure following the IV administration of contrast as evident by increased signal intensity units on the postcontrast images, image 45/21 hemorrhagic cyst is noted arising from the posterior cortex of the left kidney measuring 2.1 cm, image 49/17. Several additional hemorrhagic cysts are noted within bilateral inferior poles. Stomach/Bowel: Visualized portions within the abdomen are unremarkable. Vascular/Lymphatic: Aortic atherosclerosis. The main portal vein appears patent. 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 Enlarged porta hepatic node measures 1.2 cm, image 52/22. Prominent gastro patent ligament node measures 0.9 cm, image 34/19. Other:  A trace amount of perihepatic  ascites is noted. Musculoskeletal: No suspicious bone lesions identified. IMPRESSION: 1. Examination is positive for multiple liver lesions identified predominantly involving the right lobe of liver and worrisome for metastatic disease. Primary liver neoplasm such as cholangiocarcinoma is not excluded. Correlation with tissue sampling is advised. 2. Mild upper abdominal adenopathy. 3. Multiple Bosniak category 1 and 2 cysts are identified bilaterally. 4. Within the mid left kidney there is a more complex, indeterminate and possible enhancing kidney lesion measuring 2 cm. Small renal cell carcinoma can not be excluded. 5.  Aortic Atherosclerosis (ICD10-I70.0). Electronically Signed   By: Kerby Moors M.D.   On: 09/23/2018 15:34   US Biopsy (liver)  Result Date: 09/25/2018 INDICATION: No known primary, now with multiple liver lesions worrisome for metastatic disease. Please from ultrasound-guided biopsy for tissue diagnostic purposes. EXAM: ULTRASOUND GUIDED LIVER LESION BIOPSY COMPARISON:  Abdominal MRI-09/23/2018 MEDICATIONS: None ANESTHESIA/SEDATION: Fentanyl 50 mcg IV; Versed 1 mg  IV Total Moderate Sedation time:  11 Minutes. The patient's level of consciousness and vital signs were monitored continuously by radiology nursing throughout the procedure under my direct supervision. COMPLICATIONS: None immediate. PROCEDURE: Informed written consent was obtained from the patient after a discussion of the risks, benefits and alternatives to treatment. The patient understands and consents the procedure. A timeout was performed prior to the initiation of the procedure. Ultrasound scanning was performed of the right upper abdominal quadrant demonstrates multiple mixed echogenic lesions and masses scattered within the right lobe of the liver. Dominant slightly ill-defined and infiltrative appearing approximately 6.3 x 5.1 cm mass involving the central aspect of the right lobe of the liver (image 3), correlating with  the dominant liver lesion seen on preceding abdominal MRI image 30, series 19, was targeted for biopsy given lesion location and sonographic window. The procedure was planned. The right upper abdominal quadrant was prepped and draped in the usual sterile fashion. The overlying soft tissues were anesthetized with 1% lidocaine with epinephrine. A 17 gauge, 6.8 cm co-axial needle was advanced into a peripheral aspect of the lesion. This was followed by 5 core biopsies with an 18 gauge core device under direct ultrasound guidance. The coaxial needle tract was embolized with a small amount of Gel-Foam slurry and superficial hemostasis was obtained with manual compression. Post procedural scanning was negative for definitive area of hemorrhage or additional complication. A dressing was placed. The patient tolerated the procedure well without immediate post procedural complication. IMPRESSION: Technically successful ultrasound guided core needle biopsy of dominant ill-defined slightly infiltrative appearing mass within the caudal aspect the right lobe of the liver. Electronically Signed   By: Sandi Mariscal M.D.   On: 09/25/2018 10:34    Medications:   . diclofenac sodium  2 g Topical QID  . fentaNYL      . fentaNYL (SUBLIMAZE) injection  25 mcg Intravenous Once  . fentaNYL (SUBLIMAZE) injection  50 mcg Intravenous Once  . folic acid  1 mg Oral Daily  . gelatin adsorbable      . hydrocortisone  25 mg Rectal BID  . insulin aspart  0-5 Units Subcutaneous QHS  . insulin aspart  0-9 Units Subcutaneous TID WC  . lidocaine-EPINEPHrine      . magnesium oxide  400 mg Oral Daily  . metoprolol tartrate  50 mg Oral BID  . midazolam      . pantoprazole (PROTONIX) IV  40 mg Intravenous Q12H  . sodium chloride flush  3 mL Intravenous Q12H  . thiamine  100 mg Oral Daily   Continuous Infusions: . sodium chloride       LOS: 0 days   Rondell A Smith  [facility]   *Please refer to [email or web address], password [email or web address]  to get updated schedule on who will round on this patient, as hospitalists switch teams weekly. If 7PM-7AM, please contact night-coverage at [email or web address], password [email or web address] for any overnight needs.

## 2018-09-25 NOTE — Plan of Care (Signed)
  Problem: Clinical Measurements: Goal: Will remain free from infection Outcome: Progressing   Problem: Clinical Measurements: Goal: Ability to maintain clinical measurements within normal limits will improve Outcome: Progressing   Problem: Education: Goal: Knowledge of General Education information will improve Description Including pain rating scale, medication(s)/side effects and non-pharmacologic comfort measures Outcome: Progressing   Problem: Clinical Measurements: Goal: Will remain free from infection Outcome: Progressing   Problem: Clinical Measurements: Goal: Respiratory complications will improve Outcome: Progressing

## 2018-09-25 NOTE — Procedures (Signed)
Pre Procedure Dx: Liver lesion  Post Procedural Dx: Same  Technically successful US guided biopsy of indeterminate lesion within the right lobe of the liver.   EBL: None  No immediate complications.   Jay Arno Cullers, MD Pager #: [phone]    

## 2018-09-25 NOTE — Progress Notes (Signed)
Pt down for liver mass biopsey with [place]. Will check tomorrow.

## 2018-09-26 DIAGNOSIS — N183 Chronic kidney disease, stage 3 (moderate): Secondary | ICD-10-CM | POA: Diagnosis not present

## 2018-09-26 DIAGNOSIS — K648 Other hemorrhoids: Secondary | ICD-10-CM

## 2018-09-26 DIAGNOSIS — E118 Type 2 diabetes mellitus with unspecified complications: Secondary | ICD-10-CM

## 2018-09-26 DIAGNOSIS — D509 Iron deficiency anemia, unspecified: Secondary | ICD-10-CM | POA: Diagnosis not present

## 2018-09-26 DIAGNOSIS — Z794 Long term (current) use of insulin: Secondary | ICD-10-CM

## 2018-09-26 DIAGNOSIS — R160 Hepatomegaly, not elsewhere classified: Secondary | ICD-10-CM | POA: Diagnosis present

## 2018-09-26 DIAGNOSIS — D72829 Elevated white blood cell count, unspecified: Secondary | ICD-10-CM | POA: Diagnosis present

## 2018-09-26 LAB — AFP TUMOR MARKER: AFP, SERUM, TUMOR MARKER: 4.3 ng/mL (ref 0.0–8.3)

## 2018-09-26 LAB — GLUCOSE, CAPILLARY
GLUCOSE-CAPILLARY: 197 mg/dL — AB (ref 70–99)
Glucose-Capillary: 152 mg/dL — ABNORMAL HIGH (ref 70–99)

## 2018-09-26 MED ORDER — SIMVASTATIN 20 MG PO TABS
10.0000 mg | ORAL_TABLET | Freq: Every day | ORAL | Status: DC
  Administered 2018-09-26: 10 mg via ORAL
  Filled 2018-09-26: qty 1

## 2018-09-26 MED ORDER — HYDROCORTISONE ACETATE 25 MG RE SUPP: 25.0000 mg | Freq: Two times a day (BID) | RECTAL | 0 refills | Status: DC | PRN

## 2018-09-26 MED ORDER — METOPROLOL TARTRATE 50 MG PO TABS: 50.0000 mg | ORAL_TABLET | Freq: Two times a day (BID) | ORAL | 0 refills | Status: AC

## 2018-09-26 MED ORDER — CILOSTAZOL 50 MG PO TABS
50.0000 mg | ORAL_TABLET | Freq: Two times a day (BID) | ORAL | Status: DC
  Administered 2018-09-26: 50 mg via ORAL
  Filled 2018-09-26 (×2): qty 1

## 2018-09-26 MED ORDER — OXYCODONE HCL 5 MG PO TABS: 5.0000 mg | ORAL_TABLET | Freq: Four times a day (QID) | ORAL | 0 refills | Status: DC | PRN

## 2018-09-26 MED ORDER — LANTUS SOLOSTAR 100 UNIT/ML ~~LOC~~ SOPN: 10.0000 [IU] | PEN_INJECTOR | SUBCUTANEOUS | Status: DC

## 2018-09-26 NOTE — Progress Notes (Signed)
Pt discharge education and instructions completed. Pt discharge to [facility] and report called off to nurse Olivia Mackie at the facility. Pt awaiting on PTAR to be transported to facility. Pt IV removed and belongings packed in bag. Will continue to closely monitor till pt pick up. P. Angelica Pou RN

## 2018-09-26 NOTE — Clinical Social Work Placement (Signed)
   CLINICAL SOCIAL WORK PLACEMENT  NOTE  Date:  09/26/2018  Patient Details  Name: [name] MRN: [id number] Date of Birth: May 01, 1946  Clinical Social Work is seeking post-discharge placement for this patient at the [place] level of care (*CSW will initial, date and re-position this form in  chart as items are completed):      Patient/family provided with [place] Work Department's list of facilities offering this level of care within the geographic area requested by the patient (or if unable, by the patient's family).  Yes   Patient/family informed of their freedom to choose among providers that offer the needed level of care, that participate in Medicare, Medicaid or managed care program needed by the patient, have an available bed and are willing to accept the patient.      Patient/family informed of [facility]'s ownership interest in [facility] and [facility], as well as of the fact that they are under no obligation to receive care at these facilities.  PASRR submitted to EDS on       PASRR number received on       Existing PASRR number confirmed on 09/26/18     FL2 transmitted to all facilities in geographic area requested by pt/family on 09/26/18     FL2 transmitted to all facilities within larger geographic area on       Patient informed that his/her managed care company has contracts with or will negotiate with certain facilities, including the following:        Yes   Patient/family informed of bed offers received.  Patient chooses bed at [facility]     Physician recommends and patient chooses bed at      Patient to be transferred to [facility] on 09/26/18.  Patient to be transferred to facility by [facility]     Patient family notified on 09/26/18 of transfer.  Name of family member notified:  Pt alert and oriented     PHYSICIAN       Additional Comment:     _______________________________________________ Eileen Stanford, LCSW 09/26/2018, 1:19 PM

## 2018-09-26 NOTE — Progress Notes (Signed)
[facility] PROGRESS NOTE Subjective Patient had ultrasound-guided biopsy yesterday.  She is having some pain in her right side after the biopsy says it is no worse but not much better either.  Path is still pending  Objective: Vital signs in last 24 hours: Temp:  [97.5 F (36.4 C)-98.6 F (37 C)] 98.6 F (37 C) (01/29 0609) Pulse Rate:  [75-85] 80 (01/29 0609) Resp:  [16] 16 (01/29 0609) BP: (127-151)/(66-75) 127/71 (01/29 0609) SpO2:  [92 %-100 %] 92 % (01/29 0609) Last BM Date: 09/25/18  Intake/Output from previous day: 01/28 0701 - 01/29 0700 In: 270 [P.O.:270] Out: -  Intake/Output this shift: No intake/output data recorded.  PE: General--patient alert eating lunch  Lungs--clear Abdomen--soft nondistended with some tenderness over the right rib cage and right subcostal area  Lab Results: Recent Labs    09/23/18 2113 09/24/18 0631 09/25/18 0709  WBC 14.7* 13.4* 15.1*  HGB 8.1* 8.3* 9.3*  HCT 27.7* 26.8* 30.4*  PLT 403* 371 PLATELET CLUMPS NOTED ON SMEAR, UNABLE TO ESTIMATE   BMET Recent Labs    09/24/18 0631 09/25/18 0709  NA 136 140  K 4.7 4.5  CL 106 109  CO2 20* 22  CREATININE 1.33* 1.51*   LFT No results for input(s): PROT, AST, ALT, ALKPHOS, BILITOT, BILIDIR, IBILI in the last 72 hours. PT/INR No results for input(s): LABPROT, INR in the last 72 hours. PANCREAS No results for input(s): LIPASE in the last 72 hours.       Studies/Results: US Biopsy (liver)  Result Date: 09/25/2018 INDICATION: No known primary, now with multiple liver lesions worrisome for metastatic disease. Please from ultrasound-guided biopsy for tissue diagnostic purposes. EXAM: ULTRASOUND GUIDED LIVER LESION BIOPSY COMPARISON:  Abdominal MRI-09/23/2018 MEDICATIONS: None ANESTHESIA/SEDATION: Fentanyl 50 mcg IV; Versed 1 mg IV Total Moderate Sedation time:  11 Minutes. The patient's level of consciousness and vital signs were monitored continuously by radiology  nursing throughout the procedure under my direct supervision. COMPLICATIONS: None immediate. PROCEDURE: Informed written consent was obtained from the patient after a discussion of the risks, benefits and alternatives to treatment. The patient understands and consents the procedure. A timeout was performed prior to the initiation of the procedure. Ultrasound scanning was performed of the right upper abdominal quadrant demonstrates multiple mixed echogenic lesions and masses scattered within the right lobe of the liver. Dominant slightly ill-defined and infiltrative appearing approximately 6.3 x 5.1 cm mass involving the central aspect of the right lobe of the liver (image 3), correlating with the dominant liver lesion seen on preceding abdominal MRI image 30, series 19, was targeted for biopsy given lesion location and sonographic window. The procedure was planned. The right upper abdominal quadrant was prepped and draped in the usual sterile fashion. The overlying soft tissues were anesthetized with 1% lidocaine with epinephrine. A 17 gauge, 6.8 cm co-axial needle was advanced into a peripheral aspect of the lesion. This was followed by 5 core biopsies with an 18 gauge core device under direct ultrasound guidance. The coaxial needle tract was embolized with a small amount of Gel-Foam slurry and superficial hemostasis was obtained with manual compression. Post procedural scanning was negative for definitive area of hemorrhage or additional complication. A dressing was placed. The patient tolerated the procedure well without immediate post procedural complication. IMPRESSION: Technically successful ultrasound guided core needle biopsy of dominant ill-defined slightly infiltrative appearing mass within the caudal aspect the right lobe of the liver. Electronically Signed   By: Eldridge Abrahams.D.  On: 09/25/2018 10:34    Medications: I have reviewed the patient's current medications.  Assessment:   1.  Hepatic  masses.  Had biopsy yesterday and some discomfort but seems to be breathing okay hopefully this will resolve.  She is on pain medicine.  EGD and colonoscopy were negative this admission.  CT does show left renal mass.   Plan: We will wait for results of the biopsy.  We will continue to treat her with pain medications and hopefully the discomfort will resolve.  Will discuss with Dr. Tamala Julian.  Page put in.   Nancy Fetter 09/26/2018, 11:14 AM  This note was created using voice recognition software. Minor errors may Have occurred unintentionally.  Pager: [phone] If no answer or after hours call [phone]

## 2018-09-26 NOTE — Evaluation (Signed)
Occupational Therapy Evaluation Patient Details Name: [name] MRN: [id number] DOB: Oct 28, 1945 Today's Date: 09/26/2018    History of Present Illness Pt is a 73 y/o female with a PMH significant for COPD, DM, HTN. Pt presents with BRBPR and was admitted under observation for GI bleed.   Clinical Impression   PTA, pt was living alone and has an aide who comes 3 hours a day for assistance with dressing, bathing, and IADLs. Pt currently requiring Min A for UB ADLs, Mod-Max A for LB ADLs, and Min A for functional mobility with RW. Pt continues to report significant pain at her right flank (biopsy site). Pt also presenting with decreased cognition and required increased time and cues throughout. Pt would benefit from further acute OT to facilitate safe dc. Recommend dc to SNF for further OT to optimize safety, independence with ADLs, and return to PLOF.      Follow Up Recommendations  SNF;Supervision/Assistance - 24 hour    Equipment Recommendations  Other (comment)(Defer to next venue)    Recommendations for Other Services PT consult     Precautions / Restrictions Precautions Precautions: Fall Restrictions Weight Bearing Restrictions: No      Mobility Bed Mobility Overal bed mobility: Needs Assistance Bed Mobility: Rolling;Sidelying to Sit;Sit to Supine Rolling: Max assist Sidelying to sit: Max assist       General bed mobility comments: Rolling to left (away from painful side) and required Max A for rolling, to manage BLEs, and to elevate trunk.  Transfers Overall transfer level: Needs assistance Equipment used: Rolling walker (2 wheeled);None Transfers: Sit to/from Stand Sit to Stand: Min assist;From elevated surface         General transfer comment: Min A to power up into standing and then steady once in standing    Balance Overall balance assessment: Needs assistance Sitting-balance support: No upper extremity supported;Feet supported Sitting  balance-Leahy Scale: Poor Sitting balance - Comments: Unable to don her own socks   Standing balance support: Bilateral upper extremity supported;During functional activity Standing balance-Leahy Scale: Poor Standing balance comment: relies on UE support/external assist for balance                           ADL either performed or assessed with clinical judgement   ADL Overall ADL's : Needs assistance/impaired Eating/Feeding: Supervision/ safety;Set up;Sitting   Grooming: Min guard;Standing   Upper Body Bathing: Minimal assistance;Sitting   Lower Body Bathing: Moderate assistance;Sit to/from stand   Upper Body Dressing : Minimal assistance;Sitting   Lower Body Dressing: Maximal assistance;Sit to/from stand Lower Body Dressing Details (indicate cue type and reason): donning socks Toilet Transfer: Minimal assistance;Ambulation;RW(simulated to recliner)           Functional mobility during ADLs: Minimal assistance;Rolling walker General ADL Comments: Pt with decreased balance, strength, and activity tolerance. Pt limited by pain at right side (biopsy site) and reports general pain at BLEs/BUEs.     Vision Baseline Vision/History: Wears glasses Patient Visual Report: No change from baseline       Perception     Praxis      Pertinent Vitals/Pain Pain Assessment: Faces Faces Pain Scale: Hurts even more Pain Location: Right side at biopsy; BLEs; BUEs Pain Descriptors / Indicators: Constant;Discomfort;Grimacing Pain Intervention(s): Monitored during session;Limited activity within patient's tolerance;Repositioned     Hand Dominance Right   Extremity/Trunk Assessment Upper Extremity Assessment Upper Extremity Assessment: Generalized weakness   Lower Extremity Assessment Lower Extremity Assessment: Defer to  PT evaluation   Cervical / Trunk Assessment Cervical / Trunk Assessment: Other exceptions Cervical / Trunk Exceptions: Forward head posture with  rounded shoulders   Communication Communication Communication: No difficulties   Cognition Arousal/Alertness: Awake/alert Behavior During Therapy: WFL for tasks assessed/performed Overall Cognitive Status: Impaired/Different from baseline Area of Impairment: Following commands;Attention;Memory;Safety/judgement;Awareness;Problem solving                   Current Attention Level: Selective Memory: Decreased short-term memory Following Commands: Follows one step commands inconsistently;Follows one step commands with increased time Safety/Judgement: Decreased awareness of safety Awareness: Emergent Problem Solving: Slow processing;Difficulty sequencing;Requires verbal cues General Comments: Pt requiring increased time and cues throughout. Presenting with tangiental thoughts. Poor awareness. At end of session, set pt up for eating breakfast - as OT was leaving pt yells out to nutrional services (walking by) "where is my lunch?" Returned and explained to pt that this is breakfast time and she has her breakfast infront of her.   General Comments       Exercises     Shoulder Instructions      Home Living Family/patient expects to be discharged to:: Private residence Living Arrangements: Alone Available Help at Discharge: Personal care attendant(3 hours day ) Type of Home: Apartment Home Access: Level entry     Home Layout: One level     Bathroom Shower/Tub: Teacher, early years/pre: Standard Bathroom Accessibility: Yes   Home Equipment: Environmental consultant - 2 wheels;Walker - 4 wheels;Bedside commode;Cane - single point;Cane - quad;Tub bench   Additional Comments: Pt providing home information but with difficulty answering questions. Requiring increased cues and sometimes providing inconsistance information      Prior Functioning/Environment Level of Independence: Needs assistance  Gait / Transfers Assistance Needed: ambulates with cane and rollator outdoors ADL's /  Homemaking Assistance Needed: Aide assists with bathing/dressing and IADLs            OT Problem List: Decreased strength;Decreased range of motion;Decreased activity tolerance;Impaired balance (sitting and/or standing);Decreased safety awareness;Decreased knowledge of use of DME or AE;Decreased knowledge of precautions;Decreased cognition;Pain      OT Treatment/Interventions: Self-care/ADL training;Therapeutic exercise;Energy conservation;DME and/or AE instruction;Therapeutic activities;Patient/family education    OT Goals(Current goals can be found in the care plan section) Acute Rehab OT Goals Patient Stated Goal: "Stop this pain. I've never been this sore" OT Goal Formulation: With patient Time For Goal Achievement: 10/10/18 Potential to Achieve Goals: Good  OT Frequency: Min 2X/week   Barriers to D/C:            Co-evaluation              AM-PAC OT "6 Clicks" Daily Activity     Outcome Measure Help from another person eating meals?: None Help from another person taking care of personal grooming?: A Little Help from another person toileting, which includes using toliet, bedpan, or urinal?: A Little Help from another person bathing (including washing, rinsing, drying)?: A Lot Help from another person to put on and taking off regular upper body clothing?: A Little Help from another person to put on and taking off regular lower body clothing?: A Lot 6 Click Score: 17   End of Session Equipment Utilized During Treatment: Rolling walker;Gait belt Nurse Communication: Mobility status;Other (comment)(Places alarm bad; no alarm box in room)  Activity Tolerance: Patient tolerated treatment well;Patient limited by pain Patient left: in chair;with call bell/phone within reach;Other (comment)(No chair alarm box in room; notified RN)  OT Visit Diagnosis: Unsteadiness  on feet (R26.81);Other abnormalities of gait and mobility (R26.89);Muscle weakness (generalized) (M62.81);Other  symptoms and signs involving cognitive function;Pain Pain - Right/Left: Right Pain - part of body: (flank)                Time: 6606-3016 OT Time Calculation (min): 22 min Charges:  OT General Charges $OT Visit: 1 Visit OT Evaluation $OT Eval Moderate Complexity: [place], OTR/L Acute Rehab Pager: [phone] Office: [place] 09/26/2018, 10:16 AM

## 2018-09-26 NOTE — Discharge Summary (Addendum)
[name], is a 73 y.o. female  DOB 02-18-1946  MRN [id number].  Admission date:  09/21/2018  Admitting Physician  Lady Deutscher, MD  Discharge Date:  09/26/2018   Primary MD  Nolene Ebbs, MD  Recommendations for primary care physician for things to follow:   Follow-up liver biopsies performed in the hospital  Discharge Diagnosis  Principal Problem:   Internal hemorrhoid, bleeding Active Problems:   Anemia   Controlled diabetes mellitus type 2 with complications ([place])   PVD, LEIA/LIIA and RCIA pta 7/09- ABIs 0.68 and 0.69 Feb 2014   Essential hypertension   Chronic renal disease, stage III (HCC)   Liver masses   Leukocytosis      Past Medical History:  Diagnosis Date  . Acute venous embolism and thrombosis of deep vessels of proximal lower extremity ([place])   . Alcoholic cirrhosis ([place])   . Anemia   . Anxiety   . Arthritis   . Asthma   . Back pain   . Blood transfusion few years ago  . Cellulitis    bilateral lower extremities  . CHF (congestive heart failure) ([place]) 10/13   grade 1 diastolic dysfunction, Nl LVF  . Chronic diastolic congestive heart failure ([place])   . Chronic obstructive pulmonary disease (COPD) ([place])   . Colon polyps   . Depression   . Diabetes mellitus    insulin dep   . Gastroesophageal reflux   . GI bleeding 08/28/2018  . Glaucoma    left  . Gout   . Hepatitis    alcoholic hepatitis  . History of GI diverticular bleed   . Hypertension   . Leg swelling   . On home oxygen therapy    "2L just at night" (08/28/2018)  . Pneumonia    history of  . PVD (peripheral vascular disease) ([place]) 2009   bilat iliac stenting  . Reflux esophagitis   . Villous adenoma of colon 05/10/11    Past Surgical History:  Procedure Laterality Date  . ABDOMINAL HYSTERECTOMY  yrs ago  .  BIOPSY  08/31/2018   Procedure: BIOPSY;  Surgeon: [name], MD;  Location: [facility];  Service: Gastroenterology;;  . CATARACT EXTRACTION Right yrs ago  . COLONOSCOPY    . COLONOSCOPY WITH PROPOFOL N/A 08/06/2013   Procedure: COLONOSCOPY WITH PROPOFOL;  Surgeon: Lear Ng, MD;  Location: [facility];  Service: Endoscopy;  Laterality: N/A;  . COLONOSCOPY WITH PROPOFOL N/A 08/31/2018   Procedure: COLONOSCOPY WITH PROPOFOL;  Surgeon: [name], MD;  Location: [facility];  Service: Gastroenterology;  Laterality: N/A;  . ESOPHAGOGASTRODUODENOSCOPY (EGD) WITH PROPOFOL N/A 08/06/2013   Procedure: ESOPHAGOGASTRODUODENOSCOPY (EGD) WITH PROPOFOL;  Surgeon: Lear Ng, MD;  Location: [facility];  Service: Endoscopy;  Laterality: N/A;  . ESOPHAGOGASTRODUODENOSCOPY (EGD) WITH PROPOFOL N/A 08/30/2018   Procedure: ESOPHAGOGASTRODUODENOSCOPY (EGD) WITH PROPOFOL;  Surgeon: [name], MD;  Location: [facility];  Service: Gastroenterology;  Laterality: N/A;  . LAPAROSCOPIC ASSISSTED TOTAL COLECTOMY W/ J-POUCH  04/22/11  . VENTRAL HERNIA REPAIR  06/13/2012   Procedure: LAPAROSCOPIC VENTRAL HERNIA;  Surgeon: Adin Hector, MD;  Location: [facility];  Service: General;  Laterality: N/A;  Laparoscopic Assisted Ventral Hernia with Mesh       HPI  from the history and physical done on the day of admission:     [name] is a 73 y.o. 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  She has had multiple bloody stools this morning.  Her stools have been loose.  She noticed just this morning did not notice any blood in her stool yesterday.  Bowel movements are painless and she did not have any associated abdominal cramping.  She has no hematemesis  associated with this.  3 weeks ago the patient was admitted on December 31 with GI bleeding.  She had a colonoscopy which showed reticulosis of the sigmoid and descending colon internal and external hemorrhoids no other active bleeding and a normal EGD.  She did not have varices.  She denies any alcohol use currently.  She does have a history of alcohol use in the past but quit over 2 years ago.  She reports aspirin use but does not use any other blood thinner thinner and has stopped using her Naprosyn.  She is very difficult to get a history from as she is confused and states that I should talk to her CNA.  She denies any chest pain, shortness of breath, dizziness, lightheadedness syncope or presyncope.  ED Course: Hemoglobin 8.4 which is dropped 1.5 g since January 4, mild acute kidney injury with a creatinine of 1.79, slightly tachycardic with a heart rate of 96 otherwise unremarkable     Hospital Course:   Gastrointestinal bleeding, internal hemorrhoids: Symptoms thought to be secondary to hemorrhoidal bleeding that resolved with Anusol suppository.  GI consult appreciated.  Acute blood loss anemia/iron deficiency anemia: Hemoglobin on admission noted to be as low as 7.1, but appeared to improve without need for blood transfusion.  Iron studies revealed iron 26, U IBC 89, TIBC 115, ferritin 160, and saturation radio 23.  Last hemoglobin noted to be stable at 9.3 on 1/28.  Patient was recommended to start ferrous sulfate.  Liver masses: Acute.  Noted within the medial right hepatic lobe and patient with previous history of alcoholic liver disease/cirrhosis.  MRI multiple liver lesions identified provocatively involving the right lobe of the liver worrisome for metastatic disease.  Primary neoplasm such as cholangiocarcinoma not excluded and/or possibility of renal cell carcinoma.  Patient noted to previous we have elevated ACE levels in 03/2016 on care everywhere records may be linked with  sarcoidosis.  Patient underwent liver biopsy on 1/28 with interventional radiology.  Following procedure patient complaining of significant pain for which she was given oxycodone for pain relief.  She was otherwise hemodynamically stable.  Primary care provider will need to follow-up results of biopsy and arrange follow-up care.  Leukocytosis: Acute.  WBC trending upward at 15.1.  Patient remains afebrile.  No other source of infection noted.  Essential hypertension: Blood pressures improving.  Patient was normally on furosemide, lisinopril, and metoprolol.  Held lisinopril due to intermittent hyperkalemia and reportedly had not been on in the last 3 weeks. Furosemide initially held due to initial kidney function.  She was continued on metoprolol  during the hospital stay  Peripheral vascular disease: Patient with previous history of intervention.  Continue cilostazol    Chronic kidney disease stage III: Stable. Baseline creatinine noted to be around 1.5   Diabetes mellitus type 2: Patient with no recent hemoglobin A1c on file. Patient currently takes Lantus 10 units every morning along with Amaryl and Januvia.  While  inpatient was continued on sliding scale insulin.  Will discharge back on home regimen of insulin  Hyperlipidemia: Continued simvastatin.  Patient reportedly lives at home alone and has home health.  There is question of patient can manage medications at home alone.  Physical therapy recommended skilled nursing facility.  Patient currently awaiting placement.     Follow UP     Consults obtained: Gastroenterology-James Oletta Lamas, MD Interventional radiology-John Pascal Lux, MD   Discharge Condition: Fair  Diet and Activity recommendation: See Discharge Instructions below   Discharge Instructions    Diet - low sodium heart healthy   Complete by:  As directed    Discharge instructions   Complete by:  As directed    The cause of your rectal bleeding was thought to be  related with internal hemorrhoids.  We would like you to follow-up with your primary care provider in 1 to 2 weeks from discharge.  Your primary care provider will need to follow-up on the liver biopsy results.  Get CBC and CMP-  checked  by Primary MD or SNF MD in 5-7 days ( we routinely change or add medications that can affect your baseline labs and fluid status, therefore we recommend that you get the mentioned basic workup next visit with your PCP, your PCP may decide not to get them or add new tests based on their clinical decision)  Activity: As tolerated with Full fall precautions use walker/cane & assistance as needed  Disposition: [facility]  Diet: Heart healthy carb modified diet          Special Instructions: If you have smoked or chewed Tobacco  in the last 2 yrs please stop smoking, stop any regular Alcohol  and or any Recreational drug use.  On your next visit with your primary care physician please Get Medicines reviewed and adjusted.  Please request your Nolene Ebbs, MD to go over all Hospital Tests and Procedure/Radiological results at the follow up, please get all Hospital records sent to your Prim MD by signing hospital release before you go home.  If you experience worsening of your admission symptoms, develop shortness of breath, life threatening emergency, suicidal or homicidal thoughts you must seek medical attention immediately by calling 911 or calling your MD immediately  if symptoms less severe.  You Must read complete instructions/literature along with all the possible adverse reactions/side effects for all the Medicines you take and that have been prescribed to you. Take any new Medicines after you have completely understood and accpet all the possible adverse reactions/side effects.   Do not drive, operate heavy machinery, perform activities at heights, swimming or participation in water activities or provide baby sitting services if your were  admitted for syncope or siezures until you have seen by Primary MD or a Neurologist and advised to do so again.  Do not drive when taking Pain medications.  Do not take more than prescribed Pain, Sleep and Anxiety Medications   Please note  You were cared for by a hospitalist during your hospital stay. If you have any questions about your discharge medications or the care you received while you were  in the hospital after you are discharged, you can call the unit and asked to speak with the hospitalist on call if the hospitalist that took care of you is not available. Once you are discharged, your primary care physician will handle any further medical issues. Please note that NO REFILLS for any discharge medications will be authorized once you are discharged, as it is imperative that you return to your primary care physician (or establish a relationship with a primary care physician if you do not have one) for your aftercare needs so that they can reassess your need for medications and monitor your lab values.   Increase activity slowly   Complete by:  As directed         Discharge Medications     Allergies as of 09/26/2018      Reactions   Compazine [prochlorperazine Edisylate] Other (See Comments)   Altered mental status   Penicillins Hives   Has patient had a PCN reaction causing immediate rash, facial/tongue/throat swelling, SOB or lightheadedness with hypotension: Yes Has patient had a PCN reaction causing severe rash involving mucus membranes or skin necrosis: No Has patient had a PCN reaction that required hospitalization: No Has patient had a PCN reaction occurring within the last 10 years: No If all of the above answers are "NO", then may proceed with Cephalosporin use.   Phenergan [promethazine Hcl] Other (See Comments)   Altered mental status   Lipitor [atorvastatin] Swelling   Body swells, but doesn't affect breathing   Chocolate Diarrhea   Lactose Intolerance (gi) Diarrhea     Other Other (See Comments)   Nuts- Diverticulitis   Pork-derived Products Other (See Comments)   Causes GOUT      Medication List    STOP taking these medications   furosemide 40 MG tablet Commonly known as:  LASIX   lisinopril 5 MG tablet Commonly known as:  PRINIVIL,ZESTRIL   methocarbamol 500 MG tablet Commonly known as:  ROBAXIN   traMADol 50 MG tablet Commonly known as:  ULTRAM     TAKE these medications   acetaminophen 325 MG tablet Commonly known as:  TYLENOL Take 2 tablets (650 mg total) by mouth every 6 (six) hours as needed for mild pain, fever or headache (or Fever >/= 101). What changed:  Another medication with the same name was removed. Continue taking this medication, and follow the directions you see here.   albuterol 108 (90 Base) MCG/ACT inhaler Commonly known as:  PROVENTIL HFA;VENTOLIN HFA Inhale 2 puffs into the lungs every 6 (six) hours as needed. For shortness of breath   albuterol (2.5 MG/3ML) 0.083% nebulizer solution Commonly known as:  PROVENTIL Take 2.5 mg by nebulization every 6 (six) hours as needed for wheezing or shortness of breath.   allopurinol 100 MG tablet Commonly known as:  ZYLOPRIM Take 100 mg by mouth daily.   aspirin 81 MG EC tablet Take 1 tablet (81 mg total) by mouth daily with breakfast.   cilostazol 100 MG tablet Commonly known as:  PLETAL Take 50 mg by mouth 2 (two) times daily.   diclofenac sodium 1 % Gel Commonly known as:  VOLTAREN Apply 2 g topically 4 (four) times daily.   ergocalciferol 1.25 MG (50000 UT) capsule Commonly known as:  VITAMIN D2 Take 50,000 Units by mouth every Monday. On Monday   ferrous sulfate 325 (65 FE) MG tablet Take 325 mg by mouth daily with breakfast.   fluticasone 50 MCG/ACT nasal spray Commonly known as:  FLONASE Place 2 sprays into the nose daily as needed for allergies.   folic acid 1 MG tablet Commonly known as:  FOLVITE Take 1 tablet (1 mg total) by mouth daily.    glimepiride 4 MG tablet Commonly known as:  AMARYL Take 1 tablet (4 mg total) by mouth daily with breakfast.   hydrocortisone 25 MG suppository Commonly known as:  ANUSOL-HC Place 1 suppository (25 mg total) rectally 2 (two) times daily as needed for hemorrhoids or anal itching.   JANUVIA 100 MG tablet Generic drug:  sitaGLIPtin Take 100 mg by mouth daily.   LANTUS SOLOSTAR 100 UNIT/ML Solostar Pen Generic drug:  Insulin Glargine Inject 10 Units into the skin every morning.   magnesium oxide 400 MG tablet Commonly known as:  MAG-OX Take 400 mg by mouth daily.   metoprolol tartrate 50 MG tablet Commonly known as:  LOPRESSOR Take 1 tablet (50 mg total) by mouth 2 (two) times daily. What changed:    medication strength  how much to take  Another medication with the same name was removed. Continue taking this medication, and follow the directions you see here.   montelukast 10 MG tablet Commonly known as:  SINGULAIR Take 10 mg by mouth at bedtime.   omeprazole 40 MG capsule Commonly known as:  PRILOSEC Take 40 mg by mouth daily.   oxyCODONE 5 MG immediate release tablet Commonly known as:  Oxy IR/ROXICODONE Take 1-2 tablets (5-10 mg total) by mouth every 6 (six) hours as needed for moderate pain or severe pain.   OXYGEN Inhale 2 L into the lungs at bedtime. What changed:  Another medication with the same name was removed. Continue taking this medication, and follow the directions you see here.   simvastatin 10 MG tablet Commonly known as:  ZOCOR Take 10 mg by mouth daily.   thiamine 100 MG tablet Take 1 tablet (100 mg total) by mouth daily.   triamcinolone ointment 0.1 % Commonly known as:  KENALOG Apply 1 application topically at bedtime.       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 COMPARISON:  Head CT August 14, 2015 and brain MRI August 14, 2015 FINDINGS: Brain: There is mild age related volume loss. There is no intracranial mass, hemorrhage, extra-axial fluid collection, or midline shift. There is slight small vessel disease in the centra semiovale bilaterally. No acute infarct evident. Vascular: There is no hyperdense vessel. There is calcification in each carotid siphon region. Skull: Bony calvarium appears intact, although bones are osteoporotic. Sinuses/Orbits: There is mucosal thickening in several ethmoid air cells. Orbits appear symmetric bilaterally. Other: Mastoid air cells are clear. There is debris in each external auditory canal. IMPRESSION: Age related volume loss with slight periventricular small vessel disease. No mass or hemorrhage. No evident acute infarct. Foci of arterial vascular calcification noted. Mucosal thickening in several ethmoid air cells. Probable cerumen in each external auditory canal. Bones osteoporotic. Electronically Signed   By: Lowella Grip III M.D.   On: 08/28/2018 15:08   Mr Liver W BS Contrast  Result Date: 09/23/2018 CLINICAL DATA:  Evaluate abdominal mass. EXAM: MRI ABDOMEN WITHOUT AND WITH CONTRAST TECHNIQUE: Multiplanar multisequence MR imaging  of the abdomen was performed both before and after the administration of intravenous contrast. CONTRAST:  5 cc Gadavist COMPARISON:  11/30/2017 FINDINGS: Lower chest: Small bilateral pleural effusions are identified. Hepatobiliary: There are multiple lesions identified within the right lobe of liver. The dominant mass is in segment 8 measuring 7.9 by 5.4 by 5.8 cm. At least 12 additional smaller lesions are identified throughout the right lobe. For example, within segment 6 there is a lesion measuring 1.6 cm, image 50/19. There is moderate  distension of the gallbladder. No gallstones identified. Mild dilatation of the intrahepatic ducts to the posterior dome of liver likely reflects occlusion by central liver mass. No common bile duct dilatation. Pancreas: Changes of chronic pancreatitis are identified. Again seen is abnormal dilatation of the dorsal pancreatic duct which measures up to 5.2 mm. Several cystic structures are identified within the tail of pancreas which measure up to 1.0 cm, similar to previous exam. Spleen:  Within normal limits in size and appearance. Adrenals/Urinary Tract: Normal appearance of the adrenal glands. Bilateral kidney cysts are again noted. No hydronephrosis. T1 hypointense and T2 isointense structure within the interpolar left kidney is identified. This measures 2.0 cm and there may be mild enhancement within this structure following the IV administration of contrast as evident by increased signal intensity units on the postcontrast images, image 45/21 hemorrhagic cyst is noted arising from the posterior cortex of the left kidney measuring 2.1 cm, image 49/17. Several additional hemorrhagic cysts are noted within bilateral inferior poles. Stomach/Bowel: Visualized portions within the abdomen are unremarkable. Vascular/Lymphatic: Aortic atherosclerosis. The main portal vein appears patent. 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 Enlarged porta hepatic node measures 1.2 cm, image 52/22. Prominent gastro patent ligament node measures 0.9 cm, image 34/19. Other:  A trace amount of perihepatic ascites is noted. Musculoskeletal: No suspicious bone lesions identified. IMPRESSION: 1. Examination is positive for multiple liver lesions identified predominantly involving the right lobe of liver and worrisome for metastatic disease. Primary liver neoplasm such as cholangiocarcinoma is not excluded. Correlation with tissue sampling is advised. 2. Mild upper abdominal  adenopathy. 3. Multiple Bosniak category 1 and 2 cysts are identified bilaterally. 4. Within the mid left kidney there is a more complex, indeterminate and possible enhancing kidney lesion measuring 2 cm. Small renal cell carcinoma can not be excluded. 5.  Aortic Atherosclerosis (ICD10-I70.0). Electronically Signed   By: Kerby Moors M.D.   On: 09/23/2018 15:34   US Biopsy (liver)  Result Date: 09/25/2018 INDICATION: No known primary, now with multiple liver lesions worrisome for metastatic disease. Please from ultrasound-guided biopsy for tissue diagnostic purposes. EXAM: ULTRASOUND GUIDED LIVER LESION BIOPSY COMPARISON:  Abdominal MRI-09/23/2018 MEDICATIONS: None ANESTHESIA/SEDATION: Fentanyl 50 mcg IV; Versed 1 mg IV Total Moderate Sedation time:  11 Minutes. The patient's level of consciousness and vital signs were monitored continuously by radiology nursing throughout the procedure under my direct supervision. COMPLICATIONS: None immediate. PROCEDURE: Informed written consent was obtained from the patient after a discussion of the risks, benefits and alternatives to treatment. The patient understands and consents the procedure. A timeout was performed prior to the initiation of the procedure. Ultrasound scanning was performed of the right upper abdominal quadrant demonstrates multiple mixed echogenic lesions and masses scattered within the right lobe of the liver. Dominant slightly ill-defined and infiltrative appearing approximately 6.3 x 5.1 cm mass involving the central aspect of the right lobe of the liver (image  3), correlating with the dominant liver lesion seen on preceding abdominal MRI image 30, series 19, was targeted for biopsy given lesion location and sonographic window. The procedure was planned. The right upper abdominal quadrant was prepped and draped in the usual sterile fashion. The overlying soft tissues were anesthetized with 1% lidocaine with epinephrine. A 17 gauge, 6.8 cm co-axial  needle was advanced into a peripheral aspect of the lesion. This was followed by 5 core biopsies with an 18 gauge core device under direct ultrasound guidance. The coaxial needle tract was embolized with a small amount of Gel-Foam slurry and superficial hemostasis was obtained with manual compression. Post procedural scanning was negative for definitive area of hemorrhage or additional complication. A dressing was placed. The patient tolerated the procedure well without immediate post procedural complication. IMPRESSION: Technically successful ultrasound guided core needle biopsy of dominant ill-defined slightly infiltrative appearing mass within the caudal aspect the right lobe of the liver. Electronically Signed   By: Sandi Mariscal M.D.   On: 09/25/2018 10:34   Dg Chest Port 1 View  Result Date: 08/28/2018 CLINICAL DATA:  Bloody stool.  Hematemesis. EXAM: PORTABLE CHEST 1 VIEW COMPARISON:  08/20/2018. FINDINGS: Mediastinum and hilar structures normal. Stable coarse interstitial markings again noted. No acute infiltrate. No pleural effusion or pneumothorax. Degenerative changes and scoliosis thoracic spine. Diffuse osteopenia. IMPRESSION: Stable chronic change again noted.  No acute abnormality identified. Electronically Signed   By: Marcello Moores  Register   On: 08/28/2018 13:20    Micro Results   No results found for this or any previous visit (from the past 240 hour(s)).     Today   Subjective    Annaleia Pence today states that she still has pain where the biopsy was performed.   Objective   Blood pressure 112/66, pulse 76, temperature 98.5 F (36.9 C), temperature source Oral, resp. rate 18, height 5' (1.524 m), weight 54 kg, SpO2 99 %.   Intake/Output Summary (Last 24 hours) at 09/26/2018 1301 Last data filed at 09/26/2018 0600 Gross per 24 hour  Intake 270 ml  Output -  Net 270 ml    Exam  Constitutional: Elderly female who appears to be in some discomfort Eyes: PERRL, lids and  conjunctivae normal ENMT: Mucous membranes are moist. Posterior pharynx clear of any exudate or lesions.  Neck: normal, supple, no masses, no thyromegaly Respiratory: clear to auscultation bilaterally, no wheezing, no crackles. Normal respiratory effort. No accessory muscle use.  Cardiovascular: Regular rate and rhythm, no murmurs / rubs / gallops. No extremity edema. 2+ pedal pulses. No carotid bruits.  Abdomen: Moderate tenderness over liver biopsy spot, no significant distention noted or peritoneal signs. Bowel sounds positive.  Musculoskeletal: no clubbing / cyanosis. No joint deformity upper and lower extremities. Good ROM, no contractures. Normal muscle tone.  Skin: no rashes, lesions, ulcers. No induration Neurologic: CN 2-12 grossly intact. Sensation intact, DTR normal. Strength 5/5 in all 4.  Psychiatric: Normal judgment and insight. Alert and oriented x 3.  Anxious mood.    Data Review   CBC w Diff:  Lab Results  Component Value Date   WBC 15.1 (H) 09/25/2018   HGB 9.3 (L) 09/25/2018   HCT 30.4 (L) 09/25/2018   PLT PLATELET CLUMPS NOTED ON SMEAR, UNABLE TO ESTIMATE 09/25/2018   LYMPHOPCT 12 09/23/2018   MONOPCT 9 09/23/2018   EOSPCT 1 09/23/2018   BASOPCT 0 09/23/2018    CMP:  Lab Results  Component Value Date   NA 140 09/25/2018  K 4.5 09/25/2018   CL 109 09/25/2018   CO2 22 09/25/2018   BUN 22 09/25/2018   CREATININE 1.51 (H) 09/25/2018   CREATININE 0.94 12/09/2014   PROT 4.8 (L) 09/23/2018   ALBUMIN 1.5 (L) 09/23/2018   BILITOT 0.8 09/23/2018   ALKPHOS 183 (H) 09/23/2018   AST 35 09/23/2018   ALT 7 09/23/2018  .   Total Time in preparing paper work, data evaluation and todays exam - 35 minutes  Norval Morton M.D on 09/26/2018 at [facility]   Office  [phone]

## 2018-09-26 NOTE — Progress Notes (Signed)
Pt picked up by [facility] to be transported off to disposition. Pt transported off unit via stretcher with belongings to the side. Delia Heady RN

## 2018-09-26 NOTE — Clinical Social Work Note (Signed)
Clinical Social Worker facilitated patient discharge including contacting patient family and facility to confirm patient discharge plans.  Clinical information faxed to facility and family agreeable with plan.  CSW arranged ambulance transport via [facility] to [facility] room 127B .  RN to call [phone] for report prior to discharge.  Clinical Social Worker will sign off for now as social work intervention is no longer needed. Please consult [place] again if new need arises.  [place], [place]

## 2018-10-03 ENCOUNTER — Telehealth: Payer: Self-pay

## 2018-10-03 NOTE — Telephone Encounter (Signed)
Per chart, pt cancelled scheduled appt for 09/24/2018 with Almyra Deforest. Contacted pt to reschedule appt. No answer and unable to leave message. Phone continues to ring. Will send letter advising pt to set up appt

## 2018-10-05 ENCOUNTER — Telehealth: Payer: Self-pay | Admitting: Cardiovascular Disease

## 2018-10-05 NOTE — Telephone Encounter (Signed)
Spoke with amy, she needs a new order for the pro-bnp. Verbal order given for pro-bnp.

## 2018-10-05 NOTE — Telephone Encounter (Signed)
New Message        [name] is calling from [facility] to let Dr. Gwenlyn Found know that [employer] has messed up a order that she order. [name] sent in another order that was similar to the one that was messed up and she needs a signature from Dr. Gwenlyn Found. Pls call when available. [phone]

## 2018-10-05 NOTE — Telephone Encounter (Signed)
Lm to call back ./cy 

## 2018-11-05 ENCOUNTER — Telehealth: Payer: Self-pay | Admitting: Cardiovascular Disease

## 2018-11-05 NOTE — Telephone Encounter (Signed)
Returned call to [employer] with [facility].Stated patient needs a post hospital appointment.Stated she saw patient today and she is weak.She had to cancel her last appointment due to being admitted to hospital.Stated she has to have appointment after 3:00 pm.Apppointment scheduled with Jory Sims DNP 11/26/18 at 3:30 pm.

## 2018-11-05 NOTE — Telephone Encounter (Signed)
Spoke with pt

## 2018-11-05 NOTE — Telephone Encounter (Signed)
New Message   Pt c/o swelling: STAT is pt has developed SOB within 24 hours  1) How much weight have you gained and in what time span? states she lost weight    2) If swelling, where is the swelling located? foot  3) Are you currently taking a fluid pill? Lasix   4) Are you currently SOB? No  5) Do you have a log of your daily weights (if so, list)? 115.8, 118.4, 112.2  6) Have you gained 3 pounds in a day or 5 pounds in a week? She gained then lost weight  7) Have you traveled recently? NO

## 2018-11-07 ENCOUNTER — Other Ambulatory Visit: Payer: Self-pay | Admitting: Internal Medicine

## 2018-11-07 DIAGNOSIS — Z1231 Encounter for screening mammogram for malignant neoplasm of breast: Secondary | ICD-10-CM

## 2018-11-12 ENCOUNTER — Emergency Department (HOSPITAL_COMMUNITY): Payer: Medicare Other

## 2018-11-12 ENCOUNTER — Encounter (HOSPITAL_COMMUNITY): Payer: Self-pay | Admitting: *Deleted

## 2018-11-12 ENCOUNTER — Inpatient Hospital Stay (HOSPITAL_COMMUNITY)
Admission: EM | Admit: 2018-11-12 | Discharge: 2018-11-19 | DRG: 194 | Disposition: A | Discharge status: Skilled Nursing Facility | Payer: Medicare Other | Attending: Internal Medicine | Admitting: Internal Medicine

## 2018-11-12 DIAGNOSIS — R531 Weakness: Secondary | ICD-10-CM

## 2018-11-12 DIAGNOSIS — J189 Pneumonia, unspecified organism: Principal | ICD-10-CM | POA: Diagnosis present

## 2018-11-12 DIAGNOSIS — J9611 Chronic respiratory failure with hypoxia: Secondary | ICD-10-CM | POA: Diagnosis present

## 2018-11-12 DIAGNOSIS — N179 Acute kidney failure, unspecified: Secondary | ICD-10-CM | POA: Diagnosis present

## 2018-11-12 DIAGNOSIS — E785 Hyperlipidemia, unspecified: Secondary | ICD-10-CM | POA: Diagnosis present

## 2018-11-12 DIAGNOSIS — K7030 Alcoholic cirrhosis of liver without ascites: Secondary | ICD-10-CM | POA: Diagnosis present

## 2018-11-12 DIAGNOSIS — M109 Gout, unspecified: Secondary | ICD-10-CM | POA: Diagnosis present

## 2018-11-12 DIAGNOSIS — K210 Gastro-esophageal reflux disease with esophagitis: Secondary | ICD-10-CM | POA: Diagnosis present

## 2018-11-12 DIAGNOSIS — Z86718 Personal history of other venous thrombosis and embolism: Secondary | ICD-10-CM

## 2018-11-12 DIAGNOSIS — N183 Chronic kidney disease, stage 3 (moderate): Secondary | ICD-10-CM | POA: Diagnosis present

## 2018-11-12 DIAGNOSIS — J441 Chronic obstructive pulmonary disease with (acute) exacerbation: Secondary | ICD-10-CM | POA: Diagnosis not present

## 2018-11-12 DIAGNOSIS — E1129 Type 2 diabetes mellitus with other diabetic kidney complication: Secondary | ICD-10-CM | POA: Diagnosis present

## 2018-11-12 DIAGNOSIS — F17210 Nicotine dependence, cigarettes, uncomplicated: Secondary | ICD-10-CM | POA: Diagnosis present

## 2018-11-12 DIAGNOSIS — Z7982 Long term (current) use of aspirin: Secondary | ICD-10-CM

## 2018-11-12 DIAGNOSIS — Z90710 Acquired absence of both cervix and uterus: Secondary | ICD-10-CM

## 2018-11-12 DIAGNOSIS — E1151 Type 2 diabetes mellitus with diabetic peripheral angiopathy without gangrene: Secondary | ICD-10-CM | POA: Diagnosis present

## 2018-11-12 DIAGNOSIS — Z800 Family history of malignant neoplasm of digestive organs: Secondary | ICD-10-CM

## 2018-11-12 DIAGNOSIS — E11649 Type 2 diabetes mellitus with hypoglycemia without coma: Secondary | ICD-10-CM | POA: Diagnosis present

## 2018-11-12 DIAGNOSIS — Z720 Tobacco use: Secondary | ICD-10-CM

## 2018-11-12 DIAGNOSIS — Z9981 Dependence on supplemental oxygen: Secondary | ICD-10-CM

## 2018-11-12 DIAGNOSIS — F329 Major depressive disorder, single episode, unspecified: Secondary | ICD-10-CM | POA: Diagnosis present

## 2018-11-12 DIAGNOSIS — E860 Dehydration: Secondary | ICD-10-CM | POA: Diagnosis present

## 2018-11-12 DIAGNOSIS — E1122 Type 2 diabetes mellitus with diabetic chronic kidney disease: Secondary | ICD-10-CM | POA: Diagnosis present

## 2018-11-12 DIAGNOSIS — Z79899 Other long term (current) drug therapy: Secondary | ICD-10-CM

## 2018-11-12 DIAGNOSIS — D649 Anemia, unspecified: Secondary | ICD-10-CM | POA: Diagnosis present

## 2018-11-12 DIAGNOSIS — E440 Moderate protein-calorie malnutrition: Secondary | ICD-10-CM | POA: Diagnosis present

## 2018-11-12 DIAGNOSIS — H409 Unspecified glaucoma: Secondary | ICD-10-CM | POA: Diagnosis present

## 2018-11-12 DIAGNOSIS — J440 Chronic obstructive pulmonary disease with acute lower respiratory infection: Secondary | ICD-10-CM | POA: Diagnosis present

## 2018-11-12 DIAGNOSIS — K7010 Alcoholic hepatitis without ascites: Secondary | ICD-10-CM | POA: Diagnosis present

## 2018-11-12 DIAGNOSIS — Z7951 Long term (current) use of inhaled steroids: Secondary | ICD-10-CM

## 2018-11-12 DIAGNOSIS — I5032 Chronic diastolic (congestive) heart failure: Secondary | ICD-10-CM | POA: Diagnosis present

## 2018-11-12 DIAGNOSIS — E1165 Type 2 diabetes mellitus with hyperglycemia: Secondary | ICD-10-CM | POA: Diagnosis present

## 2018-11-12 DIAGNOSIS — Z79891 Long term (current) use of opiate analgesic: Secondary | ICD-10-CM

## 2018-11-12 DIAGNOSIS — I130 Hypertensive heart and chronic kidney disease with heart failure and stage 1 through stage 4 chronic kidney disease, or unspecified chronic kidney disease: Secondary | ICD-10-CM | POA: Diagnosis present

## 2018-11-12 DIAGNOSIS — R0789 Other chest pain: Secondary | ICD-10-CM | POA: Diagnosis present

## 2018-11-12 DIAGNOSIS — K219 Gastro-esophageal reflux disease without esophagitis: Secondary | ICD-10-CM | POA: Diagnosis present

## 2018-11-12 DIAGNOSIS — K7460 Unspecified cirrhosis of liver: Secondary | ICD-10-CM | POA: Diagnosis present

## 2018-11-12 DIAGNOSIS — F419 Anxiety disorder, unspecified: Secondary | ICD-10-CM | POA: Diagnosis present

## 2018-11-12 DIAGNOSIS — M549 Dorsalgia, unspecified: Secondary | ICD-10-CM | POA: Diagnosis present

## 2018-11-12 LAB — COMPREHENSIVE METABOLIC PANEL
ALT: 11 U/L (ref 0–44)
ANION GAP: 6 (ref 5–15)
AST: 26 U/L (ref 15–41)
Albumin: 2.1 g/dL — ABNORMAL LOW (ref 3.5–5.0)
Alkaline Phosphatase: 281 U/L — ABNORMAL HIGH (ref 38–126)
BUN: 50 mg/dL — ABNORMAL HIGH (ref 8–23)
CO2: 22 mmol/L (ref 22–32)
Calcium: 8.4 mg/dL — ABNORMAL LOW (ref 8.9–10.3)
Chloride: 106 mmol/L (ref 98–111)
Creatinine, Ser: 2.58 mg/dL — ABNORMAL HIGH (ref 0.44–1.00)
GFR calc Af Amer: 20 mL/min — ABNORMAL LOW (ref >60)
GFR calc non Af Amer: 18 mL/min — ABNORMAL LOW (ref >60)
GLUCOSE: 293 mg/dL — AB (ref 70–99)
Potassium: 4.5 mmol/L (ref 3.5–5.1)
Sodium: 134 mmol/L — ABNORMAL LOW (ref 135–145)
Total Bilirubin: 0.2 mg/dL — ABNORMAL LOW (ref 0.3–1.2)
Total Protein: 5.8 g/dL — ABNORMAL LOW (ref 6.5–8.1)

## 2018-11-12 LAB — CBC
HCT: 28.1 % — ABNORMAL LOW (ref 36.0–46.0)
Hemoglobin: 8.3 g/dL — ABNORMAL LOW (ref 12.0–15.0)
MCH: 25.2 pg — ABNORMAL LOW (ref 26.0–34.0)
MCHC: 29.5 g/dL — ABNORMAL LOW (ref 30.0–36.0)
MCV: 85.4 fL (ref 80.0–100.0)
PLATELETS: 418 10*3/uL — AB (ref 150–400)
RBC: 3.29 MIL/uL — ABNORMAL LOW (ref 3.87–5.11)
RDW: 15.8 % — ABNORMAL HIGH (ref 11.5–15.5)
WBC: 14.2 10*3/uL — ABNORMAL HIGH (ref 4.0–10.5)
nRBC: 0 % (ref 0.0–0.2)

## 2018-11-12 LAB — SAMPLE TO BLOOD BANK

## 2018-11-12 LAB — URINALYSIS, ROUTINE W REFLEX MICROSCOPIC
Bilirubin Urine: NEGATIVE
Glucose, UA: NEGATIVE mg/dL
Hgb urine dipstick: NEGATIVE
Ketones, ur: NEGATIVE mg/dL
Leukocytes,Ua: NEGATIVE
Nitrite: NEGATIVE
Protein, ur: NEGATIVE mg/dL
Specific Gravity, Urine: 1.006 (ref 1.005–1.030)
pH: 6 (ref 5.0–8.0)

## 2018-11-12 MED ORDER — SODIUM CHLORIDE 0.9 % IV SOLN
1.0000 g | INTRAVENOUS | Status: DC
  Administered 2018-11-13 – 2018-11-14 (×2): 1 g via INTRAVENOUS
  Filled 2018-11-12 (×3): qty 10

## 2018-11-12 MED ORDER — FUROSEMIDE 20 MG PO TABS: 40.0000 mg | ORAL_TABLET | Freq: Every day | ORAL | Status: DC

## 2018-11-12 MED ORDER — ASPIRIN EC 81 MG PO TBEC
81.0000 mg | DELAYED_RELEASE_TABLET | Freq: Every day | ORAL | Status: DC
  Administered 2018-11-13 – 2018-11-19 (×7): 81 mg via ORAL
  Filled 2018-11-12 (×7): qty 1

## 2018-11-12 MED ORDER — NICOTINE 21 MG/24HR TD PT24
21.0000 mg | MEDICATED_PATCH | Freq: Every day | TRANSDERMAL | Status: DC
  Administered 2018-11-13 – 2018-11-19 (×7): 21 mg via TRANSDERMAL
  Filled 2018-11-12 (×7): qty 1

## 2018-11-12 MED ORDER — VITAMIN B-1 100 MG PO TABS
100.0000 mg | ORAL_TABLET | Freq: Every day | ORAL | Status: DC
  Administered 2018-11-13 – 2018-11-19 (×7): 100 mg via ORAL
  Filled 2018-11-12 (×7): qty 1

## 2018-11-12 MED ORDER — ALBUTEROL SULFATE (2.5 MG/3ML) 0.083% IN NEBU: 2.5000 mg | INHALATION_SOLUTION | RESPIRATORY_TRACT | Status: DC | PRN

## 2018-11-12 MED ORDER — OXYCODONE HCL 5 MG PO TABS: 5.0000 mg | ORAL_TABLET | Freq: Four times a day (QID) | ORAL | Status: DC | PRN

## 2018-11-12 MED ORDER — INSULIN GLARGINE 100 UNIT/ML ~~LOC~~ SOLN
5.0000 [IU] | Freq: Every day | SUBCUTANEOUS | Status: DC
  Administered 2018-11-13 (×2): 5 [IU] via SUBCUTANEOUS
  Filled 2018-11-12 (×2): qty 0.05

## 2018-11-12 MED ORDER — DM-GUAIFENESIN ER 30-600 MG PO TB12: 1.0000 | ORAL_TABLET | Freq: Two times a day (BID) | ORAL | Status: DC | PRN

## 2018-11-12 MED ORDER — INSULIN ASPART 100 UNIT/ML ~~LOC~~ SOLN
0.0000 [IU] | Freq: Three times a day (TID) | SUBCUTANEOUS | Status: DC
  Administered 2018-11-13: 3 [IU] via SUBCUTANEOUS

## 2018-11-12 MED ORDER — CILOSTAZOL 50 MG PO TABS
50.0000 mg | ORAL_TABLET | Freq: Two times a day (BID) | ORAL | Status: DC
  Administered 2018-11-13 – 2018-11-19 (×14): 50 mg via ORAL
  Filled 2018-11-12 (×15): qty 1

## 2018-11-12 MED ORDER — SIMVASTATIN 20 MG PO TABS
10.0000 mg | ORAL_TABLET | Freq: Every day | ORAL | Status: DC
  Administered 2018-11-13 – 2018-11-18 (×5): 10 mg via ORAL
  Filled 2018-11-12 (×6): qty 1

## 2018-11-12 MED ORDER — MAGNESIUM OXIDE 400 (241.3 MG) MG PO TABS
400.0000 mg | ORAL_TABLET | Freq: Every day | ORAL | Status: DC
  Administered 2018-11-13 – 2018-11-19 (×7): 400 mg via ORAL
  Filled 2018-11-12 (×7): qty 1

## 2018-11-12 MED ORDER — SODIUM CHLORIDE 0.9 % IV SOLN
1000.0000 mL | INTRAVENOUS | Status: DC
  Administered 2018-11-13 – 2018-11-14 (×3): 1000 mL via INTRAVENOUS

## 2018-11-12 MED ORDER — SODIUM CHLORIDE 0.9 % IV SOLN
1.0000 g | Freq: Once | INTRAVENOUS | Status: AC
  Administered 2018-11-12: 1 g via INTRAVENOUS
  Filled 2018-11-12: qty 10

## 2018-11-12 MED ORDER — AZITHROMYCIN 250 MG PO TABS
500.0000 mg | ORAL_TABLET | Freq: Once | ORAL | Status: AC
  Administered 2018-11-12: 500 mg via ORAL
  Filled 2018-11-12: qty 2

## 2018-11-12 MED ORDER — SODIUM CHLORIDE 0.9 % IV BOLUS
500.0000 mL | Freq: Once | INTRAVENOUS | Status: AC
  Administered 2018-11-12: 500 mL via INTRAVENOUS

## 2018-11-12 MED ORDER — METOPROLOL TARTRATE 25 MG PO TABS
25.0000 mg | ORAL_TABLET | Freq: Two times a day (BID) | ORAL | Status: DC
  Administered 2018-11-13 – 2018-11-19 (×14): 25 mg via ORAL
  Filled 2018-11-12 (×14): qty 1

## 2018-11-12 MED ORDER — ALLOPURINOL 100 MG PO TABS
100.0000 mg | ORAL_TABLET | Freq: Every day | ORAL | Status: DC
  Administered 2018-11-13 – 2018-11-19 (×7): 100 mg via ORAL
  Filled 2018-11-12 (×7): qty 1

## 2018-11-12 MED ORDER — FERROUS SULFATE 325 (65 FE) MG PO TABS
325.0000 mg | ORAL_TABLET | Freq: Every day | ORAL | Status: DC
  Administered 2018-11-13 – 2018-11-19 (×7): 325 mg via ORAL
  Filled 2018-11-12 (×7): qty 1

## 2018-11-12 MED ORDER — PANTOPRAZOLE SODIUM 40 MG PO TBEC
40.0000 mg | DELAYED_RELEASE_TABLET | Freq: Every day | ORAL | Status: DC
  Administered 2018-11-13 – 2018-11-19 (×7): 40 mg via ORAL
  Filled 2018-11-12 (×7): qty 1

## 2018-11-12 MED ORDER — SODIUM CHLORIDE 0.9 % IV BOLUS (SEPSIS)
500.0000 mL | Freq: Once | INTRAVENOUS | Status: AC
  Administered 2018-11-12: 500 mL via INTRAVENOUS

## 2018-11-12 MED ORDER — SODIUM CHLORIDE 0.9 % IV SOLN
1000.0000 mL | INTRAVENOUS | Status: DC
  Administered 2018-11-12: 1000 mL via INTRAVENOUS

## 2018-11-12 MED ORDER — SODIUM CHLORIDE 0.9 % IV SOLN
500.0000 mg | INTRAVENOUS | Status: DC
  Administered 2018-11-13: 500 mg via INTRAVENOUS
  Filled 2018-11-12 (×2): qty 500

## 2018-11-12 MED ORDER — IPRATROPIUM-ALBUTEROL 0.5-2.5 (3) MG/3ML IN SOLN
3.0000 mL | Freq: Four times a day (QID) | RESPIRATORY_TRACT | Status: DC
  Administered 2018-11-13 (×2): 3 mL via RESPIRATORY_TRACT
  Filled 2018-11-12 (×2): qty 3

## 2018-11-12 MED ORDER — MONTELUKAST SODIUM 10 MG PO TABS
10.0000 mg | ORAL_TABLET | Freq: Every day | ORAL | Status: DC
  Administered 2018-11-13 – 2018-11-18 (×7): 10 mg via ORAL
  Filled 2018-11-12 (×7): qty 1

## 2018-11-12 MED ORDER — NITROGLYCERIN 0.4 MG SL SUBL
0.4000 mg | SUBLINGUAL_TABLET | SUBLINGUAL | Status: DC | PRN
  Filled 2018-11-12: qty 1

## 2018-11-12 MED ORDER — FOLIC ACID 1 MG PO TABS
1.0000 mg | ORAL_TABLET | Freq: Every day | ORAL | Status: DC
  Administered 2018-11-13 – 2018-11-19 (×7): 1 mg via ORAL
  Filled 2018-11-12 (×7): qty 1

## 2018-11-12 NOTE — ED Notes (Signed)
Per 2W pt ok to be transported to floor.

## 2018-11-12 NOTE — ED Provider Notes (Signed)
[facility] EMERGENCY DEPARTMENT Provider Note   CSN: [id number] Arrival date & time: 11/12/18  1545    History   Chief Complaint Chief Complaint  Patient presents with  . Weakness    HPI [name] is a 73 y.o. female.  HPI Patient presents to the emergency room for evaluation of weakness.  Patient was admitted to the hospital back in January of this year for GI bleed.  Patient has assistance at home with home health caregivers.  Over the last year or so the patient has had increasing generalized weakness.  She has not been able to get up and around as much.  Some outpatient lab tests were done recently and apparently her white blood cell count was elevated as well as her BUN and creatinine.  Patient admits to having a good appetite.  She has been coughing some.  She denies any known fevers.  She denies any chest pain or abdominal pain.  No blood in her stool.  She has noticed dark stools but she is on iron.  No dysuria.   Past Medical History:  Diagnosis Date  . Acute venous embolism and thrombosis of deep vessels of proximal lower extremity ([place])   . Alcoholic cirrhosis ([place])   . Anemia   . Anxiety   . Arthritis   . Asthma   . Back pain   . Blood transfusion few years ago  . Cellulitis    bilateral lower extremities  . CHF (congestive heart failure) ([place]) 10/13   grade 1 diastolic dysfunction, Nl LVF  . Chronic diastolic congestive heart failure ([place])   . Chronic obstructive pulmonary disease (COPD) ([place])   . Colon polyps   . Depression   . Diabetes mellitus    insulin dep   . Gastroesophageal reflux   . GI bleeding 08/28/2018  . Glaucoma    left  . Gout   . Hepatitis    alcoholic hepatitis  . History of GI diverticular bleed   . Hypertension   . Leg swelling   . On home oxygen therapy    "2L just at night" (08/28/2018)  . Pneumonia    history of  . PVD (peripheral vascular disease) ([place]) 2009   bilat iliac stenting  . Reflux  esophagitis   . Villous adenoma of colon 05/10/11    Patient Active Problem List   Diagnosis Date Noted  . Internal hemorrhoid, bleeding 09/26/2018  . Liver masses 09/26/2018  . Leukocytosis 09/26/2018  . AKI (acute kidney injury) ([place]) 08/28/2018  . Gout 08/10/2015  . Hypoglycemia   . Hypothermia   . UTI (lower urinary tract infection)   . Humeral head fracture 06/05/2015  . Idiopathic chronic gout of multiple sites without tophus 06/05/2015  . Chronic renal disease, stage III ([place]) 06/05/2015  . Nausea vomiting and diarrhea 05/09/2015  . Increased urinary frequency 05/09/2015  . Sepsis ([place]) 05/09/2015  . Hypokalemia   . Chronic diastolic congestive heart failure ([place])   . Hyperlipidemia 02/10/2015  . Cellulitis 11/04/2014  . Essential hypertension 08/27/2014  . Abdominal pain, acute, bilateral lower quadrant 07/26/2014  . Colitis 07/26/2014  . GI bleeding 06/14/2014  . Bright red blood per rectum 06/14/2014  . Acute encephalopathy 06/14/2014  . Personal history of colonic polyps 08/06/2013  . Knee pain, acute 05/20/2013  . Claudication ([place]) 03/13/2013  . Obesity 03/13/2013  . PVD, LEIA/LIIA and RCIA pta 7/09- ABIs 0.68 and 0.69 Feb 2014 11/17/2012  . PUD (peptic ulcer disease)  GI bleed in past (2011) 11/17/2012  . Cirrhosis of liver ([place]) 11/17/2012  . Colon cancer, lap chole 2012 11/17/2012  . 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 Atypical chest pain- no history of CAD. Myoview low risk 2012 11/15/2012  . Controlled diabetes mellitus type 2 with complications ([place]) 97/98/9211  . Acute renal failure superimposed on stage 3 chronic kidney disease ([place]) 06/16/2012  . Incisional hernia 01/09/2012  . Anemia 01/28/2011  . COPD (chronic obstructive pulmonary disease) ([place]) 01/28/2011  . Sleep apnea 01/28/2011  . Transfusion history 01/28/2011  . Sinus problem 01/28/2011  . Full dentures 01/28/2011    Past Surgical History:  Procedure  Laterality Date  . ABDOMINAL HYSTERECTOMY  yrs ago  . BIOPSY  08/31/2018   Procedure: BIOPSY;  Surgeon: [name], MD;  Location: [facility];  Service: Gastroenterology;;  . CATARACT EXTRACTION Right yrs ago  . COLONOSCOPY    . COLONOSCOPY WITH PROPOFOL N/A 08/06/2013   Procedure: COLONOSCOPY WITH PROPOFOL;  Surgeon: Lear Ng, MD;  Location: [facility];  Service: Endoscopy;  Laterality: N/A;  . COLONOSCOPY WITH PROPOFOL N/A 08/31/2018   Procedure: COLONOSCOPY WITH PROPOFOL;  Surgeon: [name], MD;  Location: [facility];  Service: Gastroenterology;  Laterality: N/A;  . ESOPHAGOGASTRODUODENOSCOPY (EGD) WITH PROPOFOL N/A 08/06/2013   Procedure: ESOPHAGOGASTRODUODENOSCOPY (EGD) WITH PROPOFOL;  Surgeon: Lear Ng, MD;  Location: [facility];  Service: Endoscopy;  Laterality: N/A;  . ESOPHAGOGASTRODUODENOSCOPY (EGD) WITH PROPOFOL N/A 08/30/2018   Procedure: ESOPHAGOGASTRODUODENOSCOPY (EGD) WITH PROPOFOL;  Surgeon: [name], MD;  Location: [facility];  Service: Gastroenterology;  Laterality: N/A;  . LAPAROSCOPIC ASSISSTED TOTAL COLECTOMY W/ J-POUCH  04/22/11  . VENTRAL HERNIA REPAIR  06/13/2012   Procedure: LAPAROSCOPIC VENTRAL HERNIA;  Surgeon: Adin Hector, MD;  Location: [facility];  Service: General;  Laterality: N/A;  Laparoscopic Assisted Ventral Hernia with Mesh     OB History   No obstetric history on file.      Home Medications    Prior to Admission medications   Medication Sig Start Date End Date Taking? Authorizing Provider  acetaminophen (TYLENOL) 325 MG tablet Take 2 tablets (650 mg total) by mouth every 6 (six) hours as needed for mild pain, fever or headache (or Fever >/= 101). 09/01/18  Yes Emokpae, Courage, MD  albuterol (PROVENTIL HFA;VENTOLIN HFA) 108 (90 BASE) MCG/ACT inhaler Inhale 2 puffs into the lungs every 6 (six) hours as needed. For shortness of breath   Yes [provider]  albuterol (PROVENTIL) (2.5 MG/3ML) 0.083% nebulizer  solution Take 2.5 mg by nebulization every 6 (six) hours as needed for wheezing or shortness of breath.   Yes [provider]  allopurinol (ZYLOPRIM) 100 MG tablet Take 100 mg by mouth daily.  01/18/16   [provider]  aspirin 81 MG EC tablet Take 1 tablet (81 mg total) by mouth daily with breakfast. 09/01/18   Roxan Hockey, MD  cilostazol (PLETAL) 100 MG tablet Take 50 mg by mouth 2 (two) times daily. 08/07/18   [provider]  diclofenac sodium (VOLTAREN) 1 % GEL Apply 2 g topically 4 (four) times daily.  05/21/18   [provider]  ergocalciferol (VITAMIN D2) 50000 UNITS capsule Take 50,000 Units by mouth every Monday. On Monday    [provider]  ferrous sulfate 325 (65 FE) MG tablet Take 325 mg by mouth daily with breakfast.    [provider]  fluticasone (FLONASE) 50 MCG/ACT nasal spray Place 2 sprays  into the nose daily as needed for allergies.     [provider]  folic acid (FOLVITE) 1 MG tablet Take 1 tablet (1 mg total) by mouth daily. Patient not taking: Reported on 09/22/2018 08/18/15   Modena Jansky, MD  glimepiride (AMARYL) 4 MG tablet Take 1 tablet (4 mg total) by mouth daily with breakfast. 09/01/18   Roxan Hockey, MD  hydrocortisone (ANUSOL-HC) 25 MG suppository Place 1 suppository (25 mg total) rectally 2 (two) times daily as needed for hemorrhoids or anal itching. 09/26/18   Norval Morton, MD  LANTUS SOLOSTAR 100 UNIT/ML Solostar Pen Inject 10 Units into the skin every morning. 09/26/18   Norval Morton, MD  magnesium oxide (MAG-OX) 400 MG tablet Take 400 mg by mouth daily.    [provider]  metoprolol tartrate (LOPRESSOR) 50 MG tablet Take 1 tablet (50 mg total) by mouth 2 (two) times daily. 09/26/18   Fuller Plan A, MD  montelukast (SINGULAIR) 10 MG tablet Take 10 mg by mouth at bedtime.     [provider]  omeprazole (PRILOSEC) 40 MG capsule Take 40 mg by mouth daily.      [provider]  oxyCODONE (OXY IR/ROXICODONE) 5 MG immediate release tablet Take 1-2 tablets (5-10 mg total) by mouth every 6 (six) hours as needed for moderate pain or severe pain. 09/26/18   Norval Morton, MD  OXYGEN Inhale 2 L into the lungs at bedtime.    [provider]  simvastatin (ZOCOR) 10 MG tablet Take 10 mg by mouth daily.  11/30/14   [provider]  sitaGLIPtin (JANUVIA) 100 MG tablet Take 100 mg by mouth daily.  03/27/14   [provider]  thiamine 100 MG tablet Take 1 tablet (100 mg total) by mouth daily. Patient not taking: Reported on 09/22/2018 08/18/15   Modena Jansky, MD  triamcinolone ointment (KENALOG) 0.1 % Apply 1 application topically at bedtime.  05/21/18   [provider]    Family History Family History  Problem Relation Age of Onset  . Cancer Brother        heent ca  . Cancer Sister        liver ca    Social History Social History   Tobacco Use  . Smoking status: Current Some Day Smoker    Packs/day: 0.25    Years: 30.00    Pack years: 7.50    Types: Cigarettes    Last attempt to quit: 11/06/2005    Years since quitting: 13.0  . Smokeless tobacco: Never Used  Substance Use Topics  . Alcohol use: No    Comment: stopped 2005  . Drug use: No     Allergies   Compazine [prochlorperazine edisylate]; Penicillins; Phenergan [promethazine hcl]; Lipitor [atorvastatin]; Chocolate; Lactose intolerance (gi); Other; and Pork-derived products   Review of Systems Review of Systems  Constitutional: Negative for fever.  Respiratory: Positive for cough.   Cardiovascular: Negative for chest pain.  Gastrointestinal: Negative for abdominal pain and blood in stool.  Neurological: Positive for weakness.  Psychiatric/Behavioral:       Occasionally hearing voices, ?related to her pain meds per caregiver  All other systems reviewed and are negative.    Physical Exam Updated Vital Signs BP (!) 159/71   Pulse 73    Temp 98.6 F (37 C) (Rectal)   Resp 19   SpO2 99%   Physical Exam Vitals signs and nursing note reviewed.  Constitutional:  General: She is not in acute distress.    Appearance: She is well-developed.  HENT:     Head: Normocephalic and atraumatic.     Right Ear: External ear normal.     Left Ear: External ear normal.  Eyes:     General: No scleral icterus.       Right eye: No discharge.        Left eye: No discharge.     Conjunctiva/sclera: Conjunctivae normal.  Neck:     Musculoskeletal: Neck supple.     Trachea: No tracheal deviation.  Cardiovascular:     Rate and Rhythm: Normal rate and regular rhythm.  Pulmonary:     Effort: Pulmonary effort is normal. No respiratory distress.     Breath sounds: Normal breath sounds. No stridor. No wheezing or rales.  Abdominal:     General: Bowel sounds are normal. There is no distension.     Palpations: Abdomen is soft.     Tenderness: There is no abdominal tenderness. There is no guarding or rebound.  Musculoskeletal:        General: No tenderness.  Skin:    General: Skin is warm and dry.     Findings: No rash.  Neurological:     Mental Status: She is alert.     Cranial Nerves: No cranial nerve deficit (no facial droop, extraocular movements intact, no slurred speech).     Sensory: No sensory deficit.     Motor: Weakness present. No abnormal muscle tone or seizure activity.     Coordination: Coordination normal.     Comments: Patient is able to move all extremities but her movements are very slow, takes her several seconds to lift either her legs or her arms off the bed      ED Treatments / Results  Labs (all labs ordered are listed, but only abnormal results are displayed) Labs Reviewed  CBC - Abnormal; Notable for the following components:      Result Value   WBC 14.2 (*)    RBC 3.29 (*)    Hemoglobin 8.3 (*)    HCT 28.1 (*)    MCH 25.2 (*)    MCHC 29.5 (*)    RDW 15.8 (*)    Platelets 418 (*)    All other  components within normal limits  COMPREHENSIVE METABOLIC PANEL - Abnormal; Notable for the following components:   Sodium 134 (*)    Glucose, Bld 293 (*)    BUN 50 (*)    Creatinine, Ser 2.58 (*)    Calcium 8.4 (*)    Total Protein 5.8 (*)    Albumin 2.1 (*)    Alkaline Phosphatase 281 (*)    Total Bilirubin 0.2 (*)    GFR calc non Af Amer 18 (*)    GFR calc Af Amer 20 (*)    All other components within normal limits  URINALYSIS, ROUTINE W REFLEX MICROSCOPIC  SAMPLE TO BLOOD BANK    EKG None  Radiology Dg Chest 2 View  Result Date: 11/12/2018 CLINICAL DATA:  Weakness. EXAM: CHEST - 2 VIEW COMPARISON:  08/28/2018 FINDINGS: Lungs are hyperexpanded. Interstitial markings are diffusely coarsened with chronic features. Hazy opacity noted in the right apex. No pleural effusion or evidence of pulmonary edema. The cardiopericardial silhouette is within normal limits for size. Bones are diffusely demineralized. Telemetry leads overlie the chest. Multiple thoracic compression fractures evident. IMPRESSION: 1. Hyperexpansion with underlying chronic interstitial changes. 2. Hazy opacity in the right lung apex, indeterminate  but pneumonia not excluded. Follow-up recommended to ensure resolution. Electronically Signed   By: Misty Stanley M.D.   On: 11/12/2018 17:14   Ct Head Wo Contrast  Result Date: 11/12/2018 CLINICAL DATA:  Altered mental status EXAM: CT HEAD WITHOUT CONTRAST TECHNIQUE: Contiguous axial images were obtained from the base of the skull through the vertex without intravenous contrast. Sagittal and coronal MPR images reconstructed from axial data set. COMPARISON:  08/28/2018 FINDINGS: Brain: Normal ventricular morphology. No midline shift or mass effect. Mild small vessel chronic ischemic changes of deep cerebral white matter. No intracranial hemorrhage, mass lesion or evidence acute infarction. No extra-axial fluid collections. Vascular: Atherosclerotic calcification of internal  carotid and vertebral arteries at skull base Skull: Demineralized but intact Sinuses/Orbits: Deformity and chronic opacification of RIGHT maxillary sinus likely sequela of remote trauma. Remaining paranasal sinuses and mastoid air cells clear. Other: N/A IMPRESSION: Atrophy with mild small vessel chronic ischemic changes of deep cerebral white matter. No acute intracranial abnormalities. Electronically Signed   By: Lavonia Dana M.D.   On: 11/12/2018 18:24    Procedures Procedures (including critical care time)  Medications Ordered in ED Medications  sodium chloride 0.9 % bolus 500 mL (500 mLs Intravenous New Bag/Given 11/12/18 1806)    Followed by  0.9 %  sodium chloride infusion (has no administration in time range)  sodium chloride 0.9 % bolus 500 mL (has no administration in time range)  cefTRIAXone (ROCEPHIN) 1 g in sodium chloride 0.9 % 100 mL IVPB (has no administration in time range)  azithromycin (ZITHROMAX) tablet 500 mg (has no administration in time range)     Initial Impression / Assessment and Plan / ED Course  I have reviewed the triage vital signs and the nursing notes.  Pertinent labs & imaging results that were available during my care of the patient were reviewed by me and considered in my medical decision making (see chart for details).  Clinical Course as of Nov 12 2022  Mon Nov 12, 2018  1935 Chest x-ray shows possible pneumonia.  Patient's white blood cell count is elevated and she does have evidence of an cute kidney injury.   [JK]  1935 Prior labs reviewed.  Patient tolerated ceftriaxone before.   [JK]    Clinical Course User Index [JK] Dorie Rank, MD     Patient presented to the emergency room for increasing weakness.  Patient has had a cough recently.  Her chest x-ray does show the possibility of pneumonia.  Laboratory tests are notable for leukocytosis and acute kidney injury.  SHe remains normotensive and afebrile.  She does not appear septic.  Plan  admission to the hospital for IV antibiotics and further treatment.  Final Clinical Impressions(s) / ED Diagnoses   Final diagnoses:  Dehydration  Community acquired pneumonia, unspecified laterality  AKI (acute kidney injury) ([place])      Dorie Rank, MD 11/12/18 2025

## 2018-11-12 NOTE — ED Triage Notes (Addendum)
Pt here from home after her home healthcare RN told her that the labs drawn on 3/5 were abnormal.  She told her to come to ED d/t chest pain and increased weakness over 2 weeks.  Pt normally able to ambulate with cane or walker, but home health aid states that since Fri she has just wanted to lay in bed.  Pt ao x 4.  CBG 425, BP 154/100, HR 76, o2 SATS 98% ra.  Pt states some chest pain and dizziness today, but it just "comes and goes when it wants to". Denies either, presently.  Pt takes hydrocodone for chronic arthritis pain.

## 2018-11-12 NOTE — ED Notes (Signed)
2W notified Handoff is ready for review

## 2018-11-12 NOTE — ED Notes (Signed)
ED TO INPATIENT HANDOFF REPORT  ED Nurse Name and Phone #: Sagrario Lineberry,RN [phone]  S Name/Age/Gender [name] 73 y.o. female Room/Bed: 032C/032C  Code Status   Code Status: Full Code  Home/SNF/Other Home Patient oriented to: self, place, time and situation Is this baseline? Yes   Triage Complete: Triage complete  Chief Complaint Gen Weakness; Hyperglycemia  Triage Note Pt here from home after her home healthcare RN told her that the labs drawn on 3/5 were abnormal.  She told her to come to ED d/t chest pain and increased weakness over 2 weeks.  Pt normally able to ambulate with cane or walker, but home health aid states that since Fri she has just wanted to lay in bed.  Pt ao x 4.  CBG 425, BP 154/100, HR 76, o2 SATS 98% ra.  Pt states some chest pain and dizziness today, but it just "comes and goes when it wants to". Denies either, presently.  Pt takes hydrocodone for chronic arthritis pain.   Allergies Allergies  Allergen Reactions  . Compazine [Prochlorperazine Edisylate] Other (See Comments)    Altered mental status  . Penicillins Hives    Has patient had a PCN reaction causing immediate rash, facial/tongue/throat swelling, SOB or lightheadedness with hypotension: Yes Has patient had a PCN reaction causing severe rash involving mucus membranes or skin necrosis: No Has patient had a PCN reaction that required hospitalization: No Has patient had a PCN reaction occurring within the last 10 years: No If all of the above answers are "NO", then may proceed with Cephalosporin use.   Marland Kitchen Phenergan [Promethazine Hcl] Other (See Comments)    Altered mental status  . Lipitor [Atorvastatin] Swelling    Body swells, but doesn't affect breathing  . Chocolate Diarrhea  . Lactose Intolerance (Gi) Diarrhea  . Other Other (See Comments)    Nuts- Diverticulitis  . Pork-Derived Products Other (See Comments)    Causes GOUT    Level of Care/Admitting Diagnosis ED Disposition     ED Disposition Condition Comment   Admit  Hospital Area: [facility] [[id number]]  Level of Care: Telemetry Cardiac [103]  I expect the patient will be discharged within 24 hours: No (not a candidate for 5C-Observation unit)  Diagnosis: COPD exacerbation [facility]) [[id number]]  Admitting Physician: Ivor Costa [[id number]]  Attending Physician: Ivor Costa [[id number]]  PT Class (Do Not Modify): Observation [104]  PT Acc Code (Do Not Modify): Observation [10022]       B Medical/Surgery History Past Medical History:  Diagnosis Date  . Acute venous embolism and thrombosis of deep vessels of proximal lower extremity ([address])   . Alcoholic cirrhosis ([place])   . Anemia   . Anxiety   . Arthritis   . Asthma   . Back pain   . Blood transfusion few years ago  . Cellulitis    bilateral lower extremities  . CHF (congestive heart failure) ([place]) 10/13   grade 1 diastolic dysfunction, Nl LVF  . Chronic diastolic congestive heart failure ([place])   . Chronic obstructive pulmonary disease (COPD) ([place])   . Colon polyps   . Depression   . Diabetes mellitus    insulin dep   . Gastroesophageal reflux   . GI bleeding 08/28/2018  . Glaucoma    left  . Gout   . Hepatitis    alcoholic hepatitis  . History of GI diverticular bleed   . Hypertension   . Leg swelling   . On home oxygen therapy    "  2L just at night" (08/28/2018)  . Pneumonia    history of  . PVD (peripheral vascular disease) ([place]) 2009   bilat iliac stenting  . Reflux esophagitis   . Villous adenoma of colon 05/10/11   Past Surgical History:  Procedure Laterality Date  . ABDOMINAL HYSTERECTOMY  yrs ago  . BIOPSY  08/31/2018   Procedure: BIOPSY;  Surgeon: [name], MD;  Location: [facility];  Service: Gastroenterology;;  . CATARACT EXTRACTION Right yrs ago  . COLONOSCOPY    . COLONOSCOPY WITH PROPOFOL N/A 08/06/2013   Procedure: COLONOSCOPY WITH PROPOFOL;  Surgeon: Lear Ng, MD;  Location: [facility];  Service:  Endoscopy;  Laterality: N/A;  . COLONOSCOPY WITH PROPOFOL N/A 08/31/2018   Procedure: COLONOSCOPY WITH PROPOFOL;  Surgeon: [name], MD;  Location: [facility];  Service: Gastroenterology;  Laterality: N/A;  . ESOPHAGOGASTRODUODENOSCOPY (EGD) WITH PROPOFOL N/A 08/06/2013   Procedure: ESOPHAGOGASTRODUODENOSCOPY (EGD) WITH PROPOFOL;  Surgeon: Lear Ng, MD;  Location: [facility];  Service: Endoscopy;  Laterality: N/A;  . ESOPHAGOGASTRODUODENOSCOPY (EGD) WITH PROPOFOL N/A 08/30/2018   Procedure: ESOPHAGOGASTRODUODENOSCOPY (EGD) WITH PROPOFOL;  Surgeon: [name], MD;  Location: [facility];  Service: Gastroenterology;  Laterality: N/A;  . LAPAROSCOPIC ASSISSTED TOTAL COLECTOMY W/ J-POUCH  04/22/11  . VENTRAL HERNIA REPAIR  06/13/2012   Procedure: LAPAROSCOPIC VENTRAL HERNIA;  Surgeon: Adin Hector, MD;  Location: [facility];  Service: General;  Laterality: N/A;  Laparoscopic Assisted Ventral Hernia with Mesh     A IV Location/Drains/Wounds Patient Lines/Drains/Airways Status   Active Line/Drains/Airways    Name:   Placement date:   Placement time:   Site:   Days:   Peripheral IV 11/12/18 Right Wrist   11/12/18    1600    Wrist   less than 1   Incision (Closed) 09/25/18 Abdomen Right;Upper   09/25/18    1006     48          Intake/Output Last 24 hours  Intake/Output Summary (Last 24 hours) at 11/12/2018 2240 Last data filed at 11/12/2018 2142 Gross per 24 hour  Intake 1000 ml  Output -  Net 1000 ml    Labs/Imaging Results for orders placed or performed during the hospital encounter of 11/12/18 (from the past 48 hour(s))  CBC     Status: Abnormal   Collection Time: 11/12/18  5:19 PM  Result Value Ref Range   WBC 14.2 (H) 4.0 - 10.5 K/uL   RBC 3.29 (L) 3.87 - 5.11 MIL/uL   Hemoglobin 8.3 (L) 12.0 - 15.0 g/dL   HCT 28.1 (L) 36.0 - 46.0 %   MCV 85.4 80.0 - 100.0 fL   MCH 25.2 (L) 26.0 - 34.0 pg   MCHC 29.5 (L) 30.0 - 36.0 g/dL   RDW 15.8 (H) 11.5 - 15.5 %   Platelets  418 (H) 150 - 400 K/uL   nRBC 0.0 0.0 - 0.2 %    Comment: Performed at [facility], [address] [place], [place] [address]  Sample to Blood Bank     Status: None   Collection Time: 11/12/18  5:19 PM  Result Value Ref Range   Blood Bank Specimen SAMPLE AVAILABLE FOR TESTING    Sample Expiration      11/13/2018 Performed at [facility], [place] [address], [place], [place] [address]   Comprehensive metabolic panel     Status: Abnormal   Collection Time: 11/12/18  5:19 PM  Result Value Ref Range  Sodium 134 (L) 135 - 145 mmol/L   Potassium 4.5 3.5 - 5.1 mmol/L   Chloride 106 98 - 111 mmol/L   CO2 22 22 - 32 mmol/L   Glucose, Bld 293 (H) 70 - 99 mg/dL   BUN 50 (H) 8 - 23 mg/dL   Creatinine, Ser 2.58 (H) 0.44 - 1.00 mg/dL   Calcium 8.4 (L) 8.9 - 10.3 mg/dL   Total Protein 5.8 (L) 6.5 - 8.1 g/dL   Albumin 2.1 (L) 3.5 - 5.0 g/dL   AST 26 15 - 41 U/L   ALT 11 0 - 44 U/L   Alkaline Phosphatase 281 (H) 38 - 126 U/L   Total Bilirubin 0.2 (L) 0.3 - 1.2 mg/dL   GFR calc non Af Amer 18 (L) >60 mL/min   GFR calc Af Amer 20 (L) >60 mL/min   Anion gap 6 5 - 15    Comment: Performed at [facility], [place] [address], [place], [place] [address]  Urinalysis, Routine w reflex microscopic     Status: Abnormal   Collection Time: 11/12/18  7:50 PM  Result Value Ref Range   Color, Urine STRAW (A) YELLOW   APPearance CLEAR CLEAR   Specific Gravity, Urine 1.006 1.005 - 1.030   pH 6.0 5.0 - 8.0   Glucose, UA NEGATIVE NEGATIVE mg/dL   Hgb urine dipstick NEGATIVE NEGATIVE   Bilirubin Urine NEGATIVE NEGATIVE   Ketones, ur NEGATIVE NEGATIVE mg/dL   Protein, ur NEGATIVE NEGATIVE mg/dL   Nitrite NEGATIVE NEGATIVE   Leukocytes,Ua NEGATIVE NEGATIVE    Comment: Performed at [facility] [address] [place], [place] [address]   Dg Chest 2 View  Result Date: 11/12/2018 CLINICAL DATA:  Weakness. EXAM: CHEST - 2 VIEW COMPARISON:  08/28/2018 FINDINGS: Lungs are hyperexpanded.  Interstitial markings are diffusely coarsened with chronic features. Hazy opacity noted in the right apex. No pleural effusion or evidence of pulmonary edema. The cardiopericardial silhouette is within normal limits for size. Bones are diffusely demineralized. Telemetry leads overlie the chest. Multiple thoracic compression fractures evident. IMPRESSION: 1. Hyperexpansion with underlying chronic interstitial changes. 2. Hazy opacity in the right lung apex, indeterminate but pneumonia not excluded. Follow-up recommended to ensure resolution. Electronically Signed   By: Misty Stanley M.D.   On: 11/12/2018 17:14   Ct Head Wo Contrast  Result Date: 11/12/2018 CLINICAL DATA:  Altered mental status EXAM: CT HEAD WITHOUT CONTRAST TECHNIQUE: Contiguous axial images were obtained from the base of the skull through the vertex without intravenous contrast. Sagittal and coronal MPR images reconstructed from axial data set. COMPARISON:  08/28/2018 FINDINGS: Brain: Normal ventricular morphology. No midline shift or mass effect. Mild small vessel chronic ischemic changes of deep cerebral white matter. No intracranial hemorrhage, mass lesion or evidence acute infarction. No extra-axial fluid collections. Vascular: Atherosclerotic calcification of internal carotid and vertebral arteries at skull base Skull: Demineralized but intact Sinuses/Orbits: Deformity and chronic opacification of RIGHT maxillary sinus likely sequela of remote trauma. Remaining paranasal sinuses and mastoid air cells clear. Other: N/A IMPRESSION: Atrophy with mild small vessel chronic ischemic changes of deep cerebral white matter. No acute intracranial abnormalities. Electronically Signed   By: Lavonia Dana M.D.   On: 11/12/2018 18:24    Pending Labs Unresulted Labs (From admission, onward)    Start     Ordered   11/13/18 0500  Hemoglobin A1c  Tomorrow morning,   R     11/12/18 2135   11/13/18 0500  Lipid  panel  Tomorrow morning,   R    Comments:   Please obtain as a fasting lipid panel - should not have eaten/ drank food for 8 hours prior to labs.    11/12/18 2135   11/13/18 0000  Vitamin B12  (Anemia Panel (PNL))  Once,   R     11/12/18 2207   11/13/18 0000  Folate  (Anemia Panel (PNL))  Once,   R     11/12/18 2207   11/13/18 0000  Iron and TIBC  (Anemia Panel (PNL))  Once,   R     11/12/18 2207   11/13/18 0000  Ferritin  (Anemia Panel (PNL))  Once,   R     11/12/18 2207   11/13/18 0000  Reticulocytes  (Anemia Panel (PNL))  Once,   R     11/12/18 2207   11/12/18 2136  Troponin I - Now Then Q6H  Now then every 6 hours,   R     11/12/18 2135   11/12/18 2134  Lactic acid, plasma  Once,   STAT     11/12/18 2134   11/12/18 2134  Procalcitonin  ONCE - STAT,   R     11/12/18 2134   11/12/18 2132  Culture, blood (routine x 2) Call MD if unable to obtain prior to antibiotics being given  BLOOD CULTURE X 2,   R    Comments:  If blood cultures drawn in Emergency Department - Do not draw and cancel order    11/12/18 2133   11/12/18 2132  Culture, sputum-assessment  Once,   R     11/12/18 2133   11/12/18 2132  Gram stain  Once,   R     11/12/18 2133   11/12/18 2132  HIV antibody (Routine Screening)  Once,   R     11/12/18 2133   11/12/18 2132  Strep pneumoniae urinary antigen  Once,   R     11/12/18 2133   11/12/18 2131  Influenza panel by PCR (type A & B)  Once,   R     11/12/18 2130   11/12/18 2131  Ammonia  Once,   R     11/12/18 2130   11/12/18 2129  Brain natriuretic peptide  ONCE - STAT,   R     11/12/18 2128          Vitals/Pain Today's Vitals   11/12/18 1930 11/12/18 2015 11/12/18 2130 11/12/18 2230  BP: (!) 159/71 (!) 156/79 128/66 (!) 161/88  Pulse: 73 67 70 90  Resp: 19 19 (!) 25 16  Temp:      TempSrc:      SpO2: 99% 100% 97% 100%  PainSc:        Isolation Precautions No active isolations  Medications Medications  allopurinol (ZYLOPRIM) tablet 100 mg (has no administration in time range)  aspirin EC  tablet 81 mg (has no administration in time range)  oxyCODONE (Oxy IR/ROXICODONE) immediate release tablet 5 mg (has no administration in time range)  metoprolol tartrate (LOPRESSOR) tablet 25 mg (has no administration in time range)  simvastatin (ZOCOR) tablet 10 mg (has no administration in time range)  insulin glargine (LANTUS) injection 5 Units (has no administration in time range)  magnesium oxide (MAG-OX) tablet 400 mg (has no administration in time range)  pantoprazole (PROTONIX) EC tablet 40 mg (has no administration in time range)  cilostazol (PLETAL) tablet 50 mg (has no administration in time range)  ferrous sulfate tablet  325 mg (has no administration in time range)  folic acid (FOLVITE) tablet 1 mg (has no administration in time range)  thiamine (VITAMIN B-1) tablet 100 mg (has no administration in time range)  albuterol (PROVENTIL) (2.5 MG/3ML) 0.083% nebulizer solution 2.5 mg (has no administration in time range)  montelukast (SINGULAIR) tablet 10 mg (has no administration in time range)  ipratropium-albuterol (DUONEB) 0.5-2.5 (3) MG/3ML nebulizer solution 3 mL (has no administration in time range)  dextromethorphan-guaiFENesin (MUCINEX DM) 30-600 MG per 12 hr tablet 1 tablet (has no administration in time range)  nicotine (NICODERM CQ - dosed in mg/24 hours) patch 21 mg (has no administration in time range)  insulin aspart (novoLOG) injection 0-9 Units (has no administration in time range)  nitroGLYCERIN (NITROSTAT) SL tablet 0.4 mg (has no administration in time range)  cefTRIAXone (ROCEPHIN) 1 g in sodium chloride 0.9 % 100 mL IVPB (has no administration in time range)  azithromycin (ZITHROMAX) 500 mg in sodium chloride 0.9 % 250 mL IVPB (has no administration in time range)  sodium chloride 0.9 % bolus 500 mL (0 mLs Intravenous Stopped 11/12/18 2026)    Followed by  0.9 %  sodium chloride infusion (has no administration in time range)  sodium chloride 0.9 % bolus 500 mL (0  mLs Intravenous Stopped 11/12/18 2142)  cefTRIAXone (ROCEPHIN) 1 g in sodium chloride 0.9 % 100 mL IVPB (0 g Intravenous Stopped 11/12/18 2142)  azithromycin (ZITHROMAX) tablet 500 mg (500 mg Oral Given 11/12/18 2029)    Mobility With walker  High fall risk   Focused Assessments Cardiac Assessment Handoff:    Lab Results  Component Value Date   CKTOTAL 167 08/11/2015   CKMB 1.6 05/13/2010   TROPONINI <0.03 08/20/2018   Lab Results  Component Value Date   DDIMER 2.01 (H) 08/20/2018   Does the Patient currently have chest pain? NO    R Recommendations: See Admitting Provider Note  Report given to:   Additional Notes:

## 2018-11-12 NOTE — H&P (Addendum)
History and Physical    [name] PNT:[id number] DOB: [birth year] DOA: 11/12/2018  Referring MD/NP/PA:   PCP: Nolene Ebbs, MD   Patient coming from:  The patient is coming from home.  At baseline, pt is independent for most of ADL.        Chief Complaint: Generalized weakness, cough, chest pain  HPI: [name] is a 73 y.o. female with medical history significant of hypertension, hyperlipidemia, diabetes mellitus, COPD on 2 L nasal cannula oxygen at home, CKD stage III, GI bleeding, DVT not on anticoagulants, alcoholic cirrhosis, alcohol abuse in remission, dCHF, diverticular GI bleeding, tobacco abuse, anemia, pork allergy, who presents with generalized weakness, cough, chest pain.  Patient states that she has been having generalized weakness for more than a month, which has worsened recently.  She does not have unilateral weakness or numbness in extremities, no facial droop or slurred speech.  She has poor appetite.  She states that she has dry cough and mild shortness of breath due to COPD,.  She also reports chest pain, which is located in substernal area, intermittent, dull, nonradiating, nonpleuritic, not aggravated by deep breath.  She states that currently she does not have chest pain.  Patient denies fever or chills.  No nausea, vomiting, diarrhea, abdominal pain, symptoms of UTI.  Denies rectal bleeding  ED Course: pt was found to have WBC 12.2 (patient seems to have chronic leukocytosis), pending urinalysis, hemoglobin 8.3 (9.3 on 09/26/2018), worsening renal function, temperature normal, no tachycardia, oxygen saturation 99% on 2 L nasal cannula oxygen.  Chest x-ray showed interstitial change with possible right apex hazy opacity.  CT head is negative for acute intracranial abnormalities.  Patient is placed on telemetry bed for observation.  Review of Systems:   General: no fevers, chills, no body weight gain, has poor appetite, has fatigue HEENT: no blurry vision,  hearing changes or sore throat Respiratory: has dyspnea, coughing, no wheezing CV: has chest pain, no palpitations GI: no nausea, vomiting, abdominal pain, diarrhea, constipation GU: no dysuria, burning on urination, increased urinary frequency, hematuria  Ext: no leg edema Neuro: no unilateral weakness, numbness, or tingling, no vision change or hearing loss Skin: no rash, no skin tear. MSK: No muscle spasm, no deformity, no limitation of range of movement in spin Heme: No easy bruising.  Travel history: No recent long distant travel.  Allergy:  Allergies  Allergen Reactions  . Compazine [Prochlorperazine Edisylate] Other (See Comments)    Altered mental status  . Penicillins Hives    Has patient had a PCN reaction causing immediate rash, facial/tongue/throat swelling, SOB or lightheadedness with hypotension: Yes Has patient had a PCN reaction causing severe rash involving mucus membranes or skin necrosis: No Has patient had a PCN reaction that required hospitalization: No Has patient had a PCN reaction occurring within the last 10 years: No If all of the above answers are "NO", then may proceed with Cephalosporin use.   Marland Kitchen Phenergan [Promethazine Hcl] Other (See Comments)    Altered mental status  . Lipitor [Atorvastatin] Swelling    Body swells, but doesn't affect breathing  . Chocolate Diarrhea  . Lactose Intolerance (Gi) Diarrhea  . Other Other (See Comments)    Nuts- Diverticulitis  . Pork-Derived Products Other (See Comments)    Causes GOUT    Past Medical History:  Diagnosis Date  . Acute venous embolism and thrombosis of deep vessels of proximal lower extremity ([place])   . Alcoholic cirrhosis ([place])   . Anemia   .  Anxiety   . Arthritis   . Asthma   . Back pain   . Blood transfusion few years ago  . Cellulitis    bilateral lower extremities  . CHF (congestive heart failure) ([place]) 10/13   grade 1 diastolic dysfunction, Nl LVF  . Chronic diastolic congestive heart  failure ([place])   . Chronic obstructive pulmonary disease (COPD) ([place])   . Colon polyps   . Depression   . Diabetes mellitus    insulin dep   . Gastroesophageal reflux   . GI bleeding 08/28/2018  . Glaucoma    left  . Gout   . Hepatitis    alcoholic hepatitis  . History of GI diverticular bleed   . Hypertension   . Leg swelling   . On home oxygen therapy    "2L just at night" (08/28/2018)  . Pneumonia    history of  . PVD (peripheral vascular disease) ([place]) 2009   bilat iliac stenting  . Reflux esophagitis   . Villous adenoma of colon 05/10/11    Past Surgical History:  Procedure Laterality Date  . ABDOMINAL HYSTERECTOMY  yrs ago  . BIOPSY  08/31/2018   Procedure: BIOPSY;  Surgeon: [name], MD;  Location: [facility];  Service: Gastroenterology;;  . CATARACT EXTRACTION Right yrs ago  . COLONOSCOPY    . COLONOSCOPY WITH PROPOFOL N/A 08/06/2013   Procedure: COLONOSCOPY WITH PROPOFOL;  Surgeon: Lear Ng, MD;  Location: [facility];  Service: Endoscopy;  Laterality: N/A;  . COLONOSCOPY WITH PROPOFOL N/A 08/31/2018   Procedure: COLONOSCOPY WITH PROPOFOL;  Surgeon: [name], MD;  Location: [facility];  Service: Gastroenterology;  Laterality: N/A;  . ESOPHAGOGASTRODUODENOSCOPY (EGD) WITH PROPOFOL N/A 08/06/2013   Procedure: ESOPHAGOGASTRODUODENOSCOPY (EGD) WITH PROPOFOL;  Surgeon: Lear Ng, MD;  Location: [facility];  Service: Endoscopy;  Laterality: N/A;  . ESOPHAGOGASTRODUODENOSCOPY (EGD) WITH PROPOFOL N/A 08/30/2018   Procedure: ESOPHAGOGASTRODUODENOSCOPY (EGD) WITH PROPOFOL;  Surgeon: [name], MD;  Location: [facility];  Service: Gastroenterology;  Laterality: N/A;  . LAPAROSCOPIC ASSISSTED TOTAL COLECTOMY W/ J-POUCH  04/22/11  . VENTRAL HERNIA REPAIR  06/13/2012   Procedure: LAPAROSCOPIC VENTRAL HERNIA;  Surgeon: Adin Hector, MD;  Location: [facility];  Service: General;  Laterality: N/A;  Laparoscopic Assisted Ventral Hernia with Mesh     Social History:  reports that she has been smoking cigarettes. She has a 7.50 pack-year smoking history. She has never used smokeless tobacco. She reports that she does not drink alcohol or use drugs.  Family History:  Family History  Problem Relation Age of Onset  . Cancer Brother        heent ca  . Cancer Sister        liver ca     Prior to Admission medications   Medication Sig Start Date End Date Taking? Authorizing Provider  acetaminophen (TYLENOL) 325 MG tablet Take 2 tablets (650 mg total) by mouth every 6 (six) hours as needed for mild pain, fever or headache (or Fever >/= 101). 09/01/18  Yes Emokpae, Courage, MD  albuterol (PROVENTIL HFA;VENTOLIN HFA) 108 (90 BASE) MCG/ACT inhaler Inhale 2 puffs into the lungs every 6 (six) hours as needed. For shortness of breath   Yes [provider]  albuterol (PROVENTIL) (2.5 MG/3ML) 0.083% nebulizer solution Take 2.5 mg by nebulization every 6 (six) hours as needed for wheezing or shortness of breath.   Yes [provider]  allopurinol (ZYLOPRIM) 100 MG tablet Take 100 mg by mouth daily.  01/18/16  [provider]  aspirin 81 MG EC tablet Take 1 tablet (81 mg total) by mouth daily with breakfast. 09/01/18   Denton Brick, Courage, MD  cilostazol (PLETAL) 100 MG tablet Take 50 mg by mouth 2 (two) times daily. 08/07/18   [provider]  diclofenac sodium (VOLTAREN) 1 % GEL Apply 2 g topically 4 (four) times daily.  05/21/18   [provider]  ergocalciferol (VITAMIN D2) 50000 UNITS capsule Take 50,000 Units by mouth every Monday. On Monday    [provider]  ferrous sulfate 325 (65 FE) MG tablet Take 325 mg by mouth daily with breakfast.    [provider]  fluticasone (FLONASE) 50 MCG/ACT nasal spray Place 2 sprays into the nose daily as needed for allergies.     [provider]  folic acid (FOLVITE) 1 MG tablet Take 1 tablet (1 mg total) by mouth daily. Patient not taking: Reported  on 09/22/2018 08/18/15   Modena Jansky, MD  glimepiride (AMARYL) 4 MG tablet Take 1 tablet (4 mg total) by mouth daily with breakfast. 09/01/18   Roxan Hockey, MD  hydrocortisone (ANUSOL-HC) 25 MG suppository Place 1 suppository (25 mg total) rectally 2 (two) times daily as needed for hemorrhoids or anal itching. 09/26/18   Norval Morton, MD  LANTUS SOLOSTAR 100 UNIT/ML Solostar Pen Inject 10 Units into the skin every morning. 09/26/18   Norval Morton, MD  magnesium oxide (MAG-OX) 400 MG tablet Take 400 mg by mouth daily.    [provider]  metoprolol tartrate (LOPRESSOR) 50 MG tablet Take 1 tablet (50 mg total) by mouth 2 (two) times daily. 09/26/18   Fuller Plan A, MD  montelukast (SINGULAIR) 10 MG tablet Take 10 mg by mouth at bedtime.     [provider]  omeprazole (PRILOSEC) 40 MG capsule Take 40 mg by mouth daily.     [provider]  oxyCODONE (OXY IR/ROXICODONE) 5 MG immediate release tablet Take 1-2 tablets (5-10 mg total) by mouth every 6 (six) hours as needed for moderate pain or severe pain. 09/26/18   Norval Morton, MD  OXYGEN Inhale 2 L into the lungs at bedtime.    [provider]  simvastatin (ZOCOR) 10 MG tablet Take 10 mg by mouth daily.  11/30/14   [provider]  sitaGLIPtin (JANUVIA) 100 MG tablet Take 100 mg by mouth daily.  03/27/14   [provider]  thiamine 100 MG tablet Take 1 tablet (100 mg total) by mouth daily. Patient not taking: Reported on 09/22/2018 08/18/15   Modena Jansky, MD  triamcinolone ointment (KENALOG) 0.1 % Apply 1 application topically at bedtime.  05/21/18   [provider]    Physical Exam: Vitals:   11/12/18 1915 11/12/18 1930 11/12/18 2015 11/12/18 2130  BP: 124/65 (!) 159/71 (!) 156/79 128/66  Pulse: 73 73 67 70  Resp: 20 19 19  (!) 25  Temp:      TempSrc:      SpO2: 99% 99% 100% 97%   General: Not in acute distress HEENT:       Eyes: PERRL, EOMI, no scleral  icterus.       ENT: No discharge from the ears and nose, no pharynx injection, no tonsillar enlargement.        Neck: No JVD, no bruit, no mass felt. Heme: No neck lymph node enlargement. Cardiac: S1/S2, RRR, No murmurs, No gallops or rubs. Respiratory: No rales, wheezing, rhonchi or rubs.  Slightly decreased  air movement bilaterally. GI: Soft, nondistended, nontender, no rebound pain, no organomegaly, BS present. GU: No hematuria Ext: No pitting leg edema bilaterally. 2+DP/PT pulse bilaterally. Musculoskeletal: No joint deformities, No joint redness or warmth, no limitation of ROM in spin. Skin: No rashes.  Neuro: Alert, oriented X3, cranial nerves II-XII grossly intact, moves all extremities normally. Muscle strength 5/5 in all extremities, sensation to light touch intact. Brachial reflex 2+ bilaterally. Negative Babinski's sign. Psych: Patient is not psychotic, no suicidal or hemocidal ideation.  Labs on Admission: I have personally reviewed following labs and imaging studies  CBC: Recent Labs  Lab 11/12/18 1719  WBC 14.2*  HGB 8.3*  HCT 28.1*  MCV 85.4  PLT 401*   Basic Metabolic Panel: Recent Labs  Lab 11/12/18 1719  NA 134*  K 4.5  CL 106  CO2 22  GLUCOSE 293*  BUN 50*  CREATININE 2.58*  CALCIUM 8.4*   GFR: CrCl cannot be calculated (Unknown ideal weight.). Liver Function Tests: Recent Labs  Lab 11/12/18 1719  AST 26  ALT 11  ALKPHOS 281*  BILITOT 0.2*  PROT 5.8*  ALBUMIN 2.1*   No results for input(s): LIPASE, AMYLASE in the last 168 hours. No results for input(s): AMMONIA in the last 168 hours. Coagulation Profile: No results for input(s): INR, PROTIME in the last 168 hours. Cardiac Enzymes: No results for input(s): CKTOTAL, CKMB, CKMBINDEX, TROPONINI in the last 168 hours. BNP (last 3 results) No results for input(s): PROBNP in the last 8760 hours. HbA1C: No results for input(s): HGBA1C in the last 72 hours. CBG: No results for input(s): GLUCAP  in the last 168 hours. Lipid Profile: No results for input(s): CHOL, HDL, LDLCALC, TRIG, CHOLHDL, LDLDIRECT in the last 72 hours. Thyroid Function Tests: No results for input(s): TSH, T4TOTAL, FREET4, T3FREE, THYROIDAB in the last 72 hours. Anemia Panel: No results for input(s): VITAMINB12, FOLATE, FERRITIN, TIBC, IRON, RETICCTPCT in the last 72 hours. Urine analysis:    Component Value Date/Time   COLORURINE STRAW (A) 11/12/2018 1950   APPEARANCEUR CLEAR 11/12/2018 1950   LABSPEC 1.006 11/12/2018 1950   PHURINE 6.0 11/12/2018 1950   GLUCOSEU NEGATIVE 11/12/2018 1950   HGBUR NEGATIVE 11/12/2018 1950   BILIRUBINUR NEGATIVE 11/12/2018 [place] NEGATIVE 11/12/2018 1950   PROTEINUR NEGATIVE 11/12/2018 1950   UROBILINOGEN 0.2 05/09/2015 2109   NITRITE NEGATIVE 11/12/2018 1950   LEUKOCYTESUR NEGATIVE 11/12/2018 1950   Sepsis Labs: @LABRCNTIP (procalcitonin:4,lacticidven:4) )No results found for this or any previous visit (from the past 240 hour(s)).   Radiological Exams on Admission: Dg Chest 2 View  Result Date: 11/12/2018 CLINICAL DATA:  Weakness. EXAM: CHEST - 2 VIEW COMPARISON:  08/28/2018 FINDINGS: Lungs are hyperexpanded. Interstitial markings are diffusely coarsened with chronic features. Hazy opacity noted in the right apex. No pleural effusion or evidence of pulmonary edema. The cardiopericardial silhouette is within normal limits for size. Bones are diffusely demineralized. Telemetry leads overlie the chest. Multiple thoracic compression fractures evident. IMPRESSION: 1. Hyperexpansion with underlying chronic interstitial changes. 2. Hazy opacity in the right lung apex, indeterminate but pneumonia not excluded. Follow-up recommended to ensure resolution. Electronically Signed   By: Misty Stanley M.D.   On: 11/12/2018 17:14   Ct Head Wo Contrast  Result Date: 11/12/2018 CLINICAL DATA:  Altered mental status EXAM: CT HEAD WITHOUT CONTRAST TECHNIQUE: Contiguous axial images  were obtained from the base of the skull through the vertex without intravenous contrast. Sagittal and coronal MPR images reconstructed from axial data set. COMPARISON:  08/28/2018 FINDINGS: Brain: Normal ventricular morphology. No midline shift or mass effect. Mild small vessel chronic ischemic changes of deep cerebral white matter. No intracranial hemorrhage, mass lesion or evidence acute infarction. No extra-axial fluid collections. Vascular: Atherosclerotic calcification of internal carotid and vertebral arteries at skull base Skull: Demineralized but intact Sinuses/Orbits: Deformity and chronic opacification of RIGHT maxillary sinus likely sequela of remote trauma. Remaining paranasal sinuses and mastoid air cells clear. Other: N/A IMPRESSION: Atrophy with mild small vessel chronic ischemic changes of deep cerebral white matter. No acute intracranial abnormalities. Electronically Signed   By: Lavonia Dana M.D.   On: 11/12/2018 18:24     EKG: Independently reviewed.  Sinus rhythm, QTC 443, early R wave progression, nonspecific T wave change.  Assessment/Plan Principal Problem:   COPD exacerbation (HCC) Active Problems:   Anemia   Acute renal failure superimposed on stage 3 chronic kidney disease (HCC)   Atypical chest pain- no history of CAD. Myoview low risk 2012   Cirrhosis of liver (HCC)   Hyperlipidemia   Chronic diastolic congestive heart failure (HCC)   Type II diabetes mellitus with renal manifestations (HCC)   Tobacco abuse   Generalized weakness   Chronic respiratory failure with hypoxia (HCC)  Chronic respiratory failure with hypoxia and COPD exacerbation: Patient has dry cough and mild shortness of breath.  She has slightly decreased air movement bilaterally on lung auscultation, indicating possible mild COPD.  Chest x-ray showed possible right apex hazy opacity, cannot completely rule out pneumonia.  She has leukocytosis with WBC 14.2, which seems to be chronic issue.  She has  chronic leukocytosis with WBC 13-15 recently.  Does not seem to have sepsis.  His body temperature is normal.  -will place on tele bed for obs -Nebulizers: scheduled Duoneb and prn albuterol - Abx: Rocephin and azithromycin were started in ED, will continue both now -Mucinex for cough  -Incentive spirometry -Urine S. pneumococcal antigen -Follow up blood culture x2, sputum culture, Flu pcr -Nasal cannula oxygen as needed to maintain O2 saturation 93% or greater  Atypical chest pain- no history of CAD. Myoview low risk 2012: Patient patient has intermittent atypical chest pain.   She has history of DVT and is not on anticoagulant, PE is a potential differential diagnosis, but patient is chest pain-free currently, no oxygen desaturation or signs of DVT.  I have low suspicions for PE. - cycle CE q6 x3 and repeat EKG in the am  - prn Nitroglycerin, and aspirin, zocor - Risk factor stratification: will check FLP and A1C   Anemia: Hgb 9.3 on 09/25/18-->8.3. No active bleeding. -f/u by CBC -check anemia panel  Acute renal failure superimposed on stage 3 chronic kidney disease ([place]): Baseline Cre is 1.3-1.5, pt's Cre is 2.58 and BUN 50  on admission. Likely due to prerenal secondary to dehydration - IVF: 500 ml of NS x 2 and then 755 cc/h - Follow up renal function by BMP - Hold Voltaren gel and lasix  Alcoholic Cirrhosis of liver ([place]): LFT normal.  Mental status normal.   -Check ammonia level  Hyperlipidemia: -zocor  Type II diabetes mellitus with renal manifestations ([place]): Last A1c 8.6 on 08/10/15, poorly controled. Patient is taking Januvia, Amaryl, Lantus at home -will decrease Lantus dose from 10 units to 5 units daily -SSI  Tobacco abuse and Alcohol abuse: Stopped drinking alcohol 5 years ago per patient. -Did counseling about importance of quitting smoking -Nicotine patch  Chronic dCHF: 2D echo on 06/18/2012 showed EF of 55  to 60% with grade 1 diastolic dysfunction.  Patient  does not have leg edema JVD.  CHF deemed to be compensated. - Hold Lasix due to worsening renal function -Check BNP  Generalized weakness: Likely multifactorial etiology, including COPD exacerbation, worsening renal function. -pending UA -pt/ot    DVT ppx: SCD Code Status: Full code Family Communication: None at bed side. Disposition Plan:  Anticipate discharge back to previous home environment Consults called:  none Admission status: Obs / tele     Date of Service 11/12/2018    [facility]   If 7PM-7AM, please contact night-coverage [email or web address] Password [facility] 11/12/2018, 10:16 PM

## 2018-11-13 ENCOUNTER — Other Ambulatory Visit: Payer: Self-pay

## 2018-11-13 DIAGNOSIS — Z79899 Other long term (current) drug therapy: Secondary | ICD-10-CM | POA: Diagnosis not present

## 2018-11-13 DIAGNOSIS — E1165 Type 2 diabetes mellitus with hyperglycemia: Secondary | ICD-10-CM | POA: Diagnosis present

## 2018-11-13 DIAGNOSIS — N183 Chronic kidney disease, stage 3 (moderate): Secondary | ICD-10-CM | POA: Diagnosis present

## 2018-11-13 DIAGNOSIS — E440 Moderate protein-calorie malnutrition: Secondary | ICD-10-CM | POA: Diagnosis not present

## 2018-11-13 DIAGNOSIS — M109 Gout, unspecified: Secondary | ICD-10-CM | POA: Diagnosis present

## 2018-11-13 DIAGNOSIS — E785 Hyperlipidemia, unspecified: Secondary | ICD-10-CM | POA: Diagnosis present

## 2018-11-13 DIAGNOSIS — Z7951 Long term (current) use of inhaled steroids: Secondary | ICD-10-CM | POA: Diagnosis not present

## 2018-11-13 DIAGNOSIS — Z9981 Dependence on supplemental oxygen: Secondary | ICD-10-CM | POA: Diagnosis not present

## 2018-11-13 DIAGNOSIS — E860 Dehydration: Secondary | ICD-10-CM | POA: Diagnosis not present

## 2018-11-13 DIAGNOSIS — J189 Pneumonia, unspecified organism: Secondary | ICD-10-CM | POA: Diagnosis not present

## 2018-11-13 DIAGNOSIS — E1122 Type 2 diabetes mellitus with diabetic chronic kidney disease: Secondary | ICD-10-CM | POA: Diagnosis present

## 2018-11-13 DIAGNOSIS — E1151 Type 2 diabetes mellitus with diabetic peripheral angiopathy without gangrene: Secondary | ICD-10-CM | POA: Diagnosis present

## 2018-11-13 DIAGNOSIS — J440 Chronic obstructive pulmonary disease with acute lower respiratory infection: Secondary | ICD-10-CM | POA: Diagnosis not present

## 2018-11-13 DIAGNOSIS — F17210 Nicotine dependence, cigarettes, uncomplicated: Secondary | ICD-10-CM | POA: Diagnosis not present

## 2018-11-13 DIAGNOSIS — K7030 Alcoholic cirrhosis of liver without ascites: Secondary | ICD-10-CM | POA: Diagnosis not present

## 2018-11-13 DIAGNOSIS — K7010 Alcoholic hepatitis without ascites: Secondary | ICD-10-CM | POA: Diagnosis not present

## 2018-11-13 DIAGNOSIS — Z720 Tobacco use: Secondary | ICD-10-CM | POA: Diagnosis not present

## 2018-11-13 DIAGNOSIS — J9611 Chronic respiratory failure with hypoxia: Secondary | ICD-10-CM | POA: Diagnosis not present

## 2018-11-13 DIAGNOSIS — I130 Hypertensive heart and chronic kidney disease with heart failure and stage 1 through stage 4 chronic kidney disease, or unspecified chronic kidney disease: Secondary | ICD-10-CM | POA: Diagnosis not present

## 2018-11-13 DIAGNOSIS — D649 Anemia, unspecified: Secondary | ICD-10-CM | POA: Diagnosis present

## 2018-11-13 DIAGNOSIS — J441 Chronic obstructive pulmonary disease with (acute) exacerbation: Secondary | ICD-10-CM | POA: Diagnosis not present

## 2018-11-13 DIAGNOSIS — E11649 Type 2 diabetes mellitus with hypoglycemia without coma: Secondary | ICD-10-CM | POA: Diagnosis present

## 2018-11-13 DIAGNOSIS — N179 Acute kidney failure, unspecified: Secondary | ICD-10-CM | POA: Diagnosis not present

## 2018-11-13 DIAGNOSIS — K210 Gastro-esophageal reflux disease with esophagitis: Secondary | ICD-10-CM | POA: Diagnosis present

## 2018-11-13 DIAGNOSIS — I5032 Chronic diastolic (congestive) heart failure: Secondary | ICD-10-CM | POA: Diagnosis not present

## 2018-11-13 LAB — BASIC METABOLIC PANEL
Anion gap: 6 (ref 5–15)
BUN: 45 mg/dL — ABNORMAL HIGH (ref 8–23)
CALCIUM: 8.1 mg/dL — AB (ref 8.9–10.3)
CO2: 21 mmol/L — ABNORMAL LOW (ref 22–32)
Chloride: 112 mmol/L — ABNORMAL HIGH (ref 98–111)
Creatinine, Ser: 2.14 mg/dL — ABNORMAL HIGH (ref 0.44–1.00)
GFR, EST AFRICAN AMERICAN: 26 mL/min — AB (ref >60)
GFR, EST NON AFRICAN AMERICAN: 22 mL/min — AB (ref >60)
Glucose, Bld: 120 mg/dL — ABNORMAL HIGH (ref 70–99)
Potassium: 4.3 mmol/L (ref 3.5–5.1)
Sodium: 139 mmol/L (ref 135–145)

## 2018-11-13 LAB — PROCALCITONIN: Procalcitonin: 0.44 ng/mL

## 2018-11-13 LAB — RETICULOCYTES
Immature Retic Fract: 16.8 % — ABNORMAL HIGH (ref 2.3–15.9)
RBC.: 3.03 MIL/uL — ABNORMAL LOW (ref 3.87–5.11)
Retic Count, Absolute: 83.9 10*3/uL (ref 19.0–186.0)
Retic Ct Pct: 2.8 % (ref 0.4–3.1)

## 2018-11-13 LAB — GLUCOSE, CAPILLARY
Glucose-Capillary: 135 mg/dL — ABNORMAL HIGH (ref 70–99)
Glucose-Capillary: 115 mg/dL — ABNORMAL HIGH (ref 70–99)
Glucose-Capillary: 224 mg/dL — ABNORMAL HIGH (ref 70–99)
Glucose-Capillary: 103 mg/dL — ABNORMAL HIGH (ref 70–99)

## 2018-11-13 LAB — LIPID PANEL
Cholesterol: 70 mg/dL (ref 0–200)
HDL: 26 mg/dL — ABNORMAL LOW (ref >40)
LDL Cholesterol: 25 mg/dL (ref 0–99)
Total CHOL/HDL Ratio: 2.7 RATIO
Triglycerides: 96 mg/dL (ref <150)
VLDL: 19 mg/dL (ref 0–40)

## 2018-11-13 LAB — TROPONIN I
Troponin I: <0.03 ng/mL (ref <0.03)
Troponin I: <0.03 ng/mL (ref <0.03)
Troponin I: <0.03 ng/mL (ref <0.03)

## 2018-11-13 LAB — AMMONIA: Ammonia: 33 umol/L (ref 9–35)

## 2018-11-13 LAB — IRON AND TIBC
Iron: 24 ug/dL — ABNORMAL LOW (ref 28–170)
Saturation Ratios: 15 % (ref 10.4–31.8)
TIBC: 158 ug/dL — AB (ref 250–450)
UIBC: 134 ug/dL

## 2018-11-13 LAB — INFLUENZA PANEL BY PCR (TYPE A & B)
Influenza A By PCR: NEGATIVE
Influenza B By PCR: NEGATIVE

## 2018-11-13 LAB — LACTIC ACID, PLASMA: Lactic Acid, Venous: 1.5 mmol/L (ref 0.5–1.9)

## 2018-11-13 LAB — HEMOGLOBIN A1C
Hgb A1c MFr Bld: 8.4 % — ABNORMAL HIGH (ref 4.8–5.6)
MEAN PLASMA GLUCOSE: 194.38 mg/dL

## 2018-11-13 LAB — HIV ANTIBODY (ROUTINE TESTING W REFLEX): HIV SCREEN 4TH GENERATION: NONREACTIVE

## 2018-11-13 LAB — BRAIN NATRIURETIC PEPTIDE: B Natriuretic Peptide: 192.9 pg/mL — ABNORMAL HIGH (ref 0.0–100.0)

## 2018-11-13 LAB — VITAMIN B12: Vitamin B-12: 2983 pg/mL — ABNORMAL HIGH (ref 180–914)

## 2018-11-13 LAB — FERRITIN: Ferritin: 79 ng/mL (ref 11–307)

## 2018-11-13 LAB — FOLATE: Folate: 93.9 ng/mL (ref >5.9)

## 2018-11-13 LAB — STREP PNEUMONIAE URINARY ANTIGEN: Strep Pneumo Urinary Antigen: NEGATIVE

## 2018-11-13 MED ORDER — HYDRALAZINE HCL 20 MG/ML IJ SOLN
10.0000 mg | Freq: Once | INTRAMUSCULAR | Status: AC
  Administered 2018-11-16: 10 mg via INTRAVENOUS
  Filled 2018-11-13 (×2): qty 1

## 2018-11-13 MED ORDER — DICLOFENAC SODIUM 1 % TD GEL
2.0000 g | Freq: Four times a day (QID) | TRANSDERMAL | Status: DC
  Administered 2018-11-13 – 2018-11-19 (×18): 2 g via TOPICAL
  Filled 2018-11-13: qty 100

## 2018-11-13 MED ORDER — INSULIN GLARGINE 100 UNIT/ML ~~LOC~~ SOLN
10.0000 [IU] | Freq: Every day | SUBCUTANEOUS | Status: DC
  Administered 2018-11-14 – 2018-11-16 (×3): 10 [IU] via SUBCUTANEOUS
  Filled 2018-11-13 (×3): qty 0.1

## 2018-11-13 MED ORDER — IPRATROPIUM-ALBUTEROL 0.5-2.5 (3) MG/3ML IN SOLN: 3.0000 mL | Freq: Four times a day (QID) | RESPIRATORY_TRACT | Status: DC | PRN

## 2018-11-13 NOTE — Progress Notes (Signed)
Occupational Therapy Evaluation Patient Details Name: [name] MRN: [id number] DOB: 1946/05/28 Today's Date: 11/13/2018    History of Present Illness [name] is an 73 y.o. female with medical history significant of hypertension, hyperlipidemia, diabetes mellitus, COPD on 2 L nasal cannula oxygen at home, CKD stage III, GI bleeding, DVT not on anticoagulants, alcoholic cirrhosis, alcohol abuse in remission, dCHF, diverticular GI bleeding, tobacco abuse, anemia, pork allergy, who presents with generalized weakness, cough, chest pain   Clinical Impression   PTA, pt lived at home alone and had a PCA 3hrs/day 7 days/wk who assisted with ADL and IADL tasks. Pt states she is fearful when her PCA is "not there". Pt demonstrates generalized weakness and has difficulty sustaining attention to tasks to complete tasks in a timely manor. Requires min A for mobility and ADL @ RW level. Feel pt will benefit form rehab at SNF to maximize functional level of independence. Will follow acutely. VSS during session on RA.     Follow Up Recommendations  SNF;Supervision/Assistance - 24 hour    Equipment Recommendations  Other (comment)(TBA)    Recommendations for Other Services       Precautions / Restrictions Precautions Precautions: Fall Restrictions Weight Bearing Restrictions: No      Mobility Bed Mobility Overal bed mobility: Needs Assistance Bed Mobility: Supine to Sit     Supine to sit: Min assist     General bed mobility comments: min A for elevation of trunk from Side Lying to sitting, increased time needed  Transfers Overall transfer level: Needs assistance Equipment used: Rolling walker (2 wheeled) Transfers: Sit to/from Stand Sit to Stand: Min assist;+2 safety/equipment Stand pivot transfers: Min assist       General transfer comment: min A for power up, +2 for safety. Min A for stability with pivot to [facility] with RW    Balance Overall balance assessment: Needs  assistance Sitting-balance support: Single extremity supported;Feet supported Sitting balance-Leahy Scale: Fair     Standing balance support: Bilateral upper extremity supported Standing balance-Leahy Scale: Poor Standing balance comment: needs UE support for safety. Pt very fearful of falling                           ADL either performed or assessed with clinical judgement   ADL Overall ADL's : Needs assistance/impaired     Grooming: Set up;Sitting   Upper Body Bathing: Set up;Sitting   Lower Body Bathing: Minimal assistance;Sit to/from stand   Upper Body Dressing : Set up;Supervision/safety;Sitting   Lower Body Dressing: Moderate assistance;Sit to/from stand   Toilet Transfer: Minimal assistance;RW;BSC;Stand-pivot   Toileting- Clothing Manipulation and Hygiene: Minimal assistance;Sit to/from stand       Functional mobility during ADLs: Minimal assistance;Rolling walker;Cueing for safety General ADL Comments: Poor attention during ADL; requires mod redirectional cues to complete tasks     Vision   Additional Comments: unsure of vision; wears glasses; Pt has had cataracts removed     Perception     Praxis      Pertinent Vitals/Pain Pain Assessment: No/denies pain     Hand Dominance Right   Extremity/Trunk Assessment Upper Extremity Assessment Upper Extremity Assessment: Generalized weakness   Lower Extremity Assessment Lower Extremity Assessment: Defer to PT evaluation   Cervical / Trunk Assessment Cervical / Trunk Assessment: Kyphotic   Communication Communication Communication: No difficulties   Cognition Arousal/Alertness: Lethargic Behavior During Therapy: WFL for tasks assessed/performed Overall Cognitive Status: No family/caregiver present to  determine baseline cognitive functioning                                 General Comments: very slow processing, improved with alertness and being OOB; poor attention; internally  distracted   General Comments       Exercises     Shoulder Instructions      Home Living Family/patient expects to be discharged to:: Private residence Living Arrangements: Alone Available Help at Discharge: Personal care attendant Type of Home: Apartment Home Access: Level entry     [place]: One level     Bathroom Shower/Tub: Teacher, early years/pre: Standard Bathroom Accessibility: Yes How Accessible: Accessible via walker Home Equipment: [place] - 2 wheels;Walker - 4 wheels;Bedside commode;Cane - single point;Cane - quad;Tub bench   Additional Comments: caregiver 3 hrs/ day 7 days/wk. Pt reports that she is fearful when caregiver is not there      Prior Functioning/Environment Level of Independence: Needs assistance  Gait / Transfers Assistance Needed: ambulates with cane and rollator outdoors ADL's / Homemaking Assistance Needed: Aide assists with bathing/dressing and IADLs            OT Problem List: Decreased strength;Decreased activity tolerance;Impaired balance (sitting and/or standing);Decreased safety awareness      OT Treatment/Interventions: Self-care/ADL training;Therapeutic exercise;Energy conservation;DME and/or AE instruction;Therapeutic activities;Cognitive remediation/compensation;Patient/family education;Balance training    OT Goals(Current goals can be found in the care plan section) Acute Rehab OT Goals Patient Stated Goal: be safe OT Goal Formulation: With patient Time For Goal Achievement: 11/27/18 Potential to Achieve Goals: Good  OT Frequency: Min 2X/week   Barriers to D/C: Decreased caregiver support          Co-evaluation PT/OT/SLP Co-Evaluation/Treatment: Yes Reason for Co-Treatment: For patient/therapist safety;To address functional/ADL transfers PT goals addressed during session: Mobility/safety with mobility;Balance;Proper use of DME OT goals addressed during session: ADL's and self-care      AM-PAC OT "6  Clicks" Daily Activity     Outcome Measure Help from another person eating meals?: None Help from another person taking care of personal grooming?: A Little Help from another person toileting, which includes using toliet, bedpan, or urinal?: A Little Help from another person bathing (including washing, rinsing, drying)?: A Little Help from another person to put on and taking off regular upper body clothing?: A Little Help from another person to put on and taking off regular lower body clothing?: A Lot 6 Click Score: 18   End of Session Equipment Utilized During Treatment: Gait belt;Rolling walker Nurse Communication: Mobility status  Activity Tolerance: Patient tolerated treatment well Patient left: in chair;with call bell/phone within reach;with chair alarm set  OT Visit Diagnosis: Unsteadiness on feet (R26.81);Muscle weakness (generalized) (M62.81)                Time: 9373-4287 OT Time Calculation (min): 36 min Charges:  OT General Charges $OT Visit: 1 Visit OT Evaluation $OT Eval Moderate Complexity: [place], OT/L   Acute OT Clinical Specialist [place] Pager [phone] Office [phone]   [facility] 11/13/2018, 4:11 PM

## 2018-11-13 NOTE — Progress Notes (Signed)
Progress Note    [name]  WJX:[id number] DOB: 08/31/1945  DOA: 11/12/2018 PCP: Nolene Ebbs, MD    Brief Narrative:     Medical records reviewed and are as summarized below:  ARLEEN BAR is an 73 y.o. female with medical history significant of hypertension, hyperlipidemia, diabetes mellitus, COPD on 2 L nasal cannula oxygen at home, CKD stage III, GI bleeding, DVT not on anticoagulants, alcoholic cirrhosis, alcohol abuse in remission, dCHF, diverticular GI bleeding, tobacco abuse, anemia, pork allergy, who presents with generalized weakness, cough, chest pain-- appears to have CAP.    Assessment/Plan:   Principal Problem:   COPD exacerbation (HCC) Active Problems:   Anemia   Acute renal failure superimposed on stage 3 chronic kidney disease (HCC)   Atypical chest pain- no history of CAD. Myoview low risk 2012   Cirrhosis of liver (HCC)   Hyperlipidemia   Chronic diastolic congestive heart failure (HCC)   Type II diabetes mellitus with renal manifestations (HCC)   Tobacco abuse   Generalized weakness   Chronic respiratory failure with hypoxia (HCC)  Chronic respiratory failure with hypoxia and CAP (infiltrate in RUL): -Nebulizers: scheduled Duoneb and prn albuterol - Abx: Rocephin and azithromycin were started in ED, will continue both  -Mucinex for cough  -Incentive spirometry -flu negative -per H&P wears 2L at home -CURB 65 scores shows moderate risk  Anemia: Hgb 9.3 on 09/25/18-->8.3. No active bleeding. -trend -probably chronic disease  Acute renal failure superimposed on stage 3 chronic kidney disease ([place]): Baseline Cre is 1.3-1.5, pt's Cre is 2.58 and BUN 50  on admission. Likely due to prerenal secondary to dehydration - unfortunately no BMP ordered when admitted so will order for now -continue IVF until results back  Alcoholic Cirrhosis of liver ([place]): LFT normal.  Mental status normal-- ammonia level not indicated  Hyperlipidemia: -zocor   Type II diabetes mellitus with renal manifestations ([place]): Last A1c 8.6 on 08/10/15, poorly controled. Patient is taking Januvia, Amaryl, Lantus at home -increase lantus back to home dose -SSI  Tobacco abuse and Alcohol abuse: - Stopped drinking alcohol 5 years ago per patient. -nicotine patch  Chronic dCHF: 2D echo on 06/18/2012 showed EF of 55 to 60% with grade 1 diastolic dysfunction.  Patient does not have leg edema JVD.  CHF deemed to be compensated. - Hold Lasix due to worsening renal function -Check BNP  Generalized weakness: Likely multifactorial etiology, including COPD exacerbation, worsening renal function. -PT/OT   Family Communication/Anticipated D/C date and plan/Code Status   DVT prophylaxis: Lovenox ordered. Code Status: Full Code.  Family Communication: none at bedside Disposition Plan:    Medical Consultants:    None.     Subjective:   Sleepy and says she does not feel well  Objective:    Vitals:   11/13/18 0352 11/13/18 0624 11/13/18 0755 11/13/18 0857  BP: 132/67 132/67  110/67  Pulse: 70 70  89  Resp:  14  16  Temp:  98.3 F (36.8 C)  98.1 F (36.7 C)  TempSrc:  Oral  Oral  SpO2:   98% 99%  Weight:  51.2 kg    Height:  5' (1.524 m)      Intake/Output Summary (Last 24 hours) at 11/13/2018 1137 Last data filed at 11/13/2018 1000 Gross per 24 hour  Intake 2580 ml  Output 1 ml  Net 2579 ml   Filed Weights   11/13/18 0624  Weight: 51.2 kg    Exam: In bed, cover  pulled over head rrr Diminished due to poor effort, no wheezing heard +BS, soft No rashes  Data Reviewed:   I have personally reviewed following labs and imaging studies:  Labs: Labs show the following:   Basic Metabolic Panel: Recent Labs  Lab 11/12/18 1719  NA 134*  K 4.5  CL 106  CO2 22  GLUCOSE 293*  BUN 50*  CREATININE 2.58*  CALCIUM 8.4*   GFR Estimated Creatinine Clearance: 13.7 mL/min (A) (by C-G formula based on SCr of 2.58 mg/dL (H)).  Liver Function Tests: Recent Labs  Lab 11/12/18 1719  AST 26  ALT 11  ALKPHOS 281*  BILITOT 0.2*  PROT 5.8*  ALBUMIN 2.1*   No results for input(s): LIPASE, AMYLASE in the last 168 hours. Recent Labs  Lab 11/12/18 2339  AMMONIA 33   Coagulation profile No results for input(s): INR, PROTIME in the last 168 hours.  CBC: Recent Labs  Lab 11/12/18 1719  WBC 14.2*  HGB 8.3*  HCT 28.1*  MCV 85.4  PLT 418*   Cardiac Enzymes: Recent Labs  Lab 11/12/18 2339 11/13/18 0358 11/13/18 0919  TROPONINI <0.03 <0.03 <0.03   BNP (last 3 results) No results for input(s): PROBNP in the last 8760 hours. CBG: Recent Labs  Lab 11/13/18 0856  GLUCAP 135*   D-Dimer: No results for input(s): DDIMER in the last 72 hours. Hgb A1c: Recent Labs    11/13/18 0358  HGBA1C 8.4*   Lipid Profile: Recent Labs    11/13/18 0358  CHOL 70  HDL 26*  LDLCALC 25  TRIG 96  CHOLHDL 2.7   Thyroid function studies: No results for input(s): TSH, T4TOTAL, T3FREE, THYROIDAB in the last 72 hours.  Invalid input(s): FREET3 Anemia work up: Recent Labs    11/12/18 2340  VITAMINB12 2,983*  FOLATE 93.9  FERRITIN 79  TIBC 158*  IRON 24*  RETICCTPCT 2.8   Sepsis Labs: Recent Labs  Lab 11/12/18 1719 11/12/18 2339 11/12/18 2340  PROCALCITON  --  0.44  --   WBC 14.2*  --   --   LATICACIDVEN  --   --  1.5    Microbiology No results found for this or any previous visit (from the past 240 hour(s)).  Procedures and diagnostic studies:  Dg Chest 2 View  Result Date: 11/12/2018 CLINICAL DATA:  Weakness. EXAM: CHEST - 2 VIEW COMPARISON:  08/28/2018 FINDINGS: Lungs are hyperexpanded. Interstitial markings are diffusely coarsened with chronic features. Hazy opacity noted in the right apex. No pleural effusion or evidence of pulmonary edema. The cardiopericardial silhouette is within normal limits for size. Bones are diffusely demineralized. Telemetry leads overlie the chest. Multiple  thoracic compression fractures evident. IMPRESSION: 1. Hyperexpansion with underlying chronic interstitial changes. 2. Hazy opacity in the right lung apex, indeterminate but pneumonia not excluded. Follow-up recommended to ensure resolution. Electronically Signed   By: Misty Stanley M.D.   On: 11/12/2018 17:14   Ct Head Wo Contrast  Result Date: 11/12/2018 CLINICAL DATA:  Altered mental status EXAM: CT HEAD WITHOUT CONTRAST TECHNIQUE: Contiguous axial images were obtained from the base of the skull through the vertex without intravenous contrast. Sagittal and coronal MPR images reconstructed from axial data set. COMPARISON:  08/28/2018 FINDINGS: Brain: Normal ventricular morphology. No midline shift or mass effect. Mild small vessel chronic ischemic changes of deep cerebral white matter. No intracranial hemorrhage, mass lesion or evidence acute infarction. No extra-axial fluid collections. Vascular: Atherosclerotic calcification of internal carotid and vertebral arteries at skull base Skull:  Demineralized but intact Sinuses/Orbits: Deformity and chronic opacification of RIGHT maxillary sinus likely sequela of remote trauma. Remaining paranasal sinuses and mastoid air cells clear. Other: N/A IMPRESSION: Atrophy with mild small vessel chronic ischemic changes of deep cerebral white matter. No acute intracranial abnormalities. Electronically Signed   By: Lavonia Dana M.D.   On: 11/12/2018 18:24    Medications:   . allopurinol  100 mg Oral Daily  . aspirin EC  81 mg Oral Q breakfast  . cilostazol  50 mg Oral BID  . ferrous sulfate  325 mg Oral Q breakfast  . folic acid  1 mg Oral Daily  . hydrALAZINE  10 mg Intravenous Once  . insulin aspart  0-9 Units Subcutaneous TID WC  . insulin glargine  5 Units Subcutaneous Daily  . magnesium oxide  400 mg Oral Daily  . metoprolol tartrate  25 mg Oral BID  . montelukast  10 mg Oral QHS  . nicotine  21 mg Transdermal Daily  . pantoprazole  40 mg Oral Daily  .  simvastatin  10 mg Oral q1800  . thiamine  100 mg Oral Daily   Continuous Infusions: . sodium chloride 1,000 mL (11/13/18 0942)  . azithromycin    . cefTRIAXone (ROCEPHIN)  IV       LOS: 0 days   Geradine Girt  [facility]   How to contact the [facility] Attending or Consulting provider [place] or covering provider during after hours [place], for this patient?  1. Check the care team in [facility] and look for a) attending/consulting [facility] provider listed and b) the [facility] team listed 2. Log into [email or web address] and use [place]'s universal password to access. If you do not have the password, please contact the hospital operator. 3. Locate the [facility] provider you are looking for under [facility] and page to a number that you can be directly reached. 4. If you still have difficulty reaching the provider, please page the [facility] (Director on Call) for the Hospitalists listed on amion for assistance.  11/13/2018, 11:37 AM

## 2018-11-13 NOTE — Plan of Care (Signed)
  Problem: Education: Goal: Knowledge of General Education information will improve Description Including pain rating scale, medication(s)/side effects and non-pharmacologic comfort measures Outcome: Progressing   Problem: Clinical Measurements: Goal: Respiratory complications will improve Outcome: Progressing   Problem: Activity: Goal: Risk for activity intolerance will decrease Outcome: Progressing   Problem: Coping: Goal: Level of anxiety will decrease Outcome: Progressing   Problem: Pain Managment: Goal: General experience of comfort will improve Outcome: Progressing

## 2018-11-13 NOTE — Evaluation (Signed)
Physical Therapy Evaluation Patient Details Name: [name] MRN: [id number] DOB: March 31, 1946 Today's Date: 11/13/2018   History of Present Illness  [name] is an 73 y.o. female with medical history significant of hypertension, hyperlipidemia, diabetes mellitus, COPD on 2 L nasal cannula oxygen at home, CKD stage III, GI bleeding, DVT not on anticoagulants, alcoholic cirrhosis, alcohol abuse in remission, dCHF, diverticular GI bleeding, tobacco abuse, anemia, pork allergy, who presents with generalized weakness, cough, chest pain  Clinical Impression  Pt admitted with above diagnosis. Pt currently with functional limitations due to the deficits listed below (see PT Problem List). Pt processing and mobilizing very slowly, not functional speed for being home alone. Pt has caregiver in home some of the day but alone the rest of the time. Min A needed for all mobility.  Pt will benefit from skilled PT to increase their independence and safety with mobility to allow discharge to the venue listed below.       Follow Up Recommendations SNF;Supervision/Assistance - 24 hour    Equipment Recommendations  None recommended by PT    Recommendations for Other Services       Precautions / Restrictions Precautions Precautions: Fall Restrictions Weight Bearing Restrictions: No      Mobility  Bed Mobility Overal bed mobility: Needs Assistance Bed Mobility: Supine to Sit     Supine to sit: Min assist     General bed mobility comments: min A for elevation of trunk from SL to sitting, increased time needed  Transfers Overall transfer level: Needs assistance Equipment used: Rolling walker (2 wheeled) Transfers: Sit to/from Stand Sit to Stand: Min assist;+2 safety/equipment Stand pivot transfers: Min assist       General transfer comment: min A for power up, +2 for safety. Min A for stability with pivot to [facility] with RW  Ambulation/Gait Ambulation/Gait assistance: Min  assist Gait Distance (Feet): 3 Feet Assistive device: Rolling walker (2 wheeled) Gait Pattern/deviations: Step-through pattern;Decreased stride length;Trunk flexed Gait velocity: decreased Gait velocity interpretation: <1.31 ft/sec, indicative of household ambulator General Gait Details: pt needed safety cues for backing all the way to chair and continuing with motion, self distracted  Stairs            Wheelchair Mobility    Modified Rankin (Stroke Patients Only)       Balance Overall balance assessment: Needs assistance Sitting-balance support: Single extremity supported;Feet supported Sitting balance-Leahy Scale: Fair     Standing balance support: Bilateral upper extremity supported Standing balance-Leahy Scale: Poor Standing balance comment: needs UE support for safety. Pt very fearful of falling                             Pertinent Vitals/Pain Pain Assessment: No/denies pain    Home Living Family/patient expects to be discharged to:: Private residence Living Arrangements: Alone Available Help at Discharge: Personal care attendant Type of Home: Apartment Home Access: Level entry     Home Layout: One level Home Equipment: [place] - 2 wheels;Walker - 4 wheels;Bedside commode;Cane - single point;Cane - quad;Tub bench Additional Comments: caregiver 3 hrs/ day 7 days/wk. Pt reports that she is fearful when caregiver is not there    Prior Function Level of Independence: Needs assistance   Gait / Transfers Assistance Needed: ambulates with cane and rollator outdoors  ADL's / Homemaking Assistance Needed: Aide assists with bathing/dressing and IADLs        Hand Dominance   Dominant Hand:  Right    Extremity/Trunk Assessment   Upper Extremity Assessment Upper Extremity Assessment: Defer to OT evaluation    Lower Extremity Assessment Lower Extremity Assessment: Generalized weakness    Cervical / Trunk Assessment Cervical / Trunk Assessment:  Kyphotic  Communication   Communication: No difficulties  Cognition Arousal/Alertness: Lethargic Behavior During Therapy: WFL for tasks assessed/performed Overall Cognitive Status: No family/caregiver present to determine baseline cognitive functioning                                 General Comments: very slow processing, improved with alertness and being OOB      General Comments      Exercises     Assessment/Plan    PT Assessment Patient needs continued PT services  PT Problem List Decreased strength;Decreased activity tolerance;Decreased balance;Decreased mobility;Decreased coordination;Decreased cognition;Decreased knowledge of precautions;Decreased safety awareness       PT Treatment Interventions DME instruction;Gait training;Functional mobility training;Therapeutic activities;Therapeutic exercise;Balance training;Neuromuscular re-education;Cognitive remediation;Patient/family education    PT Goals (Current goals can be found in the Care Plan section)  Acute Rehab PT Goals Patient Stated Goal: be safe PT Goal Formulation: With patient Time For Goal Achievement: 11/27/18 Potential to Achieve Goals: Good    Frequency Min 2X/week   Barriers to discharge Decreased caregiver support caregiver several hours a day but really needs 24 hr supervison    Co-evaluation PT/OT/SLP Co-Evaluation/Treatment: Yes Reason for Co-Treatment: For patient/therapist safety PT goals addressed during session: Mobility/safety with mobility;Balance;Proper use of DME         AM-PAC PT "6 Clicks" Mobility  Outcome Measure Help needed turning from your back to your side while in a flat bed without using bedrails?: A Little Help needed moving from lying on your back to sitting on the side of a flat bed without using bedrails?: A Little Help needed moving to and from a bed to a chair (including a wheelchair)?: A Little Help needed standing up from a chair using your arms (e.g.,  wheelchair or bedside chair)?: A Little Help needed to walk in hospital room?: A Lot Help needed climbing 3-5 steps with a railing? : Total 6 Click Score: 15    End of Session   Activity Tolerance: Patient tolerated treatment well Patient left: in chair;with call bell/phone within reach;with chair alarm set Nurse Communication: Mobility status PT Visit Diagnosis: Unsteadiness on feet (R26.81);Muscle weakness (generalized) (M62.81);Difficulty in walking, not elsewhere classified (R26.2)    Time: 8916-9450 PT Time Calculation (min) (ACUTE ONLY): 28 min   Charges:   PT Evaluation $PT Eval Moderate Complexity: [place]  Pager [phone] Office [place] 11/13/2018, 3:15 PM

## 2018-11-14 LAB — CBC
HCT: 23.1 % — ABNORMAL LOW (ref 36.0–46.0)
Hemoglobin: 6.8 g/dL — CL (ref 12.0–15.0)
MCH: 25 pg — ABNORMAL LOW (ref 26.0–34.0)
MCHC: 29.4 g/dL — ABNORMAL LOW (ref 30.0–36.0)
MCV: 84.9 fL (ref 80.0–100.0)
Platelets: 357 10*3/uL (ref 150–400)
RBC: 2.72 MIL/uL — ABNORMAL LOW (ref 3.87–5.11)
RDW: 15.9 % — ABNORMAL HIGH (ref 11.5–15.5)
WBC: 13.7 10*3/uL — ABNORMAL HIGH (ref 4.0–10.5)
nRBC: 0 % (ref 0.0–0.2)

## 2018-11-14 LAB — GLUCOSE, CAPILLARY
Glucose-Capillary: 71 mg/dL (ref 70–99)
Glucose-Capillary: 112 mg/dL — ABNORMAL HIGH (ref 70–99)
Glucose-Capillary: 171 mg/dL — ABNORMAL HIGH (ref 70–99)
Glucose-Capillary: 216 mg/dL — ABNORMAL HIGH (ref 70–99)

## 2018-11-14 LAB — BASIC METABOLIC PANEL
Anion gap: 5 (ref 5–15)
BUN: 42 mg/dL — AB (ref 8–23)
CO2: 19 mmol/L — ABNORMAL LOW (ref 22–32)
Calcium: 7.8 mg/dL — ABNORMAL LOW (ref 8.9–10.3)
Chloride: 112 mmol/L — ABNORMAL HIGH (ref 98–111)
Creatinine, Ser: 2.2 mg/dL — ABNORMAL HIGH (ref 0.44–1.00)
GFR calc Af Amer: 25 mL/min — ABNORMAL LOW (ref >60)
GFR calc non Af Amer: 21 mL/min — ABNORMAL LOW (ref >60)
Glucose, Bld: 146 mg/dL — ABNORMAL HIGH (ref 70–99)
Potassium: 4.3 mmol/L (ref 3.5–5.1)
SODIUM: 136 mmol/L (ref 135–145)

## 2018-11-14 LAB — HEMOGLOBIN AND HEMATOCRIT, BLOOD
HCT: 24.8 % — ABNORMAL LOW (ref 36.0–46.0)
Hemoglobin: 7.5 g/dL — ABNORMAL LOW (ref 12.0–15.0)

## 2018-11-14 LAB — PREPARE RBC (CROSSMATCH)

## 2018-11-14 MED ORDER — AZITHROMYCIN 250 MG PO TABS
500.0000 mg | ORAL_TABLET | Freq: Every day | ORAL | Status: AC
  Administered 2018-11-14 – 2018-11-18 (×5): 500 mg via ORAL
  Filled 2018-11-14 (×5): qty 2

## 2018-11-14 MED ORDER — HYDRALAZINE HCL 20 MG/ML IJ SOLN
5.0000 mg | Freq: Once | INTRAMUSCULAR | Status: AC
  Administered 2018-11-14: 5 mg via INTRAVENOUS
  Filled 2018-11-14: qty 1

## 2018-11-14 MED ORDER — SODIUM CHLORIDE 0.9% IV SOLUTION: Freq: Once | INTRAVENOUS | Status: DC

## 2018-11-14 NOTE — Progress Notes (Addendum)
PROGRESS NOTE  [name] OYD:[id number] DOB: 03/27/1946 DOA: 11/12/2018 PCP: Nolene Ebbs, MD  HPI/Recap of past 24 hours: [name] is an 73 y.o. female with medical history significant ofhypertension, hyperlipidemia, diabetes mellitus, COPD on 2 L nasal cannula oxygen at home, CKD stage III, GI bleeding, DVT not on anticoagulants, alcoholic cirrhosis, alcohol abuse in remission,dCHF, diverticular GI bleeding, tobacco abuse, anemia, pork allergy, who presents with generalized weakness, cough, chest pain-- appears to have CAP.    11/14/18: Seen and examined at bedside.  No acute events overnight.  States her cough is improving.  Denies dyspnea at rest.  Assessment/Plan: Principal Problem:   COPD exacerbation (HCC) Active Problems:   Anemia   Acute renal failure superimposed on stage 3 chronic kidney disease (HCC)   Atypical chest pain- no history of CAD. Myoview low risk 2012   Cirrhosis of liver (HCC)   Hyperlipidemia   Chronic diastolic congestive heart failure (HCC)   Type II diabetes mellitus with renal manifestations (HCC)   Tobacco abuse   Generalized weakness   Chronic respiratory failure with hypoxia (HCC)  Right upper lobe community-acquired pneumonia, present admission Continue antibiotics Continue nebs Continue antitussives On p.o. azithromycin and Rocephin Monitor fever and WBC Maintain O2 saturation greater than 92%  AKI on CKD 3 Baseline creatinine is 1.51 with GFR of 39 Presented with creatinine of 2.58 Creatinine today on 11/14/2018 is 2.1 4 repeat BMP in the morning Continue to avoid nephrotoxic agents/dehydration/hypotension Monitor urine output next  Chronic normocytic anemia Baseline hemoglobin appears to be 9 Hemoglobin dropped to 7.5 this morning on 11/14/2018 from 8.3 with MCV of 85 No sign of overt bleeding Obtain iron studies Transfuse if hemoglobin drops below 7 Repeat CBC in the morning  Chronic hypoxic respiratory failure On  2 L of oxygen by nasal cannula at baseline Continue O2 supplementation to maintain O2 saturation greater than 92%  Generalized weakness/physical debility/ambulatory dysfunction PT OT recommended SNF CSW consulted for placement Fall precautions    DVT prophylaxis: SQ Lovenox daily Code Status: Full Code.  Family Communication: none at bedside Disposition Plan: possible dc tomorrow pending SNF placement   Objective: Vitals:   11/13/18 1527 11/13/18 2045 11/13/18 2300 11/14/18 0742  BP: 127/66  118/65 133/72  Pulse: 78  81 75  Resp: 16  16 16   Temp: 98.8 F (37.1 C)  99.2 F (37.3 C) 98.5 F (36.9 C)  TempSrc: Oral  Oral Oral  SpO2: 98% 100% 95% 100%  Weight:      Height:        Intake/Output Summary (Last 24 hours) at 11/14/2018 1302 Last data filed at 11/14/2018 0500 Gross per 24 hour  Intake 850 ml  Output 2 ml  Net 848 ml   Filed Weights   11/13/18 0624  Weight: 51.2 kg    Exam:  . General: 73 y.o. year-old female well developed well nourished in no acute distress.  Alert and interactive. . Cardiovascular: Regular rate and rhythm with no rubs or gallops.  No thyromegaly or JVD noted.   Marland Kitchen Respiratory: Mild rales with no wheezes. Good inspiratory effort. . Abdomen: Soft nontender nondistended with normal bowel sounds x4 quadrants. . Musculoskeletal: Trace lower extremity edema. 2/4 pulses in all 4 extremities. Marland Kitchen Psychiatry: Mood is appropriate for condition and setting   Data Reviewed: CBC: Recent Labs  Lab 11/12/18 1719 11/14/18 0227 11/14/18 1123  WBC 14.2* 13.7*  --   HGB 8.3* 6.8* 7.5*  HCT 28.1* 23.1* 24.8*  MCV 85.4 84.9  --   PLT 418* 357  --    Basic Metabolic Panel: Recent Labs  Lab 11/12/18 1719 11/13/18 1209 11/14/18 0227  NA 134* 139 136  K 4.5 4.3 4.3  CL 106 112* 112*  CO2 22 21* 19*  GLUCOSE 293* 120* 146*  BUN 50* 45* 42*  CREATININE 2.58* 2.14* 2.20*  CALCIUM 8.4* 8.1* 7.8*   GFR: Estimated Creatinine Clearance: 16.1  mL/min (A) (by C-G formula based on SCr of 2.2 mg/dL (H)). Liver Function Tests: Recent Labs  Lab 11/12/18 1719  AST 26  ALT 11  ALKPHOS 281*  BILITOT 0.2*  PROT 5.8*  ALBUMIN 2.1*   No results for input(s): LIPASE, AMYLASE in the last 168 hours. Recent Labs  Lab 11/12/18 2339  AMMONIA 33   Coagulation Profile: No results for input(s): INR, PROTIME in the last 168 hours. Cardiac Enzymes: Recent Labs  Lab 11/12/18 2339 11/13/18 0358 11/13/18 0919  TROPONINI <0.03 <0.03 <0.03   BNP (last 3 results) No results for input(s): PROBNP in the last 8760 hours. HbA1C: Recent Labs    11/13/18 0358  HGBA1C 8.4*   CBG: Recent Labs  Lab 11/13/18 1159 11/13/18 1653 11/13/18 2103 11/14/18 0740 11/14/18 1153  GLUCAP 115* 224* 103* 71 112*   Lipid Profile: Recent Labs    11/13/18 0358  CHOL 70  HDL 26*  LDLCALC 25  TRIG 96  CHOLHDL 2.7   Thyroid Function Tests: No results for input(s): TSH, T4TOTAL, FREET4, T3FREE, THYROIDAB in the last 72 hours. Anemia Panel: Recent Labs    11/12/18 2340  VITAMINB12 2,983*  FOLATE 93.9  FERRITIN 79  TIBC 158*  IRON 24*  RETICCTPCT 2.8   Urine analysis:    Component Value Date/Time   COLORURINE STRAW (A) 11/12/2018 1950   APPEARANCEUR CLEAR 11/12/2018 1950   LABSPEC 1.006 11/12/2018 1950   PHURINE 6.0 11/12/2018 1950   GLUCOSEU NEGATIVE 11/12/2018 1950   HGBUR NEGATIVE 11/12/2018 1950   BILIRUBINUR NEGATIVE 11/12/2018 1950   KETONESUR NEGATIVE 11/12/2018 1950   PROTEINUR NEGATIVE 11/12/2018 1950   UROBILINOGEN 0.2 05/09/2015 2109   NITRITE NEGATIVE 11/12/2018 1950   LEUKOCYTESUR NEGATIVE 11/12/2018 1950   Sepsis Labs: @LABRCNTIP (procalcitonin:4,lacticidven:4)  ) Recent Results (from the past 240 hour(s))  Culture, blood (routine x 2) Call MD if unable to obtain prior to antibiotics being given     Status: None (Preliminary result)   Collection Time: 11/12/18 11:42 PM  Result Value Ref Range Status   Specimen  Description BLOOD LEFT ARM  Final   Special Requests   Final    BOTTLES DRAWN AEROBIC AND ANAEROBIC Blood Culture adequate volume   Culture   Final    NO GROWTH 1 DAY Performed at [facility], [place] [address] [place], [place] [address]    Report Status PENDING  Incomplete  Culture, blood (Routine X 2) w Reflex to ID Panel     Status: None (Preliminary result)   Collection Time: 11/13/18  5:51 AM  Result Value Ref Range Status   Specimen Description BLOOD LEFT HAND  Final   Special Requests   Final    BOTTLES DRAWN AEROBIC AND ANAEROBIC Blood Culture adequate volume   Culture   Final    NO GROWTH 1 DAY Performed at [facility], [place] [address] [place], [place] [address]    Report Status PENDING  Incomplete      Studies: No results found.  Scheduled Meds: . sodium chloride  Intravenous Once  . allopurinol  100 mg Oral Daily  . aspirin EC  81 mg Oral Q breakfast  . azithromycin  500 mg Oral Daily  . cilostazol  50 mg Oral BID  . diclofenac sodium  2 g Topical QID  . ferrous sulfate  325 mg Oral Q breakfast  . folic acid  1 mg Oral Daily  . hydrALAZINE  10 mg Intravenous Once  . insulin aspart  0-9 Units Subcutaneous TID WC  . insulin glargine  10 Units Subcutaneous Daily  . magnesium oxide  400 mg Oral Daily  . metoprolol tartrate  25 mg Oral BID  . montelukast  10 mg Oral QHS  . nicotine  21 mg Transdermal Daily  . pantoprazole  40 mg Oral Daily  . simvastatin  10 mg Oral q1800  . thiamine  100 mg Oral Daily    Continuous Infusions: . sodium chloride 1,000 mL (11/13/18 0942)  . cefTRIAXone (ROCEPHIN)  IV 1 g (11/13/18 2057)     LOS: 1 day     Kayleen Memos, MD [facility] Pager [phone]  If 7PM-7AM, please contact night-coverage [email or web address] Password [email or web address] 11/14/2018, 1:02 PM

## 2018-11-14 NOTE — Progress Notes (Addendum)
Initial Nutrition Assessment  DOCUMENTATION CODES:   Non-severe (moderate) malnutrition in context of chronic illness  INTERVENTION:    Glucerna Shake po BID, each supplement provides 220 kcal and 10 grams of protein  NUTRITION DIAGNOSIS:   Moderate Malnutrition related to chronic illness(COPD, CHF, CKD) as evidenced by moderate muscle depletion, moderate fat depletion  GOAL:   Patient will meet greater than or equal to 90% of their needs  MONITOR:   PO intake, Supplement acceptance, Labs, Skin, Weight trends, I & O's  REASON FOR ASSESSMENT:   Malnutrition Screening Tool  ASSESSMENT:   73 y.o. Female with PMH of significant of HTN, COPD on 2 L nasal cannula oxygen at home, CKD stage CHF; presented with COPD exacerbation.  RD spoke with patient at bedside. She reports she ate well at breakfast this AM. Pt was consuming 3 meals per day prior to admission.  Pt also drinks Glucerna Shake supplements at home. She suspects she's lost weight, however, unable to identify quantity. Labs & medications reviewed.  CBG's [id number].  Pt meets criteria for moderate malnutrition in the context of chronic illness per Academy of Nutrition and Dietetics (AND) and American Society for Parenteral and Enteral Nutrition (ASPEN) guidelines.  NUTRITION - FOCUSED PHYSICAL EXAM:    Most Recent Value  Orbital Region  Mild depletion  Upper Arm Region  Moderate depletion  Thoracic and Lumbar Region  Moderate depletion  Buccal Region  Mild depletion  Temple Region  Mild depletion  Clavicle Bone Region  Moderate depletion  Clavicle and Acromion Bone Region  Moderate depletion  Scapular Bone Region  Moderate depletion  Dorsal Hand  Mild depletion  Patellar Region  Moderate depletion  Anterior Thigh Region  Moderate depletion  Posterior Calf Region  Moderate depletion  Edema (RD Assessment)  None     Diet Order:   Diet Order            Diet Carb Modified Fluid consistency: Thin; Room  service appropriate? Yes  Diet effective now             EDUCATION NEEDS:   No education needs have been identified at this time  Skin:  Skin Assessment: Reviewed RN Assessment  Last BM:  3/16  Height:   Ht Readings from Last 1 Encounters:  11/13/18 5' (1.524 m)   Weight:   Wt Readings from Last 1 Encounters:  11/13/18 51.2 kg   BMI:  Body mass index is 22.03 kg/m.  Estimated Nutritional Needs:   Kcal:  1500-1700  Protein:  70-85 gm  Fluid:  1.5-1.7 L  Arthur Holms, RD, LDN Pager #: [phone] After-Hours Pager #: [phone]

## 2018-11-14 NOTE — TOC Initial Note (Signed)
Transition of Care [facility]) - Initial/Assessment Note    Patient Details  Name: [name] MRN: [id number] Date of Birth: 1946/05/15  [facility] of [facility]) CM/SW Contact:    Eileen Stanford, LCSW Phone Number: 11/14/2018, 9:03 AM  Clinical Narrative:     Pt alert and oriented. Pt states she lives alone at home. Pt was last at [facility]. Pt is agreeable to go back to [facility]. CSW to follow up with facility.              Expected Discharge Plan: Skilled Nursing Facility Barriers to Discharge: Ship broker, Continued Medical Work up   Patient Goals and CMS Choice   CMS Medicare.gov Compare Post Acute Care list provided to:: Patient Choice offered to / list presented to : Patient  Expected Discharge Plan and Services Expected Discharge Plan: [place] Choice: [place] Living arrangements for the past 2 months: Single Family Home, [place]                          Prior Living Arrangements/Services Living arrangements for the past 2 months: [place], [place] Lives with:: Self Patient language and need for interpreter reviewed:: No Do you feel safe going back to the place where you live?: Yes      Need for Family Participation in Patient Care: No (Comment) Care giver support system in place?: Yes (comment)   Criminal Activity/Legal Involvement Pertinent to Current Situation/Hospitalization: No - Comment as needed  Activities of Daily Living Home Assistive Devices/Equipment: Gilford Rile (specify type) ADL Screening (condition at time of admission) Patient's cognitive ability adequate to safely complete daily activities?: Yes Is the patient deaf or have difficulty hearing?: No Does the patient have difficulty seeing, even when wearing glasses/contacts?: No Does the patient have difficulty concentrating, remembering, or making decisions?: No Patient  able to express need for assistance with ADLs?: Yes Does the patient have difficulty dressing or bathing?: Yes Independently performs ADLs?: No Communication: Independent Dressing (OT): Needs assistance Is this a change from baseline?: Pre-admission baseline Grooming: Needs assistance Is this a change from baseline?: Pre-admission baseline Feeding: Independent Bathing: Needs assistance Is this a change from baseline?: Pre-admission baseline Toileting: Needs assistance Is this a change from baseline?: Pre-admission baseline In/Out Bed: Needs assistance Is this a change from baseline?: Pre-admission baseline Walks in Home: Independent with device (comment) Does the patient have difficulty walking or climbing stairs?: Yes Weakness of Legs: Both Weakness of Arms/Hands: Both  Permission Sought/Granted Permission sought to share information with : Facility Arts administrator granted to share info w AGENCY: [facility]        Emotional Assessment Appearance:: Appears stated age Attitude/Demeanor/Rapport: (pt was appropriate) Affect (typically observed): Accepting, Appropriate, Calm Orientation: : Oriented to Situation, Oriented to  Time, Oriented to Place, Oriented to Self Alcohol / Substance Use: Not Applicable Psych Involvement: No (comment)  Admission diagnosis:  Dehydration [E86.0] AKI (acute kidney injury) ([place]) [N17.9] Community acquired pneumonia, unspecified laterality [J18.9] Patient Active Problem List   Diagnosis Date Noted  . Type II diabetes mellitus with renal manifestations ([place]) 11/12/2018  . Tobacco abuse 11/12/2018  . Generalized weakness 11/12/2018  . Chronic respiratory failure with hypoxia ([place]) 11/12/2018  . Internal hemorrhoid, bleeding 09/26/2018  . Liver masses 09/26/2018  . Leukocytosis 09/26/2018  . AKI (acute kidney injury) ([place]) 08/28/2018  .  Gout 08/10/2015  . Hypoglycemia   . Hypothermia   . UTI (lower urinary  tract infection)   . Humeral head fracture 06/05/2015  . Idiopathic chronic gout of multiple sites without tophus 06/05/2015  . Chronic renal disease, stage III ([place]) 06/05/2015  . Nausea vomiting and diarrhea 05/09/2015  . Increased urinary frequency 05/09/2015  . Sepsis ([place]) 05/09/2015  . Hypokalemia   . Chronic diastolic congestive heart failure ([place])   . Hyperlipidemia 02/10/2015  . Cellulitis 11/04/2014  . Essential hypertension 08/27/2014  . Abdominal pain, acute, bilateral lower quadrant 07/26/2014  . Colitis 07/26/2014  . GI bleeding 06/14/2014  . Bright red blood per rectum 06/14/2014  . Acute encephalopathy 06/14/2014  . Personal history of colonic polyps 08/06/2013  . Knee pain, acute 05/20/2013  . Claudication ([place]) 03/13/2013  . Obesity 03/13/2013  . PVD, LEIA/LIIA and RCIA pta 7/09- ABIs 0.68 and 0.69 Feb 2014 11/17/2012  . PUD (peptic ulcer disease) GI bleed in past (2011) 11/17/2012  . Cirrhosis of liver ([place]) 11/17/2012  . Colon cancer, lap chole 2012 11/17/2012  . 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 Atypical chest pain- no history of CAD. Myoview low risk 2012 11/15/2012  . Controlled diabetes mellitus type 2 with complications ([place]) 81/19/1478  . Acute renal failure superimposed on stage 3 chronic kidney disease ([place]) 06/16/2012  . Incisional hernia 01/09/2012  . Anemia 01/28/2011  . COPD exacerbation ([place]) 01/28/2011  . Sleep apnea 01/28/2011  . Transfusion history 01/28/2011  . Sinus problem 01/28/2011  . Full dentures 01/28/2011   PCP:  Nolene Ebbs, MD Pharmacy:   [facility], [address] [place] [address] Phone: [phone] Fax: [place], [place] [address] [place] [address] Phone: [phone] Fax: [phone]     Social Determinants of  Health (SDOH) Interventions    Readmission Risk Interventions 30 Day Unplanned Readmission Risk Score     ED to Hosp-Admission (Current) from 11/12/2018 in [place] [facility] [place] [facility]  30 Day Unplanned Readmission Risk Score (%)  31 Filed at 11/14/2018 0801     This score is the patient's risk of an unplanned readmission within 30 days of being discharged (0 -100%). The score is based on dignosis, age, lab data, medications, orders, and past utilization.   Low:  0-14.9   Medium: 15-21.9   High: 22-29.9   Extreme: 30 and above       No flowsheet data found.

## 2018-11-14 NOTE — Progress Notes (Signed)
PHARMACIST - PHYSICIAN COMMUNICATION DR:   [place] Crane CONCERNING: Antibiotic IV to Oral Route Change Policy  RECOMMENDATION: This patient is receiving azithromycin by the intravenous route.  Based on criteria approved by the Pharmacy and Therapeutics Committee, the antibiotic(s) is/are being converted to the equivalent oral dose form(s).   DESCRIPTION: These criteria include:  Patient being treated for a respiratory tract infection, urinary tract infection, cellulitis or clostridium difficile associated diarrhea if on metronidazole  The patient is not neutropenic and does not exhibit a GI malabsorption state  The patient is eating (either orally or via tube) and/or has been taking other orally administered medications for a least 24 hours  The patient is improving clinically and has a Tmax < 100.5  If you have questions about this conversion, please contact the Pharmacy Department  []   [phone] )  [facility] []   [phone] )  [facility] [x]   [phone] )  [facility] []   [phone] )  [facility] []   [phone] )  [place], PharmD, [place], AAHIVP, CPP Infectious Disease Pharmacist 11/14/2018 11:40 AM

## 2018-11-14 NOTE — [place] FL2 (Signed)
[place] MEDICAID FL2 LEVEL OF CARE SCREENING TOOL     IDENTIFICATION  Patient Name: [name] Birthdate: Nov 17, 1945 Sex: female Admission Date (Current Location): 11/12/2018  [facility] and [place] Number:  Herbalist and Address:  [facility] [place] [address], [place], [place] [address]      Provider Number: [id number]  Attending Physician Name and Address:  Kayleen Memos, DO  Relative Name and Phone Number:       Current Level of Care: Hospital Recommended Level of Care: [place] Prior Approval Number:    Date Approved/Denied:   PASRR Number: [id number]  Discharge Plan: SNF    Current Diagnoses: Patient Active Problem List   Diagnosis Date Noted  . Type II diabetes mellitus with renal manifestations ([place]) 11/12/2018  . Tobacco abuse 11/12/2018  . Generalized weakness 11/12/2018  . Chronic respiratory failure with hypoxia ([place]) 11/12/2018  . Internal hemorrhoid, bleeding 09/26/2018  . Liver masses 09/26/2018  . Leukocytosis 09/26/2018  . AKI (acute kidney injury) ([place]) 08/28/2018  . Gout 08/10/2015  . Hypoglycemia   . Hypothermia   . UTI (lower urinary tract infection)   . Humeral head fracture 06/05/2015  . Idiopathic chronic gout of multiple sites without tophus 06/05/2015  . Chronic renal disease, stage III ([place]) 06/05/2015  . Nausea vomiting and diarrhea 05/09/2015  . Increased urinary frequency 05/09/2015  . Sepsis ([place]) 05/09/2015  . Hypokalemia   . Chronic diastolic congestive heart failure ([place])   . Hyperlipidemia 02/10/2015  . Cellulitis 11/04/2014  . Essential hypertension 08/27/2014  . Abdominal pain, acute, bilateral lower quadrant 07/26/2014  . Colitis 07/26/2014  . GI bleeding 06/14/2014  . Bright red blood per rectum 06/14/2014  . Acute encephalopathy 06/14/2014  . Personal history of colonic polyps 08/06/2013  . Knee pain, acute 05/20/2013  . Claudication ([place]) 03/13/2013  .  Obesity 03/13/2013  . PVD, LEIA/LIIA and RCIA pta 7/09- ABIs 0.68 and 0.69 Feb 2014 11/17/2012  . PUD (peptic ulcer disease) GI bleed in past (2011) 11/17/2012  . Cirrhosis of liver ([place]) 11/17/2012  . Colon cancer, lap chole 2012 11/17/2012  . 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 Atypical chest pain- no history of CAD. Myoview low risk 2012 11/15/2012  . Controlled diabetes mellitus type 2 with complications ([place]) 54/56/2563  . Acute renal failure superimposed on stage 3 chronic kidney disease ([place]) 06/16/2012  . Incisional hernia 01/09/2012  . Anemia 01/28/2011  . COPD exacerbation ([place]) 01/28/2011  . Sleep apnea 01/28/2011  . Transfusion history 01/28/2011  . Sinus problem 01/28/2011  . Full dentures 01/28/2011    Orientation RESPIRATION BLADDER Height & Weight     Self, Time, Situation, Place  Normal External catheter, Incontinent(placed 3/17) Weight: 112 lb 12.8 oz (51.2 kg) Height:  5' (152.4 cm)  BEHAVIORAL SYMPTOMS/MOOD NEUROLOGICAL BOWEL NUTRITION STATUS      Continent Diet(carb modified, thin liquids)  AMBULATORY STATUS COMMUNICATION OF NEEDS Skin   Limited Assist Verbally Normal                       Personal Care Assistance Level of Assistance  Bathing, Feeding, Dressing Bathing Assistance: Limited assistance Feeding assistance: Independent Dressing Assistance: Limited assistance     Functional Limitations Info  Sight, Speech, Hearing Sight Info: Adequate Hearing Info: Adequate Speech Info: Adequate    SPECIAL CARE FACTORS FREQUENCY  PT (By licensed PT), OT (By licensed OT)  PT Frequency: 5x OT Frequency: 5x            Contractures Contractures Info: Not present    Additional Factors Info  Code Status, Allergies Code Status Info: Full Code Allergies Info: Compazine Prochlorperazine Edisylate, Penicillins, Phenergan Promethazine Hcl, Lipitor Atorvastatin, Chocolate, Lactose Intolerance (Gi), Other, Pork-derived  Products           Current Medications (11/14/2018):  This is the current hospital active medication list Current Facility-Administered Medications  Medication Dose Route Frequency Provider Last Rate Last Dose  . 0.9 %  sodium chloride infusion (Manually program via Guardrails IV Fluids)   Intravenous Once Schorr, Rhetta Mura, NP      . 0.9 %  sodium chloride infusion  1,000 mL Intravenous Continuous Ivor Costa, MD 75 mL/hr at 11/13/18 0942 1,000 mL at 11/13/18 0942  . albuterol (PROVENTIL) (2.5 MG/3ML) 0.083% nebulizer solution 2.5 mg  2.5 mg Nebulization Q4H PRN Ivor Costa, MD      . allopurinol (ZYLOPRIM) tablet 100 mg  100 mg Oral Daily Ivor Costa, MD   100 mg at 11/13/18 0941  . aspirin EC tablet 81 mg  81 mg Oral Q breakfast Ivor Costa, MD   81 mg at 11/13/18 0940  . azithromycin (ZITHROMAX) 500 mg in sodium chloride 0.9 % 250 mL IVPB  500 mg Intravenous Q24H Ivor Costa, MD 250 mL/hr at 11/13/18 1639 500 mg at 11/13/18 1639  . cefTRIAXone (ROCEPHIN) 1 g in sodium chloride 0.9 % 100 mL IVPB  1 g Intravenous Q24H Ivor Costa, MD 200 mL/hr at 11/13/18 2057 1 g at 11/13/18 2057  . cilostazol (PLETAL) tablet 50 mg  50 mg Oral BID Ivor Costa, MD   50 mg at 11/13/18 2058  . dextromethorphan-guaiFENesin (MUCINEX DM) 30-600 MG per 12 hr tablet 1 tablet  1 tablet Oral BID PRN Ivor Costa, MD      . diclofenac sodium (VOLTAREN) 1 % transdermal gel 2 g  2 g Topical QID Vann, Jessica U, DO   2 g at 11/13/18 2100  . ferrous sulfate tablet 325 mg  325 mg Oral Q breakfast Ivor Costa, MD   325 mg at 11/13/18 0940  . folic acid (FOLVITE) tablet 1 mg  1 mg Oral Daily Ivor Costa, MD   1 mg at 11/13/18 0941  . hydrALAZINE (APRESOLINE) injection 10 mg  10 mg Intravenous Once Schorr, Rhetta Mura, NP      . insulin aspart (novoLOG) injection 0-9 Units  0-9 Units Subcutaneous TID WC Ivor Costa, MD   3 Units at 11/13/18 1836  . insulin glargine (LANTUS) injection 10 Units  10 Units Subcutaneous Daily Vann, Jessica  U, DO      . ipratropium-albuterol (DUONEB) 0.5-2.5 (3) MG/3ML nebulizer solution 3 mL  3 mL Nebulization Q6H PRN Eliseo Squires, Jessica U, DO      . magnesium oxide (MAG-OX) tablet 400 mg  400 mg Oral Daily Ivor Costa, MD   400 mg at 11/13/18 0940  . metoprolol tartrate (LOPRESSOR) tablet 25 mg  25 mg Oral BID Ivor Costa, MD   25 mg at 11/13/18 2057  . montelukast (SINGULAIR) tablet 10 mg  10 mg Oral QHS Ivor Costa, MD   10 mg at 11/13/18 2058  . nicotine (NICODERM CQ - dosed in mg/24 hours) patch 21 mg  21 mg Transdermal Daily Ivor Costa, MD   21 mg at 11/13/18 0941  . nitroGLYCERIN (NITROSTAT) SL tablet 0.4 mg  0.4 mg Sublingual Q5 min PRN Blaine Hamper,  Soledad Gerlach, MD      . oxyCODONE (Oxy IR/ROXICODONE) immediate release tablet 5 mg  5 mg Oral Q6H PRN Ivor Costa, MD      . pantoprazole (PROTONIX) EC tablet 40 mg  40 mg Oral Daily Ivor Costa, MD   40 mg at 11/13/18 0940  . simvastatin (ZOCOR) tablet 10 mg  10 mg Oral q1800 Ivor Costa, MD   10 mg at 11/13/18 1641  . thiamine (VITAMIN B-1) tablet 100 mg  100 mg Oral Daily Ivor Costa, MD   100 mg at 11/13/18 0940     Discharge Medications: Please see discharge summary for a list of discharge medications.  Relevant Imaging Results:  Relevant Lab Results:   Additional Information SSN: [id number]  Eileen Stanford, LCSW

## 2018-11-15 DIAGNOSIS — E440 Moderate protein-calorie malnutrition: Secondary | ICD-10-CM

## 2018-11-15 LAB — GLUCOSE, CAPILLARY
GLUCOSE-CAPILLARY: 46 mg/dL — AB (ref 70–99)
Glucose-Capillary: 86 mg/dL (ref 70–99)
Glucose-Capillary: 50 mg/dL — ABNORMAL LOW (ref 70–99)
Glucose-Capillary: 42 mg/dL — CL (ref 70–99)
Glucose-Capillary: 63 mg/dL — ABNORMAL LOW (ref 70–99)
Glucose-Capillary: 121 mg/dL — ABNORMAL HIGH (ref 70–99)
Glucose-Capillary: 95 mg/dL (ref 70–99)
Glucose-Capillary: 112 mg/dL — ABNORMAL HIGH (ref 70–99)

## 2018-11-15 LAB — HEMOGLOBIN AND HEMATOCRIT, BLOOD
HCT: 26.8 % — ABNORMAL LOW (ref 36.0–46.0)
Hemoglobin: 7.9 g/dL — ABNORMAL LOW (ref 12.0–15.0)

## 2018-11-15 LAB — CREATININE, SERUM
Creatinine, Ser: 1.88 mg/dL — ABNORMAL HIGH (ref 0.44–1.00)
GFR calc Af Amer: 30 mL/min — ABNORMAL LOW (ref >60)
GFR calc non Af Amer: 26 mL/min — ABNORMAL LOW (ref >60)

## 2018-11-15 MED ORDER — AZITHROMYCIN 250 MG PO TABS: ORAL_TABLET | ORAL | 0 refills | Status: DC

## 2018-11-15 MED ORDER — CEFDINIR 300 MG PO CAPS
300.0000 mg | ORAL_CAPSULE | Freq: Every day | ORAL | Status: AC
  Administered 2018-11-15 – 2018-11-18 (×4): 300 mg via ORAL
  Filled 2018-11-15 (×4): qty 1

## 2018-11-15 MED ORDER — CEFDINIR 300 MG PO CAPS: 300.0000 mg | ORAL_CAPSULE | Freq: Every day | ORAL | 0 refills | Status: DC

## 2018-11-15 MED ORDER — NICOTINE 21 MG/24HR TD PT24: 21.0000 mg | MEDICATED_PATCH | Freq: Every day | TRANSDERMAL | 0 refills | Status: DC

## 2018-11-15 MED ORDER — DEXTROSE 50 % IV SOLN
INTRAVENOUS | Status: AC
  Administered 2018-11-15: 50 mL
  Filled 2018-11-15: qty 50

## 2018-11-15 NOTE — TOC Progression Note (Signed)
Transition of Care [facility]) - Progression Note    Patient Details  Name: [name] MRN: [id number] Date of Birth: 1946-08-21  [facility] CM/SW Contact  Eileen Stanford, LCSW Phone Number: 11/15/2018, 3:41 PM  Clinical Narrative:   [name] is discussing case with director to get approval for medicaid bed. CSW has not heard back yet.     Expected Discharge Plan: Skilled Nursing Facility Barriers to Discharge: Ship broker, Continued Medical Work up  Expected Discharge Plan and Services Expected Discharge Plan: [place] Choice: [place] Living arrangements for the past 2 months: [place], [place] Expected Discharge Date: 11/15/18                         Social Determinants of Health (SDOH) Interventions    Readmission Risk Interventions Readmission Risk Prevention Plan 11/15/2018  Transportation Screening Complete  Medication Review Press photographer) Complete  PCP or Specialist appointment within 3-5 days of discharge Complete  HRI or [place] Not Complete  HRI or Home Care Consult Pt Refusal Comments N/A  SW Recovery Care/Counseling Consult Complete  Palliative Care Screening Not Complete  Comments N/A  Skilled Nursing Facility Complete  Some recent data might be hidden

## 2018-11-15 NOTE — Clinical Social Work Note (Signed)
Readmission risk screening completed. No addition resources needed at this time. CSW will continue to assist with placement.   [place], Edgar

## 2018-11-15 NOTE — Discharge Instructions (Signed)
Community-Acquired Pneumonia, Adult °Pneumonia is an infection of the lungs. It causes swelling in the airways of the lungs. Mucus and fluid may also build up inside the airways. °One type of pneumonia can happen while a person is in a hospital. A different type can happen when a person is not in a hospital (community-acquired pneumonia).  °What are the causes? ° °This condition is caused by germs (viruses, bacteria, or fungi). Some types of germs can be passed from one person to another. This can happen when you breathe in droplets from the cough or sneeze of an infected person. °What increases the risk? °You are more likely to develop this condition if you: °· Have a long-term (chronic) disease, such as: °? Chronic obstructive pulmonary disease (COPD). °? Asthma. °? Cystic fibrosis. °? Congestive heart failure. °? Diabetes. °? Kidney disease. °· Have HIV. °· Have sickle cell disease. °· Have had your spleen removed. °· Do not take good care of your teeth and mouth (poor dental hygiene). °· Have a medical condition that increases the risk of breathing in droplets from your own mouth and nose. °· Have a weakened body defense system (immune system). °· Are a smoker. °· Travel to areas where the germs that cause this illness are common. °· Are around certain animals or the places they live. °What are the signs or symptoms? °· A dry cough. °· A wet (productive) cough. °· Fever. °· Sweating. °· Chest pain. This often happens when breathing deeply or coughing. °· Fast breathing or trouble breathing. °· Shortness of breath. °· Shaking chills. °· Feeling tired (fatigue). °· Muscle aches. °How is this treated? °Treatment for this condition depends on many things. Most adults can be treated at home. In some cases, treatment must happen in a hospital. Treatment may include: °· Medicines given by mouth or through an IV tube. °· Being given extra oxygen. °· Respiratory therapy. °In rare cases, treatment for very bad pneumonia  may include: °· Using a machine to help you breathe. °· Having a procedure to remove fluid from around your lungs. °Follow these instructions at home: °Medicines °· Take over-the-counter and prescription medicines only as told by your doctor. °? Only take cough medicine if you are losing sleep. °· If you were prescribed an antibiotic medicine, take it as told by your doctor. Do not stop taking the antibiotic even if you start to feel better. °General instructions ° °· Sleep with your head and neck raised (elevated). You can do this by sleeping in a recliner or by putting a few pillows under your head. °· Rest as needed. Get at least 8 hours of sleep each night. °· Drink enough water to keep your pee (urine) pale yellow. °· Eat a healthy diet that includes plenty of vegetables, fruits, whole grains, low-fat dairy products, and lean protein. °· Do not use any products that contain nicotine or tobacco. These include cigarettes, e-cigarettes, and chewing tobacco. If you need help quitting, ask your doctor. °· Keep all follow-up visits as told by your doctor. This is important. °How is this prevented? °A shot (vaccine) can help prevent pneumonia. Shots are often suggested for: °· People older than 75 years of age. °· People older than 75 years of age who: °? Are having cancer treatment. °? Have long-term (chronic) lung disease. °? Have problems with their body's defense system. °You may also prevent pneumonia if you take these actions: °· Get the flu (influenza) shot every year. °· Go to the dentist as   often as told.  Wash your hands often. If you cannot use soap and water, use hand sanitizer. Contact a doctor if:  You have a fever.  You lose sleep because your cough medicine does not help. Get help right away if:  You are short of breath and it gets worse.  You have more chest pain.  Your sickness gets worse. This is very serious if: ? You are an older adult. ? Your body's defense system is weak.  You  cough up blood. Summary  Pneumonia is an infection of the lungs.  Most adults can be treated at home. Some will need treatment in a hospital.  Drink enough water to keep your pee pale yellow.  Get at least 8 hours of sleep each night. This information is not intended to replace advice given to you by your health care provider. Make sure you discuss any questions you have with your health care provider. Document Released: 02/01/2008 Document Revised: 04/12/2018 Document Reviewed: 04/12/2018 Elsevier Interactive Patient Education  2019 [employer]   Dehydration, Adult  Dehydration is when there is not enough fluid or water in your body. This happens when you lose more fluids than you take in. Dehydration can range from mild to very bad. It should be treated right away to keep it from getting very bad. Symptoms of mild dehydration may include:  Thirst.  Dry lips.  Slightly dry mouth.  Dry, warm skin.  Dizziness. Symptoms of moderate dehydration may include:  Very dry mouth.  Muscle cramps.  Dark pee (urine). Pee may be the color of tea.  Your body making less pee.  Your eyes making fewer tears.  Heartbeat that is uneven or faster than normal (palpitations).  Headache.  Light-headedness, especially when you stand up from sitting.  Fainting (syncope). Symptoms of very bad dehydration may include:  Changes in skin, such as: ? Cold and clammy skin. ? Blotchy (mottled) or pale skin. ? Skin that does not quickly return to normal after being lightly pinched and let go (poor skin turgor).  Changes in body fluids, such as: ? Feeling very thirsty. ? Your eyes making fewer tears. ? Not sweating when body temperature is high, such as in hot weather. ? Your body making very little pee.  Changes in vital signs, such as: ? Weak pulse. ? Pulse that is more than 100 beats a minute when you are sitting still. ? Fast breathing. ? Low blood pressure.  Other changes,  such as: ? Sunken eyes. ? Cold hands and feet. ? Confusion. ? Lack of energy (lethargy). ? Trouble waking up from sleep. ? Short-term weight loss. ? Unconsciousness. Follow these instructions at home:   If told by your doctor, drink an ORS: ? Make an ORS by using instructions on the package. ? Start by drinking small amounts, about  cup (120 mL) every 5-10 minutes. ? Slowly drink more until you have had the amount that your doctor said to have.  Drink enough clear fluid to keep your pee clear or pale yellow. If you were told to drink an ORS, finish the ORS first, then start slowly drinking clear fluids. Drink fluids such as: ? Water. Do not drink only water by itself. Doing that can make the salt (sodium) level in your body get too low (hyponatremia). ? Ice chips. ? Fruit juice that you have added water to (diluted). ? Low-calorie sports drinks.  Avoid: ? Alcohol. ? Drinks that have a lot of sugar. These include high-calorie  sports drinks, fruit juice that does not have water added, and soda. ? Caffeine. ? Foods that are greasy or have a lot of fat or sugar.  Take over-the-counter and prescription medicines only as told by your doctor.  Do not take salt tablets. Doing that can make the salt level in your body get too high (hypernatremia).  Eat foods that have minerals (electrolytes). Examples include bananas, oranges, potatoes, tomatoes, and spinach.  Keep all follow-up visits as told by your doctor. This is important. Contact a doctor if:  You have belly (abdominal) pain that: ? Gets worse. ? Stays in one area (localizes).  You have a rash.  You have a stiff neck.  You get angry or annoyed more easily than normal (irritability).  You are more sleepy than normal.  You have a harder time waking up than normal.  You feel: ? Weak. ? Dizzy. ? Very thirsty.  You have peed (urinated) only a small amount of very dark pee during 6-8 hours. Get help right away  if:  You have symptoms of very bad dehydration.  You cannot drink fluids without throwing up (vomiting).  Your symptoms get worse with treatment.  You have a fever.  You have a very bad headache.  You are throwing up or having watery poop (diarrhea) and it: ? Gets worse. ? Does not go away.  You have blood or something green (bile) in your throw-up.  You have blood in your poop (stool). This may cause poop to look black and tarry.  You have not peed in 6-8 hours.  You pass out (faint).  Your heart rate when you are sitting still is more than 100 beats a minute.  You have trouble breathing. This information is not intended to replace advice given to you by your health care provider. Make sure you discuss any questions you have with your health care provider. Document Released: 06/11/2009 Document Revised: 03/04/2016 Document Reviewed: 10/09/2015 Elsevier Interactive Patient Education  2019 [employer] Inc[employer]   Acute Kidney Injury, Adult  Acute kidney injury is a sudden worsening of kidney function. The kidneys are organs that have several jobs. They filter the blood to remove waste products and extra fluid. They also maintain a healthy balance of minerals and hormones in the body, which helps control blood pressure and keep bones strong. With this condition, your kidneys do not do their jobs as well as they should. This condition ranges from mild to severe. Over time it may develop into long-lasting (chronic) kidney disease. Early detection and treatment may prevent acute kidney injury from developing into a chronic condition. What are the causes? Common causes of this condition include:  A problem with blood flow to the kidneys. This may be caused by: ? Low blood pressure (hypotension) or shock. ? Blood loss. ? Heart and blood vessel (cardiovascular) disease. ? Severe burns. ? Liver disease.  Direct damage to the kidneys. This may be caused by: ? Certain medicines. ? A  kidney infection. ? Poisoning. ? Being around or in contact with toxic substances. ? A surgical wound. ? A hard, direct hit to the kidney area.  A sudden blockage of urine flow. This may be caused by: ? Cancer. ? Kidney stones. ? An enlarged prostate in males. What are the signs or symptoms? Symptoms of this condition may not be obvious until the condition becomes severe. Symptoms of this condition can include:  Tiredness (lethargy), or difficulty staying awake.  Nausea or vomiting.  Swelling (edema) of  the face, legs, ankles, or feet.  Problems with urination, such as: ? Abdominal pain, or pain along the side of your stomach (flank). ? Decreased urine production. ? Decrease in the force of urine flow.  Muscle twitches and cramps, especially in the legs.  Confusion or trouble concentrating.  Loss of appetite.  Fever. How is this diagnosed? This condition may be diagnosed with tests, including:  Blood tests.  Urine tests.  Imaging tests.  A test in which a sample of tissue is removed from the kidneys to be examined under a microscope (kidney biopsy). How is this treated? Treatment for this condition depends on the cause and how severe the condition is. In mild cases, treatment may not be needed. The kidneys may heal on their own. In more severe cases, treatment will involve:  Treating the cause of the kidney injury. This may involve changing any medicines you are taking or adjusting your dosage.  Fluids. You may need specialized IV fluids to balance your body's needs.  Having a catheter placed to drain urine and prevent blockages.  Preventing problems from occurring. This may mean avoiding certain medicines or procedures that can cause further injury to the kidneys. In some cases treatment may also require:  A procedure to remove toxic wastes from the body (dialysis or continuous renal replacement therapy - CRRT).  Surgery. This may be done to repair a torn  kidney, or to remove the blockage from the urinary system. Follow these instructions at home: Medicines  Take over-the-counter and prescription medicines only as told by your health care provider.  Do not take any new medicines without your health care provider's approval. Many medicines can worsen your kidney damage.  Do not take any vitamin and mineral supplements without your health care provider's approval. Many nutritional supplements can worsen your kidney damage. Lifestyle  If your health care provider prescribed changes to your diet, follow them. You may need to decrease the amount of protein you eat.  Achieve and maintain a healthy weight. If you need help with this, ask your health care provider.  Start or continue an exercise plan. Try to exercise at least 30 minutes a day, 5 days a week.  Do not use any tobacco products, such as cigarettes, chewing tobacco, and e-cigarettes. If you need help quitting, ask your health care provider. General instructions  Keep track of your blood pressure. Report changes in your blood pressure as told by your health care provider.  Stay up to date with immunizations. Ask your health care provider which immunizations you need.  Keep all follow-up visits as told by your health care provider. This is important. Where to find more information  American Association of Kidney Patients: BombTimer.gl  National Kidney Foundation: www.kidney.[place]: https://mathis.com/  [facility]: ? www.lifeoptions.org ? www.kidneyschool.org Contact a health care provider if:  Your symptoms get worse.  You develop new symptoms. Get help right away if:  You develop symptoms of worsening kidney disease, which include: ? Headaches. ? Abnormally dark or light skin. ? Easy bruising. ? Frequent hiccups. ? Chest pain. ? Shortness of breath. ? End of menstruation in women. ? Seizures. ? Confusion or altered mental  status. ? Abdominal or back pain. ? Itchiness.  You have a fever.  Your body is producing less urine.  You have pain or bleeding when you urinate. Summary  Acute kidney injury is a sudden worsening of kidney function.  Acute kidney injury can be caused by  problems with blood flow to the kidneys, direct damage to the kidneys, and sudden blockage of urine flow.  Symptoms of this condition may not be obvious until it becomes severe. Symptoms may include edema, lethargy, confusion, nausea or vomiting, and problems passing urine.  This condition can usually be diagnosed with blood tests, urine tests, and imaging tests. Sometimes a kidney biopsy is done to diagnose this condition.  Treatment for this condition often involves treating the underlying cause. It is treated with fluids, medicines, dialysis, diet changes, or surgery. This information is not intended to replace advice given to you by your health care provider. Make sure you discuss any questions you have with your health care provider. Document Released: 02/28/2011 Document Revised: 12/15/2016 Document Reviewed: 08/05/2016 Elsevier Interactive Patient Education  2019 [employer].   Dehydration  Dehydration is when there is not enough fluid or water in your body. This happens when you lose more fluids than you take in. People who are age 58 or older have a higher risk of getting dehydrated. Dehydration can range from mild to very bad. It should be treated right away to keep it from getting very bad. Symptoms of mild dehydration may include:  Thirst.  Dry lips.  Slightly dry mouth.  Dry, warm skin.  Dizziness. Symptoms of moderate dehydration may include:  Very dry mouth.  Muscle cramps.  Dark pee (urine). Pee may be the color of tea.  Your body making less pee.  Your eyes making fewer tears.  Heartbeat that is uneven or faster than normal (palpitations).  Headache.  Light-headedness, especially when you  stand up from sitting.  Fainting (syncope). Symptoms of very bad dehydration may include:  Changes in skin, such as: ? Cold and clammy skin. ? Blotchy (mottled) or pale skin. ? Skin that does not quickly return to normal after being lightly pinched and let go (poor skin turgor).  Changes in body fluids, such as: ? Feeling very thirsty. ? Your eyes making fewer tears. ? Not sweating when body temperature is high, such as in hot weather. ? Your body making very little pee.  Changes in vital signs, such as: ? Weak pulse. ? Pulse that is more than 100 beats a minute when you are sitting still. ? Fast breathing. ? Low blood pressure.  Other changes, such as: ? Sunken eyes. ? Cold hands and feet. ? Confusion. ? Lack of energy (lethargy). ? Trouble waking up from sleep. ? Short-term weight loss. ? Unconsciousness. Follow these instructions at home:   If told by your doctor, drink an ORS: ? Make an ORS by using instructions on the package. ? Start by drinking small amounts, about  cup (120 mL) every 5-10 minutes. ? Slowly drink more until you have had the amount that your doctor said to have.  Drink enough clear fluid to keep your pee clear or pale yellow. If you were told to drink an ORS, finish the ORS first, then start slowly drinking clear fluids. Drink fluids such as: ? Water. Do not drink only water by itself. Doing that can make the salt (sodium) level in your body get too low (hyponatremia). ? Ice chips. ? Fruit juice that you have added water to (diluted). ? Low-calorie sports drinks.  Avoid: ? Alcohol. ? Drinks that have a lot of sugar. These include high-calorie sports drinks, fruit juice that does not have water added, and soda. ? Caffeine. ? Foods that are greasy or have a lot of fat or sugar.  Take over-the-counter and prescription medicines only as told by your doctor.  Do not take salt tablets. Doing that can make the salt level in your body get too high  (hypernatremia).  Eat foods that have minerals (electrolytes). Examples include bananas, oranges, potatoes, tomatoes, and spinach.  Keep all follow-up visits as told by your doctor. This is important. Contact a doctor if:  You have belly (abdominal) pain that: ? Gets worse. ? Stays in one area (localizes).  You have a rash.  You have a stiff neck.  You get angry or annoyed more easily than normal (irritability).  You are more sleepy than normal.  You have a harder time waking up than normal.  You feel: ? Weak. ? Dizzy. ? Very thirsty. Get help right away if:  You have symptoms of very bad dehydration.  You cannot drink fluids without throwing up (vomiting).  Your symptoms get worse with treatment.  You have a fever.  You have a very bad headache.  You are throwing up or having watery poop (diarrhea) and it: ? Gets worse. ? Does not go away.  You have diarrhea for more than 24 hours.  You have blood or something green (bile) in your throw-up.  You have blood in your poop (stool). This may cause poop to look black and tarry.  You have not peed in 6-8 hours.  You have peed (urinated) only a small amount of very dark pee during 6-8 hours.  You pass out (faint).  Your heart rate when you are sitting still is more than 100 beats a minute.  You have trouble breathing. This information is not intended to replace advice given to you by your health care provider. Make sure you discuss any questions you have with your health care provider. Document Released: 08/04/2011 Document Revised: 03/04/2016 Document Reviewed: 10/09/2015 Elsevier Interactive Patient Education  2019 [place].   Rehydration Rehydration is the replacement of body fluids and salts and minerals (electrolytes) that are lost during dehydration. Dehydration is when there is not enough fluid or water in the body. This happens when you lose more fluids than you take in. People who are age 55 or  older have a higher risk of dehydration than younger adults. Common causes of dehydration include:  Vomiting.  Diarrhea.  Excessive sweating, such as from heat exposure or exercise.  Taking medicines that cause the body to lose excess fluid (diuretics).  Impaired kidney function.  Not drinking enough fluid.  Certain illnesses or infections.  Certain poorly controlled long-term (chronic) illnesses, such as diabetes, heart disease, and kidney disease.  Symptoms of mild dehydration may include thirst, dry lips and mouth, dry skin, and dizziness. Symptoms of severe dehydration may include increased heart rate, confusion, fainting, and not urinating. You can rehydrate by drinking certain fluids or getting fluids through an IV tube, as told by your health care provider. What are the risks? Generally, rehydration is safe. However, one problem that can happen is taking in too much fluid (overhydration). This is rare. If overhydration happens, it can cause an electrolyte imbalance, kidney failure, fluid in the lungs, or a decrease in salt (sodium) levels in the body. How to rehydrate Follow instructions from your health care provider for rehydration. The kind of fluid you should drink and the amount you should drink depend on your condition.  If directed by your health care provider, drink an oral rehydration solution (ORS). This is a drink designed to treat dehydration that is found in pharmacies  and retail stores. ? Make an ORS by following instructions on the package. ? Start by drinking small amounts, about  cup (120 mL) every 5-10 minutes. ? Slowly increase how much you drink until you have taken the amount recommended by your health care provider.  Drink enough clear fluids to keep your urine clear or pale yellow. If you were instructed to drink an ORS, finish the ORS first, then start slowly drinking other clear fluids. Drink fluids such as: ? Water. Do not drink only water. Doing that  can lead to having too little sodium in your body (hyponatremia). ? Ice chips. ? Fruit juice that you have added water to (diluted juice). ? Low-calorie sports drinks.  If you are severely dehydrated, your health care provider may recommend that you receive fluids through an IV tube in the hospital.  Do not take sodium tablets. Doing that can lead to the condition of having too much sodium in your body (hypernatremia). Eating while you rehydrate Follow instructions from your health care provider about what to eat while you rehydrate. Your health care provider may recommend that you slowly begin eating regular foods in small amounts.  Eat foods that contain a healthy balance of electrolytes, such as bananas, oranges, potatoes, tomatoes, and spinach.  Avoid foods that are greasy or contain a lot of fat or sugar.  In some cases, you may get nutrition through a feeding tube that is passed through your nose and into your stomach (nasogastric tube, or NG tube). This may be done if you have uncontrolled vomiting or diarrhea. Beverages to avoid Certain beverages may make dehydration worse. While you rehydrate, avoid:  Alcohol.  Caffeine.  Drinks that contain a lot of sugar. These include: ? High-calorie sports drinks. ? Fruit juice that is not diluted. ? Soda.  Check nutrition labels to see how much sugar or caffeine a beverage contains. Signs of dehydration recovery You may be recovering from dehydration if:  You urinate more often than you did before you started rehydrating.  Your urine is clear or pale yellow.  Your energy level improves.  You vomit less frequently.  You have diarrhea less frequently.  Your appetite improves or returns to normal.  You feel less dizzy or less light-headed.  Your skin tone and color start to look more normal. Contact a health care provider if:  You continue to have symptoms of mild dehydration, such as: ? Thirst. ? Dry lips. ? Slightly  dry mouth. ? Dry, warm skin. ? Dizziness.  You continue to vomit or have diarrhea. Get help right away if:  You have symptoms of dehydration that get worse.  You feel: ? Confused. ? Weak. ? Like you are going to faint.  You have not urinated in 6-8 hours.  You have very dark urine.  You have trouble breathing.  Your heart rate while sitting still is over 100 beats a minute.  You cannot drink fluids without vomiting.  You have vomiting or diarrhea that: ? Gets worse. ? Does not go away.  You have a fever. This information is not intended to replace advice given to you by your health care provider. Make sure you discuss any questions you have with your health care provider. Document Released: 11/07/2011 Document Revised: 03/04/2016 Document Reviewed: 10/09/2015 Elsevier Interactive Patient Education  2019 [employer].

## 2018-11-15 NOTE — Discharge Summary (Signed)
Discharge Summary  [name] PPJ:[id number] DOB: [birth year]  PCP: Nolene Ebbs, MD  Admit date: 11/12/2018 Discharge date: 11/15/2018  Time spent: 35 minutes  Recommendations for Outpatient Follow-up:  1. Follow-up with your primary care provider 2. Take your medications as prescribed 3. Continue physical therapy at SNF 4. Fall precautions  Discharge Diagnoses:  Active Hospital Problems   Diagnosis Date Noted   COPD exacerbation ([place]) 01/28/2011   Malnutrition of moderate degree 11/15/2018   Type II diabetes mellitus with renal manifestations (HCC) 11/12/2018   Tobacco abuse 11/12/2018   Generalized weakness 11/12/2018   Chronic respiratory failure with hypoxia (HCC) 11/12/2018   Chronic diastolic congestive heart failure ([place])    Hyperlipidemia 02/10/2015   Cirrhosis of liver ([place]) 11/17/2012   Atypical chest pain- no history of CAD. Myoview low risk 2012 11/15/2012   Acute renal failure superimposed on stage 3 chronic kidney disease ([place]) 06/16/2012   Anemia 01/28/2011    Resolved Hospital Problems  No resolved problems to display.    Discharge Condition: Stable  Diet recommendation: Resume previous diet  Vitals:   11/15/18 0044 11/15/18 0731  BP: 120/64 118/77  Pulse: 80 86  Resp:    Temp:  98.1 F (36.7 C)  SpO2:  99%    History of present illness:   Regina Laffey Youngis an 73 y.o.femalewith medical history significant ofhypertension, hyperlipidemia, diabetes mellitus, COPD on 2 L nasal cannula oxygen at home, CKD stage III, GI bleeding, DVT not on anticoagulants, alcoholic cirrhosis, alcohol abuse in remission,dCHF, diverticular GI bleeding, tobacco abuse, anemia, pork allergy, who presents with generalized weakness, cough, chest pain.  Admitted for community-acquired pneumonia.  Hospital course complicated by AKI on CKD 3 which improved with IV hydration.  11/15/18: Patient seen and examined at bedside.  No acute events overnight.  She  has no new complaint this morning.  She denies any dyspnea or chest pain.  On the day of discharge, the patient was hemodynamically stable.  She will need to follow-up with her primary care provider and continue physical therapy at SNF.  Fall precautions.  Hospital Course:  Principal Problem:   COPD exacerbation ([place]) Active Problems:   Anemia   Acute renal failure superimposed on stage 3 chronic kidney disease (HCC)   Atypical chest pain- no history of CAD. Myoview low risk 2012   Cirrhosis of liver (HCC)   Hyperlipidemia   Chronic diastolic congestive heart failure (HCC)   Type II diabetes mellitus with renal manifestations (HCC)   Tobacco abuse   Generalized weakness   Chronic respiratory failure with hypoxia (HCC)   Malnutrition of moderate degree  Resolving right upper lobe community-acquired pneumonia, present admission Completed IV Rocephin and azithromycin x3 days Continue cefdinir x4 days Follow-up with your PCP O2 sat on 11/15/2018 was 99%  Resolving AKI on CKD 3 Baseline creatinine is 1.51 with GFR of 39 Presented with creatinine of 2.58 Creatinine today on 11/15/2018 is 1.88 from 2.20 on 11/14/2018  Follow-up with your primary care provider Avoid dehydration Stop po Lasix  Type 2 diabetes with hyperglycemia A1c on 11/13/2018 was 8.4 Stop glimepiride and Januvia due to low GFR Continue Lantus 10 unit in the morning Follow-up with your PCP  Chronic normocytic anemia Baseline hemoglobin appears to be 9 HemoGlobin stable at 7.9 No sign of overt bleeding Continue ferrous sulfate Follow-up with your PCP  Generalized weakness/physical debility/ambulatory dysfunction PT OT recommended SNF CSW consulted for placement Fall precautions Continue physical therapy as tolerated    Code  Status:Full Code.      Discharge Exam: BP 118/77 (BP Location: Left Arm)    Pulse 86    Temp 98.1 F (36.7 C)    Resp 20    Ht 5' (1.524 m)    Wt 51.2 kg    SpO2 99%    BMI  22.03 kg/m   General: 73 y.o. year-old female well developed well nourished in no acute distress.  Alert and interactive.  Cardiovascular: Regular rate and rhythm with no rubs or gallops.  No thyromegaly or JVD noted.    Respiratory: Clear to auscultation with no wheezes or rales. Good inspiratory effort.  Abdomen: Soft nontender nondistended with normal bowel sounds x4 quadrants.  Musculoskeletal: No lower extremity edema. 2/4 pulses in all 4 extremities.  Psychiatry: Mood is appropriate for condition and setting  Discharge Instructions You were cared for by a hospitalist during your hospital stay. If you have any questions about your discharge medications or the care you received while you were in the hospital after you are discharged, you can call the unit and asked to speak with the hospitalist on call if the hospitalist that took care of you is not available. Once you are discharged, your primary care physician will handle any further medical issues. Please note that NO REFILLS for any discharge medications will be authorized once you are discharged, as it is imperative that you return to your primary care physician (or establish a relationship with a primary care physician if you do not have one) for your aftercare needs so that they can reassess your need for medications and monitor your lab values.   Allergies as of 11/15/2018      Reactions   Compazine [prochlorperazine Edisylate] Other (See Comments)   Altered mental status   Penicillins Hives   Has patient had a PCN reaction causing immediate rash, facial/tongue/throat swelling, SOB or lightheadedness with hypotension: Yes Has patient had a PCN reaction causing severe rash involving mucus membranes or skin necrosis: No Has patient had a PCN reaction that required hospitalization: No Has patient had a PCN reaction occurring within the last 10 years: No If all of the above answers are "NO", then may proceed with Cephalosporin use.    Phenergan [promethazine Hcl] Other (See Comments)   Altered mental status   Lipitor [atorvastatin] Swelling   Body swells, but doesn't affect breathing   Chocolate Diarrhea   Lactose Intolerance (gi) Diarrhea   Other Other (See Comments)   Nuts- Diverticulitis   Pork-derived Products Other (See Comments)   Causes GOUT      Medication List    STOP taking these medications   furosemide 40 MG tablet Commonly known as:  LASIX   glimepiride 4 MG tablet Commonly known as:  AMARYL   Januvia 100 MG tablet Generic drug:  sitaGLIPtin     TAKE these medications   acetaminophen 325 MG tablet Commonly known as:  TYLENOL Take 2 tablets (650 mg total) by mouth every 6 (six) hours as needed for mild pain, fever or headache (or Fever >/= 101).   albuterol 108 (90 Base) MCG/ACT inhaler Commonly known as:  PROVENTIL HFA;VENTOLIN HFA Inhale 2 puffs into the lungs every 6 (six) hours as needed. For shortness of breath   albuterol (2.5 MG/3ML) 0.083% nebulizer solution Commonly known as:  PROVENTIL Take 2.5 mg by nebulization every 6 (six) hours as needed for wheezing or shortness of breath.   allopurinol 100 MG tablet Commonly known as:  ZYLOPRIM Take 100  mg by mouth daily.   aspirin 81 MG EC tablet Take 1 tablet (81 mg total) by mouth daily with breakfast.   azithromycin 250 MG tablet Commonly known as:  ZITHROMAX Please take 250 mg daily x4 days   cefdinir 300 MG capsule Commonly known as:  OMNICEF Take 1 capsule (300 mg total) by mouth daily for 4 days.   cilostazol 100 MG tablet Commonly known as:  PLETAL Take 50 mg by mouth 2 (two) times daily.   diclofenac sodium 1 % Gel Commonly known as:  VOLTAREN Apply 2 g topically 4 (four) times daily.   ergocalciferol 1.25 MG (50000 UT) capsule Commonly known as:  VITAMIN D2 Take 50,000 Units by mouth every Monday. On Monday   ferrous sulfate 325 (65 FE) MG tablet Take 325 mg by mouth daily with breakfast.   fluticasone  50 MCG/ACT nasal spray Commonly known as:  FLONASE Place 2 sprays into the nose daily as needed for allergies.   folic acid 1 MG tablet Commonly known as:  FOLVITE Take 1 tablet (1 mg total) by mouth daily.   hydrocortisone 25 MG suppository Commonly known as:  ANUSOL-HC Place 1 suppository (25 mg total) rectally 2 (two) times daily as needed for hemorrhoids or anal itching.   Lantus SoloStar 100 UNIT/ML Solostar Pen Generic drug:  Insulin Glargine Inject 10 Units into the skin every morning. What changed:  when to take this   magnesium oxide 400 MG tablet Commonly known as:  MAG-OX Take 400 mg by mouth daily.   metoprolol tartrate 50 MG tablet Commonly known as:  LOPRESSOR Take 1 tablet (50 mg total) by mouth 2 (two) times daily. What changed:  how much to take   montelukast 10 MG tablet Commonly known as:  SINGULAIR Take 10 mg by mouth at bedtime.   nicotine 21 mg/24hr patch Commonly known as:  NICODERM CQ - dosed in mg/24 hours Place 1 patch (21 mg total) onto the skin daily.   omeprazole 40 MG capsule Commonly known as:  PRILOSEC Take 40 mg by mouth daily.   oxyCODONE 5 MG immediate release tablet Commonly known as:  Oxy IR/ROXICODONE Take 1-2 tablets (5-10 mg total) by mouth every 6 (six) hours as needed for moderate pain or severe pain.   OXYGEN Inhale 2 L into the lungs at bedtime.   simvastatin 10 MG tablet Commonly known as:  ZOCOR Take 10 mg by mouth daily.   thiamine 100 MG tablet Take 1 tablet (100 mg total) by mouth daily.   triamcinolone ointment 0.1 % Commonly known as:  KENALOG Apply 1 application topically at bedtime.      Allergies  Allergen Reactions   Compazine [Prochlorperazine Edisylate] Other (See Comments)    Altered mental status   Penicillins Hives    Has patient had a PCN reaction causing immediate rash, facial/tongue/throat swelling, SOB or lightheadedness with hypotension: Yes Has patient had a PCN reaction causing severe  rash involving mucus membranes or skin necrosis: No Has patient had a PCN reaction that required hospitalization: No Has patient had a PCN reaction occurring within the last 10 years: No If all of the above answers are "NO", then may proceed with Cephalosporin use.    Phenergan [Promethazine Hcl] Other (See Comments)    Altered mental status   Lipitor [Atorvastatin] Swelling    Body swells, but doesn't affect breathing   Chocolate Diarrhea   Lactose Intolerance (Gi) Diarrhea   Other Other (See Comments)    Nuts-  Diverticulitis   Pork-Derived Products Other (See Comments)    Causes GOUT   Follow-up Information    Nolene Ebbs, MD. Call in 1 day(s).   Specialty:  Internal Medicine Why:  Please call for a post hospital follow-up appointment. Contact information: [place] [address] [phone]            The results of significant diagnostics from this hospitalization (including imaging, microbiology, ancillary and laboratory) are listed below for reference.    Significant Diagnostic Studies: Dg Chest 2 View  Result Date: 11/12/2018 CLINICAL DATA:  Weakness. EXAM: CHEST - 2 VIEW COMPARISON:  08/28/2018 FINDINGS: Lungs are hyperexpanded. Interstitial markings are diffusely coarsened with chronic features. Hazy opacity noted in the right apex. No pleural effusion or evidence of pulmonary edema. The cardiopericardial silhouette is within normal limits for size. Bones are diffusely demineralized. Telemetry leads overlie the chest. Multiple thoracic compression fractures evident. IMPRESSION: 1. Hyperexpansion with underlying chronic interstitial changes. 2. Hazy opacity in the right lung apex, indeterminate but pneumonia not excluded. Follow-up recommended to ensure resolution. Electronically Signed   By: Misty Stanley M.D.   On: 11/12/2018 17:14   Ct Head Wo Contrast  Result Date: 11/12/2018 CLINICAL DATA:  Altered mental status EXAM: CT HEAD WITHOUT  CONTRAST TECHNIQUE: Contiguous axial images were obtained from the base of the skull through the vertex without intravenous contrast. Sagittal and coronal MPR images reconstructed from axial data set. COMPARISON:  08/28/2018 FINDINGS: Brain: Normal ventricular morphology. No midline shift or mass effect. Mild small vessel chronic ischemic changes of deep cerebral white matter. No intracranial hemorrhage, mass lesion or evidence acute infarction. No extra-axial fluid collections. Vascular: Atherosclerotic calcification of internal carotid and vertebral arteries at skull base Skull: Demineralized but intact Sinuses/Orbits: Deformity and chronic opacification of RIGHT maxillary sinus likely sequela of remote trauma. Remaining paranasal sinuses and mastoid air cells clear. Other: N/A IMPRESSION: Atrophy with mild small vessel chronic ischemic changes of deep cerebral white matter. No acute intracranial abnormalities. Electronically Signed   By: Lavonia Dana M.D.   On: 11/12/2018 18:24    Microbiology: Recent Results (from the past 240 hour(s))  Culture, blood (routine x 2) Call MD if unable to obtain prior to antibiotics being given     Status: None (Preliminary result)   Collection Time: 11/12/18 11:42 PM  Result Value Ref Range Status   Specimen Description BLOOD LEFT ARM  Final   Special Requests   Final    BOTTLES DRAWN AEROBIC AND ANAEROBIC Blood Culture adequate volume   Culture   Final    NO GROWTH 1 DAY Performed at [facility], [address] [place], [place] [address]    Report Status PENDING  Incomplete  Culture, blood (Routine X 2) w Reflex to ID Panel     Status: None (Preliminary result)   Collection Time: 11/13/18  5:51 AM  Result Value Ref Range Status   Specimen Description BLOOD LEFT HAND  Final   Special Requests   Final    BOTTLES DRAWN AEROBIC AND ANAEROBIC Blood Culture adequate volume   Culture   Final    NO GROWTH 1 DAY Performed at [facility], [place]  [address] [place], [place] [address]    Report Status PENDING  Incomplete     Labs: Basic Metabolic Panel: Recent Labs  Lab 11/12/18 1719 11/13/18 1209 11/14/18 0227 11/15/18 0304  NA 134* 139 136  --   K 4.5 4.3 4.3  --  CL 106 112* 112*  --   CO2 22 21* 19*  --   GLUCOSE 293* 120* 146*  --   BUN 50* 45* 42*  --   CREATININE 2.58* 2.14* 2.20* 1.88*  CALCIUM 8.4* 8.1* 7.8*  --    Liver Function Tests: Recent Labs  Lab 11/12/18 1719  AST 26  ALT 11  ALKPHOS 281*  BILITOT 0.2*  PROT 5.8*  ALBUMIN 2.1*   No results for input(s): LIPASE, AMYLASE in the last 168 hours. Recent Labs  Lab 11/12/18 2339  AMMONIA 33   CBC: Recent Labs  Lab 11/12/18 1719 11/14/18 0227 11/14/18 1123 11/15/18 0304  WBC 14.2* 13.7*  --   --   HGB 8.3* 6.8* 7.5* 7.9*  HCT 28.1* 23.1* 24.8* 26.8*  MCV 85.4 84.9  --   --   PLT 418* 357  --   --    Cardiac Enzymes: Recent Labs  Lab 11/12/18 2339 11/13/18 0358 11/13/18 0919  TROPONINI <0.03 <0.03 <0.03   BNP: BNP (last 3 results) Recent Labs    11/12/18 2339  BNP 192.9*    ProBNP (last 3 results) No results for input(s): PROBNP in the last 8760 hours.  CBG: Recent Labs  Lab 11/14/18 0740 11/14/18 1153 11/14/18 1733 11/14/18 2108 11/15/18 0731  GLUCAP 71 112* 171* 216* 86       Signed:  Kayleen Memos, MD [facility] 11/15/2018, 9:26 AM

## 2018-11-15 NOTE — Progress Notes (Signed)
Occupational Therapy Treatment Patient Details Name: [name] MRN: [id number] DOB: 07-05-1946 Today's Date: 11/15/2018    History of present illness [name] is an 73 y.o. female with medical history significant of hypertension, hyperlipidemia, diabetes mellitus, COPD on 2 L nasal cannula oxygen at home, CKD stage III, GI bleeding, DVT not on anticoagulants, alcoholic cirrhosis, alcohol abuse in remission, dCHF, diverticular GI bleeding, tobacco abuse, anemia, pork allergy, who presents with generalized weakness, cough, chest pain   OT comments  Pt awoken from slumber and agreeable to therapy. Pt modA for bed mobility as pt lethargic today. Pt performing mobility with RW to 3in1 with modA for stability with toilet hygiene. Pt standing for grooming tasks with minguardA for stability with Rw. Pt ambulating in room with RW and minA to avoid obstacles. Pt continues to require increased assist for ADL and mobility. Pt would benefit from continued OT skilled services for ADL. Mobility and safety in SNF setting. OT to follow acutely.    Follow Up Recommendations  SNF;Supervision/Assistance - 24 hour    Equipment Recommendations       Recommendations for Other Services      Precautions / Restrictions Precautions Precautions: Fall Restrictions Weight Bearing Restrictions: No       Mobility Bed Mobility Overal bed mobility: Needs Assistance Bed Mobility: Supine to Sit     Supine to sit: Mod assist     General bed mobility comments: trunk elevation and swinging hips using pad  to EOB  Transfers Overall transfer level: Needs assistance Equipment used: Rolling walker (2 wheeled) Transfers: Sit to/from Stand Sit to Stand: Mod assist         General transfer comment: modA +1 for stability with RW    Balance Overall balance assessment: Needs assistance   Sitting balance-Leahy Scale: Fair       Standing balance-Leahy Scale: Poor                              ADL either performed or assessed with clinical judgement   ADL Overall ADL's : Needs assistance/impaired                                     Functional mobility during ADLs: Minimal assistance;Rolling walker;Cueing for safety;Cueing for sequencing General ADL Comments: Pt performing toilet hygiene with modA for stability and for task; pt stood at sink to wash hands with minguardA for stability.     Vision   Vision Assessment?: No apparent visual deficits   Perception     Praxis      Cognition Arousal/Alertness: Lethargic Behavior During Therapy: WFL for tasks assessed/performed Overall Cognitive Status: Impaired/Different from baseline Area of Impairment: Memory;Following commands;Safety/judgement                     Memory: Decreased short-term memory Following Commands: Follows one step commands consistently       General Comments: distracted often; wanting to leave the room to go to "new bed."        Exercises     Shoulder Instructions       General Comments      Pertinent Vitals/ Pain       Pain Assessment: No/denies pain  Home Living  Prior Functioning/Environment              Frequency  Min 2X/week        Progress Toward Goals  OT Goals(current goals can now be found in the care plan section)  Progress towards OT goals: Progressing toward goals  Acute Rehab OT Goals Patient Stated Goal: to go to my new bed OT Goal Formulation: With patient Time For Goal Achievement: 11/27/18 Potential to Achieve Goals: Good ADL Goals Pt Will Perform Lower Body Bathing: with supervision;sit to/from stand Pt Will Perform Lower Body Dressing: with supervision;sit to/from stand Pt Will Transfer to Toilet: with modified independence;ambulating;bedside commode Pt Will Perform Toileting - Clothing Manipulation and hygiene: with modified independence;sit to/from stand   Plan Discharge plan remains appropriate    Co-evaluation                 AM-PAC OT "6 Clicks" Daily Activity     Outcome Measure   Help from another person eating meals?: None Help from another person taking care of personal grooming?: A Little Help from another person toileting, which includes using toliet, bedpan, or urinal?: A Little Help from another person bathing (including washing, rinsing, drying)?: A Little Help from another person to put on and taking off regular upper body clothing?: A Little Help from another person to put on and taking off regular lower body clothing?: A Lot 6 Click Score: 18    End of Session Equipment Utilized During Treatment: Gait belt;Rolling walker  OT Visit Diagnosis: Unsteadiness on feet (R26.81);Muscle weakness (generalized) (M62.81)   Activity Tolerance Patient tolerated treatment well   Patient Left in bed;with call bell/phone within reach;with bed alarm set   Nurse Communication Mobility status        Time: 3295-1884 OT Time Calculation (min): 17 min  Charges: OT General Charges $OT Visit: 1 Visit OT Treatments $Self Care/Home Management : 8-22 mins  Darryl Nestle) Marsa Aris OTR/L Acute Rehabilitation Services Pager: [phone] Office: [phone]    Fredda Hammed 11/15/2018, 4:05 PM

## 2018-11-15 NOTE — Progress Notes (Signed)
CBG check revealed blood sugar of 50 at 1129. Patient encouraged to drink orange juice. Recheck at 1148 revealed blood sugar of 63. Patient encouraged to drink more with assistance. Recheck at 1221 revealed blood sugar of 46. Only symptom reported was nausea. At this point, patient virtually asypmtomatic. On suspicion of error, CBG recheck at 1222 was 42. Blood taken from both hands, with both hands cool to touch and dilute-looking blood. 1/2 amp D50 given and recheck at 1250 revealed blood sugar of 121. Patient resting comfortably and was able to have a successful bowel movement throughout this endeavor. Will continue to monitor.  Kinnie Scales, RN 11/15/18 1:07 PM

## 2018-11-16 LAB — GLUCOSE, CAPILLARY
Glucose-Capillary: 107 mg/dL — ABNORMAL HIGH (ref 70–99)
Glucose-Capillary: 119 mg/dL — ABNORMAL HIGH (ref 70–99)
Glucose-Capillary: 54 mg/dL — ABNORMAL LOW (ref 70–99)
Glucose-Capillary: 60 mg/dL — ABNORMAL LOW (ref 70–99)
Glucose-Capillary: 91 mg/dL (ref 70–99)
Glucose-Capillary: 162 mg/dL — ABNORMAL HIGH (ref 70–99)

## 2018-11-16 MED ORDER — BISACODYL 10 MG RE SUPP: 10.0000 mg | Freq: Once | RECTAL | Status: DC

## 2018-11-16 MED ORDER — GLUCOSE 40 % PO GEL
1.0000 | Freq: Once | ORAL | Status: AC | PRN
  Administered 2018-11-16: 37.5 g via ORAL
  Filled 2018-11-16: qty 1

## 2018-11-16 MED ORDER — INSULIN ASPART 100 UNIT/ML ~~LOC~~ SOLN
0.0000 [IU] | Freq: Three times a day (TID) | SUBCUTANEOUS | Status: DC
  Administered 2018-11-17: 2 [IU] via SUBCUTANEOUS
  Administered 2018-11-17: 5 [IU] via SUBCUTANEOUS

## 2018-11-16 MED ORDER — INSULIN GLARGINE 100 UNIT/ML ~~LOC~~ SOLN
7.0000 [IU] | Freq: Every day | SUBCUTANEOUS | Status: DC
  Filled 2018-11-16: qty 0.07

## 2018-11-16 NOTE — TOC Progression Note (Signed)
Transition of Care [facility]) - Progression Note    Patient Details  Name: LUVERN MISCHKE MRN: [id number] Date of Birth: Sep 14, 1945  [facility] CM/SW Contact  Eileen Stanford, LCSW Phone Number: 11/16/2018, 12:56 PM  Clinical Narrative:    [facility] is not sure the DON will approve pt for a Medicaid bed therefore CSW spoke with pt about a alternative placement. Pt is agreeable to [place] because she has been there before. Tamisha pt's CNA also updated. Once bed availability is determined CSW will contact pt's brother. Admission coordinator at [facility] is looking at pt referral now.    Expected Discharge Plan: Skilled Nursing Facility Barriers to Discharge: Ship broker, Continued Medical Work up  Expected Discharge Plan and Services Expected Discharge Plan: [place] Choice: [place] Living arrangements for the past 2 months: [place], [place] Expected Discharge Date: 11/16/18                         Social Determinants of Health (SDOH) Interventions    Readmission Risk Interventions Readmission Risk Prevention Plan 11/15/2018  Transportation Screening Complete  Medication Review Press photographer) Complete  PCP or Specialist appointment within 3-5 days of discharge Complete  HRI or [place] Not Complete  HRI or Home Care Consult Pt Refusal Comments N/A  SW Recovery Care/Counseling Consult Complete  Palliative Care Screening Not Complete  Comments N/A  Skilled Nursing Facility Complete  Some recent data might be hidden

## 2018-11-16 NOTE — TOC Progression Note (Signed)
Transition of Care [facility]) - Progression Note    Patient Details  Name: [name] MRN: [id number] Date of Birth: 11-23-1945  Transition of Care [facility]) CM/SW [place], [place] Phone Number: 11/16/2018, 4:24 PM  Clinical Narrative: Patient denied at [facility], but [facility] has offered a bed for the patient. [place] [facility]us has initiated Ship broker. CSW attempted to contact patient's caregiver Arline Asp and patient's brothers; left messages, unable to discuss with anyone. Insurance authorization has still not been received at this time.      Expected Discharge Plan: Skilled Nursing Facility Barriers to Discharge: Ship broker, Continued Medical Work up  Expected Discharge Plan and Services Expected Discharge Plan: [place] Choice: [place] Living arrangements for the past 2 months: [place], [place] Expected Discharge Date: 11/16/18                         Social Determinants of Health (SDOH) Interventions    Readmission Risk Interventions Readmission Risk Prevention Plan 11/15/2018  Transportation Screening Complete  Medication Review Press photographer) Complete  PCP or Specialist appointment within 3-5 days of discharge Complete  HRI or [place] Not Complete  HRI or Home Care Consult Pt Refusal Comments N/A  SW Recovery Care/Counseling Consult Complete  Palliative Care Screening Not Complete  Comments N/A  Skilled Nursing Facility Complete  Some recent data might be hidden

## 2018-11-16 NOTE — Progress Notes (Signed)
Physical Therapy Treatment Patient Details Name: [name] MRN: [id number] DOB: [birth year] Today's Date: 11/16/2018    History of Present Illness [name] is an 73 y.o. female with medical history significant of hypertension, hyperlipidemia, diabetes mellitus, COPD on 2 L nasal cannula oxygen at home, CKD stage III, GI bleeding, DVT not on anticoagulants, alcoholic cirrhosis, alcohol abuse in remission, dCHF, diverticular GI bleeding, tobacco abuse, anemia, pork allergy, who presents with generalized weakness, cough, chest pain    PT Comments    Patient progressing towards goals. Patient ambulated with minA and RW requiring 1 seated rest break secondary to fatigue. Vitals WFL on RA during functional mobility. Current recommendation remains appropriate. Patient will benefit from acute physical therapy to maximize independence and safety with functional mobility.    Follow Up Recommendations  SNF;Supervision/Assistance - 24 hour     Equipment Recommendations  None recommended by PT    Recommendations for Other Services       Precautions / Restrictions Precautions Precautions: Fall Restrictions Weight Bearing Restrictions: No    Mobility  Bed Mobility               General bed mobility comments: patient in chair upon arrival  Transfers Overall transfer level: Needs assistance Equipment used: Rolling walker (2 wheeled) Transfers: Sit to/from Stand Sit to Stand: Min assist         General transfer comment: Required minA for lift assit to stand with RW x2. Verbal cues cues for safe hand placement prior to standing when using RW.   Ambulation/Gait Ambulation/Gait assistance: Min assist   Assistive device: Standard walker Gait Pattern/deviations: Step-through pattern;Decreased stride length Gait velocity: decreased   General Gait Details: Patient ambulated with minA for steadying and RW. Required 1 seated rest break secondary to fatigue. Vitals WFL  during ambulation.    Stairs             Wheelchair Mobility    Modified Rankin (Stroke Patients Only)       Balance Overall balance assessment: Needs assistance Sitting-balance support: No upper extremity supported;Feet supported Sitting balance-Leahy Scale: Good       Standing balance-Leahy Scale: Fair Standing balance comment: Able to maintain static standing balance without UE support. Reliant on RW during gait.                            Cognition Arousal/Alertness: Awake/alert Behavior During Therapy: WFL for tasks assessed/performed Overall Cognitive Status: No family/caregiver present to determine baseline cognitive functioning                                 General Comments: Patient able to follow commands. Able to locate room in hallway during ambulation.      Exercises General Exercises - Lower Extremity Ankle Circles/Pumps: AROM;10 reps;Seated    General Comments        Pertinent Vitals/Pain Pain Assessment: Faces Faces Pain Scale: No hurt    Home Living                      Prior Function            PT Goals (current goals can now be found in the care plan section) Acute Rehab PT Goals Patient Stated Goal: "eat my breakfast" PT Goal Formulation: With patient Time For Goal Achievement: 11/27/18 Potential to Achieve Goals: Good Progress towards  PT goals: Progressing toward goals    Frequency    Min 2X/week      PT Plan Current plan remains appropriate    Co-evaluation              AM-PAC PT "6 Clicks" Mobility   Outcome Measure  Help needed turning from your back to your side while in a flat bed without using bedrails?: A Little Help needed moving from lying on your back to sitting on the side of a flat bed without using bedrails?: A Little Help needed moving to and from a bed to a chair (including a wheelchair)?: A Little Help needed standing up from a chair using your arms (e.g.,  wheelchair or bedside chair)?: A Little Help needed to walk in hospital room?: A Little Help needed climbing 3-5 steps with a railing? : A Lot 6 Click Score: 17    End of Session Equipment Utilized During Treatment: Gait belt Activity Tolerance: Patient tolerated treatment well Patient left: in chair;with call bell/phone within reach;with chair alarm set Nurse Communication: Mobility status PT Visit Diagnosis: Unsteadiness on feet (R26.81);Muscle weakness (generalized) (M62.81);Difficulty in walking, not elsewhere classified (R26.2)     Time: 8118-8677 PT Time Calculation (min) (ACUTE ONLY): 21 min  Charges:  $Gait Training: 8-22 mins                     Erick Blinks, SPT  Erick Blinks 11/16/2018, 2:15 PM

## 2018-11-16 NOTE — Progress Notes (Signed)
Daily Nurse Note  Introduced self to miss Supinski who vocalized that she would be discharged today. Followed up with social work who endorsed the patient has lost her medicaid bed at [facility] and that she would need placement at another one.  This is presently in process. Patient endorsed an upset stomach in the afternoon, she had an active large volume stool. Poor PO in afternoon BS 54 on evening check given orange juice with response to 60, MD ordered oral glucose though BS improved to 91 after two orange juice cartons. All patient needs met during shift.

## 2018-11-16 NOTE — Discharge Summary (Signed)
Discharge Summary  [name] DDU:[id number] DOB: 02-27-1946  PCP: Nolene Ebbs, MD  Admit date: 11/12/2018 Discharge date: 11/16/2018  Time spent: 35 minutes  DC delayed due to pending insurance authorization.  Recommendations for Outpatient Follow-up:  1. Follow-up with your primary care provider 2. Take your medications as prescribed 3. Continue physical therapy at SNF 4. Fall precautions  Discharge Diagnoses:  Active Hospital Problems   Diagnosis Date Noted   COPD exacerbation ([place]) 01/28/2011   Malnutrition of moderate degree 11/15/2018   Type II diabetes mellitus with renal manifestations (HCC) 11/12/2018   Tobacco abuse 11/12/2018   Generalized weakness 11/12/2018   Chronic respiratory failure with hypoxia (HCC) 11/12/2018   Chronic diastolic congestive heart failure ([place])    Hyperlipidemia 02/10/2015   Cirrhosis of liver ([place]) 11/17/2012   Atypical chest pain- no history of CAD. Myoview low risk 2012 11/15/2012   Acute renal failure superimposed on stage 3 chronic kidney disease ([place]) 06/16/2012   Anemia 01/28/2011    Resolved Hospital Problems  No resolved problems to display.    Discharge Condition: Stable  Diet recommendation: Resume previous diet  Vitals:   11/15/18 2336 11/16/18 0736  BP: 134/87 92/70  Pulse: 98 89  Resp: 10 17  Temp: 98.7 F (37.1 C) 97.8 F (36.6 C)  SpO2: 100% 100%    History of present illness:   Marlee Armenteros Youngis an 73 y.o.femalewith medical history significant ofhypertension, hyperlipidemia, diabetes mellitus, COPD on 2 L nasal cannula oxygen at home, CKD stage III, GI bleeding, DVT not on anticoagulants, alcoholic cirrhosis, alcohol abuse in remission,dCHF, diverticular GI bleeding, tobacco abuse, anemia, pork allergy, who presents with generalized weakness, cough, chest pain.  Admitted for community-acquired pneumonia.  Hospital course complicated by AKI on CKD 3 which improved with IV  hydration.  11/16/18: Seen and examined. No acute events overnight. No new complaints. She wants to go to SNF. Vital signs and previous labs reviewed and are stable.   On the day of discharge, the patient was hemodynamically stable.  She will need to follow-up with her primary care provider and continue physical therapy at SNF.  Fall precautions.  Hospital Course:  Principal Problem:   COPD exacerbation ([place]) Active Problems:   Anemia   Acute renal failure superimposed on stage 3 chronic kidney disease (HCC)   Atypical chest pain- no history of CAD. Myoview low risk 2012   Cirrhosis of liver (HCC)   Hyperlipidemia   Chronic diastolic congestive heart failure (HCC)   Type II diabetes mellitus with renal manifestations (HCC)   Tobacco abuse   Generalized weakness   Chronic respiratory failure with hypoxia (HCC)   Malnutrition of moderate degree  Resolving right upper lobe community-acquired pneumonia, present admission Completed IV Rocephin and azithromycin x3 days Continue cefdinir x4 days Follow-up with your PCP O2 sat on 11/15/2018 was 99%  Resolving AKI on CKD 3 Baseline creatinine is 1.51 with GFR of 39 Presented with creatinine of 2.58 Creatinine today on 11/15/2018 is 1.88 from 2.20 on 11/14/2018  Follow-up with your primary care provider Avoid dehydration Stop po Lasix  Type 2 diabetes with hyperglycemia A1c on 11/13/2018 was 8.4 Stop glimepiride and Januvia due to low GFR Continue Lantus 10 unit in the morning Follow-up with your PCP  Chronic normocytic anemia Baseline hemoglobin appears to be 9 HemoGlobin stable at 7.9 No sign of overt bleeding Continue ferrous sulfate Follow-up with your PCP  Generalized weakness/physical debility/ambulatory dysfunction PT OT recommended SNF CSW consulted for placement Fall  precautions Continue physical therapy as tolerated    Code Status:Full Code.      Discharge Exam: BP 92/70 (BP Location: Left Arm)     Pulse 89    Temp 97.8 F (36.6 C) (Oral)    Resp 17    Ht 5' (1.524 m)    Wt 51.2 kg    SpO2 100%    BMI 22.03 kg/m   General: 73 y.o. year-old female WD WN NAD alert and interactive.  Cardiovascular: RRR no JVD or thyromegaly.  Respiratory: CTA no wheezes or rales. Good inspiratory efforts  Abdomen: Soft nontender nondistended with normal bowel sounds x4 quadrants.  Musculoskeletal: No lower extremity edema. 2/4 pulses in all 4 extremities.  Psychiatry: Mood is appropriate for condition and setting  Discharge Instructions You were cared for by a hospitalist during your hospital stay. If you have any questions about your discharge medications or the care you received while you were in the hospital after you are discharged, you can call the unit and asked to speak with the hospitalist on call if the hospitalist that took care of you is not available. Once you are discharged, your primary care physician will handle any further medical issues. Please note that NO REFILLS for any discharge medications will be authorized once you are discharged, as it is imperative that you return to your primary care physician (or establish a relationship with a primary care physician if you do not have one) for your aftercare needs so that they can reassess your need for medications and monitor your lab values.   Allergies as of 11/16/2018      Reactions   Compazine [prochlorperazine Edisylate] Other (See Comments)   Altered mental status   Penicillins Hives   Has patient had a PCN reaction causing immediate rash, facial/tongue/throat swelling, SOB or lightheadedness with hypotension: Yes Has patient had a PCN reaction causing severe rash involving mucus membranes or skin necrosis: No Has patient had a PCN reaction that required hospitalization: No Has patient had a PCN reaction occurring within the last 10 years: No If all of the above answers are "NO", then may proceed with Cephalosporin use.   Phenergan  [promethazine Hcl] Other (See Comments)   Altered mental status   Lipitor [atorvastatin] Swelling   Body swells, but doesn't affect breathing   Chocolate Diarrhea   Lactose Intolerance (gi) Diarrhea   Other Other (See Comments)   Nuts- Diverticulitis   Pork-derived Products Other (See Comments)   Causes GOUT      Medication List    STOP taking these medications   furosemide 40 MG tablet Commonly known as:  LASIX   glimepiride 4 MG tablet Commonly known as:  AMARYL   Januvia 100 MG tablet Generic drug:  sitaGLIPtin     TAKE these medications   acetaminophen 325 MG tablet Commonly known as:  TYLENOL Take 2 tablets (650 mg total) by mouth every 6 (six) hours as needed for mild pain, fever or headache (or Fever >/= 101).   albuterol 108 (90 Base) MCG/ACT inhaler Commonly known as:  PROVENTIL HFA;VENTOLIN HFA Inhale 2 puffs into the lungs every 6 (six) hours as needed. For shortness of breath   albuterol (2.5 MG/3ML) 0.083% nebulizer solution Commonly known as:  PROVENTIL Take 2.5 mg by nebulization every 6 (six) hours as needed for wheezing or shortness of breath.   allopurinol 100 MG tablet Commonly known as:  ZYLOPRIM Take 100 mg by mouth daily.   aspirin 81 MG EC  tablet Take 1 tablet (81 mg total) by mouth daily with breakfast.   azithromycin 250 MG tablet Commonly known as:  ZITHROMAX Please take 250 mg daily x4 days   cefdinir 300 MG capsule Commonly known as:  OMNICEF Take 1 capsule (300 mg total) by mouth daily for 4 days.   cilostazol 100 MG tablet Commonly known as:  PLETAL Take 50 mg by mouth 2 (two) times daily.   diclofenac sodium 1 % Gel Commonly known as:  VOLTAREN Apply 2 g topically 4 (four) times daily.   ergocalciferol 1.25 MG (50000 UT) capsule Commonly known as:  VITAMIN D2 Take 50,000 Units by mouth every Monday. On Monday   ferrous sulfate 325 (65 FE) MG tablet Take 325 mg by mouth daily with breakfast.   fluticasone 50 MCG/ACT  nasal spray Commonly known as:  FLONASE Place 2 sprays into the nose daily as needed for allergies.   folic acid 1 MG tablet Commonly known as:  FOLVITE Take 1 tablet (1 mg total) by mouth daily.   hydrocortisone 25 MG suppository Commonly known as:  ANUSOL-HC Place 1 suppository (25 mg total) rectally 2 (two) times daily as needed for hemorrhoids or anal itching.   Lantus SoloStar 100 UNIT/ML Solostar Pen Generic drug:  Insulin Glargine Inject 10 Units into the skin every morning. What changed:  when to take this   magnesium oxide 400 MG tablet Commonly known as:  MAG-OX Take 400 mg by mouth daily.   metoprolol tartrate 50 MG tablet Commonly known as:  LOPRESSOR Take 1 tablet (50 mg total) by mouth 2 (two) times daily. What changed:  how much to take   montelukast 10 MG tablet Commonly known as:  SINGULAIR Take 10 mg by mouth at bedtime.   nicotine 21 mg/24hr patch Commonly known as:  NICODERM CQ - dosed in mg/24 hours Place 1 patch (21 mg total) onto the skin daily.   omeprazole 40 MG capsule Commonly known as:  PRILOSEC Take 40 mg by mouth daily.   oxyCODONE 5 MG immediate release tablet Commonly known as:  Oxy IR/ROXICODONE Take 1-2 tablets (5-10 mg total) by mouth every 6 (six) hours as needed for moderate pain or severe pain.   OXYGEN Inhale 2 L into the lungs at bedtime.   simvastatin 10 MG tablet Commonly known as:  ZOCOR Take 10 mg by mouth daily.   thiamine 100 MG tablet Take 1 tablet (100 mg total) by mouth daily.   triamcinolone ointment 0.1 % Commonly known as:  KENALOG Apply 1 application topically at bedtime.      Allergies  Allergen Reactions   Compazine [Prochlorperazine Edisylate] Other (See Comments)    Altered mental status   Penicillins Hives    Has patient had a PCN reaction causing immediate rash, facial/tongue/throat swelling, SOB or lightheadedness with hypotension: Yes Has patient had a PCN reaction causing severe rash  involving mucus membranes or skin necrosis: No Has patient had a PCN reaction that required hospitalization: No Has patient had a PCN reaction occurring within the last 10 years: No If all of the above answers are "NO", then may proceed with Cephalosporin use.    Phenergan [Promethazine Hcl] Other (See Comments)    Altered mental status   Lipitor [Atorvastatin] Swelling    Body swells, but doesn't affect breathing   Chocolate Diarrhea   Lactose Intolerance (Gi) Diarrhea   Other Other (See Comments)    Nuts- Diverticulitis   Pork-Derived Products Other (See Comments)  Causes GOUT   Follow-up Information    Nolene Ebbs, MD. Call in 1 day(s).   Specialty:  Internal Medicine Why:  Please call for a post hospital follow-up appointment. Contact information: [address] [place] [address] [phone]            The results of significant diagnostics from this hospitalization (including imaging, microbiology, ancillary and laboratory) are listed below for reference.    Significant Diagnostic Studies: Dg Chest 2 View  Result Date: 11/12/2018 CLINICAL DATA:  Weakness. EXAM: CHEST - 2 VIEW COMPARISON:  08/28/2018 FINDINGS: Lungs are hyperexpanded. Interstitial markings are diffusely coarsened with chronic features. Hazy opacity noted in the right apex. No pleural effusion or evidence of pulmonary edema. The cardiopericardial silhouette is within normal limits for size. Bones are diffusely demineralized. Telemetry leads overlie the chest. Multiple thoracic compression fractures evident. IMPRESSION: 1. Hyperexpansion with underlying chronic interstitial changes. 2. Hazy opacity in the right lung apex, indeterminate but pneumonia not excluded. Follow-up recommended to ensure resolution. Electronically Signed   By: Misty Stanley M.D.   On: 11/12/2018 17:14   Ct Head Wo Contrast  Result Date: 11/12/2018 CLINICAL DATA:  Altered mental status EXAM: CT HEAD WITHOUT CONTRAST  TECHNIQUE: Contiguous axial images were obtained from the base of the skull through the vertex without intravenous contrast. Sagittal and coronal MPR images reconstructed from axial data set. COMPARISON:  08/28/2018 FINDINGS: Brain: Normal ventricular morphology. No midline shift or mass effect. Mild small vessel chronic ischemic changes of deep cerebral white matter. No intracranial hemorrhage, mass lesion or evidence acute infarction. No extra-axial fluid collections. Vascular: Atherosclerotic calcification of internal carotid and vertebral arteries at skull base Skull: Demineralized but intact Sinuses/Orbits: Deformity and chronic opacification of RIGHT maxillary sinus likely sequela of remote trauma. Remaining paranasal sinuses and mastoid air cells clear. Other: N/A IMPRESSION: Atrophy with mild small vessel chronic ischemic changes of deep cerebral white matter. No acute intracranial abnormalities. Electronically Signed   By: Lavonia Dana M.D.   On: 11/12/2018 18:24    Microbiology: Recent Results (from the past 240 hour(s))  Culture, blood (routine x 2) Call MD if unable to obtain prior to antibiotics being given     Status: None (Preliminary result)   Collection Time: 11/12/18 11:42 PM  Result Value Ref Range Status   Specimen Description BLOOD LEFT ARM  Final   Special Requests   Final    BOTTLES DRAWN AEROBIC AND ANAEROBIC Blood Culture adequate volume   Culture   Final    NO GROWTH 2 DAYS Performed at [facility], [address] [place], [place] [address]    Report Status PENDING  Incomplete  Culture, blood (Routine X 2) w Reflex to ID Panel     Status: None (Preliminary result)   Collection Time: 11/13/18  5:51 AM  Result Value Ref Range Status   Specimen Description BLOOD LEFT HAND  Final   Special Requests   Final    BOTTLES DRAWN AEROBIC AND ANAEROBIC Blood Culture adequate volume   Culture   Final    NO GROWTH 2 DAYS Performed at [facility], [place] [address] [place], [place] [address]    Report Status PENDING  Incomplete     Labs: Basic Metabolic Panel: Recent Labs  Lab 11/12/18 1719 11/13/18 1209 11/14/18 0227 11/15/18 0304  NA 134* 139 136  --   K 4.5 4.3 4.3  --   CL 106 112* 112*  --   CO2  22 21* 19*  --   GLUCOSE 293* 120* 146*  --   BUN 50* 45* 42*  --   CREATININE 2.58* 2.14* 2.20* 1.88*  CALCIUM 8.4* 8.1* 7.8*  --    Liver Function Tests: Recent Labs  Lab 11/12/18 1719  AST 26  ALT 11  ALKPHOS 281*  BILITOT 0.2*  PROT 5.8*  ALBUMIN 2.1*   No results for input(s): LIPASE, AMYLASE in the last 168 hours. Recent Labs  Lab 11/12/18 2339  AMMONIA 33   CBC: Recent Labs  Lab 11/12/18 1719 11/14/18 0227 11/14/18 1123 11/15/18 0304  WBC 14.2* 13.7*  --   --   HGB 8.3* 6.8* 7.5* 7.9*  HCT 28.1* 23.1* 24.8* 26.8*  MCV 85.4 84.9  --   --   PLT 418* 357  --   --    Cardiac Enzymes: Recent Labs  Lab 11/12/18 2339 11/13/18 0358 11/13/18 0919  TROPONINI <0.03 <0.03 <0.03   BNP: BNP (last 3 results) Recent Labs    11/12/18 2339  BNP 192.9*    ProBNP (last 3 results) No results for input(s): PROBNP in the last 8760 hours.  CBG: Recent Labs  Lab 11/15/18 1222 11/15/18 1250 11/15/18 1717 11/15/18 2216 11/16/18 0737  GLUCAP 42* 121* 95 112* 107*       Signed:  Kayleen Memos, MD [facility] 11/16/2018, 10:40 AM

## 2018-11-17 LAB — GLUCOSE, CAPILLARY
GLUCOSE-CAPILLARY: 94 mg/dL (ref 70–99)
Glucose-Capillary: 69 mg/dL — ABNORMAL LOW (ref 70–99)
Glucose-Capillary: 195 mg/dL — ABNORMAL HIGH (ref 70–99)
Glucose-Capillary: 273 mg/dL — ABNORMAL HIGH (ref 70–99)
Glucose-Capillary: 131 mg/dL — ABNORMAL HIGH (ref 70–99)

## 2018-11-17 MED ORDER — INSULIN GLARGINE 100 UNIT/ML ~~LOC~~ SOLN
4.0000 [IU] | Freq: Every day | SUBCUTANEOUS | Status: DC
  Administered 2018-11-17: 4 [IU] via SUBCUTANEOUS
  Filled 2018-11-17: qty 0.04

## 2018-11-17 MED ORDER — INSULIN GLARGINE 100 UNIT/ML ~~LOC~~ SOLN: 7.0000 [IU] | Freq: Every day | SUBCUTANEOUS | 0 refills | Status: DC

## 2018-11-17 MED ORDER — INSULIN ASPART 100 UNIT/ML ~~LOC~~ SOLN
3.0000 [IU] | Freq: Three times a day (TID) | SUBCUTANEOUS | Status: DC
  Administered 2018-11-17: 3 [IU] via SUBCUTANEOUS

## 2018-11-17 MED ORDER — LACTATED RINGERS IV SOLN
INTRAVENOUS | Status: DC
  Administered 2018-11-17: 18:00:00 via INTRAVENOUS

## 2018-11-17 NOTE — Progress Notes (Signed)
CSW reached out to admissions director for [facility], we are still awaiting insurance authorization.   CSW will continue to follow.   Domenic Schwab, MSW, [place]

## 2018-11-17 NOTE — Discharge Summary (Signed)
Discharge Summary  [name] VEH:[id number] DOB: December 21, 1945  PCP: Nolene Ebbs, MD  Admit date: 11/12/2018 Discharge date: 11/17/2018  Time spent: 35 minutes  DC delayed due to pending insurance authorization.  Recommendations for Outpatient Follow-up:  1. Follow-up with your primary care provider 2. Take your medications as prescribed 3. Continue physical therapy at SNF 4. Fall precautions  Discharge Diagnoses:  Active Hospital Problems   Diagnosis Date Noted   COPD exacerbation ([place]) 01/28/2011   Malnutrition of moderate degree 11/15/2018   Type II diabetes mellitus with renal manifestations (HCC) 11/12/2018   Tobacco abuse 11/12/2018   Generalized weakness 11/12/2018   Chronic respiratory failure with hypoxia (HCC) 11/12/2018   Chronic diastolic congestive heart failure ([place])    Hyperlipidemia 02/10/2015   Cirrhosis of liver ([place]) 11/17/2012   Atypical chest pain- no history of CAD. Myoview low risk 2012 11/15/2012   Acute renal failure superimposed on stage 3 chronic kidney disease ([place]) 06/16/2012   Anemia 01/28/2011    Resolved Hospital Problems  No resolved problems to display.    Discharge Condition: Stable  Diet recommendation: Resume previous diet  Vitals:   11/16/18 2336 11/17/18 0812  BP: 129/67 (!) 149/94  Pulse: 78 86  Resp: 20 18  Temp: 98.2 F (36.8 C) 98.4 F (36.9 C)  SpO2: 100% 100%    History of present illness:   Regina Toomey Youngis an 73 y.o.femalewith medical history significant ofhypertension, hyperlipidemia, diabetes mellitus, COPD on 2 L nasal cannula oxygen at home, CKD stage III, GI bleeding, DVT not on anticoagulants, alcoholic cirrhosis, alcohol abuse in remission,dCHF, diverticular GI bleeding, tobacco abuse, anemia, pork allergy, who presents with generalized weakness, cough, chest pain.  Admitted for community-acquired pneumonia.  Hospital course complicated by AKI on CKD 3 which improved with IV  hydration.  11/17/18: Patient seen and examined at bedside.  No acute events overnight.  No new complaints.  She wants to go to SNF.  Vital signs reviewed and are stable.   On the day of discharge, the patient was hemodynamically stable.  She will need to follow-up with her primary care provider and continue physical therapy at SNF.  Fall precautions.  Hospital Course:  Principal Problem:   COPD exacerbation ([place]) Active Problems:   Anemia   Acute renal failure superimposed on stage 3 chronic kidney disease (HCC)   Atypical chest pain- no history of CAD. Myoview low risk 2012   Cirrhosis of liver (HCC)   Hyperlipidemia   Chronic diastolic congestive heart failure (HCC)   Type II diabetes mellitus with renal manifestations (HCC)   Tobacco abuse   Generalized weakness   Chronic respiratory failure with hypoxia (HCC)   Malnutrition of moderate degree  Resolving right upper lobe community-acquired pneumonia, present on admission Completed IV Rocephin and azithromycin x3 days Continue cefdinir x4 days Follow-up with your PCP O2 sat on 11/15/2018 was 99%  Resolving AKI on CKD 3 Baseline creatinine is 1.51 with GFR of 39 Presented with creatinine of 2.58 Creatinine today on 11/15/2018 is 1.88 from 2.20 on 11/14/2018  Follow-up with your primary care provider Avoid dehydration Stop po Lasix  Type 2 diabetes with hypoglycemia due to reduced oral intake A1c on 11/13/2018 was 8.4 Stop glimepiride and Januvia due to low GFR Decreased Lantus from 10 units daily to 7 units daily.  Avoid hypoglycemia Follow-up with your PCP  Chronic normocytic anemia Baseline hemoglobin appears to be 9 HemoGlobin stable at 7.9 No sign of overt bleeding Continue ferrous sulfate Follow-up  with your PCP  Generalized weakness/physical debility/ambulatory dysfunction PT OT recommended SNF CSW consulted for placement Fall precautions Continue physical therapy as tolerated    Code Status:Full  Code.      Discharge Exam: BP (!) 149/94 (BP Location: Left Arm)    Pulse 86    Temp 98.4 F (36.9 C) (Oral)    Resp 18    Ht 5' (1.524 m)    Wt 51.2 kg    SpO2 100%    BMI 22.03 kg/m   General: 73 y.o. year-old female well-developed well-nourished in no acute distress.  Alert and interactive.  Cardiovascular: Regular rate and rhythm with no rubs or gallops.  No JVD or thyromegaly.  Respiratory: Clear to auscultation with no wheezes or rales.  Good inspiratory effort.  Abdomen: Soft nontender nondistended with normal bowel sounds x4 quadrants.  Musculoskeletal: No lower extremity edema. 2/4 pulses in all 4 extremities.  Psychiatry: Mood is appropriate for condition and setting  Discharge Instructions You were cared for by a hospitalist during your hospital stay. If you have any questions about your discharge medications or the care you received while you were in the hospital after you are discharged, you can call the unit and asked to speak with the hospitalist on call if the hospitalist that took care of you is not available. Once you are discharged, your primary care physician will handle any further medical issues. Please note that NO REFILLS for any discharge medications will be authorized once you are discharged, as it is imperative that you return to your primary care physician (or establish a relationship with a primary care physician if you do not have one) for your aftercare needs so that they can reassess your need for medications and monitor your lab values.   Allergies as of 11/17/2018      Reactions   Compazine [prochlorperazine Edisylate] Other (See Comments)   Altered mental status   Penicillins Hives   Has patient had a PCN reaction causing immediate rash, facial/tongue/throat swelling, SOB or lightheadedness with hypotension: Yes Has patient had a PCN reaction causing severe rash involving mucus membranes or skin necrosis: No Has patient had a PCN reaction that  required hospitalization: No Has patient had a PCN reaction occurring within the last 10 years: No If all of the above answers are "NO", then may proceed with Cephalosporin use.   Phenergan [promethazine Hcl] Other (See Comments)   Altered mental status   Lipitor [atorvastatin] Swelling   Body swells, but doesn't affect breathing   Chocolate Diarrhea   Lactose Intolerance (gi) Diarrhea   Other Other (See Comments)   Nuts- Diverticulitis   Pork-derived Products Other (See Comments)   Causes GOUT      Medication List    STOP taking these medications   furosemide 40 MG tablet Commonly known as:  LASIX   glimepiride 4 MG tablet Commonly known as:  AMARYL   Januvia 100 MG tablet Generic drug:  sitaGLIPtin   Lantus SoloStar 100 UNIT/ML Solostar Pen Generic drug:  Insulin Glargine Replaced by:  insulin glargine 100 UNIT/ML injection     TAKE these medications   acetaminophen 325 MG tablet Commonly known as:  TYLENOL Take 2 tablets (650 mg total) by mouth every 6 (six) hours as needed for mild pain, fever or headache (or Fever >/= 101).   albuterol 108 (90 Base) MCG/ACT inhaler Commonly known as:  PROVENTIL HFA;VENTOLIN HFA Inhale 2 puffs into the lungs every 6 (six) hours as needed. For  shortness of breath   albuterol (2.5 MG/3ML) 0.083% nebulizer solution Commonly known as:  PROVENTIL Take 2.5 mg by nebulization every 6 (six) hours as needed for wheezing or shortness of breath.   allopurinol 100 MG tablet Commonly known as:  ZYLOPRIM Take 100 mg by mouth daily.   aspirin 81 MG EC tablet Take 1 tablet (81 mg total) by mouth daily with breakfast.   azithromycin 250 MG tablet Commonly known as:  ZITHROMAX Please take 250 mg daily x4 days   cefdinir 300 MG capsule Commonly known as:  OMNICEF Take 1 capsule (300 mg total) by mouth daily for 4 days.   cilostazol 100 MG tablet Commonly known as:  PLETAL Take 50 mg by mouth 2 (two) times daily.   diclofenac sodium 1  % Gel Commonly known as:  VOLTAREN Apply 2 g topically 4 (four) times daily.   ergocalciferol 1.25 MG (50000 UT) capsule Commonly known as:  VITAMIN D2 Take 50,000 Units by mouth every Monday. On Monday   ferrous sulfate 325 (65 FE) MG tablet Take 325 mg by mouth daily with breakfast.   fluticasone 50 MCG/ACT nasal spray Commonly known as:  FLONASE Place 2 sprays into the nose daily as needed for allergies.   folic acid 1 MG tablet Commonly known as:  FOLVITE Take 1 tablet (1 mg total) by mouth daily.   hydrocortisone 25 MG suppository Commonly known as:  ANUSOL-HC Place 1 suppository (25 mg total) rectally 2 (two) times daily as needed for hemorrhoids or anal itching.   insulin glargine 100 UNIT/ML injection Commonly known as:  LANTUS Inject 0.07 mLs (7 Units total) into the skin daily. Start taking on:  November 18, 2018 Replaces:  Lantus SoloStar 100 UNIT/ML Solostar Pen   magnesium oxide 400 MG tablet Commonly known as:  MAG-OX Take 400 mg by mouth daily.   metoprolol tartrate 50 MG tablet Commonly known as:  LOPRESSOR Take 1 tablet (50 mg total) by mouth 2 (two) times daily. What changed:  how much to take   montelukast 10 MG tablet Commonly known as:  SINGULAIR Take 10 mg by mouth at bedtime.   nicotine 21 mg/24hr patch Commonly known as:  NICODERM CQ - dosed in mg/24 hours Place 1 patch (21 mg total) onto the skin daily.   omeprazole 40 MG capsule Commonly known as:  PRILOSEC Take 40 mg by mouth daily.   oxyCODONE 5 MG immediate release tablet Commonly known as:  Oxy IR/ROXICODONE Take 1-2 tablets (5-10 mg total) by mouth every 6 (six) hours as needed for moderate pain or severe pain.   OXYGEN Inhale 2 L into the lungs at bedtime.   simvastatin 10 MG tablet Commonly known as:  ZOCOR Take 10 mg by mouth daily.   thiamine 100 MG tablet Take 1 tablet (100 mg total) by mouth daily.   triamcinolone ointment 0.1 % Commonly known as:  KENALOG Apply 1  application topically at bedtime.      Allergies  Allergen Reactions   Compazine [Prochlorperazine Edisylate] Other (See Comments)    Altered mental status   Penicillins Hives    Has patient had a PCN reaction causing immediate rash, facial/tongue/throat swelling, SOB or lightheadedness with hypotension: Yes Has patient had a PCN reaction causing severe rash involving mucus membranes or skin necrosis: No Has patient had a PCN reaction that required hospitalization: No Has patient had a PCN reaction occurring within the last 10 years: No If all of the above answers are "NO",  then may proceed with Cephalosporin use.    Phenergan [Promethazine Hcl] Other (See Comments)    Altered mental status   Lipitor [Atorvastatin] Swelling    Body swells, but doesn't affect breathing   Chocolate Diarrhea   Lactose Intolerance (Gi) Diarrhea   Other Other (See Comments)    Nuts- Diverticulitis   Pork-Derived Products Other (See Comments)    Causes GOUT   Follow-up Information    Nolene Ebbs, MD. Call in 1 day(s).   Specialty:  Internal Medicine Why:  Please call for a post hospital follow-up appointment. Contact information: [place] [address] [phone]            The results of significant diagnostics from this hospitalization (including imaging, microbiology, ancillary and laboratory) are listed below for reference.    Significant Diagnostic Studies: Dg Chest 2 View  Result Date: 11/12/2018 CLINICAL DATA:  Weakness. EXAM: CHEST - 2 VIEW COMPARISON:  08/28/2018 FINDINGS: Lungs are hyperexpanded. Interstitial markings are diffusely coarsened with chronic features. Hazy opacity noted in the right apex. No pleural effusion or evidence of pulmonary edema. The cardiopericardial silhouette is within normal limits for size. Bones are diffusely demineralized. Telemetry leads overlie the chest. Multiple thoracic compression fractures evident. IMPRESSION: 1.  Hyperexpansion with underlying chronic interstitial changes. 2. Hazy opacity in the right lung apex, indeterminate but pneumonia not excluded. Follow-up recommended to ensure resolution. Electronically Signed   By: Misty Stanley M.D.   On: 11/12/2018 17:14   Ct Head Wo Contrast  Result Date: 11/12/2018 CLINICAL DATA:  Altered mental status EXAM: CT HEAD WITHOUT CONTRAST TECHNIQUE: Contiguous axial images were obtained from the base of the skull through the vertex without intravenous contrast. Sagittal and coronal MPR images reconstructed from axial data set. COMPARISON:  08/28/2018 FINDINGS: Brain: Normal ventricular morphology. No midline shift or mass effect. Mild small vessel chronic ischemic changes of deep cerebral white matter. No intracranial hemorrhage, mass lesion or evidence acute infarction. No extra-axial fluid collections. Vascular: Atherosclerotic calcification of internal carotid and vertebral arteries at skull base Skull: Demineralized but intact Sinuses/Orbits: Deformity and chronic opacification of RIGHT maxillary sinus likely sequela of remote trauma. Remaining paranasal sinuses and mastoid air cells clear. Other: N/A IMPRESSION: Atrophy with mild small vessel chronic ischemic changes of deep cerebral white matter. No acute intracranial abnormalities. Electronically Signed   By: Lavonia Dana M.D.   On: 11/12/2018 18:24    Microbiology: Recent Results (from the past 240 hour(s))  Culture, blood (routine x 2) Call MD if unable to obtain prior to antibiotics being given     Status: None (Preliminary result)   Collection Time: 11/12/18 11:42 PM  Result Value Ref Range Status   Specimen Description BLOOD LEFT ARM  Final   Special Requests   Final    BOTTLES DRAWN AEROBIC AND ANAEROBIC Blood Culture adequate volume   Culture   Final    NO GROWTH 4 DAYS Performed at [facility], [address] [place],  [address]    Report Status PENDING  Incomplete  Culture, blood  (Routine X 2) w Reflex to ID Panel     Status: None (Preliminary result)   Collection Time: 11/13/18  5:51 AM  Result Value Ref Range Status   Specimen Description BLOOD LEFT HAND  Final   Special Requests   Final    BOTTLES DRAWN AEROBIC AND ANAEROBIC Blood Culture adequate volume   Culture   Final    NO GROWTH 4 DAYS Performed  at [facility], [place] [address] [place], [place] [address]    Report Status PENDING  Incomplete     Labs: Basic Metabolic Panel: Recent Labs  Lab 11/12/18 1719 11/13/18 1209 11/14/18 0227 11/15/18 0304  NA 134* 139 136  --   K 4.5 4.3 4.3  --   CL 106 112* 112*  --   CO2 22 21* 19*  --   GLUCOSE 293* 120* 146*  --   BUN 50* 45* 42*  --   CREATININE 2.58* 2.14* 2.20* 1.88*  CALCIUM 8.4* 8.1* 7.8*  --    Liver Function Tests: Recent Labs  Lab 11/12/18 1719  AST 26  ALT 11  ALKPHOS 281*  BILITOT 0.2*  PROT 5.8*  ALBUMIN 2.1*   No results for input(s): LIPASE, AMYLASE in the last 168 hours. Recent Labs  Lab 11/12/18 2339  AMMONIA 33   CBC: Recent Labs  Lab 11/12/18 1719 11/14/18 0227 11/14/18 1123 11/15/18 0304  WBC 14.2* 13.7*  --   --   HGB 8.3* 6.8* 7.5* 7.9*  HCT 28.1* 23.1* 24.8* 26.8*  MCV 85.4 84.9  --   --   PLT 418* 357  --   --    Cardiac Enzymes: Recent Labs  Lab 11/12/18 2339 11/13/18 0358 11/13/18 0919  TROPONINI <0.03 <0.03 <0.03   BNP: BNP (last 3 results) Recent Labs    11/12/18 2339  BNP 192.9*    ProBNP (last 3 results) No results for input(s): PROBNP in the last 8760 hours.  CBG: Recent Labs  Lab 11/16/18 1737 11/16/18 1757 11/16/18 2051 11/17/18 0815 11/17/18 0901  GLUCAP 60* 91 162* 69* 94       Signed:  Kayleen Memos, MD [facility] 11/17/2018, 10:26 AM

## 2018-11-18 LAB — BASIC METABOLIC PANEL
ANION GAP: 5 (ref 5–15)
BUN: 29 mg/dL — ABNORMAL HIGH (ref 8–23)
CALCIUM: 8 mg/dL — AB (ref 8.9–10.3)
CO2: 19 mmol/L — ABNORMAL LOW (ref 22–32)
Chloride: 111 mmol/L (ref 98–111)
Creatinine, Ser: 1.87 mg/dL — ABNORMAL HIGH (ref 0.44–1.00)
GFR calc Af Amer: 30 mL/min — ABNORMAL LOW (ref >60)
GFR calc non Af Amer: 26 mL/min — ABNORMAL LOW (ref >60)
Glucose, Bld: 40 mg/dL — CL (ref 70–99)
Potassium: 4.9 mmol/L (ref 3.5–5.1)
Sodium: 135 mmol/L (ref 135–145)

## 2018-11-18 LAB — BPAM RBC
Blood Product Expiration Date: 202004152359
Unit Type and Rh: 9500

## 2018-11-18 LAB — PHOSPHORUS: Phosphorus: 2.9 mg/dL (ref 2.5–4.6)

## 2018-11-18 LAB — TYPE AND SCREEN
ABO/RH(D): O POS
Antibody Screen: NEGATIVE
Donor AG Type: NEGATIVE
Unit division: 0

## 2018-11-18 LAB — GLUCOSE, CAPILLARY
GLUCOSE-CAPILLARY: 150 mg/dL — AB (ref 70–99)
Glucose-Capillary: 101 mg/dL — ABNORMAL HIGH (ref 70–99)
Glucose-Capillary: 65 mg/dL — ABNORMAL LOW (ref 70–99)
Glucose-Capillary: 170 mg/dL — ABNORMAL HIGH (ref 70–99)
Glucose-Capillary: 362 mg/dL — ABNORMAL HIGH (ref 70–99)
Glucose-Capillary: 155 mg/dL — ABNORMAL HIGH (ref 70–99)

## 2018-11-18 LAB — CULTURE, BLOOD (ROUTINE X 2)
Culture: NO GROWTH
Culture: NO GROWTH
Special Requests: ADEQUATE
Special Requests: ADEQUATE

## 2018-11-18 LAB — MAGNESIUM: Magnesium: 1.5 mg/dL — ABNORMAL LOW (ref 1.7–2.4)

## 2018-11-18 MED ORDER — MAGNESIUM SULFATE 4 GM/100ML IV SOLN
4.0000 g | Freq: Once | INTRAVENOUS | Status: AC
  Administered 2018-11-18: 4 g via INTRAVENOUS
  Filled 2018-11-18: qty 100

## 2018-11-18 MED ORDER — INSULIN ASPART 100 UNIT/ML ~~LOC~~ SOLN
0.0000 [IU] | Freq: Three times a day (TID) | SUBCUTANEOUS | Status: DC
  Administered 2018-11-18: 1 [IU] via SUBCUTANEOUS
  Administered 2018-11-18: 2 [IU] via SUBCUTANEOUS
  Administered 2018-11-18: 9 [IU] via SUBCUTANEOUS
  Administered 2018-11-19: 2 [IU] via SUBCUTANEOUS

## 2018-11-18 NOTE — Discharge Summary (Signed)
Discharge Summary  [name] CHE:[id number] DOB: [birth year]  PCP: Regina Ebbs, MD  Admit date: 11/12/2018 Discharge date: 11/18/2018  Time spent: 35 minutes  DC delayed due to pending insurance authorization.  Recommendations for Outpatient Follow-up:  1. Follow-up with your primary care provider 2. Take your medications as prescribed 3. Continue physical therapy at SNF 4. Fall precautions  Discharge Diagnoses:  Active Hospital Problems   Diagnosis Date Noted   COPD exacerbation ([place]) 01/28/2011   Malnutrition of moderate degree 11/15/2018   Type II diabetes mellitus with renal manifestations (HCC) 11/12/2018   Tobacco abuse 11/12/2018   Generalized weakness 11/12/2018   Chronic respiratory failure with hypoxia (HCC) 11/12/2018   Chronic diastolic congestive heart failure ([place])    Hyperlipidemia 02/10/2015   Cirrhosis of liver ([place]) 11/17/2012   Atypical chest pain- no history of CAD. Myoview low risk 2012 11/15/2012   Acute renal failure superimposed on stage 3 chronic kidney disease ([place]) 06/16/2012   Anemia 01/28/2011    Resolved Hospital Problems  No resolved problems to display.    Discharge Condition: Stable  Diet recommendation: Resume previous diet  Vitals:   11/17/18 2243 11/18/18 0733  BP: 130/77 (!) 160/102  Pulse: 86 82  Resp: 12 16  Temp: 97.7 F (36.5 C) 97.7 F (36.5 C)  SpO2: 99% 100%    History of present illness:   [name] Youngis an 73 y.o.femalewith medical history significant ofhypertension, hyperlipidemia, diabetes mellitus, COPD on 2 L nasal cannula oxygen at home, CKD stage III, GI bleeding, DVT not on anticoagulants, alcoholic cirrhosis, alcohol abuse in remission,dCHF, diverticular GI bleeding, tobacco abuse, anemia, pork allergy, who presents with generalized weakness, cough, chest pain.  Admitted for community-acquired pneumonia.  Hospital course complicated by AKI on CKD 3 which improved with IV  hydration.  11/18/18: Seen and examined at bedside.  She is alert and interactive.  Hypoglycemic overnight with blood sugar 40.  Insulin DC'd and treated for hypoglycemia.  We will continue to monitor closely off insulin. HemoGlobin A1c 8.4 on 11/13/2018.  Vital signs reviewed and are stable.  Awaiting insurance authorization to SNF.  Hospital Course:  Principal Problem:   COPD exacerbation ([place]) Active Problems:   Anemia   Acute renal failure superimposed on stage 3 chronic kidney disease (HCC)   Atypical chest pain- no history of CAD. Myoview low risk 2012   Cirrhosis of liver (HCC)   Hyperlipidemia   Chronic diastolic congestive heart failure (HCC)   Type II diabetes mellitus with renal manifestations (HCC)   Tobacco abuse   Generalized weakness   Chronic respiratory failure with hypoxia (HCC)   Malnutrition of moderate degree  Resolving right upper lobe community-acquired pneumonia, present on admission Completed IV Rocephin and azithromycin x3 days Completed 4 days of cefdinir Follow-up with your PCP O2 saturation 100% on room air  Resolving AKI on CKD 3 Baseline creatinine is 1.51 with GFR of 39 Presented with creatinine of 2.58 Creatinine today on 11/18/2018 is 1.87 from 1.88  Follow-up with your primary care provider Avoid dehydration Stopped Lasix  Type 2 diabetes with hypoglycemia due to reduced oral intake in the setting of insulin use and CKD 3 A1c on 11/13/2018 was 8.4 Stopped glimepiride and Januvia due to low GFR Stopped Lantus and NovoLog Continue close monitoring and avoid hypoglycemia Follow-up with your PCP  Chronic normocytic anemia Baseline hemoglobin appears to be 9 Last hemoGlobin stable at 7.9 No sign of overt bleeding Continue ferrous sulfate supplement Follow-up with your PCP  Generalized weakness/physical debility/ambulatory dysfunction PT OT recommended SNF CSW consulted for placement Fall precautions Continue physical therapy as  tolerated    Code Status:Full Code.      Discharge Exam: BP (!) 160/102 (BP Location: Left Arm)    Pulse 82    Temp 97.7 F (36.5 C) (Oral)    Resp 16    Ht 5' (1.524 m)    Wt 51.2 kg    SpO2 100%    BMI 22.03 kg/m   General: 73 y.o. year-old female well-developed well-nourished no acute distress.  Alert and interactive.  Cardiovascular: Regular rate and rhythm with no rubs or gallops.  No JVD or thyromegaly   Respiratory: Clear to Auscultation with No Wheezes or Rales.  Good Inspiratory Effort.  Abdomen: Soft nontender nondistended with normal bowel sounds x4 quadrants.  Musculoskeletal: No lower extremity edema. 2/4 pulses in all 4 extremities.  Psychiatry: Mood is appropriate for condition and setting  Discharge Instructions You were cared for by a hospitalist during your hospital stay. If you have any questions about your discharge medications or the care you received while you were in the hospital after you are discharged, you can call the unit and asked to speak with the hospitalist on call if the hospitalist that took care of you is not available. Once you are discharged, your primary care physician will handle any further medical issues. Please note that NO REFILLS for any discharge medications will be authorized once you are discharged, as it is imperative that you return to your primary care physician (or establish a relationship with a primary care physician if you do not have one) for your aftercare needs so that they can reassess your need for medications and monitor your lab values.   Allergies as of 11/18/2018      Reactions   Compazine [prochlorperazine Edisylate] Other (See Comments)   Altered mental status   Penicillins Hives   Has patient had a PCN reaction causing immediate rash, facial/tongue/throat swelling, SOB or lightheadedness with hypotension: Yes Has patient had a PCN reaction causing severe rash involving mucus membranes or skin necrosis: No Has  patient had a PCN reaction that required hospitalization: No Has patient had a PCN reaction occurring within the last 10 years: No If all of the above answers are "NO", then may proceed with Cephalosporin use.   Phenergan [promethazine Hcl] Other (See Comments)   Altered mental status   Lipitor [atorvastatin] Swelling   Body swells, but doesn't affect breathing   Chocolate Diarrhea   Lactose Intolerance (gi) Diarrhea   Other Other (See Comments)   Nuts- Diverticulitis   Pork-derived Products Other (See Comments)   Causes GOUT      Medication List    STOP taking these medications   furosemide 40 MG tablet Commonly known as:  LASIX   glimepiride 4 MG tablet Commonly known as:  AMARYL   Januvia 100 MG tablet Generic drug:  sitaGLIPtin   Lantus SoloStar 100 UNIT/ML Solostar Pen Generic drug:  Insulin Glargine     TAKE these medications   acetaminophen 325 MG tablet Commonly known as:  TYLENOL Take 2 tablets (650 mg total) by mouth every 6 (six) hours as needed for mild pain, fever or headache (or Fever >/= 101).   albuterol 108 (90 Base) MCG/ACT inhaler Commonly known as:  PROVENTIL HFA;VENTOLIN HFA Inhale 2 puffs into the lungs every 6 (six) hours as needed. For shortness of breath   albuterol (2.5 MG/3ML) 0.083% nebulizer solution Commonly  known as:  PROVENTIL Take 2.5 mg by nebulization every 6 (six) hours as needed for wheezing or shortness of breath.   allopurinol 100 MG tablet Commonly known as:  ZYLOPRIM Take 100 mg by mouth daily.   aspirin 81 MG EC tablet Take 1 tablet (81 mg total) by mouth daily with breakfast.   azithromycin 250 MG tablet Commonly known as:  ZITHROMAX Please take 250 mg daily x4 days   cefdinir 300 MG capsule Commonly known as:  OMNICEF Take 1 capsule (300 mg total) by mouth daily for 4 days.   cilostazol 100 MG tablet Commonly known as:  PLETAL Take 50 mg by mouth 2 (two) times daily.   diclofenac sodium 1 % Gel Commonly known  as:  VOLTAREN Apply 2 g topically 4 (four) times daily.   ergocalciferol 1.25 MG (50000 UT) capsule Commonly known as:  VITAMIN D2 Take 50,000 Units by mouth every Monday. On Monday   ferrous sulfate 325 (65 FE) MG tablet Take 325 mg by mouth daily with breakfast.   fluticasone 50 MCG/ACT nasal spray Commonly known as:  FLONASE Place 2 sprays into the nose daily as needed for allergies.   folic acid 1 MG tablet Commonly known as:  FOLVITE Take 1 tablet (1 mg total) by mouth daily.   hydrocortisone 25 MG suppository Commonly known as:  ANUSOL-HC Place 1 suppository (25 mg total) rectally 2 (two) times daily as needed for hemorrhoids or anal itching.   magnesium oxide 400 MG tablet Commonly known as:  MAG-OX Take 400 mg by mouth daily.   metoprolol tartrate 50 MG tablet Commonly known as:  LOPRESSOR Take 1 tablet (50 mg total) by mouth 2 (two) times daily. What changed:  how much to take   montelukast 10 MG tablet Commonly known as:  SINGULAIR Take 10 mg by mouth at bedtime.   nicotine 21 mg/24hr patch Commonly known as:  NICODERM CQ - dosed in mg/24 hours Place 1 patch (21 mg total) onto the skin daily.   omeprazole 40 MG capsule Commonly known as:  PRILOSEC Take 40 mg by mouth daily.   oxyCODONE 5 MG immediate release tablet Commonly known as:  Oxy IR/ROXICODONE Take 1-2 tablets (5-10 mg total) by mouth every 6 (six) hours as needed for moderate pain or severe pain.   OXYGEN Inhale 2 L into the lungs at bedtime.   simvastatin 10 MG tablet Commonly known as:  ZOCOR Take 10 mg by mouth daily.   thiamine 100 MG tablet Take 1 tablet (100 mg total) by mouth daily.   triamcinolone ointment 0.1 % Commonly known as:  KENALOG Apply 1 application topically at bedtime.      Allergies  Allergen Reactions   Compazine [Prochlorperazine Edisylate] Other (See Comments)    Altered mental status   Penicillins Hives    Has patient had a PCN reaction causing  immediate rash, facial/tongue/throat swelling, SOB or lightheadedness with hypotension: Yes Has patient had a PCN reaction causing severe rash involving mucus membranes or skin necrosis: No Has patient had a PCN reaction that required hospitalization: No Has patient had a PCN reaction occurring within the last 10 years: No If all of the above answers are "NO", then may proceed with Cephalosporin use.    Phenergan [Promethazine Hcl] Other (See Comments)    Altered mental status   Lipitor [Atorvastatin] Swelling    Body swells, but doesn't affect breathing   Chocolate Diarrhea   Lactose Intolerance (Gi) Diarrhea   Other Other (  See Comments)    Nuts- Diverticulitis   Pork-Derived Products Other (See Comments)    Causes GOUT   Follow-up Information    Regina Ebbs, MD. Call in 1 day(s).   Specialty:  Internal Medicine Why:  Please call for a post hospital follow-up appointment. Contact information: [address] [place] [address] [phone]            The results of significant diagnostics from this hospitalization (including imaging, microbiology, ancillary and laboratory) are listed below for reference.    Significant Diagnostic Studies: Dg Chest 2 View  Result Date: 11/12/2018 CLINICAL DATA:  Weakness. EXAM: CHEST - 2 VIEW COMPARISON:  08/28/2018 FINDINGS: Lungs are hyperexpanded. Interstitial markings are diffusely coarsened with chronic features. Hazy opacity noted in the right apex. No pleural effusion or evidence of pulmonary edema. The cardiopericardial silhouette is within normal limits for size. Bones are diffusely demineralized. Telemetry leads overlie the chest. Multiple thoracic compression fractures evident. IMPRESSION: 1. Hyperexpansion with underlying chronic interstitial changes. 2. Hazy opacity in the right lung apex, indeterminate but pneumonia not excluded. Follow-up recommended to ensure resolution. Electronically Signed   By: Misty Stanley  M.D.   On: 11/12/2018 17:14   Ct Head Wo Contrast  Result Date: 11/12/2018 CLINICAL DATA:  Altered mental status EXAM: CT HEAD WITHOUT CONTRAST TECHNIQUE: Contiguous axial images were obtained from the base of the skull through the vertex without intravenous contrast. Sagittal and coronal MPR images reconstructed from axial data set. COMPARISON:  08/28/2018 FINDINGS: Brain: Normal ventricular morphology. No midline shift or mass effect. Mild small vessel chronic ischemic changes of deep cerebral white matter. No intracranial hemorrhage, mass lesion or evidence acute infarction. No extra-axial fluid collections. Vascular: Atherosclerotic calcification of internal carotid and vertebral arteries at skull base Skull: Demineralized but intact Sinuses/Orbits: Deformity and chronic opacification of RIGHT maxillary sinus likely sequela of remote trauma. Remaining paranasal sinuses and mastoid air cells clear. Other: N/A IMPRESSION: Atrophy with mild small vessel chronic ischemic changes of deep cerebral white matter. No acute intracranial abnormalities. Electronically Signed   By: Lavonia Dana M.D.   On: 11/12/2018 18:24    Microbiology: Recent Results (from the past 240 hour(s))  Culture, blood (routine x 2) Call MD if unable to obtain prior to antibiotics being given     Status: None   Collection Time: 11/12/18 11:42 PM  Result Value Ref Range Status   Specimen Description BLOOD LEFT ARM  Final   Special Requests   Final    BOTTLES DRAWN AEROBIC AND ANAEROBIC Blood Culture adequate volume   Culture   Final    NO GROWTH 5 DAYS Performed at [facility], [address] [place], [place] [address]    Report Status 11/18/2018 FINAL  Final  Culture, blood (Routine X 2) w Reflex to ID Panel     Status: None   Collection Time: 11/13/18  5:51 AM  Result Value Ref Range Status   Specimen Description BLOOD LEFT HAND  Final   Special Requests   Final    BOTTLES DRAWN AEROBIC AND ANAEROBIC Blood Culture  adequate volume   Culture   Final    NO GROWTH 5 DAYS Performed at [facility], [place] [address] [place], [place] [address]    Report Status 11/18/2018 FINAL  Final     Labs: Basic Metabolic Panel: Recent Labs  Lab 11/12/18 1719 11/13/18 1209 11/14/18 0227 11/15/18 0304 11/18/18 0337  NA 134* 139 136  --  135  K 4.5 4.3 4.3  --  4.9  CL 106 112* 112*  --  111  CO2 22 21* 19*  --  19*  GLUCOSE 293* 120* 146*  --  40*  BUN 50* 45* 42*  --  29*  CREATININE 2.58* 2.14* 2.20* 1.88* 1.87*  CALCIUM 8.4* 8.1* 7.8*  --  8.0*  MG  --   --   --   --  1.5*  PHOS  --   --   --   --  2.9   Liver Function Tests: Recent Labs  Lab 11/12/18 1719  AST 26  ALT 11  ALKPHOS 281*  BILITOT 0.2*  PROT 5.8*  ALBUMIN 2.1*   No results for input(s): LIPASE, AMYLASE in the last 168 hours. Recent Labs  Lab 11/12/18 2339  AMMONIA 33   CBC: Recent Labs  Lab 11/12/18 1719 11/14/18 0227 11/14/18 1123 11/15/18 0304  WBC 14.2* 13.7*  --   --   HGB 8.3* 6.8* 7.5* 7.9*  HCT 28.1* 23.1* 24.8* 26.8*  MCV 85.4 84.9  --   --   PLT 418* 357  --   --    Cardiac Enzymes: Recent Labs  Lab 11/12/18 2339 11/13/18 0358 11/13/18 0919  TROPONINI <0.03 <0.03 <0.03   BNP: BNP (last 3 results) Recent Labs    11/12/18 2339  BNP 192.9*    ProBNP (last 3 results) No results for input(s): PROBNP in the last 8760 hours.  CBG: Recent Labs  Lab 11/17/18 1642 11/17/18 2101 11/18/18 0551 11/18/18 0615 11/18/18 0730  GLUCAP 273* 131* 65* 101* 170*       Signed:  Kayleen Memos, MD [facility] 11/18/2018, 8:32 AM

## 2018-11-18 NOTE — Progress Notes (Signed)
Lab notified me that patient had a Blood sugar of 40. I went in to check on pt and she was arousable and alert and oriented. Gave her two cups of OJ with sugar.  Follow up 20 minutes later and patients' bs is 101. W&NU2

## 2018-11-19 LAB — GLUCOSE, CAPILLARY: Glucose-Capillary: 160 mg/dL — ABNORMAL HIGH (ref 70–99)

## 2018-11-19 MED ORDER — INSULIN ASPART 100 UNIT/ML ~~LOC~~ SOLN: 0.0000 [IU] | Freq: Three times a day (TID) | SUBCUTANEOUS | 0 refills | Status: AC

## 2018-11-19 MED ORDER — INSULIN GLARGINE 100 UNIT/ML ~~LOC~~ SOLN
3.0000 [IU] | Freq: Every day | SUBCUTANEOUS | Status: DC
  Administered 2018-11-19: 3 [IU] via SUBCUTANEOUS
  Filled 2018-11-19: qty 0.03

## 2018-11-19 MED ORDER — INSULIN GLARGINE 100 UNIT/ML ~~LOC~~ SOLN: 3.0000 [IU] | Freq: Every day | SUBCUTANEOUS | 0 refills | Status: AC

## 2018-11-19 NOTE — TOC Transition Note (Signed)
Transition of Care [facility]) - CM/SW Discharge Note   Patient Details  Name: [name] MRN: [id number] Date of Birth: [birth year]  Transition of Care [facility]) CM/SW Contact:  Eileen Stanford, LCSW Phone Number: 11/19/2018, 10:57 AM   Clinical Narrative:    Clinical Social Worker facilitated patient discharge including contacting patient family and facility to confirm patient discharge plans.  Clinical information faxed to facility and family agreeable with plan.  CSW arranged ambulance transport via [place] to [facility] ([id number]).  RN to call [phone] for report prior to discharge.  Clinical Social Worker will sign off for now as social work intervention is no longer needed. Please consult [place] again if new need arises.     Final next level of care: Skilled Nursing Facility Barriers to Discharge: Ship broker, Continued Medical Work up   Patient Goals and CMS Choice Patient states their goals for this hospitalization and ongoing recovery are:: To return home after SNF CMS Medicare.gov Compare Post Acute Care list provided to:: Patient Choice offered to / list presented to : Patient  Discharge Placement   Existing PASRR number confirmed : 11/14/18          Patient chooses bed at: [facility] [place] Patient to be transferred to facility by: [facility] Name of family member notified: Simona Huh Patient and family notified of of transfer: 11/19/18  Discharge Plan and Services     Post Acute Care Choice: [place]                    Social Determinants of Health (SDOH) Interventions     Readmission Risk Interventions Readmission Risk Prevention Plan 11/15/2018  Transportation Screening Complete  Medication Review [employer]) Complete  PCP or Specialist appointment within 3-5 days of discharge Complete  HRI or [place] Not Complete  HRI or Home Care Consult Pt Refusal Comments N/A  SW Recovery Care/Counseling Consult  Complete  Palliative Care Screening Not Complete  Comments N/A  Skilled Nursing Facility Complete  Some recent data might be hidden

## 2018-11-19 NOTE — Discharge Summary (Signed)
Discharge Summary  [name] ZOX:[id number] DOB: 1946/03/23  PCP: Nolene Ebbs, MD  Admit date: 11/12/2018 Discharge date: 11/19/2018  Time spent: 35 minutes  DC delayed due to pending insurance authorization.  Recommendations for Outpatient Follow-up:  1. Follow-up with your primary care provider 2. Take your medications as prescribed 3. Continue physical therapy at SNF 4. Fall precautions  Discharge Diagnoses:  Active Hospital Problems   Diagnosis Date Noted  . COPD exacerbation ([place] [facility]) 01/28/2011  . Malnutrition of moderate degree 11/15/2018  . Type II diabetes mellitus with renal manifestations ([place]) 11/12/2018  . Tobacco abuse 11/12/2018  . Generalized weakness 11/12/2018  . Chronic respiratory failure with hypoxia ([place]) 11/12/2018  . Chronic diastolic congestive heart failure ([place])   . Hyperlipidemia 02/10/2015  . Cirrhosis of liver ([place]) 11/17/2012  . Atypical chest pain- no history of CAD. Myoview low risk 2012 11/15/2012  . Acute renal failure superimposed on stage 3 chronic kidney disease ([place]) 06/16/2012  . Anemia 01/28/2011    Resolved Hospital Problems  No resolved problems to display.    Discharge Condition: Stable  Diet recommendation: Resume previous diet  Vitals:   11/18/18 2213 11/19/18 0717  BP: (!) 144/72 (!) 126/59  Pulse: 90 92  Resp:  20  Temp: 98.3 F (36.8 C) 98.3 F (36.8 C)  SpO2: 90% 100%    History of present illness:   Regina Demary Youngis an 73 y.o.femalewith medical history significant ofhypertension, hyperlipidemia, diabetes mellitus, COPD on 2 L nasal cannula oxygen at home, CKD stage III, GI bleeding, DVT not on anticoagulants, alcoholic cirrhosis, alcohol abuse in remission,dCHF, diverticular GI bleeding, tobacco abuse, anemia, pork allergy, who presents with generalized weakness, cough, chest pain.  Admitted for community-acquired pneumonia.  Hospital course complicated by AKI on CKD 3 which improved with IV  hydration.  11/18/18: Seen and examined at bedside.  She is alert and interactive.  Hypoglycemic overnight with blood sugar 40.  Insulin DC'd and treated for hypoglycemia.  We will continue to monitor closely off insulin. HemoGlobin A1c 8.4 on 11/13/2018.  Vital signs reviewed and are stable.  11/19/18: Seen and examined at her bedside.  No acute events overnight.  Blood sugar improved.  Started back on Lantus 3 unit daily and insulin sliding scale.  Awaiting insurance authorization to SNF.  Hospital Course:  Principal Problem:   COPD exacerbation ([place]) Active Problems:   Anemia   Acute renal failure superimposed on stage 3 chronic kidney disease ([facility])   Atypical chest pain- no history of CAD. Myoview low risk 2012   Cirrhosis of liver ([facility])   Hyperlipidemia   Chronic diastolic congestive heart failure ([facility])   Type II diabetes mellitus with renal manifestations ([facility])   Tobacco abuse   Generalized weakness   Chronic respiratory failure with hypoxia ([facility])   Malnutrition of moderate degree  Resolved right upper lobe community-acquired pneumonia, present on admission Completed IV Rocephin and azithromycin x3 days Completed cefdinir 4 days Maintaining O2 saturation greater than 92% on room air Follow-up with your PCP  Resolving AKI on CKD 3 Baseline creatinine is 1.51 with GFR of 39 Presented with creatinine of 2.58 Creatinine on 11/18/2018 1.87 from 1.88  Follow-up with your primary care provider Avoid dehydration Stopped Lasix  Type 2 diabetes with hypoglycemia due to reduced oral intake in the setting of insulin use and CKD 3 A1c on 11/13/2018 was 8.4 Stopped glimepiride and Januvia due to low GFR Restarted Lantus 3 units daily and sensitive insulin sliding scale Avoid hypoglycemia  Follow-up with your primary care provider  Chronic normocytic anemia Baseline hemoglobin appears to be 9 Last hemoGlobin stable at 7.9 No sign of overt bleeding Continue ferrous sulfate  supplement Follow-up with your PCP  Generalized weakness/physical debility/ambulatory dysfunction PT OT recommended SNF CSW consulted for placement Fall precautions Continue physical therapy as tolerated    Code Status:Full Code.      Discharge Exam: BP (!) 126/59 (BP Location: Left Wrist)   Pulse 92   Temp 98.3 F (36.8 C) (Oral)   Resp 20   Ht 5' (1.524 m)   Wt 51.2 kg   SpO2 100%   BMI 22.03 kg/m  . General: 73 y.o. year-old female well-developed well-nourished in no acute distress.  Alert and interactive. . Cardiovascular: Regular rate and rhythm with no rubs or gallops.  No JVD or thyromegaly noted.   Marland Kitchen Respiratory: Clear to auscultation with no wheezes or rales.  Good inspiratory effort. . Abdomen: Soft nontender nondistended with normal bowel sounds x4 quadrants. . Musculoskeletal: No lower extremity edema. 2/4 pulses in all 4 extremities. Marland Kitchen Psychiatry: Mood is appropriate for condition and setting  Discharge Instructions You were cared for by a hospitalist during your hospital stay. If you have any questions about your discharge medications or the care you received while you were in the hospital after you are discharged, you can call the unit and asked to speak with the hospitalist on call if the hospitalist that took care of you is not available. Once you are discharged, your primary care physician will handle any further medical issues. Please note that NO REFILLS for any discharge medications will be authorized once you are discharged, as it is imperative that you return to your primary care physician (or establish a relationship with a primary care physician if you do not have one) for your aftercare needs so that they can reassess your need for medications and monitor your lab values.   Allergies as of 11/19/2018      Reactions   Compazine [prochlorperazine Edisylate] Other (See Comments)   Altered mental status   Penicillins Hives   Has patient had a PCN  reaction causing immediate rash, facial/tongue/throat swelling, SOB or lightheadedness with hypotension: Yes Has patient had a PCN reaction causing severe rash involving mucus membranes or skin necrosis: No Has patient had a PCN reaction that required hospitalization: No Has patient had a PCN reaction occurring within the last 10 years: No If all of the above answers are "NO", then may proceed with Cephalosporin use.   Phenergan [promethazine Hcl] Other (See Comments)   Altered mental status   Lipitor [atorvastatin] Swelling   Body swells, but doesn't affect breathing   Chocolate Diarrhea   Lactose Intolerance (gi) Diarrhea   Other Other (See Comments)   Nuts- Diverticulitis   Pork-derived Products Other (See Comments)   Causes GOUT      Medication List    STOP taking these medications   furosemide 40 MG tablet Commonly known as:  LASIX   glimepiride 4 MG tablet Commonly known as:  AMARYL   Januvia 100 MG tablet Generic drug:  sitaGLIPtin   Lantus SoloStar 100 UNIT/ML Solostar Pen Generic drug:  Insulin Glargine Replaced by:  insulin glargine 100 UNIT/ML injection     TAKE these medications   acetaminophen 325 MG tablet Commonly known as:  TYLENOL Take 2 tablets (650 mg total) by mouth every 6 (six) hours as needed for mild pain, fever or headache (or Fever >/= 101).  albuterol 108 (90 Base) MCG/ACT inhaler Commonly known as:  PROVENTIL HFA;VENTOLIN HFA Inhale 2 puffs into the lungs every 6 (six) hours as needed. For shortness of breath   albuterol (2.5 MG/3ML) 0.083% nebulizer solution Commonly known as:  PROVENTIL Take 2.5 mg by nebulization every 6 (six) hours as needed for wheezing or shortness of breath.   allopurinol 100 MG tablet Commonly known as:  ZYLOPRIM Take 100 mg by mouth daily.   aspirin 81 MG EC tablet Take 1 tablet (81 mg total) by mouth daily with breakfast.   cilostazol 100 MG tablet Commonly known as:  PLETAL Take 50 mg by mouth 2 (two)  times daily.   diclofenac sodium 1 % Gel Commonly known as:  VOLTAREN Apply 2 g topically 4 (four) times daily.   ergocalciferol 1.25 MG (50000 UT) capsule Commonly known as:  VITAMIN D2 Take 50,000 Units by mouth every Monday. On Monday   ferrous sulfate 325 (65 FE) MG tablet Take 325 mg by mouth daily with breakfast.   fluticasone 50 MCG/ACT nasal spray Commonly known as:  FLONASE Place 2 sprays into the nose daily as needed for allergies.   folic acid 1 MG tablet Commonly known as:  FOLVITE Take 1 tablet (1 mg total) by mouth daily.   hydrocortisone 25 MG suppository Commonly known as:  ANUSOL-HC Place 1 suppository (25 mg total) rectally 2 (two) times daily as needed for hemorrhoids or anal itching.   insulin aspart 100 UNIT/ML injection Commonly known as:  novoLOG Inject 0-9 Units into the skin 3 (three) times daily with meals. 0-9 Units, Subcutaneous, 3 times daily with meals, First dose (after last reorder) on Sun 11/18/18 at 1130 Correction coverage: Sensitive (thin, NPO, renal) CBG < 70: implement hypoglycemia protocol CBG 70 - 120: 0 units CBG 121 - 150: 1 unit CBG 151 - 200: 2 units CBG 201 - 250: 3 units CBG 251 - 300: 5 units CBG 301 - 350: 7 units CBG 351 - 400: 9 units CBG > 400: call MD and obtain STAT lab verification   insulin glargine 100 UNIT/ML injection Commonly known as:  LANTUS Inject 0.03 mLs (3 Units total) into the skin daily. Replaces:  Lantus SoloStar 100 UNIT/ML Solostar Pen   magnesium oxide 400 MG tablet Commonly known as:  MAG-OX Take 400 mg by mouth daily.   metoprolol tartrate 50 MG tablet Commonly known as:  LOPRESSOR Take 1 tablet (50 mg total) by mouth 2 (two) times daily. What changed:  how much to take   montelukast 10 MG tablet Commonly known as:  SINGULAIR Take 10 mg by mouth at bedtime.   nicotine 21 mg/24hr patch Commonly known as:  NICODERM CQ - dosed in mg/24 hours Place 1 patch (21 mg total) onto the skin daily.    omeprazole 40 MG capsule Commonly known as:  PRILOSEC Take 40 mg by mouth daily.   oxyCODONE 5 MG immediate release tablet Commonly known as:  Oxy IR/ROXICODONE Take 1-2 tablets (5-10 mg total) by mouth every 6 (six) hours as needed for moderate pain or severe pain.   OXYGEN Inhale 2 L into the lungs at bedtime.   simvastatin 10 MG tablet Commonly known as:  ZOCOR Take 10 mg by mouth daily.   thiamine 100 MG tablet Take 1 tablet (100 mg total) by mouth daily.   triamcinolone ointment 0.1 % Commonly known as:  KENALOG Apply 1 application topically at bedtime.      Allergies  Allergen Reactions  .  Compazine [Prochlorperazine Edisylate] Other (See Comments)    Altered mental status  . Penicillins Hives    Has patient had a PCN reaction causing immediate rash, facial/tongue/throat swelling, SOB or lightheadedness with hypotension: Yes Has patient had a PCN reaction causing severe rash involving mucus membranes or skin necrosis: No Has patient had a PCN reaction that required hospitalization: No Has patient had a PCN reaction occurring within the last 10 years: No If all of the above answers are "NO", then may proceed with Cephalosporin use.   Marland Kitchen Phenergan [Promethazine Hcl] Other (See Comments)    Altered mental status  . Lipitor [Atorvastatin] Swelling    Body swells, but doesn't affect breathing  . Chocolate Diarrhea  . Lactose Intolerance (Gi) Diarrhea  . Other Other (See Comments)    Nuts- Diverticulitis  . Pork-Derived Products Other (See Comments)    Causes GOUT   Follow-up Information    Nolene Ebbs, MD. Call in 1 day(s).   Specialty:  Internal Medicine Why:  Please call for a post hospital follow-up appointment. Contact information: [address] [place] [address] [phone]            The results of significant diagnostics from this hospitalization (including imaging, microbiology, ancillary and laboratory) are listed below for  reference.    Significant Diagnostic Studies: Dg Chest 2 View  Result Date: 11/12/2018 CLINICAL DATA:  Weakness. EXAM: CHEST - 2 VIEW COMPARISON:  08/28/2018 FINDINGS: Lungs are hyperexpanded. Interstitial markings are diffusely coarsened with chronic features. Hazy opacity noted in the right apex. No pleural effusion or evidence of pulmonary edema. The cardiopericardial silhouette is within normal limits for size. Bones are diffusely demineralized. Telemetry leads overlie the chest. Multiple thoracic compression fractures evident. IMPRESSION: 1. Hyperexpansion with underlying chronic interstitial changes. 2. Hazy opacity in the right lung apex, indeterminate but pneumonia not excluded. Follow-up recommended to ensure resolution. Electronically Signed   By: Misty Stanley M.D.   On: 11/12/2018 17:14   Ct Head Wo Contrast  Result Date: 11/12/2018 CLINICAL DATA:  Altered mental status EXAM: CT HEAD WITHOUT CONTRAST TECHNIQUE: Contiguous axial images were obtained from the base of the skull through the vertex without intravenous contrast. Sagittal and coronal MPR images reconstructed from axial data set. COMPARISON:  08/28/2018 FINDINGS: Brain: Normal ventricular morphology. No midline shift or mass effect. Mild small vessel chronic ischemic changes of deep cerebral white matter. No intracranial hemorrhage, mass lesion or evidence acute infarction. No extra-axial fluid collections. Vascular: Atherosclerotic calcification of internal carotid and vertebral arteries at skull base Skull: Demineralized but intact Sinuses/Orbits: Deformity and chronic opacification of RIGHT maxillary sinus likely sequela of remote trauma. Remaining paranasal sinuses and mastoid air cells clear. Other: N/A IMPRESSION: Atrophy with mild small vessel chronic ischemic changes of deep cerebral white matter. No acute intracranial abnormalities. Electronically Signed   By: Lavonia Dana M.D.   On: 11/12/2018 18:24    Microbiology: Recent  Results (from the past 240 hour(s))  Culture, blood (routine x 2) Call MD if unable to obtain prior to antibiotics being given     Status: None   Collection Time: 11/12/18 11:42 PM  Result Value Ref Range Status   Specimen Description BLOOD LEFT ARM  Final   Special Requests   Final    BOTTLES DRAWN AEROBIC AND ANAEROBIC Blood Culture adequate volume   Culture   Final    NO GROWTH 5 DAYS Performed at [facility], [address], [place], [place] [address]  Report Status 11/18/2018 FINAL  Final  Culture, blood (Routine X 2) w Reflex to ID Panel     Status: None   Collection Time: 11/13/18  5:51 AM  Result Value Ref Range Status   Specimen Description BLOOD LEFT HAND  Final   Special Requests   Final    BOTTLES DRAWN AEROBIC AND ANAEROBIC Blood Culture adequate volume   Culture   Final    NO GROWTH 5 DAYS Performed at [facility], [address] [place], [place] [address]    Report Status 11/18/2018 FINAL  Final     Labs: Basic Metabolic Panel: Recent Labs  Lab 11/12/18 1719 11/13/18 1209 11/14/18 0227 11/15/18 0304 11/18/18 0337  NA 134* 139 136  --  135  K 4.5 4.3 4.3  --  4.9  CL 106 112* 112*  --  111  CO2 22 21* 19*  --  19*  GLUCOSE 293* 120* 146*  --  40*  BUN 50* 45* 42*  --  29*  CREATININE 2.58* 2.14* 2.20* 1.88* 1.87*  CALCIUM 8.4* 8.1* 7.8*  --  8.0*  MG  --   --   --   --  1.5*  PHOS  --   --   --   --  2.9   Liver Function Tests: Recent Labs  Lab 11/12/18 1719  AST 26  ALT 11  ALKPHOS 281*  BILITOT 0.2*  PROT 5.8*  ALBUMIN 2.1*   No results for input(s): LIPASE, AMYLASE in the last 168 hours. Recent Labs  Lab 11/12/18 2339  AMMONIA 33   CBC: Recent Labs  Lab 11/12/18 1719 11/14/18 0227 11/14/18 1123 11/15/18 0304  WBC 14.2* 13.7*  --   --   HGB 8.3* 6.8* 7.5* 7.9*  HCT 28.1* 23.1* 24.8* 26.8*  MCV 85.4 84.9  --   --   PLT 418* 357  --   --    Cardiac Enzymes: Recent Labs  Lab 11/12/18 2339 11/13/18 0358  11/13/18 0919  TROPONINI <0.03 <0.03 <0.03   BNP: BNP (last 3 results) Recent Labs    11/12/18 2339  BNP 192.9*    ProBNP (last 3 results) No results for input(s): PROBNP in the last 8760 hours.  CBG: Recent Labs  Lab 11/18/18 0730 11/18/18 1138 11/18/18 1640 11/18/18 2055 11/19/18 0716  GLUCAP 170* 150* 362* 155* 160*       Signed:  Kayleen Memos, MD [facility] 11/19/2018, 9:28 AM

## 2018-11-19 NOTE — Progress Notes (Signed)
C/o CP and offered a NTG.  Pt states that she does not have CP but her back is aching.  Back rub given

## 2018-11-20 ENCOUNTER — Telehealth: Payer: Self-pay

## 2018-11-20 NOTE — Telephone Encounter (Signed)
UNABLE TO LMVM

## 2018-11-21 NOTE — Telephone Encounter (Signed)
COVID-19 Pre-Screening Questions:  Provider: Purcell Nails   Needs f/u  6 MONTHS  UNABLE TO LM NO VM, PHONE JUST KEEPS RINGING

## 2018-11-22 NOTE — Telephone Encounter (Signed)
S/W [name] he states that he last spoke with pt on Monday when she was in the hosp he states that he will try to get in touch with pt also.

## 2018-11-22 NOTE — Telephone Encounter (Signed)
Encounter closed

## 2018-11-23 NOTE — Telephone Encounter (Signed)
Tried to call pt, unable to contact-phone just keeps ringing. I have made numerous attempts and contacted emergency contacts and they have not been able to contact pt either. Will cancel appt d/t not being able to contact/leave voicemail message. Appt cancelled

## 2018-11-23 NOTE — Telephone Encounter (Signed)
CALLED [name] HE STATES THAT HE WAS UNABLE TO CONTACT PT

## 2018-11-26 ENCOUNTER — Ambulatory Visit: Payer: Self-pay | Admitting: Adult Health

## 2018-11-28 ENCOUNTER — Ambulatory Visit: Payer: Medicare Other | Admitting: Podiatry

## 2018-12-17 ENCOUNTER — Ambulatory Visit: Payer: Self-pay

## 2019-01-06 ENCOUNTER — Emergency Department (HOSPITAL_COMMUNITY): Payer: Medicare Other

## 2019-01-06 ENCOUNTER — Other Ambulatory Visit: Payer: Self-pay | Admitting: Oncology

## 2019-01-06 ENCOUNTER — Observation Stay (HOSPITAL_COMMUNITY): Payer: Medicare Other

## 2019-01-06 ENCOUNTER — Inpatient Hospital Stay (HOSPITAL_COMMUNITY)
Admission: EM | Admit: 2019-01-06 | Discharge: 2019-01-10 | DRG: 435 | Disposition: A | Discharge status: Skilled Nursing Facility | Payer: Medicare Other | Attending: Student | Admitting: Student

## 2019-01-06 ENCOUNTER — Encounter (HOSPITAL_COMMUNITY): Payer: Self-pay

## 2019-01-06 DIAGNOSIS — E1122 Type 2 diabetes mellitus with diabetic chronic kidney disease: Secondary | ICD-10-CM | POA: Diagnosis present

## 2019-01-06 DIAGNOSIS — C787 Secondary malignant neoplasm of liver and intrahepatic bile duct: Secondary | ICD-10-CM | POA: Diagnosis not present

## 2019-01-06 DIAGNOSIS — Z90710 Acquired absence of both cervix and uterus: Secondary | ICD-10-CM

## 2019-01-06 DIAGNOSIS — C7800 Secondary malignant neoplasm of unspecified lung: Secondary | ICD-10-CM | POA: Diagnosis present

## 2019-01-06 DIAGNOSIS — I5032 Chronic diastolic (congestive) heart failure: Secondary | ICD-10-CM | POA: Diagnosis present

## 2019-01-06 DIAGNOSIS — E1151 Type 2 diabetes mellitus with diabetic peripheral angiopathy without gangrene: Secondary | ICD-10-CM | POA: Diagnosis present

## 2019-01-06 DIAGNOSIS — C799 Secondary malignant neoplasm of unspecified site: Secondary | ICD-10-CM | POA: Diagnosis not present

## 2019-01-06 DIAGNOSIS — Z515 Encounter for palliative care: Secondary | ICD-10-CM

## 2019-01-06 DIAGNOSIS — I130 Hypertensive heart and chronic kidney disease with heart failure and stage 1 through stage 4 chronic kidney disease, or unspecified chronic kidney disease: Secondary | ICD-10-CM | POA: Diagnosis present

## 2019-01-06 DIAGNOSIS — Z7189 Other specified counseling: Secondary | ICD-10-CM

## 2019-01-06 DIAGNOSIS — C221 Intrahepatic bile duct carcinoma: Secondary | ICD-10-CM

## 2019-01-06 DIAGNOSIS — R072 Precordial pain: Secondary | ICD-10-CM

## 2019-01-06 DIAGNOSIS — Z1159 Encounter for screening for other viral diseases: Secondary | ICD-10-CM

## 2019-01-06 DIAGNOSIS — R079 Chest pain, unspecified: Secondary | ICD-10-CM | POA: Diagnosis present

## 2019-01-06 DIAGNOSIS — Z66 Do not resuscitate: Secondary | ICD-10-CM

## 2019-01-06 DIAGNOSIS — Z794 Long term (current) use of insulin: Secondary | ICD-10-CM

## 2019-01-06 DIAGNOSIS — E11649 Type 2 diabetes mellitus with hypoglycemia without coma: Secondary | ICD-10-CM | POA: Diagnosis present

## 2019-01-06 DIAGNOSIS — N189 Chronic kidney disease, unspecified: Secondary | ICD-10-CM

## 2019-01-06 DIAGNOSIS — R55 Syncope and collapse: Secondary | ICD-10-CM

## 2019-01-06 DIAGNOSIS — E875 Hyperkalemia: Secondary | ICD-10-CM

## 2019-01-06 DIAGNOSIS — F17210 Nicotine dependence, cigarettes, uncomplicated: Secondary | ICD-10-CM | POA: Diagnosis present

## 2019-01-06 DIAGNOSIS — R160 Hepatomegaly, not elsewhere classified: Secondary | ICD-10-CM

## 2019-01-06 DIAGNOSIS — K210 Gastro-esophageal reflux disease with esophagitis: Secondary | ICD-10-CM | POA: Diagnosis present

## 2019-01-06 DIAGNOSIS — Z880 Allergy status to penicillin: Secondary | ICD-10-CM

## 2019-01-06 DIAGNOSIS — K7030 Alcoholic cirrhosis of liver without ascites: Secondary | ICD-10-CM | POA: Diagnosis present

## 2019-01-06 DIAGNOSIS — Z888 Allergy status to other drugs, medicaments and biological substances status: Secondary | ICD-10-CM

## 2019-01-06 DIAGNOSIS — G92 Toxic encephalopathy: Secondary | ICD-10-CM | POA: Diagnosis present

## 2019-01-06 DIAGNOSIS — M109 Gout, unspecified: Secondary | ICD-10-CM | POA: Diagnosis present

## 2019-01-06 DIAGNOSIS — Z9981 Dependence on supplemental oxygen: Secondary | ICD-10-CM

## 2019-01-06 DIAGNOSIS — C7889 Secondary malignant neoplasm of other digestive organs: Secondary | ICD-10-CM | POA: Diagnosis present

## 2019-01-06 DIAGNOSIS — I2510 Atherosclerotic heart disease of native coronary artery without angina pectoris: Secondary | ICD-10-CM | POA: Diagnosis present

## 2019-01-06 DIAGNOSIS — N183 Chronic kidney disease, stage 3 (moderate): Secondary | ICD-10-CM | POA: Diagnosis present

## 2019-01-06 DIAGNOSIS — J449 Chronic obstructive pulmonary disease, unspecified: Secondary | ICD-10-CM | POA: Diagnosis present

## 2019-01-06 DIAGNOSIS — H409 Unspecified glaucoma: Secondary | ICD-10-CM | POA: Diagnosis present

## 2019-01-06 DIAGNOSIS — D649 Anemia, unspecified: Secondary | ICD-10-CM

## 2019-01-06 DIAGNOSIS — Z800 Family history of malignant neoplasm of digestive organs: Secondary | ICD-10-CM

## 2019-01-06 DIAGNOSIS — G8929 Other chronic pain: Secondary | ICD-10-CM | POA: Diagnosis present

## 2019-01-06 DIAGNOSIS — Z881 Allergy status to other antibiotic agents status: Secondary | ICD-10-CM

## 2019-01-06 HISTORY — DX: Intrahepatic bile duct carcinoma: C22.1

## 2019-01-06 LAB — GLUCOSE, CAPILLARY
Glucose-Capillary: 166 mg/dL — ABNORMAL HIGH (ref 70–99)
Glucose-Capillary: 22 mg/dL — CL (ref 70–99)
Glucose-Capillary: 12 mg/dL — CL (ref 70–99)
Glucose-Capillary: 22 mg/dL — CL (ref 70–99)
Glucose-Capillary: 27 mg/dL — CL (ref 70–99)
Glucose-Capillary: 34 mg/dL — CL (ref 70–99)
Glucose-Capillary: 113 mg/dL — ABNORMAL HIGH (ref 70–99)
Glucose-Capillary: 210 mg/dL — ABNORMAL HIGH (ref 70–99)
Glucose-Capillary: 125 mg/dL — ABNORMAL HIGH (ref 70–99)

## 2019-01-06 LAB — CBC
HCT: 28.1 % — ABNORMAL LOW (ref 36.0–46.0)
Hemoglobin: 8.3 g/dL — ABNORMAL LOW (ref 12.0–15.0)
MCH: 24.3 pg — ABNORMAL LOW (ref 26.0–34.0)
MCHC: 29.5 g/dL — ABNORMAL LOW (ref 30.0–36.0)
MCV: 82.2 fL (ref 80.0–100.0)
Platelets: 340 10*3/uL (ref 150–400)
RBC: 3.42 MIL/uL — ABNORMAL LOW (ref 3.87–5.11)
RDW: 17.2 % — ABNORMAL HIGH (ref 11.5–15.5)
WBC: 12.9 10*3/uL — ABNORMAL HIGH (ref 4.0–10.5)
nRBC: 0 % (ref 0.0–0.2)

## 2019-01-06 LAB — COMPREHENSIVE METABOLIC PANEL
ALT: 17 U/L (ref 0–44)
AST: 26 U/L (ref 15–41)
Albumin: 1.8 g/dL — ABNORMAL LOW (ref 3.5–5.0)
Alkaline Phosphatase: 262 U/L — ABNORMAL HIGH (ref 38–126)
Anion gap: 10 (ref 5–15)
BUN: 24 mg/dL — ABNORMAL HIGH (ref 8–23)
CO2: 15 mmol/L — ABNORMAL LOW (ref 22–32)
Calcium: 8.3 mg/dL — ABNORMAL LOW (ref 8.9–10.3)
Chloride: 110 mmol/L (ref 98–111)
Creatinine, Ser: 1.97 mg/dL — ABNORMAL HIGH (ref 0.44–1.00)
GFR calc Af Amer: 28 mL/min — ABNORMAL LOW (ref >60)
GFR calc non Af Amer: 24 mL/min — ABNORMAL LOW (ref >60)
Glucose, Bld: 315 mg/dL — ABNORMAL HIGH (ref 70–99)
Potassium: 5.8 mmol/L — ABNORMAL HIGH (ref 3.5–5.1)
Sodium: 135 mmol/L (ref 135–145)
Total Bilirubin: 0.4 mg/dL (ref 0.3–1.2)
Total Protein: 5.2 g/dL — ABNORMAL LOW (ref 6.5–8.1)

## 2019-01-06 LAB — BASIC METABOLIC PANEL
Anion gap: 7 (ref 5–15)
BUN: 22 mg/dL (ref 8–23)
CO2: 18 mmol/L — ABNORMAL LOW (ref 22–32)
Calcium: 7.8 mg/dL — ABNORMAL LOW (ref 8.9–10.3)
Chloride: 107 mmol/L (ref 98–111)
Creatinine, Ser: 1.71 mg/dL — ABNORMAL HIGH (ref 0.44–1.00)
GFR calc Af Amer: 34 mL/min — ABNORMAL LOW (ref >60)
GFR calc non Af Amer: 29 mL/min — ABNORMAL LOW (ref >60)
Glucose, Bld: 37 mg/dL — CL (ref 70–99)
Potassium: 4.4 mmol/L (ref 3.5–5.1)
Sodium: 132 mmol/L — ABNORMAL LOW (ref 135–145)

## 2019-01-06 LAB — DIFFERENTIAL
Abs Immature Granulocytes: 0.07 10*3/uL (ref 0.00–0.07)
Basophils Absolute: 0 10*3/uL (ref 0.0–0.1)
Basophils Relative: 0 %
Eosinophils Absolute: 0.1 10*3/uL (ref 0.0–0.5)
Eosinophils Relative: 0 %
Immature Granulocytes: 1 %
Lymphocytes Relative: 20 %
Lymphs Abs: 2.6 10*3/uL (ref 0.7–4.0)
Monocytes Absolute: 1.1 10*3/uL — ABNORMAL HIGH (ref 0.1–1.0)
Monocytes Relative: 9 %
Neutro Abs: 9 10*3/uL — ABNORMAL HIGH (ref 1.7–7.7)
Neutrophils Relative %: 70 %

## 2019-01-06 LAB — HEMOGLOBIN A1C
Hgb A1c MFr Bld: 8.4 % — ABNORMAL HIGH (ref 4.8–5.6)
Mean Plasma Glucose: 194.38 mg/dL

## 2019-01-06 LAB — AMMONIA: Ammonia: 38 umol/L — ABNORMAL HIGH (ref 9–35)

## 2019-01-06 LAB — TROPONIN I
Troponin I: <0.03 ng/mL (ref <0.03)
Troponin I: <0.03 ng/mL (ref <0.03)

## 2019-01-06 LAB — IRON AND TIBC
Iron: 16 ug/dL — ABNORMAL LOW (ref 28–170)
Saturation Ratios: 14 % (ref 10.4–31.8)
TIBC: 113 ug/dL — ABNORMAL LOW (ref 250–450)
UIBC: 97 ug/dL

## 2019-01-06 LAB — LACTATE DEHYDROGENASE: LDH: 246 U/L — ABNORMAL HIGH (ref 98–192)

## 2019-01-06 LAB — APTT: aPTT: 33 seconds (ref 24–36)

## 2019-01-06 LAB — PROTIME-INR
INR: 1.3 — ABNORMAL HIGH (ref 0.8–1.2)
Prothrombin Time: 15.7 seconds — ABNORMAL HIGH (ref 11.4–15.2)

## 2019-01-06 LAB — SARS CORONAVIRUS 2 BY RT PCR (HOSPITAL ORDER, PERFORMED IN ~~LOC~~ HOSPITAL LAB): SARS Coronavirus 2: NEGATIVE

## 2019-01-06 LAB — ETHANOL: Alcohol, Ethyl (B): <10 mg/dL (ref <10)

## 2019-01-06 LAB — FERRITIN: Ferritin: 54 ng/mL (ref 11–307)

## 2019-01-06 MED ORDER — DEXTROSE 50 % IV SOLN: 1.0000 | Freq: Once | INTRAVENOUS | Status: DC

## 2019-01-06 MED ORDER — DEXTROSE 5 % IV SOLN
INTRAVENOUS | Status: DC
  Administered 2019-01-06: 13:00:00 via INTRAVENOUS

## 2019-01-06 MED ORDER — ALBUTEROL SULFATE (2.5 MG/3ML) 0.083% IN NEBU: 2.5000 mg | INHALATION_SOLUTION | Freq: Four times a day (QID) | RESPIRATORY_TRACT | Status: DC | PRN

## 2019-01-06 MED ORDER — ASPIRIN EC 81 MG PO TBEC
81.0000 mg | DELAYED_RELEASE_TABLET | Freq: Every day | ORAL | Status: DC
  Administered 2019-01-06 – 2019-01-10 (×5): 81 mg via ORAL
  Filled 2019-01-06 (×5): qty 1

## 2019-01-06 MED ORDER — FOLIC ACID 1 MG PO TABS
1.0000 mg | ORAL_TABLET | Freq: Every day | ORAL | Status: DC
  Administered 2019-01-06 – 2019-01-10 (×5): 1 mg via ORAL
  Filled 2019-01-06 (×5): qty 1

## 2019-01-06 MED ORDER — DEXTROSE 5 % IV SOLN: INTRAVENOUS | Status: DC

## 2019-01-06 MED ORDER — ONDANSETRON HCL 4 MG/2ML IJ SOLN
4.0000 mg | Freq: Four times a day (QID) | INTRAMUSCULAR | Status: DC | PRN
  Filled 2019-01-06: qty 2

## 2019-01-06 MED ORDER — INSULIN ASPART 100 UNIT/ML ~~LOC~~ SOLN
0.0000 [IU] | Freq: Three times a day (TID) | SUBCUTANEOUS | Status: DC
  Administered 2019-01-06: 2 [IU] via SUBCUTANEOUS
  Administered 2019-01-07: 3 [IU] via SUBCUTANEOUS
  Administered 2019-01-07: 2 [IU] via SUBCUTANEOUS
  Administered 2019-01-07: 7 [IU] via SUBCUTANEOUS
  Administered 2019-01-08: 5 [IU] via SUBCUTANEOUS
  Administered 2019-01-09: 9 [IU] via SUBCUTANEOUS
  Administered 2019-01-10 (×2): 2 [IU] via SUBCUTANEOUS

## 2019-01-06 MED ORDER — DEXTROSE 10 % IV SOLN: INTRAVENOUS | Status: DC

## 2019-01-06 MED ORDER — HYDRALAZINE HCL 20 MG/ML IJ SOLN: 5.0000 mg | INTRAMUSCULAR | Status: DC | PRN

## 2019-01-06 MED ORDER — INSULIN ASPART 100 UNIT/ML ~~LOC~~ SOLN
0.0000 [IU] | Freq: Every day | SUBCUTANEOUS | Status: DC
  Administered 2019-01-09: 3 [IU] via SUBCUTANEOUS

## 2019-01-06 MED ORDER — GLUCOSE 40 % PO GEL
ORAL | Status: AC
  Administered 2019-01-06: 13:00:00
  Filled 2019-01-06: qty 1

## 2019-01-06 MED ORDER — INSULIN GLARGINE 100 UNIT/ML ~~LOC~~ SOLN
7.0000 [IU] | Freq: Every day | SUBCUTANEOUS | Status: DC
  Filled 2019-01-06: qty 0.07

## 2019-01-06 MED ORDER — OXYCODONE HCL 5 MG PO TABS: 5.0000 mg | ORAL_TABLET | Freq: Four times a day (QID) | ORAL | Status: DC | PRN

## 2019-01-06 MED ORDER — HEPARIN SODIUM (PORCINE) 5000 UNIT/ML IJ SOLN
5000.0000 [IU] | Freq: Three times a day (TID) | INTRAMUSCULAR | Status: DC
  Administered 2019-01-06 – 2019-01-10 (×13): 5000 [IU] via SUBCUTANEOUS
  Filled 2019-01-06 (×14): qty 1

## 2019-01-06 MED ORDER — OXYCODONE HCL 5 MG PO TABS
5.0000 mg | ORAL_TABLET | Freq: Four times a day (QID) | ORAL | Status: DC | PRN
  Administered 2019-01-08 – 2019-01-10 (×2): 5 mg via ORAL
  Filled 2019-01-06 (×2): qty 1

## 2019-01-06 MED ORDER — CILOSTAZOL 50 MG PO TABS
50.0000 mg | ORAL_TABLET | Freq: Two times a day (BID) | ORAL | Status: DC
  Administered 2019-01-06 – 2019-01-10 (×9): 50 mg via ORAL
  Filled 2019-01-06 (×11): qty 1

## 2019-01-06 MED ORDER — SODIUM CHLORIDE 0.9% FLUSH
3.0000 mL | Freq: Two times a day (BID) | INTRAVENOUS | Status: DC
  Administered 2019-01-06 – 2019-01-08 (×4): 3 mL via INTRAVENOUS

## 2019-01-06 MED ORDER — ALLOPURINOL 100 MG PO TABS
100.0000 mg | ORAL_TABLET | Freq: Every day | ORAL | Status: DC
  Administered 2019-01-06 – 2019-01-10 (×5): 100 mg via ORAL
  Filled 2019-01-06 (×5): qty 1

## 2019-01-06 MED ORDER — VITAMIN D (ERGOCALCIFEROL) 1.25 MG (50000 UNIT) PO CAPS
50000.0000 [IU] | ORAL_CAPSULE | ORAL | Status: DC
  Administered 2019-01-07: 50000 [IU] via ORAL
  Filled 2019-01-06: qty 1

## 2019-01-06 MED ORDER — PANTOPRAZOLE SODIUM 40 MG PO TBEC
40.0000 mg | DELAYED_RELEASE_TABLET | Freq: Every day | ORAL | Status: DC
  Administered 2019-01-06 – 2019-01-10 (×5): 40 mg via ORAL
  Filled 2019-01-06 (×5): qty 1

## 2019-01-06 MED ORDER — DEXTROSE 50 % IV SOLN
INTRAVENOUS | Status: AC
  Administered 2019-01-06: 13:00:00
  Filled 2019-01-06: qty 50

## 2019-01-06 MED ORDER — METOPROLOL TARTRATE 25 MG PO TABS
25.0000 mg | ORAL_TABLET | Freq: Two times a day (BID) | ORAL | Status: DC
  Administered 2019-01-06 – 2019-01-10 (×9): 25 mg via ORAL
  Filled 2019-01-06 (×9): qty 1

## 2019-01-06 MED ORDER — VITAMIN B-1 100 MG PO TABS
100.0000 mg | ORAL_TABLET | Freq: Every day | ORAL | Status: DC
  Administered 2019-01-06 – 2019-01-08 (×3): 100 mg via ORAL
  Filled 2019-01-06 (×3): qty 1

## 2019-01-06 NOTE — Progress Notes (Signed)
Pt diet changed due to pt not having her dentures and being unable to chew soft diet. Verbal order per Dr Wyline Copas.  Jerald Kief, RN

## 2019-01-06 NOTE — H&P (Signed)
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  Patient reports at baseline she lives alone, is retired from working for the [employer].  Reports she has not drank alcohol in over a year, ambulates with the use of a walker, has an aide that comes 2 hours on most days that assists her with her IADLs.  The evening prior to admission she reports that she had an episode of diaphoresis and trembling, almost passed out.  In addition she had right-sided chest pain described as sharp, lasted for less than 1 hour.  She is never had this pain before.  She called a friend of hers who came to her home, blood sugar check at that time was 292.  Other symptoms she reports is intermittent bleeding per rectum, although none today.  She reports having oral pain in her gums that she takes oxycodone for intermittently.  She identifies a primary care physician, she reports she has not seen in months.  The patient reports she does not know of her cancer diagnosis.  She has 4 children who are living, her father is also alive age 70 who lives locally.  She denies any pain at this time, no nausea, vomiting, no fevers.  ED Course: In the emergency department vital signs remarkable for normal heart rate, respiratory rate maximum 21, systolic blood pressure ranging from 100 to 139 mmHg.  Lab results remarkable for undetectable troponin.  Ammonia within normal limits.  Albumin of 1.8.   Potassium of 5.8.  Creatinine of 1.97.  Hemoglobin of 8.3.  White count of 12.9, neutrophil predominance.  CT scan of the head without contrast no acute findings.  Chest x-ray read as no active cardiopulmonary disease.  EKG without ST segment elevations.  Hospital medicine was consulted for further management.  Review of Systems: A complete ROS was obtained; pertinent positives negatives are denoted in the HPI. Otherwise, all systems are negative.   Past Medical History:  Diagnosis Date  . Acute venous embolism and thrombosis of deep vessels of proximal lower extremity ([place])   . Alcoholic cirrhosis ([place])   . Anemia   . Anxiety   . Arthritis   . Asthma   . Back pain   . Blood transfusion few years ago  . Cellulitis    bilateral lower extremities  . CHF (congestive heart failure) ([place]) 10/13   grade 1 diastolic dysfunction, Nl LVF  . Chronic diastolic congestive heart failure ([place])   . Chronic obstructive pulmonary disease (COPD) ([place])   . Colon polyps   . Depression   . Diabetes mellitus    insulin dep   . Gastroesophageal reflux   . GI bleeding 08/28/2018  . Glaucoma    left  . Gout   . Hepatitis    alcoholic hepatitis  . History of GI diverticular bleed   . Hypertension   . Leg swelling   . On home oxygen therapy    "2L just at night" (08/28/2018)  . Pneumonia    history of  .  PVD (peripheral vascular disease) ([place]) 2009   bilat iliac stenting  . Reflux esophagitis   . Villous adenoma of colon 05/10/11   Social History   Socioeconomic History  . Marital status: Legally Separated    Spouse name: Not on file  . Number of children: Not on file  . Years of education: Not on file  . Highest education level: Not on file  Occupational History  . Not on file  Social Needs  . Financial resource strain: Not on file  . Food insecurity:    Worry: Not on file    Inability: Not on file  . Transportation needs:    Medical: Not on file    Non-medical: Not on file  Tobacco  Use  . Smoking status: Current Some Day Smoker    Packs/day: 0.25    Years: 30.00    Pack years: 7.50    Types: Cigarettes    Last attempt to quit: 11/06/2005    Years since quitting: 13.1  . Smokeless tobacco: Never Used  Substance and Sexual Activity  . Alcohol use: No    Comment: stopped 2005  . Drug use: No  . Sexual activity: Not on file  Lifestyle  . Physical activity:    Days per week: Not on file    Minutes per session: Not on file  . Stress: Not on file  Relationships  . Social connections:    Talks on phone: Not on file    Gets together: Not on file    Attends religious service: Not on file    Active member of club or organization: Not on file    Attends meetings of clubs or organizations: Not on file    Relationship status: Not on file  . Intimate partner violence:    Fear of current or ex partner: Not on file    Emotionally abused: Not on file    Physically abused: Not on file    Forced sexual activity: Not on file  Other Topics Concern  . Not on file  Social History Narrative  . Not on file   Update to above, reports she does not smoke.  Family History  Problem Relation Age of Onset  . Cancer Brother        heent ca  . Cancer Sister        liver ca    Physical Exam: Vitals:   01/06/19 0415 01/06/19 0430 01/06/19 0445 01/06/19 0515  BP: 129/67 (!) 100/56 (!) 112/55 132/62  Pulse: 80 79 81 84  Resp: 19 (!) 9 19 18   Temp:      TempSrc:      SpO2: 100% 100% 100% 100%  Height:       General: Elderly black woman, pleasant and cooperative ENT: Grossly normal hearing, MMM.  No teeth. Cardiovascular: RRR. No M/R/G. No LE edema.  Respiratory: CTA bilaterally. No wheezes or crackles. Normal respiratory effort.  Breathing 2 L nasal cannula. Abdomen: Soft, non-tender.  Well-healed midline scar.  Mild distention. Skin: No rash or induration seen on limited exam. Musculoskeletal: Grossly normal tone BUE/BLE.  Frail.  Has bilateral calf tenderness to  palpation, she describes as chronic. Psychiatric: Grossly normal mood and affect. Neurologic: Moves all extremities in coordinated fashion.  Oriented to year-2020.  Answers questions appropriately. Lymph: query right sided cervical LAD, she declined extensive palpation due to self reported pain  I have personally reviewed the following labs, culture data, and imaging studies.  09/25/2018 liver bx: Diagnosis Liver,  needle/core biopsy - ADENOCARCINOMA, SEE NOTE. Diagnosis Note Immunohistochemical stains show that the tumor cells are positive for CK7 and negative for CK20, CDX-2, TTF-1, GATA3, Hep Par, arginase, and AFP. The differential diagnosis mainly includes an upper gastrointestinal and a pancreatobiliary primary. 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  EKG without ST segment changes and initial troponin WNL.   A/P: Given normal HR and lack of persistently elevated RR, doubt PE; however, at somewhat higher risk given cancer diagnosis.  Will obtain repeat troponin to rule out significant ischemia.  Monitor on telemetry.  Suspect it may be related to underlying malignancy.    #Adenocarcinoma in the liver Course: Underwent liver biopsy on 09/25/2018 that revealed adenocarcinoma, favored upper GI / pancreaticobiliary or primary cholangiocarcinoma.  MRI on 09/23/2018 revealed multiple lesions, dominant mass in segment 8 measuring 7.9 x 5.4 x 5.8 cm.   A/P: I relayed liver biopsy result to her at her request.  I offered to call her family, namely - her daughter, but she asked if the day team would do so. Will obtain staging CT chest and abdomen and pelvis (wo contrast given CKD). Obtaining tumor markers.  Consider oncology consult in the AM.  #Other problems: -Cirrhosis: does not appear  overtly decompensated at this time, on admission PT INR of 1.3, albumin 1.8 -HTN: continue home BB -Anemia: hb of 8.3 on admission, obtaining iron studies to further evaluate -COPD: on 2L  O2 qhs and as needed bronchodilator, continue -Chronic gingival pain:continue home oxycodone prn for pain -GERD: continue home PPI -Gout: not in exacerbation, continue allopurinol 100 mg -Hyperkalemia, mild: on admission K of 5.8; low potassium diet, trend with repeat at 11a on 01/06/2019, if elevated, provision of Kayexalate reasonable -Insulin dependent DM: she reports taking 10 units of basal insulin daily, but cannot recall rapid acting insulin regimen.  Will dose reduce basal insulin to 7 units and cover with rapid acting insulin on a correction scale. -Peripheral arterial disease, likely: continue home cilostazol -CKD: Cr appears at baseline (~2), monitor  DVT prophylaxis: Subq heparin Code Status: Full code  Disposition Plan: Anticipate D/C home in 2-5 days Consults called: none, consider oncology Admission status: admit to hospital medicine service   Cheri Rous, MD [facility] [phone]  If 7PM-7AM, please contact night-coverage [email or web address] Password [email or web address]  This document was created using the aid of voice recognition / dication software.

## 2019-01-06 NOTE — ED Provider Notes (Addendum)
[facility] EMERGENCY DEPARTMENT Provider Note   CSN: [id number] Arrival date & time: 01/06/19  0219    History   Chief Complaint Chief Complaint  Patient presents with   Dizziness   Chest Pain    HPI [name] is a 73 y.o. female.     Patient with h/o diabetes, alcoholic hepatitis, alcoholic cirrhosis, congestive heart failure --presents to the emergency department with complaint of dizziness which she describes as a feeling of passing out and right-sided chest pain.  Symptoms started this evening.  Patient states that she called the ambulance herself due to her symptoms.  Patient is a very poor historian and does not give details about her current problems or her history.  She tells me that she lives by herself and does not have anybody else to help take care of her.  She has had 2 previous admissions this year.  These admissions were subsequently followed by admissions to SNF.  No apparent history of dementia per chart.  Currently her "dizziness" is improved however she continues to complain of right-sided chest pain.     Past Medical History:  Diagnosis Date   Acute venous embolism and thrombosis of deep vessels of proximal lower extremity (HCC)    Alcoholic cirrhosis (HCC)    Anemia    Anxiety    Arthritis    Asthma    Back pain    Blood transfusion few years ago   Cellulitis    bilateral lower extremities   CHF (congestive heart failure) ([place]) 10/13   grade 1 diastolic dysfunction, Nl LVF   Chronic diastolic congestive heart failure (HCC)    Chronic obstructive pulmonary disease (COPD) ([place])    Colon polyps    Depression    Diabetes mellitus    insulin dep    Gastroesophageal reflux    GI bleeding 08/28/2018   Glaucoma    left   Gout    Hepatitis    alcoholic hepatitis   History of GI diverticular bleed    Hypertension    Leg swelling    On home oxygen therapy    "2L just at night" (08/28/2018)    Pneumonia    history of   PVD (peripheral vascular disease) ([place]) 2009   bilat iliac stenting   Reflux esophagitis    Villous adenoma of colon 05/10/11    Patient Active Problem List   Diagnosis Date Noted   Malnutrition of moderate degree 11/15/2018   Type II diabetes mellitus with renal manifestations ([place]) 11/12/2018   Tobacco abuse 11/12/2018   Generalized weakness 11/12/2018   Chronic respiratory failure with hypoxia ([place]) 11/12/2018   Internal hemorrhoid, bleeding 09/26/2018   Liver masses 09/26/2018   Leukocytosis 09/26/2018   AKI (acute kidney injury) ([place]) 08/28/2018   Gout 08/10/2015   Hypoglycemia    Hypothermia    UTI (lower urinary tract infection)    Humeral head fracture 06/05/2015   Idiopathic chronic gout of multiple sites without tophus 06/05/2015   Chronic renal disease, stage III ([place]) 06/05/2015   Nausea vomiting and diarrhea 05/09/2015   Increased urinary frequency 05/09/2015   Sepsis ([place]) 05/09/2015   Hypokalemia    Chronic diastolic congestive heart failure ([place])    Hyperlipidemia 02/10/2015   Cellulitis 11/04/2014   Essential hypertension 08/27/2014   Abdominal pain, acute, bilateral lower quadrant 07/26/2014   Colitis 07/26/2014   GI bleeding 06/14/2014   Bright red blood per rectum 06/14/2014   Acute encephalopathy 06/14/2014  Personal history of colonic polyps 08/06/2013   Knee pain, acute 05/20/2013   Claudication ([place]) 03/13/2013   Obesity 03/13/2013   PVD, LEIA/LIIA and RCIA pta 7/09- ABIs 0.68 and 0.69 Feb 2014 11/17/2012   PUD (peptic ulcer disease) GI bleed in past (2011) 11/17/2012   Cirrhosis of liver ([place]) 11/17/2012   Colon cancer, lap chole 2012 11/17/2012   DJD, multiple compression fractures noted on CXR 11/17/2012   Atypical chest pain- no history of CAD. Myoview low risk 2012 11/15/2012   Controlled diabetes mellitus type 2 with complications ([place]) 91/66/0600   Acute renal failure  superimposed on stage 3 chronic kidney disease ([place]) 06/16/2012   Incisional hernia 01/09/2012   Anemia 01/28/2011   COPD exacerbation ([place]) 01/28/2011   Sleep apnea 01/28/2011   Transfusion history 01/28/2011   Sinus problem 01/28/2011   Full dentures 01/28/2011    Past Surgical History:  Procedure Laterality Date   ABDOMINAL HYSTERECTOMY  yrs ago   BIOPSY  08/31/2018   Procedure: BIOPSY;  Surgeon: [name], MD;  Location: [facility];  Service: Gastroenterology;;   CATARACT EXTRACTION Right yrs ago   COLONOSCOPY     COLONOSCOPY WITH PROPOFOL N/A 08/06/2013   Procedure: COLONOSCOPY WITH PROPOFOL;  Surgeon: Lear Ng, MD;  Location: [facility];  Service: Endoscopy;  Laterality: N/A;   COLONOSCOPY WITH PROPOFOL N/A 08/31/2018   Procedure: COLONOSCOPY WITH PROPOFOL;  Surgeon: [name], MD;  Location: [facility];  Service: Gastroenterology;  Laterality: N/A;   ESOPHAGOGASTRODUODENOSCOPY (EGD) WITH PROPOFOL N/A 08/06/2013   Procedure: ESOPHAGOGASTRODUODENOSCOPY (EGD) WITH PROPOFOL;  Surgeon: Lear Ng, MD;  Location: [facility];  Service: Endoscopy;  Laterality: N/A;   ESOPHAGOGASTRODUODENOSCOPY (EGD) WITH PROPOFOL N/A 08/30/2018   Procedure: ESOPHAGOGASTRODUODENOSCOPY (EGD) WITH PROPOFOL;  Surgeon: [name], MD;  Location: [facility];  Service: Gastroenterology;  Laterality: N/A;   LAPAROSCOPIC ASSISSTED TOTAL COLECTOMY W/ J-POUCH  04/22/11   VENTRAL HERNIA REPAIR  06/13/2012   Procedure: LAPAROSCOPIC VENTRAL HERNIA;  Surgeon: Adin Hector, MD;  Location: [facility];  Service: General;  Laterality: N/A;  Laparoscopic Assisted Ventral Hernia with Mesh     OB History   No obstetric history on file.      Home Medications    Prior to Admission medications   Medication Sig Start Date End Date Taking? Authorizing Provider  acetaminophen (TYLENOL) 325 MG tablet Take 2 tablets (650 mg total) by mouth every 6 (six) hours as needed for mild  pain, fever or headache (or Fever >/= 101). 09/01/18   Roxan Hockey, MD  albuterol (PROVENTIL HFA;VENTOLIN HFA) 108 (90 BASE) MCG/ACT inhaler Inhale 2 puffs into the lungs every 6 (six) hours as needed. For shortness of breath    [provider]  albuterol (PROVENTIL) (2.5 MG/3ML) 0.083% nebulizer solution Take 2.5 mg by nebulization every 6 (six) hours as needed for wheezing or shortness of breath.    [provider]  allopurinol (ZYLOPRIM) 100 MG tablet Take 100 mg by mouth daily.  01/18/16   [provider]  aspirin 81 MG EC tablet Take 1 tablet (81 mg total) by mouth daily with breakfast. 09/01/18   Roxan Hockey, MD  cilostazol (PLETAL) 100 MG tablet Take 50 mg by mouth 2 (two) times daily. 08/07/18   [provider]  diclofenac sodium (VOLTAREN) 1 % GEL Apply 2 g topically 4 (four) times daily.  05/21/18   [provider]  ergocalciferol (VITAMIN D2) 50000 UNITS capsule Take 50,000 Units by mouth every Monday. On  Monday    [provider]  ferrous sulfate 325 (65 FE) MG tablet Take 325 mg by mouth daily with breakfast.    [provider]  fluticasone (FLONASE) 50 MCG/ACT nasal spray Place 2 sprays into the nose daily as needed for allergies.     [provider]  folic acid (FOLVITE) 1 MG tablet Take 1 tablet (1 mg total) by mouth daily. 08/18/15   Hongalgi, Lenis Dickinson, MD  hydrocortisone (ANUSOL-HC) 25 MG suppository Place 1 suppository (25 mg total) rectally 2 (two) times daily as needed for hemorrhoids or anal itching. 09/26/18   Norval Morton, MD  insulin aspart (NOVOLOG) 100 UNIT/ML injection Inject 0-9 Units into the skin 3 (three) times daily with meals. 0-9 Units, Subcutaneous, 3 times daily with meals, First dose (after last reorder) on Sun 11/18/18 at 1130 Correction coverage: Sensitive (thin, NPO, renal) CBG < 70: implement hypoglycemia protocol CBG 70 - 120: 0 units CBG 121 - 150: 1 unit CBG 151 - 200: 2 units CBG 201  - 250: 3 units CBG 251 - 300: 5 units CBG 301 - 350: 7 units CBG 351 - 400: 9 units CBG > 400: call MD and obtain STAT lab verification 11/19/18   Kayleen Memos, DO  insulin glargine (LANTUS) 100 UNIT/ML injection Inject 0.03 mLs (3 Units total) into the skin daily. 11/19/18   Kayleen Memos, DO  magnesium oxide (MAG-OX) 400 MG tablet Take 400 mg by mouth daily.    [provider]  metoprolol tartrate (LOPRESSOR) 50 MG tablet Take 1 tablet (50 mg total) by mouth 2 (two) times daily. Patient taking differently: Take 25 mg by mouth 2 (two) times daily.  09/26/18   Fuller Plan A, MD  montelukast (SINGULAIR) 10 MG tablet Take 10 mg by mouth at bedtime.     [provider]  nicotine (NICODERM CQ - DOSED IN MG/24 HOURS) 21 mg/24hr patch Place 1 patch (21 mg total) onto the skin daily. 11/15/18   Kayleen Memos, DO  omeprazole (PRILOSEC) 40 MG capsule Take 40 mg by mouth daily.     [provider]  oxyCODONE (OXY IR/ROXICODONE) 5 MG immediate release tablet Take 1-2 tablets (5-10 mg total) by mouth every 6 (six) hours as needed for moderate pain or severe pain. 09/26/18   Norval Morton, MD  OXYGEN Inhale 2 L into the lungs at bedtime.    [provider]  simvastatin (ZOCOR) 10 MG tablet Take 10 mg by mouth daily.  11/30/14   [provider]  thiamine 100 MG tablet Take 1 tablet (100 mg total) by mouth daily. 08/18/15   Hongalgi, Lenis Dickinson, MD  triamcinolone ointment (KENALOG) 0.1 % Apply 1 application topically at bedtime.  05/21/18   [provider]    Family History Family History  Problem Relation Age of Onset   Cancer Brother        heent ca   Cancer Sister        liver ca    Social History Social History   Tobacco Use   Smoking status: Current Some Day Smoker    Packs/day: 0.25    Years: 30.00    Pack years: 7.50    Types: Cigarettes    Last attempt to quit: 11/06/2005    Years since quitting: 13.1   Smokeless tobacco: Never Used    Substance Use Topics   Alcohol use: No    Comment: stopped 2005   Drug use: No  Allergies   Compazine [prochlorperazine edisylate]; Penicillins; Phenergan [promethazine hcl]; Lipitor [atorvastatin]; Chocolate; Lactose intolerance (gi); Other; and Pork-derived products   Review of Systems Review of Systems  Constitutional: Negative for diaphoresis and fever.  Eyes: Negative for redness.  Respiratory: Negative for cough and shortness of breath.   Cardiovascular: Positive for chest pain and leg swelling. Negative for palpitations.  Gastrointestinal: Negative for abdominal pain, nausea and vomiting.  Genitourinary: Negative for dysuria.  Musculoskeletal: Negative for back pain and neck pain.  Skin: Negative for rash.  Neurological: Positive for dizziness and light-headedness. Negative for syncope.  Psychiatric/Behavioral: The patient is not nervous/anxious.      Physical Exam Updated Vital Signs BP 139/70 (BP Location: Right Arm)    Pulse 83    Temp 97.8 F (36.6 C) (Oral)    Resp (!) 21    SpO2 100%   Physical Exam Vitals signs and nursing note reviewed.  Constitutional:      Appearance: She is well-developed. She is not diaphoretic.  HENT:     Head: Normocephalic and atraumatic.     Right Ear: Tympanic membrane, ear canal and external ear normal.     Left Ear: Tympanic membrane, ear canal and external ear normal.     Nose: Nose normal.     Mouth/Throat:     Mouth: Mucous membranes are not dry.     Pharynx: Uvula midline.  Eyes:     General: Lids are normal.        Right eye: No discharge.        Left eye: No discharge.     Extraocular Movements:     Right eye: No nystagmus.     Left eye: No nystagmus.     Conjunctiva/sclera: Conjunctivae normal.     Pupils: Pupils are equal, round, and reactive to light.  Neck:     Musculoskeletal: Normal range of motion and neck supple. No muscular tenderness.     Vascular: Normal carotid pulses. No carotid bruit or JVD.      Trachea: Trachea normal. No tracheal deviation.  Cardiovascular:     Rate and Rhythm: Normal rate and regular rhythm.     Pulses: No decreased pulses.     Heart sounds: Normal heart sounds, S1 normal and S2 normal. No murmur.  Pulmonary:     Effort: Pulmonary effort is normal. No respiratory distress.     Breath sounds: Normal breath sounds. No wheezing.  Chest:     Chest wall: No tenderness.  Abdominal:     General: Bowel sounds are normal.     Palpations: Abdomen is soft.     Tenderness: There is no abdominal tenderness. There is no guarding or rebound.  Musculoskeletal: Normal range of motion.     Cervical back: She exhibits normal range of motion, no tenderness and no bony tenderness.     Right lower leg: She exhibits tenderness. Edema present.     Left lower leg: She exhibits tenderness. Edema present.     Comments: 1+ pitting edema, symmetric, bilateral calves. No cellulitis, redness, warmth. Generally tender to palpation.   Skin:    General: Skin is warm and dry.     Coloration: Skin is not pale.  Neurological:     Mental Status: She is alert and oriented to person, place, and time.     GCS: GCS eye subscore is 4. GCS verbal subscore is 5. GCS motor subscore is 6.     Cranial Nerves: No cranial nerve deficit.  Sensory: No sensory deficit.     Motor: Motor function is intact.      ED Treatments / Results  Labs (all labs ordered are listed, but only abnormal results are displayed) Labs Reviewed  PROTIME-INR - Abnormal; Notable for the following components:      Result Value   Prothrombin Time 15.7 (*)    INR 1.3 (*)    All other components within normal limits  CBC - Abnormal; Notable for the following components:   WBC 12.9 (*)    RBC 3.42 (*)    Hemoglobin 8.3 (*)    HCT 28.1 (*)    MCH 24.3 (*)    MCHC 29.5 (*)    RDW 17.2 (*)    All other components within normal limits  DIFFERENTIAL - Abnormal; Notable for the following components:   Neutro Abs 9.0  (*)    Monocytes Absolute 1.1 (*)    All other components within normal limits  COMPREHENSIVE METABOLIC PANEL - Abnormal; Notable for the following components:   Potassium 5.8 (*)    CO2 15 (*)    Glucose, Bld 315 (*)    BUN 24 (*)    Creatinine, Ser 1.97 (*)    Calcium 8.3 (*)    Total Protein 5.2 (*)    Albumin 1.8 (*)    Alkaline Phosphatase 262 (*)    GFR calc non Af Amer 24 (*)    GFR calc Af Amer 28 (*)    All other components within normal limits  AMMONIA - Abnormal; Notable for the following components:   Ammonia 38 (*)    All other components within normal limits  SARS CORONAVIRUS 2 (HOSPITAL ORDER, [facility])  APTT  TROPONIN I  ETHANOL  URINALYSIS, ROUTINE W REFLEX MICROSCOPIC  CANCER ANTIGEN 19-9  CEA  LACTATE DEHYDROGENASE  CA 125  TROPONIN I  HEMOGLOBIN A1C  FERRITIN  IRON AND TIBC  BASIC METABOLIC PANEL    EKG EKG Interpretation  Date/Time:  Sunday Jan 06 2019 02:32:35 EDT Ventricular Rate:  78 PR Interval:    QRS Duration: 82 QT Interval:  363 QTC Calculation: 414 R Axis:   19 Text Interpretation:  Normal sinus rhythm Artifact Borderline low voltage, extremity leads Abnormal R-wave progression, early transition No significant change since last tracing Confirmed by Addison Lank [phone]) on 01/06/2019 4:12:46 AM   Radiology Dg Chest 2 View  Result Date: 01/06/2019 CLINICAL DATA:  73 year old female with dizziness and chest pain. EXAM: CHEST - 2 VIEW COMPARISON:  Chest radiograph dated 11/12/2018 FINDINGS: There is emphysematous changes of the lungs. No focal consolidation, pleural effusion, or pneumothorax. The cardiac silhouette is within normal limits. No acute osseous pathology. Osteopenia. IMPRESSION: No active cardiopulmonary disease. Electronically Signed   By: Anner Crete M.D.   On: 01/06/2019 03:27   Ct Head Wo Contrast  Result Date: 01/06/2019 CLINICAL DATA:  73 year old female with dizziness. EXAM: CT HEAD  WITHOUT CONTRAST TECHNIQUE: Contiguous axial images were obtained from the base of the skull through the vertex without intravenous contrast. COMPARISON:  Head CT dated 11/12/2018 FINDINGS: Brain: There is mild age-related atrophy and chronic microvascular ischemic changes. There is no acute intracranial hemorrhage. No mass effect or midline shift. No extra-axial fluid collection. Vascular: No hyperdense vessel or unexpected calcification. Skull: No acute calvarial pathology. Diffuse tiny calvarial lucencies with a ?salt and pepper appearance? which may be related to underlying metabolic disease or hyperparathyroidism. Sinuses/Orbits: Chronic mucoperiosteal thickening and remodeling of  the right maxillary sinus. No air-fluid level. The remainder of the visualized paranasal sinuses and mastoid air cells are clear. Other: None IMPRESSION: 1. No acute intracranial hemorrhage. 2. Age-related atrophy and chronic microvascular ischemic changes. Electronically Signed   By: Anner Crete M.D.   On: 01/06/2019 03:49    Procedures Procedures (including critical care time)  Medications Ordered in ED Medications - No data to display   Initial Impression / Assessment and Plan / ED Course  I have reviewed the triage vital signs and the nursing notes.  Pertinent labs & imaging results that were available during my care of the patient were reviewed by me and considered in my medical decision making (see chart for details).        Patient seen and examined.  Patient presents this morning with very vague complaints of chest pain and dizziness which she describes mainly as a lightheadedness sensation.  Patient is a very poor historian and is difficult to obtain details regarding her history or her current symptoms during questioning.  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  I do not see any reference to this diagnosis or treatment/follow-up activities and subsequent notes.  I asked the patient about her biopsy that she had in January and she does not remember having one.   Vital signs reviewed and are as follows: BP 117/75    Pulse 73    Temp 97.8 F (36.6 C) (Oral)    Resp 12    Ht 5' (1.524 m)    SpO2 100%    BMI 22.03 kg/m   4:30 AM Patient discussed with and seen by Dr. Leonette Monarch.  Chronic kidney disease is at baseline.  Troponin is negative.  EKG without changes or signs of ischemia today.  Patient has mild hyperkalemia 5.7.  Alk phos continues to be elevated but is at baseline.  Hemoglobin is low but also appears to be at baseline.  There appears to be a nodule on the lateral chest x-ray however this is read as negative.  Patient with chest pain and reported near syncope with multiple risk factors.  Will discuss with hospitalist for admission.  Also pt with possible cholangiocarcinoma/liver mets which will need further eval and plan. Pt will benefit from social work/case management eval as well.   4:39 AM Spoke with Dr. Stana Bunting who will see patient.     Final Clinical Impressions(s) / ED Diagnoses   Final diagnoses:  Near syncope  Precordial pain  Liver masses  Chronic kidney disease, unspecified CKD stage  Hyperkalemia  Anemia, unspecified type   Admit for CP and near syncope with multiple risk factors.   ED Discharge Orders    None         Carlisle Cater, Hershal Coria 01/06/19 [id number]    Fatima Blank, MD 01/06/19 [phone]

## 2019-01-06 NOTE — ED Notes (Signed)
Mortimer Fries RN attempted two iv's at this time unsuccessful at this time.

## 2019-01-06 NOTE — ED Notes (Signed)
ED TO INPATIENT HANDOFF REPORT  ED Nurse Name and Phone #: Minna Merritts [phone]  S Name/Age/Gender [name] 73 y.o. female Room/Bed: 035C/035C  Code Status   Code Status: Prior  Home/SNF/Other Home Patient oriented to: self, place, time and situation Is this baseline? Yes   Triage Complete: Triage complete  Chief Complaint Dizziness  Triage Note Pt coming from home by ems. Pt started to have dizziness since 9pm last night and when pt arrived stop having dizziness. Pt currently having right sided chest pain since about 45 minutes ago when ems moved her right arm.    Allergies Allergies  Allergen Reactions  . Compazine [Prochlorperazine Edisylate] Other (See Comments)    Altered mental status  . Penicillins Hives    Has patient had a PCN reaction causing immediate rash, facial/tongue/throat swelling, SOB or lightheadedness with hypotension: Yes Has patient had a PCN reaction causing severe rash involving mucus membranes or skin necrosis: No Has patient had a PCN reaction that required hospitalization: No Has patient had a PCN reaction occurring within the last 10 years: No If all of the above answers are "NO", then may proceed with Cephalosporin use.   Marland Kitchen Phenergan [Promethazine Hcl] Other (See Comments)    Altered mental status  . Lipitor [Atorvastatin] Swelling    Body swells, but doesn't affect breathing  . Chocolate Diarrhea  . Lactose Intolerance (Gi) Diarrhea  . Other Other (See Comments)    Nuts- Diverticulitis  . Pork-Derived Products Other (See Comments)    Causes GOUT    Level of Care/Admitting Diagnosis ED Disposition    None      B Medical/Surgery History Past Medical History:  Diagnosis Date  . Acute venous embolism and thrombosis of deep vessels of proximal lower extremity ([place])   . Alcoholic cirrhosis ([place])   . Anemia   . Anxiety   . Arthritis   . Asthma   . Back pain   . Blood transfusion few years ago  . Cellulitis    bilateral lower  extremities  . CHF (congestive heart failure) ([place]) 10/13   grade 1 diastolic dysfunction, Nl LVF  . Chronic diastolic congestive heart failure ([place])   . Chronic obstructive pulmonary disease (COPD) ([place])   . Colon polyps   . Depression   . Diabetes mellitus    insulin dep   . Gastroesophageal reflux   . GI bleeding 08/28/2018  . Glaucoma    left  . Gout   . Hepatitis    alcoholic hepatitis  . History of GI diverticular bleed   . Hypertension   . Leg swelling   . On home oxygen therapy    "2L just at night" (08/28/2018)  . Pneumonia    history of  . PVD (peripheral vascular disease) ([place]) 2009   bilat iliac stenting  . Reflux esophagitis   . Villous adenoma of colon 05/10/11   Past Surgical History:  Procedure Laterality Date  . ABDOMINAL HYSTERECTOMY  yrs ago  . BIOPSY  08/31/2018   Procedure: BIOPSY;  Surgeon: [name], MD;  Location: [facility];  Service: Gastroenterology;;  . CATARACT EXTRACTION Right yrs ago  . COLONOSCOPY    . COLONOSCOPY WITH PROPOFOL N/A 08/06/2013   Procedure: COLONOSCOPY WITH PROPOFOL;  Surgeon: Lear Ng, MD;  Location: [facility];  Service: Endoscopy;  Laterality: N/A;  . COLONOSCOPY WITH PROPOFOL N/A 08/31/2018   Procedure: COLONOSCOPY WITH PROPOFOL;  Surgeon: [name], MD;  Location: [facility];  Service: Gastroenterology;  Laterality:  N/A;  . ESOPHAGOGASTRODUODENOSCOPY (EGD) WITH PROPOFOL N/A 08/06/2013   Procedure: ESOPHAGOGASTRODUODENOSCOPY (EGD) WITH PROPOFOL;  Surgeon: Lear Ng, MD;  Location: [facility];  Service: Endoscopy;  Laterality: N/A;  . ESOPHAGOGASTRODUODENOSCOPY (EGD) WITH PROPOFOL N/A 08/30/2018   Procedure: ESOPHAGOGASTRODUODENOSCOPY (EGD) WITH PROPOFOL;  Surgeon: [name], MD;  Location: [facility];  Service: Gastroenterology;  Laterality: N/A;  . LAPAROSCOPIC ASSISSTED TOTAL COLECTOMY W/ J-POUCH  04/22/11  . VENTRAL HERNIA REPAIR  06/13/2012   Procedure: LAPAROSCOPIC VENTRAL HERNIA;  Surgeon:  Adin Hector, MD;  Location: [facility];  Service: General;  Laterality: N/A;  Laparoscopic Assisted Ventral Hernia with Mesh     A IV Location/Drains/Wounds Patient Lines/Drains/Airways Status   Active Line/Drains/Airways    Name:   Placement date:   Placement time:   Site:   Days:   Incision (Closed) 09/25/18 Abdomen Right;Upper   09/25/18    1006     103          Intake/Output Last 24 hours No intake or output data in the 24 hours ending 01/06/19 0427  Labs/Imaging Results for orders placed or performed during the hospital encounter of 01/06/19 (from the past 48 hour(s))  Protime-INR     Status: Abnormal   Collection Time: 01/06/19  2:48 AM  Result Value Ref Range   Prothrombin Time 15.7 (H) 11.4 - 15.2 seconds   INR 1.3 (H) 0.8 - 1.2    Comment: (NOTE) INR goal varies based on device and disease states. Performed at [facility], [place] [address], [place], [place] [address]   APTT     Status: None   Collection Time: 01/06/19  2:48 AM  Result Value Ref Range   aPTT 33 24 - 36 seconds    Comment: Performed at [facility] [address] [place], [place] [address]  CBC     Status: Abnormal   Collection Time: 01/06/19  2:48 AM  Result Value Ref Range   WBC 12.9 (H) 4.0 - 10.5 K/uL   RBC 3.42 (L) 3.87 - 5.11 MIL/uL   Hemoglobin 8.3 (L) 12.0 - 15.0 g/dL   HCT 28.1 (L) 36.0 - 46.0 %   MCV 82.2 80.0 - 100.0 fL   MCH 24.3 (L) 26.0 - 34.0 pg   MCHC 29.5 (L) 30.0 - 36.0 g/dL   RDW 17.2 (H) 11.5 - 15.5 %   Platelets 340 150 - 400 K/uL   nRBC 0.0 0.0 - 0.2 %    Comment: Performed at [facility], [place] [address] [place], [place] [address]  Differential     Status: Abnormal   Collection Time: 01/06/19  2:48 AM  Result Value Ref Range   Neutrophils Relative % 70 %   Neutro Abs 9.0 (H) 1.7 - 7.7 K/uL   Lymphocytes Relative 20 %   Lymphs Abs 2.6 0.7 - 4.0 K/uL   Monocytes Relative 9 %   Monocytes Absolute 1.1 (H) 0.1 - 1.0 K/uL   Eosinophils Relative 0 %    Eosinophils Absolute 0.1 0.0 - 0.5 K/uL   Basophils Relative 0 %   Basophils Absolute 0.0 0.0 - 0.1 K/uL   Immature Granulocytes 1 %   Abs Immature Granulocytes 0.07 0.00 - 0.07 K/uL    Comment: Performed at [facility] [address], [place], [place] [address]  Comprehensive metabolic panel     Status: Abnormal   Collection Time: 01/06/19  2:48 AM  Result Value Ref Range  Sodium 135 135 - 145 mmol/L   Potassium 5.8 (H) 3.5 - 5.1 mmol/L   Chloride 110 98 - 111 mmol/L   CO2 15 (L) 22 - 32 mmol/L   Glucose, Bld 315 (H) 70 - 99 mg/dL   BUN 24 (H) 8 - 23 mg/dL   Creatinine, Ser 1.97 (H) 0.44 - 1.00 mg/dL   Calcium 8.3 (L) 8.9 - 10.3 mg/dL   Total Protein 5.2 (L) 6.5 - 8.1 g/dL   Albumin 1.8 (L) 3.5 - 5.0 g/dL   AST 26 15 - 41 U/L   ALT 17 0 - 44 U/L   Alkaline Phosphatase 262 (H) 38 - 126 U/L   Total Bilirubin 0.4 0.3 - 1.2 mg/dL   GFR calc non Af Amer 24 (L) >60 mL/min   GFR calc Af Amer 28 (L) >60 mL/min   Anion gap 10 5 - 15    Comment: Performed at [facility], [place] [address], [place], [place] [address]  Troponin I - ONCE - STAT     Status: None   Collection Time: 01/06/19  2:48 AM  Result Value Ref Range   Troponin I <0.03 <0.03 ng/mL    Comment: Performed at [facility] [address] [place], [place] [address]  Ethanol     Status: None   Collection Time: 01/06/19  2:52 AM  Result Value Ref Range   Alcohol, Ethyl (B) <10 <10 mg/dL    Comment: (NOTE) Lowest detectable limit for serum alcohol is 10 mg/dL. For medical purposes only. Performed at [facility], [place] [address] [place], [place] [address]   Ammonia     Status: Abnormal   Collection Time: 01/06/19  2:52 AM  Result Value Ref Range   Ammonia 38 (H) 9 - 35 umol/L    Comment: Performed at [facility], [place] [address], [place], [place] [address]   Dg Chest 2 View  Result Date: 01/06/2019 CLINICAL DATA:  73 year old female with dizziness and chest pain. EXAM: CHEST - 2  VIEW COMPARISON:  Chest radiograph dated 11/12/2018 FINDINGS: There is emphysematous changes of the lungs. No focal consolidation, pleural effusion, or pneumothorax. The cardiac silhouette is within normal limits. No acute osseous pathology. Osteopenia. IMPRESSION: No active cardiopulmonary disease. Electronically Signed   By: Anner Crete M.D.   On: 01/06/2019 03:27   Ct Head Wo Contrast  Result Date: 01/06/2019 CLINICAL DATA:  73 year old female with dizziness. EXAM: CT HEAD WITHOUT CONTRAST TECHNIQUE: Contiguous axial images were obtained from the base of the skull through the vertex without intravenous contrast. COMPARISON:  Head CT dated 11/12/2018 FINDINGS: Brain: There is mild age-related atrophy and chronic microvascular ischemic changes. There is no acute intracranial hemorrhage. No mass effect or midline shift. No extra-axial fluid collection. Vascular: No hyperdense vessel or unexpected calcification. Skull: No acute calvarial pathology. Diffuse tiny calvarial lucencies with a ?salt and pepper appearance? which may be related to underlying metabolic disease or hyperparathyroidism. Sinuses/Orbits: Chronic mucoperiosteal thickening and remodeling of the right maxillary sinus. No air-fluid level. The remainder of the visualized paranasal sinuses and mastoid air cells are clear. Other: None IMPRESSION: 1. No acute intracranial hemorrhage. 2. Age-related atrophy and chronic microvascular ischemic changes. Electronically Signed   By: Anner Crete M.D.   On: 01/06/2019 03:49    Pending Labs Unresulted Labs (From admission, onward)    Start     Ordered   01/06/19 0425  SARS Coronavirus 2 (CEPHEID - Performed in  [facility] hospital lab), Hosp Order  (Asymptomatic Patients Labs)  Once,   R    Question:  Rule Out  Answer:  Yes   01/06/19 0424   01/06/19 0233  Urinalysis, Routine w reflex microscopic  ONCE - STAT,   STAT     01/06/19 0234          Vitals/Pain Today's Vitals   01/06/19  0231 01/06/19 0245 01/06/19 0303 01/06/19 0400  BP: 139/70 117/75  (!) 122/58  Pulse: 83 73  83  Resp: (!) 21 12  15   Temp: 97.8 F (36.6 C)     TempSrc: Oral     SpO2: 100% 100%  100%  Height:   5' (1.524 m)   PainSc:        Isolation Precautions No active isolations  Medications Medications - No data to display  Mobility walks Low fall risk   Focused Assessments Neuro Assessment Handoff:  Swallow screen pass? Yes  Cardiac Rhythm: Normal sinus rhythm NIH Stroke Scale ( + Modified Stroke Scale Criteria)  Interval: Initial Level of Consciousness (1a.)   : Alert, keenly responsive LOC Questions (1b. )   +: Answers both questions correctly LOC Commands (1c. )   + : Performs both tasks correctly Best Gaze (2. )  +: Normal Visual (3. )  +: No visual loss Facial Palsy (4. )    : Normal symmetrical movements Motor Arm, Left (5a. )   +: No drift Motor Arm, Right (5b. )   +: No drift Motor Leg, Left (6a. )   +: No drift Motor Leg, Right (6b. )   +: No drift Limb Ataxia (7. ): Absent Sensory (8. )   +: Normal, no sensory loss Best Language (9. )   +: No aphasia Dysarthria (10. ): Normal Extinction/Inattention (11.)   +: No Abnormality Modified SS Total  +: 0 Complete NIHSS TOTAL: 0     Neuro Assessment: Exceptions to WDL Neuro Checks:   Initial (01/06/19 0250)  Last Documented NIHSS Modified Score: 0 (01/06/19 0250) Has TPA been given? No If patient is a Neuro Trauma and patient is going to OR before floor call report to [facility] nurse: [phone] or [phone]     R Recommendations: See Admitting Provider Note  Report given to:   Additional Notes:

## 2019-01-06 NOTE — Progress Notes (Signed)
See separate consult note

## 2019-01-06 NOTE — Progress Notes (Signed)
Patient arrived to unit.  Placed on telemetry. Yellow socks on and bed alarm on.  Patient drinking second contrast drink for CT.

## 2019-01-06 NOTE — Progress Notes (Addendum)
PROGRESS NOTE    [name]  ZHG:[id number] DOB: 1946/05/04 DOA: 01/06/2019 PCP: Nolene Ebbs, MD    Brief Narrative:  73 year old woman with medical problems including cirrhosis, former alcohol abuse, insulin-dependent diabetes, chronic kidney disease, anemia, COPD on 2 L nasal cannula O2 nightly, and a diagnosis of adenocarcinoma made on a liver biopsy in January 2020, who presents with sweating and right-sided chest pain.  Patient reports at baseline she lives alone, is retired from working for the [employer].  Reports she has not drank alcohol in over a year, ambulates with the use of a walker, has an aide that comes 2 hours on most days that assists her with her IADLs.  The evening prior to admission she reports that she had an episode of diaphoresis and trembling, almost passed out.  In addition she had right-sided chest pain described as sharp, lasted for less than 1 hour.  She is never had this pain before.  She called a friend of hers who came to her home, blood sugar check at that time was 292.  Other symptoms she reports is intermittent bleeding per rectum, although none today.  She reports having oral pain in her gums that she takes oxycodone for intermittently.  She identifies a primary care physician, she reports she has not seen in months.  The patient reports she does not know of her cancer diagnosis.  She has 4 children who are living, her father is also alive age 86 who lives locally.  She denies any pain at this time, no nausea, vomiting, no fevers.  ED Course: In the emergency department vital signs remarkable for normal heart rate, respiratory rate maximum 21, systolic blood pressure ranging from 100 to 139 mmHg.  Lab results remarkable for undetectable troponin.  Ammonia within normal limits.  Albumin of 1.8.  Potassium of 5.8.  Creatinine of 1.97.  Hemoglobin of 8.3.  White count of 12.9, neutrophil predominance.  CT scan of the head without contrast no acute  findings.  Chest x-ray read as no active cardiopulmonary disease.  EKG without ST segment elevations.  Hospital medicine was consulted for further management.  Assessment & Plan:   Active Problems:   Chest pain   #Transient right sided CP -Noted to be of single occurrence, lasted < 60 minutes, associated with diaphoresis and pre-syncope.  EKG without ST segment changes and initial troponin WNL.   -Troponin neg    #Metastatic Adenocarcinoma involving lungs, liver, pancreatic tail -Underwent liver biopsy on 09/25/2018 that revealed adenocarcinoma, favored upper GI / pancreaticobiliary or primary cholangiocarcinoma.   -MRI on 09/23/2018 revealed multiple lesions, dominant mass in segment 8 measuring 7.9 x 5.4 x 5.8 cm.   -Personally reviewed CT abd/pelvis, chest with findings of multiple metastatic foci present. Most notably, liver lesion significantly enlarged over prior imaging and 3cm lesion at tail of pancreas -Discussed case with Oncology who will see in consultation  #Cirrhosis:  -Presented with  PT INR of 1.3, albumin 1.8  #HTN:  -BP appears to be stable at present -continue home BB as tolerated  #Anemia:  -hb of 8.3 on admission -obtaining iron studies to further evaluate  #COPD:  -Pt noted to be on 2L [place] O2 qhs and as needed bronchodilator -Presently with minimal O2 support  #Chronic gingival pain: -continue home oxycodone prn for pain  #GERD:  -continue home PPI as tolerated  #Gout:  -No evidence of acute exacerbation, continue allopurinol 100 mg  #Hyperkalemia -Potassium on admission K of 5.8;  -  Repeat bmet in AM  -Insulin dependent DM with symptomatic hypoglycemia -Pt reported taking 10 units of basal insulin daily, but cannot recall rapid acting insulin regimen. -This AM, pt noted to be profoundly hypoglycemic with glucose in the 30's, symptomatic -little improvement with oral glucose load. Will start on continuous D5 fluids with PRN D50 -Hold scheduled  insulin  #Peripheral arterial disease -continue home cilostazol as tolerated  #CKD:  -Baseline Cr noted to be around 2 -Will repeat BMET in AM  #Toxic metabolic encephalopathy -Markedly confused this AM, unable to converse -Likely secondary to symptomatic hypoglycemia -Start dextrose IVF per above  DVT prophylaxis: Heparin subq Code Status: Full Family Communication: Pt in room, family not at bedside, updated family over phone Disposition Plan: Uncertain at this time  Consultants:   Oncology  Procedures:     Antimicrobials: Anti-infectives (From admission, onward)   None       Subjective: Unable to assess as pt is encephalopathic  Objective: Vitals:   01/06/19 0545 01/06/19 0651 01/06/19 0916 01/06/19 0934  BP: 136/86 (!) 190/101 (!) 190/101 (!) 146/81  Pulse: 82 78 78 72  Resp: 17 20  20   Temp:  97.6 F (36.4 C)  97.8 F (36.6 C)  TempSrc:    Oral  SpO2: 100% 95%  100%  Weight:  53 kg    Height:  5' (1.524 m)      Intake/Output Summary (Last 24 hours) at 01/06/2019 1319 Last data filed at 01/06/2019 0900 Gross per 24 hour  Intake 0 ml  Output --  Net 0 ml   Filed Weights   01/06/19 0651  Weight: 53 kg    Examination:  General exam: Appears calm and comfortable  Respiratory system: Clear to auscultation. Respiratory effort normal. Cardiovascular system: S1 & S2 heard, RRR Gastrointestinal system: Abdomen mildly distended, decreased BS Central nervous system: confused. No focal neurological deficits. Extremities: Symmetric 5 x 5 power. Skin: No rashes, lesions  Psychiatry: unable to assess given current mental status.   Data Reviewed: I have personally reviewed following labs and imaging studies  CBC: Recent Labs  Lab 01/06/19 0248  WBC 12.9*  NEUTROABS 9.0*  HGB 8.3*  HCT 28.1*  MCV 82.2  PLT 465   Basic Metabolic Panel: Recent Labs  Lab 01/06/19 0248 01/06/19 1127  NA 135 132*  K 5.8* 4.4  CL 110 107  CO2 15* 18*   GLUCOSE 315* 37*  BUN 24* 22  CREATININE 1.97* 1.71*  CALCIUM 8.3* 7.8*   GFR: Estimated Creatinine Clearance: 20.7 mL/min (A) (by C-G formula based on SCr of 1.71 mg/dL (H)). Liver Function Tests: Recent Labs  Lab 01/06/19 0248  AST 26  ALT 17  ALKPHOS 262*  BILITOT 0.4  PROT 5.2*  ALBUMIN 1.8*   No results for input(s): LIPASE, AMYLASE in the last 168 hours. Recent Labs  Lab 01/06/19 0252  AMMONIA 38*   Coagulation Profile: Recent Labs  Lab 01/06/19 0248  INR 1.3*   Cardiac Enzymes: Recent Labs  Lab 01/06/19 0248 01/06/19 0534  TROPONINI <0.03 <0.03   BNP (last 3 results) No results for input(s): PROBNP in the last 8760 hours. HbA1C: Recent Labs    01/06/19 0534  HGBA1C 8.4*   CBG: Recent Labs  Lab 01/06/19 0700 01/06/19 1221 01/06/19 1245 01/06/19 1303 01/06/19 1311  GLUCAP 166* 22* 27* 12* 22*   Lipid Profile: No results for input(s): CHOL, HDL, LDLCALC, TRIG, CHOLHDL, LDLDIRECT in the last 72 hours. Thyroid Function Tests: No results  for input(s): TSH, T4TOTAL, FREET4, T3FREE, THYROIDAB in the last 72 hours. Anemia Panel: Recent Labs    01/06/19 0534  FERRITIN 54  TIBC 113*  IRON 16*   Sepsis Labs: No results for input(s): PROCALCITON, LATICACIDVEN in the last 168 hours.  Recent Results (from the past 240 hour(s))  SARS Coronavirus 2 (CEPHEID - Performed in [place] hospital lab), Hosp Order     Status: None   Collection Time: 01/06/19  4:39 AM  Result Value Ref Range Status   SARS Coronavirus 2 NEGATIVE NEGATIVE Final    Comment: (NOTE) If result is NEGATIVE SARS-CoV-2 target nucleic acids are NOT DETECTED. The SARS-CoV-2 RNA is generally detectable in upper and lower  respiratory specimens during the acute phase of infection. The lowest  concentration of SARS-CoV-2 viral copies this assay can detect is 250  copies / mL. A negative result does not preclude SARS-CoV-2 infection  and should not be used as the sole basis for  treatment or other  patient management decisions.  A negative result may occur with  improper specimen collection / handling, submission of specimen other  than nasopharyngeal swab, presence of viral mutation(s) within the  areas targeted by this assay, and inadequate number of viral copies  (<250 copies / mL). A negative result must be combined with clinical  observations, patient history, and epidemiological information. If result is POSITIVE SARS-CoV-2 target nucleic acids are DETECTED. The SARS-CoV-2 RNA is generally detectable in upper and lower  respiratory specimens dur ing the acute phase of infection.  Positive  results are indicative of active infection with SARS-CoV-2.  Clinical  correlation with patient history and other diagnostic information is  necessary to determine patient infection status.  Positive results do  not rule out bacterial infection or co-infection with other viruses. If result is PRESUMPTIVE POSTIVE SARS-CoV-2 nucleic acids MAY BE PRESENT.   A presumptive positive result was obtained on the submitted specimen  and confirmed on repeat testing.  While 2019 novel coronavirus  (SARS-CoV-2) nucleic acids may be present in the submitted sample  additional confirmatory testing may be necessary for epidemiological  and / or clinical management purposes  to differentiate between  SARS-CoV-2 and other Sarbecovirus currently known to infect humans.  If clinically indicated additional testing with an alternate test  methodology [phone]) is advised. The SARS-CoV-2 RNA is generally  detectable in upper and lower respiratory sp ecimens during the acute  phase of infection. The expected result is Negative. Fact Sheet for Patients:  StrictlyIdeas.no Fact Sheet for Healthcare Providers: BankingDealers.co.za This test is not yet approved or cleared by the [place] FDA and has been authorized for detection and/or  diagnosis of SARS-CoV-2 by FDA under an Emergency Use Authorization (EUA).  This EUA will remain in effect (meaning this test can be used) for the duration of the COVID-19 declaration under Section 564(b)(1) of the Act, 21 U.S.C. section 360bbb-3(b)(1), unless the authorization is terminated or revoked sooner. Performed at [facility], [place] [address], [place],  [address]      Radiology Studies: Ct Abdomen Pelvis Wo Contrast  Result Date: 01/06/2019 CLINICAL DATA:  Occult primary, adenocarcinoma or carcinoma not otherwise specified. EXAM: CT CHEST, ABDOMEN AND PELVIS WITHOUT CONTRAST TECHNIQUE: Multidetector CT imaging of the chest, abdomen and pelvis was performed following the standard protocol without IV contrast. COMPARISON:  MRI of September 23, 2018.  CT scan of November 30, 2017. FINDINGS: CT CHEST FINDINGS Cardiovascular: Atherosclerosis of thoracic aorta is noted without  aneurysm formation. Normal cardiac size. Coronary artery calcifications are noted. No pericardial effusion. Mediastinum/Nodes: Small sliding-type hiatal hernia is noted. Thyroid gland is unremarkable. No adenopathy is noted. Lungs/Pleura: No pneumothorax or pleural effusion is noted. 1.9 cm lobulated mass is noted in superior segment of left lower lobe best seen on image number 47 of series 4. This is concerning for primary malignancy or possibly metastatic disease. 6 mm nodule is seen in right upper lobe best seen on image number 79 of series 4. Right lower lobe opacity is noted concerning for atelectasis or possibly pneumonia. Opacity is noted posteriorly in the right upper lobe concerning for possible pneumonia. Musculoskeletal: No chest wall mass or suspicious bone lesions identified. CT ABDOMEN PELVIS FINDINGS Hepatobiliary: No gallstones or biliary dilatation is noted. Evaluation of the patent parenchyma is limited due to the lack of intravenous contrast. However large ill-defined low density measuring 6.9 x 4.4  cm is noted in the right hepatic lobe which is significantly enlarged compared to prior exam and concerning for metastatic disease or malignancy. Pancreas: Diffuse atrophy is again noted with multiple parenchymal calcifications. Pancreatic ductal dilatation is again noted. Spleen: Normal in size without focal abnormality. Adrenals/Urinary Tract: Adrenal glands appear normal. Bilateral renal cysts are noted. Mild perinephric stranding is noted. No hydronephrosis or renal obstruction is noted. No renal or ureteral calculi are noted. Urinary bladder is unremarkable. Stomach/Bowel: Status post right hemicolectomy. Sigmoid diverticulosis is noted without inflammation. 3.0 x 2.5 cm rounded soft tissue density is seen between the pancreatic tail and stomach concerning for possible neoplasm or malignancy, best seen on image number 133 of series 5. There is no evidence of bowel obstruction or inflammation. Vascular/Lymphatic: Atherosclerosis of thoracic aorta is noted without aneurysm formation. Enlarged collateral veins are again noted in left retroperitoneal region. These appear to drain into dilated left ovarian vein. Reproductive: Status post hysterectomy. No adnexal masses. Other: No abdominal wall hernia or abnormality. No abdominopelvic ascites. Musculoskeletal: No acute or significant osseous findings. IMPRESSION: 1.9 cm nodule seen in superior segment of left lower lobe concerning for primary malignancy or metastatic disease. Also noted is 6 mm nodule in right upper lobe concerning for metastatic disease. Peripheral opacity is noted posteriorly in the right upper lobe concerning for pneumonia, although underlying neoplasm or malignancy cannot be excluded. Continued radiographic follow-up is recommended. Ill-defined low density measuring 6.9 x 4.4 cm is noted in right hepatic lobe which is significantly enlarged compared to prior exam, concerning for metastatic disease or malignancy. Evaluation of the liver is  limited due the lack of intravenous contrast. There is interval development of possible 3 cm rounded soft tissue density between the pancreatic tail and stomach concerning for possible neoplasm or malignancy. Further evaluation with MRI is recommended. Diffuse pancreatic atrophy is again noted with multiple parenchymal calcifications. Aortic Atherosclerosis (ICD10-I70.0). Electronically Signed   By: Marijo Conception M.D.   On: 01/06/2019 08:58   Dg Chest 2 View  Result Date: 01/06/2019 CLINICAL DATA:  73 year old female with dizziness and chest pain. EXAM: CHEST - 2 VIEW COMPARISON:  Chest radiograph dated 11/12/2018 FINDINGS: There is emphysematous changes of the lungs. No focal consolidation, pleural effusion, or pneumothorax. The cardiac silhouette is within normal limits. No acute osseous pathology. Osteopenia. IMPRESSION: No active cardiopulmonary disease. Electronically Signed   By: Anner Crete M.D.   On: 01/06/2019 03:27   Ct Head Wo Contrast  Result Date: 01/06/2019 CLINICAL DATA:  73 year old female with dizziness. EXAM: CT HEAD WITHOUT CONTRAST TECHNIQUE: Contiguous  axial images were obtained from the base of the skull through the vertex without intravenous contrast. COMPARISON:  Head CT dated 11/12/2018 FINDINGS: Brain: There is mild age-related atrophy and chronic microvascular ischemic changes. There is no acute intracranial hemorrhage. No mass effect or midline shift. No extra-axial fluid collection. Vascular: No hyperdense vessel or unexpected calcification. Skull: No acute calvarial pathology. Diffuse tiny calvarial lucencies with a ?salt and pepper appearance? which may be related to underlying metabolic disease or hyperparathyroidism. Sinuses/Orbits: Chronic mucoperiosteal thickening and remodeling of the right maxillary sinus. No air-fluid level. The remainder of the visualized paranasal sinuses and mastoid air cells are clear. Other: None IMPRESSION: 1. No acute intracranial  hemorrhage. 2. Age-related atrophy and chronic microvascular ischemic changes. Electronically Signed   By: Anner Crete M.D.   On: 01/06/2019 03:49   Ct Chest Wo Contrast  Result Date: 01/06/2019 CLINICAL DATA:  Occult primary, adenocarcinoma or carcinoma not otherwise specified. EXAM: CT CHEST, ABDOMEN AND PELVIS WITHOUT CONTRAST TECHNIQUE: Multidetector CT imaging of the chest, abdomen and pelvis was performed following the standard protocol without IV contrast. COMPARISON:  MRI of September 23, 2018.  CT scan of November 30, 2017. FINDINGS: CT CHEST FINDINGS Cardiovascular: Atherosclerosis of thoracic aorta is noted without aneurysm formation. Normal cardiac size. Coronary artery calcifications are noted. No pericardial effusion. Mediastinum/Nodes: Small sliding-type hiatal hernia is noted. Thyroid gland is unremarkable. No adenopathy is noted. Lungs/Pleura: No pneumothorax or pleural effusion is noted. 1.9 cm lobulated mass is noted in superior segment of left lower lobe best seen on image number 47 of series 4. This is concerning for primary malignancy or possibly metastatic disease. 6 mm nodule is seen in right upper lobe best seen on image number 79 of series 4. Right lower lobe opacity is noted concerning for atelectasis or possibly pneumonia. Opacity is noted posteriorly in the right upper lobe concerning for possible pneumonia. Musculoskeletal: No chest wall mass or suspicious bone lesions identified. CT ABDOMEN PELVIS FINDINGS Hepatobiliary: No gallstones or biliary dilatation is noted. Evaluation of the patent parenchyma is limited due to the lack of intravenous contrast. However large ill-defined low density measuring 6.9 x 4.4 cm is noted in the right hepatic lobe which is significantly enlarged compared to prior exam and concerning for metastatic disease or malignancy. Pancreas: Diffuse atrophy is again noted with multiple parenchymal calcifications. Pancreatic ductal dilatation is again noted.  Spleen: Normal in size without focal abnormality. Adrenals/Urinary Tract: Adrenal glands appear normal. Bilateral renal cysts are noted. Mild perinephric stranding is noted. No hydronephrosis or renal obstruction is noted. No renal or ureteral calculi are noted. Urinary bladder is unremarkable. Stomach/Bowel: Status post right hemicolectomy. Sigmoid diverticulosis is noted without inflammation. 3.0 x 2.5 cm rounded soft tissue density is seen between the pancreatic tail and stomach concerning for possible neoplasm or malignancy, best seen on image number 133 of series 5. There is no evidence of bowel obstruction or inflammation. Vascular/Lymphatic: Atherosclerosis of thoracic aorta is noted without aneurysm formation. Enlarged collateral veins are again noted in left retroperitoneal region. These appear to drain into dilated left ovarian vein. Reproductive: Status post hysterectomy. No adnexal masses. Other: No abdominal wall hernia or abnormality. No abdominopelvic ascites. Musculoskeletal: No acute or significant osseous findings. IMPRESSION: 1.9 cm nodule seen in superior segment of left lower lobe concerning for primary malignancy or metastatic disease. Also noted is 6 mm nodule in right upper lobe concerning for metastatic disease. Peripheral opacity is noted posteriorly in the right upper lobe concerning for  pneumonia, although underlying neoplasm or malignancy cannot be excluded. Continued radiographic follow-up is recommended. Ill-defined low density measuring 6.9 x 4.4 cm is noted in right hepatic lobe which is significantly enlarged compared to prior exam, concerning for metastatic disease or malignancy. Evaluation of the liver is limited due the lack of intravenous contrast. There is interval development of possible 3 cm rounded soft tissue density between the pancreatic tail and stomach concerning for possible neoplasm or malignancy. Further evaluation with MRI is recommended. Diffuse pancreatic atrophy  is again noted with multiple parenchymal calcifications. Aortic Atherosclerosis (ICD10-I70.0). Electronically Signed   By: Marijo Conception M.D.   On: 01/06/2019 08:58    Scheduled Meds:  allopurinol  100 mg Oral Daily   aspirin EC  81 mg Oral Q breakfast   cilostazol  50 mg Oral BID   dextrose  1 ampule Intravenous Once   folic acid  1 mg Oral Daily   heparin  5,000 Units Subcutaneous Q8H   insulin aspart  0-5 Units Subcutaneous QHS   insulin aspart  0-9 Units Subcutaneous TID WC   metoprolol tartrate  25 mg Oral BID   pantoprazole  40 mg Oral Daily   sodium chloride flush  3 mL Intravenous Q12H   thiamine  100 mg Oral Daily   [START ON 01/07/2019] Vitamin D (Ergocalciferol)  50,000 Units Oral Q Mon   Continuous Infusions:  dextrose 75 mL/hr at 01/06/19 1233     LOS: 0 days   Marylu Lund, MD [facility] Pager On Amion  If 7PM-7AM, please contact night-coverage 01/06/2019, 1:19 PM

## 2019-01-06 NOTE — Progress Notes (Signed)
Patient arrived to unit BP 190/101.  RN paged [facility] on call to notify.

## 2019-01-06 NOTE — Consult Note (Signed)
[facility]  [phone] [phone]) [phone]     ID: [name] DOB: [birth year]  MR#: [id number]  WHQ#:[id number]  Patient Care Team: Nolene Ebbs, MD as PCP - General (Internal Medicine) Chauncey Cruel, MD OTHER MD:  CHIEF COMPLAINT: metastatic cholangiocarcinoma  CURRENT TREATMENT: palliative care consultation placed   HISTORY OF CURRENT ILLNESS: [name] was seen in the ED April 2019 with vomiting and abdominal pain. A CT of the abd/pelvis with contrast 12/01/2018 showed no definitive cause but noted a possible hypodense mass in the medial right lobe of the liver measuring 2.5 cm. Abd MRI was recommended and the patient was encouraged to see her PCP.   On 08/29/2019 the patient presented to the ED with GIB. She underwent EGD 08/30/2018 and colonoscopy 08/31/2018, neither showing an obvious cause of the bleed. The possible liver mass was not addressed.  On 09/21/2018 the patient again presented with BRBPR, felt on exam to be due to hemorrhoids. Dr Alessandra Bevels note prior abd CT report and proceeded to liver MRI W/WO contrast. This showed the Right liver mass now measuring 7.9 cm with approximately a dozen satellite masses. Biopsy was obtained 09/23/2018 showing metastatic adenocarcinoma c/e a biliary primary. Note that results were pending at the time of discharge. The final diagnosis had not been entered in the problem list and oncology consultation had not been requested.  On 11/12/2018 the patient was re-admitted with generalized weakness. The diagnosis of metastatic adenocarcinoma was not noted. She was discharged to SNF on 11/15/2018.  [name] was readmitted 01/06/2019 with right-sided chest pain and near syncope. The prior biopsy results were reviewed and we were asked to see the patient for further evaluation and management.   INTERVAL HISTORY: I met with the patient in her hospital room 01/06/2019. No family was present.   REVIEW OF  SYSTEMS: The patient states she is "all right." Denies pain, nausea, vomiting or bleeding problems. However she was lethargic, failed to answer multiple questions, and was not able to provide a full ROS  PAST MEDICAL HISTORY: Past Medical History:  Diagnosis Date  . Acute venous embolism and thrombosis of deep vessels of proximal lower extremity ([place])   . Alcoholic cirrhosis ([place])   . Anemia   . Anxiety   . Arthritis   . Asthma   . Back pain   . Blood transfusion few years ago  . Cellulitis    bilateral lower extremities  . CHF (congestive heart failure) ([place]) 10/13   grade 1 diastolic dysfunction, Nl LVF  . Chronic diastolic congestive heart failure ([place])   . Chronic obstructive pulmonary disease (COPD) ([place])   . Colon polyps   . Depression   . Diabetes mellitus    insulin dep   . Gastroesophageal reflux   . GI bleeding 08/28/2018  . Glaucoma    left  . Gout   . Hepatitis    alcoholic hepatitis  . History of GI diverticular bleed   . Hypertension   . Leg swelling   . On home oxygen therapy    "2L just at night" (08/28/2018)  . Pneumonia    history of  . PVD (peripheral vascular disease) ([place]) 2009   bilat iliac stenting  . Reflux esophagitis   . Villous adenoma of colon 05/10/11    PAST SURGICAL HISTORY: Past Surgical History:  Procedure Laterality Date  . ABDOMINAL HYSTERECTOMY  yrs ago  . BIOPSY  08/31/2018   Procedure: BIOPSY;  Surgeon: [name], MD;  [facility];  Service: Gastroenterology;;  . CATARACT EXTRACTION Right yrs ago  . COLONOSCOPY    . COLONOSCOPY WITH PROPOFOL N/A 08/06/2013   Procedure: COLONOSCOPY WITH PROPOFOL;  Surgeon: Lear Ng, MD;  Location: [facility];  Service: Endoscopy;  Laterality: N/A;  . COLONOSCOPY WITH PROPOFOL N/A 08/31/2018   Procedure: COLONOSCOPY WITH PROPOFOL;  Surgeon: [name], MD;  Location: [facility];  Service: Gastroenterology;  Laterality: N/A;  . ESOPHAGOGASTRODUODENOSCOPY (EGD) WITH  PROPOFOL N/A 08/06/2013   Procedure: ESOPHAGOGASTRODUODENOSCOPY (EGD) WITH PROPOFOL;  Surgeon: Lear Ng, MD;  [facility];  Service: Endoscopy;  Laterality: N/A;  . ESOPHAGOGASTRODUODENOSCOPY (EGD) WITH PROPOFOL N/A 08/30/2018   Procedure: ESOPHAGOGASTRODUODENOSCOPY (EGD) WITH PROPOFOL;  Surgeon: [name], MD;  Location: [facility];  Service: Gastroenterology;  Laterality: N/A;  . LAPAROSCOPIC ASSISSTED TOTAL COLECTOMY W/ J-POUCH  04/22/11  . VENTRAL HERNIA REPAIR  06/13/2012   Procedure: LAPAROSCOPIC VENTRAL HERNIA;  Surgeon: Adin Hector, MD;  Location: [facility];  Service: General;  Laterality: N/A;  Laparoscopic Assisted Ventral Hernia with Mesh    FAMILY HISTORY Family History  Problem Relation Age of Onset  . Cancer Brother        heent ca  . Cancer Sister        liver ca   SOCIAL HISTORY:  The patient tells me she lives alone and gives me her prior address. She states her children are grown. She was not able to give me their names.     ADVANCED DIRECTIVES: patient was not able to tell me if she has a HCPOA. Asked who she would want [place] to contact if she had a problem she says she takes care of her own problems.   HEALTH MAINTENANCE: Social History   Tobacco Use  . Smoking status: Current Some Day Smoker    Packs/day: 0.25    Years: 30.00    Pack years: 7.50    Types: Cigarettes    Last attempt to quit: 11/06/2005    Years since quitting: 13.1  . Smokeless tobacco: Never Used  Substance Use Topics  . Alcohol use: No    Comment: stopped 2005  . Drug use: No      Allergies  Allergen Reactions  . Compazine [Prochlorperazine Edisylate] Other (See Comments)    Altered mental status  . Penicillins Hives    Has patient had a PCN reaction causing immediate rash, facial/tongue/throat swelling, SOB or lightheadedness with hypotension: Yes Has patient had a PCN reaction causing severe rash involving mucus membranes or skin necrosis: No Has patient had  a PCN reaction that required hospitalization: No Has patient had a PCN reaction occurring within the last 10 years: No If all of the above answers are "NO", then may proceed with Cephalosporin use.   Marland Kitchen Phenergan [Promethazine Hcl] Other (See Comments)    Altered mental status  . Lipitor [Atorvastatin] Swelling    Body swells, but doesn't affect breathing  . Chocolate Diarrhea  . Lactose Intolerance (Gi) Diarrhea  . Other Other (See Comments)    Nuts- Diverticulitis  . Pork-Derived Products Other (See Comments)    Causes GOUT    Current Facility-Administered Medications  Medication Dose Route Frequency Provider Last Rate Last Dose  . albuterol (PROVENTIL) (2.5 MG/3ML) 0.083% nebulizer solution 2.5 mg  2.5 mg Nebulization Q6H PRN Vilma Prader, MD      . allopurinol (ZYLOPRIM) tablet 100 mg  100 mg Oral Daily Vilma Prader, MD   100 mg at  01/06/19 0915  . aspirin EC tablet 81 mg  81 mg Oral Q breakfast Vilma Prader, MD   81 mg at 01/06/19 9371  . cilostazol (PLETAL) tablet 50 mg  50 mg Oral BID Vilma Prader, MD   50 mg at 01/06/19 6967  . dextrose 5 % solution   Intravenous Continuous Donne Hazel, MD 75 mL/hr at 01/06/19 1510    . dextrose 50 % solution 50 mL  1 ampule Intravenous Once Donne Hazel, MD      . folic acid (FOLVITE) tablet 1 mg  1 mg Oral Daily Vilma Prader, MD   1 mg at 01/06/19 0916  . heparin injection 5,000 Units  5,000 Units Subcutaneous Q8H Vilma Prader, MD   5,000 Units at 01/06/19 1602  . hydrALAZINE (APRESOLINE) injection 5 mg  5 mg Intravenous Q4H PRN Donne Hazel, MD      . insulin aspart (novoLOG) injection 0-5 Units  0-5 Units Subcutaneous QHS Vilma Prader, MD      . insulin aspart (novoLOG) injection 0-9 Units  0-9 Units Subcutaneous TID WC Vilma Prader, MD   2 Units at 01/06/19 [phone]  . metoprolol tartrate (LOPRESSOR) tablet 25 mg  25 mg Oral BID Vilma Prader, MD   25 mg at 01/06/19 0916  .  ondansetron (ZOFRAN) injection 4 mg  4 mg Intravenous Q6H PRN Donne Hazel, MD      . oxyCODONE (Oxy IR/ROXICODONE) immediate release tablet 5 mg  5 mg Oral Q6H PRN Vilma Prader, MD      . pantoprazole (PROTONIX) EC tablet 40 mg  40 mg Oral Daily Vilma Prader, MD   40 mg at 01/06/19 0915  . sodium chloride flush (NS) 0.9 % injection 3 mL  3 mL Intravenous Q12H Vilma Prader, MD   3 mL at 01/06/19 0918  . thiamine (VITAMIN B-1) tablet 100 mg  100 mg Oral Daily Vilma Prader, MD   100 mg at 01/06/19 0915  . [START ON 01/07/2019] Vitamin D (Ergocalciferol) (DRISDOL) capsule 50,000 Units  50,000 Units Oral Q Mon Vilma Prader, MD        OBJECTIVE: middle aged African American woman examined in bed  Vitals:   01/06/19 0916 01/06/19 0934  BP: (!) 190/101 (!) 146/81  Pulse: 78 72  Resp:  20  Temp:  97.8 F (36.6 C)  SpO2:  100%     Body mass index is 22.83 kg/m.   Wt Readings from Last 3 Encounters:  01/06/19 116 lb 14.4 oz (53 kg)  11/13/18 112 lb 12.8 oz (51.2 kg)  09/21/18 119 lb 0.8 oz (54 kg)   Lungs no rales or rhonchi, auscultated anterolaterally Heart regular rate and rhythm Abd soft, nontender Neuro: non-focal, orientation not formally tested Breasts: deferred   LAB RESULTS:  CMP     Component Value Date/Time   NA 132 (L) 01/06/2019 1127   K 4.4 01/06/2019 1127   CL 107 01/06/2019 1127   CO2 18 (L) 01/06/2019 1127   GLUCOSE 37 (LL) 01/06/2019 1127   BUN 22 01/06/2019 1127   CREATININE 1.71 (H) 01/06/2019 1127   CREATININE 0.94 12/09/2014 1557   CALCIUM 7.8 (L) 01/06/2019 1127   PROT 5.2 (L) 01/06/2019 0248   ALBUMIN 1.8 (L) 01/06/2019 0248   AST 26 01/06/2019 0248   ALT 17 01/06/2019 0248   ALKPHOS 262 (H) 01/06/2019 0248   BILITOT 0.4 01/06/2019 0248  GFRNONAA 29 (L) 01/06/2019 1127   GFRAA 34 (L) 01/06/2019 1127    No results found for: TOTALPROTELP, ALBUMINELP, A1GS, A2GS, BETS, BETA2SER, GAMS, MSPIKE, SPEI  No results found  for: KPAFRELGTCHN, LAMBDASER, KAPLAMBRATIO  Lab Results  Component Value Date   WBC 12.9 (H) 01/06/2019   NEUTROABS 9.0 (H) 01/06/2019   HGB 8.3 (L) 01/06/2019   HCT 28.1 (L) 01/06/2019   MCV 82.2 01/06/2019   PLT 340 01/06/2019    '@LASTCHEMISTRY' @  No results found for: LABCA2  No components found for: YQMGNO037  Recent Labs  Lab 01/06/19 0248  INR 1.3*    No results found for: LABCA2  No results found for: CWU889  No results found for: VQX450  No results found for: TUU828  No results found for: CA2729  No components found for: HGQUANT  No results found for: CEA1 / No results found for: CEA1   Lab Results  Component Value Date   AFPTUMOR 4.3 09/25/2018    No results found for: CHROMOGRNA  No results found for: PSA1  Admission on 01/06/2019  Component Date Value Ref Range Status  . Prothrombin Time 01/06/2019 15.7* 11.4 - 15.2 seconds Final  . INR 01/06/2019 1.3* 0.8 - 1.2 Final   Comment: (NOTE) INR goal varies based on device and disease states. Performed at [facility], [place] [address] [place], [place] [address]   . aPTT 01/06/2019 33  24 - 36 seconds Final   Performed at [facility], [place] [address], [place], [place] [address]  . WBC 01/06/2019 12.9* 4.0 - 10.5 K/uL Final  . RBC 01/06/2019 3.42* 3.87 - 5.11 MIL/uL Final  . Hemoglobin 01/06/2019 8.3* 12.0 - 15.0 g/dL Final  . HCT 01/06/2019 28.1* 36.0 - 46.0 % Final  . MCV 01/06/2019 82.2  80.0 - 100.0 fL Final  . MCH 01/06/2019 24.3* 26.0 - 34.0 pg Final  . MCHC 01/06/2019 29.5* 30.0 - 36.0 g/dL Final  . RDW 01/06/2019 17.2* 11.5 - 15.5 % Final  . Platelets 01/06/2019 340  150 - 400 K/uL Final  . nRBC 01/06/2019 0.0  0.0 - 0.2 % Final   Performed at [facility] [address], [place], [place] [address]  . Neutrophils Relative % 01/06/2019 70  % Final  . Neutro Abs 01/06/2019 9.0* 1.7 - 7.7 K/uL Final  . Lymphocytes Relative 01/06/2019 20  % Final  . Lymphs Abs 01/06/2019  2.6  0.7 - 4.0 K/uL Final  . Monocytes Relative 01/06/2019 9  % Final  . Monocytes Absolute 01/06/2019 1.1* 0.1 - 1.0 K/uL Final  . Eosinophils Relative 01/06/2019 0  % Final  . Eosinophils Absolute 01/06/2019 0.1  0.0 - 0.5 K/uL Final  . Basophils Relative 01/06/2019 0  % Final  . Basophils Absolute 01/06/2019 0.0  0.0 - 0.1 K/uL Final  . Immature Granulocytes 01/06/2019 1  % Final  . Abs Immature Granulocytes 01/06/2019 0.07  0.00 - 0.07 K/uL Final   Performed at [facility], [place] [address], [place], [place] [address]  . Sodium 01/06/2019 135  135 - 145 mmol/L Final  . Potassium 01/06/2019 5.8* 3.5 - 5.1 mmol/L Final  . Chloride 01/06/2019 110  98 - 111 mmol/L Final  . CO2 01/06/2019 15* 22 - 32 mmol/L Final  . Glucose, Bld 01/06/2019 315* 70 - 99 mg/dL Final  . BUN 01/06/2019 24* 8 - 23 mg/dL Final  . Creatinine, Ser 01/06/2019 1.97* 0.44 - 1.00 mg/dL Final  .  Calcium 01/06/2019 8.3* 8.9 - 10.3 mg/dL Final  . Total Protein 01/06/2019 5.2* 6.5 - 8.1 g/dL Final  . Albumin 01/06/2019 1.8* 3.5 - 5.0 g/dL Final  . AST 01/06/2019 26  15 - 41 U/L Final  . ALT 01/06/2019 17  0 - 44 U/L Final  . Alkaline Phosphatase 01/06/2019 262* 38 - 126 U/L Final  . Total Bilirubin 01/06/2019 0.4  0.3 - 1.2 mg/dL Final  . GFR calc non Af Amer 01/06/2019 24* >60 mL/min Final  . GFR calc Af Amer 01/06/2019 28* >60 mL/min Final  . Anion gap 01/06/2019 10  5 - 15 Final   Performed at [facility] [address], [place], [place] [address]  . Troponin I 01/06/2019 <0.03  <0.03 ng/mL Final   Performed at [facility], [place] [address] [place], [place] [address]  . Alcohol, Ethyl (B) 01/06/2019 <10  <10 mg/dL Final   Comment: (NOTE) Lowest detectable limit for serum alcohol is 10 mg/dL. For medical purposes only. Performed at [facility], [place] [address] [place], [place] [address]   . Ammonia 01/06/2019 38* 9 - 35 umol/L Final   Performed at [facility], [place] [address] [place], [place] [address]  . SARS Coronavirus 2 01/06/2019 NEGATIVE  NEGATIVE Final   Comment: (NOTE) If result is NEGATIVE SARS-CoV-2 target nucleic acids are NOT DETECTED. The SARS-CoV-2 RNA is generally detectable in upper and lower  respiratory specimens during the acute phase of infection. The lowest  concentration of SARS-CoV-2 viral copies this assay can detect is 250  copies / mL. A negative result does not preclude SARS-CoV-2 infection  and should not be used as the sole basis for treatment or other  patient management decisions.  A negative result may occur with  improper specimen collection / handling, submission of specimen other  than nasopharyngeal swab, presence of viral mutation(s) within the  areas targeted by this assay, and inadequate number of viral copies  (<250 copies / mL). A negative result must be combined with clinical  observations, patient history, and epidemiological information. If result is POSITIVE SARS-CoV-2 target nucleic acids are DETECTED. The SARS-CoV-2 RNA is generally detectable in upper and lower  respiratory specimens dur                          ing the acute phase of infection.  Positive  results are indicative of active infection with SARS-CoV-2.  Clinical  correlation with patient history and other diagnostic information is  necessary to determine patient infection status.  Positive results do  not rule out bacterial infection or co-infection with other viruses. If result is PRESUMPTIVE POSTIVE SARS-CoV-2 nucleic acids MAY BE PRESENT.   A presumptive positive result was obtained on the submitted specimen  and confirmed on repeat testing.  While 2019 novel coronavirus  (SARS-CoV-2) nucleic acids may be present in the submitted sample  additional confirmatory testing may be necessary for epidemiological  and / or clinical management purposes  to differentiate between  SARS-CoV-2 and other Sarbecovirus currently known to infect humans.  If  clinically indicated additional testing with an alternate test  methodology [phone]) is advised. The SARS-CoV-2 RNA is generally  detectable in upper and lower respiratory sp                          ecimens during the acute  phase of infection.  The expected result is Negative. Fact Sheet for Patients:  StrictlyIdeas.no Fact Sheet for Healthcare Providers: BankingDealers.co.za This test is not yet approved or cleared by the [place] FDA and has been authorized for detection and/or diagnosis of SARS-CoV-2 by FDA under an Emergency Use Authorization (EUA).  This EUA will remain in effect (meaning this test can be used) for the duration of the COVID-19 declaration under Section 564(b)(1) of the Act, 21 U.S.C. section 360bbb-3(b)(1), unless the authorization is terminated or revoked sooner. Performed at [facility], [place] [address] [place], [place] [address]   . LDH 01/06/2019 246* 98 - 192 U/L Final   Performed at [facility], [place] [address], [place], [place] [address]  . Troponin I 01/06/2019 <0.03  <0.03 ng/mL Final   Performed at [facility], [place] [address] [place], [place] [address]  . Hgb A1c MFr Bld 01/06/2019 8.4* 4.8 - 5.6 % Final   Comment: (NOTE) Pre diabetes:          5.7%-6.4% Diabetes:              >6.4% Glycemic control for   <7.0% adults with diabetes   . Mean Plasma Glucose 01/06/2019 194.38  mg/dL Final   Performed at [facility] [address] [place], [place] [address]  . Ferritin 01/06/2019 54  11 - 307 ng/mL Final   Performed at [facility], [place] [address] [place], [place] [address]  . Iron 01/06/2019 16* 28 - 170 ug/dL Final  . TIBC 01/06/2019 113* 250 - 450 ug/dL Final  . Saturation Ratios 01/06/2019 14  10.4 - 31.8 % Final  . UIBC 01/06/2019 97  ug/dL Final   Performed at [facility], [place] [address], [place], [place] [address]  . Sodium 01/06/2019 132* 135 -  145 mmol/L Final  . Potassium 01/06/2019 4.4  3.5 - 5.1 mmol/L Final   DELTA CHECK NOTED  . Chloride 01/06/2019 107  98 - 111 mmol/L Final  . CO2 01/06/2019 18* 22 - 32 mmol/L Final  . Glucose, Bld 01/06/2019 37* 70 - 99 mg/dL Final   Comment: CRITICAL RESULT CALLED TO, READ BACK BY AND VERIFIED WITH: Jearld Pies RN AT 1222 01/06/2019 BY [place]   . BUN 01/06/2019 22  8 - 23 mg/dL Final  . Creatinine, Ser 01/06/2019 1.71* 0.44 - 1.00 mg/dL Final  . Calcium 01/06/2019 7.8* 8.9 - 10.3 mg/dL Final  . GFR calc non Af Amer 01/06/2019 29* >60 mL/min Final  . GFR calc Af Amer 01/06/2019 34* >60 mL/min Final  . Anion gap 01/06/2019 7  5 - 15 Final   Performed at [facility], [place] [address] [place], [place] [address]  . Glucose-Capillary 01/06/2019 166* 70 - 99 mg/dL Final  . Comment 1 01/06/2019 Notify RN   Final  . Comment 2 01/06/2019 Document in Chart   Final  . Glucose-Capillary 01/06/2019 22* 70 - 99 mg/dL Final  . Comment 1 01/06/2019 Notify RN   Final  . Comment 2 01/06/2019 Document in Chart   Final  . Glucose-Capillary 01/06/2019 27* 70 - 99 mg/dL Final  . Comment 1 01/06/2019 Repeat Test   Final  . Glucose-Capillary 01/06/2019 12* 70 - 99 mg/dL Final  . Comment 1 01/06/2019 Repeat Test   Final  . Glucose-Capillary 01/06/2019 22* 70 - 99 mg/dL Final  . Comment 1 01/06/2019 Notify RN   Final  . Comment 2 01/06/2019 Document  in Chart   Final  . Glucose-Capillary 01/06/2019 34* 70 - 99 mg/dL Final  . Comment 1 01/06/2019 Repeat Test   Final  . Glucose-Capillary 01/06/2019 113* 70 - 99 mg/dL Final  . Comment 1 01/06/2019 Notify RN   Final  . Comment 2 01/06/2019 Document in Chart   Final  . Glucose-Capillary 01/06/2019 210* 70 - 99 mg/dL Final  . Comment 1 01/06/2019 Notify RN   Final  . Comment 2 01/06/2019 Document in Chart   Final    (this displays the last labs from the last 3 days)  No results found for: TOTALPROTELP, ALBUMINELP, A1GS, A2GS, BETS, BETA2SER, GAMS,  MSPIKE, SPEI (this displays SPEP labs)  No results found for: KPAFRELGTCHN, LAMBDASER, KAPLAMBRATIO (kappa/lambda light chains)  No results found for: HGBA, HGBA2QUANT, HGBFQUANT, HGBSQUAN (Hemoglobinopathy evaluation)   Lab Results  Component Value Date   LDH 246 (H) 01/06/2019    Lab Results  Component Value Date   IRON 16 (L) 01/06/2019   TIBC 113 (L) 01/06/2019   IRONPCTSAT 14 01/06/2019   (Iron and TIBC)  Lab Results  Component Value Date   FERRITIN 54 01/06/2019    Urinalysis    Component Value Date/Time   COLORURINE STRAW (A) 11/12/2018 1950   APPEARANCEUR CLEAR 11/12/2018 1950   LABSPEC 1.006 11/12/2018 1950   PHURINE 6.0 11/12/2018 1950   GLUCOSEU NEGATIVE 11/12/2018 1950   HGBUR NEGATIVE 11/12/2018 1950   BILIRUBINUR NEGATIVE 11/12/2018 1950   KETONESUR NEGATIVE 11/12/2018 1950   PROTEINUR NEGATIVE 11/12/2018 1950   UROBILINOGEN 0.2 05/09/2015 2109   NITRITE NEGATIVE 11/12/2018 1950   LEUKOCYTESUR NEGATIVE 11/12/2018 1950     STUDIES: Ct Abdomen Pelvis Wo Contrast  Result Date: 01/06/2019 CLINICAL DATA:  Occult primary, adenocarcinoma or carcinoma not otherwise specified. EXAM: CT CHEST, ABDOMEN AND PELVIS WITHOUT CONTRAST TECHNIQUE: Multidetector CT imaging of the chest, abdomen and pelvis was performed following the standard protocol without IV contrast. COMPARISON:  MRI of September 23, 2018.  CT scan of November 30, 2017. FINDINGS: CT CHEST FINDINGS Cardiovascular: Atherosclerosis of thoracic aorta is noted without aneurysm formation. Normal cardiac size. Coronary artery calcifications are noted. No pericardial effusion. Mediastinum/Nodes: Small sliding-type hiatal hernia is noted. Thyroid gland is unremarkable. No adenopathy is noted. Lungs/Pleura: No pneumothorax or pleural effusion is noted. 1.9 cm lobulated mass is noted in superior segment of left lower lobe best seen on image number 47 of series 4. This is concerning for primary malignancy or possibly  metastatic disease. 6 mm nodule is seen in right upper lobe best seen on image number 79 of series 4. Right lower lobe opacity is noted concerning for atelectasis or possibly pneumonia. Opacity is noted posteriorly in the right upper lobe concerning for possible pneumonia. Musculoskeletal: No chest wall mass or suspicious bone lesions identified. CT ABDOMEN PELVIS FINDINGS Hepatobiliary: No gallstones or biliary dilatation is noted. Evaluation of the patent parenchyma is limited due to the lack of intravenous contrast. However large ill-defined low density measuring 6.9 x 4.4 cm is noted in the right hepatic lobe which is significantly enlarged compared to prior exam and concerning for metastatic disease or malignancy. Pancreas: Diffuse atrophy is again noted with multiple parenchymal calcifications. Pancreatic ductal dilatation is again noted. Spleen: Normal in size without focal abnormality. Adrenals/Urinary Tract: Adrenal glands appear normal. Bilateral renal cysts are noted. Mild perinephric stranding is noted. No hydronephrosis or renal obstruction is noted. No renal or ureteral calculi are noted. Urinary bladder is unremarkable. Stomach/Bowel: Status post right  hemicolectomy. Sigmoid diverticulosis is noted without inflammation. 3.0 x 2.5 cm rounded soft tissue density is seen between the pancreatic tail and stomach concerning for possible neoplasm or malignancy, best seen on image number 133 of series 5. There is no evidence of bowel obstruction or inflammation. Vascular/Lymphatic: Atherosclerosis of thoracic aorta is noted without aneurysm formation. Enlarged collateral veins are again noted in left retroperitoneal region. These appear to drain into dilated left ovarian vein. Reproductive: Status post hysterectomy. No adnexal masses. Other: No abdominal wall hernia or abnormality. No abdominopelvic ascites. Musculoskeletal: No acute or significant osseous findings. IMPRESSION: 1.9 cm nodule seen in superior  segment of left lower lobe concerning for primary malignancy or metastatic disease. Also noted is 6 mm nodule in right upper lobe concerning for metastatic disease. Peripheral opacity is noted posteriorly in the right upper lobe concerning for pneumonia, although underlying neoplasm or malignancy cannot be excluded. Continued radiographic follow-up is recommended. Ill-defined low density measuring 6.9 x 4.4 cm is noted in right hepatic lobe which is significantly enlarged compared to prior exam, concerning for metastatic disease or malignancy. Evaluation of the liver is limited due the lack of intravenous contrast. There is interval development of possible 3 cm rounded soft tissue density between the pancreatic tail and stomach concerning for possible neoplasm or malignancy. Further evaluation with MRI is recommended. Diffuse pancreatic atrophy is again noted with multiple parenchymal calcifications. Aortic Atherosclerosis (ICD10-I70.0). Electronically Signed   By: Marijo Conception M.D.   On: 01/06/2019 08:58   Dg Chest 2 View  Result Date: 01/06/2019 CLINICAL DATA:  73 year old female with dizziness and chest pain. EXAM: CHEST - 2 VIEW COMPARISON:  Chest radiograph dated 11/12/2018 FINDINGS: There is emphysematous changes of the lungs. No focal consolidation, pleural effusion, or pneumothorax. The cardiac silhouette is within normal limits. No acute osseous pathology. Osteopenia. IMPRESSION: No active cardiopulmonary disease. Electronically Signed   By: Anner Crete M.D.   On: 01/06/2019 03:27   Ct Head Wo Contrast  Result Date: 01/06/2019 CLINICAL DATA:  73 year old female with dizziness. EXAM: CT HEAD WITHOUT CONTRAST TECHNIQUE: Contiguous axial images were obtained from the base of the skull through the vertex without intravenous contrast. COMPARISON:  Head CT dated 11/12/2018 FINDINGS: Brain: There is mild age-related atrophy and chronic microvascular ischemic changes. There is no acute intracranial  hemorrhage. No mass effect or midline shift. No extra-axial fluid collection. Vascular: No hyperdense vessel or unexpected calcification. Skull: No acute calvarial pathology. Diffuse tiny calvarial lucencies with a ?salt and pepper appearance? which may be related to underlying metabolic disease or hyperparathyroidism. Sinuses/Orbits: Chronic mucoperiosteal thickening and remodeling of the right maxillary sinus. No air-fluid level. The remainder of the visualized paranasal sinuses and mastoid air cells are clear. Other: None IMPRESSION: 1. No acute intracranial hemorrhage. 2. Age-related atrophy and chronic microvascular ischemic changes. Electronically Signed   By: Anner Crete M.D.   On: 01/06/2019 03:49   Ct Chest Wo Contrast  Result Date: 01/06/2019 CLINICAL DATA:  Occult primary, adenocarcinoma or carcinoma not otherwise specified. EXAM: CT CHEST, ABDOMEN AND PELVIS WITHOUT CONTRAST TECHNIQUE: Multidetector CT imaging of the chest, abdomen and pelvis was performed following the standard protocol without IV contrast. COMPARISON:  MRI of September 23, 2018.  CT scan of November 30, 2017. FINDINGS: CT CHEST FINDINGS Cardiovascular: Atherosclerosis of thoracic aorta is noted without aneurysm formation. Normal cardiac size. Coronary artery calcifications are noted. No pericardial effusion. Mediastinum/Nodes: Small sliding-type hiatal hernia is noted. Thyroid gland is unremarkable. No adenopathy is  noted. Lungs/Pleura: No pneumothorax or pleural effusion is noted. 1.9 cm lobulated mass is noted in superior segment of left lower lobe best seen on image number 47 of series 4. This is concerning for primary malignancy or possibly metastatic disease. 6 mm nodule is seen in right upper lobe best seen on image number 79 of series 4. Right lower lobe opacity is noted concerning for atelectasis or possibly pneumonia. Opacity is noted posteriorly in the right upper lobe concerning for possible pneumonia. Musculoskeletal: No  chest wall mass or suspicious bone lesions identified. CT ABDOMEN PELVIS FINDINGS Hepatobiliary: No gallstones or biliary dilatation is noted. Evaluation of the patent parenchyma is limited due to the lack of intravenous contrast. However large ill-defined low density measuring 6.9 x 4.4 cm is noted in the right hepatic lobe which is significantly enlarged compared to prior exam and concerning for metastatic disease or malignancy. Pancreas: Diffuse atrophy is again noted with multiple parenchymal calcifications. Pancreatic ductal dilatation is again noted. Spleen: Normal in size without focal abnormality. Adrenals/Urinary Tract: Adrenal glands appear normal. Bilateral renal cysts are noted. Mild perinephric stranding is noted. No hydronephrosis or renal obstruction is noted. No renal or ureteral calculi are noted. Urinary bladder is unremarkable. Stomach/Bowel: Status post right hemicolectomy. Sigmoid diverticulosis is noted without inflammation. 3.0 x 2.5 cm rounded soft tissue density is seen between the pancreatic tail and stomach concerning for possible neoplasm or malignancy, best seen on image number 133 of series 5. There is no evidence of bowel obstruction or inflammation. Vascular/Lymphatic: Atherosclerosis of thoracic aorta is noted without aneurysm formation. Enlarged collateral veins are again noted in left retroperitoneal region. These appear to drain into dilated left ovarian vein. Reproductive: Status post hysterectomy. No adnexal masses. Other: No abdominal wall hernia or abnormality. No abdominopelvic ascites. Musculoskeletal: No acute or significant osseous findings. IMPRESSION: 1.9 cm nodule seen in superior segment of left lower lobe concerning for primary malignancy or metastatic disease. Also noted is 6 mm nodule in right upper lobe concerning for metastatic disease. Peripheral opacity is noted posteriorly in the right upper lobe concerning for pneumonia, although underlying neoplasm or  malignancy cannot be excluded. Continued radiographic follow-up is recommended. Ill-defined low density measuring 6.9 x 4.4 cm is noted in right hepatic lobe which is significantly enlarged compared to prior exam, concerning for metastatic disease or malignancy. Evaluation of the liver is limited due the lack of intravenous contrast. There is interval development of possible 3 cm rounded soft tissue density between the pancreatic tail and stomach concerning for possible neoplasm or malignancy. Further evaluation with MRI is recommended. Diffuse pancreatic atrophy is again noted with multiple parenchymal calcifications. Aortic Atherosclerosis (ICD10-I70.0). Electronically Signed   By: Marijo Conception M.D.   On: 01/06/2019 08:58    ELIGIBLE FOR AVAILABLE RESEARCH PROTOCOL: no  ASSESSMENT: 73 y.o. 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 I suggested treatment is available, though it can be challenging (chemotherapy) and that some patients may prefer best supportive case/ comfort care with Hospice support. She had no comments or questions.   I called her aunt and brother, who are listed in her chart, but was  not able to locate either of them.  In general, w/o chemotherapy survival is expected to be <6 months and patient qualifies for Hospice support.  I cannot tell if the patient is currently at hr baseline or if she is somewhat lethargic from medication. I have placed a palliative care consult so they can work with the patient and her family and determine her understanding of her situation and goals of care.  If the patient/family decides on chemotherapy,  which has many side effects but may prolong her life to some extent, understanding there is no cure, I will refer her to my partners specializing in GI malignancies.  Given the patient's comorbidities and functional status, I recommend best supportive care, DNR, and Hospice referral.. If the patient/family agrees to this optin I will be glad to continue to participate in her care.   Please let me know if I can be of further help at this point.      Chauncey Cruel, MD   01/06/2019 4:38 PM Medical Oncology and Hematology [facility] [address] [place], [place] [address] Tel. [phone]    Fax. [phone]

## 2019-01-06 NOTE — ED Notes (Signed)
Nurse drawing labs. 

## 2019-01-06 NOTE — ED Triage Notes (Signed)
Pt coming from home by ems. Pt started to have dizziness since 9pm last night and when pt arrived stop having dizziness. Pt currently having right sided chest pain since about 45 minutes ago when ems moved her right arm.

## 2019-01-06 NOTE — Progress Notes (Signed)
Lab called RN with critical blood sugar of 37. Checked with glucometer BS @ 22. Pt able to follow commands. Dr Wyline Copas notified. 1 oral glucose given. D5 @75cc /hr also administered. Rechecked sugar 27. 1 amp d50 given. Will recheck sugar in 10 mins. Jerald Kief, RN

## 2019-01-06 NOTE — Patient Care Conference (Signed)
Called and updated patient's brother, Simona Huh, at number listed and answered all questions. Earlier tried calling brother Mortimer Fries, however no answer.

## 2019-01-07 ENCOUNTER — Encounter (HOSPITAL_COMMUNITY)
Admission: EM | Disposition: A | Discharge status: Skilled Nursing Facility | Payer: Self-pay | Source: Home / Self Care | Attending: Internal Medicine

## 2019-01-07 DIAGNOSIS — C787 Secondary malignant neoplasm of liver and intrahepatic bile duct: Secondary | ICD-10-CM | POA: Diagnosis present

## 2019-01-07 DIAGNOSIS — I5032 Chronic diastolic (congestive) heart failure: Secondary | ICD-10-CM | POA: Diagnosis present

## 2019-01-07 DIAGNOSIS — Z881 Allergy status to other antibiotic agents status: Secondary | ICD-10-CM | POA: Diagnosis not present

## 2019-01-07 DIAGNOSIS — I2510 Atherosclerotic heart disease of native coronary artery without angina pectoris: Secondary | ICD-10-CM | POA: Diagnosis present

## 2019-01-07 DIAGNOSIS — R072 Precordial pain: Secondary | ICD-10-CM | POA: Diagnosis present

## 2019-01-07 DIAGNOSIS — Z888 Allergy status to other drugs, medicaments and biological substances status: Secondary | ICD-10-CM | POA: Diagnosis not present

## 2019-01-07 DIAGNOSIS — K210 Gastro-esophageal reflux disease with esophagitis: Secondary | ICD-10-CM | POA: Diagnosis present

## 2019-01-07 DIAGNOSIS — R079 Chest pain, unspecified: Secondary | ICD-10-CM | POA: Diagnosis not present

## 2019-01-07 DIAGNOSIS — Z66 Do not resuscitate: Secondary | ICD-10-CM

## 2019-01-07 DIAGNOSIS — Z800 Family history of malignant neoplasm of digestive organs: Secondary | ICD-10-CM | POA: Diagnosis not present

## 2019-01-07 DIAGNOSIS — K7030 Alcoholic cirrhosis of liver without ascites: Secondary | ICD-10-CM | POA: Diagnosis present

## 2019-01-07 DIAGNOSIS — R160 Hepatomegaly, not elsewhere classified: Secondary | ICD-10-CM | POA: Diagnosis not present

## 2019-01-07 DIAGNOSIS — E875 Hyperkalemia: Secondary | ICD-10-CM | POA: Diagnosis not present

## 2019-01-07 DIAGNOSIS — Z880 Allergy status to penicillin: Secondary | ICD-10-CM | POA: Diagnosis not present

## 2019-01-07 DIAGNOSIS — C7889 Secondary malignant neoplasm of other digestive organs: Secondary | ICD-10-CM | POA: Diagnosis present

## 2019-01-07 DIAGNOSIS — G92 Toxic encephalopathy: Secondary | ICD-10-CM | POA: Diagnosis present

## 2019-01-07 DIAGNOSIS — H409 Unspecified glaucoma: Secondary | ICD-10-CM | POA: Diagnosis present

## 2019-01-07 DIAGNOSIS — E1122 Type 2 diabetes mellitus with diabetic chronic kidney disease: Secondary | ICD-10-CM | POA: Diagnosis present

## 2019-01-07 DIAGNOSIS — J449 Chronic obstructive pulmonary disease, unspecified: Secondary | ICD-10-CM | POA: Diagnosis present

## 2019-01-07 DIAGNOSIS — N189 Chronic kidney disease, unspecified: Secondary | ICD-10-CM | POA: Diagnosis not present

## 2019-01-07 DIAGNOSIS — Z515 Encounter for palliative care: Secondary | ICD-10-CM

## 2019-01-07 DIAGNOSIS — Z7189 Other specified counseling: Secondary | ICD-10-CM | POA: Diagnosis not present

## 2019-01-07 DIAGNOSIS — G8929 Other chronic pain: Secondary | ICD-10-CM | POA: Diagnosis present

## 2019-01-07 DIAGNOSIS — M109 Gout, unspecified: Secondary | ICD-10-CM | POA: Diagnosis present

## 2019-01-07 DIAGNOSIS — Z794 Long term (current) use of insulin: Secondary | ICD-10-CM | POA: Diagnosis not present

## 2019-01-07 DIAGNOSIS — E11649 Type 2 diabetes mellitus with hypoglycemia without coma: Secondary | ICD-10-CM | POA: Diagnosis present

## 2019-01-07 DIAGNOSIS — N183 Chronic kidney disease, stage 3 (moderate): Secondary | ICD-10-CM | POA: Diagnosis present

## 2019-01-07 DIAGNOSIS — C7800 Secondary malignant neoplasm of unspecified lung: Secondary | ICD-10-CM | POA: Diagnosis present

## 2019-01-07 DIAGNOSIS — C799 Secondary malignant neoplasm of unspecified site: Secondary | ICD-10-CM | POA: Diagnosis present

## 2019-01-07 DIAGNOSIS — I130 Hypertensive heart and chronic kidney disease with heart failure and stage 1 through stage 4 chronic kidney disease, or unspecified chronic kidney disease: Secondary | ICD-10-CM | POA: Diagnosis present

## 2019-01-07 DIAGNOSIS — Z1159 Encounter for screening for other viral diseases: Secondary | ICD-10-CM | POA: Diagnosis not present

## 2019-01-07 DIAGNOSIS — F17210 Nicotine dependence, cigarettes, uncomplicated: Secondary | ICD-10-CM | POA: Diagnosis present

## 2019-01-07 LAB — GLUCOSE, CAPILLARY
Glucose-Capillary: 217 mg/dL — ABNORMAL HIGH (ref 70–99)
Glucose-Capillary: 306 mg/dL — ABNORMAL HIGH (ref 70–99)
Glucose-Capillary: 167 mg/dL — ABNORMAL HIGH (ref 70–99)
Glucose-Capillary: 107 mg/dL — ABNORMAL HIGH (ref 70–99)

## 2019-01-07 LAB — CEA: CEA: 21.3 ng/mL — ABNORMAL HIGH (ref 0.0–4.7)

## 2019-01-07 LAB — CANCER ANTIGEN 19-9: CA 19-9: 88 U/mL — ABNORMAL HIGH (ref 0–35)

## 2019-01-07 LAB — CA 125: Cancer Antigen (CA) 125: 112 U/mL — ABNORMAL HIGH (ref 0.0–38.1)

## 2019-01-07 SURGERY — RIGHT HEART CATH
Anesthesia: LOCAL

## 2019-01-07 MED ORDER — ADULT MULTIVITAMIN W/MINERALS CH
1.0000 | ORAL_TABLET | Freq: Every day | ORAL | Status: DC
  Administered 2019-01-07 – 2019-01-08 (×2): 1 via ORAL
  Filled 2019-01-07 (×2): qty 1

## 2019-01-07 MED ORDER — ENSURE ENLIVE PO LIQD
237.0000 mL | Freq: Three times a day (TID) | ORAL | Status: DC
  Administered 2019-01-07 – 2019-01-10 (×7): 237 mL via ORAL

## 2019-01-07 MED ORDER — SODIUM CHLORIDE 0.9 % IV SOLN: INTRAVENOUS | Status: DC

## 2019-01-07 NOTE — Evaluation (Signed)
Physical Therapy Evaluation Patient Details Name: [name] MRN: [id number] DOB: [birth year] Today's Date: 01/07/2019   History of Present Illness  73 year old woman with medical problems including cirrhosis, former alcohol abuse, insulin-dependent diabetes, chronic kidney disease, anemia, COPD on 2 L nasal cannula O2 nightly, and a diagnosis of adenocarcinoma made on a liver biopsy in January 2020, who presents with sweating and right-sided chest pain.  Clinical Impression  Pt admitted with above diagnosis. Pt currently with functional limitations due to the deficits listed below (see PT Problem List). Pt ambulated 33' with RW, mild loss of balance x 1, pt was able to self correct. Supervision for mobility recommended, either at [facility] or home if family can provide 24* supervision.  Pt will benefit from skilled PT to increase their independence and safety with mobility to allow discharge to the venue listed below.        Follow Up Recommendations Supervision/Assistance - 24 hour;Supervision for mobility/OOB;Home health PT; vs. SNF    Equipment Recommendations  None recommended by PT    Recommendations for Other Services       Precautions / Restrictions Precautions Precautions: Fall Precaution Comments: pt denies falls in past 1 year Restrictions Weight Bearing Restrictions: No      Mobility  Bed Mobility               General bed mobility comments: up on edge of bed  Transfers Overall transfer level: Needs assistance Equipment used: Rolling walker (2 wheeled) Transfers: Sit to/from Stand Sit to Stand: Min assist         General transfer comment: min assist to rise/steady  Ambulation/Gait Ambulation/Gait assistance: Min guard Gait Distance (Feet): 60 Feet Assistive device: Rolling walker (2 wheeled) Gait Pattern/deviations: Step-through pattern;Decreased stride length Gait velocity: decr   General Gait Details: steady, mild loss of balance x 1 during a  turn -pt was able to self correct, distance limited by fatigue  Stairs            Wheelchair Mobility    Modified Rankin (Stroke Patients Only)       Balance Overall balance assessment: Needs assistance   Sitting balance-Leahy Scale: Good       Standing balance-Leahy Scale: Fair                               Pertinent Vitals/Pain Pain Assessment: No/denies pain    Home Living Family/patient expects to be discharged to:: Private residence Living Arrangements: Alone Available Help at Discharge: Personal care attendant Type of Home: Apartment Home Access: Level entry     Home Layout: One level Home Equipment: Walker - 2 wheels;Walker - 4 wheels;Bedside commode;Cane - single point;Cane - quad;Tub bench Additional Comments: caregiver 2 hrs/ day     Prior Function Level of Independence: Needs assistance   Gait / Transfers Assistance Needed: ambulates with cane and rollator outdoors  ADL's / Homemaking Assistance Needed: Aide assists with bathing/dressing and IADLs  Comments: Above information taken from prior admission. Pt does report that she has a cane and rollator that she tries not to use. States she is able to perform ADL's without assistance.      Hand Dominance        Extremity/Trunk Assessment   Upper Extremity Assessment Upper Extremity Assessment: Overall WFL for tasks assessed    Lower Extremity Assessment Lower Extremity Assessment: Overall WFL for tasks assessed    Cervical / Trunk Assessment Cervical /  Trunk Assessment: Normal  Communication   Communication: No difficulties  Cognition Arousal/Alertness: Awake/alert Behavior During Therapy: WFL for tasks assessed/performed Overall Cognitive Status: No family/caregiver present to determine baseline cognitive functioning(pt oriented to self, location, situation. Seems to have some short term memory deficits)                                        General  Comments      Exercises     Assessment/Plan    PT Assessment Patient needs continued PT services  PT Problem List Decreased activity tolerance;Decreased mobility;Decreased balance       PT Treatment Interventions Gait training;Therapeutic exercise;Therapeutic activities;Functional mobility training;Patient/family education    PT Goals (Current goals can be found in the Care Plan section)  Acute Rehab PT Goals Patient Stated Goal: to get stronger PT Goal Formulation: With patient Time For Goal Achievement: 01/21/19 Potential to Achieve Goals: Fair    Frequency Min 3X/week   Barriers to discharge Decreased caregiver support      Co-evaluation               AM-PAC PT "6 Clicks" Mobility  Outcome Measure Help needed turning from your back to your side while in a flat bed without using bedrails?: A Little Help needed moving from lying on your back to sitting on the side of a flat bed without using bedrails?: A Little Help needed moving to and from a bed to a chair (including a wheelchair)?: A Little Help needed standing up from a chair using your arms (e.g., wheelchair or bedside chair)?: A Little Help needed to walk in hospital room?: A Little Help needed climbing 3-5 steps with a railing? : A Little 6 Click Score: 18    End of Session Equipment Utilized During Treatment: Gait belt Activity Tolerance: Patient tolerated treatment well Patient left: in chair;with call bell/phone within reach;with nursing/sitter in room Nurse Communication: Mobility status(bed soiled and needs to be changed) PT Visit Diagnosis: Unsteadiness on feet (R26.81);Difficulty in walking, not elsewhere classified (R26.2)    Time: 2774-1287 PT Time Calculation (min) (ACUTE ONLY): 32 min   Charges:   PT Evaluation $PT Eval Low Complexity: 1 Low PT Treatments $Gait Training: 8-22 mins       Blondell Reveal Kistler PT 01/07/2019  Acute Rehabilitation Services Pager  [phone] Office [phone]

## 2019-01-07 NOTE — Progress Notes (Signed)
PT Cancellation Note  Patient Details Name: VIOLETTA LAVALLE MRN: [id number] DOB: [birth year]   Cancelled Treatment:    Reason Eval/Treat Not Completed: Other (comment)(pt declined to attempt getting out of bed because she stated she's too cold and that she's waiting to be cleaned up. Will check back later today. )   Philomena Doheny PT 01/07/2019  Acute Rehabilitation Services Pager [phone] Office [phone]

## 2019-01-07 NOTE — Progress Notes (Signed)
PROGRESS NOTE    [name]  RKY:[id number] DOB: 1945-10-31 DOA: 01/06/2019 PCP: Nolene Ebbs, MD    Brief Narrative:  73 year old woman with medical problems including cirrhosis, former alcohol abuse, insulin-dependent diabetes, chronic kidney disease, anemia, COPD on 2 L nasal cannula O2 nightly, and a diagnosis of adenocarcinoma made on a liver biopsy in January 2020, who presents with sweating and right-sided chest pain.  Patient reports at baseline she lives alone, is retired from working for the [employer].  Reports she has not drank alcohol in over a year, ambulates with the use of a walker, has an aide that comes 2 hours on most days that assists her with her IADLs.  The evening prior to admission she reports that she had an episode of diaphoresis and trembling, almost passed out.  In addition she had right-sided chest pain described as sharp, lasted for less than 1 hour.  She is never had this pain before.  She called a friend of hers who came to her home, blood sugar check at that time was 292.  Other symptoms she reports is intermittent bleeding per rectum, although none today.  She reports having oral pain in her gums that she takes oxycodone for intermittently.  She identifies a primary care physician, she reports she has not seen in months.  The patient reports she does not know of her cancer diagnosis.  She has 4 children who are living, her father is also alive age 96 who lives locally.  She denies any pain at this time, no nausea, vomiting, no fevers.  ED Course: In the emergency department vital signs remarkable for normal heart rate, respiratory rate maximum 21, systolic blood pressure ranging from 100 to 139 mmHg.  Lab results remarkable for undetectable troponin.  Ammonia within normal limits.  Albumin of 1.8.  Potassium of 5.8.  Creatinine of 1.97.  Hemoglobin of 8.3.  White count of 12.9, neutrophil predominance.  CT scan of the head without contrast no acute  findings.  Chest x-ray read as no active cardiopulmonary disease.  EKG without ST segment elevations.  Hospital medicine was consulted for further management.  Assessment & Plan:   Active Problems:   Chest pain   Hyperkalemia   #Transient right sided CP -Noted to be of single occurrence, lasted < 60 minutes, associated with diaphoresis and pre-syncope.  EKG without ST segment changes and initial troponin WNL.   -Troponin neg    #Metastatic Adenocarcinoma involving lungs, liver, pancreatic tail -Underwent liver biopsy on 09/25/2018 that revealed adenocarcinoma, favored upper GI / pancreaticobiliary or primary cholangiocarcinoma.   -MRI on 09/23/2018 revealed multiple lesions, dominant mass in segment 8 measuring 7.9 x 5.4 x 5.8 cm.   -Personally reviewed CT abd/pelvis, chest with findings of multiple metastatic foci present. Most notably, liver lesion significantly enlarged over prior imaging and 3cm lesion at tail of pancreas -Appreciate input by Oncology. Difficult situation. Incurable metastatic disease with expected prognosis of <29mos without treatment. Any treatment would be palliative. -Palliative Care consulted for assistance determining patient's goals of care/code status -On 5/10, had discussed with patient's brother, Simona Huh, who states he would be "point person" who could relay information to the rest of the family  #Cirrhosis:  -Presented with  PT INR of 1.3, albumin 1.8 -Seems stable at present  #HTN:  -BP appears to be controlled -continue home BB as tolerated  #Anemia:  -hb of 8.3 on admission -obtaining iron studies to further evaluate  #COPD:  -Pt noted to be  on 2L  O2 qhs and as needed bronchodilator -remains on minimal O2 support  #Chronic gingival pain: -continue home oxycodone prn for pain  #GERD:  -continue home PPI as tolerated  #Gout:  -No evidence of acute exacerbation, continue allopurinol 100 mg  #Hyperkalemia -Potassium on admission K of  5.8;  -Normalized  -Insulin dependent DM with symptomatic hypoglycemia -Pt reported taking 10 units of basal insulin daily, but cannot recall rapid acting insulin regimen. -On 5/10, pt noted to be profoundly hypoglycemic with glucose in the 30's, symptomatic -little improvement with oral glucose load. Required continuous D5 fluids with PRN D50 -Hold scheduled insulin -Glucose improved  #Peripheral arterial disease -continue home cilostazol as tolerated  #CKD:  -Baseline Cr noted to be around 2 -Cr improved to 8.93  #Toxic metabolic encephalopathy -Markedly confused this AM, unable to converse -Likely secondary to symptomatic hypoglycemia -Improved with correcting glucose  DVT prophylaxis: Heparin subq Code Status: Full Family Communication: Pt in room, family not at bedside, updated family over phone on 5/10 Disposition Plan: Uncertain at this time  Consultants:   Oncology  Palliative Care  Procedures:     Antimicrobials: Anti-infectives (From admission, onward)   None      Subjective: Asking for orange creamsicle    Objective: Vitals:   01/06/19 0934 01/06/19 1635 01/06/19 2033 01/07/19 0541  BP: (!) 146/81 (!) 171/71 (!) 157/86 (!) 114/49  Pulse: 72 (!) 59 64 72  Resp: 20 18 18 20   Temp: 97.8 F (36.6 C)  97.8 F (36.6 C) 98.9 F (37.2 C)  TempSrc: Oral  Oral   SpO2: 100%  100% 100%  Weight:    54.9 kg  Height:        Intake/Output Summary (Last 24 hours) at 01/07/2019 0820 Last data filed at 01/07/2019 0555 Gross per 24 hour  Intake 997.44 ml  Output 500 ml  Net 497.44 ml   Filed Weights   01/06/19 0651 01/07/19 0541  Weight: 53 kg 54.9 kg    Examination: General exam: Awake, laying in bed, in nad Respiratory system: Normal respiratory effort, no wheezing Cardiovascular system: regular rate, s1, s2 Gastrointestinal system: Soft, nondistended, positive BS Central nervous system: CN2-12 grossly intact, strength intact Extremities:  Perfused, no clubbing Skin: Normal skin turgor, no notable skin lesions seen Psychiatry: Mood normal // no visual hallucinations   Data Reviewed: I have personally reviewed following labs and imaging studies  CBC: Recent Labs  Lab 01/06/19 0248  WBC 12.9*  NEUTROABS 9.0*  HGB 8.3*  HCT 28.1*  MCV 82.2  PLT 810   Basic Metabolic Panel: Recent Labs  Lab 01/06/19 0248 01/06/19 1127  NA 135 132*  K 5.8* 4.4  CL 110 107  CO2 15* 18*  GLUCOSE 315* 37*  BUN 24* 22  CREATININE 1.97* 1.71*  CALCIUM 8.3* 7.8*   GFR: Estimated Creatinine Clearance: 22.5 mL/min (A) (by C-G formula based on SCr of 1.71 mg/dL (H)). Liver Function Tests: Recent Labs  Lab 01/06/19 0248  AST 26  ALT 17  ALKPHOS 262*  BILITOT 0.4  PROT 5.2*  ALBUMIN 1.8*   No results for input(s): LIPASE, AMYLASE in the last 168 hours. Recent Labs  Lab 01/06/19 0252  AMMONIA 38*   Coagulation Profile: Recent Labs  Lab 01/06/19 0248  INR 1.3*   Cardiac Enzymes: Recent Labs  Lab 01/06/19 0248 01/06/19 0534  TROPONINI <0.03 <0.03   BNP (last 3 results) No results for input(s): PROBNP in the last 8760 hours. HbA1C: Recent  Labs    01/06/19 0534  HGBA1C 8.4*   CBG: Recent Labs  Lab 01/06/19 1323 01/06/19 1402 01/06/19 1633 01/06/19 2114 01/07/19 0552  GLUCAP 34* 113* 210* 125* 217*   Lipid Profile: No results for input(s): CHOL, HDL, LDLCALC, TRIG, CHOLHDL, LDLDIRECT in the last 72 hours. Thyroid Function Tests: No results for input(s): TSH, T4TOTAL, FREET4, T3FREE, THYROIDAB in the last 72 hours. Anemia Panel: Recent Labs    01/06/19 0534  FERRITIN 54  TIBC 113*  IRON 16*   Sepsis Labs: No results for input(s): PROCALCITON, LATICACIDVEN in the last 168 hours.  Recent Results (from the past 240 hour(s))  SARS Coronavirus 2 (CEPHEID - Performed in [place] hospital lab), Hosp Order     Status: None   Collection Time: 01/06/19  4:39 AM  Result Value Ref Range Status   SARS  Coronavirus 2 NEGATIVE NEGATIVE Final    Comment: (NOTE) If result is NEGATIVE SARS-CoV-2 target nucleic acids are NOT DETECTED. The SARS-CoV-2 RNA is generally detectable in upper and lower  respiratory specimens during the acute phase of infection. The lowest  concentration of SARS-CoV-2 viral copies this assay can detect is 250  copies / mL. A negative result does not preclude SARS-CoV-2 infection  and should not be used as the sole basis for treatment or other  patient management decisions.  A negative result may occur with  improper specimen collection / handling, submission of specimen other  than nasopharyngeal swab, presence of viral mutation(s) within the  areas targeted by this assay, and inadequate number of viral copies  (<250 copies / mL). A negative result must be combined with clinical  observations, patient history, and epidemiological information. If result is POSITIVE SARS-CoV-2 target nucleic acids are DETECTED. The SARS-CoV-2 RNA is generally detectable in upper and lower  respiratory specimens dur ing the acute phase of infection.  Positive  results are indicative of active infection with SARS-CoV-2.  Clinical  correlation with patient history and other diagnostic information is  necessary to determine patient infection status.  Positive results do  not rule out bacterial infection or co-infection with other viruses. If result is PRESUMPTIVE POSTIVE SARS-CoV-2 nucleic acids MAY BE PRESENT.   A presumptive positive result was obtained on the submitted specimen  and confirmed on repeat testing.  While 2019 novel coronavirus  (SARS-CoV-2) nucleic acids may be present in the submitted sample  additional confirmatory testing may be necessary for epidemiological  and / or clinical management purposes  to differentiate between  SARS-CoV-2 and other Sarbecovirus currently known to infect humans.  If clinically indicated additional testing with an alternate test    methodology [phone]) is advised. The SARS-CoV-2 RNA is generally  detectable in upper and lower respiratory sp ecimens during the acute  phase of infection. The expected result is Negative. Fact Sheet for Patients:  StrictlyIdeas.no Fact Sheet for Healthcare Providers: BankingDealers.co.za This test is not yet approved or cleared by the [place] FDA and has been authorized for detection and/or diagnosis of SARS-CoV-2 by FDA under an Emergency Use Authorization (EUA).  This EUA will remain in effect (meaning this test can be used) for the duration of the COVID-19 declaration under Section 564(b)(1) of the Act, 21 U.S.C. section 360bbb-3(b)(1), unless the authorization is terminated or revoked sooner. Performed at [facility], [place] [address], [place], [place] [address]      Radiology Studies: Ct Abdomen Pelvis Wo Contrast  Result Date: 01/06/2019 CLINICAL DATA:  Occult primary, adenocarcinoma  or carcinoma not otherwise specified. EXAM: CT CHEST, ABDOMEN AND PELVIS WITHOUT CONTRAST TECHNIQUE: Multidetector CT imaging of the chest, abdomen and pelvis was performed following the standard protocol without IV contrast. COMPARISON:  MRI of September 23, 2018.  CT scan of November 30, 2017. FINDINGS: CT CHEST FINDINGS Cardiovascular: Atherosclerosis of thoracic aorta is noted without aneurysm formation. Normal cardiac size. Coronary artery calcifications are noted. No pericardial effusion. Mediastinum/Nodes: Small sliding-type hiatal hernia is noted. Thyroid gland is unremarkable. No adenopathy is noted. Lungs/Pleura: No pneumothorax or pleural effusion is noted. 1.9 cm lobulated mass is noted in superior segment of left lower lobe best seen on image number 47 of series 4. This is concerning for primary malignancy or possibly metastatic disease. 6 mm nodule is seen in right upper lobe best seen on image number 79 of series 4. Right lower lobe  opacity is noted concerning for atelectasis or possibly pneumonia. Opacity is noted posteriorly in the right upper lobe concerning for possible pneumonia. Musculoskeletal: No chest wall mass or suspicious bone lesions identified. CT ABDOMEN PELVIS FINDINGS Hepatobiliary: No gallstones or biliary dilatation is noted. Evaluation of the patent parenchyma is limited due to the lack of intravenous contrast. However large ill-defined low density measuring 6.9 x 4.4 cm is noted in the right hepatic lobe which is significantly enlarged compared to prior exam and concerning for metastatic disease or malignancy. Pancreas: Diffuse atrophy is again noted with multiple parenchymal calcifications. Pancreatic ductal dilatation is again noted. Spleen: Normal in size without focal abnormality. Adrenals/Urinary Tract: Adrenal glands appear normal. Bilateral renal cysts are noted. Mild perinephric stranding is noted. No hydronephrosis or renal obstruction is noted. No renal or ureteral calculi are noted. Urinary bladder is unremarkable. Stomach/Bowel: Status post right hemicolectomy. Sigmoid diverticulosis is noted without inflammation. 3.0 x 2.5 cm rounded soft tissue density is seen between the pancreatic tail and stomach concerning for possible neoplasm or malignancy, best seen on image number 133 of series 5. There is no evidence of bowel obstruction or inflammation. Vascular/Lymphatic: Atherosclerosis of thoracic aorta is noted without aneurysm formation. Enlarged collateral veins are again noted in left retroperitoneal region. These appear to drain into dilated left ovarian vein. Reproductive: Status post hysterectomy. No adnexal masses. Other: No abdominal wall hernia or abnormality. No abdominopelvic ascites. Musculoskeletal: No acute or significant osseous findings. IMPRESSION: 1.9 cm nodule seen in superior segment of left lower lobe concerning for primary malignancy or metastatic disease. Also noted is 6 mm nodule in right  upper lobe concerning for metastatic disease. Peripheral opacity is noted posteriorly in the right upper lobe concerning for pneumonia, although underlying neoplasm or malignancy cannot be excluded. Continued radiographic follow-up is recommended. Ill-defined low density measuring 6.9 x 4.4 cm is noted in right hepatic lobe which is significantly enlarged compared to prior exam, concerning for metastatic disease or malignancy. Evaluation of the liver is limited due the lack of intravenous contrast. There is interval development of possible 3 cm rounded soft tissue density between the pancreatic tail and stomach concerning for possible neoplasm or malignancy. Further evaluation with MRI is recommended. Diffuse pancreatic atrophy is again noted with multiple parenchymal calcifications. Aortic Atherosclerosis (ICD10-I70.0). Electronically Signed   By: Marijo Conception M.D.   On: 01/06/2019 08:58   Dg Chest 2 View  Result Date: 01/06/2019 CLINICAL DATA:  73 year old female with dizziness and chest pain. EXAM: CHEST - 2 VIEW COMPARISON:  Chest radiograph dated 11/12/2018 FINDINGS: There is emphysematous changes of the lungs. No focal consolidation, pleural  effusion, or pneumothorax. The cardiac silhouette is within normal limits. No acute osseous pathology. Osteopenia. IMPRESSION: No active cardiopulmonary disease. Electronically Signed   By: Anner Crete M.D.   On: 01/06/2019 03:27   Ct Head Wo Contrast  Result Date: 01/06/2019 CLINICAL DATA:  73 year old female with dizziness. EXAM: CT HEAD WITHOUT CONTRAST TECHNIQUE: Contiguous axial images were obtained from the base of the skull through the vertex without intravenous contrast. COMPARISON:  Head CT dated 11/12/2018 FINDINGS: Brain: There is mild age-related atrophy and chronic microvascular ischemic changes. There is no acute intracranial hemorrhage. No mass effect or midline shift. No extra-axial fluid collection. Vascular: No hyperdense vessel or  unexpected calcification. Skull: No acute calvarial pathology. Diffuse tiny calvarial lucencies with a ?salt and pepper appearance? which may be related to underlying metabolic disease or hyperparathyroidism. Sinuses/Orbits: Chronic mucoperiosteal thickening and remodeling of the right maxillary sinus. No air-fluid level. The remainder of the visualized paranasal sinuses and mastoid air cells are clear. Other: None IMPRESSION: 1. No acute intracranial hemorrhage. 2. Age-related atrophy and chronic microvascular ischemic changes. Electronically Signed   By: Anner Crete M.D.   On: 01/06/2019 03:49   Ct Chest Wo Contrast  Result Date: 01/06/2019 CLINICAL DATA:  Occult primary, adenocarcinoma or carcinoma not otherwise specified. EXAM: CT CHEST, ABDOMEN AND PELVIS WITHOUT CONTRAST TECHNIQUE: Multidetector CT imaging of the chest, abdomen and pelvis was performed following the standard protocol without IV contrast. COMPARISON:  MRI of September 23, 2018.  CT scan of November 30, 2017. FINDINGS: CT CHEST FINDINGS Cardiovascular: Atherosclerosis of thoracic aorta is noted without aneurysm formation. Normal cardiac size. Coronary artery calcifications are noted. No pericardial effusion. Mediastinum/Nodes: Small sliding-type hiatal hernia is noted. Thyroid gland is unremarkable. No adenopathy is noted. Lungs/Pleura: No pneumothorax or pleural effusion is noted. 1.9 cm lobulated mass is noted in superior segment of left lower lobe best seen on image number 47 of series 4. This is concerning for primary malignancy or possibly metastatic disease. 6 mm nodule is seen in right upper lobe best seen on image number 79 of series 4. Right lower lobe opacity is noted concerning for atelectasis or possibly pneumonia. Opacity is noted posteriorly in the right upper lobe concerning for possible pneumonia. Musculoskeletal: No chest wall mass or suspicious bone lesions identified. CT ABDOMEN PELVIS FINDINGS Hepatobiliary: No gallstones  or biliary dilatation is noted. Evaluation of the patent parenchyma is limited due to the lack of intravenous contrast. However large ill-defined low density measuring 6.9 x 4.4 cm is noted in the right hepatic lobe which is significantly enlarged compared to prior exam and concerning for metastatic disease or malignancy. Pancreas: Diffuse atrophy is again noted with multiple parenchymal calcifications. Pancreatic ductal dilatation is again noted. Spleen: Normal in size without focal abnormality. Adrenals/Urinary Tract: Adrenal glands appear normal. Bilateral renal cysts are noted. Mild perinephric stranding is noted. No hydronephrosis or renal obstruction is noted. No renal or ureteral calculi are noted. Urinary bladder is unremarkable. Stomach/Bowel: Status post right hemicolectomy. Sigmoid diverticulosis is noted without inflammation. 3.0 x 2.5 cm rounded soft tissue density is seen between the pancreatic tail and stomach concerning for possible neoplasm or malignancy, best seen on image number 133 of series 5. There is no evidence of bowel obstruction or inflammation. Vascular/Lymphatic: Atherosclerosis of thoracic aorta is noted without aneurysm formation. Enlarged collateral veins are again noted in left retroperitoneal region. These appear to drain into dilated left ovarian vein. Reproductive: Status post hysterectomy. No adnexal masses. Other: No abdominal wall  hernia or abnormality. No abdominopelvic ascites. Musculoskeletal: No acute or significant osseous findings. IMPRESSION: 1.9 cm nodule seen in superior segment of left lower lobe concerning for primary malignancy or metastatic disease. Also noted is 6 mm nodule in right upper lobe concerning for metastatic disease. Peripheral opacity is noted posteriorly in the right upper lobe concerning for pneumonia, although underlying neoplasm or malignancy cannot be excluded. Continued radiographic follow-up is recommended. Ill-defined low density measuring 6.9  x 4.4 cm is noted in right hepatic lobe which is significantly enlarged compared to prior exam, concerning for metastatic disease or malignancy. Evaluation of the liver is limited due the lack of intravenous contrast. There is interval development of possible 3 cm rounded soft tissue density between the pancreatic tail and stomach concerning for possible neoplasm or malignancy. Further evaluation with MRI is recommended. Diffuse pancreatic atrophy is again noted with multiple parenchymal calcifications. Aortic Atherosclerosis (ICD10-I70.0). Electronically Signed   By: Marijo Conception M.D.   On: 01/06/2019 08:58    Scheduled Meds:  allopurinol  100 mg Oral Daily   aspirin EC  81 mg Oral Q breakfast   cilostazol  50 mg Oral BID   dextrose  1 ampule Intravenous Once   folic acid  1 mg Oral Daily   heparin  5,000 Units Subcutaneous Q8H   insulin aspart  0-5 Units Subcutaneous QHS   insulin aspart  0-9 Units Subcutaneous TID WC   metoprolol tartrate  25 mg Oral BID   pantoprazole  40 mg Oral Daily   sodium chloride flush  3 mL Intravenous Q12H   thiamine  100 mg Oral Daily   Vitamin D (Ergocalciferol)  50,000 Units Oral Q Mon   Continuous Infusions:  dextrose Stopped (01/07/19 0622)     LOS: 0 days   Marylu Lund, MD [facility] Pager On Amion  If 7PM-7AM, please contact night-coverage 01/07/2019, 8:20 AM

## 2019-01-07 NOTE — Progress Notes (Signed)
Initial Nutrition Assessment  RD working remotely.  DOCUMENTATION CODES:   Not applicable  INTERVENTION:   -MVI with minerals daily -Ensure Enlive po TID, each supplement provides 350 kcal and 20 grams of protein  NUTRITION DIAGNOSIS:   Increased nutrient needs related to cancer and cancer related treatments as evidenced by estimated needs.  GOAL:   Patient will meet greater than or equal to 90% of their needs  MONITOR:   PO intake, Supplement acceptance, Labs, Weight trends, Skin, I & O's  REASON FOR ASSESSMENT:   Malnutrition Screening Tool    ASSESSMENT:   This is a 73 year old woman with medical problems including cirrhosis, former alcohol abuse, insulin-dependent diabetes, chronic kidney disease, anemia, COPD on 2 L nasal cannula O2 nightly, and a diagnosis of adenocarcinoma made on a liver biopsy in January 2020, who presents with sweating and right-sided chest pain.  Pt admitted with transient right sided chest pain.   Per oncology notes, pt with metastatic cholangiocarcinoma. Per MD notes, cancer is incurable, however, recommending chemotherapy vs hospice. Palliative care consult pending for goals of care discussions.   5/10- diet downgraded to dysphagia 1 with thin liquids due to pt not having her dentures  Reviewed I/O's: +497 ml x 24 hours  UOP: 500 ml x 24 hours  Per chart review, pt has been lethargic. RD deferred phone interview at this time.   Per previous RD notes, pt with history of poor oral intake and consumed 1-2 Glucerna supplements at home. Noted pt has experienced a 4% wt loss over the past 5 months, which is not significant for time frame. Pt met criteria for malnutrition during prior hospitalization in 10/2018; suspect malnutrition is ongoing, however, unable to identify without additional history and completion of nutrition-focused physical exam.   Pt with poor oral intake and would benefit from nutrient dense supplement. One Ensure Enlive  supplement provides 350 kcals, 20 grams protein, and 44-45 grams of carbohydrate vs one Glucerna shake supplement, which provides 220 kcals, 10 grams of protein, and 26 grams of carbohydrate. Given pt's hx of DM, RD will continue to monitor PO intake, CBGS, and adjust supplement regimen as appropriate.   Lab Results  Component Value Date   HGBA1C 8.4 (H) 01/06/2019   PTA DM medications are 0-9 units insulin aspart TID, 100 mg sitagliptin daily, 3 units insulin glargine daily. Per ADA's Standards of Medical Care for Diabetes, glycemic goals for older adults with multiple comorbities should be less stringent to help prevent hypoglycemia (Hgb A1c 8-8.5).  Labs reviewed: CBGS: 113-217 (inpatient orders for glycemic control are 0-5 units insulin aspart q HS and 0-9 units insulin aspart TID with meals).   NUTRITION - FOCUSED PHYSICAL EXAM:    Most Recent Value  Orbital Region  Unable to assess  Upper Arm Region  Unable to assess  Thoracic and Lumbar Region  Unable to assess  Buccal Region  Unable to assess  Temple Region  Unable to assess  Clavicle Bone Region  Unable to assess  Clavicle and Acromion Bone Region  Unable to assess  Scapular Bone Region  Unable to assess  Dorsal Hand  Unable to assess  Patellar Region  Unable to assess  Anterior Thigh Region  Unable to assess  Posterior Calf Region  Unable to assess  Edema (RD Assessment)  Unable to assess  Hair  Unable to assess  Eyes  Unable to assess  Mouth  Unable to assess  Skin  Unable to assess  Nails  Unable to  assess      Diet Order:   Diet Order            DIET - DYS 1 Room service appropriate? Yes; Fluid consistency: Thin  Diet effective now              EDUCATION NEEDS:   No education needs have been identified at this time  Skin:  Skin Assessment: Reviewed RN Assessment  Last BM:  01/06/19  Height:   Ht Readings from Last 1 Encounters:  01/06/19 5' (1.524 m)    Weight:   Wt Readings from Last 1  Encounters:  01/07/19 54.9 kg    Ideal Body Weight:  45.5 kg  BMI:  Body mass index is 23.63 kg/m.  Estimated Nutritional Needs:   Kcal:  1650-1850  Protein:  80-95 grams  Fluid:  > 1.6 L    Anikah Hogge A. Jimmye Norman, RD, LDN, [place] Registered Dietitian II Certified Diabetes Care and Education Specialist Pager: [phone] After hours Pager: [phone]

## 2019-01-07 NOTE — Progress Notes (Signed)
Patient currently has no IV access.  IV team started last peripheral IV and stated patient had limited options at that time.  [facility] paged and updated. No new orders at this time.

## 2019-01-07 NOTE — Progress Notes (Signed)
IV team attempted IV access including midline and was not successful. Talked with Dr. Wyline Copas and made him aware. Stated to let her rest and attempt later. Will also discontinue the IV fluids ordered
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
## 2019-01-08 DIAGNOSIS — Z7189 Other specified counseling: Secondary | ICD-10-CM

## 2019-01-08 LAB — GLUCOSE, CAPILLARY
Glucose-Capillary: 45 mg/dL — ABNORMAL LOW (ref 70–99)
Glucose-Capillary: 67 mg/dL — ABNORMAL LOW (ref 70–99)
Glucose-Capillary: 67 mg/dL — ABNORMAL LOW (ref 70–99)
Glucose-Capillary: 218 mg/dL — ABNORMAL HIGH (ref 70–99)
Glucose-Capillary: 259 mg/dL — ABNORMAL HIGH (ref 70–99)
Glucose-Capillary: 111 mg/dL — ABNORMAL HIGH (ref 70–99)
Glucose-Capillary: 81 mg/dL (ref 70–99)

## 2019-01-08 NOTE — TOC Progression Note (Addendum)
Transition of Care [facility]) - Progression Note    Patient Details  Name: [name] MRN: [id number] Date of Birth: 08-22-1946  [facility]) CM/SW Contact  Sherrilyn Rist Phone Number: [phone] 01/08/2019, 12:58 PM  Clinical Narrative:    Patient is requesting to return home at discharge with Home Hospice provided by [facility]); referral made as requested; Per patient's brother Simona Huh ( he is in a SNF at present), he ask that all decision making be made by his son Rolan Bucco [phone]); CM talked to Rolan Bucco - nephew for Home hospice choice - he chose [facility].  1:10 pm - Talked to Attending MD, patient lives alone and possibly need SNF placement at discharge; Physical Therapy to re- eval for disposition needs; CM talked to [place] with [facility], possibly follow pt at the nursing facility outpatient. CM will continue to follow for progression of care.     Expected Discharge Plan: Home w Hospice Care Barriers to Discharge: No Barriers Identified  Expected Discharge Plan and Services Expected Discharge Plan: [place] In-house Referral: NA Discharge Planning Services: CM Consult Post Acute Care Choice: NA Living arrangements for the past 2 months: Single Family Home                 DME Arranged: N/A DME Agency: NA         HH Agency: Hospice and [place] Date [place]: 01/08/19 Time [place]: 62 Representative spoke with at [place]: receptionist - a rep from [place] to call CM   Social Determinants of Health (SDOH) Interventions    Readmission Risk Interventions Readmission Risk Prevention Plan 11/15/2018  Transportation Screening Complete  Medication Review Press photographer) Complete  PCP or Specialist appointment within 3-5 days of discharge Complete  HRI or [place] Not Complete  HRI or Home Care Consult Pt Refusal Comments N/A  SW Recovery  Care/Counseling Consult Complete  Palliative Care Screening Not Complete  Comments N/A  Skilled Nursing Facility Complete  Some recent data might be hidden

## 2019-01-08 NOTE — Progress Notes (Signed)
Hypoglycemic Event  CBG: 67  Treatment: 4 oz juice/soda  Symptoms: None  Follow-up CBG: Time: 0935 CBG Result: 218  Possible Reasons for Event: Inadequate meal intake

## 2019-01-08 NOTE — Progress Notes (Signed)
COURTESY NOTE:  Report from pallitive careand other notes reviewed. Disposition is no life-prolonging treatment, instead best supportive care with Hospice support. I agree with that disposition.  I will sign off at this time. Please let me know if I can be of further help.

## 2019-01-08 NOTE — Progress Notes (Signed)
Manufacturing engineer [facility]) - Palliative Care   Received request for hospice vs palliative care services after discharge. Confirmed request with [facility] Rowan. Spoke with nephew Rolan Bucco by phone to confirm interest in Palliative Care services at SNF with plan to transition to Hospice service when appropriate. Rolan Bucco expressed interest in [facility] where patient's brother is a LTC patient. Made RNCM Hassan Rowan aware.   Please include in discharge summary order for [place] to follow at facility.  [facility] hospital team will continue to follow to arrange for services after discharge.  Thank you,  Heloise Purpura [phone]  [facility] hospital liaisons are listed daily on AMION under Hospice and [place].

## 2019-01-08 NOTE — Progress Notes (Addendum)
PROGRESS NOTE    [name]  GBT:[id number] DOB: 11-Oct-1945 DOA: 01/06/2019 PCP: Nolene Ebbs, MD    Brief Narrative:  73 year old woman with medical problems including cirrhosis, former alcohol abuse, insulin-dependent diabetes, chronic kidney disease, anemia, COPD on 2 L nasal cannula O2 nightly, and a diagnosis of adenocarcinoma made on a liver biopsy in January 2020, who presents with sweating and right-sided chest pain.  Patient reports at baseline she lives alone, is retired from working for the [employer].  Reports she has not drank alcohol in over a year, ambulates with the use of a walker, has an aide that comes 2 hours on most days that assists her with her IADLs.  The evening prior to admission she reports that she had an episode of diaphoresis and trembling, almost passed out.  In addition she had right-sided chest pain described as sharp, lasted for less than 1 hour.  She is never had this pain before.  She called a friend of hers who came to her home, blood sugar check at that time was 292.  Other symptoms she reports is intermittent bleeding per rectum, although none today.  She reports having oral pain in her gums that she takes oxycodone for intermittently.  She identifies a primary care physician, she reports she has not seen in months.  The patient reports she does not know of her cancer diagnosis.  She has 4 children who are living, her father is also alive age 8 who lives locally.  She denies any pain at this time, no nausea, vomiting, no fevers.  ED Course: In the emergency department vital signs remarkable for normal heart rate, respiratory rate maximum 21, systolic blood pressure ranging from 100 to 139 mmHg.  Lab results remarkable for undetectable troponin.  Ammonia within normal limits.  Albumin of 1.8.  Potassium of 5.8.  Creatinine of 1.97.  Hemoglobin of 8.3.  White count of 12.9, neutrophil predominance.  CT scan of the head without contrast no acute  findings.  Chest x-ray read as no active cardiopulmonary disease.  EKG without ST segment elevations.  Hospital medicine was consulted for further management.  Assessment & Plan:   Active Problems:   Chest pain   Hyperkalemia   Metastatic cancer ([place])   DNR (do not resuscitate)   Palliative care encounter   Encounter for hospice care discussion   #Transient right sided CP -Noted to be of single occurrence, lasted < 60 minutes, associated with diaphoresis and pre-syncope.  EKG without ST segment changes and initial troponin WNL.   -Troponin noted to be neg    #Metastatic Adenocarcinoma involving lungs, liver, pancreatic tail -Underwent liver biopsy on 09/25/2018 that revealed adenocarcinoma, favored upper GI / pancreaticobiliary or primary cholangiocarcinoma.   -MRI on 09/23/2018 revealed multiple lesions, dominant mass in segment 8 measuring 7.9 x 5.4 x 5.8 cm.   -Personally reviewed CT abd/pelvis, chest with findings of multiple metastatic foci present. Most notably, liver lesion significantly enlarged over prior imaging and 3cm lesion at tail of pancreas -Appreciate input by Oncology. Difficult situation. Incurable metastatic disease with expected prognosis of <33mos without treatment. Any treatment would be palliative. -Palliative Care consulted for assistance determining patient's goals of care/code status -Appreciate input by palliative care. Pt is DNR. Initial plan for home with hospice, however, since patient lives by herself, considering SNF placement for patient safety. Discussed with SW  #Cirrhosis:  -Presented with  PT INR of 1.3, albumin 1.8 -Seems stable currently  #HTN:  -BP appears  to be controlled -continue home BB as patient tolerates  #Anemia:  -hb of 8.3 on admission -obtaining iron studies to further evaluate  #COPD:  -Pt noted to be on 2L [place] O2 qhs and as needed bronchodilator -stable at present. On minimal O2 support  #Chronic gingival pain: -continue  home oxycodone prn for pain  #GERD:  -Continue with PPI as tolerated  #Gout:  -No evidence of acute exacerbation, continue allopurinol 100 mg  #Hyperkalemia -Potassium on admission K of 5.8;  -Resolved  -Insulin dependent DM with symptomatic hypoglycemia -Pt reported taking 10 units of basal insulin daily, but cannot recall rapid acting insulin regimen. -On 5/10, pt noted to be profoundly hypoglycemic with glucose in the 30's, symptomatic -scheduled insulin stopped -Suspect labile glucose related to malignancy -Cont on SSI coverage for now  #Peripheral arterial disease -continue home cilostazol as tolerated -Seems stable at present  #CKD:  -Baseline Cr noted to be around 2 -Cr improved -Voiding well  #Toxic metabolic encephalopathy -Markedly confused this AM, unable to converse -Likely secondary to symptomatic hypoglycemia -Improved with correcting glucose, appears baseline this AM  #Stage 3 CKD -Cr improved with IVF -Stable at this time  DVT prophylaxis: Heparin subq Code Status: Full Family Communication: Pt in room, family not at bedside, updated family over phone on 5/10 Disposition Plan: Uncertain at this time  Consultants:   Oncology  Palliative Care  Procedures:     Antimicrobials: Anti-infectives (From admission, onward)   None      Subjective: Without complaints.      Objective: Vitals:   01/08/19 0101 01/08/19 0229 01/08/19 0459 01/08/19 1121  BP: 116/60  129/63 (!) 149/77  Pulse: 86  81 89  Resp: 16  20 18   Temp: 98.1 F (36.7 C)  99.2 F (37.3 C) 98 F (36.7 C)  TempSrc: Oral  Oral   SpO2: 98%  100% 100%  Weight:  53.3 kg    Height:        Intake/Output Summary (Last 24 hours) at 01/08/2019 1623 Last data filed at 01/08/2019 1407 Gross per 24 hour  Intake 460 ml  Output 200 ml  Net 260 ml   Filed Weights   01/06/19 0651 01/07/19 0541 01/08/19 0229  Weight: 53 kg 54.9 kg 53.3 kg    Examination: General exam:  Conversant, in no acute distress Respiratory system: normal chest rise, clear, no audible wheezing Cardiovascular system: regular rhythm, s1-s2 Gastrointestinal system: Nondistended, nontender, pos BS Central nervous system: No seizures, no tremors Extremities: No cyanosis, no joint deformities Skin: No rashes, no pallor Psychiatry: Affect normal // no auditory hallucinations   Data Reviewed: I have personally reviewed following labs and imaging studies  CBC: Recent Labs  Lab 01/06/19 0248  WBC 12.9*  NEUTROABS 9.0*  HGB 8.3*  HCT 28.1*  MCV 82.2  PLT 878   Basic Metabolic Panel: Recent Labs  Lab 01/06/19 0248 01/06/19 1127  NA 135 132*  K 5.8* 4.4  CL 110 107  CO2 15* 18*  GLUCOSE 315* 37*  BUN 24* 22  CREATININE 1.97* 1.71*  CALCIUM 8.3* 7.8*   GFR: Estimated Creatinine Clearance: 20.7 mL/min (A) (by C-G formula based on SCr of 1.71 mg/dL (H)). Liver Function Tests: Recent Labs  Lab 01/06/19 0248  AST 26  ALT 17  ALKPHOS 262*  BILITOT 0.4  PROT 5.2*  ALBUMIN 1.8*   No results for input(s): LIPASE, AMYLASE in the last 168 hours. Recent Labs  Lab 01/06/19 0252  AMMONIA 38*  Coagulation Profile: Recent Labs  Lab 01/06/19 0248  INR 1.3*   Cardiac Enzymes: Recent Labs  Lab 01/06/19 0248 01/06/19 0534  TROPONINI <0.03 <0.03   BNP (last 3 results) No results for input(s): PROBNP in the last 8760 hours. HbA1C: Recent Labs    01/06/19 0534  HGBA1C 8.4*   CBG: Recent Labs  Lab 01/08/19 0625 01/08/19 0812 01/08/19 0934 01/08/19 1149 01/08/19 1614  GLUCAP 67* 67* 218* 259* 111*   Lipid Profile: No results for input(s): CHOL, HDL, LDLCALC, TRIG, CHOLHDL, LDLDIRECT in the last 72 hours. Thyroid Function Tests: No results for input(s): TSH, T4TOTAL, FREET4, T3FREE, THYROIDAB in the last 72 hours. Anemia Panel: Recent Labs    01/06/19 0534  FERRITIN 54  TIBC 113*  IRON 16*   Sepsis Labs: No results for input(s): PROCALCITON,  LATICACIDVEN in the last 168 hours.  Recent Results (from the past 240 hour(s))  SARS Coronavirus 2 (CEPHEID - Performed in [facility] hospital lab), Hosp Order     Status: None   Collection Time: 01/06/19  4:39 AM  Result Value Ref Range Status   SARS Coronavirus 2 NEGATIVE NEGATIVE Final    Comment: (NOTE) If result is NEGATIVE SARS-CoV-2 target nucleic acids are NOT DETECTED. The SARS-CoV-2 RNA is generally detectable in upper and lower  respiratory specimens during the acute phase of infection. The lowest  concentration of SARS-CoV-2 viral copies this assay can detect is 250  copies / mL. A negative result does not preclude SARS-CoV-2 infection  and should not be used as the sole basis for treatment or other  patient management decisions.  A negative result may occur with  improper specimen collection / handling, submission of specimen other  than nasopharyngeal swab, presence of viral mutation(s) within the  areas targeted by this assay, and inadequate number of viral copies  (<250 copies / mL). A negative result must be combined with clinical  observations, patient history, and epidemiological information. If result is POSITIVE SARS-CoV-2 target nucleic acids are DETECTED. The SARS-CoV-2 RNA is generally detectable in upper and lower  respiratory specimens dur ing the acute phase of infection.  Positive  results are indicative of active infection with SARS-CoV-2.  Clinical  correlation with patient history and other diagnostic information is  necessary to determine patient infection status.  Positive results do  not rule out bacterial infection or co-infection with other viruses. If result is PRESUMPTIVE POSTIVE SARS-CoV-2 nucleic acids MAY BE PRESENT.   A presumptive positive result was obtained on the submitted specimen  and confirmed on repeat testing.  While 2019 novel coronavirus  (SARS-CoV-2) nucleic acids may be present in the submitted sample  additional confirmatory  testing may be necessary for epidemiological  and / or clinical management purposes  to differentiate between  SARS-CoV-2 and other Sarbecovirus currently known to infect humans.  If clinically indicated additional testing with an alternate test  methodology [phone]) is advised. The SARS-CoV-2 RNA is generally  detectable in upper and lower respiratory sp ecimens during the acute  phase of infection. The expected result is Negative. Fact Sheet for Patients:  StrictlyIdeas.no Fact Sheet for Healthcare Providers: BankingDealers.co.za This test is not yet approved or cleared by the [place] FDA and has been authorized for detection and/or diagnosis of SARS-CoV-2 by FDA under an Emergency Use Authorization (EUA).  This EUA will remain in effect (meaning this test can be used) for the duration of the COVID-19 declaration under Section 564(b)(1) of the Act, 21 U.S.C. section  360bbb-3(b)(1), unless the authorization is terminated or revoked sooner. Performed at [facility], [place] [address] [place], [place] [address]      Radiology Studies: No results found.  Scheduled Meds:  allopurinol  100 mg Oral Daily   aspirin EC  81 mg Oral Q breakfast   cilostazol  50 mg Oral BID   dextrose  1 ampule Intravenous Once   feeding supplement (ENSURE ENLIVE)  237 mL Oral TID BM   folic acid  1 mg Oral Daily   heparin  5,000 Units Subcutaneous Q8H   insulin aspart  0-5 Units Subcutaneous QHS   insulin aspart  0-9 Units Subcutaneous TID WC   metoprolol tartrate  25 mg Oral BID   pantoprazole  40 mg Oral Daily   sodium chloride flush  3 mL Intravenous Q12H   Continuous Infusions:  sodium chloride     dextrose Stopped (01/07/19 0622)     LOS: 1 day   Marylu Lund, MD [facility] Pager On Amion  If 7PM-7AM, please contact night-coverage 01/08/2019, 4:23 PM

## 2019-01-08 NOTE — Progress Notes (Signed)
Physical Therapy Treatment Patient Details Name: [name] MRN: [id number] DOB: [birth year] Today's Date: 01/08/2019    History of Present Illness Pt is a 73 y.o. female admitted 01/06/19 with sweating and transient R-side chest pain. Of note recent dx of adenocarcinoma in 08/2018 with metastatic lesions. Other PMH includes cirrhosis, DM, CKD, anemia, COPD (2L O2 at night).   PT Comments    Pt limited by increased fatigue this session; awakening to voice, but requiring max cues to stay awake and attend to conversation. Currently modA for bed mobility; declining OOB/ambulation due to fatigue. SpO2 94% on RA. Increased time spent discussing recommendation for SNF-level therapies to maximize functional mobility and independence; pt in agreement as she will not have 24/7 support available. Will continue to follow acutely.    Follow Up Recommendations  SNF;Supervision/Assistance - 24 hour     Equipment Recommendations  (TBD)    Recommendations for Other Services       Precautions / Restrictions Precautions Precautions: Fall Restrictions Weight Bearing Restrictions: No    Mobility  Bed Mobility Overal bed mobility: Needs Assistance             General bed mobility comments: ModA to slide up and reposition in bed; pt reports significant fatigue  Transfers                 General transfer comment: Declined due to fatigue  Ambulation/Gait                 Stairs             Wheelchair Mobility    Modified Rankin (Stroke Patients Only)       Balance                                            Cognition Arousal/Alertness: Lethargic Behavior During Therapy: Flat affect Overall Cognitive Status: No family/caregiver present to determine baseline cognitive functioning                                 General Comments: Asleep at beginning of session, awoke to verbal stimulus but took significant increased time wake  up and converse appropriately, pt initially dosing off in middle of conversation      Exercises      General Comments General comments (skin integrity, edema, etc.): Increased time spent discussing SNF      Pertinent Vitals/Pain Pain Assessment: No/denies pain    Home Living                      Prior Function            PT Goals (current goals can now be found in the care plan section) Acute Rehab PT Goals Patient Stated Goal: to get stronger PT Goal Formulation: With patient Time For Goal Achievement: 01/21/19 Potential to Achieve Goals: Fair Progress towards PT goals: Progressing toward goals    Frequency    Min 3X/week      PT Plan Discharge plan needs to be updated    Co-evaluation              AM-PAC PT "6 Clicks" Mobility   Outcome Measure  Help needed turning from your back to your side while in a flat bed without using bedrails?: A Lot  Help needed moving from lying on your back to sitting on the side of a flat bed without using bedrails?: A Little Help needed moving to and from a bed to a chair (including a wheelchair)?: A Little Help needed standing up from a chair using your arms (e.g., wheelchair or bedside chair)?: A Little Help needed to walk in hospital room?: A Little Help needed climbing 3-5 steps with a railing? : A Lot 6 Click Score: 16    End of Session   Activity Tolerance: Patient limited by fatigue Patient left: in bed;with call bell/phone within reach;with bed alarm set Nurse Communication: Mobility status PT Visit Diagnosis: Unsteadiness on feet (R26.81);Difficulty in walking, not elsewhere classified (R26.2)     Time: 1740-8144 PT Time Calculation (min) (ACUTE ONLY): 16 min  Charges:  $Self Care/Home Management: [place], PT, DPT Acute Rehabilitation Services  Pager [phone] Office [place] 01/08/2019, 3:50 PM

## 2019-01-08 NOTE — Progress Notes (Signed)
Inpatient Diabetes Program Recommendations  AACE/ADA: New Consensus Statement on Inpatient Glycemic Control (2015)  Target Ranges:  Prepandial:   less than 140 mg/dL      Peak postprandial:   less than 180 mg/dL (1-2 hours)      Critically ill patients:  140 - 180 mg/dL   Lab Results  Component Value Date   GLUCAP 67 (L) 01/08/2019   HGBA1C 8.4 (H) 01/06/2019    Review of Glycemic Control  Diabetes history: DM2 Outpatient Diabetes medications: Lantus 10 units QHS Current orders for Inpatient glycemic control: Novolog 0-9 units tidwc and hs  Hypoglycemia this am. Taking supplements tid May benefit by changing Novolog 0-9 units to [facility]   Inpatient Diabetes Program Recommendations:     Consider changing Novolog 0-9 units to Q6H. Would not add any Lantus at this point d/t hypoglycemia  Continue to follow.   Thank you. Lorenda Peck, RD, LDN, CDE Inpatient Diabetes Coordinator [phone]

## 2019-01-08 NOTE — Progress Notes (Addendum)
Daily Progress Note   Patient Name: [name]       Date: 01/08/2019 DOB: [birth year]  Age: 73 y.o. MRN#: [id number] Attending Physician: Donne Hazel, MD Primary Care Physician: Nolene Ebbs, MD Admit Date: 01/06/2019  Reason for Consultation/Follow-up: Disposition, Establishing goals of care and Psychosocial/spiritual support  Subjective: Ms. [name] seemed in good spirits this morning. She spent some more time reflecting on growing up with 4 brothers to help take care of.   When asked about her pain, she was not in any currently, but notes that it will come on intermittently. She takes a pain pill at home which usually helps. She says she eats "anything green" to keep her bowel movements regular, approximately every 1-2 days.   We discussed that hypoglycemia likely contributed to her initial symptoms at presentation and that she would no longer be on insulin when she goes home.   It was evident that Ms. [name] wished to return home as soon as she could. I explained what hospice services are and how these can provide further help for her at home, in addition to her home health aid. She was agreeable to these services.   After speaking with [name] we talked to her brother [name] - trying to ensure that family will check on her at least once a day.  [name] told me clearly that I needed to speak to her other brothers because he could not care for her.  I then called [name] (who did not answer) and [name].  [name] clearly said that he could not check on his sister and that she needed to be in a nursing home.  Assessment: Metastatic cholangiocarcinoma.  Able to walk 60 feet with rolling walker.  Eating small amounts.  Patient Profile/HPI: 73 y.o. female  with past medical history of  COPD on 2 L of oxygen at home, peripheral vascular disease, diabetes mellitus, diastolic heart failure, alcoholic cirrhosis,and metastatic cancer who was admitted on 01/06/2019 with right-sided chest pain and symptoms of dizziness, diaphoresis, and nausea. Oncology consult recommended supportive care and hospice services based on advanced disease and limited function status and family support to undergo chemotherapy.    Length of Stay: 1  Current Medications: Scheduled Meds:  . allopurinol  100 mg Oral Daily  . aspirin EC  81 mg Oral  Q breakfast  . cilostazol  50 mg Oral BID  . dextrose  1 ampule Intravenous Once  . feeding supplement (ENSURE ENLIVE)  237 mL Oral TID BM  . folic acid  1 mg Oral Daily  . heparin  5,000 Units Subcutaneous Q8H  . insulin aspart  0-5 Units Subcutaneous QHS  . insulin aspart  0-9 Units Subcutaneous TID WC  . metoprolol tartrate  25 mg Oral BID  . pantoprazole  40 mg Oral Daily  . sodium chloride flush  3 mL Intravenous Q12H    Continuous Infusions: . sodium chloride    . dextrose Stopped (01/07/19 0622)    PRN Meds: albuterol, hydrALAZINE, ondansetron (ZOFRAN) IV, oxyCODONE  Physical Exam Vitals signs reviewed.  Constitutional:      General: She is not in acute distress. Abdominal:     Palpations: Abdomen is soft.     Tenderness: There is abdominal tenderness in the right upper quadrant.  Neurological:     General: No focal deficit present.     Mental Status: She is alert and oriented to person, place, and time.    Vital Signs: BP 129/63 (BP Location: Right Arm)   Pulse 81   Temp 99.2 F (37.3 C) (Oral)   Resp 20   Ht 5' (1.524 m)   Wt 53.3 kg Comment: C scale  SpO2 100%   BMI 22.93 kg/m  SpO2: SpO2: 100 % O2 Device: O2 Device: Room Air O2 Flow Rate: O2 Flow Rate (L/min): 2 L/min  Intake/output summary:   Intake/Output Summary (Last 24 hours) at 01/08/2019 1034 Last data filed at 01/08/2019 0600 Gross per 24 hour  Intake 220 ml   Output 200 ml  Net 20 ml   LBM: Last BM Date: 01/06/19 Baseline Weight: Weight: 53 kg Most recent weight: Weight: 53.3 kg(C scale)       Palliative Assessment/Data: 50%   Flowsheet Rows     Most Recent Value  Intake Tab  Referral Department  -- [hematology]  Unit at Time of Referral  Med/Surg Unit  Palliative Care Primary Diagnosis  Cancer  Date Notified  01/06/19  Palliative Care Type  New Palliative care  Reason for referral  Clarify Goals of Care  Date of Admission  01/06/19  # of days IP prior to Palliative referral  0  Clinical Assessment  Psychosocial & Spiritual Assessment  Palliative Care Outcomes      Patient Active Problem List   Diagnosis Date Noted  . Metastatic cancer ([place])   . DNR (do not resuscitate)   . Palliative care encounter   . Chest pain 01/06/2019  . Hyperkalemia   . Malnutrition of moderate degree 11/15/2018  . Type II diabetes mellitus with renal manifestations ([place]) 11/12/2018  . Tobacco abuse 11/12/2018  . Generalized weakness 11/12/2018  . Chronic respiratory failure with hypoxia ([place]) 11/12/2018  . Internal hemorrhoid, bleeding 09/26/2018  . Liver masses 09/26/2018  . Leukocytosis 09/26/2018  . AKI (acute kidney injury) ([place]) 08/28/2018  . Gout 08/10/2015  . Hypoglycemia   . Hypothermia   . UTI (lower urinary tract infection)   . Humeral head fracture 06/05/2015  . Idiopathic chronic gout of multiple sites without tophus 06/05/2015  . Chronic renal disease, stage III ([place]) 06/05/2015  . Nausea vomiting and diarrhea 05/09/2015  . Increased urinary frequency 05/09/2015  . Sepsis ([place]) 05/09/2015  . Hypokalemia   . Chronic diastolic congestive heart failure ([place])   . Hyperlipidemia 02/10/2015  . Cellulitis 11/04/2014  . Essential  hypertension 08/27/2014  . Abdominal pain, acute, bilateral lower quadrant 07/26/2014  . Colitis 07/26/2014  . GI bleeding 06/14/2014  . Bright red blood per rectum 06/14/2014  . Acute encephalopathy  06/14/2014  . Personal history of colonic polyps 08/06/2013  . Knee pain, acute 05/20/2013  . Claudication ([place]) 03/13/2013  . Obesity 03/13/2013  . PVD, LEIA/LIIA and RCIA pta 7/09- ABIs 0.68 and 0.69 Feb 2014 11/17/2012  . PUD (peptic ulcer disease) GI bleed in past (2011) 11/17/2012  . Cirrhosis of liver ([place]) 11/17/2012  . Colon cancer, lap chole 2012 11/17/2012  . 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 Atypical chest pain- no history of CAD. Myoview low risk 2012 11/15/2012  . Controlled diabetes mellitus type 2 with complications ([place]) 67/89/3810  . Acute renal failure superimposed on stage 3 chronic kidney disease ([place]) 06/16/2012  . Incisional hernia 01/09/2012  . Anemia 01/28/2011  . COPD exacerbation ([place]) 01/28/2011  . Sleep apnea 01/28/2011  . Transfusion history 01/28/2011  . Sinus problem 01/28/2011  . Full dentures 01/28/2011    Palliative Care Plan    Recommendations/Plan:  After discussing with patient's daughter, it was evident that patient does not wish to proceed with chemo or aggressive treatment.   Hospice services were explained to patient this morning which she is agreeable to upon discharge.  Family has requested SNF.  She has multiple disposition options and is able to choose.   She is at high risk for readmission if she discharges to home or SNF.    Continue symptom management for abdominal pain  Agree with plan to discontinue insulin   Goals of Care and Additional Recommendations:  Limitations on Scope of Treatment: No Chemotherapy and No Surgical Procedures  Code Status:  DNR  Prognosis:   < 6 months   Discharge Planning:  Home with Hospice vs SNF  Care plan was discussed with Dr. Wyline Copas and her brother, [name]   Thank you for allowing the Palliative Medicine Team to assist in the care of this patient.  Total time spent: 60 minutes       Greater than 50%  of this time was spent counseling and coordinating care  related to the above assessment and plan.  Modena Nunnery D, DO   PGY-1 01/08/2019, 11:50 am  Please contact Palliative MedicineTeam phone at [phone] for questions and concerns between 7 am - 7 pm.   Please see AMION for individual provider pager numbers.

## 2019-01-09 DIAGNOSIS — R079 Chest pain, unspecified: Secondary | ICD-10-CM

## 2019-01-09 DIAGNOSIS — E875 Hyperkalemia: Secondary | ICD-10-CM

## 2019-01-09 LAB — GLUCOSE, CAPILLARY
Glucose-Capillary: 361 mg/dL — ABNORMAL HIGH (ref 70–99)
Glucose-Capillary: 93 mg/dL (ref 70–99)
Glucose-Capillary: 122 mg/dL — ABNORMAL HIGH (ref 70–99)
Glucose-Capillary: 300 mg/dL — ABNORMAL HIGH (ref 70–99)

## 2019-01-09 MED ORDER — ONDANSETRON 4 MG PO TBDP
4.0000 mg | ORAL_TABLET | Freq: Three times a day (TID) | ORAL | Status: DC | PRN
  Administered 2019-01-09: 4 mg via ORAL
  Filled 2019-01-09: qty 1

## 2019-01-09 NOTE — [place] FL2 (Signed)
[place] MEDICAID FL2 LEVEL OF CARE SCREENING TOOL     IDENTIFICATION  Patient Name: [name] Birthdate: [birth year] Sex: female Admission Date (Current Location): 01/06/2019  [facility] [place] Number:  Herbalist and Address:  [facility], [place] [address] [place], [place] [address]      Provider Number: [id number]  Attending Physician Name and Address:  Mercy Riding, MD  Relative Name and Phone Number:  Rolan Bucco    [phone]     Current Level of Care: Hospital Recommended Level of Care: [place] Prior Approval Number:    Date Approved/Denied: 06/18/14 PASRR Number: [id number]  Discharge Plan: SNF    Current Diagnoses: Patient Active Problem List   Diagnosis Date Noted  . Encounter for hospice care discussion   . Metastatic cancer ([place])   . DNR (do not resuscitate)   . Palliative care encounter   . Chest pain 01/06/2019  . Hyperkalemia   . Malnutrition of moderate degree 11/15/2018  . Type II diabetes mellitus with renal manifestations ([place]) 11/12/2018  . Tobacco abuse 11/12/2018  . Generalized weakness 11/12/2018  . Chronic respiratory failure with hypoxia ([place]) 11/12/2018  . Internal hemorrhoid, bleeding 09/26/2018  . Liver masses 09/26/2018  . Leukocytosis 09/26/2018  . AKI (acute kidney injury) ([place]) 08/28/2018  . Gout 08/10/2015  . Hypoglycemia   . Hypothermia   . UTI (lower urinary tract infection)   . Humeral head fracture 06/05/2015  . Idiopathic chronic gout of multiple sites without tophus 06/05/2015  . Chronic renal disease, stage III ([place]) 06/05/2015  . Nausea vomiting and diarrhea 05/09/2015  . Increased urinary frequency 05/09/2015  . Sepsis ([place]) 05/09/2015  . Hypokalemia   . Chronic diastolic congestive heart failure ([place])   . Hyperlipidemia 02/10/2015  . Cellulitis 11/04/2014  . Essential hypertension 08/27/2014  . Abdominal pain, acute, bilateral lower quadrant 07/26/2014   . Colitis 07/26/2014  . GI bleeding 06/14/2014  . Bright red blood per rectum 06/14/2014  . Acute encephalopathy 06/14/2014  . Personal history of colonic polyps 08/06/2013  . Knee pain, acute 05/20/2013  . Claudication ([place]) 03/13/2013  . Obesity 03/13/2013  . PVD, LEIA/LIIA and RCIA pta 7/09- ABIs 0.68 and 0.69 Feb 2014 11/17/2012  . PUD (peptic ulcer disease) GI bleed in past (2011) 11/17/2012  . Cirrhosis of liver ([place]) 11/17/2012  . Colon cancer, lap chole 2012 11/17/2012  . 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 Atypical chest pain- no history of CAD. Myoview low risk 2012 11/15/2012  . Controlled diabetes mellitus type 2 with complications ([place]) 93/79/0240  . Acute renal failure superimposed on stage 3 chronic kidney disease ([place]) 06/16/2012  . Incisional hernia 01/09/2012  . Anemia 01/28/2011  . COPD exacerbation ([place]) 01/28/2011  . Sleep apnea 01/28/2011  . Transfusion history 01/28/2011  . Sinus problem 01/28/2011  . Full dentures 01/28/2011    Orientation RESPIRATION BLADDER Height & Weight     Self, Time, Situation, Place  Normal Continent Weight: 116 lb 4.8 oz (52.8 kg) Height:  5' (152.4 cm)  BEHAVIORAL SYMPTOMS/MOOD NEUROLOGICAL BOWEL NUTRITION STATUS      Continent Diet(see discharge summary)  AMBULATORY STATUS COMMUNICATION OF NEEDS Skin   Limited Assist Verbally Normal                       Personal Care Assistance Level of Assistance  Bathing, Feeding, Dressing, Total care Bathing Assistance: Limited assistance Feeding assistance:  Independent Dressing Assistance: Limited assistance Total Care Assistance: Limited assistance   Functional Limitations Info  Sight, Hearing, Speech Sight Info: Impaired Hearing Info: Adequate Speech Info: Adequate    SPECIAL CARE FACTORS FREQUENCY  PT (By licensed PT), OT (By licensed OT)     PT Frequency: min 5x weekly OT Frequency: min 5x weekly            Contractures  Contractures Info: Not present    Additional Factors Info  Code Status, Allergies Code Status Info: DNR Allergies Info: Compazine (prochlorperazine Edisylate) Penicillins, Phenergan (promethazine Hcl), Lipitor (atorvastatin), Chocolate, Lactose Intolerance (gi), Nuts - Diverticulities , Pork-derived Products           Current Medications (01/09/2019):  This is the current hospital active medication list Current Facility-Administered Medications  Medication Dose Route Frequency Provider Last Rate Last Dose  . 0.9 %  sodium chloride infusion   Intravenous Continuous Bensimhon, Shaune Pascal, MD      . albuterol (PROVENTIL) (2.5 MG/3ML) 0.083% nebulizer solution 2.5 mg  2.5 mg Nebulization Q6H PRN Vilma Prader, MD      . allopurinol (ZYLOPRIM) tablet 100 mg  100 mg Oral Daily Vilma Prader, MD   100 mg at 01/09/19 0934  . aspirin EC tablet 81 mg  81 mg Oral Q breakfast Vilma Prader, MD   81 mg at 01/09/19 0804  . cilostazol (PLETAL) tablet 50 mg  50 mg Oral BID Vilma Prader, MD   50 mg at 01/09/19 0933  . dextrose 5 % solution   Intravenous Continuous Donne Hazel, MD   Stopped at 01/07/19 1245  . dextrose 50 % solution 50 mL  1 ampule Intravenous Once Donne Hazel, MD      . feeding supplement (ENSURE ENLIVE) (ENSURE ENLIVE) liquid 237 mL  237 mL Oral TID BM Donne Hazel, MD   237 mL at 01/08/19 2033  . folic acid (FOLVITE) tablet 1 mg  1 mg Oral Daily Vilma Prader, MD   1 mg at 01/09/19 0933  . heparin injection 5,000 Units  5,000 Units Subcutaneous Q8H Vilma Prader, MD   5,000 Units at 01/09/19 0536  . hydrALAZINE (APRESOLINE) injection 5 mg  5 mg Intravenous Q4H PRN Donne Hazel, MD      . insulin aspart (novoLOG) injection 0-5 Units  0-5 Units Subcutaneous QHS Vilma Prader, MD      . insulin aspart (novoLOG) injection 0-9 Units  0-9 Units Subcutaneous TID WC Vilma Prader, MD   9 Units at 01/09/19 0804  . metoprolol tartrate (LOPRESSOR)  tablet 25 mg  25 mg Oral BID Vilma Prader, MD   25 mg at 01/09/19 0933  . ondansetron (ZOFRAN) injection 4 mg  4 mg Intravenous Q6H PRN Donne Hazel, MD      . ondansetron (ZOFRAN-ODT) disintegrating tablet 4 mg  4 mg Oral Q8H PRN Schorr, Rhetta Mura, NP   4 mg at 01/09/19 0316  . oxyCODONE (Oxy IR/ROXICODONE) immediate release tablet 5 mg  5 mg Oral Q6H PRN Vilma Prader, MD   5 mg at 01/08/19 2241  . pantoprazole (PROTONIX) EC tablet 40 mg  40 mg Oral Daily Vilma Prader, MD   40 mg at 01/09/19 0933  . sodium chloride flush (NS) 0.9 % injection 3 mL  3 mL Intravenous Q12H Vilma Prader, MD   3 mL at 01/08/19 2124     Discharge Medications: Please see discharge  summary for a list of discharge medications.  Relevant Imaging Results:  Relevant Lab Results:   Additional Information SSN: [id number]  Alberteen Sam, LCSW

## 2019-01-09 NOTE — Progress Notes (Signed)
Inpatient Diabetes Program Recommendations  AACE/ADA: New Consensus Statement on Inpatient Glycemic Control (2015)  Target Ranges:  Prepandial:   less than 140 mg/dL      Peak postprandial:   less than 180 mg/dL (1-2 hours)      Critically ill patients:  140 - 180 mg/dL   Lab Results  Component Value Date   GLUCAP 361 (H) 01/09/2019   HGBA1C 8.4 (H) 01/06/2019    Review of Glycemic Control Results for [name], [name] (MRN [id number]) as of 01/09/2019 10:42  Ref. Range 01/08/2019 11:49 01/08/2019 16:14 01/08/2019 21:20 01/09/2019 06:58  Glucose-Capillary Latest Ref Range: 70 - 99 mg/dL 259 (H) 111 (H) 81 361 (H)   Diabetes history: DM2 Outpatient Diabetes medications: Lantus 10 units QHS Current orders for Inpatient glycemic control: Novolog 0-9 units tidwc and hs  Taking supplements tid May benefit by changing Novolog 0-9 units to [facility]   Inpatient Diabetes Program Recommendations:     Consider changing Novolog 0-9 units to Q6H.  Thanks, Bronson Curb, MSN, RNC-OB Diabetes Coordinator [phone] (8a-5p)

## 2019-01-09 NOTE — Progress Notes (Signed)
PROGRESS NOTE  [name] SWN:[id number] DOB: 08-Apr-1946 DOA: 01/06/2019 PCP: Nolene Ebbs, MD   LOS: 2 days   Patient is from: Home  Brief Narrative / Interim history: 73 year old female with history of cirrhosis, former alcohol use disorder, IDDM-2, CKD, anemia, COPD on 2 L by nasal cannula and metastatic cancer to multiple organs who presented with diaphoresis, trembling, near syncope and right-sided chest pain.  Really did not know her cancer diagnosis.  In ED, hemodynamically stable.  CBC with hemoglobin to 8.3 mild leukocytosis to 12.9 with left shift.  Undetectable troponin.  Ammonia within normal range.  Potassium 5.8.  Creatinine 1.97.  CT head without contrast no acute finding.  CXR without cardiopulmonary finding.  EKG without acute ischemic finding.   Subjective: No major events overnight of this morning.  No complaints this morning.  Denies chest pain or dyspnea.  Reports mild abdominal pain.   Assessment & Plan: Active Problems:   Chest pain   Hyperkalemia   Metastatic cancer ([place])   DNR (do not resuscitate)   Palliative care encounter   Encounter for hospice care discussion  Transient right-sided chest pain: Resolved. -Cardiopulmonary work-up not significant.  Metastatic adenocarcinoma involving lungs, liver and pancreatic tail -Reportedly incurable and poor prognosis about the laser 6 months without treatment per oncology -Appreciate palliative care input-plan for SNF placement with hospice follow-up as patient lives alone.  Cirrhosis/prior history of alcohol use -Stable  Hypertension: Normotensive -Continue home meds  Chronic COPD on 2 L nightly: Stable -Continue home oxygen -PRN breathing treatments  IDDM-2 with symptomatic hypoglycemia -Continue SSI  CAD: Stable -Continue home cilostazol and aspirin  CKD-3: Stable  Chronic pain -PRN oxycodone  COVID-19 negative   Scheduled Meds: . allopurinol  100 mg Oral Daily  . aspirin EC  81 mg  Oral Q breakfast  . cilostazol  50 mg Oral BID  . dextrose  1 ampule Intravenous Once  . feeding supplement (ENSURE ENLIVE)  237 mL Oral TID BM  . folic acid  1 mg Oral Daily  . heparin  5,000 Units Subcutaneous Q8H  . insulin aspart  0-5 Units Subcutaneous QHS  . insulin aspart  0-9 Units Subcutaneous TID WC  . metoprolol tartrate  25 mg Oral BID  . pantoprazole  40 mg Oral Daily  . sodium chloride flush  3 mL Intravenous Q12H   Continuous Infusions: . sodium chloride    . dextrose Stopped (01/07/19 0622)   PRN Meds:.albuterol, hydrALAZINE, ondansetron (ZOFRAN) IV, ondansetron, oxyCODONE   DVT prophylaxis: Subcu heparin Code Status: DNR Family Communication: Attempted to call Mrs. Lohn but difficulty understanding each other due to poor connection.  Disposition Plan: Discharge to SNF with hospice when available  Consultants:   Palliative care  Oncology  Procedures:   None  Microbiology: . COVID 19- negative  Antimicrobials: Anti-infectives (From admission, onward)   None       Objective: Vitals:   01/08/19 1634 01/08/19 1951 01/09/19 0158 01/09/19 1145  BP: 124/69 (!) 170/85  (!) 106/55  Pulse: 87 85  82  Resp: 18 16  16   Temp: 98.6 F (37 C) 98.4 F (36.9 C)    TempSrc: Oral Oral    SpO2: 92% 100%    Weight:   52.8 kg   Height:        Intake/Output Summary (Last 24 hours) at 01/09/2019 1434 Last data filed at 01/09/2019 0800 Gross per 24 hour  Intake 3 ml  Output 50 ml  Net -47 ml  Filed Weights   01/07/19 0541 01/08/19 0229 01/09/19 0158  Weight: 54.9 kg 53.3 kg 52.8 kg    Examination:  GENERAL: No acute distress.  Appears well.  HEENT: MMM.  Vision and hearing grossly intact.  LUNGS:  No IWOB.  Fair air movement bilaterally HEART:  RRR. Heart sounds normal.  ABD: Bowel sounds present. Soft. Non tender.  MSK/EXT:  Moves all extremities. No apparent deformity. SKIN: no apparent skin lesion or wound NEURO: Awake, alert.  No gross  deficit.  PSYCH: Flat affect   Data Reviewed: I have independently reviewed following labs and imaging studies  CBC: Recent Labs  Lab 01/06/19 0248  WBC 12.9*  NEUTROABS 9.0*  HGB 8.3*  HCT 28.1*  MCV 82.2  PLT 400   Basic Metabolic Panel: Recent Labs  Lab 01/06/19 0248 01/06/19 1127  NA 135 132*  K 5.8* 4.4  CL 110 107  CO2 15* 18*  GLUCOSE 315* 37*  BUN 24* 22  CREATININE 1.97* 1.71*  CALCIUM 8.3* 7.8*   GFR: Estimated Creatinine Clearance: 20.7 mL/min (A) (by C-G formula based on SCr of 1.71 mg/dL (H)). Liver Function Tests: Recent Labs  Lab 01/06/19 0248  AST 26  ALT 17  ALKPHOS 262*  BILITOT 0.4  PROT 5.2*  ALBUMIN 1.8*   No results for input(s): LIPASE, AMYLASE in the last 168 hours. Recent Labs  Lab 01/06/19 0252  AMMONIA 38*   Coagulation Profile: Recent Labs  Lab 01/06/19 0248  INR 1.3*   Cardiac Enzymes: Recent Labs  Lab 01/06/19 0248 01/06/19 0534  TROPONINI <0.03 <0.03   BNP (last 3 results) No results for input(s): PROBNP in the last 8760 hours. HbA1C: No results for input(s): HGBA1C in the last 72 hours. CBG: Recent Labs  Lab 01/08/19 1149 01/08/19 1614 01/08/19 2120 01/09/19 0658 01/09/19 1143  GLUCAP 259* 111* 81 361* 93   Lipid Profile: No results for input(s): CHOL, HDL, LDLCALC, TRIG, CHOLHDL, LDLDIRECT in the last 72 hours. Thyroid Function Tests: No results for input(s): TSH, T4TOTAL, FREET4, T3FREE, THYROIDAB in the last 72 hours. Anemia Panel: No results for input(s): VITAMINB12, FOLATE, FERRITIN, TIBC, IRON, RETICCTPCT in the last 72 hours. Urine analysis:    Component Value Date/Time   COLORURINE STRAW (A) 11/12/2018 1950   APPEARANCEUR CLEAR 11/12/2018 1950   LABSPEC 1.006 11/12/2018 1950   PHURINE 6.0 11/12/2018 1950   GLUCOSEU NEGATIVE 11/12/2018 1950   HGBUR NEGATIVE 11/12/2018 1950   BILIRUBINUR NEGATIVE 11/12/2018 [place] NEGATIVE 11/12/2018 1950   PROTEINUR NEGATIVE 11/12/2018 1950    UROBILINOGEN 0.2 05/09/2015 2109   NITRITE NEGATIVE 11/12/2018 1950   LEUKOCYTESUR NEGATIVE 11/12/2018 1950   Sepsis Labs: Invalid input(s): PROCALCITONIN, LACTICIDVEN  Recent Results (from the past 240 hour(s))  SARS Coronavirus 2 (CEPHEID - Performed in [place] hospital lab), Hosp Order     Status: None   Collection Time: 01/06/19  4:39 AM  Result Value Ref Range Status   SARS Coronavirus 2 NEGATIVE NEGATIVE Final    Comment: (NOTE) If result is NEGATIVE SARS-CoV-2 target nucleic acids are NOT DETECTED. The SARS-CoV-2 RNA is generally detectable in upper and lower  respiratory specimens during the acute phase of infection. The lowest  concentration of SARS-CoV-2 viral copies this assay can detect is 250  copies / mL. A negative result does not preclude SARS-CoV-2 infection  and should not be used as the sole basis for treatment or other  patient management decisions.  A negative result may occur with  improper specimen collection / handling, submission of specimen other  than nasopharyngeal swab, presence of viral mutation(s) within the  areas targeted by this assay, and inadequate number of viral copies  (<250 copies / mL). A negative result must be combined with clinical  observations, patient history, and epidemiological information. If result is POSITIVE SARS-CoV-2 target nucleic acids are DETECTED. The SARS-CoV-2 RNA is generally detectable in upper and lower  respiratory specimens dur ing the acute phase of infection.  Positive  results are indicative of active infection with SARS-CoV-2.  Clinical  correlation with patient history and other diagnostic information is  necessary to determine patient infection status.  Positive results do  not rule out bacterial infection or co-infection with other viruses. If result is PRESUMPTIVE POSTIVE SARS-CoV-2 nucleic acids MAY BE PRESENT.   A presumptive positive result was obtained on the submitted specimen  and confirmed on  repeat testing.  While 2019 novel coronavirus  (SARS-CoV-2) nucleic acids may be present in the submitted sample  additional confirmatory testing may be necessary for epidemiological  and / or clinical management purposes  to differentiate between  SARS-CoV-2 and other Sarbecovirus currently known to infect humans.  If clinically indicated additional testing with an alternate test  methodology [phone]) is advised. The SARS-CoV-2 RNA is generally  detectable in upper and lower respiratory sp ecimens during the acute  phase of infection. The expected result is Negative. Fact Sheet for Patients:  StrictlyIdeas.no Fact Sheet for Healthcare Providers: BankingDealers.co.za This test is not yet approved or cleared by the [place] FDA and has been authorized for detection and/or diagnosis of SARS-CoV-2 by FDA under an Emergency Use Authorization (EUA).  This EUA will remain in effect (meaning this test can be used) for the duration of the COVID-19 declaration under Section 564(b)(1) of the Act, 21 U.S.C. section 360bbb-3(b)(1), unless the authorization is terminated or revoked sooner. Performed at [facility], [place] [address] [place], [place] [address]       Radiology Studies: No results found.  Taye T. [facility] Pager [phone]  If 7PM-7AM, please contact night-coverage [email or web address] Password [email or web address] 01/09/2019, 2:34 PM

## 2019-01-09 NOTE — TOC Initial Note (Signed)
Transition of Care [facility]) - Initial/Assessment Note    Patient Details  Name: [name] MRN: [id number] Date of Birth: Jun 14, 1946  [facility] CM/SW Contact:    Alberteen Sam, LCSW Phone Number: 01/09/2019, 1:34 PM  Clinical Narrative:                  CSW consulted with patient's nephew Rolan Bucco who reports he would like patient to go to [facility] as her brother is currently there. He reports he believes this would be good for her as they talk and can encourage each other. Rolan Bucco states he also would like to ensure he is point of contact for patient's needs, CSW will relay information to [place] when sending referral. CSW will notify Rolan Bucco when bed offers received.   Expected Discharge Plan: Skilled Nursing Facility Barriers to Discharge: Continued Medical Work up   Patient Goals and CMS Choice Patient states their goals for this hospitalization and ongoing recovery are:: to go home CMS Medicare.gov Compare Post Acute Care list provided to:: Patient Represenative (must comment)(Julius (nephew and point of contact)) Choice offered to / list presented to : Rolan Bucco (nephew))  Expected Discharge Plan and Services Expected Discharge Plan: [place] In-house Referral: NA Discharge Planning Services: NA Post Acute Care Choice: [place] Living arrangements for the past 2 months: Single Family Home                 DME Arranged: N/A DME Agency: NA       HH Arranged: NA HH Agency: NA Date HH Agency Contacted: 01/08/19 Time [place]: 2979 Representative spoke with at [place]: receptionist - a rep from [employer] to call CM  Prior Living Arrangements/Services Living arrangements for the past 2 months: [place] Lives with:: Self Patient language and need for interpreter reviewed:: Yes Do you feel safe going back to the place where you live?: Yes      Need for Family Participation in Patient Care: Yes  (Comment) Care giver support system in place?: Yes (comment) Current home services: (Referral made to [facility] [employer] [facility]) Criminal Activity/Legal Involvement Pertinent to Current Situation/Hospitalization: No - Comment as needed  Activities of Daily Living      Permission Sought/Granted Permission sought to share information with : Case Manager, Customer service manager, Family Supports Permission granted to share information with : Yes, Verbal Permission Granted  Share Information with NAME: Rolan Bucco  Permission granted to share info w AGENCY: SNFs  Permission granted to share info w Relationship: nephew  Permission granted to share info w Contact Information: [phone]  Emotional Assessment Appearance:: Appears stated age Attitude/Demeanor/Rapport: Unable to Assess Affect (typically observed): Unable to Assess Orientation: : Oriented to Self, Oriented to  Time, Oriented to Place, Oriented to Situation Alcohol / Substance Use: Not Applicable Psych Involvement: No (comment)  Admission diagnosis:  Precordial pain [R07.2] Hyperkalemia [E87.5] Liver masses [R16.0] Near syncope [R55] Anemia, unspecified type [D64.9] Chronic kidney disease, unspecified CKD stage [N18.9] Chest pain [R07.9] Patient Active Problem List   Diagnosis Date Noted  . Encounter for hospice care discussion   . Metastatic cancer ([place])   . DNR (do not resuscitate)   . Palliative care encounter   . Chest pain 01/06/2019  . Hyperkalemia   . Malnutrition of moderate degree 11/15/2018  . Type II diabetes mellitus with renal manifestations ([place]) 11/12/2018  . Tobacco abuse 11/12/2018  . Generalized weakness 11/12/2018  . Chronic respiratory failure with hypoxia (HCC)  11/12/2018  . Internal hemorrhoid, bleeding 09/26/2018  . Liver masses 09/26/2018  . Leukocytosis 09/26/2018  . AKI (acute kidney injury) ([place]) 08/28/2018  . Gout 08/10/2015  . Hypoglycemia   . Hypothermia   . UTI (lower urinary tract  infection)   . Humeral head fracture 06/05/2015  . Idiopathic chronic gout of multiple sites without tophus 06/05/2015  . Chronic renal disease, stage III ([place]) 06/05/2015  . Nausea vomiting and diarrhea 05/09/2015  . Increased urinary frequency 05/09/2015  . Sepsis ([place]) 05/09/2015  . Hypokalemia   . Chronic diastolic congestive heart failure ([place])   . Hyperlipidemia 02/10/2015  . Cellulitis 11/04/2014  . Essential hypertension 08/27/2014  . Abdominal pain, acute, bilateral lower quadrant 07/26/2014  . Colitis 07/26/2014  . GI bleeding 06/14/2014  . Bright red blood per rectum 06/14/2014  . Acute encephalopathy 06/14/2014  . Personal history of colonic polyps 08/06/2013  . Knee pain, acute 05/20/2013  . Claudication ([place]) 03/13/2013  . Obesity 03/13/2013  . PVD, LEIA/LIIA and RCIA pta 7/09- ABIs 0.68 and 0.69 Feb 2014 11/17/2012  . PUD (peptic ulcer disease) GI bleed in past (2011) 11/17/2012  . Cirrhosis of liver ([place]) 11/17/2012  . Colon cancer, lap chole 2012 11/17/2012  . 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 Atypical chest pain- no history of CAD. Myoview low risk 2012 11/15/2012  . Controlled diabetes mellitus type 2 with complications ([place]) 62/13/0865  . Acute renal failure superimposed on stage 3 chronic kidney disease ([place]) 06/16/2012  . Incisional hernia 01/09/2012  . Anemia 01/28/2011  . COPD exacerbation ([place]) 01/28/2011  . Sleep apnea 01/28/2011  . Transfusion history 01/28/2011  . Sinus problem 01/28/2011  . Full dentures 01/28/2011   PCP:  Nolene Ebbs, MD Pharmacy:   [facility] [place], [address] [place] [address] Phone: [phone] Fax: [place], [place] [address] Phone: [phone] Fax: [phone]     Social Determinants of Health  (SDOH) Interventions    Readmission Risk Interventions Readmission Risk Prevention Plan 11/15/2018  Transportation Screening Complete  Medication Review ([place]) Complete  PCP or Specialist appointment within 3-5 days of discharge Complete  HRI or [place] Not Complete  HRI or Home Care Consult Pt Refusal Comments N/A  SW Recovery Care/Counseling Consult Complete  Palliative Care Screening Not Complete  Comments N/A  Skilled Nursing Facility Complete  Some recent data might be hidden

## 2019-01-10 DIAGNOSIS — E1122 Type 2 diabetes mellitus with diabetic chronic kidney disease: Secondary | ICD-10-CM

## 2019-01-10 DIAGNOSIS — N183 Chronic kidney disease, stage 3 (moderate): Secondary | ICD-10-CM

## 2019-01-10 LAB — GLUCOSE, CAPILLARY
Glucose-Capillary: 181 mg/dL — ABNORMAL HIGH (ref 70–99)
Glucose-Capillary: 166 mg/dL — ABNORMAL HIGH (ref 70–99)

## 2019-01-10 MED ORDER — ENSURE ENLIVE PO LIQD: 237.0000 mL | Freq: Three times a day (TID) | ORAL | 12 refills | Status: AC

## 2019-01-10 MED ORDER — ADULT MULTIVITAMIN W/MINERALS CH
1.0000 | ORAL_TABLET | Freq: Every day | ORAL | Status: DC
  Administered 2019-01-10: 1 via ORAL
  Filled 2019-01-10: qty 1

## 2019-01-10 MED ORDER — OXYCODONE HCL 5 MG PO TABS: 5.0000 mg | ORAL_TABLET | Freq: Four times a day (QID) | ORAL | 0 refills | Status: AC | PRN

## 2019-01-10 NOTE — Progress Notes (Addendum)
Nutrition Follow-up  DOCUMENTATION CODES:   Not applicable  INTERVENTION:   -MVI with minerals daily -Continue Ensure Enlive po TID, each supplement provides 350 kcal and 20 grams of protein -Hormel Shake TID with meals, each supplement provides 520 kcals and 22 grams protein  NUTRITION DIAGNOSIS:   Increased nutrient needs related to cancer and cancer related treatments as evidenced by estimated needs.  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  5/10- diet downgraded to dysphagia 1 with thin liquids due to pt not having her dentures  Reviewed I/O's: -362 ml x 24 hours and +398 ml since admission  UOP: 362 ml x 24 hours  Pt with variable intake; PO: 0-75%. She is intermittently taking Ensure supplements.   Per palliative care notes, pt and family do not desire aggressive treatment. Plan to d/c to SNF with hospice services once medically stable.   Pt with poor oral intake and would benefit from nutrient dense supplement. One Ensure Enlive supplement provides 350 kcals, 20 grams protein, and 44-45 grams of carbohydrate vs one Glucerna shake supplement, which provides 220 kcals, 10 grams of protein, and 26 grams of carbohydrate. Given pt's hx of DM, RD will continue to monitor PO intake, CBGS, and adjust supplement regimen as appropriate.   Labs reviewed: CBGS: 93-300 (inpatient orders for glycemic control are 0-5 units insulin aspart q HS and 0-9 units  insulin aspart TID with meals).   Diet Order:   Diet Order            DIET - DYS 1 Room service appropriate? Yes; Fluid consistency: Thin  Diet effective now              EDUCATION NEEDS:   No education needs have been identified at this time  Skin:  Skin Assessment: Reviewed RN Assessment  Last BM:  01/09/19  Height:   Ht Readings from Last 1 Encounters:  01/06/19 5' (1.524 m)    Weight:   Wt Readings from Last 1 Encounters:  01/10/19 52.7 kg    Ideal Body Weight:  45.5 kg  BMI:  Body mass index is 22.69 kg/m.  Estimated Nutritional Needs:   Kcal:  1650-1850  Protein:  80-95 grams  Fluid:  > 1.6 L    Kerman Pfost A. Jimmye Norman, RD, LDN, [place] Registered Dietitian II Certified Diabetes Care and Education Specialist Pager: [phone] After hours Pager: [phone]

## 2019-01-10 NOTE — Progress Notes (Signed)
Physical Therapy Treatment Patient Details Name: [name] MRN: [id number] DOB: Mar 19, 1946 Today's Date: 01/10/2019    History of Present Illness Pt is a 73 y.o. female admitted 01/06/19 with sweating and transient R-side chest pain. Of note recent dx of adenocarcinoma in 08/2018 with metastatic lesions. Other PMH includes cirrhosis, DM, CKD, anemia, COPD (2L O2 at night).   PT Comments    Pt continues to be limited by fatigue, not agreeable for OOB mobility with PT this morning. Increased time spent discussing need for mobility; pt reports she wants to remain on caseload. Continue to recommend SNF-level therapies if agreeable to participate; pt reports she is.    Follow Up Recommendations  SNF;Supervision/Assistance - 24 hour     Equipment Recommendations  (TBD)    Recommendations for Other Services       Precautions / Restrictions Precautions Precautions: Fall Restrictions Weight Bearing Restrictions: No    Mobility  Bed Mobility Overal bed mobility: Modified Independent             General bed mobility comments: Mod indep to come to long sitting from raised [facility] with use of bed rail  Transfers                    Ambulation/Gait                 Stairs             Wheelchair Mobility    Modified Rankin (Stroke Patients Only)       Balance                                            Cognition Arousal/Alertness: Awake/alert Behavior During Therapy: Flat affect Overall Cognitive Status: No family/caregiver present to determine baseline cognitive functioning                                 General Comments: Pt will converse appropriately, but then when it comes to deciding whether or not to participate/get out of bed, she does not answer. Difficulty finishing thoughts; poor memory(?). Difficult to reason with. Continues to chuckle in reponse to questions; unsure if masking cognition?       Exercises      General Comments General comments (skin integrity, edema, etc.): Increased time spent discussing current situation/condition and importance of continued mobility; pt inconsistently reporting "I'm not giving up" then "Oh I'm not getting out of this bed" but then stating she wants to stay on PT caseload. Pt with slow, labored speech; unable to get HR/SpO2 reading on pulse ox, RN present and aware      Pertinent Vitals/Pain Pain Assessment: No/denies pain    Home Living                      Prior Function            PT Goals (current goals can now be found in the care plan section) Acute Rehab PT Goals Patient Stated Goal: to get stronger PT Goal Formulation: With patient Time For Goal Achievement: 01/21/19 Potential to Achieve Goals: Fair Progress towards PT goals: Not progressing toward goals - comment(Not agreeable to get out of bed)    Frequency    Min 2X/week      PT Plan Frequency  needs to be updated    Co-evaluation              AM-PAC PT "6 Clicks" Mobility   Outcome Measure  Help needed turning from your back to your side while in a flat bed without using bedrails?: A Little Help needed moving from lying on your back to sitting on the side of a flat bed without using bedrails?: A Little Help needed moving to and from a bed to a chair (including a wheelchair)?: A Little Help needed standing up from a chair using your arms (e.g., wheelchair or bedside chair)?: A Little Help needed to walk in hospital room?: A Little Help needed climbing 3-5 steps with a railing? : A Lot 6 Click Score: 17    End of Session   Activity Tolerance: Patient limited by fatigue Patient left: in bed;with call bell/phone within reach;with bed alarm set;with nursing/sitter in room Nurse Communication: Mobility status PT Visit Diagnosis: Unsteadiness on feet (R26.81);Difficulty in walking, not elsewhere classified (R26.2)     Time: 3291-9166 PT Time  Calculation (min) (ACUTE ONLY): 23 min  Charges:  $Self Care/Home Management: [place], PT, DPT Acute Rehabilitation Services  Pager [phone] Office [place] 01/10/2019, 12:46 PM

## 2019-01-10 NOTE — Progress Notes (Signed)
PROGRESS NOTE  [name] VFI:[id number] DOB: December 03, 1945 DOA: 01/06/2019 PCP: Nolene Ebbs, MD   LOS: 3 days   Patient is from: Home  Brief Narrative / Interim history: 72 year old female with history of cirrhosis, former alcohol use disorder, IDDM-2, CKD, anemia, COPD on 2 L by nasal cannula and metastatic cancer to multiple organs who presented with diaphoresis, trembling, near syncope and right-sided chest pain.  Really did not know her cancer diagnosis.  In ED, hemodynamically stable.  CBC with hemoglobin to 8.3 mild leukocytosis to 12.9 with left shift.  Undetectable troponin.  Ammonia within normal range.  Potassium 5.8.  Creatinine 1.97.  CT head without contrast no acute finding.  CXR without cardiopulmonary finding.  EKG without acute ischemic finding.  Subjective: Events overnight of this morning.  No complaint this morning.  Denies chest pain, dyspnea or abdominal pain.  Poor historian.   Assessment & Plan: Active Problems:   Chest pain   Hyperkalemia   Metastatic cancer ([place])   DNR (do not resuscitate)   Palliative care encounter   Encounter for hospice care discussion  Transient right-sided chest pain: Resolved. -Cardiopulmonary work-up not significant.  Metastatic adenocarcinoma involving lungs, liver and pancreatic tail -Reportedly incurable and poor prognosis about the laser 6 months without treatment per oncology -Appreciate palliative care input-plan for SNF placement with hospice follow-up as patient lives alone.  Cirrhosis/prior history of alcohol use -Stable  Hypertension: Normotensive -Continue home meds  Chronic COPD on 2 L nightly: Stable -Continue home oxygen -PRN breathing treatments  IDDM-2 with symptomatic hypoglycemia -Continue SSI  CAD: Stable -Continue home cilostazol and aspirin  CKD-3: Stable  Chronic pain -PRN oxycodone  COVID-19 negative   Scheduled Meds: . allopurinol  100 mg Oral Daily  . aspirin EC  81 mg Oral Q  breakfast  . cilostazol  50 mg Oral BID  . dextrose  1 ampule Intravenous Once  . feeding supplement (ENSURE ENLIVE)  237 mL Oral TID BM  . folic acid  1 mg Oral Daily  . heparin  5,000 Units Subcutaneous Q8H  . insulin aspart  0-5 Units Subcutaneous QHS  . insulin aspart  0-9 Units Subcutaneous TID WC  . metoprolol tartrate  25 mg Oral BID  . pantoprazole  40 mg Oral Daily  . sodium chloride flush  3 mL Intravenous Q12H   Continuous Infusions: . sodium chloride    . dextrose Stopped (01/07/19 0622)   PRN Meds:.albuterol, hydrALAZINE, ondansetron (ZOFRAN) IV, ondansetron, oxyCODONE   DVT prophylaxis: Subcu heparin Code Status: DNR Family Communication: Updated patient's aunt over the phone.  The only family member around.  Disposition Plan: Medically ready for discharge.  Discharge to SNF with hospice when available  Consultants:   Palliative care  Oncology  Procedures:   None  Microbiology: . COVID 19- negative  Antimicrobials: Anti-infectives (From admission, onward)   None      Objective: Vitals:   01/10/19 0410 01/10/19 1232 01/10/19 1253 01/10/19 1255  BP: 130/70 (!) 170/76  128/60  Pulse: 99 (!) 54 84 94  Resp: 18 18  18   Temp: 98.5 F (36.9 C) (!) 97.4 F (36.3 C)  98.3 F (36.8 C)  TempSrc: Oral Oral  Oral  SpO2: 100% (!) 87% 100% 100%  Weight: 52.7 kg     Height:        Intake/Output Summary (Last 24 hours) at 01/10/2019 1304 Last data filed at 01/09/2019 2316 Gross per 24 hour  Intake -  Output 312 ml  Net -312 ml   Filed Weights   01/08/19 0229 01/09/19 0158 01/10/19 0410  Weight: 53.3 kg 52.8 kg 52.7 kg    Examination:  GENERAL: No acute distress.  Sitting in bed eating breakfast HEENT: MMM.  Vision and hearing grossly intact.  NECK: Supple.  No apparent JVD. LUNGS:  No IWOB.  Fair air movement bilaterally HEART:  RRR.  S1 and S2 audible ABD: Bowel sounds present. Soft.  Diffuse tenderness to palpation.  Periumbilical hernia  MSK/EXT:  Moves all extremities. No apparent deformity. No edema bilaterally.  SKIN: no apparent skin lesion or wound NEURO: Awake, alert and oriented x4- date of months.  No gross deficit. PSYCH: Calm.   Data Reviewed: I have independently reviewed following labs and imaging studies  CBC: Recent Labs  Lab 01/06/19 0248  WBC 12.9*  NEUTROABS 9.0*  HGB 8.3*  HCT 28.1*  MCV 82.2  PLT 983   Basic Metabolic Panel: Recent Labs  Lab 01/06/19 0248 01/06/19 1127  NA 135 132*  K 5.8* 4.4  CL 110 107  CO2 15* 18*  GLUCOSE 315* 37*  BUN 24* 22  CREATININE 1.97* 1.71*  CALCIUM 8.3* 7.8*   GFR: Estimated Creatinine Clearance: 20.7 mL/min (A) (by C-G formula based on SCr of 1.71 mg/dL (H)). Liver Function Tests: Recent Labs  Lab 01/06/19 0248  AST 26  ALT 17  ALKPHOS 262*  BILITOT 0.4  PROT 5.2*  ALBUMIN 1.8*   No results for input(s): LIPASE, AMYLASE in the last 168 hours. Recent Labs  Lab 01/06/19 0252  AMMONIA 38*   Coagulation Profile: Recent Labs  Lab 01/06/19 0248  INR 1.3*   Cardiac Enzymes: Recent Labs  Lab 01/06/19 0248 01/06/19 0534  TROPONINI <0.03 <0.03   BNP (last 3 results) No results for input(s): PROBNP in the last 8760 hours. HbA1C: No results for input(s): HGBA1C in the last 72 hours. CBG: Recent Labs  Lab 01/09/19 1143 01/09/19 1654 01/09/19 2101 01/10/19 0607 01/10/19 1128  GLUCAP 93 122* 300* 181* 166*   Lipid Profile: No results for input(s): CHOL, HDL, LDLCALC, TRIG, CHOLHDL, LDLDIRECT in the last 72 hours. Thyroid Function Tests: No results for input(s): TSH, T4TOTAL, FREET4, T3FREE, THYROIDAB in the last 72 hours. Anemia Panel: No results for input(s): VITAMINB12, FOLATE, FERRITIN, TIBC, IRON, RETICCTPCT in the last 72 hours. Urine analysis:    Component Value Date/Time   COLORURINE STRAW (A) 11/12/2018 1950   APPEARANCEUR CLEAR 11/12/2018 1950   LABSPEC 1.006 11/12/2018 1950   PHURINE 6.0 11/12/2018 1950    GLUCOSEU NEGATIVE 11/12/2018 1950   HGBUR NEGATIVE 11/12/2018 1950   BILIRUBINUR NEGATIVE 11/12/2018 [place] NEGATIVE 11/12/2018 1950   PROTEINUR NEGATIVE 11/12/2018 1950   UROBILINOGEN 0.2 05/09/2015 2109   NITRITE NEGATIVE 11/12/2018 1950   LEUKOCYTESUR NEGATIVE 11/12/2018 1950   Sepsis Labs: Invalid input(s): PROCALCITONIN, LACTICIDVEN  Recent Results (from the past 240 hour(s))  SARS Coronavirus 2 (CEPHEID - Performed in [place] hospital lab), Hosp Order     Status: None   Collection Time: 01/06/19  4:39 AM  Result Value Ref Range Status   SARS Coronavirus 2 NEGATIVE NEGATIVE Final    Comment: (NOTE) If result is NEGATIVE SARS-CoV-2 target nucleic acids are NOT DETECTED. The SARS-CoV-2 RNA is generally detectable in upper and lower  respiratory specimens during the acute phase of infection. The lowest  concentration of SARS-CoV-2 viral copies this assay can detect is 250  copies / mL. A negative result does not preclude  SARS-CoV-2 infection  and should not be used as the sole basis for treatment or other  patient management decisions.  A negative result may occur with  improper specimen collection / handling, submission of specimen other  than nasopharyngeal swab, presence of viral mutation(s) within the  areas targeted by this assay, and inadequate number of viral copies  (<250 copies / mL). A negative result must be combined with clinical  observations, patient history, and epidemiological information. If result is POSITIVE SARS-CoV-2 target nucleic acids are DETECTED. The SARS-CoV-2 RNA is generally detectable in upper and lower  respiratory specimens dur ing the acute phase of infection.  Positive  results are indicative of active infection with SARS-CoV-2.  Clinical  correlation with patient history and other diagnostic information is  necessary to determine patient infection status.  Positive results do  not rule out bacterial infection or co-infection  with other viruses. If result is PRESUMPTIVE POSTIVE SARS-CoV-2 nucleic acids MAY BE PRESENT.   A presumptive positive result was obtained on the submitted specimen  and confirmed on repeat testing.  While 2019 novel coronavirus  (SARS-CoV-2) nucleic acids may be present in the submitted sample  additional confirmatory testing may be necessary for epidemiological  and / or clinical management purposes  to differentiate between  SARS-CoV-2 and other Sarbecovirus currently known to infect humans.  If clinically indicated additional testing with an alternate test  methodology [phone]) is advised. The SARS-CoV-2 RNA is generally  detectable in upper and lower respiratory sp ecimens during the acute  phase of infection. The expected result is Negative. Fact Sheet for Patients:  StrictlyIdeas.no Fact Sheet for Healthcare Providers: BankingDealers.co.za This test is not yet approved or cleared by the [place] FDA and has been authorized for detection and/or diagnosis of SARS-CoV-2 by FDA under an Emergency Use Authorization (EUA).  This EUA will remain in effect (meaning this test can be used) for the duration of the COVID-19 declaration under Section 564(b)(1) of the Act, 21 U.S.C. section 360bbb-3(b)(1), unless the authorization is terminated or revoked sooner. Performed at [facility], [place] [address] [place], [place] [address]       Radiology Studies: No results found.  Kamora Vossler T. [facility] Pager [phone]  If 7PM-7AM, please contact night-coverage [email or web address] Password [email or web address] 01/10/2019, 1:04 PM

## 2019-01-10 NOTE — Care Management Important Message (Signed)
Important Message  Patient Details  Name: [name] MRN: [id number] Date of Birth: 01/13/1946   Medicare Important Message Given:  Yes    Julyanna Scholle [address] 01/10/2019, 2:41 PM

## 2019-01-10 NOTE — Discharge Summary (Signed)
Physician Discharge Summary  [name] RKY:[id number] DOB: 1946-06-10 DOA: 01/06/2019  PCP: Nolene Ebbs, MD  Admit date: 01/06/2019 Discharge date: 01/10/2019  Admitted From: home Disposition: SNF with hospice follow-up.  Recommendations for Outpatient Follow-up:   1. Please follow up on the following pending results: None 2. Hospice to follow-up patient at SNF.  Discharge Condition: Stable but guarded prognosis CODE STATUS: DNR  Hospital Course: 73 year old female with history of cirrhosis, former alcohol use disorder, IDDM-2, CKD, anemia, COPD on 2 L by nasal cannula and metastatic cancer to multiple organs who presented with diaphoresis, trembling, near syncope and right-sided chest pain.  Reportedly did not know her cancer diagnosis.  In ED, hemodynamically stable.  CBC with hemoglobin to 8.3 mild leukocytosis to 12.9 with left shift.  Undetectable troponin.  Ammonia within normal range.  Potassium 5.8.  Creatinine 1.97.  CT head without contrast no acute finding.  CXR without cardiopulmonary finding.  EKG without acute ischemic finding.  Patient was admitted.  CT abdomen and pelvis obtained and revealed possible metastatic adenocarcinoma involving the lungs, liver and pancreas.  Oncology consulted and felt this incurable with grim prognosis less than 6 months.  Oncology also did not feel patient is a candidate for curative chemotherapy but palliative.  Palliative care consulted and suggested hospice follow-up at SNF.  Patient lives alone with no family member to look after her hence disposition to SNF with hospice follow-up.  She may be transitioned to residential hospice down the road.  See individual problem list below for more.  Discharge Diagnoses:  Transient right-sided chest pain: Resolved. -Cardiopulmonary work-up not significant.  Metastatic adenocarcinoma involving lungs, liver and pancreatic tail -Reportedly incurable and poor prognosis , less than 6 months  without treatment but no utility per oncology -Appreciate palliative care input-plan for SNF placement with hospice follow-up as patient lives alone.  Cirrhosis/prior history of alcohol use -Stable  Hypertension: Normotensive -Continue home meds  Chronic COPD on 2 L nightly: Stable -Continue home oxygen -PRN breathing treatments  IDDM-2 with symptomatic hypoglycemia -Continue home insulin -Liberating diet  CAD: Stable -Continue home cilostazol and aspirin -Liberating diet  CKD-3: Stable  Chronic pain -PRN oxycodone.  Rx printed at discharge  COVID-19 negative   Other chronic medical conditions are stable.  Discharged on home medications  Discharge Instructions  Discharge Instructions    Diet general   Complete by:  As directed    Increase activity slowly   Complete by:  As directed      Allergies as of 01/10/2019      Reactions   Compazine [prochlorperazine Edisylate] Other (See Comments)   Altered mental status   Penicillins Hives   Has patient had a PCN reaction causing immediate rash, facial/tongue/throat swelling, SOB or lightheadedness with hypotension: Yes Has patient had a PCN reaction causing severe rash involving mucus membranes or skin necrosis: No Has patient had a PCN reaction that required hospitalization: No Has patient had a PCN reaction occurring within the last 10 years: No If all of the above answers are "NO", then may proceed with Cephalosporin use.   Phenergan [promethazine Hcl] Other (See Comments)   Altered mental status   Lipitor [atorvastatin] Swelling   Body swells, but doesn't affect breathing   Chocolate Diarrhea   Lactose Intolerance (gi) Diarrhea   Other Other (See Comments)   Nuts- Diverticulitis   Pork-derived Products Other (See Comments)   Causes GOUT      Medication List    STOP taking these  medications   hydrocortisone 25 MG suppository Commonly known as:  ANUSOL-HC   nicotine 21 mg/24hr patch Commonly known  as:  NICODERM CQ - dosed in mg/24 hours     TAKE these medications   acetaminophen 325 MG tablet Commonly known as:  TYLENOL Take 2 tablets (650 mg total) by mouth every 6 (six) hours as needed for mild pain, fever or headache (or Fever >/= 101). What changed:    how much to take  when to take this   albuterol 108 (90 Base) MCG/ACT inhaler Commonly known as:  VENTOLIN HFA Inhale 2 puffs into the lungs every 6 (six) hours as needed. For shortness of breath   albuterol (2.5 MG/3ML) 0.083% nebulizer solution Commonly known as:  PROVENTIL Take 2.5 mg by nebulization every 6 (six) hours as needed for wheezing or shortness of breath.   allopurinol 100 MG tablet Commonly known as:  ZYLOPRIM Take 100 mg by mouth daily.   aspirin 81 MG EC tablet Take 1 tablet (81 mg total) by mouth daily with breakfast.   cilostazol 100 MG tablet Commonly known as:  PLETAL Take 50 mg by mouth 2 (two) times daily.   diclofenac sodium 1 % Gel Commonly known as:  VOLTAREN Apply 2 g topically 4 (four) times daily as needed (pain).   ergocalciferol 1.25 MG (50000 UT) capsule Commonly known as:  VITAMIN D2 Take 50,000 Units by mouth every Monday.   feeding supplement (ENSURE ENLIVE) Liqd Take 237 mLs by mouth 3 (three) times daily between meals.   ferrous sulfate 325 (65 FE) MG tablet Take 325 mg by mouth daily with breakfast.   fluticasone 50 MCG/ACT nasal spray Commonly known as:  FLONASE Place 2 sprays into the nose daily as needed for allergies.   folic acid 1 MG tablet Commonly known as:  FOLVITE Take 1 tablet (1 mg total) by mouth daily.   insulin aspart 100 UNIT/ML injection Commonly known as:  novoLOG Inject 0-9 Units into the skin 3 (three) times daily with meals. 0-9 Units, Subcutaneous, 3 times daily with meals, First dose (after last reorder) on Sun 11/18/18 at 1130 Correction coverage: Sensitive (thin, NPO, renal) CBG < 70: implement hypoglycemia protocol CBG 70 - 120: 0 units CBG  121 - 150: 1 unit CBG 151 - 200: 2 units CBG 201 - 250: 3 units CBG 251 - 300: 5 units CBG 301 - 350: 7 units CBG 351 - 400: 9 units CBG > 400: call MD and obtain STAT lab verification   insulin glargine 100 UNIT/ML injection Commonly known as:  LANTUS Inject 0.03 mLs (3 Units total) into the skin daily. What changed:    how much to take  when to take this   magnesium oxide 400 MG tablet Commonly known as:  MAG-OX Take 400 mg by mouth daily.   metoprolol tartrate 50 MG tablet Commonly known as:  LOPRESSOR Take 1 tablet (50 mg total) by mouth 2 (two) times daily.   montelukast 10 MG tablet Commonly known as:  SINGULAIR Take 10 mg by mouth at bedtime.   omeprazole 40 MG capsule Commonly known as:  PRILOSEC Take 40 mg by mouth daily.   oxyCODONE 5 MG immediate release tablet Commonly known as:  Oxy IR/ROXICODONE Take 1 tablet (5 mg total) by mouth every 6 (six) hours as needed for moderate pain or severe pain.   OXYGEN Inhale 2 L into the lungs at bedtime.   simvastatin 10 MG tablet Commonly known as:  ZOCOR  Take 10 mg by mouth daily.   sitaGLIPtin 100 MG tablet Commonly known as:  JANUVIA Take 100 mg by mouth daily.   thiamine 100 MG tablet Take 1 tablet (100 mg total) by mouth daily.   triamcinolone ointment 0.1 % Commonly known as:  KENALOG Apply 1 application topically daily as needed (rash).      Follow-up Information    Nolene Ebbs, MD.   Specialty:  Internal Medicine Contact information: [address] [place] [address] [phone]           Consultations:  Oncology  Palliative  Procedures/Studies:  2D Echo: Not obtained this admission  Ct Abdomen Pelvis Wo Contrast  Result Date: 01/06/2019 CLINICAL DATA:  Occult primary, adenocarcinoma or carcinoma not otherwise specified. EXAM: CT CHEST, ABDOMEN AND PELVIS WITHOUT CONTRAST TECHNIQUE: Multidetector CT imaging of the chest, abdomen and pelvis was performed following the  standard protocol without IV contrast. COMPARISON:  MRI of September 23, 2018.  CT scan of November 30, 2017. FINDINGS: CT CHEST FINDINGS Cardiovascular: Atherosclerosis of thoracic aorta is noted without aneurysm formation. Normal cardiac size. Coronary artery calcifications are noted. No pericardial effusion. Mediastinum/Nodes: Small sliding-type hiatal hernia is noted. Thyroid gland is unremarkable. No adenopathy is noted. Lungs/Pleura: No pneumothorax or pleural effusion is noted. 1.9 cm lobulated mass is noted in superior segment of left lower lobe best seen on image number 47 of series 4. This is concerning for primary malignancy or possibly metastatic disease. 6 mm nodule is seen in right upper lobe best seen on image number 79 of series 4. Right lower lobe opacity is noted concerning for atelectasis or possibly pneumonia. Opacity is noted posteriorly in the right upper lobe concerning for possible pneumonia. Musculoskeletal: No chest wall mass or suspicious bone lesions identified. CT ABDOMEN PELVIS FINDINGS Hepatobiliary: No gallstones or biliary dilatation is noted. Evaluation of the patent parenchyma is limited due to the lack of intravenous contrast. However large ill-defined low density measuring 6.9 x 4.4 cm is noted in the right hepatic lobe which is significantly enlarged compared to prior exam and concerning for metastatic disease or malignancy. Pancreas: Diffuse atrophy is again noted with multiple parenchymal calcifications. Pancreatic ductal dilatation is again noted. Spleen: Normal in size without focal abnormality. Adrenals/Urinary Tract: Adrenal glands appear normal. Bilateral renal cysts are noted. Mild perinephric stranding is noted. No hydronephrosis or renal obstruction is noted. No renal or ureteral calculi are noted. Urinary bladder is unremarkable. Stomach/Bowel: Status post right hemicolectomy. Sigmoid diverticulosis is noted without inflammation. 3.0 x 2.5 cm rounded soft tissue density is  seen between the pancreatic tail and stomach concerning for possible neoplasm or malignancy, best seen on image number 133 of series 5. There is no evidence of bowel obstruction or inflammation. Vascular/Lymphatic: Atherosclerosis of thoracic aorta is noted without aneurysm formation. Enlarged collateral veins are again noted in left retroperitoneal region. These appear to drain into dilated left ovarian vein. Reproductive: Status post hysterectomy. No adnexal masses. Other: No abdominal wall hernia or abnormality. No abdominopelvic ascites. Musculoskeletal: No acute or significant osseous findings. IMPRESSION: 1.9 cm nodule seen in superior segment of left lower lobe concerning for primary malignancy or metastatic disease. Also noted is 6 mm nodule in right upper lobe concerning for metastatic disease. Peripheral opacity is noted posteriorly in the right upper lobe concerning for pneumonia, although underlying neoplasm or malignancy cannot be excluded. Continued radiographic follow-up is recommended. Ill-defined low density measuring 6.9 x 4.4 cm is noted in right hepatic lobe which is  significantly enlarged compared to prior exam, concerning for metastatic disease or malignancy. Evaluation of the liver is limited due the lack of intravenous contrast. There is interval development of possible 3 cm rounded soft tissue density between the pancreatic tail and stomach concerning for possible neoplasm or malignancy. Further evaluation with MRI is recommended. Diffuse pancreatic atrophy is again noted with multiple parenchymal calcifications. Aortic Atherosclerosis (ICD10-I70.0). Electronically Signed   By: Marijo Conception M.D.   On: 01/06/2019 08:58   Dg Chest 2 View  Result Date: 01/06/2019 CLINICAL DATA:  73 year old female with dizziness and chest pain. EXAM: CHEST - 2 VIEW COMPARISON:  Chest radiograph dated 11/12/2018 FINDINGS: There is emphysematous changes of the lungs. No focal consolidation, pleural  effusion, or pneumothorax. The cardiac silhouette is within normal limits. No acute osseous pathology. Osteopenia. IMPRESSION: No active cardiopulmonary disease. Electronically Signed   By: Anner Crete M.D.   On: 01/06/2019 03:27   Ct Head Wo Contrast  Result Date: 01/06/2019 CLINICAL DATA:  73 year old female with dizziness. EXAM: CT HEAD WITHOUT CONTRAST TECHNIQUE: Contiguous axial images were obtained from the base of the skull through the vertex without intravenous contrast. COMPARISON:  Head CT dated 11/12/2018 FINDINGS: Brain: There is mild age-related atrophy and chronic microvascular ischemic changes. There is no acute intracranial hemorrhage. No mass effect or midline shift. No extra-axial fluid collection. Vascular: No hyperdense vessel or unexpected calcification. Skull: No acute calvarial pathology. Diffuse tiny calvarial lucencies with a ?salt and pepper appearance? which may be related to underlying metabolic disease or hyperparathyroidism. Sinuses/Orbits: Chronic mucoperiosteal thickening and remodeling of the right maxillary sinus. No air-fluid level. The remainder of the visualized paranasal sinuses and mastoid air cells are clear. Other: None IMPRESSION: 1. No acute intracranial hemorrhage. 2. Age-related atrophy and chronic microvascular ischemic changes. Electronically Signed   By: Anner Crete M.D.   On: 01/06/2019 03:49   Ct Chest Wo Contrast  Result Date: 01/06/2019 CLINICAL DATA:  Occult primary, adenocarcinoma or carcinoma not otherwise specified. EXAM: CT CHEST, ABDOMEN AND PELVIS WITHOUT CONTRAST TECHNIQUE: Multidetector CT imaging of the chest, abdomen and pelvis was performed following the standard protocol without IV contrast. COMPARISON:  MRI of September 23, 2018.  CT scan of November 30, 2017. FINDINGS: CT CHEST FINDINGS Cardiovascular: Atherosclerosis of thoracic aorta is noted without aneurysm formation. Normal cardiac size. Coronary artery calcifications are noted. No  pericardial effusion. Mediastinum/Nodes: Small sliding-type hiatal hernia is noted. Thyroid gland is unremarkable. No adenopathy is noted. Lungs/Pleura: No pneumothorax or pleural effusion is noted. 1.9 cm lobulated mass is noted in superior segment of left lower lobe best seen on image number 47 of series 4. This is concerning for primary malignancy or possibly metastatic disease. 6 mm nodule is seen in right upper lobe best seen on image number 79 of series 4. Right lower lobe opacity is noted concerning for atelectasis or possibly pneumonia. Opacity is noted posteriorly in the right upper lobe concerning for possible pneumonia. Musculoskeletal: No chest wall mass or suspicious bone lesions identified. CT ABDOMEN PELVIS FINDINGS Hepatobiliary: No gallstones or biliary dilatation is noted. Evaluation of the patent parenchyma is limited due to the lack of intravenous contrast. However large ill-defined low density measuring 6.9 x 4.4 cm is noted in the right hepatic lobe which is significantly enlarged compared to prior exam and concerning for metastatic disease or malignancy. Pancreas: Diffuse atrophy is again noted with multiple parenchymal calcifications. Pancreatic ductal dilatation is again noted. Spleen: Normal in size without focal abnormality.  Adrenals/Urinary Tract: Adrenal glands appear normal. Bilateral renal cysts are noted. Mild perinephric stranding is noted. No hydronephrosis or renal obstruction is noted. No renal or ureteral calculi are noted. Urinary bladder is unremarkable. Stomach/Bowel: Status post right hemicolectomy. Sigmoid diverticulosis is noted without inflammation. 3.0 x 2.5 cm rounded soft tissue density is seen between the pancreatic tail and stomach concerning for possible neoplasm or malignancy, best seen on image number 133 of series 5. There is no evidence of bowel obstruction or inflammation. Vascular/Lymphatic: Atherosclerosis of thoracic aorta is noted without aneurysm formation.  Enlarged collateral veins are again noted in left retroperitoneal region. These appear to drain into dilated left ovarian vein. Reproductive: Status post hysterectomy. No adnexal masses. Other: No abdominal wall hernia or abnormality. No abdominopelvic ascites. Musculoskeletal: No acute or significant osseous findings. IMPRESSION: 1.9 cm nodule seen in superior segment of left lower lobe concerning for primary malignancy or metastatic disease. Also noted is 6 mm nodule in right upper lobe concerning for metastatic disease. Peripheral opacity is noted posteriorly in the right upper lobe concerning for pneumonia, although underlying neoplasm or malignancy cannot be excluded. Continued radiographic follow-up is recommended. Ill-defined low density measuring 6.9 x 4.4 cm is noted in right hepatic lobe which is significantly enlarged compared to prior exam, concerning for metastatic disease or malignancy. Evaluation of the liver is limited due the lack of intravenous contrast. There is interval development of possible 3 cm rounded soft tissue density between the pancreatic tail and stomach concerning for possible neoplasm or malignancy. Further evaluation with MRI is recommended. Diffuse pancreatic atrophy is again noted with multiple parenchymal calcifications. Aortic Atherosclerosis (ICD10-I70.0). Electronically Signed   By: Marijo Conception M.D.   On: 01/06/2019 08:58      Subjective: No major events overnight of this morning.  No complaint this morning.  Denies chest pain, dyspnea or abdominal pain.  Patient is poor historian with limited comprehension of her condition.  Discharge Exam: Vitals:   01/10/19 1253 01/10/19 1255  BP:  128/60  Pulse: 84 94  Resp:  18  Temp:  98.3 F (36.8 C)  SpO2: 100% 100%    GENERAL: No acute distress.  Sitting in bed eating breakfast HEENT: MMM.  Vision and hearing grossly intact.  NECK: Supple.  No apparent JVD. LUNGS:  No IWOB.  Fair air movement  bilaterally HEART:  RRR.  S1 and S2 audible ABD: Bowel sounds present. Soft.  Diffuse tenderness to palpation.  Periumbilical hernia MSK/EXT:  Moves all extremities. No apparent deformity. No edema bilaterally.  SKIN: no apparent skin lesion or wound NEURO: Awake, alert and oriented x4- date of months.  No gross deficit. PSYCH: Calm.   The results of significant diagnostics from this hospitalization (including imaging, microbiology, ancillary and laboratory) are listed below for reference.     Microbiology: Recent Results (from the past 240 hour(s))  SARS Coronavirus 2 (CEPHEID - Performed in [place] hospital lab), Hosp Order     Status: None   Collection Time: 01/06/19  4:39 AM  Result Value Ref Range Status   SARS Coronavirus 2 NEGATIVE NEGATIVE Final    Comment: (NOTE) If result is NEGATIVE SARS-CoV-2 target nucleic acids are NOT DETECTED. The SARS-CoV-2 RNA is generally detectable in upper and lower  respiratory specimens during the acute phase of infection. The lowest  concentration of SARS-CoV-2 viral copies this assay can detect is 250  copies / mL. A negative result does not preclude SARS-CoV-2 infection  and should not be  used as the sole basis for treatment or other  patient management decisions.  A negative result may occur with  improper specimen collection / handling, submission of specimen other  than nasopharyngeal swab, presence of viral mutation(s) within the  areas targeted by this assay, and inadequate number of viral copies  (<250 copies / mL). A negative result must be combined with clinical  observations, patient history, and epidemiological information. If result is POSITIVE SARS-CoV-2 target nucleic acids are DETECTED. The SARS-CoV-2 RNA is generally detectable in upper and lower  respiratory specimens dur ing the acute phase of infection.  Positive  results are indicative of active infection with SARS-CoV-2.  Clinical  correlation with patient history  and other diagnostic information is  necessary to determine patient infection status.  Positive results do  not rule out bacterial infection or co-infection with other viruses. If result is PRESUMPTIVE POSTIVE SARS-CoV-2 nucleic acids MAY BE PRESENT.   A presumptive positive result was obtained on the submitted specimen  and confirmed on repeat testing.  While 2019 novel coronavirus  (SARS-CoV-2) nucleic acids may be present in the submitted sample  additional confirmatory testing may be necessary for epidemiological  and / or clinical management purposes  to differentiate between  SARS-CoV-2 and other Sarbecovirus currently known to infect humans.  If clinically indicated additional testing with an alternate test  methodology [phone]) is advised. The SARS-CoV-2 RNA is generally  detectable in upper and lower respiratory sp ecimens during the acute  phase of infection. The expected result is Negative. Fact Sheet for Patients:  StrictlyIdeas.no Fact Sheet for Healthcare Providers: BankingDealers.co.za This test is not yet approved or cleared by the [place] FDA and has been authorized for detection and/or diagnosis of SARS-CoV-2 by FDA under an Emergency Use Authorization (EUA).  This EUA will remain in effect (meaning this test can be used) for the duration of the COVID-19 declaration under Section 564(b)(1) of the Act, 21 U.S.C. section 360bbb-3(b)(1), unless the authorization is terminated or revoked sooner. Performed at [facility], [place] [address], [place], [place] [address]      Labs: BNP (last 3 results) Recent Labs    11/12/18 2339  BNP 347.4*   Basic Metabolic Panel: Recent Labs  Lab 01/06/19 0248 01/06/19 1127  NA 135 132*  K 5.8* 4.4  CL 110 107  CO2 15* 18*  GLUCOSE 315* 37*  BUN 24* 22  CREATININE 1.97* 1.71*  CALCIUM 8.3* 7.8*   Liver Function Tests: Recent Labs  Lab 01/06/19 0248  AST 26   ALT 17  ALKPHOS 262*  BILITOT 0.4  PROT 5.2*  ALBUMIN 1.8*   No results for input(s): LIPASE, AMYLASE in the last 168 hours. Recent Labs  Lab 01/06/19 0252  AMMONIA 38*   CBC: Recent Labs  Lab 01/06/19 0248  WBC 12.9*  NEUTROABS 9.0*  HGB 8.3*  HCT 28.1*  MCV 82.2  PLT 340   Cardiac Enzymes: Recent Labs  Lab 01/06/19 0248 01/06/19 0534  TROPONINI <0.03 <0.03   BNP: Invalid input(s): POCBNP CBG: Recent Labs  Lab 01/09/19 1143 01/09/19 1654 01/09/19 2101 01/10/19 0607 01/10/19 1128  GLUCAP 93 122* 300* 181* 166*   D-Dimer No results for input(s): DDIMER in the last 72 hours. Hgb A1c No results for input(s): HGBA1C in the last 72 hours. Lipid Profile No results for input(s): CHOL, HDL, LDLCALC, TRIG, CHOLHDL, LDLDIRECT in the last 72 hours. Thyroid function studies No results for input(s): TSH, T4TOTAL, T3FREE, THYROIDAB in  the last 72 hours.  Invalid input(s): FREET3 Anemia work up No results for input(s): VITAMINB12, FOLATE, FERRITIN, TIBC, IRON, RETICCTPCT in the last 72 hours. Urinalysis    Component Value Date/Time   COLORURINE STRAW (A) 11/12/2018 1950   APPEARANCEUR CLEAR 11/12/2018 1950   LABSPEC 1.006 11/12/2018 [place] 6.0 11/12/2018 1950   GLUCOSEU NEGATIVE 11/12/2018 1950   HGBUR NEGATIVE 11/12/2018 1950   BILIRUBINUR NEGATIVE 11/12/2018 [place] NEGATIVE 11/12/2018 1950   PROTEINUR NEGATIVE 11/12/2018 1950   UROBILINOGEN 0.2 05/09/2015 2109   NITRITE NEGATIVE 11/12/2018 1950   LEUKOCYTESUR NEGATIVE 11/12/2018 1950   Sepsis Labs Invalid input(s): PROCALCITONIN,  WBC,  LACTICIDVEN   Time coordinating discharge: 35 minutes  SIGNED:  Mercy Riding, MD  [facility] 01/10/2019, 2:41 PM Pager [phone]  If 7PM-7AM, please contact night-coverage [email or web address] Password [email or web address]

## 2019-01-10 NOTE — TOC Transition Note (Signed)
Transition of Care [facility]) - CM/SW Discharge Note   Patient Details  Name: RONEKA GILPIN MRN: [id number] Date of Birth: March 11, 1946  [facility] of [facility]) CM/SW Contact:  Alberteen Sam, LCSW Phone Number: 01/10/2019, 2:59 PM   Clinical Narrative:     Patient will DC to: [facility] Anticipated DC date: 01/10/2019 Family notified: Rolan Bucco Transport by: Corey Harold  Per MD patient ready for DC to [facility]. RN, patient, patient's family, and facility notified of DC. Discharge Summary sent to facility. RN given number for report [phone]. DC packet on chart. Ambulance transport requested for patient.  CSW signing off.  [place], [place]   Final next level of care: Skilled Nursing Facility Barriers to Discharge: No Barriers Identified   Patient Goals and CMS Choice Patient states their goals for this hospitalization and ongoing recovery are:: to go to [facility] for rehab where her brother is CMS Medicare.gov Compare Post Acute Care list provided to:: Patient Represenative (must comment)(nephew Rolan Bucco) Choice offered to / list presented to : (nephew Rolan Bucco)  Discharge Placement PASRR number recieved: 01/09/19            Patient chooses bed at: [facility] Patient to be transferred to facility by: [place] Name of family member notified: Rolan Bucco Patient and family notified of of transfer: 01/10/19  Discharge Plan and Services In-house Referral: NA Discharge Planning Services: NA Post Acute Care Choice: [place]          DME Arranged: N/A DME Agency: NA       HH Arranged: NA HH Agency: NA Date HH Agency Contacted: 01/08/19 Time [place]: [id number] Representative spoke with at [place]: receptionist - a rep from [place] to call CM  Social Determinants of Health (SDOH) Interventions     Readmission Risk Interventions Readmission Risk Prevention Plan 11/15/2018  Transportation Screening Complete  Medication Review Press [employer]) Complete   PCP or Specialist appointment within 3-5 days of discharge Complete  HRI or [place] Not Complete  HRI or Home Care Consult Pt Refusal Comments N/A  SW Recovery Care/Counseling Consult Complete  Palliative Care Screening Not Complete  Comments N/A  Skilled Nursing Facility Complete  Some recent data might be hidden

## 2019-01-24 ENCOUNTER — Other Ambulatory Visit: Payer: Self-pay

## 2019-01-24 ENCOUNTER — Emergency Department (HOSPITAL_COMMUNITY)
Admission: EM | Admit: 2019-01-24 | Discharge: 2019-01-24 | Disposition: A | Discharge status: Home / Self Care | Payer: Medicare Other | Attending: Emergency Medicine | Admitting: Emergency Medicine

## 2019-01-24 ENCOUNTER — Encounter (HOSPITAL_COMMUNITY): Payer: Self-pay | Admitting: Emergency Medicine

## 2019-01-24 DIAGNOSIS — I5032 Chronic diastolic (congestive) heart failure: Secondary | ICD-10-CM | POA: Diagnosis not present

## 2019-01-24 DIAGNOSIS — C787 Secondary malignant neoplasm of liver and intrahepatic bile duct: Secondary | ICD-10-CM | POA: Diagnosis not present

## 2019-01-24 DIAGNOSIS — N183 Chronic kidney disease, stage 3 (moderate): Secondary | ICD-10-CM | POA: Insufficient documentation

## 2019-01-24 DIAGNOSIS — C3490 Malignant neoplasm of unspecified part of unspecified bronchus or lung: Secondary | ICD-10-CM | POA: Diagnosis not present

## 2019-01-24 DIAGNOSIS — Z794 Long term (current) use of insulin: Secondary | ICD-10-CM | POA: Insufficient documentation

## 2019-01-24 DIAGNOSIS — F17210 Nicotine dependence, cigarettes, uncomplicated: Secondary | ICD-10-CM | POA: Diagnosis not present

## 2019-01-24 DIAGNOSIS — R799 Abnormal finding of blood chemistry, unspecified: Secondary | ICD-10-CM | POA: Diagnosis present

## 2019-01-24 DIAGNOSIS — E1122 Type 2 diabetes mellitus with diabetic chronic kidney disease: Secondary | ICD-10-CM | POA: Insufficient documentation

## 2019-01-24 DIAGNOSIS — Z7982 Long term (current) use of aspirin: Secondary | ICD-10-CM | POA: Insufficient documentation

## 2019-01-24 DIAGNOSIS — J45909 Unspecified asthma, uncomplicated: Secondary | ICD-10-CM | POA: Diagnosis not present

## 2019-01-24 DIAGNOSIS — J449 Chronic obstructive pulmonary disease, unspecified: Secondary | ICD-10-CM | POA: Diagnosis not present

## 2019-01-24 DIAGNOSIS — I130 Hypertensive heart and chronic kidney disease with heart failure and stage 1 through stage 4 chronic kidney disease, or unspecified chronic kidney disease: Secondary | ICD-10-CM | POA: Insufficient documentation

## 2019-01-24 DIAGNOSIS — C7889 Secondary malignant neoplasm of other digestive organs: Secondary | ICD-10-CM | POA: Insufficient documentation

## 2019-01-24 DIAGNOSIS — E875 Hyperkalemia: Secondary | ICD-10-CM | POA: Diagnosis not present

## 2019-01-24 LAB — CBC WITH DIFFERENTIAL/PLATELET
Abs Immature Granulocytes: 0.05 10*3/uL (ref 0.00–0.07)
Basophils Absolute: 0 10*3/uL (ref 0.0–0.1)
Basophils Relative: 0 %
Eosinophils Absolute: 0.1 10*3/uL (ref 0.0–0.5)
Eosinophils Relative: 1 %
HCT: 28.8 % — ABNORMAL LOW (ref 36.0–46.0)
Hemoglobin: 8.7 g/dL — ABNORMAL LOW (ref 12.0–15.0)
Immature Granulocytes: 0 %
Lymphocytes Relative: 26 %
Lymphs Abs: 3.1 10*3/uL (ref 0.7–4.0)
MCH: 24.5 pg — ABNORMAL LOW (ref 26.0–34.0)
MCHC: 30.2 g/dL (ref 30.0–36.0)
MCV: 81.1 fL (ref 80.0–100.0)
Monocytes Absolute: 1.3 10*3/uL — ABNORMAL HIGH (ref 0.1–1.0)
Monocytes Relative: 11 %
Neutro Abs: 7.4 10*3/uL (ref 1.7–7.7)
Neutrophils Relative %: 62 %
Platelets: 533 10*3/uL — ABNORMAL HIGH (ref 150–400)
RBC: 3.55 MIL/uL — ABNORMAL LOW (ref 3.87–5.11)
RDW: 17.6 % — ABNORMAL HIGH (ref 11.5–15.5)
WBC: 11.9 10*3/uL — ABNORMAL HIGH (ref 4.0–10.5)
nRBC: 0 % (ref 0.0–0.2)

## 2019-01-24 LAB — BASIC METABOLIC PANEL
Anion gap: 8 (ref 5–15)
BUN: 21 mg/dL (ref 8–23)
CO2: 20 mmol/L — ABNORMAL LOW (ref 22–32)
Calcium: 8.7 mg/dL — ABNORMAL LOW (ref 8.9–10.3)
Chloride: 107 mmol/L (ref 98–111)
Creatinine, Ser: 1.67 mg/dL — ABNORMAL HIGH (ref 0.44–1.00)
GFR calc Af Amer: 35 mL/min — ABNORMAL LOW (ref >60)
GFR calc non Af Amer: 30 mL/min — ABNORMAL LOW (ref >60)
Glucose, Bld: 96 mg/dL (ref 70–99)
Potassium: 5.8 mmol/L — ABNORMAL HIGH (ref 3.5–5.1)
Sodium: 135 mmol/L (ref 135–145)

## 2019-01-24 LAB — COMPREHENSIVE METABOLIC PANEL
ALT: 18 U/L (ref 0–44)
AST: 35 U/L (ref 15–41)
Albumin: 1.9 g/dL — ABNORMAL LOW (ref 3.5–5.0)
Alkaline Phosphatase: 280 U/L — ABNORMAL HIGH (ref 38–126)
Anion gap: 7 (ref 5–15)
BUN: 20 mg/dL (ref 8–23)
CO2: 20 mmol/L — ABNORMAL LOW (ref 22–32)
Calcium: 8.8 mg/dL — ABNORMAL LOW (ref 8.9–10.3)
Chloride: 108 mmol/L (ref 98–111)
Creatinine, Ser: 1.72 mg/dL — ABNORMAL HIGH (ref 0.44–1.00)
GFR calc Af Amer: 33 mL/min — ABNORMAL LOW (ref >60)
GFR calc non Af Amer: 29 mL/min — ABNORMAL LOW (ref >60)
Glucose, Bld: 112 mg/dL — ABNORMAL HIGH (ref 70–99)
Potassium: 5.9 mmol/L — ABNORMAL HIGH (ref 3.5–5.1)
Sodium: 135 mmol/L (ref 135–145)
Total Bilirubin: 0.5 mg/dL (ref 0.3–1.2)
Total Protein: 6.2 g/dL — ABNORMAL LOW (ref 6.5–8.1)

## 2019-01-24 LAB — POCT I-STAT EG7
Acid-base deficit: 3 mmol/L — ABNORMAL HIGH (ref 0.0–2.0)
Bicarbonate: 22.1 mmol/L (ref 20.0–28.0)
Calcium, Ion: 1.18 mmol/L (ref 1.15–1.40)
HCT: 24 % — ABNORMAL LOW (ref 36.0–46.0)
Hemoglobin: 8.2 g/dL — ABNORMAL LOW (ref 12.0–15.0)
O2 Saturation: 73 %
Potassium: 6.2 mmol/L — ABNORMAL HIGH (ref 3.5–5.1)
Sodium: 136 mmol/L (ref 135–145)
TCO2: 23 mmol/L (ref 22–32)
pCO2, Ven: 36.4 mmHg — ABNORMAL LOW (ref 44.0–60.0)
pH, Ven: 7.391 (ref 7.250–7.430)
pO2, Ven: 39 mmHg (ref 32.0–45.0)

## 2019-01-24 MED ORDER — METOPROLOL TARTRATE 25 MG PO TABS
50.0000 mg | ORAL_TABLET | Freq: Once | ORAL | Status: AC
  Administered 2019-01-24: 50 mg via ORAL
  Filled 2019-01-24: qty 2

## 2019-01-24 MED ORDER — PHENOL 1.4 % MT LIQD
1.0000 | OROMUCOSAL | Status: DC | PRN
  Administered 2019-01-24: 1 via OROMUCOSAL
  Filled 2019-01-24: qty 177

## 2019-01-24 MED ORDER — SODIUM ZIRCONIUM CYCLOSILICATE 5 G PO PACK: 5.0000 g | PACK | Freq: Every day | ORAL | 1 refills | Status: AC

## 2019-01-24 NOTE — ED Triage Notes (Signed)
Pt from [facility] arrives via EMS. Sent here for hgb 6.5, pt denies any symptoms. BP 176/100, HR 76, resp 16, CBG 170, temp 97.9 Pt has hx of anemia requiring blood transfusions. Denies bloody stools, vomiting blood etc

## 2019-01-24 NOTE — ED Notes (Signed)
All appropriate discharge materials reviewed at length with patient. Time for questions provided. Pt has no other questions at this time and verbalizes understanding of all provided materials.  

## 2019-01-24 NOTE — ED Provider Notes (Signed)
[place] EMERGENCY DEPARTMENT Provider Note   CSN: [id number] Arrival date & time: 01/24/19  3762    History   Chief Complaint Chief Complaint  Patient presents with   Abnormal Lab    HPI [name] is a 73 y.o. female.     HPI 73 year old female with history of cirrhosis, former alcohol use disorder, insulin-dependent diabetes, CKD, anemia, COPD on 2 L nasal cannula at baseline, and metastatic cancer presents from her nursing facility due to a low hemoglobin.  Patient hemoglobin of 6.5.  She has a history of anemia that required blood transfusions in the past.  Denies hematochezia, melena, or hematemesis.  Patient is poor insight into her illness and prognosis.  She states that she is just now learning that she has cancer although has been documented in previous notes that she was made aware.  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   Chronic diastolic congestive heart failure (HCC)    Chronic obstructive pulmonary disease (COPD) ([place])    Colon polyps    Depression    Diabetes mellitus    insulin dep    Gastroesophageal reflux    GI bleeding 08/28/2018   Glaucoma    left   Gout    Hepatitis    alcoholic hepatitis   History of GI diverticular bleed    Hypertension    Leg swelling    On home oxygen therapy    "2L just at night" (08/28/2018)   Pneumonia    history of   PVD (peripheral vascular disease) ([place]) 2009   bilat iliac stenting   Reflux esophagitis    Villous adenoma of colon 05/10/11    Patient Active  Problem List   Diagnosis Date Noted   Encounter for hospice care discussion    Metastatic cancer [facility])    DNR (do not resuscitate)    Palliative care encounter    Chest pain 01/06/2019   Hyperkalemia    Malnutrition of moderate degree 11/15/2018   Type II diabetes mellitus with renal manifestations ([place]) 11/12/2018   Tobacco abuse 11/12/2018   Generalized weakness 11/12/2018   Chronic respiratory failure with hypoxia ([place]) 11/12/2018   Internal hemorrhoid, bleeding 09/26/2018   Liver masses 09/26/2018   Leukocytosis 09/26/2018   AKI (acute kidney injury) ([place]) 08/28/2018   Gout 08/10/2015   Hypoglycemia    Hypothermia    UTI (lower urinary tract infection)    Humeral head fracture 06/05/2015   Idiopathic chronic gout of multiple sites without tophus 06/05/2015   Chronic renal disease, stage III ([place]) 06/05/2015   Nausea vomiting and diarrhea 05/09/2015   Increased urinary frequency 05/09/2015   Sepsis ([place]) 05/09/2015   Hypokalemia    Chronic diastolic congestive heart failure ([place])    Hyperlipidemia 02/10/2015   Cellulitis 11/04/2014   Essential hypertension 08/27/2014   Abdominal pain, acute, bilateral lower quadrant 07/26/2014   Colitis 07/26/2014   GI bleeding 06/14/2014   Bright red blood per rectum 06/14/2014   Acute encephalopathy 06/14/2014  Personal history of colonic polyps 08/06/2013   Knee pain, acute 05/20/2013   Claudication ([place]) 03/13/2013   Obesity 03/13/2013   PVD, LEIA/LIIA and RCIA pta 7/09- ABIs 0.68 and 0.69 Feb 2014 11/17/2012   PUD (peptic ulcer disease) GI bleed in past (2011) 11/17/2012   Cirrhosis of liver ([place]) 11/17/2012   Colon cancer, lap chole 2012 11/17/2012   DJD, multiple compression fractures noted on CXR 11/17/2012   Atypical chest pain- no history of CAD. Myoview low risk 2012 11/15/2012   Controlled diabetes mellitus type 2 with complications ([place]) 81/85/6314   Acute renal failure  superimposed on stage 3 chronic kidney disease ([place]) 06/16/2012   Incisional hernia 01/09/2012   Anemia 01/28/2011   COPD exacerbation ([place]) 01/28/2011   Sleep apnea 01/28/2011   Transfusion history 01/28/2011   Sinus problem 01/28/2011   Full dentures 01/28/2011    Past Surgical History:  Procedure Laterality Date   ABDOMINAL HYSTERECTOMY  yrs ago   BIOPSY  08/31/2018   Procedure: BIOPSY;  Surgeon: [name], MD;  Location: [place];  Service: Gastroenterology;;   CATARACT EXTRACTION Right yrs ago   COLONOSCOPY     COLONOSCOPY WITH PROPOFOL N/A 08/06/2013   Procedure: COLONOSCOPY WITH PROPOFOL;  Surgeon: Lear Ng, MD;  Location: [facility];  Service: Endoscopy;  Laterality: N/A;   COLONOSCOPY WITH PROPOFOL N/A 08/31/2018   Procedure: COLONOSCOPY WITH PROPOFOL;  Surgeon: [name], MD;  Location: [place];  Service: Gastroenterology;  Laterality: N/A;   ESOPHAGOGASTRODUODENOSCOPY (EGD) WITH PROPOFOL N/A 08/06/2013   Procedure: ESOPHAGOGASTRODUODENOSCOPY (EGD) WITH PROPOFOL;  Surgeon: Lear Ng, MD;  Location: [facility];  Service: Endoscopy;  Laterality: N/A;   ESOPHAGOGASTRODUODENOSCOPY (EGD) WITH PROPOFOL N/A 08/30/2018   Procedure: ESOPHAGOGASTRODUODENOSCOPY (EGD) WITH PROPOFOL;  Surgeon: [name], MD;  Location: [place];  Service: Gastroenterology;  Laterality: N/A;   LAPAROSCOPIC ASSISSTED TOTAL COLECTOMY W/ J-POUCH  04/22/11   VENTRAL HERNIA REPAIR  06/13/2012   Procedure: LAPAROSCOPIC VENTRAL HERNIA;  Surgeon: Adin Hector, MD;  Location: [place];  Service: General;  Laterality: N/A;  Laparoscopic Assisted Ventral Hernia with Mesh     OB History   No obstetric history on file.      Home Medications    Prior to Admission medications   Medication Sig Start Date End Date Taking? Authorizing Provider  acetaminophen (TYLENOL) 325 MG tablet Take 2 tablets (650 mg total) by mouth every 6 (six) hours as needed for mild  pain, fever or headache (or Fever >/= 101). Patient taking differently: Take 325 mg by mouth daily.  09/01/18   Roxan Hockey, MD  albuterol (PROVENTIL HFA;VENTOLIN HFA) 108 (90 BASE) MCG/ACT inhaler Inhale 2 puffs into the lungs every 6 (six) hours as needed. For shortness of breath    [provider]  albuterol (PROVENTIL) (2.5 MG/3ML) 0.083% nebulizer solution Take 2.5 mg by nebulization every 6 (six) hours as needed for wheezing or shortness of breath.    [provider]  allopurinol (ZYLOPRIM) 100 MG tablet Take 100 mg by mouth daily.  01/18/16   [provider]  aspirin 81 MG EC tablet Take 1 tablet (81 mg total) by mouth daily with breakfast. 09/01/18   Roxan Hockey, MD  cilostazol (PLETAL) 100 MG tablet Take 50 mg by mouth 2 (two) times daily. 08/07/18   [provider]  diclofenac sodium (VOLTAREN) 1 % GEL Apply 2 g topically 4 (four) times daily as needed (pain).  05/21/18   [provider]  ergocalciferol (  VITAMIN D2) 50000 UNITS capsule Take 50,000 Units by mouth every Monday.     [provider]  feeding supplement, ENSURE ENLIVE, (ENSURE ENLIVE) LIQD Take 237 mLs by mouth 3 (three) times daily between meals. 01/10/19   Mercy Riding, MD  ferrous sulfate 325 (65 FE) MG tablet Take 325 mg by mouth daily with breakfast.    [provider]  fluticasone (FLONASE) 50 MCG/ACT nasal spray Place 2 sprays into the nose daily as needed for allergies.     [provider]  folic acid (FOLVITE) 1 MG tablet Take 1 tablet (1 mg total) by mouth daily. 08/18/15   Hongalgi, Lenis Dickinson, MD  insulin aspart (NOVOLOG) 100 UNIT/ML injection Inject 0-9 Units into the skin 3 (three) times daily with meals. 0-9 Units, Subcutaneous, 3 times daily with meals, First dose (after last reorder) on Sun 11/18/18 at 1130 Correction coverage: Sensitive (thin, NPO, renal) CBG < 70: implement hypoglycemia protocol CBG 70 - 120: 0 units CBG 121 - 150: 1 unit CBG  151 - 200: 2 units CBG 201 - 250: 3 units CBG 251 - 300: 5 units CBG 301 - 350: 7 units CBG 351 - 400: 9 units CBG > 400: call MD and obtain STAT lab verification Patient not taking: Reported on 01/07/2019 11/19/18   Kayleen Memos, DO  insulin glargine (LANTUS) 100 UNIT/ML injection Inject 0.03 mLs (3 Units total) into the skin daily. Patient taking differently: Inject 10 Units into the skin at bedtime.  11/19/18   Kayleen Memos, DO  magnesium oxide (MAG-OX) 400 MG tablet Take 400 mg by mouth daily.    [provider]  metoprolol tartrate (LOPRESSOR) 50 MG tablet Take 1 tablet (50 mg total) by mouth 2 (two) times daily. 09/26/18   Fuller Plan A, MD  montelukast (SINGULAIR) 10 MG tablet Take 10 mg by mouth at bedtime.     [provider]  omeprazole (PRILOSEC) 40 MG capsule Take 40 mg by mouth daily.     [provider]  oxyCODONE (OXY IR/ROXICODONE) 5 MG immediate release tablet Take 1 tablet (5 mg total) by mouth every 6 (six) hours as needed for moderate pain or severe pain. 01/10/19   Mercy Riding, MD  OXYGEN Inhale 2 L into the lungs at bedtime.    [provider]  simvastatin (ZOCOR) 10 MG tablet Take 10 mg by mouth daily.  11/30/14   [provider]  sitaGLIPtin (JANUVIA) 100 MG tablet Take 100 mg by mouth daily.    [provider]  sodium zirconium cyclosilicate (LOKELMA) 5 g packet Take 5 g by mouth daily for 30 days. 01/24/19 02/23/19  Trinidad Curet, MD  thiamine 100 MG tablet Take 1 tablet (100 mg total) by mouth daily. 08/18/15   Hongalgi, Lenis Dickinson, MD  triamcinolone ointment (KENALOG) 0.1 % Apply 1 application topically daily as needed (rash).  05/21/18   [provider]    Family History Family History  Problem Relation Age of Onset   Cancer Brother        heent ca   Cancer Sister        liver ca    Social History Social History   Tobacco Use   Smoking status: Current Some Day Smoker    Packs/day: 0.25    Years:  30.00    Pack years: 7.50    Types: Cigarettes    Last attempt to quit: 11/06/2005    Years since quitting: 13.2  Smokeless tobacco: Never Used  Substance Use Topics   Alcohol use: No    Comment: stopped 2005   Drug use: No     Allergies   Compazine [prochlorperazine edisylate]; Penicillins; Phenergan [promethazine hcl]; Lipitor [atorvastatin]; Chocolate; Lactose intolerance (gi); Other; and Pork-derived products   Review of Systems Review of Systems  Constitutional: Negative for chills and fever.  HENT: Negative for ear pain and sore throat.   Eyes: Negative for pain and visual disturbance.  Respiratory: Negative for cough and shortness of breath.   Cardiovascular: Negative for chest pain and palpitations.  Gastrointestinal: Negative for abdominal pain and vomiting.  Genitourinary: Negative for dysuria and hematuria.  Musculoskeletal: Negative for arthralgias and back pain.  Skin: Negative for color change and rash.  Neurological: Negative for seizures and syncope.  All other systems reviewed and are negative.    Physical Exam Updated Vital Signs BP (!) 174/81    Pulse 83    Temp 98.7 F (37.1 C) (Oral)    Resp 12    SpO2 99%   Physical Exam Vitals signs and nursing note reviewed.  Constitutional:      General: She is not in acute distress.    Appearance: She is well-developed.     Comments: Chronically ill-appearing  HENT:     Head: Normocephalic and atraumatic.  Eyes:     Conjunctiva/sclera: Conjunctivae normal.     Pupils: Pupils are equal, round, and reactive to light.  Neck:     Musculoskeletal: Neck supple.  Cardiovascular:     Rate and Rhythm: Normal rate and regular rhythm.     Heart sounds: No murmur.  Pulmonary:     Effort: Pulmonary effort is normal. No respiratory distress.     Breath sounds: Normal breath sounds.  Abdominal:     Palpations: Abdomen is soft.     Tenderness: There is no abdominal tenderness.  Musculoskeletal: Normal range of  motion.        General: No tenderness.  Skin:    General: Skin is warm and dry.  Neurological:     General: No focal deficit present.     Mental Status: She is alert and oriented to person, place, and time.      ED Treatments / Results  Labs (all labs ordered are listed, but only abnormal results are displayed) Labs Reviewed  CBC WITH DIFFERENTIAL/PLATELET - Abnormal; Notable for the following components:      Result Value   WBC 11.9 (*)    RBC 3.55 (*)    Hemoglobin 8.7 (*)    HCT 28.8 (*)    MCH 24.5 (*)    RDW 17.6 (*)    Platelets 533 (*)    Monocytes Absolute 1.3 (*)    All other components within normal limits  COMPREHENSIVE METABOLIC PANEL - Abnormal; Notable for the following components:   Potassium 5.9 (*)    CO2 20 (*)    Glucose, Bld 112 (*)    Creatinine, Ser 1.72 (*)    Calcium 8.8 (*)    Total Protein 6.2 (*)    Albumin 1.9 (*)    Alkaline Phosphatase 280 (*)    GFR calc non Af Amer 29 (*)    GFR calc Af Amer 33 (*)    All other components within normal limits  BASIC METABOLIC PANEL - Abnormal; Notable for the following components:   Potassium 5.8 (*)    CO2 20 (*)    Creatinine, Ser 1.67 (*)  Calcium 8.7 (*)    GFR calc non Af Amer 30 (*)    GFR calc Af Amer 35 (*)    All other components within normal limits  POCT I-STAT EG7 - Abnormal; Notable for the following components:   pCO2, Ven 36.4 (*)    Acid-base deficit 3.0 (*)    Potassium 6.2 (*)    HCT 24.0 (*)    Hemoglobin 8.2 (*)    All other components within normal limits  TYPE AND SCREEN    EKG None  Radiology No results found.  Procedures Procedures (including critical care time)  Medications Ordered in ED Medications  phenol (CHLORASEPTIC) mouth spray 1 spray (1 spray Mouth/Throat Given 01/24/19 2220)  metoprolol tartrate (LOPRESSOR) tablet 50 mg (50 mg Oral Given 01/24/19 2127)     Initial Impression / Assessment and Plan / ED Course  I have reviewed the triage vital  signs and the nursing notes.  Pertinent labs & imaging results that were available during my care of the patient were reviewed by me and considered in my medical decision making (see chart for details).  73 year old female with history of cirrhosis, former alcohol use disorder, insulin-dependent diabetes, CKD, anemia, COPD on 2 L nasal cannula at baseline, and metastatic cancer presents from her nursing facility due to a low hemoglobin.   I reviewed patient's most recent discharge summary.  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  Oncology was consulted and felt that this was incurable with a grim prognosis of less than 6 months.  Patient was ultimately discharged on 5/14 to an SNF with plans for hospice follow-up.  I called patient's facility and they stated that patient had labs drawn last night and her hemoglobin was found to be 6.5.  She has not been symptomatic.  No fevers, cough, shortness of breath.  No hematochezia or melena.  WBC 11.9, hemoglobin 8.7.  Hemoglobin is 8.3 two weeks ago.  Patient's potassium was 5.9.  Creatinine is 1.72 which is near her baseline.  I-STAT potassium repeated and is 6.2.  Repeat BMP shows potassium of 5.8.  Kidney function is at her baseline.  EKG normal sinus rhythm.  No peak T waves or widened QRS.  No ischemic changes.  Will start lokelma and plan on recheck as outpatient.   No concerns for GI bleeding.  Patient discharged back to her nursing facility in stable condition.  Final Clinical Impressions(s) / ED Diagnoses   Final diagnoses:  Hyperkalemia    ED Discharge Orders         Ordered    sodium zirconium cyclosilicate (LOKELMA) 5 g packet  Daily     01/24/19 2241           Trinidad Curet, MD 01/24/19 2243    Rex Kras, Wenda Overland, MD 01/25/19 1626

## 2019-01-24 NOTE — ED Notes (Signed)
ptar called -gabby

## 2019-01-27 LAB — BPAM RBC
Blood Product Expiration Date: 202006042359
Blood Product Expiration Date: 202006242359
Unit Type and Rh: 5100
Unit Type and Rh: 5100

## 2019-01-27 LAB — TYPE AND SCREEN
ABO/RH(D): O POS
Antibody Screen: POSITIVE
Donor AG Type: NEGATIVE
Donor AG Type: NEGATIVE
Unit division: 0
Unit division: 0

## 2019-04-30 DEATH — deceased

## 2020-08-07 IMAGING — CT CT HEAD W/O CM
3 of 6 series · 13 of 47 positions shown, 15 images · non-contrast
Comparison: Head CT August 14, 2015 and brain MRI August 14, 2015

CLINICAL DATA: Reported blindness right eye

EXAM:
CT HEAD WITHOUT CONTRAST
TECHNIQUE: Contiguous axial images were obtained from the base of the skull
through the vertex without intravenous contrast.

[Series 3: head 5.0 h30s · axial · 0.39mm/px · z∈[-152,-7]mm · 8 of 35 slices shown, 10 images]
[im 3/35  brain]
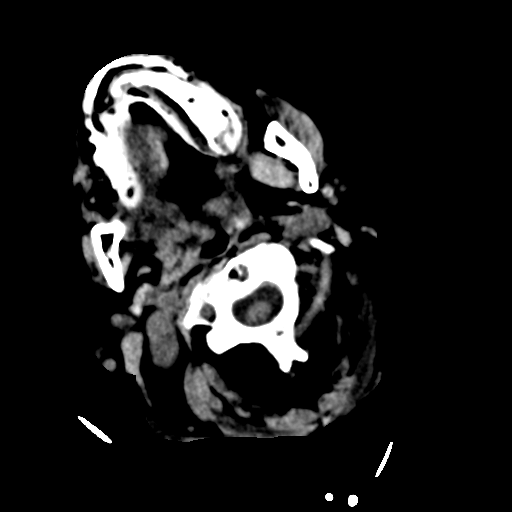
[im 3/35  bone]
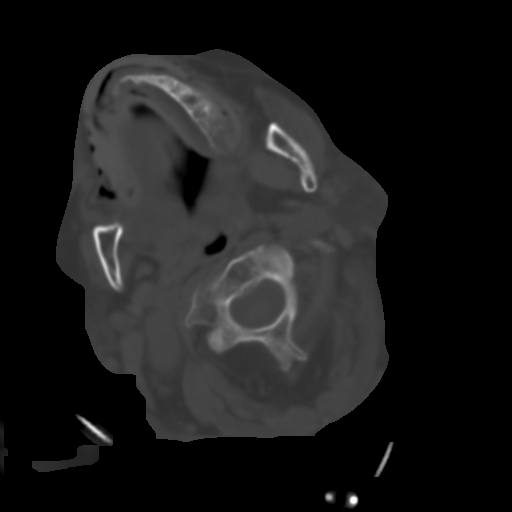
[im 8/35  brain]
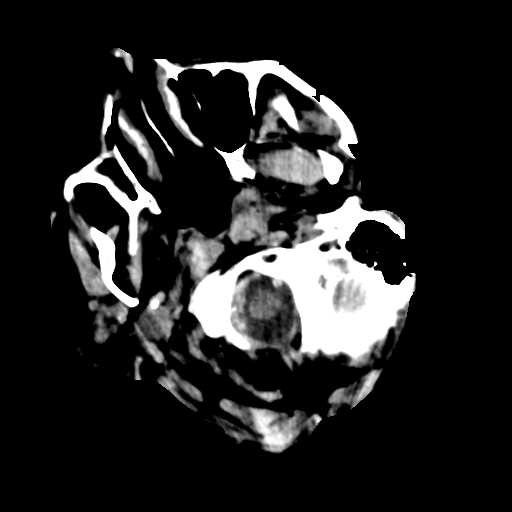
[im 13/35  brain]
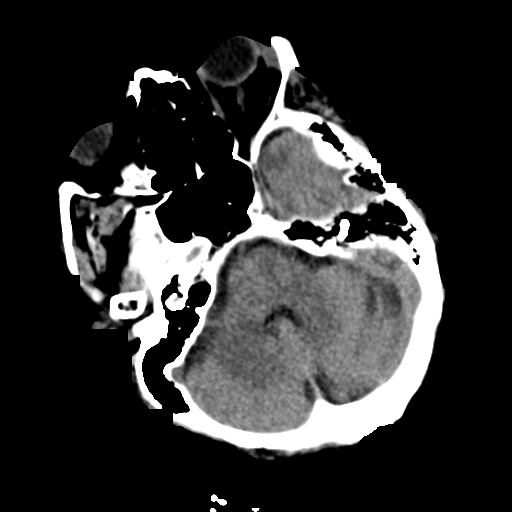
[im 15/35  brain]
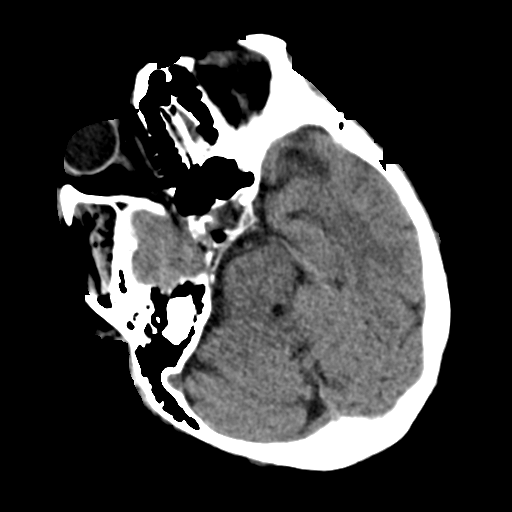
[im 20/35  brain]
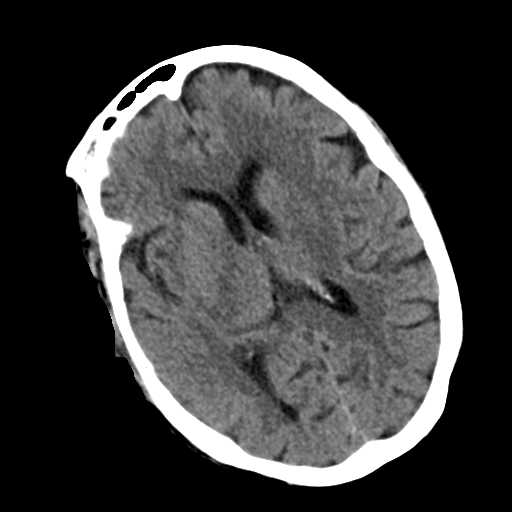
[im 20/35  bone]
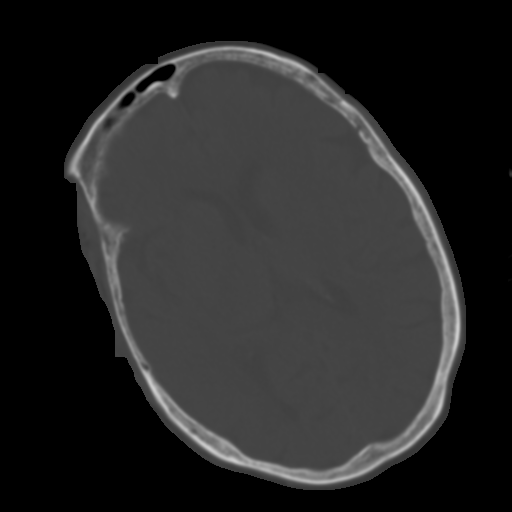
[im 22/35  brain]
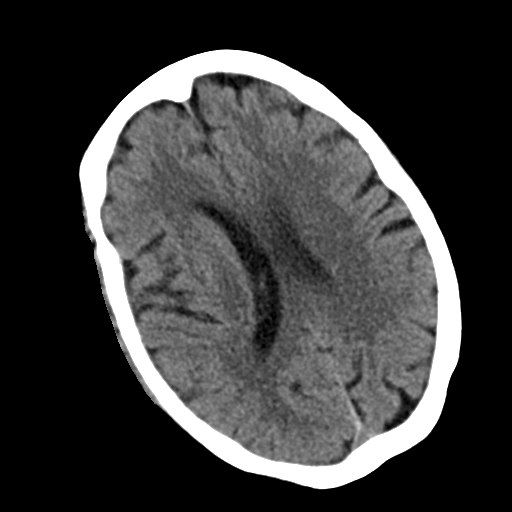
[im 27/35  brain]
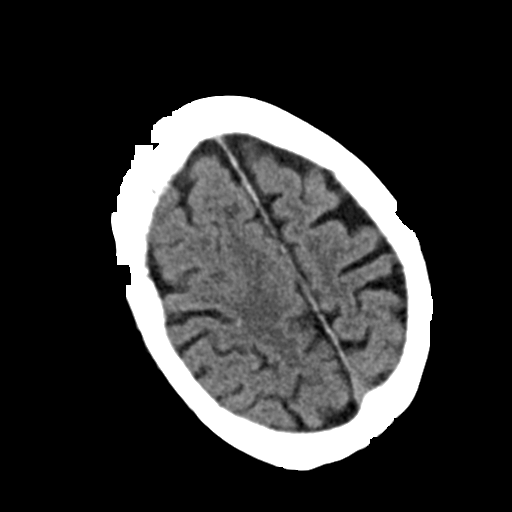
[im 32/35  brain]
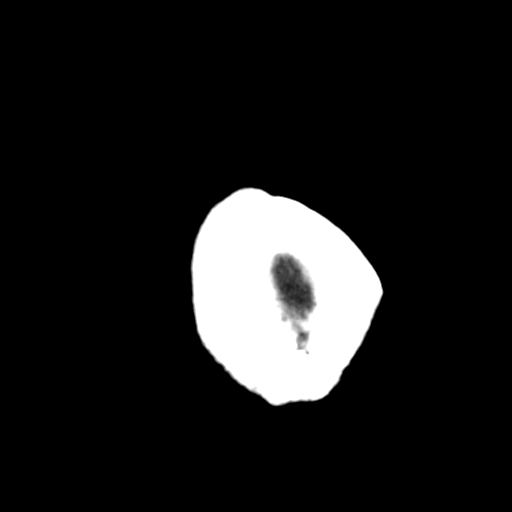

[Series 5: head 3.0 mpr cor · coronal · 0.36mm/px · 3 of 67 slices shown]
[im 17/67  brain]
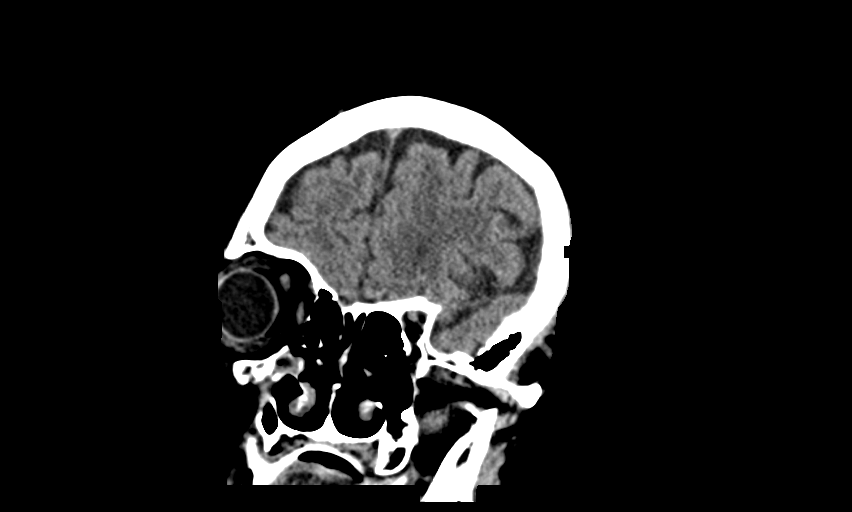
[im 34/67  brain]
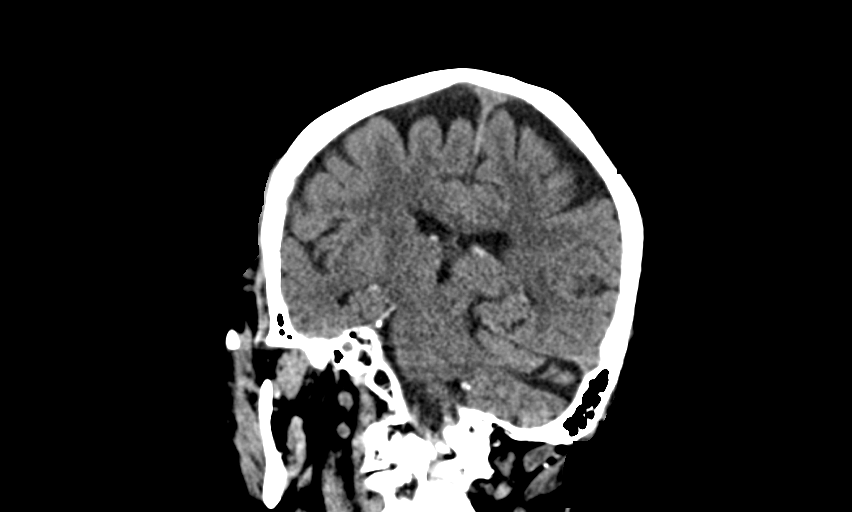
[im 50/67  brain]
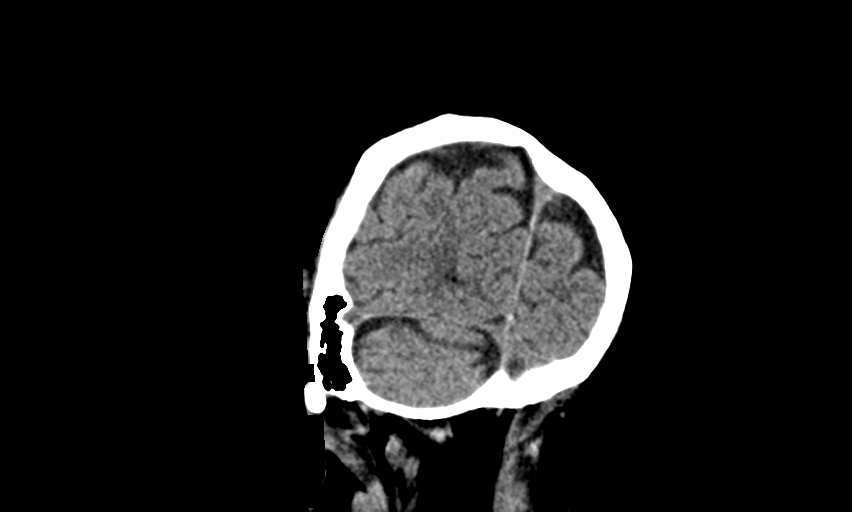

[Series 10: head 3.0 mpr sag · sagittal · 0.36mm/px · 2 of 67 slices shown]
[im 23/67  brain]
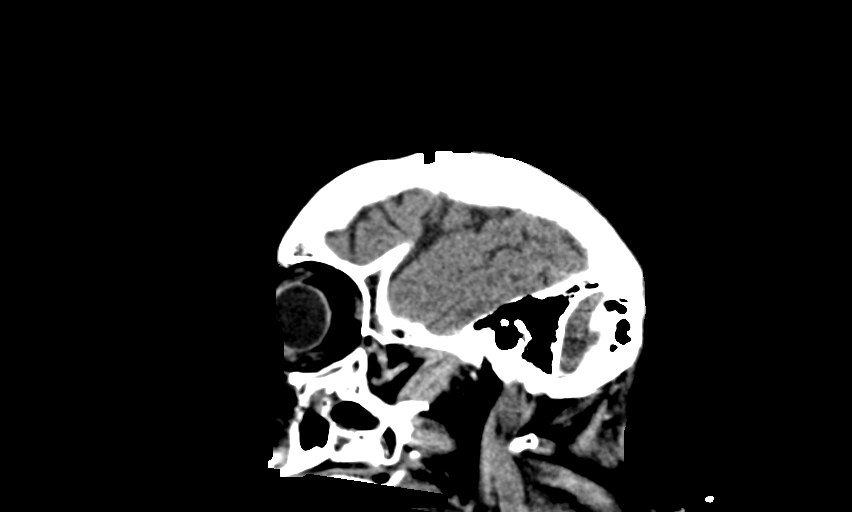
[im 45/67  brain]
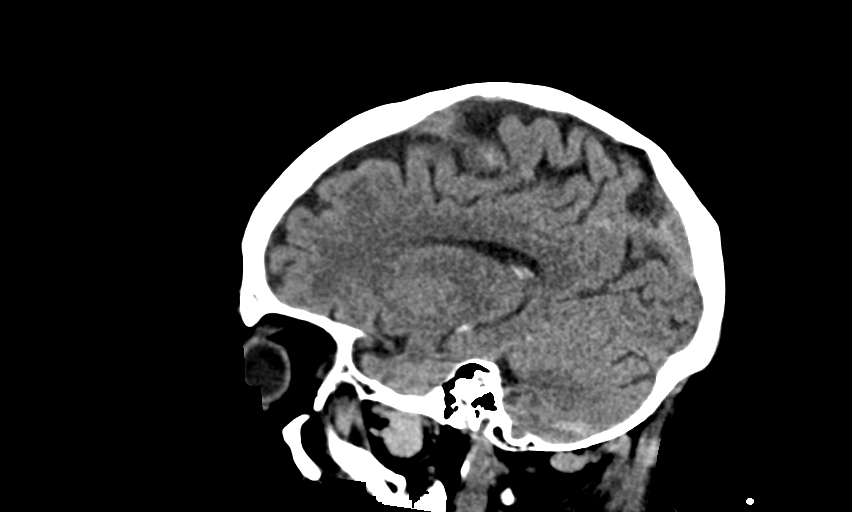

[13 of 47 positions shown; findings below may reference images not displayed]

FINDINGS: Brain: There is mild age related volume loss. There is no
intracranial mass, hemorrhage, extra-axial fluid collection, or
midline shift. There is slight small vessel disease in the centra
semiovale bilaterally. No acute infarct evident.

Vascular: There is no hyperdense vessel. There is calcification in
each carotid siphon region.

Skull: Bony calvarium appears intact, although bones are
osteoporotic.

Sinuses/Orbits: There is mucosal thickening in several ethmoid air
cells. Orbits appear symmetric bilaterally.

Other: Mastoid air cells are clear. There is debris in each external
auditory canal.
IMPRESSION: Age related volume loss with slight periventricular small vessel
disease. No mass or hemorrhage. No evident acute infarct.

Foci of arterial vascular calcification noted. Mucosal thickening in
several ethmoid air cells. Probable cerumen in each external
auditory canal. Bones osteoporotic.
